# Patient Record
Sex: Female | Born: 1955 | Race: White | Hispanic: No | Marital: Married | Smoking: Former smoker
Health system: Southern US, Community
[De-identification: ages and names within clinical notes are randomized; demographics above are authoritative.]

## PROBLEM LIST (undated history)

## (undated) DIAGNOSIS — K759 Inflammatory liver disease, unspecified: Secondary | ICD-10-CM

## (undated) DIAGNOSIS — D490 Neoplasm of unspecified behavior of digestive system: Secondary | ICD-10-CM

## (undated) DIAGNOSIS — J45909 Unspecified asthma, uncomplicated: Secondary | ICD-10-CM

## (undated) DIAGNOSIS — K649 Unspecified hemorrhoids: Secondary | ICD-10-CM

## (undated) DIAGNOSIS — F419 Anxiety disorder, unspecified: Secondary | ICD-10-CM

## (undated) DIAGNOSIS — A78 Q fever: Secondary | ICD-10-CM

## (undated) DIAGNOSIS — I10 Essential (primary) hypertension: Secondary | ICD-10-CM

## (undated) DIAGNOSIS — K219 Gastro-esophageal reflux disease without esophagitis: Secondary | ICD-10-CM

## (undated) DIAGNOSIS — N183 Chronic kidney disease, stage 3 unspecified: Secondary | ICD-10-CM

## (undated) DIAGNOSIS — I493 Ventricular premature depolarization: Secondary | ICD-10-CM

## (undated) DIAGNOSIS — I2510 Atherosclerotic heart disease of native coronary artery without angina pectoris: Secondary | ICD-10-CM

## (undated) DIAGNOSIS — C801 Malignant (primary) neoplasm, unspecified: Secondary | ICD-10-CM

## (undated) DIAGNOSIS — Z9884 Bariatric surgery status: Secondary | ICD-10-CM

## (undated) DIAGNOSIS — D649 Anemia, unspecified: Secondary | ICD-10-CM

## (undated) DIAGNOSIS — E785 Hyperlipidemia, unspecified: Secondary | ICD-10-CM

## (undated) DIAGNOSIS — E039 Hypothyroidism, unspecified: Secondary | ICD-10-CM

## (undated) HISTORY — DX: Neoplasm of unspecified behavior of digestive system: D49.0

## (undated) HISTORY — DX: Unspecified asthma, uncomplicated: J45.909

## (undated) HISTORY — DX: Hyperlipidemia, unspecified: E78.5

## (undated) HISTORY — DX: Unspecified hemorrhoids: K64.9

## (undated) HISTORY — PX: BREAST SURGERY: SHX581

## (undated) HISTORY — PX: TUBAL LIGATION: SHX77

## (undated) HISTORY — PX: APPENDECTOMY: SHX54

## (undated) HISTORY — DX: Hypothyroidism, unspecified: E03.9

## (undated) HISTORY — DX: Essential (primary) hypertension: I10

## (undated) HISTORY — DX: Gastro-esophageal reflux disease without esophagitis: K21.9

## (undated) HISTORY — PX: TONSILLECTOMY: SUR1361

## (undated) HISTORY — DX: Inflammatory liver disease, unspecified: K75.9

## (undated) HISTORY — DX: Q fever: A78

---

## 1988-11-18 HISTORY — PX: ABDOMINAL HYSTERECTOMY: SHX81

## 1990-11-18 HISTORY — PX: CHOLECYSTECTOMY: SHX55

## 1998-09-05 ENCOUNTER — Other Ambulatory Visit: Admission: RE | Admit: 1998-09-05 | Discharge: 1998-09-05 | Payer: Self-pay | Admitting: *Deleted

## 1999-09-12 ENCOUNTER — Other Ambulatory Visit: Admission: RE | Admit: 1999-09-12 | Discharge: 1999-09-12 | Payer: Self-pay | Admitting: *Deleted

## 1999-11-13 ENCOUNTER — Encounter: Payer: Self-pay | Admitting: *Deleted

## 1999-11-13 ENCOUNTER — Encounter: Admission: RE | Admit: 1999-11-13 | Discharge: 1999-11-13 | Payer: Self-pay | Admitting: *Deleted

## 2000-05-13 ENCOUNTER — Encounter: Payer: Self-pay | Admitting: Gastroenterology

## 2000-05-14 ENCOUNTER — Encounter: Payer: Self-pay | Admitting: Gastroenterology

## 2000-05-14 ENCOUNTER — Encounter (INDEPENDENT_AMBULATORY_CARE_PROVIDER_SITE_OTHER): Payer: Self-pay | Admitting: Specialist

## 2000-05-14 ENCOUNTER — Other Ambulatory Visit: Admission: RE | Admit: 2000-05-14 | Discharge: 2000-05-14 | Payer: Self-pay | Admitting: Gastroenterology

## 2000-11-13 ENCOUNTER — Encounter: Admission: RE | Admit: 2000-11-13 | Discharge: 2000-11-13 | Payer: Self-pay | Admitting: *Deleted

## 2000-11-13 ENCOUNTER — Encounter: Payer: Self-pay | Admitting: *Deleted

## 2000-11-24 ENCOUNTER — Encounter: Payer: Self-pay | Admitting: *Deleted

## 2000-11-24 ENCOUNTER — Encounter: Admission: RE | Admit: 2000-11-24 | Discharge: 2000-11-24 | Payer: Self-pay | Admitting: *Deleted

## 2002-06-28 ENCOUNTER — Encounter: Payer: Self-pay | Admitting: Internal Medicine

## 2002-06-28 ENCOUNTER — Encounter: Admission: RE | Admit: 2002-06-28 | Discharge: 2002-06-28 | Payer: Self-pay | Admitting: Internal Medicine

## 2002-09-09 ENCOUNTER — Encounter: Payer: Self-pay | Admitting: Internal Medicine

## 2002-09-09 ENCOUNTER — Encounter: Admission: RE | Admit: 2002-09-09 | Discharge: 2002-09-09 | Payer: Self-pay | Admitting: Internal Medicine

## 2002-11-18 HISTORY — PX: GASTRIC BYPASS: SHX52

## 2003-07-06 ENCOUNTER — Encounter: Payer: Self-pay | Admitting: Gastroenterology

## 2003-07-19 ENCOUNTER — Encounter: Payer: Self-pay | Admitting: Internal Medicine

## 2003-07-19 ENCOUNTER — Encounter: Admission: RE | Admit: 2003-07-19 | Discharge: 2003-07-19 | Payer: Self-pay | Admitting: Internal Medicine

## 2003-11-19 HISTORY — PX: BUNIONECTOMY: SHX129

## 2004-05-24 ENCOUNTER — Encounter: Admission: RE | Admit: 2004-05-24 | Discharge: 2004-05-24 | Payer: Self-pay | Admitting: Internal Medicine

## 2005-07-12 ENCOUNTER — Encounter: Admission: RE | Admit: 2005-07-12 | Discharge: 2005-07-12 | Payer: Self-pay | Admitting: Internal Medicine

## 2006-07-14 ENCOUNTER — Encounter: Admission: RE | Admit: 2006-07-14 | Discharge: 2006-07-14 | Payer: Self-pay | Admitting: Internal Medicine

## 2007-07-21 ENCOUNTER — Encounter: Admission: RE | Admit: 2007-07-21 | Discharge: 2007-07-21 | Payer: Self-pay | Admitting: Internal Medicine

## 2007-11-19 HISTORY — PX: OTHER SURGICAL HISTORY: SHX169

## 2007-12-18 ENCOUNTER — Encounter: Admission: RE | Admit: 2007-12-18 | Discharge: 2007-12-18 | Payer: Self-pay | Admitting: Internal Medicine

## 2008-01-20 ENCOUNTER — Ambulatory Visit: Payer: Self-pay | Admitting: Gastroenterology

## 2008-01-22 ENCOUNTER — Ambulatory Visit (HOSPITAL_COMMUNITY): Admission: RE | Admit: 2008-01-22 | Discharge: 2008-01-22 | Payer: Self-pay | Admitting: Gastroenterology

## 2008-02-02 ENCOUNTER — Inpatient Hospital Stay (HOSPITAL_COMMUNITY): Admission: AD | Admit: 2008-02-02 | Discharge: 2008-02-05 | Payer: Self-pay | Admitting: Gastroenterology

## 2008-02-03 ENCOUNTER — Encounter: Payer: Self-pay | Admitting: Gastroenterology

## 2008-02-04 ENCOUNTER — Ambulatory Visit: Payer: Self-pay | Admitting: Gastroenterology

## 2008-02-22 ENCOUNTER — Encounter: Payer: Self-pay | Admitting: Gastroenterology

## 2008-02-29 ENCOUNTER — Encounter: Payer: Self-pay | Admitting: Gastroenterology

## 2008-03-16 ENCOUNTER — Ambulatory Visit: Payer: Self-pay | Admitting: Hematology and Oncology

## 2008-03-17 ENCOUNTER — Encounter: Payer: Self-pay | Admitting: Gastroenterology

## 2008-03-17 LAB — CBC WITH DIFFERENTIAL/PLATELET
BASO%: 0.5 % (ref 0.0–2.0)
EOS%: 2.8 % (ref 0.0–7.0)
MCH: 30.8 pg (ref 26.0–34.0)
MCHC: 34.2 g/dL (ref 32.0–36.0)
MONO#: 0.6 10*3/uL (ref 0.1–0.9)
RBC: 3.25 10*6/uL — ABNORMAL LOW (ref 3.70–5.32)
RDW: 15.6 % — ABNORMAL HIGH (ref 11.3–14.5)
WBC: 8.2 10*3/uL (ref 3.9–10.0)
lymph#: 1.6 10*3/uL (ref 0.9–3.3)

## 2008-03-17 LAB — COMPREHENSIVE METABOLIC PANEL
ALT: 9 U/L (ref 0–35)
AST: 12 U/L (ref 0–37)
Calcium: 8.5 mg/dL (ref 8.4–10.5)
Chloride: 105 mEq/L (ref 96–112)
Creatinine, Ser: 0.68 mg/dL (ref 0.40–1.20)
Potassium: 3.6 mEq/L (ref 3.5–5.3)
Sodium: 141 mEq/L (ref 135–145)
Total Protein: 6.2 g/dL (ref 6.0–8.3)

## 2008-03-17 LAB — CANCER ANTIGEN 19-9: CA 19-9: 20.1 U/mL (ref <35.0)

## 2008-03-24 ENCOUNTER — Encounter: Payer: Self-pay | Admitting: Gastroenterology

## 2008-03-30 ENCOUNTER — Ambulatory Visit: Admission: RE | Admit: 2008-03-30 | Discharge: 2008-05-29 | Payer: Self-pay | Admitting: Radiation Oncology

## 2008-03-31 ENCOUNTER — Encounter: Payer: Self-pay | Admitting: Gastroenterology

## 2008-04-07 ENCOUNTER — Ambulatory Visit (HOSPITAL_COMMUNITY): Admission: RE | Admit: 2008-04-07 | Discharge: 2008-04-07 | Payer: Self-pay | Admitting: Hematology and Oncology

## 2008-04-13 ENCOUNTER — Encounter: Payer: Self-pay | Admitting: Gastroenterology

## 2008-04-19 ENCOUNTER — Encounter: Payer: Self-pay | Admitting: Gastroenterology

## 2008-04-26 ENCOUNTER — Encounter: Payer: Self-pay | Admitting: Gastroenterology

## 2008-04-26 LAB — CBC WITH DIFFERENTIAL/PLATELET
BASO%: 0.9 % (ref 0.0–2.0)
LYMPH%: 11.7 % — ABNORMAL LOW (ref 14.0–48.0)
MCHC: 35 g/dL (ref 32.0–36.0)
MONO#: 0.6 10*3/uL (ref 0.1–0.9)
RBC: 4.08 10*6/uL (ref 3.70–5.32)
RDW: 12.3 % (ref 11.3–14.5)
WBC: 6.1 10*3/uL (ref 3.9–10.0)
lymph#: 0.7 10*3/uL — ABNORMAL LOW (ref 0.9–3.3)

## 2008-04-26 LAB — BASIC METABOLIC PANEL
CO2: 25 mEq/L (ref 19–32)
Calcium: 7.2 mg/dL — ABNORMAL LOW (ref 8.4–10.5)
Chloride: 108 mEq/L (ref 96–112)
Potassium: 3.1 mEq/L — ABNORMAL LOW (ref 3.5–5.3)
Sodium: 150 mEq/L — ABNORMAL HIGH (ref 135–145)

## 2008-05-03 ENCOUNTER — Ambulatory Visit: Payer: Self-pay | Admitting: Hematology and Oncology

## 2008-05-03 LAB — CBC WITH DIFFERENTIAL/PLATELET
Basophils Absolute: 0 10*3/uL (ref 0.0–0.1)
Eosinophils Absolute: 0.5 10*3/uL (ref 0.0–0.5)
HCT: 34.2 % — ABNORMAL LOW (ref 34.8–46.6)
HGB: 12.1 g/dL (ref 11.6–15.9)
LYMPH%: 10.1 % — ABNORMAL LOW (ref 14.0–48.0)
MCV: 87.1 fL (ref 81.0–101.0)
MONO#: 0.6 10*3/uL (ref 0.1–0.9)
MONO%: 12.8 % (ref 0.0–13.0)
NEUT#: 3 10*3/uL (ref 1.5–6.5)
Platelets: 192 10*3/uL (ref 145–400)
RBC: 3.92 10*6/uL (ref 3.70–5.32)
WBC: 4.5 10*3/uL (ref 3.9–10.0)

## 2008-05-10 ENCOUNTER — Encounter: Payer: Self-pay | Admitting: Gastroenterology

## 2008-05-10 LAB — CBC WITH DIFFERENTIAL/PLATELET
BASO%: 0.6 % (ref 0.0–2.0)
Eosinophils Absolute: 0.5 10*3/uL (ref 0.0–0.5)
HCT: 38.8 % (ref 34.8–46.6)
LYMPH%: 7.6 % — ABNORMAL LOW (ref 14.0–48.0)
MCHC: 34.9 g/dL (ref 32.0–36.0)
MCV: 87.2 fL (ref 81.0–101.0)
MONO#: 0.9 10*3/uL (ref 0.1–0.9)
MONO%: 18.1 % — ABNORMAL HIGH (ref 0.0–13.0)
NEUT%: 63.1 % (ref 39.6–76.8)
Platelets: 145 10*3/uL (ref 145–400)
RBC: 4.45 10*6/uL (ref 3.70–5.32)
WBC: 4.9 10*3/uL (ref 3.9–10.0)

## 2008-05-17 LAB — CBC WITH DIFFERENTIAL/PLATELET
BASO%: 0.4 % (ref 0.0–2.0)
EOS%: 8.9 % — ABNORMAL HIGH (ref 0.0–7.0)
HCT: 33 % — ABNORMAL LOW (ref 34.8–46.6)
LYMPH%: 6.4 % — ABNORMAL LOW (ref 14.0–48.0)
MCH: 30.8 pg (ref 26.0–34.0)
MCHC: 35.8 g/dL (ref 32.0–36.0)
NEUT%: 66.3 % (ref 39.6–76.8)
Platelets: 169 10*3/uL (ref 145–400)
RBC: 3.84 10*6/uL (ref 3.70–5.32)

## 2008-06-06 ENCOUNTER — Ambulatory Visit: Payer: Self-pay | Admitting: Hematology and Oncology

## 2008-06-08 ENCOUNTER — Ambulatory Visit (HOSPITAL_COMMUNITY): Admission: RE | Admit: 2008-06-08 | Discharge: 2008-06-08 | Payer: Self-pay | Admitting: Hematology and Oncology

## 2008-06-09 ENCOUNTER — Encounter: Payer: Self-pay | Admitting: Gastroenterology

## 2008-06-09 LAB — CBC WITH DIFFERENTIAL/PLATELET
Basophils Absolute: 0 10*3/uL (ref 0.0–0.1)
Eosinophils Absolute: 0.2 10*3/uL (ref 0.0–0.5)
HGB: 12.8 g/dL (ref 11.6–15.9)
LYMPH%: 16 % (ref 14.0–48.0)
MONO#: 0.5 10*3/uL (ref 0.1–0.9)
NEUT#: 2.5 10*3/uL (ref 1.5–6.5)
Platelets: 211 10*3/uL (ref 145–400)
RBC: 4.21 10*6/uL (ref 3.70–5.32)
RDW: 14.6 % — ABNORMAL HIGH (ref 11.3–14.5)
WBC: 3.8 10*3/uL — ABNORMAL LOW (ref 3.9–10.0)

## 2008-06-09 LAB — COMPREHENSIVE METABOLIC PANEL
Albumin: 4 g/dL (ref 3.5–5.2)
BUN: 9 mg/dL (ref 6–23)
CO2: 26 mEq/L (ref 19–32)
Calcium: 9 mg/dL (ref 8.4–10.5)
Glucose, Bld: 157 mg/dL — ABNORMAL HIGH (ref 70–99)
Potassium: 4 mEq/L (ref 3.5–5.3)
Sodium: 143 mEq/L (ref 135–145)
Total Protein: 6.1 g/dL (ref 6.0–8.3)

## 2008-06-09 LAB — CANCER ANTIGEN 19-9: CA 19-9: 16.7 U/mL (ref <35.0)

## 2008-06-21 ENCOUNTER — Encounter: Payer: Self-pay | Admitting: Gastroenterology

## 2008-06-22 LAB — CBC WITH DIFFERENTIAL/PLATELET
BASO%: 0.5 % (ref 0.0–2.0)
EOS%: 0.9 % (ref 0.0–7.0)
MCH: 31.5 pg (ref 26.0–34.0)
MCHC: 35.7 g/dL (ref 32.0–36.0)
MONO#: 0.2 10*3/uL (ref 0.1–0.9)
NEUT%: 69.2 % (ref 39.6–76.8)
RBC: 3.18 10*6/uL — ABNORMAL LOW (ref 3.70–5.32)
RDW: 14.8 % — ABNORMAL HIGH (ref 11.3–14.5)
WBC: 2.4 10*3/uL — ABNORMAL LOW (ref 3.9–10.0)
lymph#: 0.5 10*3/uL — ABNORMAL LOW (ref 0.9–3.3)

## 2008-07-06 LAB — CBC WITH DIFFERENTIAL/PLATELET
BASO%: 1.5 % (ref 0.0–2.0)
Basophils Absolute: 0.1 10e3/uL (ref 0.0–0.1)
EOS%: 2.3 % (ref 0.0–7.0)
Eosinophils Absolute: 0.1 10e3/uL (ref 0.0–0.5)
HCT: 31.2 % — ABNORMAL LOW (ref 34.8–46.6)
HGB: 11 g/dL — ABNORMAL LOW (ref 11.6–15.9)
LYMPH%: 15.6 % (ref 14.0–48.0)
MCH: 31.4 pg (ref 26.0–34.0)
MCHC: 35.2 g/dL (ref 32.0–36.0)
MCV: 89 fL (ref 81.0–101.0)
MONO#: 0.7 10e3/uL (ref 0.1–0.9)
MONO%: 18.1 % — ABNORMAL HIGH (ref 0.0–13.0)
NEUT#: 2.3 10e3/uL (ref 1.5–6.5)
NEUT%: 62.4 % (ref 39.6–76.8)
Platelets: 389 10e3/uL (ref 145–400)
RBC: 3.5 10e6/uL — ABNORMAL LOW (ref 3.70–5.32)
RDW: 15.4 % — ABNORMAL HIGH (ref 11.3–14.5)
WBC: 3.7 10e3/uL — ABNORMAL LOW (ref 3.9–10.0)
lymph#: 0.6 10e3/uL — ABNORMAL LOW (ref 0.9–3.3)

## 2008-07-13 ENCOUNTER — Encounter: Payer: Self-pay | Admitting: Gastroenterology

## 2008-07-13 LAB — CBC WITH DIFFERENTIAL/PLATELET
Basophils Absolute: 0 10*3/uL (ref 0.0–0.1)
Eosinophils Absolute: 0 10*3/uL (ref 0.0–0.5)
HCT: 29.1 % — ABNORMAL LOW (ref 34.8–46.6)
HGB: 10.3 g/dL — ABNORMAL LOW (ref 11.6–15.9)
MCV: 92.2 fL (ref 81.0–101.0)
MONO%: 19.1 % — ABNORMAL HIGH (ref 0.0–13.0)
NEUT#: 0.6 10*3/uL — ABNORMAL LOW (ref 1.5–6.5)
NEUT%: 41.4 % (ref 39.6–76.8)
RDW: 15.5 % — ABNORMAL HIGH (ref 11.3–14.5)
lymph#: 0.5 10*3/uL — ABNORMAL LOW (ref 0.9–3.3)

## 2008-07-13 LAB — COMPREHENSIVE METABOLIC PANEL WITH GFR
ALT: 30 U/L (ref 0–35)
AST: 36 U/L (ref 0–37)
Albumin: 3.6 g/dL (ref 3.5–5.2)
Alkaline Phosphatase: 126 U/L — ABNORMAL HIGH (ref 39–117)
BUN: 8 mg/dL (ref 6–23)
CO2: 24 meq/L (ref 19–32)
Calcium: 9 mg/dL (ref 8.4–10.5)
Chloride: 106 meq/L (ref 96–112)
Creatinine, Ser: 0.73 mg/dL (ref 0.40–1.20)
Glucose, Bld: 169 mg/dL — ABNORMAL HIGH (ref 70–99)
Potassium: 3.3 meq/L — ABNORMAL LOW (ref 3.5–5.3)
Sodium: 140 meq/L (ref 135–145)
Total Bilirubin: 0.3 mg/dL (ref 0.3–1.2)
Total Protein: 6.1 g/dL (ref 6.0–8.3)

## 2008-07-20 LAB — CBC WITH DIFFERENTIAL/PLATELET
Basophils Absolute: 0.1 10*3/uL (ref 0.0–0.1)
EOS%: 1.3 % (ref 0.0–7.0)
Eosinophils Absolute: 0.1 10*3/uL (ref 0.0–0.5)
HCT: 32.4 % — ABNORMAL LOW (ref 34.8–46.6)
HGB: 11.7 g/dL (ref 11.6–15.9)
MCH: 32.6 pg (ref 26.0–34.0)
MCV: 90.2 fL (ref 81.0–101.0)
MONO%: 10.7 % (ref 0.0–13.0)
NEUT%: 74 % (ref 39.6–76.8)
Platelets: 209 10*3/uL (ref 145–400)

## 2008-07-21 ENCOUNTER — Ambulatory Visit: Payer: Self-pay | Admitting: Hematology and Oncology

## 2008-07-27 LAB — CBC WITH DIFFERENTIAL/PLATELET
Basophils Absolute: 0 10*3/uL (ref 0.0–0.1)
EOS%: 1.4 % (ref 0.0–7.0)
LYMPH%: 24.4 % (ref 14.0–48.0)
MCH: 32 pg (ref 26.0–34.0)
MCV: 89.5 fL (ref 81.0–101.0)
MONO%: 10.1 % (ref 0.0–13.0)
Platelets: 189 10*3/uL (ref 145–400)
RBC: 3.44 10*6/uL — ABNORMAL LOW (ref 3.70–5.32)
RDW: 13.3 % (ref 11.3–14.5)

## 2008-08-03 LAB — CBC WITH DIFFERENTIAL/PLATELET
BASO%: 2.3 % — ABNORMAL HIGH (ref 0.0–2.0)
Eosinophils Absolute: 0 10*3/uL (ref 0.0–0.5)
LYMPH%: 10.1 % — ABNORMAL LOW (ref 14.0–48.0)
MCHC: 35.7 g/dL (ref 32.0–36.0)
MCV: 90.8 fL (ref 81.0–101.0)
MONO%: 10.2 % (ref 0.0–13.0)
NEUT#: 4.9 10*3/uL (ref 1.5–6.5)
RBC: 3.02 10*6/uL — ABNORMAL LOW (ref 3.70–5.32)
RDW: 14.1 % (ref 11.3–14.5)
WBC: 6.4 10*3/uL (ref 3.9–10.0)

## 2008-08-03 LAB — CANCER ANTIGEN 19-9: CA 19-9: 18.2 U/mL (ref <35.0)

## 2008-08-10 LAB — BASIC METABOLIC PANEL
CO2: 25 mEq/L (ref 19–32)
Chloride: 105 mEq/L (ref 96–112)
Creatinine, Ser: 0.85 mg/dL (ref 0.40–1.20)
Potassium: 3.5 mEq/L (ref 3.5–5.3)
Sodium: 138 mEq/L (ref 135–145)

## 2008-08-10 LAB — CBC WITH DIFFERENTIAL/PLATELET
BASO%: 0 % (ref 0.0–2.0)
EOS%: 0 % (ref 0.0–7.0)
HCT: 25.9 % — ABNORMAL LOW (ref 34.8–46.6)
MCH: 33.2 pg (ref 26.0–34.0)
MCHC: 35 g/dL (ref 32.0–36.0)
MONO#: 0.4 10*3/uL (ref 0.1–0.9)
RBC: 2.74 10*6/uL — ABNORMAL LOW (ref 3.70–5.32)
RDW: 15.5 % — ABNORMAL HIGH (ref 11.3–14.5)
WBC: 9.6 10*3/uL (ref 3.9–10.0)
lymph#: 0.1 10*3/uL — ABNORMAL LOW (ref 0.9–3.3)

## 2008-08-11 ENCOUNTER — Encounter: Admission: RE | Admit: 2008-08-11 | Discharge: 2008-08-11 | Payer: Self-pay | Admitting: Internal Medicine

## 2008-08-17 ENCOUNTER — Encounter: Payer: Self-pay | Admitting: Gastroenterology

## 2008-08-17 LAB — CBC WITH DIFFERENTIAL/PLATELET
BASO%: 2.1 % — ABNORMAL HIGH (ref 0.0–2.0)
Basophils Absolute: 0.2 10*3/uL — ABNORMAL HIGH (ref 0.0–0.1)
EOS%: 1.4 % (ref 0.0–7.0)
HCT: 27.8 % — ABNORMAL LOW (ref 34.8–46.6)
HGB: 10 g/dL — ABNORMAL LOW (ref 11.6–15.9)
MCH: 32.9 pg (ref 26.0–34.0)
MCHC: 36.1 g/dL — ABNORMAL HIGH (ref 32.0–36.0)
MONO#: 0.9 10*3/uL (ref 0.1–0.9)
NEUT%: 76.4 % (ref 39.6–76.8)
RDW: 16 % — ABNORMAL HIGH (ref 11.3–14.5)
WBC: 7.3 10*3/uL (ref 3.9–10.0)
lymph#: 0.6 10*3/uL — ABNORMAL LOW (ref 0.9–3.3)

## 2008-08-17 LAB — COMPREHENSIVE METABOLIC PANEL
ALT: 17 U/L (ref 0–35)
AST: 27 U/L (ref 0–37)
Albumin: 3.4 g/dL — ABNORMAL LOW (ref 3.5–5.2)
CO2: 23 mEq/L (ref 19–32)
Calcium: 8.4 mg/dL (ref 8.4–10.5)
Chloride: 108 mEq/L (ref 96–112)
Creatinine, Ser: 0.67 mg/dL (ref 0.40–1.20)
Potassium: 3.8 mEq/L (ref 3.5–5.3)
Total Protein: 5.5 g/dL — ABNORMAL LOW (ref 6.0–8.3)

## 2008-08-24 LAB — CBC WITH DIFFERENTIAL/PLATELET
Basophils Absolute: 0.1 10*3/uL (ref 0.0–0.1)
EOS%: 0.3 % (ref 0.0–7.0)
Eosinophils Absolute: 0 10*3/uL (ref 0.0–0.5)
HCT: 29.7 % — ABNORMAL LOW (ref 34.8–46.6)
HGB: 10.6 g/dL — ABNORMAL LOW (ref 11.6–15.9)
MCH: 32.9 pg (ref 26.0–34.0)
MCV: 92.5 fL (ref 81.0–101.0)
MONO%: 14 % — ABNORMAL HIGH (ref 0.0–13.0)
NEUT#: 2.2 10*3/uL (ref 1.5–6.5)
NEUT%: 70.6 % (ref 39.6–76.8)
Platelets: 401 10*3/uL — ABNORMAL HIGH (ref 145–400)
RDW: 14.7 % — ABNORMAL HIGH (ref 11.3–14.5)

## 2008-08-30 ENCOUNTER — Encounter: Payer: Self-pay | Admitting: Gastroenterology

## 2008-08-30 LAB — CBC WITH DIFFERENTIAL/PLATELET
Basophils Absolute: 0 10*3/uL (ref 0.0–0.1)
Eosinophils Absolute: 0.1 10*3/uL (ref 0.0–0.5)
HCT: 31.5 % — ABNORMAL LOW (ref 34.8–46.6)
LYMPH%: 2.2 % — ABNORMAL LOW (ref 14.0–48.0)
MONO#: 1.4 10*3/uL — ABNORMAL HIGH (ref 0.1–0.9)
NEUT#: 24 10*3/uL — ABNORMAL HIGH (ref 1.5–6.5)
NEUT%: 92 % — ABNORMAL HIGH (ref 39.6–76.8)
Platelets: 216 10*3/uL (ref 145–400)
WBC: 26.1 10*3/uL — ABNORMAL HIGH (ref 3.9–10.0)

## 2008-08-30 LAB — COMPREHENSIVE METABOLIC PANEL
BUN: 8 mg/dL (ref 6–23)
CO2: 26 mEq/L (ref 19–32)
Creatinine, Ser: 0.92 mg/dL (ref 0.40–1.20)
Glucose, Bld: 154 mg/dL — ABNORMAL HIGH (ref 70–99)
Sodium: 140 mEq/L (ref 135–145)
Total Bilirubin: 0.4 mg/dL (ref 0.3–1.2)
Total Protein: 5.8 g/dL — ABNORMAL LOW (ref 6.0–8.3)

## 2008-09-08 ENCOUNTER — Ambulatory Visit: Payer: Self-pay | Admitting: Hematology and Oncology

## 2008-09-12 ENCOUNTER — Ambulatory Visit (HOSPITAL_COMMUNITY): Admission: RE | Admit: 2008-09-12 | Discharge: 2008-09-12 | Payer: Self-pay | Admitting: Hematology and Oncology

## 2008-09-12 LAB — COMPREHENSIVE METABOLIC PANEL
Albumin: 3.4 g/dL — ABNORMAL LOW (ref 3.5–5.2)
CO2: 28 mEq/L (ref 19–32)
Calcium: 8.6 mg/dL (ref 8.4–10.5)
Glucose, Bld: 143 mg/dL — ABNORMAL HIGH (ref 70–99)
Potassium: 3.6 mEq/L (ref 3.5–5.3)
Sodium: 137 mEq/L (ref 135–145)
Total Bilirubin: 0.7 mg/dL (ref 0.3–1.2)
Total Protein: 6.4 g/dL (ref 6.0–8.3)

## 2008-09-12 LAB — CBC WITH DIFFERENTIAL/PLATELET
Eosinophils Absolute: 0.2 10*3/uL (ref 0.0–0.5)
LYMPH%: 5.5 % — ABNORMAL LOW (ref 14.0–48.0)
MONO#: 1.1 10*3/uL — ABNORMAL HIGH (ref 0.1–0.9)
NEUT#: 4.1 10*3/uL (ref 1.5–6.5)
Platelets: 382 10*3/uL (ref 145–400)
RBC: 3.08 10*6/uL — ABNORMAL LOW (ref 3.70–5.32)
WBC: 5.8 10*3/uL (ref 3.9–10.0)

## 2008-09-12 LAB — CANCER ANTIGEN 19-9: CA 19-9: 15.6 U/mL (ref <35.0)

## 2008-09-15 ENCOUNTER — Encounter: Payer: Self-pay | Admitting: Gastroenterology

## 2008-11-07 ENCOUNTER — Ambulatory Visit: Payer: Self-pay | Admitting: Hematology and Oncology

## 2008-11-09 ENCOUNTER — Ambulatory Visit (HOSPITAL_COMMUNITY): Admission: RE | Admit: 2008-11-09 | Discharge: 2008-11-09 | Payer: Self-pay | Admitting: Hematology and Oncology

## 2008-11-09 LAB — COMPREHENSIVE METABOLIC PANEL
ALT: 54 U/L — ABNORMAL HIGH (ref 0–35)
AST: 136 U/L — ABNORMAL HIGH (ref 0–37)
Alkaline Phosphatase: 266 U/L — ABNORMAL HIGH (ref 39–117)
BUN: 5 mg/dL — ABNORMAL LOW (ref 6–23)
Calcium: 8.7 mg/dL (ref 8.4–10.5)
Chloride: 103 mEq/L (ref 96–112)
Creatinine, Ser: 0.68 mg/dL (ref 0.40–1.20)
Total Bilirubin: 0.6 mg/dL (ref 0.3–1.2)

## 2008-11-09 LAB — CBC WITH DIFFERENTIAL/PLATELET
BASO%: 0 % (ref 0.0–2.0)
Basophils Absolute: 0 10*3/uL (ref 0.0–0.1)
EOS%: 0.2 % (ref 0.0–7.0)
HCT: 39.7 % (ref 34.8–46.6)
HGB: 13.5 g/dL (ref 11.6–15.9)
LYMPH%: 3.4 % — ABNORMAL LOW (ref 14.0–48.0)
MCH: 31 pg (ref 26.0–34.0)
MCHC: 34.1 g/dL (ref 32.0–36.0)
MCV: 90.8 fL (ref 81.0–101.0)
MONO%: 1.5 % (ref 0.0–13.0)
NEUT%: 94.9 % — ABNORMAL HIGH (ref 39.6–76.8)
lymph#: 0.3 10*3/uL — ABNORMAL LOW (ref 0.9–3.3)

## 2009-01-04 ENCOUNTER — Ambulatory Visit: Payer: Self-pay | Admitting: Hematology and Oncology

## 2009-01-18 ENCOUNTER — Encounter: Admission: RE | Admit: 2009-01-18 | Discharge: 2009-01-18 | Payer: Self-pay | Admitting: Hematology and Oncology

## 2009-01-18 LAB — COMPREHENSIVE METABOLIC PANEL
AST: 38 U/L — ABNORMAL HIGH (ref 0–37)
BUN: 4 mg/dL — ABNORMAL LOW (ref 6–23)
Calcium: 8.2 mg/dL — ABNORMAL LOW (ref 8.4–10.5)
Chloride: 100 mEq/L (ref 96–112)
Creatinine, Ser: 0.89 mg/dL (ref 0.40–1.20)

## 2009-01-18 LAB — CBC WITH DIFFERENTIAL/PLATELET
BASO%: 0.1 % (ref 0.0–2.0)
EOS%: 2.7 % (ref 0.0–7.0)
HCT: 34 % — ABNORMAL LOW (ref 34.8–46.6)
MCH: 30.4 pg (ref 25.1–34.0)
MCHC: 34.2 g/dL (ref 31.5–36.0)
NEUT%: 78.3 % — ABNORMAL HIGH (ref 38.4–76.8)
lymph#: 0.5 10*3/uL — ABNORMAL LOW (ref 0.9–3.3)

## 2009-01-21 LAB — CANCER ANTIGEN 19-9: CA 19-9: 7441.2 U/mL — ABNORMAL HIGH (ref <35.0)

## 2009-01-25 LAB — CANCER ANTIGEN 19-9: CA 19-9: 1608.3 U/mL — ABNORMAL HIGH (ref <35.0)

## 2009-01-30 LAB — CANCER ANTIGEN 19-9: CA 19-9: 364.6 U/mL — ABNORMAL HIGH (ref <35.0)

## 2009-02-15 LAB — CANCER ANTIGEN 19-9: CA 19-9: 216.2 U/mL — ABNORMAL HIGH (ref <35.0)

## 2009-02-17 ENCOUNTER — Ambulatory Visit: Payer: Self-pay | Admitting: Hematology and Oncology

## 2009-02-17 LAB — CBC WITH DIFFERENTIAL/PLATELET
Basophils Absolute: 0 10*3/uL (ref 0.0–0.1)
Eosinophils Absolute: 0.1 10*3/uL (ref 0.0–0.5)
HCT: 34 % — ABNORMAL LOW (ref 34.8–46.6)
HGB: 11.6 g/dL (ref 11.6–15.9)
LYMPH%: 8.1 % — ABNORMAL LOW (ref 14.0–49.7)
MCV: 88.6 fL (ref 79.5–101.0)
MONO#: 0.7 10*3/uL (ref 0.1–0.9)
MONO%: 9.6 % (ref 0.0–14.0)
NEUT#: 5.9 10*3/uL (ref 1.5–6.5)
NEUT%: 81.5 % — ABNORMAL HIGH (ref 38.4–76.8)
Platelets: 231 10*3/uL (ref 145–400)
RBC: 3.83 10*6/uL (ref 3.70–5.45)
WBC: 7.2 10*3/uL (ref 3.9–10.3)

## 2009-02-17 LAB — BASIC METABOLIC PANEL
BUN: 13 mg/dL (ref 6–23)
CO2: 29 mEq/L (ref 19–32)
Calcium: 8.6 mg/dL (ref 8.4–10.5)
Glucose, Bld: 282 mg/dL — ABNORMAL HIGH (ref 70–99)
Sodium: 137 mEq/L (ref 135–145)

## 2009-02-23 LAB — CBC WITH DIFFERENTIAL/PLATELET
BASO%: 0.2 % (ref 0.0–2.0)
EOS%: 0.2 % (ref 0.0–7.0)
HCT: 32.3 % — ABNORMAL LOW (ref 34.8–46.6)
LYMPH%: 12.1 % — ABNORMAL LOW (ref 14.0–49.7)
MCH: 29.2 pg (ref 25.1–34.0)
MCHC: 33.7 g/dL (ref 31.5–36.0)
NEUT%: 77.6 % — ABNORMAL HIGH (ref 38.4–76.8)
Platelets: 198 10*3/uL (ref 145–400)
RBC: 3.73 10*6/uL (ref 3.70–5.45)
WBC: 5 10*3/uL (ref 3.9–10.3)
lymph#: 0.6 10*3/uL — ABNORMAL LOW (ref 0.9–3.3)

## 2009-03-02 LAB — CBC WITH DIFFERENTIAL/PLATELET
BASO%: 0.4 % (ref 0.0–2.0)
Basophils Absolute: 0 10*3/uL (ref 0.0–0.1)
HCT: 30.1 % — ABNORMAL LOW (ref 34.8–46.6)
HGB: 9.9 g/dL — ABNORMAL LOW (ref 11.6–15.9)
MCHC: 32.9 g/dL (ref 31.5–36.0)
MONO#: 0.4 10*3/uL (ref 0.1–0.9)
NEUT%: 58 % (ref 38.4–76.8)
RDW: 13.3 % (ref 11.2–14.5)
WBC: 2.6 10*3/uL — ABNORMAL LOW (ref 3.9–10.3)
lymph#: 0.7 10*3/uL — ABNORMAL LOW (ref 0.9–3.3)

## 2009-03-08 LAB — COMPREHENSIVE METABOLIC PANEL
ALT: 52 U/L — ABNORMAL HIGH (ref 0–35)
Alkaline Phosphatase: 361 U/L — ABNORMAL HIGH (ref 39–117)
Sodium: 133 mEq/L — ABNORMAL LOW (ref 135–145)
Total Bilirubin: 0.4 mg/dL (ref 0.3–1.2)
Total Protein: 6.1 g/dL (ref 6.0–8.3)

## 2009-03-08 LAB — CBC WITH DIFFERENTIAL/PLATELET
BASO%: 0.6 % (ref 0.0–2.0)
HCT: 27.5 % — ABNORMAL LOW (ref 34.8–46.6)
HGB: 9.5 g/dL — ABNORMAL LOW (ref 11.6–15.9)
LYMPH%: 34.6 % (ref 14.0–49.7)
MCH: 30.7 pg (ref 25.1–34.0)
MCHC: 34.5 g/dL (ref 31.5–36.0)
MONO#: 0.1 10*3/uL (ref 0.1–0.9)
MONO%: 13.6 % (ref 0.0–14.0)
NEUT#: 0.6 10*3/uL — ABNORMAL LOW (ref 1.5–6.5)
RBC: 3.09 10*6/uL — ABNORMAL LOW (ref 3.70–5.45)

## 2009-03-16 LAB — CBC WITH DIFFERENTIAL/PLATELET
BASO%: 0.6 % (ref 0.0–2.0)
EOS%: 0.8 % (ref 0.0–7.0)
HCT: 36.4 % (ref 34.8–46.6)
LYMPH%: 19.2 % (ref 14.0–49.7)
MCH: 30.2 pg (ref 25.1–34.0)
MCHC: 32.1 g/dL (ref 31.5–36.0)
MCV: 93.8 fL (ref 79.5–101.0)
MONO#: 1.1 10*3/uL — ABNORMAL HIGH (ref 0.1–0.9)
NEUT%: 57.4 % (ref 38.4–76.8)
Platelets: 289 10*3/uL (ref 145–400)

## 2009-03-23 LAB — CBC WITH DIFFERENTIAL/PLATELET
BASO%: 2 % (ref 0.0–2.0)
EOS%: 0 % (ref 0.0–7.0)
HCT: 30.8 % — ABNORMAL LOW (ref 34.8–46.6)
LYMPH%: 33.5 % (ref 14.0–49.7)
MCH: 30.1 pg (ref 25.1–34.0)
MCHC: 32.5 g/dL (ref 31.5–36.0)
MCV: 92.8 fL (ref 79.5–101.0)
MONO#: 0.5 10*3/uL (ref 0.1–0.9)
NEUT%: 40.6 % (ref 38.4–76.8)
Platelets: 264 10*3/uL (ref 145–400)

## 2009-03-30 LAB — CBC WITH DIFFERENTIAL/PLATELET
Basophils Absolute: 0 10*3/uL (ref 0.0–0.1)
EOS%: 3 % (ref 0.0–7.0)
HGB: 11 g/dL — ABNORMAL LOW (ref 11.6–15.9)
MCH: 30.1 pg (ref 25.1–34.0)
MCV: 93.7 fL (ref 79.5–101.0)
MONO%: 15.3 % — ABNORMAL HIGH (ref 0.0–14.0)
RDW: 17.2 % — ABNORMAL HIGH (ref 11.2–14.5)

## 2009-04-10 ENCOUNTER — Ambulatory Visit: Payer: Self-pay | Admitting: Hematology and Oncology

## 2009-04-12 LAB — COMPREHENSIVE METABOLIC PANEL
AST: 41 U/L — ABNORMAL HIGH (ref 0–37)
Albumin: 3.3 g/dL — ABNORMAL LOW (ref 3.5–5.2)
Alkaline Phosphatase: 303 U/L — ABNORMAL HIGH (ref 39–117)
BUN: 7 mg/dL (ref 6–23)
Creatinine, Ser: 0.85 mg/dL (ref 0.40–1.20)
Glucose, Bld: 303 mg/dL — ABNORMAL HIGH (ref 70–99)
Potassium: 4.7 mEq/L (ref 3.5–5.3)
Total Bilirubin: 0.3 mg/dL (ref 0.3–1.2)

## 2009-04-12 LAB — CBC WITH DIFFERENTIAL/PLATELET
BASO%: 0 % (ref 0.0–2.0)
EOS%: 0.2 % (ref 0.0–7.0)
MCH: 31.8 pg (ref 25.1–34.0)
MCHC: 34 g/dL (ref 31.5–36.0)
MCV: 93.5 fL (ref 79.5–101.0)
MONO%: 6.6 % (ref 0.0–14.0)
NEUT#: 8.2 10*3/uL — ABNORMAL HIGH (ref 1.5–6.5)
RBC: 3.73 10*6/uL (ref 3.70–5.45)
RDW: 19.6 % — ABNORMAL HIGH (ref 11.2–14.5)

## 2009-04-20 LAB — CBC WITH DIFFERENTIAL/PLATELET
BASO%: 0.5 % (ref 0.0–2.0)
EOS%: 0 % (ref 0.0–7.0)
HCT: 36 % (ref 34.8–46.6)
LYMPH%: 10.6 % — ABNORMAL LOW (ref 14.0–49.7)
MCH: 30.7 pg (ref 25.1–34.0)
MCHC: 32.5 g/dL (ref 31.5–36.0)
MONO#: 0.2 10*3/uL (ref 0.1–0.9)
NEUT%: 80.7 % — ABNORMAL HIGH (ref 38.4–76.8)
Platelets: 187 10*3/uL (ref 145–400)

## 2009-04-27 LAB — CBC WITH DIFFERENTIAL/PLATELET
BASO%: 0.6 % (ref 0.0–2.0)
EOS%: 0.2 % (ref 0.0–7.0)
HCT: 37.4 % (ref 34.8–46.6)
LYMPH%: 10.5 % — ABNORMAL LOW (ref 14.0–49.7)
MCH: 31.1 pg (ref 25.1–34.0)
MCHC: 32.9 g/dL (ref 31.5–36.0)
MCV: 94.7 fL (ref 79.5–101.0)
MONO%: 9.9 % (ref 0.0–14.0)
NEUT%: 78.8 % — ABNORMAL HIGH (ref 38.4–76.8)
lymph#: 0.5 10*3/uL — ABNORMAL LOW (ref 0.9–3.3)

## 2009-05-10 LAB — COMPREHENSIVE METABOLIC PANEL
ALT: 30 U/L (ref 0–35)
AST: 20 U/L (ref 0–37)
Alkaline Phosphatase: 297 U/L — ABNORMAL HIGH (ref 39–117)
Creatinine, Ser: 0.86 mg/dL (ref 0.40–1.20)
Total Bilirubin: 0.3 mg/dL (ref 0.3–1.2)

## 2009-05-10 LAB — CBC WITH DIFFERENTIAL/PLATELET
BASO%: 0.3 % (ref 0.0–2.0)
LYMPH%: 17.2 % (ref 14.0–49.7)
MCHC: 33 g/dL (ref 31.5–36.0)
MCV: 95.9 fL (ref 79.5–101.0)
MONO%: 24.3 % — ABNORMAL HIGH (ref 0.0–14.0)
Platelets: 257 10*3/uL (ref 145–400)
RBC: 3.41 10*6/uL — ABNORMAL LOW (ref 3.70–5.45)
RDW: 16.5 % — ABNORMAL HIGH (ref 11.2–14.5)
WBC: 3.7 10*3/uL — ABNORMAL LOW (ref 3.9–10.3)

## 2009-05-10 LAB — CANCER ANTIGEN 19-9: CA 19-9: 59.9 U/mL — ABNORMAL HIGH (ref <35.0)

## 2009-05-16 ENCOUNTER — Encounter (INDEPENDENT_AMBULATORY_CARE_PROVIDER_SITE_OTHER): Payer: Self-pay | Admitting: Internal Medicine

## 2009-05-16 ENCOUNTER — Ambulatory Visit: Payer: Self-pay | Admitting: Vascular Surgery

## 2009-05-16 ENCOUNTER — Ambulatory Visit (HOSPITAL_COMMUNITY): Admission: RE | Admit: 2009-05-16 | Discharge: 2009-05-16 | Payer: Self-pay | Admitting: Internal Medicine

## 2009-05-16 ENCOUNTER — Ambulatory Visit: Payer: Self-pay | Admitting: Hematology and Oncology

## 2009-05-18 LAB — CBC WITH DIFFERENTIAL/PLATELET
BASO%: 0.5 % (ref 0.0–2.0)
EOS%: 0.5 % (ref 0.0–7.0)
LYMPH%: 32.3 % (ref 14.0–49.7)
MCH: 31.9 pg (ref 25.1–34.0)
MCHC: 34.1 g/dL (ref 31.5–36.0)
MCV: 93.5 fL (ref 79.5–101.0)
MONO#: 0.5 10*3/uL (ref 0.1–0.9)
MONO%: 21.4 % — ABNORMAL HIGH (ref 0.0–14.0)
Platelets: 296 10*3/uL (ref 145–400)
RBC: 3.54 10*6/uL — ABNORMAL LOW (ref 3.70–5.45)
WBC: 2.2 10*3/uL — ABNORMAL LOW (ref 3.9–10.3)

## 2009-05-25 LAB — CBC WITH DIFFERENTIAL/PLATELET
BASO%: 1.1 % (ref 0.0–2.0)
EOS%: 0 % (ref 0.0–7.0)
MCHC: 33.9 g/dL (ref 31.5–36.0)
MONO#: 1 10*3/uL — ABNORMAL HIGH (ref 0.1–0.9)
RBC: 3.43 10*6/uL — ABNORMAL LOW (ref 3.70–5.45)
RDW: 15.7 % — ABNORMAL HIGH (ref 11.2–14.5)
WBC: 3.7 10*3/uL — ABNORMAL LOW (ref 3.9–10.3)
lymph#: 0.7 10*3/uL — ABNORMAL LOW (ref 0.9–3.3)
nRBC: 1 % — ABNORMAL HIGH (ref 0–0)

## 2009-06-01 ENCOUNTER — Ambulatory Visit (HOSPITAL_COMMUNITY): Admission: RE | Admit: 2009-06-01 | Discharge: 2009-06-01 | Payer: Self-pay | Admitting: Hematology and Oncology

## 2009-06-07 LAB — CBC WITH DIFFERENTIAL/PLATELET
BASO%: 0.2 % (ref 0.0–2.0)
Eosinophils Absolute: 0.1 10*3/uL (ref 0.0–0.5)
MCHC: 33 g/dL (ref 31.5–36.0)
MONO#: 1.7 10*3/uL — ABNORMAL HIGH (ref 0.1–0.9)
MONO%: 27.1 % — ABNORMAL HIGH (ref 0.0–14.0)
NEUT#: 4 10*3/uL (ref 1.5–6.5)
RBC: 3.21 10*6/uL — ABNORMAL LOW (ref 3.70–5.45)
RDW: 17.2 % — ABNORMAL HIGH (ref 11.2–14.5)
WBC: 6.2 10*3/uL (ref 3.9–10.3)
nRBC: 0 % (ref 0–0)

## 2009-06-07 LAB — COMPREHENSIVE METABOLIC PANEL
ALT: 17 U/L (ref 0–35)
AST: 23 U/L (ref 0–37)
CO2: 24 mEq/L (ref 19–32)
Chloride: 107 mEq/L (ref 96–112)
Creatinine, Ser: 1 mg/dL (ref 0.40–1.20)
Sodium: 139 mEq/L (ref 135–145)
Total Bilirubin: 0.4 mg/dL (ref 0.3–1.2)
Total Protein: 5 g/dL — ABNORMAL LOW (ref 6.0–8.3)

## 2009-06-07 LAB — CANCER ANTIGEN 19-9: CA 19-9: 141.9 U/mL — ABNORMAL HIGH (ref <35.0)

## 2009-06-09 ENCOUNTER — Ambulatory Visit: Payer: Self-pay | Admitting: Hematology and Oncology

## 2009-06-12 ENCOUNTER — Ambulatory Visit (HOSPITAL_COMMUNITY): Admission: RE | Admit: 2009-06-12 | Discharge: 2009-06-12 | Payer: Self-pay | Admitting: Hematology and Oncology

## 2009-06-14 LAB — COMPREHENSIVE METABOLIC PANEL
ALT: 20 U/L (ref 0–35)
Alkaline Phosphatase: 314 U/L — ABNORMAL HIGH (ref 39–117)
Sodium: 138 mEq/L (ref 135–145)
Total Bilirubin: 0.3 mg/dL (ref 0.3–1.2)
Total Protein: 5.7 g/dL — ABNORMAL LOW (ref 6.0–8.3)

## 2009-06-14 LAB — CBC WITH DIFFERENTIAL/PLATELET
BASO%: 0 % (ref 0.0–2.0)
LYMPH%: 8.4 % — ABNORMAL LOW (ref 14.0–49.7)
MCHC: 33.9 g/dL (ref 31.5–36.0)
MCV: 96.7 fL (ref 79.5–101.0)
MONO%: 13.6 % (ref 0.0–14.0)
Platelets: 516 10*3/uL — ABNORMAL HIGH (ref 145–400)
RBC: 3.74 10*6/uL (ref 3.70–5.45)
WBC: 5.7 10*3/uL (ref 3.9–10.3)

## 2009-06-20 LAB — BASIC METABOLIC PANEL
BUN: 5 mg/dL — ABNORMAL LOW (ref 6–23)
Calcium: 8.5 mg/dL (ref 8.4–10.5)
Creatinine, Ser: 0.94 mg/dL (ref 0.40–1.20)
Potassium: 4.5 mEq/L (ref 3.5–5.3)

## 2009-06-20 LAB — CBC WITH DIFFERENTIAL/PLATELET
BASO%: 4.9 % — ABNORMAL HIGH (ref 0.0–2.0)
EOS%: 0 % (ref 0.0–7.0)
HCT: 32.4 % — ABNORMAL LOW (ref 34.8–46.6)
MCH: 31.5 pg (ref 25.1–34.0)
MCHC: 33.3 g/dL (ref 31.5–36.0)
MONO%: 6.1 % (ref 0.0–14.0)
NEUT%: 79.9 % — ABNORMAL HIGH (ref 38.4–76.8)
lymph#: 0.4 10*3/uL — ABNORMAL LOW (ref 0.9–3.3)

## 2009-06-21 LAB — CLOSTRIDIUM DIFFICILE EIA

## 2009-06-22 LAB — CBC WITH DIFFERENTIAL/PLATELET
BASO%: 0.2 % (ref 0.0–2.0)
Eosinophils Absolute: 0 10*3/uL (ref 0.0–0.5)
MONO#: 0.7 10*3/uL (ref 0.1–0.9)
NEUT#: 12.1 10*3/uL — ABNORMAL HIGH (ref 1.5–6.5)
Platelets: 114 10*3/uL — ABNORMAL LOW (ref 145–400)
RBC: 3.39 10*6/uL — ABNORMAL LOW (ref 3.70–5.45)
RDW: 16.4 % — ABNORMAL HIGH (ref 11.2–14.5)
WBC: 13.7 10*3/uL — ABNORMAL HIGH (ref 3.9–10.3)
lymph#: 0.9 10*3/uL (ref 0.9–3.3)

## 2009-06-22 LAB — BASIC METABOLIC PANEL
BUN: 5 mg/dL — ABNORMAL LOW (ref 6–23)
CO2: 27 mEq/L (ref 19–32)
Calcium: 8.1 mg/dL — ABNORMAL LOW (ref 8.4–10.5)
Creatinine, Ser: 0.91 mg/dL (ref 0.40–1.20)
Glucose, Bld: 215 mg/dL — ABNORMAL HIGH (ref 70–99)
Sodium: 131 mEq/L — ABNORMAL LOW (ref 135–145)

## 2009-07-03 LAB — CBC WITH DIFFERENTIAL/PLATELET
BASO%: 0.6 % (ref 0.0–2.0)
EOS%: 0.1 % (ref 0.0–7.0)
HCT: 31.2 % — ABNORMAL LOW (ref 34.8–46.6)
LYMPH%: 7.2 % — ABNORMAL LOW (ref 14.0–49.7)
MCH: 32.6 pg (ref 25.1–34.0)
MCHC: 33.9 g/dL (ref 31.5–36.0)
NEUT%: 79 % — ABNORMAL HIGH (ref 38.4–76.8)
RBC: 3.25 10*6/uL — ABNORMAL LOW (ref 3.70–5.45)
lymph#: 0.7 10*3/uL — ABNORMAL LOW (ref 0.9–3.3)

## 2009-07-03 LAB — COMPREHENSIVE METABOLIC PANEL
ALT: 25 U/L (ref 0–35)
AST: 38 U/L — ABNORMAL HIGH (ref 0–37)
Chloride: 102 mEq/L (ref 96–112)
Creatinine, Ser: 0.91 mg/dL (ref 0.40–1.20)
Total Bilirubin: 0.4 mg/dL (ref 0.3–1.2)

## 2009-07-06 ENCOUNTER — Ambulatory Visit: Payer: Self-pay | Admitting: Hematology and Oncology

## 2009-07-21 LAB — CBC WITH DIFFERENTIAL/PLATELET
Basophils Absolute: 0 10*3/uL (ref 0.0–0.1)
HCT: 30.7 % — ABNORMAL LOW (ref 34.8–46.6)
HGB: 10.4 g/dL — ABNORMAL LOW (ref 11.6–15.9)
MCH: 32.8 pg (ref 25.1–34.0)
MONO#: 0.5 10*3/uL (ref 0.1–0.9)
NEUT%: 86.2 % — ABNORMAL HIGH (ref 38.4–76.8)
WBC: 10.7 10*3/uL — ABNORMAL HIGH (ref 3.9–10.3)
lymph#: 1 10*3/uL (ref 0.9–3.3)

## 2009-07-21 LAB — COMPREHENSIVE METABOLIC PANEL
BUN: 5 mg/dL — ABNORMAL LOW (ref 6–23)
CO2: 25 mEq/L (ref 19–32)
Calcium: 7.2 mg/dL — ABNORMAL LOW (ref 8.4–10.5)
Chloride: 105 mEq/L (ref 96–112)
Creatinine, Ser: 1.07 mg/dL (ref 0.40–1.20)
Glucose, Bld: 317 mg/dL — ABNORMAL HIGH (ref 70–99)

## 2009-07-27 LAB — CBC WITH DIFFERENTIAL/PLATELET
BASO%: 0 % (ref 0.0–2.0)
EOS%: 0 % (ref 0.0–7.0)
MCH: 31.9 pg (ref 25.1–34.0)
MCHC: 33.1 g/dL (ref 31.5–36.0)
MCV: 96.4 fL (ref 79.5–101.0)
MONO%: 1.6 % (ref 0.0–14.0)
RBC: 3.35 10*6/uL — ABNORMAL LOW (ref 3.70–5.45)
RDW: 15.9 % — ABNORMAL HIGH (ref 11.2–14.5)
nRBC: 0 % (ref 0–0)

## 2009-08-09 ENCOUNTER — Ambulatory Visit: Payer: Self-pay | Admitting: Oncology

## 2009-08-10 ENCOUNTER — Ambulatory Visit (HOSPITAL_COMMUNITY): Admission: RE | Admit: 2009-08-10 | Discharge: 2009-08-10 | Payer: Self-pay | Admitting: Internal Medicine

## 2009-08-11 LAB — CBC WITH DIFFERENTIAL/PLATELET
BASO%: 0.2 % (ref 0.0–2.0)
LYMPH%: 13.8 % — ABNORMAL LOW (ref 14.0–49.7)
MCHC: 34.5 g/dL (ref 31.5–36.0)
MONO#: 0.6 10*3/uL (ref 0.1–0.9)
Platelets: 392 10*3/uL (ref 145–400)
RBC: 2.95 10*6/uL — ABNORMAL LOW (ref 3.70–5.45)
WBC: 6.1 10*3/uL (ref 3.9–10.3)
lymph#: 0.8 10*3/uL — ABNORMAL LOW (ref 0.9–3.3)

## 2009-08-11 LAB — COMPREHENSIVE METABOLIC PANEL
ALT: 21 U/L (ref 0–35)
CO2: 31 mEq/L (ref 19–32)
Sodium: 138 mEq/L (ref 135–145)
Total Bilirubin: 0.4 mg/dL (ref 0.3–1.2)
Total Protein: 4.4 g/dL — ABNORMAL LOW (ref 6.0–8.3)

## 2009-08-11 LAB — CANCER ANTIGEN 19-9: CA 19-9: 196.9 U/mL — ABNORMAL HIGH (ref <35.0)

## 2009-08-14 LAB — BASIC METABOLIC PANEL
BUN: 5 mg/dL — ABNORMAL LOW (ref 6–23)
Creatinine, Ser: 0.97 mg/dL (ref 0.40–1.20)

## 2009-08-15 LAB — URINALYSIS, MICROSCOPIC - CHCC
Bilirubin (Urine): NEGATIVE
Glucose: NEGATIVE g/dL
Leukocyte Esterase: NEGATIVE
RBC count: NEGATIVE (ref 0–2)
pH: 6 (ref 4.6–8.0)

## 2009-09-06 LAB — COMPREHENSIVE METABOLIC PANEL
ALT: 21 U/L (ref 0–35)
Alkaline Phosphatase: 210 U/L — ABNORMAL HIGH (ref 39–117)
CO2: 33 mEq/L — ABNORMAL HIGH (ref 19–32)
Creatinine, Ser: 1.16 mg/dL (ref 0.40–1.20)
Total Bilirubin: 0.4 mg/dL (ref 0.3–1.2)

## 2009-09-06 LAB — CANCER ANTIGEN 19-9: CA 19-9: 37.7 U/mL — ABNORMAL HIGH (ref <35.0)

## 2009-09-20 ENCOUNTER — Encounter: Admission: RE | Admit: 2009-09-20 | Discharge: 2009-09-20 | Payer: Self-pay | Admitting: Internal Medicine

## 2009-09-28 ENCOUNTER — Encounter: Admission: RE | Admit: 2009-09-28 | Discharge: 2009-09-28 | Payer: Self-pay | Admitting: Internal Medicine

## 2009-10-05 ENCOUNTER — Encounter: Admission: RE | Admit: 2009-10-05 | Discharge: 2009-10-05 | Payer: Self-pay | Admitting: Internal Medicine

## 2009-10-06 ENCOUNTER — Encounter: Admission: RE | Admit: 2009-10-06 | Discharge: 2009-10-06 | Payer: Self-pay | Admitting: Internal Medicine

## 2009-10-09 ENCOUNTER — Ambulatory Visit (HOSPITAL_COMMUNITY): Admission: RE | Admit: 2009-10-09 | Discharge: 2009-10-09 | Payer: Self-pay | Admitting: Internal Medicine

## 2009-10-13 ENCOUNTER — Ambulatory Visit: Payer: Self-pay | Admitting: Oncology

## 2009-10-17 LAB — CBC WITH DIFFERENTIAL/PLATELET
BASO%: 0.7 % (ref 0.0–2.0)
HCT: 32.6 % — ABNORMAL LOW (ref 34.8–46.6)
LYMPH%: 12.2 % — ABNORMAL LOW (ref 14.0–49.7)
MCH: 32.8 pg (ref 25.1–34.0)
MCHC: 33.5 g/dL (ref 31.5–36.0)
MCV: 97.9 fL (ref 79.5–101.0)
MONO#: 0.6 10*3/uL (ref 0.1–0.9)
MONO%: 8.6 % (ref 0.0–14.0)
NEUT%: 78 % — ABNORMAL HIGH (ref 38.4–76.8)
Platelets: 376 10*3/uL (ref 145–400)
WBC: 7.5 10*3/uL (ref 3.9–10.3)

## 2009-10-17 LAB — COMPREHENSIVE METABOLIC PANEL
ALT: 25 U/L (ref 0–35)
AST: 36 U/L (ref 0–37)
Alkaline Phosphatase: 194 U/L — ABNORMAL HIGH (ref 39–117)
CO2: 34 mEq/L — ABNORMAL HIGH (ref 19–32)
Sodium: 137 mEq/L (ref 135–145)
Total Bilirubin: 0.3 mg/dL (ref 0.3–1.2)
Total Protein: 5.2 g/dL — ABNORMAL LOW (ref 6.0–8.3)

## 2009-11-29 ENCOUNTER — Ambulatory Visit: Payer: Self-pay | Admitting: Hematology and Oncology

## 2009-11-29 LAB — COMPREHENSIVE METABOLIC PANEL
ALT: 44 U/L — ABNORMAL HIGH (ref 0–35)
CO2: 33 mEq/L — ABNORMAL HIGH (ref 19–32)
Chloride: 94 mEq/L — ABNORMAL LOW (ref 96–112)
Sodium: 137 mEq/L (ref 135–145)
Total Bilirubin: 0.3 mg/dL (ref 0.3–1.2)
Total Protein: 4.9 g/dL — ABNORMAL LOW (ref 6.0–8.3)

## 2009-11-29 LAB — CBC WITH DIFFERENTIAL/PLATELET
BASO%: 0.2 % (ref 0.0–2.0)
LYMPH%: 7.2 % — ABNORMAL LOW (ref 14.0–49.7)
MCHC: 34.7 g/dL (ref 31.5–36.0)
MONO#: 0.4 10*3/uL (ref 0.1–0.9)
RBC: 3.58 10*6/uL — ABNORMAL LOW (ref 3.70–5.45)
WBC: 14.1 10*3/uL — ABNORMAL HIGH (ref 3.9–10.3)
lymph#: 1 10*3/uL (ref 0.9–3.3)

## 2009-11-29 LAB — CANCER ANTIGEN 19-9: CA 19-9: 86 U/mL — ABNORMAL HIGH (ref <35.0)

## 2009-12-04 ENCOUNTER — Ambulatory Visit (HOSPITAL_COMMUNITY): Admission: RE | Admit: 2009-12-04 | Discharge: 2009-12-04 | Payer: Self-pay | Admitting: Hematology and Oncology

## 2010-01-19 ENCOUNTER — Ambulatory Visit: Payer: Self-pay | Admitting: Hematology and Oncology

## 2010-01-29 ENCOUNTER — Inpatient Hospital Stay (HOSPITAL_COMMUNITY): Admission: EM | Admit: 2010-01-29 | Discharge: 2010-02-02 | Payer: Self-pay | Admitting: Emergency Medicine

## 2010-01-30 ENCOUNTER — Ambulatory Visit: Payer: Self-pay | Admitting: Surgery

## 2010-01-30 ENCOUNTER — Encounter (INDEPENDENT_AMBULATORY_CARE_PROVIDER_SITE_OTHER): Payer: Self-pay | Admitting: Internal Medicine

## 2010-02-22 ENCOUNTER — Ambulatory Visit: Payer: Self-pay | Admitting: Hematology and Oncology

## 2010-02-26 ENCOUNTER — Ambulatory Visit (HOSPITAL_COMMUNITY): Admission: RE | Admit: 2010-02-26 | Discharge: 2010-02-26 | Payer: Self-pay | Admitting: Hematology and Oncology

## 2010-02-26 LAB — CBC WITH DIFFERENTIAL/PLATELET
Eosinophils Absolute: 0 10*3/uL (ref 0.0–0.5)
MONO#: 0.3 10*3/uL (ref 0.1–0.9)
NEUT#: 9.2 10*3/uL — ABNORMAL HIGH (ref 1.5–6.5)
RBC: 3.39 10*6/uL — ABNORMAL LOW (ref 3.70–5.45)
RDW: 16.6 % — ABNORMAL HIGH (ref 11.2–14.5)
WBC: 10.1 10*3/uL (ref 3.9–10.3)
lymph#: 0.5 10*3/uL — ABNORMAL LOW (ref 0.9–3.3)

## 2010-02-26 LAB — COMPREHENSIVE METABOLIC PANEL
Albumin: 2.1 g/dL — ABNORMAL LOW (ref 3.5–5.2)
CO2: 39 mEq/L — ABNORMAL HIGH (ref 19–32)
Glucose, Bld: 136 mg/dL — ABNORMAL HIGH (ref 70–99)
Potassium: 2.5 mEq/L — CL (ref 3.5–5.3)
Sodium: 133 mEq/L — ABNORMAL LOW (ref 135–145)
Total Protein: 4.9 g/dL — ABNORMAL LOW (ref 6.0–8.3)

## 2010-02-26 LAB — CANCER ANTIGEN 19-9: CA 19-9: 109 U/mL — ABNORMAL HIGH (ref <35.0)

## 2010-02-26 LAB — LACTATE DEHYDROGENASE: LDH: 242 U/L (ref 94–250)

## 2010-03-02 ENCOUNTER — Inpatient Hospital Stay (HOSPITAL_COMMUNITY): Admission: EM | Admit: 2010-03-02 | Discharge: 2010-03-09 | Payer: Self-pay | Admitting: Emergency Medicine

## 2010-03-06 ENCOUNTER — Ambulatory Visit: Payer: Self-pay | Admitting: Hematology and Oncology

## 2010-03-07 ENCOUNTER — Ambulatory Visit: Payer: Self-pay | Admitting: Gastroenterology

## 2010-03-08 ENCOUNTER — Encounter (INDEPENDENT_AMBULATORY_CARE_PROVIDER_SITE_OTHER): Payer: Self-pay | Admitting: Internal Medicine

## 2010-03-08 ENCOUNTER — Encounter: Payer: Self-pay | Admitting: Internal Medicine

## 2010-03-08 ENCOUNTER — Encounter (INDEPENDENT_AMBULATORY_CARE_PROVIDER_SITE_OTHER): Payer: Self-pay | Admitting: *Deleted

## 2010-03-12 ENCOUNTER — Encounter (INDEPENDENT_AMBULATORY_CARE_PROVIDER_SITE_OTHER): Payer: Self-pay | Admitting: *Deleted

## 2010-03-14 ENCOUNTER — Telehealth: Payer: Self-pay | Admitting: Gastroenterology

## 2010-03-30 ENCOUNTER — Ambulatory Visit: Payer: Self-pay | Admitting: Hematology and Oncology

## 2010-04-02 LAB — COMPREHENSIVE METABOLIC PANEL
ALT: 24 U/L (ref 0–35)
Albumin: 2.6 g/dL — ABNORMAL LOW (ref 3.5–5.2)
Alkaline Phosphatase: 169 U/L — ABNORMAL HIGH (ref 39–117)
Glucose, Bld: 126 mg/dL — ABNORMAL HIGH (ref 70–99)
Potassium: 3.2 mEq/L — ABNORMAL LOW (ref 3.5–5.3)
Sodium: 140 mEq/L (ref 135–145)
Total Bilirubin: 0.6 mg/dL (ref 0.3–1.2)
Total Protein: 5.5 g/dL — ABNORMAL LOW (ref 6.0–8.3)

## 2010-04-02 LAB — CBC WITH DIFFERENTIAL/PLATELET
BASO%: 0 % (ref 0.0–2.0)
Eosinophils Absolute: 0 10*3/uL (ref 0.0–0.5)
LYMPH%: 10.7 % — ABNORMAL LOW (ref 14.0–49.7)
MCHC: 34.2 g/dL (ref 31.5–36.0)
MCV: 98.7 fL (ref 79.5–101.0)
MONO#: 0.7 10*3/uL (ref 0.1–0.9)
MONO%: 7.6 % (ref 0.0–14.0)
NEUT#: 7.6 10*3/uL — ABNORMAL HIGH (ref 1.5–6.5)
RBC: 3.4 10*6/uL — ABNORMAL LOW (ref 3.70–5.45)
RDW: 15.6 % — ABNORMAL HIGH (ref 11.2–14.5)
WBC: 9.3 10*3/uL (ref 3.9–10.3)

## 2010-04-03 ENCOUNTER — Ambulatory Visit: Payer: Self-pay | Admitting: Gastroenterology

## 2010-04-03 DIAGNOSIS — E1129 Type 2 diabetes mellitus with other diabetic kidney complication: Secondary | ICD-10-CM

## 2010-04-03 DIAGNOSIS — K8689 Other specified diseases of pancreas: Secondary | ICD-10-CM | POA: Insufficient documentation

## 2010-04-03 DIAGNOSIS — C240 Malignant neoplasm of extrahepatic bile duct: Secondary | ICD-10-CM

## 2010-04-13 ENCOUNTER — Ambulatory Visit: Payer: Self-pay | Admitting: Gastroenterology

## 2010-04-18 ENCOUNTER — Encounter: Admission: RE | Admit: 2010-04-18 | Discharge: 2010-05-25 | Payer: Self-pay | Admitting: Internal Medicine

## 2010-04-20 ENCOUNTER — Encounter: Payer: Self-pay | Admitting: Gastroenterology

## 2010-05-29 ENCOUNTER — Ambulatory Visit: Payer: Self-pay | Admitting: Hematology and Oncology

## 2010-05-31 ENCOUNTER — Ambulatory Visit (HOSPITAL_COMMUNITY): Admission: RE | Admit: 2010-05-31 | Discharge: 2010-05-31 | Payer: Self-pay | Admitting: Hematology and Oncology

## 2010-05-31 LAB — CBC WITH DIFFERENTIAL/PLATELET
Basophils Absolute: 0 10*3/uL (ref 0.0–0.1)
Eosinophils Absolute: 0 10*3/uL (ref 0.0–0.5)
HCT: 32.7 % — ABNORMAL LOW (ref 34.8–46.6)
HGB: 11.2 g/dL — ABNORMAL LOW (ref 11.6–15.9)
LYMPH%: 11.2 % — ABNORMAL LOW (ref 14.0–49.7)
MCHC: 34.3 g/dL (ref 31.5–36.0)
MONO#: 0.7 10*3/uL (ref 0.1–0.9)
NEUT#: 8.4 10*3/uL — ABNORMAL HIGH (ref 1.5–6.5)
NEUT%: 81.7 % — ABNORMAL HIGH (ref 38.4–76.8)
Platelets: 339 10*3/uL (ref 145–400)
WBC: 10.3 10*3/uL (ref 3.9–10.3)

## 2010-05-31 LAB — COMPREHENSIVE METABOLIC PANEL
BUN: 26 mg/dL — ABNORMAL HIGH (ref 6–23)
CO2: 35 mEq/L — ABNORMAL HIGH (ref 19–32)
Calcium: 9 mg/dL (ref 8.4–10.5)
Chloride: 92 mEq/L — ABNORMAL LOW (ref 96–112)
Creatinine, Ser: 0.98 mg/dL (ref 0.40–1.20)
Glucose, Bld: 126 mg/dL — ABNORMAL HIGH (ref 70–99)
Total Bilirubin: 0.4 mg/dL (ref 0.3–1.2)

## 2010-05-31 LAB — CANCER ANTIGEN 19-9: CA 19-9: 36.1 U/mL — ABNORMAL HIGH (ref <35.0)

## 2010-05-31 LAB — CEA: CEA: 25.3 ng/mL — ABNORMAL HIGH (ref 0.0–5.0)

## 2010-06-06 ENCOUNTER — Encounter: Payer: Self-pay | Admitting: Gastroenterology

## 2010-07-16 ENCOUNTER — Ambulatory Visit: Payer: Self-pay | Admitting: Hematology and Oncology

## 2010-09-03 ENCOUNTER — Ambulatory Visit: Payer: Self-pay | Admitting: Hematology and Oncology

## 2010-09-26 ENCOUNTER — Encounter: Payer: Self-pay | Admitting: Gastroenterology

## 2010-09-26 LAB — BASIC METABOLIC PANEL
BUN: 26 mg/dL — ABNORMAL HIGH (ref 6–23)
CO2: 35 mEq/L — ABNORMAL HIGH (ref 19–32)
Sodium: 135 mEq/L (ref 135–145)

## 2010-10-15 ENCOUNTER — Ambulatory Visit: Payer: Self-pay | Admitting: Hematology and Oncology

## 2010-11-07 ENCOUNTER — Encounter
Admission: RE | Admit: 2010-11-07 | Discharge: 2010-11-07 | Payer: Self-pay | Source: Home / Self Care | Attending: Internal Medicine | Admitting: Internal Medicine

## 2010-11-26 ENCOUNTER — Ambulatory Visit: Payer: Self-pay | Admitting: Hematology and Oncology

## 2010-11-28 LAB — CBC WITH DIFFERENTIAL/PLATELET
BASO%: 0.2 % (ref 0.0–2.0)
Basophils Absolute: 0 10*3/uL (ref 0.0–0.1)
EOS%: 0.5 % (ref 0.0–7.0)
Eosinophils Absolute: 0 10*3/uL (ref 0.0–0.5)
HCT: 36.3 % (ref 34.8–46.6)
HGB: 12.1 g/dL (ref 11.6–15.9)
LYMPH%: 16.9 % (ref 14.0–49.7)
MCH: 29.6 pg (ref 25.1–34.0)
MCHC: 33.3 g/dL (ref 31.5–36.0)
MCV: 88.8 fL (ref 79.5–101.0)
MONO#: 0.5 10*3/uL (ref 0.1–0.9)
MONO%: 7 % (ref 0.0–14.0)
NEUT#: 5.7 10*3/uL (ref 1.5–6.5)
NEUT%: 75.4 % (ref 38.4–76.8)
Platelets: 321 10*3/uL (ref 145–400)
RBC: 4.09 10*6/uL (ref 3.70–5.45)
RDW: 16.4 % — ABNORMAL HIGH (ref 11.2–14.5)
WBC: 7.5 10*3/uL (ref 3.9–10.3)
lymph#: 1.3 10*3/uL (ref 0.9–3.3)

## 2010-11-28 LAB — COMPREHENSIVE METABOLIC PANEL
ALT: 18 U/L (ref 0–35)
AST: 21 U/L (ref 0–37)
Albumin: 4.4 g/dL (ref 3.5–5.2)
Alkaline Phosphatase: 118 U/L — ABNORMAL HIGH (ref 39–117)
BUN: 28 mg/dL — ABNORMAL HIGH (ref 6–23)
CO2: 32 mEq/L (ref 19–32)
Calcium: 10.1 mg/dL (ref 8.4–10.5)
Chloride: 90 mEq/L — ABNORMAL LOW (ref 96–112)
Creatinine, Ser: 1.06 mg/dL (ref 0.40–1.20)
Glucose, Bld: 151 mg/dL — ABNORMAL HIGH (ref 70–99)
Potassium: 2.9 mEq/L — ABNORMAL LOW (ref 3.5–5.3)
Sodium: 138 mEq/L (ref 135–145)
Total Bilirubin: 0.4 mg/dL (ref 0.3–1.2)
Total Protein: 7.2 g/dL (ref 6.0–8.3)

## 2010-11-28 LAB — CANCER ANTIGEN 19-9: CA 19-9: 68.6 U/mL — ABNORMAL HIGH (ref <35.0)

## 2010-11-28 LAB — LACTATE DEHYDROGENASE: LDH: 140 U/L (ref 94–250)

## 2010-11-28 LAB — CEA: CEA: 19.7 ng/mL — ABNORMAL HIGH (ref 0.0–5.0)

## 2010-12-04 ENCOUNTER — Encounter: Payer: Self-pay | Admitting: Gastroenterology

## 2010-12-07 ENCOUNTER — Other Ambulatory Visit: Payer: Self-pay | Admitting: Hematology and Oncology

## 2010-12-07 DIAGNOSIS — C240 Malignant neoplasm of extrahepatic bile duct: Secondary | ICD-10-CM

## 2010-12-09 ENCOUNTER — Encounter: Payer: Self-pay | Admitting: Internal Medicine

## 2010-12-09 ENCOUNTER — Encounter: Payer: Self-pay | Admitting: Hematology and Oncology

## 2010-12-09 ENCOUNTER — Encounter: Payer: Self-pay | Admitting: Gastroenterology

## 2010-12-18 NOTE — Procedures (Signed)
Summary: Upper Endoscopy  Patient: [name] Note: All result statuses are Final unless otherwise noted.  Tests: (1) Upper Endoscopy (EGD)   EGD Upper Endoscopy       DONE (C)     [facility]     [address]     [place], [place]  [address]           ENDOSCOPY PROCEDURE REPORT           PATIENT:  Courtney Stevens, Courtney Stevens  MR#:  [id number]     BIRTHDATE:  1956/08/04, 54 yrs. old  GENDER:  female           ENDOSCOPIST:  Barbette Hair. Arlyce Dice, MD     Referred by:           PROCEDURE DATE:  04/13/2010     PROCEDURE:  EGD with biopsy     ASA CLASS:  Class II     INDICATIONS:  s/p Whipple procedure for Klatskin tumor.  Recent     rise in tumor markers           MEDICATIONS:   There was residual sedation effect present from     prior procedure.     TOPICAL ANESTHETIC:  Exactacain Spray           DESCRIPTION OF PROCEDURE:   After the risks benefits and     alternatives of the procedure were thoroughly explained, informed     consent was obtained.  The LB [id number]-H180 [id number] endoscope was     introduced through the mouth and advanced to the proximal jejunum,     without limitations.  The instrument was slowly withdrawn as the     mucosa was fully examined.  Anatomy was altered due to previous     surgery.  A short gastric sump was anastamosed to a loop of     jejunum.  This led into the stomach where a gastrojejunostomy was     noted.     <<PROCEDUREIMAGES>>           Inflammation was found. Diffuse mucosal edema. Biopsies were     taken. There were several 2mm gastric polyps. Bxs taken (see     image2, image3, and image4).  A nodule was found at the     gastroesophageal junction. Single 5mm nodule at GE junction. Bxs     taken (see image7).  normal otherwise in the proximal jejunum (see     image1).    Retroflexion was not performed.  The scope was then     withdrawn from the patient and the procedure completed.           COMPLICATIONS:  None           ENDOSCOPIC IMPRESSION:     1)  Gastritis     2) Nodule at the gastroesophageal junction     3) Altered UGI anatomy from prior Whipple procedure (probable     pyloric sparing)           No findings to explain rise in tumor markers           RECOMMENDATIONS:     1) Await biopsy results           REPEAT EXAM:  No           ______________________________     Barbette Hair. Arlyce Dice, MD           CC:  Lucky Cowboy, MD, Thalia Party, MD  n.     REVISED:  04/13/2010 04:02 PM     eSIGNED:   Barbette Hair. Genecis Veley at 04/13/2010 04:02 PM           Varney Biles, [id number]  Note: An exclamation mark (!) indicates a result that was not dispersed into the flowsheet. Document Creation Date: 04/13/2010 4:03 PM _______________________________________________________________________  (1) Order result status: Final Collection or observation date-time: 04/13/2010 15:36 Requested date-time:  Receipt date-time:  Reported date-time:  Referring Physician:   Ordering Physician: Melvia Heaps [phone]) Specimen Source:  Source: Launa Grill Order Number: [id number] Lab site:

## 2010-12-18 NOTE — Procedures (Signed)
Summary: colonoscopy   Colonoscopy  Procedure date:  07/06/2003  Findings:      Results: Diverticulosis.       Location:  [facility].    Comments:      Repeat colonoscopy in 5 years.   Colonoscopy  Procedure date:  07/06/2003  Findings:      Results: Diverticulosis.       Location:   [facility].    Comments:      Repeat colonoscopy in 5 years.  Patient Name: Courtney Stevens, Courtney Stevens MRN:  Procedure Procedures: Colonoscopy CPT: [phone].  Personnel: Endoscopist: Barbette Hair. Arlyce Dice, MD.  Indications  Surveillance of: Adenomatous Polyp(s). Last exam: Apr, 2001.  History  Pre-Exam Physical: Performed Jul 06, 2003. Entire physical exam was normal.  Exam Exam: Extent of exam reached: Cecum, extent intended: Cecum.  The cecum was identified by IC valve. Colon retroflexion performed. ASA Classification: I. Tolerance: good.  Monitoring: Pulse and BP monitoring, Oximetry used. Supplemental O2 given. at 2 Liters.  Colon Prep Used Golytely for colon prep. Prep results: good.  Sedation Meds: Fentanyl 100 mcg. given IV. Versed 10 mg. given IV.  Findings - DIVERTICULOSIS: Descending Colon to Sigmoid Colon. ICD9: Diverticulosis: 562.10. Comments: Scattered tics.  NORMAL EXAM: Cecum.  NORMAL EXAM: Rectum.   Assessment Abnormal examination, see findings above.  Diagnoses: 562.10: Diverticulosis.   Events  Unplanned Interventions: No intervention was required.  Unplanned Events: There were no complications. Plans Patient Education: Patient given standard instructions for: Diverticulosis.  Scheduling/Referral: Colonoscopy, to Barbette Hair. Arlyce Dice, MD, around Jul 05, 2008.    This report was created from the original endoscopy report, which was reviewed and signed by the above listed endoscopist.    cc. Jeannene Patella

## 2010-12-18 NOTE — Letter (Signed)
Summary: Diabetic Instructions  [facility]  [address] [place], [place] [address]   Phone: [phone]  Fax: [phone]    Courtney Stevens [birth year] MRN: [id number]   X    ORAL DIABETIC MEDICATION INSTRUCTIONS  The day before your procedure:   Take your diabetic pill as you do normally  The day of your procedure:   Do not take your diabetic pill    We will check your blood sugar levels during the admission process and again in Recovery before discharging you home  ________________________________________________________________________  _  _   INSULIN (LONG ACTING) MEDICATION INSTRUCTIONS (Lantus, NPH, 70/30, Humulin, Novolin-N)   The day before your procedure:   Take  your regular evening dose    The day of your procedure:   Do not take your morning dose    _  _   INSULIN (SHORT ACTING) MEDICATION INSTRUCTIONS (Regular, Humulog, Novolog)   The day before your procedure:   Do not take your evening dose   The day of your procedure:   Do not take your morning dose   _  _   INSULIN PUMP MEDICATION INSTRUCTIONS  We will contact the physician managing your diabetic care for written dosage instructions for the day before your procedure and the day of your procedure.  Once we have received the instructions, we will contact you.

## 2010-12-18 NOTE — Procedures (Signed)
Summary: EGD/[facility]  EGD/[facility]   Imported By: Sherian Rein 04/04/2010 07:51:12  _____________________________________________________________________  External Attachment:    Type:   Image     Comment:   External Document

## 2010-12-18 NOTE — Letter (Signed)
Summary: [facility] Instructions  [facility]  [address] [place], [place] [address]   Phone: [phone]  Fax: [phone]       Courtney Stevens    1956/06/10    MRN: [id number]        Procedure Day /Date:FRIDAY 04/13/2010     Arrival Time:1:30PM     Procedure Time:2:30PM     Location of Procedure:                    X   [facility] (4th Floor)  PREPARATION FOR COLONOSCOPY WITH MOVIPREP/EGD   Starting 5 days prior to your procedure 04/08/2010 do not eat nuts, seeds, popcorn, corn, beans, peas,  salads, or any raw vegetables.  Do not take any fiber supplements (e.g. Metamucil, Citrucel, and Benefiber).  THE DAY BEFORE YOUR PROCEDURE         DATE: 04/12/2010  DAY: THURSDAY  1.  Drink clear liquids the entire day-NO SOLID FOOD  2.  Do not drink anything colored red or purple.  Avoid juices with pulp.  No orange juice.  3.  Drink at least 64 oz. (8 glasses) of fluid/clear liquids during the day to prevent dehydration and help the prep work efficiently.  CLEAR LIQUIDS INCLUDE: Water Jello Ice Popsicles Tea (sugar ok, no milk/cream) Powdered fruit flavored drinks Coffee (sugar ok, no milk/cream) Gatorade Juice: apple, white grape, white cranberry  Lemonade Clear bullion, consomm, broth Carbonated beverages (any kind) Strained chicken noodle soup Hard Candy                             4.  In the morning, mix first dose of MoviPrep solution:    Empty 1 Pouch A and 1 Pouch B into the disposable container    Add lukewarm drinking water to the top line of the container. Mix to dissolve    Refrigerate (mixed solution should be used within 24 hrs)  5.  Begin drinking the prep at 5:00 p.m. The MoviPrep container is divided by 4 marks.   Every 15 minutes drink the solution down to the next mark (approximately 8 oz) until the full liter is complete.   6.  Follow completed prep with 16 oz of clear liquid of your choice (Nothing red or purple).   Continue to drink clear liquids until bedtime.  7.  Before going to bed, mix second dose of MoviPrep solution:    Empty 1 Pouch A and 1 Pouch B into the disposable container    Add lukewarm drinking water to the top line of the container. Mix to dissolve    Refrigerate  THE DAY OF YOUR PROCEDURE      DATE: 04/13/2010 DAY: FRIDAY  Beginning at 8:30a.m. (5 hours before procedure):         1. Every 15 minutes, drink the solution down to the next mark (approx 8 oz) until the full liter is complete.  2. Follow completed prep with 16 oz. of clear liquid of your choice.    3. You may drink clear liquids until 12:30PM (2 HOURS BEFORE PROCEDURE).   MEDICATION INSTRUCTIONS  Unless otherwise instructed, you should take regular prescription medications with a small sip of water   as early as possible the morning of your procedure.          OTHER INSTRUCTIONS  You will need a responsible adult at least 56 years of age to accompany you  and drive you home.   This person must remain in the waiting room during your procedure.  Wear loose fitting clothing that is easily removed.  Leave jewelry and other valuables at home.  However, you may wish to bring a book to read or  an iPod/MP3 player to listen to music as you wait for your procedure to start.  Remove all body piercing jewelry and leave at home.  Total time from sign-in until discharge is approximately 2-3 hours.  You should go home directly after your procedure and rest.  You can resume normal activities the  day after your procedure.  The day of your procedure you should not:   Drive   Make legal decisions   Operate machinery   Drink alcohol   Return to work  You will receive specific instructions about eating, activities and medications before you leave.    The above instructions have been reviewed and explained to me by   _______________________    I fully understand and can verbalize these instructions  _____________________________ Date _________

## 2010-12-18 NOTE — Assessment & Plan Note (Signed)
Summary: POST HOSPITAL F/U--CHRONIC [facility] INSUFF.       ...    History of Present Illness Visit Type: Follow-up Visit Primary GI MD: Melvia Heaps MD [facility] Primary Provider: Lucky Cowboy, MD Chief Complaint: Patient here for f/u after hospitalization for diarrhea and pancreatic insufficiency. Patient states that her diarrhea has much improved. She is still fatigued and weak. Does have SOB. History of Present Illness:   Courtney Stevens is a 55 year old white female with a history of a Klatskin's tumor, status post Whipple procedure with postoperative radiation therapy and chemotherapy including 5-FU.  She was hospitalized in April, 2011 for anasarca thought to be due to pancreatic insufficiency.  On Creon her diarrhea has significantly improved.  Although CT was negative for recurrent disease tumor markers were elevated.  CA 19-9 was 65, CEA 32 MCV 125 368.9. A cytology on her ascitic fluid demonstrated few atypical cells. Her main complaint is weakness and fatigue.  Appetite is excellent.   GI Review of Systems      Denies abdominal pain, acid reflux, belching, bloating, chest pain, dysphagia with liquids, dysphagia with solids, heartburn, loss of appetite, nausea, vomiting, vomiting blood, weight loss, and  weight gain.        Denies anal fissure, black tarry stools, change in bowel habit, constipation, diarrhea, diverticulosis, fecal incontinence, heme positive stool, hemorrhoids, irritable bowel syndrome, jaundice, light color stool, liver problems, rectal bleeding, and  rectal pain. Preventive Screening-Counseling & Management  Alcohol-Tobacco     Smoking Status: quit  Caffeine-Diet-Exercise     Does Patient Exercise: no      Drug Use:  no.      Current Medications (verified): 1)  Creon 12000 Unit Cpep (Pancrelipase (Lip-Prot-Amyl)) .Marland Kitchen.. 1 Capsule By Mouth Three Times A Day With Meals 2)  Potassium Chloride Cr 10 Meq Cr-Tabs (Potassium Chloride) .... Takes 40mg  By Mouth  Two Times A Day 3)  Furosemide 40 Mg Tabs (Furosemide) .Marland Kitchen.. 1 By Mouth Two Times A Day 4)  Levothyroxine Sodium 200 Mcg Solr (Levothyroxine Sodium) .Marland Kitchen.. 1 By Mouth Once Daily 5)  Alprazolam 1 Mg Tabs (Alprazolam) .Marland Kitchen.. 1 By Mouth At Bedtime 6)  Calcium-Magnesium-Zinc 333-133-8.3 Mg Tabs (Calcium-Magnesium-Zinc) .Marland Kitchen.. 1 By Mouth Once Daily 7)  Calcium-Vitamin D 250-125 Mg-Unit Tabs (Calcium Carbonate-Vitamin D) .Marland Kitchen.. 1 By Mouth Once Daily 8)  Cetirizine Hcl 10 Mg Tabs (Cetirizine Hcl) .Marland Kitchen.. 1 By Mouth Once Daily 9)  Ferrous Sulfate 325 (65 Fe) Mg Tabs (Ferrous Sulfate) .Marland Kitchen.. 1 By Mouth Once Daily 10)  Glyburide 5 Mg Tabs (Glyburide) .Marland Kitchen.. 1 By Mouth Once Daily 11)  Magnesium Citrate 1.745 Gm/55ml Soln (Magnesium Citrate) .Marland Kitchen.. 1 By Mouth Two Times A Day 12)  Metformin Hcl 500 Mg Tabs (Metformin Hcl) .Marland Kitchen.. 1 By Mouth Two Times A Day 13)  Multivitamins  Tabs (Multiple Vitamin) .Marland Kitchen.. 1 By Mouth Two Times A Day 14)  Omeprazole 40 Mg Cpdr (Omeprazole) .Marland Kitchen.. 1 By Mouth Two Times A Day 15)  Vitamin B-12 250 Mcg Tabs (Cyanocobalamin) .Marland Kitchen.. 1 By Mouth Once Daily 16)  Vitamin D (Ergocalciferol) 50000 Unit Caps (Ergocalciferol) .Marland Kitchen.. 1 Every Evening 17)  Metolazone 5 Mg Tabs (Metolazone) .... Take 1 Tablet Daily Alternating With 2 Tablets Daily 18)  Metoclopramide Hcl 10 Mg Tabs (Metoclopramide Hcl) .... Take 1 Tablet By Mouth Three Times A Day  Allergies (verified): 1)  ! Betadine  Past History:  Past Medical History: Reviewed history from 03/29/2010 and no changes required. Current Problems:  NONSPECIFIC ABNORM FIND RAD&OTH EXAM BILI  TRACT (ICD-793.3) Asthma Hypertension Arthritis Diverticulosis  2004 Hx of colon polyps 2001 Pancreatic insufficiency Macrocytic Anemia Hypothyroidism E-Coli UTI Stage 2 Chronic Kidney Disease Type 2 diabetes  Past Surgical History: Gastric Bypass Surgery Appendectomy Cholecystectomy Total Abdominal Hysterectomy Bile duct removed 1/2 pancreas removed  Family  History: Family History of Colon Cancer: Mother Family History of Colon Polyps: Father Family History of Diabetes: Father Family History of Heart Disease: Father  Social History: Patient is a former smoker. -stopped 30 years ago Alcohol Use - yes-very rare Illicit Drug Use - no Patient does not get regular exercise.  Smoking Status:  quit Drug Use:  no Does Patient Exercise:  no  Review of Systems       The patient complains of blood in urine, fatigue, muscle pains/cramps, and swelling of feet/legs.  The patient denies allergy/sinus, anemia, anxiety-new, arthritis/joint pain, back pain, breast changes/lumps, change in vision, confusion, cough, coughing up blood, depression-new, fainting, fever, headaches-new, hearing problems, heart murmur, heart rhythm changes, itching, menstrual pain, night sweats, nosebleeds, pregnancy symptoms, shortness of breath, skin rash, sleeping problems, sore throat, swollen lymph glands, thirst - excessive , urination - excessive , urination changes/pain, urine leakage, vision changes, and voice change.         All other systems were reviewed and were negative   Vital Signs:  Patient profile:   55 year old female Height:      67 inches Weight:      151.38 pounds BMI:     23.80 BSA:     1.80 Pulse rate:   76 / minute Pulse rhythm:   regular BP sitting:   114 / 80  (left arm)  Vitals Entered By: Lamona Curl CMA Duncan Dull) (Apr 03, 2010 10:35 AM)  Physical Exam  Additional Exam:  N. physical exam she is a thin female  skin: anicteric HEENT: normocephalic; PEERLA; no nasal or pharyngeal abnormalities neck: supple nodes: no cervical lymphadenopathy chest: clear to ausculatation and percussion heart: no gallops, or rubs; there is a 1-2/6 early systolic murmur abd: soft, nontender; BS normoactive; no abdominal masses, tenderness, organomegaly; there is no frank ascites rectal: deferred ext: no cynanosis, clubbing; there is 1-2+ ankle  edema skeletal: no deformities neuro: oriented x 3; no focal abnormalities    Impression & Recommendations:  Problem # 1:  PANCREATIC INSUFFICIENCY (ICD-577.8) Assessment Improved Plan to continue Creon  Problem # 2:  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  Ascitic fluid cytology showed a few atypical cells.  Recommendations #1 endoscopy and colonoscopy-to be done at the same time #2 if the above studies are negative I will obtain followup tumor markers  Problem # 3:  DM (ICD-250.00) Assessment: Comment Only  Patient Instructions: 1)  Copy sent to : Courtney Stevens, Thalia Party, MD 2)  Your colonoscopy/Endoscopy is scheduled for 04/13/2010 at 2:30pm 3)  You can pick up your MoviPrep from your pharmacy today 4)  The medication list was reviewed and reconciled.  All changed / newly prescribed medications were explained.  A complete medication list was provided to the patient / caregiver.  Appended Document: Orders Update    Clinical Lists Changes  Orders: Added new Test order of Colon/Endo (Colon/Endo) - Signed      Appended Document: POST HOSPITAL F/U--CHRONIC [facility] INSUFF.       ...    Clinical Lists Changes  Medications: Added new medication of MOVIPREP 100 GM  SOLR (  PEG-KCL-NACL-NASULF-NA ASC-C) As per prep instructions. - Signed Rx of MOVIPREP 100 GM  SOLR (PEG-KCL-NACL-NASULF-NA ASC-C) As per prep instructions.;  #1 x 0;  Signed;  Entered by: Merri Ray CMA (AAMA);  Authorized by: Louis Meckel MD;  Method used: [facility] to [facility] [address] [phone]*, [address], [place], [place], [place]  [address], Ph: [phone] or [phone], Fax: [phone]    Prescriptions: MOVIPREP 100 GM  SOLR (PEG-KCL-NACL-NASULF-NA ASC-C) As per prep instructions.  #1 x 0   Entered by:   Merri Ray CMA (AAMA)   Authorized by:    Louis Meckel MD   Signed by:   Merri Ray CMA (AAMA) on 04/03/2010   Method used:   [facility] to        [facility] [address] [phone]* (retail)       [address]       [place], [place]  [address]       Ph: [phone] or [phone]       Fax: [phone]   RxID:   [id number]

## 2010-12-18 NOTE — Letter (Signed)
Summary: Appt Reminder 2  [facility]  [address] [place], [place] [address]   Phone: [phone]  Fax: [phone]        March 12, 2010 MRN: [id number]    [facility] [address] [place], [place]  [address]    Dear Ms. [facility],   You have a return appointment with Dr.Robert Arlyce Dice on 04-03-10 at 10:30am. Please remember to bring a complete list of the medicines you are taking, your insurance card and your co-pay.  If you have to cancel or reschedule this appointment, please call before 5:00 pm the evening before to avoid a cancellation fee.  If you have any questions or concerns, please call [phone].    Sincerely,    Laureen Ochs LPN  Appended Document: Appt Reminder 2 Appt. reviewed w/pt. by phone and mailed to her.

## 2010-12-18 NOTE — Letter (Signed)
Summary: [facility]  [facility]   Imported By: Sherian Rein 10/08/2010 14:25:15  _____________________________________________________________________  External Attachment:    Type:   Image     Comment:   External Document

## 2010-12-18 NOTE — Letter (Signed)
Summary: Patient Notice- Polyp Results  [facility]  [address] [place], [place] [address]   Phone: [phone]  Fax: [phone]        April 20, 2010 MRN: [id number]    [facility] [address] [place], [place]  [address]    Dear Ms. [facility],  I am pleased to inform you that the colon polyp(s) removed during your recent colonoscopy was (were) found to be benign (no cancer detected) upon pathologic examination.  I recommend you have a repeat colonoscopy examination in 5_ years to look for recurrent polyps, as having colon polyps increases your risk for having recurrent polyps or even colon cancer in the future.  Should you develop new or worsening symptoms of abdominal pain, bowel habit changes or bleeding from the rectum or bowels, please schedule an evaluation with either your primary care physician or with me.  Additional information/recommendations:  __ No further action with gastroenterology is needed at this time. Please      follow-up with your primary care physician for your other healthcare      needs.  __ Please call [phone] to schedule a return visit to review your      situation.  __ Please keep your follow-up visit as already scheduled.  __x Continue treatment plan as outlined the day of your exam.  Please call us if you are having persistent problems or have questions about your condition that have not been fully answered at this time.  Sincerely,  Louis Meckel MD  This letter has been electronically signed by your physician.  Appended Document: Patient Notice- Polyp Results letter mailed.

## 2010-12-18 NOTE — Progress Notes (Signed)
Summary: Hospital Results Request   Phone Note Call from Patient Call back at Home Phone [phone]   Caller: Patient Call For: Dr. Arlyce Dice Reason for Call: Talk to Nurse Summary of Call: would like labwork and biopsy results Initial call taken by: Vallarie Mare,  March 14, 2010 2:42 PM  Follow-up for Phone Call        Pt. wants the test and lab results done by Dr.Jacobs in the hospital.  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  the paracentesis cytology showed some "atypical cells" but not enough to definitively state there is cancer still present in abd.  she should have f/u with Dr. Arlyce Dice in next few weeks to see if her anasarca, loose stools have responded to pancreatic enzymes. Additional Follow-up by: Rachael Fee MD,  March 14, 2010 3:20 PM    Additional Follow-up for Phone Call Additional follow up Details #2::    Above MD orders reviewed with patient. Pt. to keep scheduled office visit. Pt. instructed to call back as needed.  Follow-up by: Laureen Ochs LPN,  March 14, 2010 3:48 PM

## 2010-12-18 NOTE — Procedures (Signed)
Summary: Colonoscopy  Patient: Courtney Stevens Note: All result statuses are Final unless otherwise noted.  Tests: (1) Colonoscopy (COL)   COL Colonoscopy           DONE (C)     [facility]     [address]     [place], [place]  [address]           COLONOSCOPY PROCEDURE REPORT           PATIENT:  Courtney Stevens, Courtney Stevens  MR#:  [id number]     BIRTHDATE:  1956-05-25, 54 yrs. old  GENDER:  female           ENDOSCOPIST:  Barbette Hair. Arlyce Dice, MD     Referred by:           PROCEDURE DATE:  04/13/2010     PROCEDURE:  Colonoscopy with snare polypectomy     ASA CLASS:  Class II     INDICATIONS:  s/p Whipple procedure for Klatskin tumor.  Recent     rise in tumor markers           MEDICATIONS:   Fentanyl 125 mcg IV, Versed 12 mg IV, Benadryl 50     mg IV; glucagon 0.5mg  IV           DESCRIPTION OF PROCEDURE:   After the risks benefits and     alternatives of the procedure were thoroughly explained, informed     consent was obtained.  Digital rectal exam was performed and     revealed no abnormalities.   The LB CF-[id number] endoscope     was introduced through the anus and advanced to the cecum, which     was identified by the ileocecal valve, without limitations.  The     quality of the prep was excellent, using MoviPrep.  The instrument     was then slowly withdrawn as the colon was fully examined.     <<PROCEDUREIMAGES>>           FINDINGS:  A sessile polyp was found in the sigmoid colon. It was     3 mm in size. It was found 20 cm from the point of entry. Polyp     was snared without cautery. Retrieval was successful (see image1).     snare polyp  Mild diverticulosis was found in the sigmoid colon     (see image5).  This was otherwise a normal examination of the     colon (see image2, image3, image4, and image6).   Retroflexed     views in the rectum revealed no abnormalities.    The time to     cecum =  14.75  minutes. The scope was then withdrawn (time =     6.25  min) from the  patient and the procedure completed.           COMPLICATIONS:  None           ENDOSCOPIC IMPRESSION:     1) 3 mm sessile polyp in the sigmoid colon     2) Mild diverticulosis in the sigmoid colon     3) Otherwise normal examination     RECOMMENDATIONS:     1) If the polyp(s) removed today are proven to be adenomatous     (pre-cancerous) polyps, you will need a repeat colonoscopy in 5     years. Otherwise you should continue to follow colorectal cancer     screening guidelines for "routine risk" patients with colonoscopy  in 10 years.           REPEAT EXAM:   You will receive a letter from Dr. Arlyce Dice in 1-2     weeks, after reviewing the final pathology, with followup     recommendations.           ______________________________     Barbette Hair Arlyce Dice, MD           CC: Lucky Cowboy, MD, Thalia Party, MD           n.     REVISED:  04/13/2010 04:01 PM     eSIGNED:   Barbette Hair. Raelin Pixler at 04/13/2010 04:01 PM           Varney Biles, [id number]  Note: An exclamation mark (!) indicates a result that was not dispersed into the flowsheet. Document Creation Date: 04/13/2010 4:02 PM _______________________________________________________________________  (1) Order result status: Final Collection or observation date-time: 04/13/2010 15:22 Requested date-time:  Receipt date-time:  Reported date-time:  Referring Physician:   Ordering Physician: Melvia Heaps [phone]) Specimen Source:  Source: Launa Grill Order Number: [id number] Lab site:   Appended Document: Colonoscopy     Procedures Next Due Date:    Colonoscopy: 03/2015

## 2010-12-18 NOTE — Letter (Signed)
[facility]   Imported By: Sherian Rein 05/01/2010 15:18:41  _____________________________________________________________________  External Attachment:    Type:   Image     Comment:   External Document

## 2010-12-18 NOTE — Procedures (Signed)
Summary: [facility]   Imported By: Sherian Rein 04/04/2010 07:52:39  _____________________________________________________________________  External Attachment:    Type:   Image     Comment:   External Document

## 2010-12-18 NOTE — Letter (Signed)
Summary: Results Letter  [facility]  [address] [place], [place] [address]   Phone: [phone]  Fax: [phone]        April 20, 2010 MRN: [id number]    [facility] [address] [place], [place]  [address]    Dear Ms. [facility],   Your stomach biopsies demonstrated inflammatory changes only.    Please follow the recommendations previously discussed.  Should you have any immediate concerns or questions, feel free to contact me at the office.    Sincerely,  Barbette Hair. Arlyce Dice, M.D., [facility]          Sincerely,  Louis Meckel MD  This letter has been electronically signed by your physician.  Appended Document: Results Letter letter mailed.

## 2010-12-18 NOTE — Letter (Signed)
[facility]   Imported By: Sherian Rein 06/21/2010 13:25:59  _____________________________________________________________________  External Attachment:    Type:   Image     Comment:   External Document

## 2010-12-31 ENCOUNTER — Other Ambulatory Visit: Payer: Self-pay | Admitting: Hematology and Oncology

## 2010-12-31 ENCOUNTER — Encounter (HOSPITAL_BASED_OUTPATIENT_CLINIC_OR_DEPARTMENT_OTHER): Payer: BC Managed Care – PPO | Admitting: Hematology and Oncology

## 2010-12-31 DIAGNOSIS — C240 Malignant neoplasm of extrahepatic bile duct: Secondary | ICD-10-CM

## 2010-12-31 LAB — COMPREHENSIVE METABOLIC PANEL
ALT: 16 U/L (ref 0–35)
BUN: 16 mg/dL (ref 6–23)
CO2: 34 mEq/L — ABNORMAL HIGH (ref 19–32)
Creatinine, Ser: 1.11 mg/dL (ref 0.40–1.20)
Total Bilirubin: 0.3 mg/dL (ref 0.3–1.2)

## 2011-01-01 ENCOUNTER — Other Ambulatory Visit: Payer: Self-pay | Admitting: Hematology and Oncology

## 2011-01-01 ENCOUNTER — Ambulatory Visit (HOSPITAL_COMMUNITY)
Admission: RE | Admit: 2011-01-01 | Discharge: 2011-01-01 | Disposition: A | Discharge status: Home / Self Care | Payer: BC Managed Care – PPO | Source: Ambulatory Visit | Attending: Hematology and Oncology | Admitting: Hematology and Oncology

## 2011-01-01 DIAGNOSIS — N269 Renal sclerosis, unspecified: Secondary | ICD-10-CM | POA: Insufficient documentation

## 2011-01-01 DIAGNOSIS — C240 Malignant neoplasm of extrahepatic bile duct: Secondary | ICD-10-CM

## 2011-01-01 DIAGNOSIS — Z923 Personal history of irradiation: Secondary | ICD-10-CM | POA: Insufficient documentation

## 2011-01-01 MED ORDER — IOHEXOL 300 MG/ML  SOLN
125.0000 mL | Freq: Once | INTRAMUSCULAR | Status: AC | PRN
  Administered 2011-01-01: 125 mL via INTRAVENOUS

## 2011-01-09 NOTE — Letter (Signed)
Summary: [facility]  [facility]   Imported By: Sherian Rein 01/03/2011 14:41:43  _____________________________________________________________________  External Attachment:    Type:   Image     Comment:   External Document

## 2011-02-03 LAB — POCT I-STAT, CHEM 8
BUN: 25 mg/dL — ABNORMAL HIGH (ref 6–23)
Creatinine, Ser: 0.9 mg/dL (ref 0.4–1.2)
Glucose, Bld: 104 mg/dL — ABNORMAL HIGH (ref 70–99)
Potassium: 2.9 mEq/L — ABNORMAL LOW (ref 3.5–5.1)
Sodium: 134 mEq/L — ABNORMAL LOW (ref 135–145)

## 2011-02-04 LAB — GLUCOSE, CAPILLARY: Glucose-Capillary: 125 mg/dL — ABNORMAL HIGH (ref 70–99)

## 2011-02-05 LAB — CBC
HCT: 23.3 % — ABNORMAL LOW (ref 36.0–46.0)
HCT: 26 % — ABNORMAL LOW (ref 36.0–46.0)
HCT: 28.1 % — ABNORMAL LOW (ref 36.0–46.0)
HCT: 29 % — ABNORMAL LOW (ref 36.0–46.0)
HCT: 31.1 % — ABNORMAL LOW (ref 36.0–46.0)
HCT: 32.5 % — ABNORMAL LOW (ref 36.0–46.0)
HCT: 33.1 % — ABNORMAL LOW (ref 36.0–46.0)
Hemoglobin: 10.6 g/dL — ABNORMAL LOW (ref 12.0–15.0)
Hemoglobin: 11.1 g/dL — ABNORMAL LOW (ref 12.0–15.0)
Hemoglobin: 11.3 g/dL — ABNORMAL LOW (ref 12.0–15.0)
Hemoglobin: 7.9 g/dL — ABNORMAL LOW (ref 12.0–15.0)
Hemoglobin: 9.9 g/dL — ABNORMAL LOW (ref 12.0–15.0)
MCHC: 33.3 g/dL (ref 30.0–36.0)
MCHC: 34 g/dL (ref 30.0–36.0)
MCHC: 34 g/dL (ref 30.0–36.0)
MCHC: 34.1 g/dL (ref 30.0–36.0)
MCHC: 34.4 g/dL (ref 30.0–36.0)
MCV: 101.7 fL — ABNORMAL HIGH (ref 78.0–100.0)
MCV: 104.7 fL — ABNORMAL HIGH (ref 78.0–100.0)
MCV: 105.2 fL — ABNORMAL HIGH (ref 78.0–100.0)
MCV: 105.8 fL — ABNORMAL HIGH (ref 78.0–100.0)
Platelets: 104 10*3/uL — ABNORMAL LOW (ref 150–400)
Platelets: 118 10*3/uL — ABNORMAL LOW (ref 150–400)
Platelets: 142 10*3/uL — ABNORMAL LOW (ref 150–400)
Platelets: 172 10*3/uL (ref 150–400)
Platelets: 191 10*3/uL (ref 150–400)
RBC: 2.49 MIL/uL — ABNORMAL LOW (ref 3.87–5.11)
RBC: 2.76 MIL/uL — ABNORMAL LOW (ref 3.87–5.11)
RBC: 3.06 MIL/uL — ABNORMAL LOW (ref 3.87–5.11)
RBC: 3.26 MIL/uL — ABNORMAL LOW (ref 3.87–5.11)
RBC: 3.29 MIL/uL — ABNORMAL LOW (ref 3.87–5.11)
RDW: 15.8 % — ABNORMAL HIGH (ref 11.5–15.5)
RDW: 15.9 % — ABNORMAL HIGH (ref 11.5–15.5)
RDW: 16.3 % — ABNORMAL HIGH (ref 11.5–15.5)
RDW: 16.3 % — ABNORMAL HIGH (ref 11.5–15.5)
RDW: 17 % — ABNORMAL HIGH (ref 11.5–15.5)
RDW: 18.4 % — ABNORMAL HIGH (ref 11.5–15.5)
WBC: 3.2 10*3/uL — ABNORMAL LOW (ref 4.0–10.5)
WBC: 3.5 10*3/uL — ABNORMAL LOW (ref 4.0–10.5)
WBC: 3.5 10*3/uL — ABNORMAL LOW (ref 4.0–10.5)
WBC: 9.7 10*3/uL (ref 4.0–10.5)

## 2011-02-05 LAB — BASIC METABOLIC PANEL
CO2: 34 mEq/L — ABNORMAL HIGH (ref 19–32)
Calcium: 8.1 mg/dL — ABNORMAL LOW (ref 8.4–10.5)
Chloride: 101 mEq/L (ref 96–112)
GFR calc Af Amer: >60 mL/min (ref >60)
GFR calc Af Amer: >60 mL/min (ref >60)
GFR calc non Af Amer: 57 mL/min — ABNORMAL LOW (ref >60)
GFR calc non Af Amer: 60 mL/min — ABNORMAL LOW (ref >60)
Glucose, Bld: 102 mg/dL — ABNORMAL HIGH (ref 70–99)
Potassium: 3.1 mEq/L — ABNORMAL LOW (ref 3.5–5.1)
Potassium: 3.1 mEq/L — ABNORMAL LOW (ref 3.5–5.1)
Potassium: 3.6 mEq/L (ref 3.5–5.1)
Sodium: 141 mEq/L (ref 135–145)
Sodium: 142 mEq/L (ref 135–145)
Sodium: 144 mEq/L (ref 135–145)

## 2011-02-05 LAB — URINALYSIS, ROUTINE W REFLEX MICROSCOPIC
Bilirubin Urine: NEGATIVE
Glucose, UA: NEGATIVE mg/dL
Hgb urine dipstick: NEGATIVE
pH: 5.5 (ref 5.0–8.0)

## 2011-02-05 LAB — GLUCOSE, CAPILLARY
Glucose-Capillary: 100 mg/dL — ABNORMAL HIGH (ref 70–99)
Glucose-Capillary: 103 mg/dL — ABNORMAL HIGH (ref 70–99)
Glucose-Capillary: 110 mg/dL — ABNORMAL HIGH (ref 70–99)
Glucose-Capillary: 140 mg/dL — ABNORMAL HIGH (ref 70–99)
Glucose-Capillary: 146 mg/dL — ABNORMAL HIGH (ref 70–99)
Glucose-Capillary: 159 mg/dL — ABNORMAL HIGH (ref 70–99)
Glucose-Capillary: 161 mg/dL — ABNORMAL HIGH (ref 70–99)
Glucose-Capillary: 164 mg/dL — ABNORMAL HIGH (ref 70–99)
Glucose-Capillary: 190 mg/dL — ABNORMAL HIGH (ref 70–99)
Glucose-Capillary: 206 mg/dL — ABNORMAL HIGH (ref 70–99)
Glucose-Capillary: 231 mg/dL — ABNORMAL HIGH (ref 70–99)
Glucose-Capillary: 294 mg/dL — ABNORMAL HIGH (ref 70–99)
Glucose-Capillary: 299 mg/dL — ABNORMAL HIGH (ref 70–99)
Glucose-Capillary: 88 mg/dL (ref 70–99)
Glucose-Capillary: 96 mg/dL (ref 70–99)
Glucose-Capillary: 97 mg/dL (ref 70–99)
Glucose-Capillary: 97 mg/dL (ref 70–99)

## 2011-02-05 LAB — CROSSMATCH: ABO/RH(D): B POS

## 2011-02-05 LAB — COMPREHENSIVE METABOLIC PANEL
ALT: 35 U/L (ref 0–35)
ALT: 40 U/L — ABNORMAL HIGH (ref 0–35)
ALT: 44 U/L — ABNORMAL HIGH (ref 0–35)
ALT: 55 U/L — ABNORMAL HIGH (ref 0–35)
AST: 29 U/L (ref 0–37)
AST: 49 U/L — ABNORMAL HIGH (ref 0–37)
Albumin: 1.8 g/dL — ABNORMAL LOW (ref 3.5–5.2)
Albumin: 2.7 g/dL — ABNORMAL LOW (ref 3.5–5.2)
BUN: 15 mg/dL (ref 6–23)
BUN: 9 mg/dL (ref 6–23)
CO2: 27 mEq/L (ref 19–32)
CO2: 34 mEq/L — ABNORMAL HIGH (ref 19–32)
Calcium: 7.2 mg/dL — ABNORMAL LOW (ref 8.4–10.5)
Calcium: 7.3 mg/dL — ABNORMAL LOW (ref 8.4–10.5)
Calcium: 7.5 mg/dL — ABNORMAL LOW (ref 8.4–10.5)
Calcium: 7.6 mg/dL — ABNORMAL LOW (ref 8.4–10.5)
Calcium: 8.4 mg/dL (ref 8.4–10.5)
Chloride: 97 mEq/L (ref 96–112)
Creatinine, Ser: 0.98 mg/dL (ref 0.4–1.2)
Creatinine, Ser: 1.1 mg/dL (ref 0.4–1.2)
Creatinine, Ser: 1.11 mg/dL (ref 0.4–1.2)
Creatinine, Ser: 1.12 mg/dL (ref 0.4–1.2)
GFR calc Af Amer: >60 mL/min (ref >60)
GFR calc Af Amer: >60 mL/min (ref >60)
GFR calc non Af Amer: 51 mL/min — ABNORMAL LOW (ref >60)
GFR calc non Af Amer: 58 mL/min — ABNORMAL LOW (ref >60)
GFR calc non Af Amer: 59 mL/min — ABNORMAL LOW (ref >60)
Glucose, Bld: 100 mg/dL — ABNORMAL HIGH (ref 70–99)
Glucose, Bld: 116 mg/dL — ABNORMAL HIGH (ref 70–99)
Glucose, Bld: 126 mg/dL — ABNORMAL HIGH (ref 70–99)
Sodium: 133 mEq/L — ABNORMAL LOW (ref 135–145)
Sodium: 137 mEq/L (ref 135–145)
Sodium: 137 mEq/L (ref 135–145)
Sodium: 139 mEq/L (ref 135–145)
Total Bilirubin: 0.9 mg/dL (ref 0.3–1.2)
Total Protein: 3.5 g/dL — ABNORMAL LOW (ref 6.0–8.3)
Total Protein: 3.7 g/dL — ABNORMAL LOW (ref 6.0–8.3)
Total Protein: 4.1 g/dL — ABNORMAL LOW (ref 6.0–8.3)
Total Protein: 4.2 g/dL — ABNORMAL LOW (ref 6.0–8.3)

## 2011-02-05 LAB — LIPID PANEL
HDL: 13 mg/dL — ABNORMAL LOW (ref >39)
Total CHOL/HDL Ratio: 4.1 RATIO
Triglycerides: 58 mg/dL (ref <150)
VLDL: 12 mg/dL (ref 0–40)

## 2011-02-05 LAB — CULTURE, BLOOD (ROUTINE X 2): Culture: NO GROWTH

## 2011-02-05 LAB — PROTIME-INR: Prothrombin Time: 15.5 seconds — ABNORMAL HIGH (ref 11.6–15.2)

## 2011-02-05 LAB — MAGNESIUM
Magnesium: 1.9 mg/dL (ref 1.5–2.5)
Magnesium: 2 mg/dL (ref 1.5–2.5)
Magnesium: 2 mg/dL (ref 1.5–2.5)
Magnesium: 2.4 mg/dL (ref 1.5–2.5)

## 2011-02-05 LAB — DIFFERENTIAL
Eosinophils Absolute: 0 10*3/uL (ref 0.0–0.7)
Eosinophils Relative: 0 % (ref 0–5)
Lymphocytes Relative: 7 % — ABNORMAL LOW (ref 12–46)
Lymphs Abs: 0.7 10*3/uL (ref 0.7–4.0)
Monocytes Absolute: 0.5 10*3/uL (ref 0.1–1.0)

## 2011-02-05 LAB — URIC ACID: Uric Acid, Serum: 6.4 mg/dL (ref 2.4–7.0)

## 2011-02-05 LAB — URINE CULTURE
Colony Count: >=100000
Special Requests: NEGATIVE

## 2011-02-05 LAB — OSMOLALITY, STOOL: Osmolality,Stl: 564 mOsm

## 2011-02-05 LAB — T3, FREE: T3, Free: 1.8 pg/mL — ABNORMAL LOW (ref 2.3–4.2)

## 2011-02-05 LAB — TSH: TSH: 14.035 u[IU]/mL — ABNORMAL HIGH (ref 0.350–4.500)

## 2011-02-05 LAB — T4, FREE: Free T4: 1.04 ng/dL (ref 0.80–1.80)

## 2011-02-05 LAB — LACTATE DEHYDROGENASE: LDH: 140 U/L (ref 94–250)

## 2011-02-05 LAB — URINE MICROSCOPIC-ADD ON

## 2011-02-05 LAB — SODIUM, STOOL: Sodium, Stl: 27.2 mEq/L

## 2011-02-10 LAB — C-REACTIVE PROTEIN: CRP: 0.3 mg/dL — ABNORMAL LOW (ref <0.6)

## 2011-02-10 LAB — COMPREHENSIVE METABOLIC PANEL
ALT: 35 U/L (ref 0–35)
ALT: 46 U/L — ABNORMAL HIGH (ref 0–35)
AST: 51 U/L — ABNORMAL HIGH (ref 0–37)
Alkaline Phosphatase: 100 U/L (ref 39–117)
BUN: 12 mg/dL (ref 6–23)
BUN: 8 mg/dL (ref 6–23)
CO2: 31 mEq/L (ref 19–32)
CO2: 32 mEq/L (ref 19–32)
Calcium: 7.1 mg/dL — ABNORMAL LOW (ref 8.4–10.5)
Calcium: 7.6 mg/dL — ABNORMAL LOW (ref 8.4–10.5)
Chloride: 88 mEq/L — ABNORMAL LOW (ref 96–112)
Creatinine, Ser: 1.12 mg/dL (ref 0.4–1.2)
Creatinine, Ser: 1.43 mg/dL — ABNORMAL HIGH (ref 0.4–1.2)
GFR calc Af Amer: 28 mL/min — ABNORMAL LOW (ref >60)
GFR calc non Af Amer: 23 mL/min — ABNORMAL LOW (ref >60)
GFR calc non Af Amer: 38 mL/min — ABNORMAL LOW (ref >60)
Glucose, Bld: 105 mg/dL — ABNORMAL HIGH (ref 70–99)
Glucose, Bld: 54 mg/dL — ABNORMAL LOW (ref 70–99)
Potassium: 3.4 mEq/L — ABNORMAL LOW (ref 3.5–5.1)
Sodium: 130 mEq/L — ABNORMAL LOW (ref 135–145)
Sodium: 134 mEq/L — ABNORMAL LOW (ref 135–145)
Total Protein: 3.6 g/dL — ABNORMAL LOW (ref 6.0–8.3)
Total Protein: 3.7 g/dL — ABNORMAL LOW (ref 6.0–8.3)

## 2011-02-10 LAB — CARDIAC PANEL(CRET KIN+CKTOT+MB+TROPI)
CK, MB: 1 ng/mL (ref 0.3–4.0)
CK, MB: 1.1 ng/mL (ref 0.3–4.0)
Relative Index: INVALID (ref 0.0–2.5)
Total CK: 41 U/L (ref 7–177)
Troponin I: <0.01 ng/mL (ref 0.00–0.06)

## 2011-02-10 LAB — CBC
HCT: 25.7 % — ABNORMAL LOW (ref 36.0–46.0)
HCT: 26.4 % — ABNORMAL LOW (ref 36.0–46.0)
Hemoglobin: 8.8 g/dL — ABNORMAL LOW (ref 12.0–15.0)
Hemoglobin: 9.1 g/dL — ABNORMAL LOW (ref 12.0–15.0)
MCHC: 34 g/dL (ref 30.0–36.0)
MCV: 98.9 fL (ref 78.0–100.0)
MCV: 99.3 fL (ref 78.0–100.0)
MCV: 99.6 fL (ref 78.0–100.0)
Platelets: 159 10*3/uL (ref 150–400)
Platelets: 234 10*3/uL (ref 150–400)
Platelets: 367 10*3/uL (ref 150–400)
RBC: 2.76 MIL/uL — ABNORMAL LOW (ref 3.87–5.11)
RDW: 14.5 % (ref 11.5–15.5)
RDW: 15.1 % (ref 11.5–15.5)
RDW: 15.1 % (ref 11.5–15.5)
WBC: 2.4 10*3/uL — ABNORMAL LOW (ref 4.0–10.5)
WBC: 3.1 10*3/uL — ABNORMAL LOW (ref 4.0–10.5)
WBC: 6.7 10*3/uL (ref 4.0–10.5)

## 2011-02-10 LAB — URINALYSIS, ROUTINE W REFLEX MICROSCOPIC
Bilirubin Urine: NEGATIVE
Glucose, UA: NEGATIVE mg/dL
Hgb urine dipstick: NEGATIVE
Specific Gravity, Urine: 1.009 (ref 1.005–1.030)
pH: 7 (ref 5.0–8.0)

## 2011-02-10 LAB — GLUCOSE, CAPILLARY
Glucose-Capillary: 102 mg/dL — ABNORMAL HIGH (ref 70–99)
Glucose-Capillary: 116 mg/dL — ABNORMAL HIGH (ref 70–99)
Glucose-Capillary: 127 mg/dL — ABNORMAL HIGH (ref 70–99)
Glucose-Capillary: 128 mg/dL — ABNORMAL HIGH (ref 70–99)
Glucose-Capillary: 133 mg/dL — ABNORMAL HIGH (ref 70–99)
Glucose-Capillary: 180 mg/dL — ABNORMAL HIGH (ref 70–99)
Glucose-Capillary: 186 mg/dL — ABNORMAL HIGH (ref 70–99)
Glucose-Capillary: 40 mg/dL — CL (ref 70–99)
Glucose-Capillary: 52 mg/dL — ABNORMAL LOW (ref 70–99)
Glucose-Capillary: 77 mg/dL (ref 70–99)

## 2011-02-10 LAB — BASIC METABOLIC PANEL
BUN: 8 mg/dL (ref 6–23)
BUN: 8 mg/dL (ref 6–23)
CO2: 32 mEq/L (ref 19–32)
Calcium: 6.9 mg/dL — ABNORMAL LOW (ref 8.4–10.5)
Chloride: 103 mEq/L (ref 96–112)
Chloride: 105 mEq/L (ref 96–112)
Creatinine, Ser: 1.56 mg/dL — ABNORMAL HIGH (ref 0.4–1.2)
GFR calc Af Amer: 42 mL/min — ABNORMAL LOW (ref >60)
GFR calc Af Amer: >60 mL/min (ref >60)
GFR calc non Af Amer: 34 mL/min — ABNORMAL LOW (ref >60)
Glucose, Bld: 75 mg/dL (ref 70–99)
Glucose, Bld: 95 mg/dL (ref 70–99)
Potassium: 3.5 mEq/L (ref 3.5–5.1)
Potassium: 4 mEq/L (ref 3.5–5.1)
Sodium: 131 mEq/L — ABNORMAL LOW (ref 135–145)

## 2011-02-10 LAB — CULTURE, BLOOD (ROUTINE X 2)
Culture: NO GROWTH
Culture: NO GROWTH

## 2011-02-10 LAB — LIPID PANEL
Cholesterol: 46 mg/dL (ref 0–200)
HDL: 11 mg/dL — ABNORMAL LOW (ref >39)
LDL Cholesterol: 20 mg/dL (ref 0–99)
Total CHOL/HDL Ratio: 4.2 RATIO
Triglycerides: 76 mg/dL (ref <150)

## 2011-02-10 LAB — FOLATE RBC: RBC Folate: 771 ng/mL — ABNORMAL HIGH (ref 180–600)

## 2011-02-10 LAB — MAGNESIUM
Magnesium: 2 mg/dL (ref 1.5–2.5)
Magnesium: 2.1 mg/dL (ref 1.5–2.5)
Magnesium: 2.3 mg/dL (ref 1.5–2.5)

## 2011-02-10 LAB — PHOSPHORUS: Phosphorus: 2.4 mg/dL (ref 2.3–4.6)

## 2011-02-10 LAB — DIFFERENTIAL
Basophils Absolute: 0 10*3/uL (ref 0.0–0.1)
Eosinophils Absolute: 0 10*3/uL (ref 0.0–0.7)
Eosinophils Absolute: 0 10*3/uL (ref 0.0–0.7)
Eosinophils Relative: 0 % (ref 0–5)
Eosinophils Relative: 1 % (ref 0–5)
Lymphs Abs: 0.5 10*3/uL — ABNORMAL LOW (ref 0.7–4.0)
Lymphs Abs: 0.9 10*3/uL (ref 0.7–4.0)
Lymphs Abs: 0.9 10*3/uL (ref 0.7–4.0)
Monocytes Absolute: 0.3 10*3/uL (ref 0.1–1.0)
Monocytes Relative: 13 % — ABNORMAL HIGH (ref 3–12)
Neutro Abs: 1.5 10*3/uL — ABNORMAL LOW (ref 1.7–7.7)
Neutrophils Relative %: 56 % (ref 43–77)
Neutrophils Relative %: 64 % (ref 43–77)

## 2011-02-10 LAB — TSH: TSH: 6.276 u[IU]/mL — ABNORMAL HIGH (ref 0.350–4.500)

## 2011-02-10 LAB — CREATININE, URINE, RANDOM: Creatinine, Urine: 22.8 mg/dL

## 2011-02-10 LAB — URINE CULTURE: Colony Count: >=100000

## 2011-02-10 LAB — URINE MICROSCOPIC-ADD ON

## 2011-02-10 LAB — LIPASE, BLOOD: Lipase: 12 U/L (ref 11–59)

## 2011-02-10 LAB — SODIUM, URINE, RANDOM: Sodium, Ur: 23 mEq/L

## 2011-02-10 LAB — TROPONIN I: Troponin I: 0.02 ng/mL (ref 0.00–0.06)

## 2011-02-10 LAB — SEDIMENTATION RATE: Sed Rate: 0 mm/hr (ref 0–22)

## 2011-02-10 LAB — HEMOGLOBIN A1C: Mean Plasma Glucose: 126 mg/dL

## 2011-02-10 LAB — BRAIN NATRIURETIC PEPTIDE: Pro B Natriuretic peptide (BNP): <30 pg/mL (ref 0.0–100.0)

## 2011-02-10 LAB — FERRITIN: Ferritin: 186 ng/mL (ref 10–291)

## 2011-02-22 ENCOUNTER — Encounter (HOSPITAL_BASED_OUTPATIENT_CLINIC_OR_DEPARTMENT_OTHER): Payer: BC Managed Care – PPO | Admitting: Hematology and Oncology

## 2011-02-22 ENCOUNTER — Other Ambulatory Visit (HOSPITAL_COMMUNITY): Payer: Self-pay

## 2011-02-22 ENCOUNTER — Other Ambulatory Visit: Payer: Self-pay | Admitting: Hematology and Oncology

## 2011-02-22 DIAGNOSIS — Z452 Encounter for adjustment and management of vascular access device: Secondary | ICD-10-CM

## 2011-02-22 DIAGNOSIS — C240 Malignant neoplasm of extrahepatic bile duct: Secondary | ICD-10-CM

## 2011-02-22 LAB — COMPREHENSIVE METABOLIC PANEL
AST: 25 U/L (ref 0–37)
Alkaline Phosphatase: 88 U/L (ref 39–117)
BUN: 16 mg/dL (ref 6–23)
Calcium: 9.2 mg/dL (ref 8.4–10.5)
Chloride: 104 mEq/L (ref 96–112)
Creatinine, Ser: 0.82 mg/dL (ref 0.40–1.20)

## 2011-02-22 LAB — CBC WITH DIFFERENTIAL/PLATELET
Basophils Absolute: 0 10*3/uL (ref 0.0–0.1)
EOS%: 0.5 % (ref 0.0–7.0)
HCT: 32.3 % — ABNORMAL LOW (ref 34.8–46.6)
HGB: 10.6 g/dL — ABNORMAL LOW (ref 11.6–15.9)
MCH: 30.1 pg (ref 25.1–34.0)
MCV: 91.9 fL (ref 79.5–101.0)
MONO%: 8 % (ref 0.0–14.0)
NEUT%: 79.8 % — ABNORMAL HIGH (ref 38.4–76.8)

## 2011-02-22 LAB — CANCER ANTIGEN 19-9: CA 19-9: 46.4 U/mL — ABNORMAL HIGH (ref <35.0)

## 2011-02-24 LAB — GLUCOSE, CAPILLARY: Glucose-Capillary: 91 mg/dL (ref 70–99)

## 2011-02-27 ENCOUNTER — Encounter (HOSPITAL_BASED_OUTPATIENT_CLINIC_OR_DEPARTMENT_OTHER): Payer: BC Managed Care – PPO | Admitting: Hematology and Oncology

## 2011-02-27 DIAGNOSIS — C240 Malignant neoplasm of extrahepatic bile duct: Secondary | ICD-10-CM

## 2011-02-28 ENCOUNTER — Other Ambulatory Visit (HOSPITAL_COMMUNITY): Payer: Self-pay

## 2011-04-02 NOTE — Assessment & Plan Note (Signed)
[facility]                         GASTROENTEROLOGY OFFICE NOTE   NAME:Courtney Stevens, ANGELAMARIE AVAKIAN                        MRN:          [id number]  DATE:01/20/2008                            DOB:          12/09/1955    PROBLEM:  Abnormal liver tests and CT.   Mrs. Gammage is a 55 year old white female, referred through the courtesy  of Dr. Oneta Rack for evaluation.  For several months, she has been  complaining of pyrosis and postprandial moderately severe right upper  quadrant and lower chest pain, lasting minutes.  This prompted blood  work, which showed abnormal liver tests.  CT demonstrated dilatation of  her intrahepatic bile ducts.  She denies fever or jaundice.  She is  status post cholecystectomy 20 years ago for gallbladder stones.  She  has also had gastric bypass surgery.   On December 16, 2007, alkaline phosphatase was 765, AST 219 and ALT 156.   Her weight has been stable.  Aside from her pain, she has been feeling  well.   PAST MEDICAL HISTORY:  Pertinent for hypertension and asthma.  She has  had thyroid disease and arthritis.  She is status post hysterectomy,  tubal ligation, appendectomy and cholecystectomy.   FAMILY HISTORY:  Pertinent for father with a bleeding disorder.   MEDICATIONS:  Include Accolate, Zyrtec, omeprazole, Maxzide,  levothyroxine and several vitamins.   She has no allergies.   She does not smoke.  She drinks rarely.  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   REVIEW OF SYSTEMS:  Otherwise negative, except for muscle cramps.   PHYSICAL EXAMINATION:  Pulse 70, blood pressure 134/88, weight 177.  HEENT: EOMI.  PERRLA.  Sclerae are anicteric.  Conjunctivae are pink.  NECK:  Supple without thyromegaly, adenopathy or carotid bruits.  CHEST:  Clear to auscultation and percussion without adventitious  sounds.  CARDIAC:  Regular rhythm; normal S1 S2.  There are no murmurs, gallops  or rubs.  ABDOMEN:  Bowel sounds are  normoactive.  Abdomen is soft, nontender and  nondistended.  There are no abdominal masses, tenderness, splenic  enlargement or hepatomegaly.  EXTREMITIES:  Full range of motion.  No cyanosis, clubbing or edema.  RECTAL:  Deferred.   IMPRESSION:  1. Abnormal liver tests with CT evidence for intrahepatic duct      obstruction.  Signs and symptoms are suggestive of a retained bile      duct stone.  Neoplasm is another consideration, though less likely.  2. Status post gastric bypass surgery.  3. GERD.   RECOMMENDATION:  1. MRCP.  2. Review previous x-rays, previous surgical note to help clarify her      anatomy.  3. Continue omeprazole.     Barbette Hair. Arlyce Dice, MD,FACG  Electronically Signed    RDK/MedQ  DD: 01/20/2008  DT: 01/20/2008  Job #: [id number]   cc:   Lucky Cowboy, M.D.

## 2011-04-02 NOTE — H&P (Signed)
Courtney Stevens, Courtney Stevens                 ACCOUNT NO.:  [id number]   MEDICAL RECORD NO.:  [id number]          PATIENT TYPE:  INP   LOCATION:                               FACILITY:  [facility]   PHYSICIAN:  Courtney Hair. Arlyce Dice, MD,FACGDATE OF BIRTH:  10-Sep-1956   DATE OF ADMISSION:  02/02/2008  DATE OF DISCHARGE:                              HISTORY & PHYSICAL   REASON FOR ADMISSION:  Obstructive jaundice.   Courtney Stevens is a 54 year old white female with several months of  intermittent post prandial right upper quadrant pain.  Abnormal liver  tests prompted a CT that demonstrated intrahepatic ductal dilatation.  She is status post cholecystectomy 20 years ago and gastric bypass  surgery for obesity approximately five years ago.  On December 16, 2007,  alkaline phosphatase was 785, AST 219, and ALT 156.  MRCP confirmed the  presence of intrahepatic biliary dilatation down to the area of the  porta hepatis.  There is also mild portal and celiac access enlarged  lymph nodes and an ill-defined soft tissue density in the region of the  porta.  No pancreatic abnormalities were seen.  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   PAST MEDICAL HISTORY:  Pertinent for hypertension and asthma.  She has  thyroid disease and arthritis.  She is status post hysterectomy, tubal  ligation, appendectomy, and cholecystectomy.   FAMILY HISTORY:  Pertinent for a father with a bleeding disorder.   MEDICATIONS:  Accolate, Zyrtec, omeprazole, Maxzide, levothyroxine, and  several vitamins.   She has no allergies.   She does not smoke.  She drinks rarely.  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   REVIEW OF SYSTEMS:  Positive for muscle cramps.   PHYSICAL EXAMINATION:  She is a healthy-appearing female.  Pulse 70, blood pressure 134/88, weight 177.  She is anicteric.  HEENT exam is within normal limits.  NECK:  Supple without thyromegaly, adenopathy, or  bruits.  CHEST:  Clear.  There are no heart murmurs, rubs or gallops.  ABDOMEN:  Abdomen is soft, nontender and nondistended.  There are no  abdominal masses, tenderness, or organomegaly.  EXTREMITIES:  Do not demonstrate clubbing, cyanosis or edema.   IMPRESSION:  1. Bile duct obstruction at the level of the porta hepatis.      Radiographic findings are worrisome for a Klatskin tumor.  A      retained bile duct stone is another consideration.  2. Status post gastric bypass surgery.  3. Gastroesophageal reflux disease.  4. Hypertension.  5. Asthma.   RECOMMENDATIONS:  1. MRI for further clarification of her radiographic abnormalities.  2. Percutaneous transhepatic cholangiogram.  I anticipate she will      require an external drainage until it is determined what is causing      her obstruction.  3. Possible EUS, pending results of above.   Patient is going to be admitted on March 17th.      Courtney Hair. Arlyce Dice, MD,FACG  Electronically Signed  RDK/MEDQ  D:  01/28/2008  T:  01/28/2008  Job:  [id number]   cc:   Courtney Stevens, M.D.  Fax: [phone]

## 2011-04-02 NOTE — Letter (Signed)
January 20, 2008    Lucky Cowboy, M.D.  [address]  [place], [place] [address]   RE:  HOA, BRIGGS  MRN:  [id number]  /  DOB:  July 21, 1956   Dear Dr. Oneta Rack:   Upon your kind referral, I had the pleasure of evaluating your patient  and I am pleased to offer my findings.  I saw Courtney Stevens in the office  today.  Enclosed is a copy of my progress note that details my findings  and recommendations.   Thank you for the opportunity to participate in your patient's care.    Sincerely,      Barbette Hair. Arlyce Dice, MD,FACG  Electronically Signed    RDK/MedQ  DD: 01/20/2008  DT: 01/20/2008  Job #: [id number]

## 2011-04-05 NOTE — Discharge Summary (Signed)
NAMEWARRENE, KAPFER                 ACCOUNT NO.:  [id number]   MEDICAL RECORD NO.:  [id number]          PATIENT TYPE:  INP   LOCATION:  1617                         FACILITY:  [facility]   PHYSICIAN:  Barbette Hair. Arlyce Dice, MD,FACGDATE OF BIRTH:  January 09, 1956   DATE OF ADMISSION:  02/02/2008  DATE OF DISCHARGE:  02/05/2008                               DISCHARGE SUMMARY   ADMITTING DIAGNOSES:  82. A 55 year old white female with new-onset obstructive jaundice with      CT and MRCP evidence of bile duct obstruction at the level of the      porta hepatis; rule out Klatskin tumor, rule out possible retained      common bile duct stone.  2. Status post gastric bypass.  3. Gastroesophageal reflux disease.  4. Hypertension.  5. Asthma.   DISCHARGE DIAGNOSES:  71. A 55 year old white female stable status post percutaneous      transhepatic cholangiogram and external drainage, bile duct      brushings and capping of biliary drain.  Biopsies positive for      atypical cells, not definitely diagnostic of a carcinoma.  2. Percutaneous transhepatic cholangiogram positive for narrowing of      the extrahepatic bile duct, worrisome for malignancy.  3. Gastroesophageal reflux disease.  4. Hypertension.  5. Asthma.   CONSULTATIONS:  Arn Medal, M.D., Interventional Radiology.   PROCEDURES PERFORMED:  MRI of the abdomen and percutaneous transhepatic  cholangiogram, bile duct brushings and placement of internal/external  biliary drain February 03, 2008.   BRIEF HISTORY:  Promise is a 55 year old white female with a several month  history of intermittent of postprandial right upper quadrant abdominal  pain.  She was found to have abnormal liver function studies which  prompted a CT scan that shows dilated intrahepatic ducts.  She had a  remote cholecystectomy about 20 years previous and had gastric bypass  surgery with a Roux-en-Y approximately 5 years ago.  She was seen and  evaluated by Dr. Arlyce Dice in the  office and underwent MRCP which shows  intrahepatic biliary ductal dilation down to the area of the porta  hepatis; also noted mild portal and celiac lymphadenopathy and an ill-  defined soft tissue density in the region of the porta.  At this time,  she is admitted to the hospital with plans for MRI and then percutaneous  transhepatic cholangiogram as the ERCP is not possible given her prior  gastric bypass surgery.   LABORATORY STUDIES:  On admission, WBC of 5.1, hemoglobin 14, hematocrit  of 39, MCV of 88, platelets 239.  Followup on March 20, WBC of 12.3,  hemoglobin 15, hematocrit of 42.9.  Protime 12.7, INR of 0.9.  Potassium  was 3.1 on admission, this was corrected to 3.9.  Liver function studies  on admission:  Total bilirubin 1.1, alk phos 546, ALT 96, AST of 113.  Followup on the 20th showed total bilirubin of 1.4, alk phos 401, ALT of  50, AST of 46.  CA19-9 was 33.7 normal.  CEA was mildly elevated at 6.8.   X-RAY STUDIES:  MRI  of the abdomen on March 18 showed moderate  intrahepatic biliary ductal dilation persisting just inferior to the  confluence of the left and right hepatic ducts.  There is an abrupt  transition identified and infiltrative tissue centered at the superior  most aspect of the common duct and pancreatic duodenal groove.  Prominent porta hepatis lymph nodes.  Portal vein is adjacent, but  separate from the presumed infiltrative tumor.  Branch portal veins are  patent.  Percutaneous transhepatic cholangiogram and placement of  internal/external biliary drain as described above.   HOSPITAL COURSE:  The patient was admitted to the service of Dr. Melvia Heaps.  Interventional Radiology was consulted.  She underwent MRI  initially with findings as outlined above and was then scheduled for  PTC, bile duct brushings and placement of internal/external drain which  was accomplished on February 03, 2008, per Dr. Lowella Dandy.  She tolerated the  procedure without  difficulty.  The findings at [facility] were also worrisome  for a Klatskin tumor.  By February 04, 2008, she was sore, but stable and  otherwise doing well.  Preliminary on the bile duct brushings showed  atypical cells, but no confirmation of carcinoma.  Her external drain  was capped off on the evening of the 19th.  Her bilirubin and white  count rose post capping and she was put hooked back up to external  drainage.  Plans were for daily flushes with recording of output.  Arrangements were made for the patient to see Dr. Marilynn Rail at [facility] for consideration of surgical resection.  This appointment was  made for the following week and she was discharged to home on March 20.   [facility] was going to follow her for her biliary catheter  flushes, etcetera.   DISCHARGE MEDICATIONS:  1. Accolate.  2. Zyrtec.  3. Omeprazole.  4. Maxzide.  5. Levothyroxine.  6. Vitamins as previous.      Amy Esterwood, PA-C      Robert D. Arlyce Dice, MD,FACG  Electronically Signed    AE/MEDQ  D:  02/15/2008  T:  02/15/2008  Job:  [id number]   cc:   Barbette Hair. Arlyce Dice, MD,FACG  [address]  [place]  [place] [address]   Dr. Marilynn Rail  [facility]

## 2011-04-05 NOTE — Assessment & Plan Note (Signed)
[facility]                                 ON-CALL NOTE   NAME:Courtney Stevens, Courtney Stevens                        MRN:          [id number]  DATE:02/07/2008                            DOB:          [birth year]    Courtney Stevens called. She has leakage around her external draining PTC  catheter. She has an internal/external catheter with drainage to the  outside. The catheter apparently became detached during the night, and  she has had some leakage around the tube. She has no pain or fever.   I explained this to her, and I told her that there should be no further  problems if she snugly reattaches the external draining catheter.     Barbette Hair. Arlyce Dice, MD,FACG  Electronically Signed    RDK/MedQ  DD: 02/07/2008  DT: 02/07/2008  Job #: [id number]

## 2011-05-17 ENCOUNTER — Encounter (HOSPITAL_BASED_OUTPATIENT_CLINIC_OR_DEPARTMENT_OTHER): Payer: Managed Care, Other (non HMO) | Admitting: Hematology and Oncology

## 2011-05-17 DIAGNOSIS — Z452 Encounter for adjustment and management of vascular access device: Secondary | ICD-10-CM

## 2011-05-17 DIAGNOSIS — C240 Malignant neoplasm of extrahepatic bile duct: Secondary | ICD-10-CM

## 2011-07-12 ENCOUNTER — Encounter (HOSPITAL_BASED_OUTPATIENT_CLINIC_OR_DEPARTMENT_OTHER): Payer: Managed Care, Other (non HMO) | Admitting: Hematology and Oncology

## 2011-07-12 DIAGNOSIS — Z452 Encounter for adjustment and management of vascular access device: Secondary | ICD-10-CM

## 2011-07-12 DIAGNOSIS — C240 Malignant neoplasm of extrahepatic bile duct: Secondary | ICD-10-CM

## 2011-08-12 LAB — CBC
HCT: 40.5
HCT: 42.9
Hemoglobin: 15.1 — ABNORMAL HIGH
MCV: 89
Platelets: 230
Platelets: 239
RBC: 4.51
RBC: 4.84
RDW: 13
RDW: 13
WBC: 10.7 — ABNORMAL HIGH
WBC: 12.3 — ABNORMAL HIGH
WBC: 5.1

## 2011-08-12 LAB — COMPREHENSIVE METABOLIC PANEL
ALT: 50 — ABNORMAL HIGH
AST: 113 — ABNORMAL HIGH
Albumin: 3.6
Alkaline Phosphatase: 401 — ABNORMAL HIGH
Alkaline Phosphatase: 546 — ABNORMAL HIGH
BUN: 3 — ABNORMAL LOW
CO2: 28
Chloride: 101
Chloride: 103
Creatinine, Ser: 0.78
GFR calc Af Amer: >60
GFR calc non Af Amer: >60
Glucose, Bld: 155 — ABNORMAL HIGH
Potassium: 3.1 — ABNORMAL LOW
Potassium: 3.8
Sodium: 138
Total Bilirubin: 1.1
Total Bilirubin: 1.4 — ABNORMAL HIGH
Total Protein: 6.4
Total Protein: 6.5

## 2011-08-12 LAB — DIFFERENTIAL
Basophils Absolute: 0
Eosinophils Relative: 4
Lymphocytes Relative: 30
Monocytes Absolute: 0.5
Monocytes Relative: 10

## 2011-08-12 LAB — HEPATIC FUNCTION PANEL
ALT: 63 — ABNORMAL HIGH
Bilirubin, Direct: 0.4 — ABNORMAL HIGH
Indirect Bilirubin: 0.6
Total Protein: 6.1

## 2011-08-12 LAB — PROTIME-INR
INR: 0.9
Prothrombin Time: 12.7

## 2011-08-12 LAB — CEA: CEA: 6.8 — ABNORMAL HIGH

## 2011-08-14 LAB — PROTIME-INR
INR: 1
Prothrombin Time: 13.7

## 2011-08-14 LAB — BASIC METABOLIC PANEL
CO2: 29
Calcium: 9
Creatinine, Ser: 0.69
GFR calc Af Amer: >60
GFR calc non Af Amer: >60
Sodium: 142

## 2011-08-14 LAB — CBC
Hemoglobin: 11.3 — ABNORMAL LOW
MCHC: 33.8
RBC: 3.66 — ABNORMAL LOW

## 2011-09-03 ENCOUNTER — Encounter (HOSPITAL_BASED_OUTPATIENT_CLINIC_OR_DEPARTMENT_OTHER): Payer: Managed Care, Other (non HMO) | Admitting: Hematology and Oncology

## 2011-09-03 ENCOUNTER — Other Ambulatory Visit: Payer: Self-pay | Admitting: Hematology and Oncology

## 2011-09-03 DIAGNOSIS — C240 Malignant neoplasm of extrahepatic bile duct: Secondary | ICD-10-CM

## 2011-09-03 DIAGNOSIS — Z452 Encounter for adjustment and management of vascular access device: Secondary | ICD-10-CM

## 2011-09-03 LAB — CBC WITH DIFFERENTIAL/PLATELET
BASO%: 0.2 % (ref 0.0–2.0)
EOS%: 1.8 % (ref 0.0–7.0)
HGB: 12.2 g/dL (ref 11.6–15.9)
MCH: 28.4 pg (ref 25.1–34.0)
MCHC: 32.8 g/dL (ref 31.5–36.0)
MONO#: 0.6 10*3/uL (ref 0.1–0.9)
RDW: 14.8 % — ABNORMAL HIGH (ref 11.2–14.5)
WBC: 5.1 10*3/uL (ref 3.9–10.3)
lymph#: 1.1 10*3/uL (ref 0.9–3.3)

## 2011-09-03 LAB — COMPREHENSIVE METABOLIC PANEL
ALT: 17 U/L (ref 0–35)
AST: 23 U/L (ref 0–37)
Albumin: 3.9 g/dL (ref 3.5–5.2)
CO2: 27 mEq/L (ref 19–32)
Calcium: 9.8 mg/dL (ref 8.4–10.5)
Chloride: 95 mEq/L — ABNORMAL LOW (ref 96–112)
Potassium: 3.7 mEq/L (ref 3.5–5.3)
Total Protein: 6.4 g/dL (ref 6.0–8.3)

## 2011-09-06 ENCOUNTER — Encounter (HOSPITAL_BASED_OUTPATIENT_CLINIC_OR_DEPARTMENT_OTHER): Payer: BC Managed Care – PPO | Admitting: Hematology and Oncology

## 2011-09-06 ENCOUNTER — Other Ambulatory Visit: Payer: Self-pay | Admitting: Hematology and Oncology

## 2011-09-06 DIAGNOSIS — Z452 Encounter for adjustment and management of vascular access device: Secondary | ICD-10-CM

## 2011-09-06 DIAGNOSIS — C240 Malignant neoplasm of extrahepatic bile duct: Secondary | ICD-10-CM

## 2011-09-06 DIAGNOSIS — C259 Malignant neoplasm of pancreas, unspecified: Secondary | ICD-10-CM

## 2011-09-11 ENCOUNTER — Ambulatory Visit (HOSPITAL_COMMUNITY)
Admission: RE | Admit: 2011-09-11 | Discharge: 2011-09-11 | Disposition: A | Discharge status: Home / Self Care | Payer: BC Managed Care – PPO | Source: Ambulatory Visit | Attending: Hematology and Oncology | Admitting: Hematology and Oncology

## 2011-09-11 DIAGNOSIS — C259 Malignant neoplasm of pancreas, unspecified: Secondary | ICD-10-CM

## 2011-09-11 DIAGNOSIS — Z9221 Personal history of antineoplastic chemotherapy: Secondary | ICD-10-CM | POA: Insufficient documentation

## 2011-09-11 DIAGNOSIS — C249 Malignant neoplasm of biliary tract, unspecified: Secondary | ICD-10-CM | POA: Insufficient documentation

## 2011-09-11 MED ORDER — IOHEXOL 300 MG/ML  SOLN
100.0000 mL | Freq: Once | INTRAMUSCULAR | Status: AC | PRN
  Administered 2011-09-11: 100 mL via INTRAVENOUS

## 2011-09-23 ENCOUNTER — Other Ambulatory Visit: Payer: Self-pay | Admitting: Hematology and Oncology

## 2011-09-23 DIAGNOSIS — C259 Malignant neoplasm of pancreas, unspecified: Secondary | ICD-10-CM

## 2011-09-24 ENCOUNTER — Other Ambulatory Visit: Payer: Self-pay | Admitting: Hematology and Oncology

## 2011-09-24 DIAGNOSIS — C259 Malignant neoplasm of pancreas, unspecified: Secondary | ICD-10-CM

## 2011-10-01 ENCOUNTER — Encounter (HOSPITAL_COMMUNITY): Payer: Self-pay

## 2011-10-02 ENCOUNTER — Encounter: Payer: Self-pay | Admitting: *Deleted

## 2011-10-02 ENCOUNTER — Other Ambulatory Visit: Payer: Self-pay | Admitting: Physician Assistant

## 2011-10-02 MED ORDER — DEXTROSE 5 % IV SOLN: 1.0000 g | Freq: Once | INTRAVENOUS | Status: DC

## 2011-10-02 MED ORDER — SODIUM CHLORIDE 0.9 % IV SOLN: INTRAVENOUS | Status: DC

## 2011-10-02 NOTE — Progress Notes (Signed)
Refill request for generic Vicodin 5-500 from [facility] at [address]  This Vicodin order from Dr. Dalene Carrow was prescribed and last filled 06-20-09.  MD asked why does pt. Require pain meds as no cancer noted in CT scans. Called patient's mobile and she says she is for surgery tomorrow.  Instructed her that this office will not refill and her pain medications.  Anu pain medications she needs will be prescribed by surgeon and/or her primary care provider.

## 2011-10-03 ENCOUNTER — Other Ambulatory Visit (HOSPITAL_COMMUNITY): Payer: BC Managed Care – PPO

## 2011-10-03 ENCOUNTER — Ambulatory Visit (HOSPITAL_COMMUNITY)
Admission: RE | Admit: 2011-10-03 | Discharge: 2011-10-03 | Disposition: A | Discharge status: Home / Self Care | Payer: BC Managed Care – PPO | Source: Ambulatory Visit | Attending: Hematology and Oncology | Admitting: Hematology and Oncology

## 2011-10-03 DIAGNOSIS — Z452 Encounter for adjustment and management of vascular access device: Secondary | ICD-10-CM | POA: Insufficient documentation

## 2011-10-03 DIAGNOSIS — Z9079 Acquired absence of other genital organ(s): Secondary | ICD-10-CM | POA: Insufficient documentation

## 2011-10-03 DIAGNOSIS — Z79899 Other long term (current) drug therapy: Secondary | ICD-10-CM | POA: Insufficient documentation

## 2011-10-03 DIAGNOSIS — Z9884 Bariatric surgery status: Secondary | ICD-10-CM | POA: Insufficient documentation

## 2011-10-03 DIAGNOSIS — C259 Malignant neoplasm of pancreas, unspecified: Secondary | ICD-10-CM | POA: Insufficient documentation

## 2011-10-03 DIAGNOSIS — K219 Gastro-esophageal reflux disease without esophagitis: Secondary | ICD-10-CM | POA: Insufficient documentation

## 2011-10-03 DIAGNOSIS — Z9089 Acquired absence of other organs: Secondary | ICD-10-CM | POA: Insufficient documentation

## 2011-10-03 DIAGNOSIS — E039 Hypothyroidism, unspecified: Secondary | ICD-10-CM | POA: Insufficient documentation

## 2011-10-03 DIAGNOSIS — Z90710 Acquired absence of both cervix and uterus: Secondary | ICD-10-CM | POA: Insufficient documentation

## 2011-10-03 LAB — CBC
HCT: 37 % (ref 36.0–46.0)
Hemoglobin: 12.2 g/dL (ref 12.0–15.0)
MCH: 29.1 pg (ref 26.0–34.0)
MCHC: 33 g/dL (ref 30.0–36.0)
MCV: 88.3 fL (ref 78.0–100.0)
RDW: 14.6 % (ref 11.5–15.5)

## 2011-10-03 MED ORDER — FENTANYL CITRATE 0.05 MG/ML IJ SOLN
INTRAMUSCULAR | Status: AC | PRN
  Administered 2011-10-03 (×2): 50 ug via INTRAVENOUS

## 2011-10-03 MED ORDER — MIDAZOLAM HCL 5 MG/5ML IJ SOLN
INTRAMUSCULAR | Status: AC | PRN
  Administered 2011-10-03 (×2): 2 mg via INTRAVENOUS

## 2011-10-03 MED ORDER — CEFAZOLIN SODIUM 1-5 GM-% IV SOLN
1.0000 g | INTRAVENOUS | Status: DC
  Filled 2011-10-03: qty 50

## 2011-10-03 NOTE — H&P (Signed)
Courtney Stevens is an 55 y.o. female.   Chief Complaint: " I'm here for port removal" HPI: Patient with history of bile duct adenocarcinoma (therapy completed) and prior port a cath placement presents today for port removal.    PMH: gerd,htn,dm,hypothyroid, PSH: Whipple,gastric bypass,hysterectomy,/BSO,cholecystectomy Social History:  does not have a smoking history on file. She does not have any smokeless tobacco history on file. Her alcohol and drug histories not on file.  Allergies:  Allergies  Allergen Reactions  . Povidone-Iodine     Medications Prior to Admission  Medication Sig Dispense Refill  . ALPRAZolam (XANAX) 1 MG tablet Take 1 mg by mouth at bedtime as needed. anxiety       . cetirizine (ZYRTEC) 10 MG tablet Take 10 mg by mouth every morning.        . Cinnamon 500 MG capsule Take 1,000 mg by mouth daily.        . Cyanocobalamin (VITAMIN B 12 PO) Take 1 tablet by mouth every Monday, Wednesday, and Friday.       . glyBURIDE (DIABETA) 5 MG tablet Take 5 mg by mouth daily with breakfast.        . Iron Combinations (IRON COMPLEX PO) Take 1 tablet by mouth daily. Pt takes 65mg  iron       . levothyroxine (SYNTHROID, LEVOTHROID) 200 MCG tablet Take 100-200 mcg by mouth daily. Pt alternates between 1/2 to 1 tablet daily       . lipase/protease/amylase (CREON-10/PANCREASE) 12000 UNITS CPEP Take 1 capsule by mouth 3 (three) times daily before meals.        . Magnesium 250 MG TABS Take 1 tablet by mouth 4 (four) times daily.        . metFORMIN (GLUCOPHAGE) 500 MG tablet Take 500 mg by mouth 3 (three) times daily.        . metolazone (ZAROXOLYN) 5 MG tablet Take 5 mg by mouth every other day.        . Multiple Vitamins-Minerals (MULTIVITAMIN WITH MINERALS) tablet Take 1 tablet by mouth daily.        Marland Kitchen omeprazole (PRILOSEC) 40 MG capsule Take 40 mg by mouth 2 (two) times daily.        Marland Kitchen OVER THE COUNTER MEDICATION Take 1 tablet by mouth 4 (four) times daily. Calcium,magnesium,zinc  supplement       . potassium chloride SA (K-DUR,KLOR-CON) 20 MEQ tablet Take 60 mEq by mouth 4 (four) times daily. Pt takes 3 tabs       . Vitamin D, Ergocalciferol, (DRISDOL) 50000 UNITS CAPS Take 50,000 Units by mouth daily.         Medications Prior to Admission  Medication Dose Route Frequency Provider Last Rate Last Dose  . ceFAZolin (ANCEF) IVPB 1 g/50 mL premix  1 g Intravenous to XRAY Simonne Come      . DISCONTD: 0.9 %  sodium chloride infusion   Intravenous Continuous Anselm Pancoast, PA      . DISCONTD: ceFAZolin (ANCEF) 1 g in dextrose 5 % 50 mL IVPB  1 g Intravenous Once Anselm Pancoast, PA        Results for orders placed during the hospital encounter of 10/03/11 (from the past 48 hour(s))  CBC     Status: Normal   Collection Time   10/03/11  2:00 PM      Component Value Range Comment   WBC 6.8  4.0 - 10.5 (K/uL) WHITE COUNT CONFIRMED ON SMEAR   RBC  4.19  3.87 - 5.11 (MIL/uL)    Hemoglobin 12.2  12.0 - 15.0 (g/dL)    HCT 16.1  09.6 - 04.5 (%)    MCV 88.3  78.0 - 100.0 (fL)    MCH 29.1  26.0 - 34.0 (pg)    MCHC 33.0  30.0 - 36.0 (g/dL)    RDW 40.9  81.1 - 91.4 (%)    Platelets 235  150 - 400 (K/uL)   PROTIME-INR     Status: Normal   Collection Time   10/03/11  2:00 PM      Component Value Range Comment   Prothrombin Time 12.6  11.6 - 15.2 (seconds)    INR 0.92  0.00 - 1.49    GLUCOSE, CAPILLARY     Status: Abnormal   Collection Time   10/03/11  2:43 PM      Component Value Range Comment   Glucose-Capillary 161 (*) 70 - 99 (mg/dL)    No results found.  Review of Systems  Constitutional: Negative for fever and chills.  Respiratory: Negative for cough and shortness of breath.   Cardiovascular: Negative for chest pain.  Gastrointestinal: Negative for nausea, vomiting and abdominal pain.  Neurological: Negative for headaches.  Endo/Heme/Allergies: Does not bruise/bleed easily.    Blood pressure 115/68, pulse 60, temperature 98.3 F (36.8 C), height 5\' 7"   (1.702 m), weight 152 lb (68.947 kg), SpO2 97.00%. Physical Exam  Constitutional: She is oriented to person, place, and time. She appears well-developed and well-nourished.  Cardiovascular: Normal rate and regular rhythm.   Respiratory: Effort normal and breath sounds normal.  GI: Soft. Bowel sounds are normal. There is no tenderness.  Musculoskeletal: Normal range of motion.  Neurological: She is alert and oriented to person, place, and time.  Psychiatric: She has a normal mood and affect.     Assessment/Plan Patient with history of bile duct cancer,port a cath; s/p completion of therapy. Plan is for port a cath removal under conscious sedation.  Nesiah Jump,D KEVIN 10/03/2011, 3:35 PM

## 2011-10-03 NOTE — ED Notes (Signed)
Ancef one gram IV

## 2011-10-03 NOTE — Procedures (Signed)
Successful fluoroscopic guided removal of port-a-cath.  No immediate complications.

## 2011-10-29 ENCOUNTER — Telehealth: Payer: Self-pay | Admitting: *Deleted

## 2011-10-29 NOTE — Telephone Encounter (Signed)
patient called in and asked that all her appointments be cancelled

## 2011-10-30 ENCOUNTER — Other Ambulatory Visit: Payer: Self-pay | Admitting: Internal Medicine

## 2011-10-30 DIAGNOSIS — Z1231 Encounter for screening mammogram for malignant neoplasm of breast: Secondary | ICD-10-CM

## 2011-11-21 ENCOUNTER — Ambulatory Visit: Payer: BC Managed Care – PPO

## 2011-12-03 ENCOUNTER — Ambulatory Visit
Admission: RE | Admit: 2011-12-03 | Discharge: 2011-12-03 | Disposition: A | Discharge status: Home / Self Care | Payer: BC Managed Care – PPO | Source: Ambulatory Visit | Attending: Internal Medicine | Admitting: Internal Medicine

## 2011-12-03 DIAGNOSIS — Z1231 Encounter for screening mammogram for malignant neoplasm of breast: Secondary | ICD-10-CM

## 2011-12-09 ENCOUNTER — Other Ambulatory Visit: Payer: Self-pay | Admitting: Internal Medicine

## 2011-12-09 DIAGNOSIS — R928 Other abnormal and inconclusive findings on diagnostic imaging of breast: Secondary | ICD-10-CM

## 2011-12-16 ENCOUNTER — Ambulatory Visit
Admission: RE | Admit: 2011-12-16 | Discharge: 2011-12-16 | Disposition: A | Discharge status: Home / Self Care | Payer: BC Managed Care – PPO | Source: Ambulatory Visit | Attending: Internal Medicine | Admitting: Internal Medicine

## 2011-12-16 DIAGNOSIS — R928 Other abnormal and inconclusive findings on diagnostic imaging of breast: Secondary | ICD-10-CM

## 2011-12-16 LAB — HM MAMMOGRAPHY: HM Mammogram: NEGATIVE

## 2012-02-04 IMAGING — CR DG CHEST 2V
2 series · 2 of 2 positions shown · non-contrast
Comparison: chest radiograph 10/09/2009

CLINICAL DATA: Week in legs

CHEST - 2 VIEW

[w chest lat]
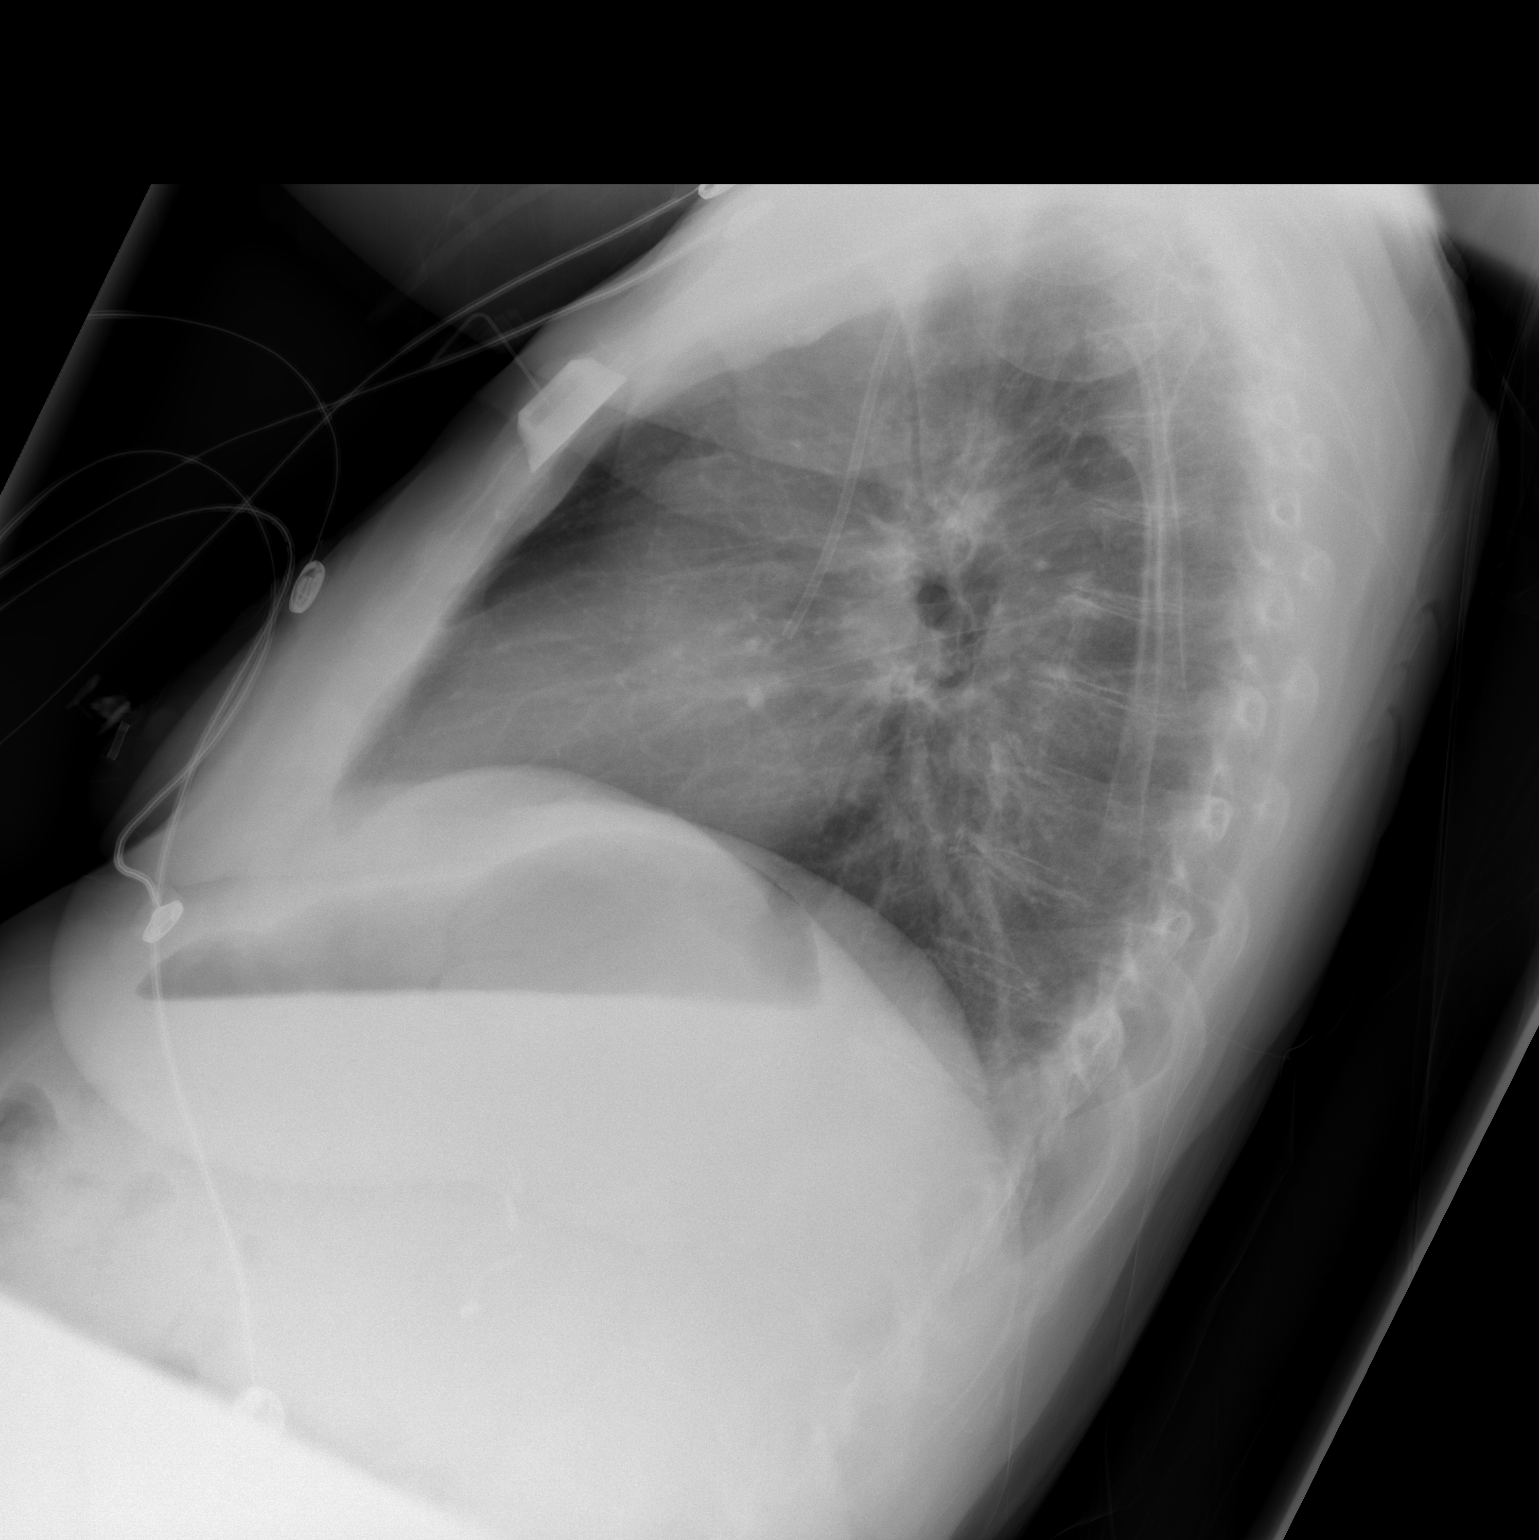

[view not recorded]
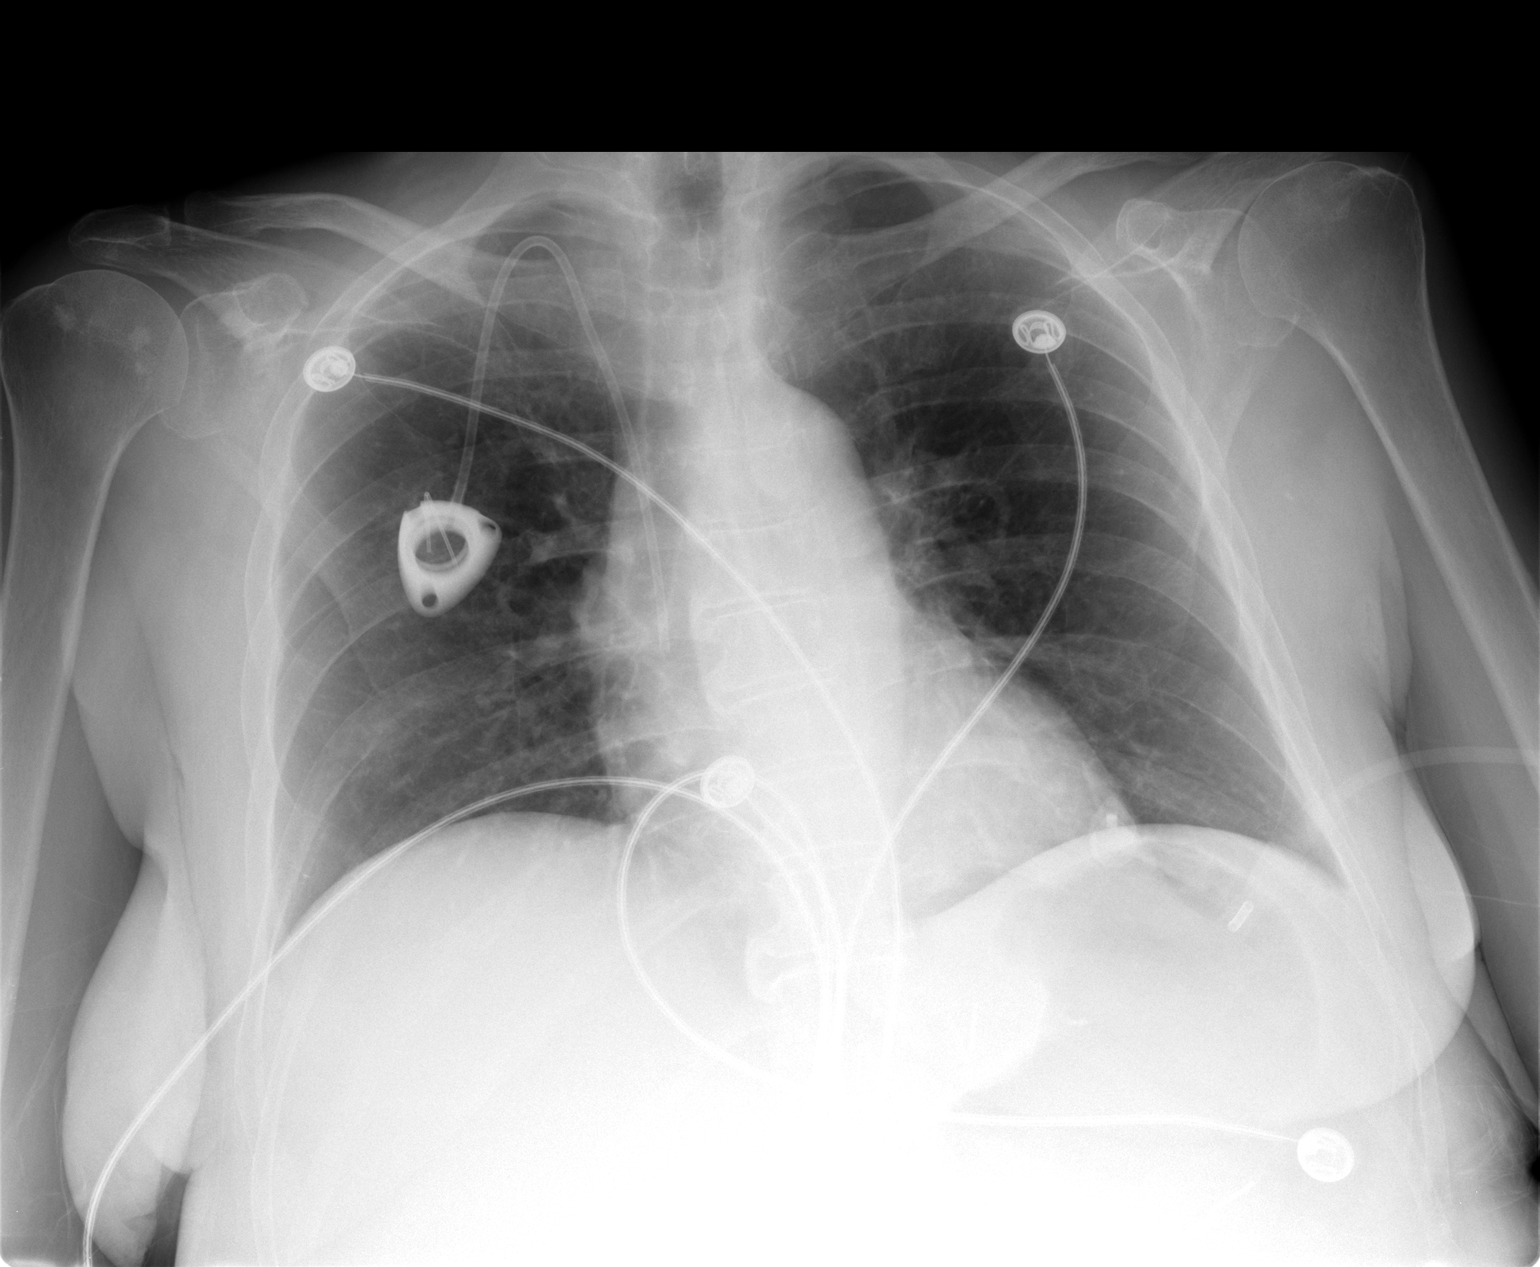

[2 of 2 positions shown; findings below may reference images not displayed]

FINDINGS: Port in right anterior chest wall is unchanged in
position.  No evidence effusion, infiltrate, or pneumothorax.
IMPRESSION: No acute cardiopulmonary process.

## 2012-02-17 ENCOUNTER — Other Ambulatory Visit: Payer: Self-pay | Admitting: Hematology and Oncology

## 2012-02-17 ENCOUNTER — Telehealth: Payer: Self-pay | Admitting: Hematology and Oncology

## 2012-02-17 DIAGNOSIS — C240 Malignant neoplasm of extrahepatic bile duct: Secondary | ICD-10-CM

## 2012-02-17 NOTE — Telephone Encounter (Signed)
lmonvm for pt re appts for 4/23 lb/ct and 4/26 f/u. Schedule mailed.

## 2012-02-21 ENCOUNTER — Telehealth: Payer: Self-pay | Admitting: Hematology and Oncology

## 2012-02-21 NOTE — Telephone Encounter (Signed)
Pt called this morning to r/s ct/fu visit. Per pt lab can stay on 4/23 but she needs to r/s ct and f/u. Pt given new f/u appt for 5/3 @ 11:30 am. Pt will call central scheduling to r/s ct and is aware ct needs to be after lb but before f/u.

## 2012-03-03 ENCOUNTER — Other Ambulatory Visit (HOSPITAL_COMMUNITY): Payer: Self-pay | Admitting: Internal Medicine

## 2012-03-03 DIAGNOSIS — Z780 Asymptomatic menopausal state: Secondary | ICD-10-CM

## 2012-03-05 ENCOUNTER — Ambulatory Visit (HOSPITAL_COMMUNITY)
Admission: RE | Admit: 2012-03-05 | Discharge: 2012-03-05 | Disposition: A | Discharge status: Home / Self Care | Payer: BC Managed Care – PPO | Source: Ambulatory Visit | Attending: Internal Medicine | Admitting: Internal Medicine

## 2012-03-05 DIAGNOSIS — Z780 Asymptomatic menopausal state: Secondary | ICD-10-CM

## 2012-03-05 DIAGNOSIS — Z13820 Encounter for screening for osteoporosis: Secondary | ICD-10-CM | POA: Insufficient documentation

## 2012-03-10 ENCOUNTER — Other Ambulatory Visit (HOSPITAL_COMMUNITY): Payer: BC Managed Care – PPO

## 2012-03-10 ENCOUNTER — Other Ambulatory Visit (HOSPITAL_BASED_OUTPATIENT_CLINIC_OR_DEPARTMENT_OTHER): Payer: BC Managed Care – PPO | Admitting: Lab

## 2012-03-10 DIAGNOSIS — C240 Malignant neoplasm of extrahepatic bile duct: Secondary | ICD-10-CM

## 2012-03-10 LAB — COMPREHENSIVE METABOLIC PANEL
Albumin: 4.5 g/dL (ref 3.5–5.2)
Alkaline Phosphatase: 92 U/L (ref 39–117)
BUN: 23 mg/dL (ref 6–23)
CO2: 32 mEq/L (ref 19–32)
Glucose, Bld: 331 mg/dL — ABNORMAL HIGH (ref 70–99)
Potassium: 2.9 mEq/L — ABNORMAL LOW (ref 3.5–5.3)
Sodium: 137 mEq/L (ref 135–145)
Total Protein: 7.3 g/dL (ref 6.0–8.3)

## 2012-03-10 LAB — CBC WITH DIFFERENTIAL/PLATELET
Basophils Absolute: 0 10*3/uL (ref 0.0–0.1)
EOS%: 1.1 % (ref 0.0–7.0)
Eosinophils Absolute: 0.1 10*3/uL (ref 0.0–0.5)
HCT: 41.5 % (ref 34.8–46.6)
HGB: 13.5 g/dL (ref 11.6–15.9)
MCH: 29.3 pg (ref 25.1–34.0)
MCV: 90.3 fL (ref 79.5–101.0)
MONO%: 9.4 % (ref 0.0–14.0)
NEUT#: 7.3 10*3/uL — ABNORMAL HIGH (ref 1.5–6.5)
NEUT%: 73.7 % (ref 38.4–76.8)

## 2012-03-10 LAB — CANCER ANTIGEN 19-9: CA 19-9: 41 U/mL — ABNORMAL HIGH (ref <35.0)

## 2012-03-10 LAB — CEA: CEA: 19.6 ng/mL — ABNORMAL HIGH (ref 0.0–5.0)

## 2012-03-11 ENCOUNTER — Encounter: Payer: Self-pay | Admitting: Medical Oncology

## 2012-03-11 NOTE — Progress Notes (Signed)
I called pt and left her a voice message regarding her low potassium. Labs were faxed to her primary Dr. Astrid Divine.

## 2012-03-13 ENCOUNTER — Ambulatory Visit: Payer: BC Managed Care – PPO | Admitting: Hematology and Oncology

## 2012-03-17 ENCOUNTER — Ambulatory Visit (HOSPITAL_COMMUNITY)
Admission: RE | Admit: 2012-03-17 | Discharge: 2012-03-17 | Disposition: A | Discharge status: Home / Self Care | Payer: BC Managed Care – PPO | Source: Ambulatory Visit | Attending: Hematology and Oncology | Admitting: Hematology and Oncology

## 2012-03-17 ENCOUNTER — Encounter (HOSPITAL_COMMUNITY): Payer: Self-pay

## 2012-03-17 DIAGNOSIS — N269 Renal sclerosis, unspecified: Secondary | ICD-10-CM | POA: Insufficient documentation

## 2012-03-17 DIAGNOSIS — J984 Other disorders of lung: Secondary | ICD-10-CM | POA: Insufficient documentation

## 2012-03-17 DIAGNOSIS — C240 Malignant neoplasm of extrahepatic bile duct: Secondary | ICD-10-CM | POA: Insufficient documentation

## 2012-03-17 HISTORY — DX: Malignant (primary) neoplasm, unspecified: C80.1

## 2012-03-17 MED ORDER — IOHEXOL 300 MG/ML  SOLN
100.0000 mL | Freq: Once | INTRAMUSCULAR | Status: AC | PRN
  Administered 2012-03-17: 100 mL via INTRAVENOUS

## 2012-03-20 ENCOUNTER — Encounter: Payer: Self-pay | Admitting: Hematology and Oncology

## 2012-03-20 ENCOUNTER — Telehealth: Payer: Self-pay | Admitting: Hematology and Oncology

## 2012-03-20 ENCOUNTER — Ambulatory Visit (HOSPITAL_BASED_OUTPATIENT_CLINIC_OR_DEPARTMENT_OTHER): Payer: BC Managed Care – PPO | Admitting: Hematology and Oncology

## 2012-03-20 VITALS — BP 141/86 | HR 55 | Temp 97.3°F | Ht 67.0 in | Wt 164.7 lb

## 2012-03-20 DIAGNOSIS — C259 Malignant neoplasm of pancreas, unspecified: Secondary | ICD-10-CM

## 2012-03-20 NOTE — Progress Notes (Signed)
This office note has been dictated.

## 2012-03-20 NOTE — Patient Instructions (Signed)
Courtney Stevens  [id number]  [facility] Discharge Instructions  RECOMMENDATIONS MADE BY THE CONSULTANT AND ANY TEST RESULTS WILL BE SENT TO YOUR REFERRING DOCTOR.   EXAM FINDINGS BY MD TODAY AND SIGNS AND SYMPTOMS TO REPORT TO CLINIC OR PRIMARY MD:   Your current list of medications are: Current Outpatient Prescriptions  Medication Sig Dispense Refill  . ALPRAZolam (XANAX) 1 MG tablet Take 1 mg by mouth at bedtime as needed. anxiety       . cetirizine (ZYRTEC) 10 MG tablet Take 10 mg by mouth every morning.        . glyBURIDE (DIABETA) 5 MG tablet Take 5 mg by mouth daily with breakfast.        . Iron Combinations (IRON COMPLEX PO) Take 1 tablet by mouth daily. Pt takes 65mg  iron       . levothyroxine (SYNTHROID, LEVOTHROID) 200 MCG tablet Take 100-200 mcg by mouth daily. Pt alternates between 1/2 to 1 tablet daily       . lipase/protease/amylase (CREON-10/PANCREASE) 12000 UNITS CPEP Take 1 capsule by mouth 3 (three) times daily before meals.        . Magnesium 250 MG TABS Take 1 tablet by mouth 4 (four) times daily.        . metFORMIN (GLUCOPHAGE) 500 MG tablet Take 500 mg by mouth 3 (three) times daily.        . Multiple Vitamins-Minerals (MULTIVITAMIN WITH MINERALS) tablet Take 1 tablet by mouth daily.        Marland Kitchen omeprazole (PRILOSEC) 40 MG capsule Take 40 mg by mouth 2 (two) times daily.        Marland Kitchen OVER THE COUNTER MEDICATION Take 1 tablet by mouth 4 (four) times daily. Calcium,magnesium,zinc supplement       . potassium chloride SA (K-DUR,KLOR-CON) 20 MEQ tablet Take 80 mEq by mouth 4 (four) times daily. Pt takes 3 tabs      . Vitamin D, Ergocalciferol, (DRISDOL) 50000 UNITS CAPS Take 50,000 Units by mouth daily.           INSTRUCTIONS GIVEN AND DISCUSSED:   SPECIAL INSTRUCTIONS/FOLLOW-UP:  See above.  I acknowledge that I have been informed and understand all the instructions given to me and received a copy. I do not have any more questions at this time, but understand  that I may call the [facility] at [phone] during business hours should I have any further questions or need assistance in obtaining follow-up care.

## 2012-03-20 NOTE — Progress Notes (Signed)
CC:   Lucky Cowboy, M.D. Barbette Hair. 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  INTERVAL HISTORY:  The patient continues to have no current concerns. Feels great.  Is working full time.  Has not lost any weight.  Has excellent energy levels.  Reviewed results of a recent CT scan of the chest, abdomen, and pelvis.  The chest showed multiple patchy nodular densities and metastatic disease was considered unlikely.  The patient reports to me that she has recently recovered from an upper respiratory tract infection.  The abdomen and pelvis showed postop changes compatible with a Whipple's.  Pneumobilia was shown.  There was less edema within the right upper quadrant.  There was no abdominal or pelvic adenopathy.  No evidence of recurrent disease. She received a colonoscopy  in 2012.  MEDICATIONS:  Reviewed and updated.  ALLERGIES:  Nedocromil and povidone.  Past medical history, family history and social history unchanged.  REVIEW OF SYSTEMS:  10-point review of systems was negative.  PHYSICAL EXAM:  Patient is alert and oriented x3.  Vitals:  Pulse 55, blood pressure 141/86, temperature 97.3, respirations 20, weight 164 pounds.  HEENT:  Head is atraumatic, normocephalic.  Sclerae anicteric. Mouth moist.  Neck:  Supple.  Chest:  Clear.  CVS:  Unremarkable. Abdomen:  Soft, nontender.  Bowel sounds present.  Extremities:  No edema.  Pulses present and symmetrical.  Lymph nodes:  No adenopathy. CNS:  Nonfocal.  LAB DATA:  On 03/07/2012, white cell count 9.9, hemoglobin 13.5, hematocrit 41.5, platelets 270.  Sodium 137, potassium 2.9, it has been corrected, chloride 87, CO2 32, BUN 23, creatinine 1.1, total  bilirubin 0.4, alkaline phosphatase 92, AST 17, ALT 15.  CEA 19.6 (17.1), CA 19-9 41 (24).  IMPRESSION AND PLAN:  Mrs. 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  She is status post radiation with continuous infusion 5-FU from 04/01/2008 through 05/24/2008.  She then received 3 cycles of gemcitabine from 05/19/2008 to 12/01/2008.  She was re-treated with gemcitabine from 02/17/2009 through 05/25/2009 due to increasing tumor markers.  She was switched to single-agent Taxotere and received 3 cycles from 06/15/2009 through 07/27/2009.  Mrs. Costley scans indicate no evidence of recurrence.  Her tumor markers remain slightly elevated, but stable.  She continues with surveillance every 6 months.  She follows up  in 6 months' time with labs and scans.   ______________________________ Laurice Record, M.D. LIO/MEDQ  D:  03/20/2012  T:  03/20/2012  Job:  [id number]

## 2012-03-20 NOTE — Telephone Encounter (Signed)
appts made and printed for pt aom °

## 2012-08-14 ENCOUNTER — Ambulatory Visit (HOSPITAL_COMMUNITY)
Admission: RE | Admit: 2012-08-14 | Discharge: 2012-08-14 | Disposition: A | Discharge status: Home / Self Care | Payer: BC Managed Care – PPO | Source: Ambulatory Visit | Attending: Hematology and Oncology | Admitting: Hematology and Oncology

## 2012-08-14 ENCOUNTER — Other Ambulatory Visit (HOSPITAL_BASED_OUTPATIENT_CLINIC_OR_DEPARTMENT_OTHER): Payer: BC Managed Care – PPO | Admitting: Lab

## 2012-08-14 DIAGNOSIS — C259 Malignant neoplasm of pancreas, unspecified: Secondary | ICD-10-CM | POA: Insufficient documentation

## 2012-08-14 DIAGNOSIS — K7689 Other specified diseases of liver: Secondary | ICD-10-CM | POA: Insufficient documentation

## 2012-08-14 LAB — COMPREHENSIVE METABOLIC PANEL (CC13)
ALT: 21 U/L (ref 0–55)
AST: 23 U/L (ref 5–34)
Creatinine: 1 mg/dL (ref 0.6–1.1)
Total Bilirubin: 0.4 mg/dL (ref 0.20–1.20)

## 2012-08-14 LAB — CEA: CEA: 12.8 ng/mL — ABNORMAL HIGH (ref 0.0–5.0)

## 2012-08-14 LAB — CBC WITH DIFFERENTIAL/PLATELET
BASO%: 0.2 % (ref 0.0–2.0)
EOS%: 2.7 % (ref 0.0–7.0)
HCT: 39.5 % (ref 34.8–46.6)
MCH: 29.4 pg (ref 25.1–34.0)
MCHC: 33.2 g/dL (ref 31.5–36.0)
MONO#: 0.6 10*3/uL (ref 0.1–0.9)
RBC: 4.46 10*6/uL (ref 3.70–5.45)
RDW: 14 % (ref 11.2–14.5)
WBC: 5.9 10*3/uL (ref 3.9–10.3)
lymph#: 1 10*3/uL (ref 0.9–3.3)

## 2012-08-14 LAB — CANCER ANTIGEN 19-9: CA 19-9: 30.4 U/mL (ref <35.0)

## 2012-08-14 MED ORDER — IOHEXOL 300 MG/ML  SOLN
100.0000 mL | Freq: Once | INTRAMUSCULAR | Status: AC | PRN
  Administered 2012-08-14: 100 mL via INTRAVENOUS

## 2012-08-19 ENCOUNTER — Telehealth: Payer: Self-pay | Admitting: Hematology and Oncology

## 2012-08-19 ENCOUNTER — Encounter: Payer: Self-pay | Admitting: Family

## 2012-08-19 ENCOUNTER — Ambulatory Visit (HOSPITAL_BASED_OUTPATIENT_CLINIC_OR_DEPARTMENT_OTHER): Payer: BC Managed Care – PPO | Admitting: Family

## 2012-08-19 VITALS — BP 126/80 | HR 60 | Temp 97.8°F | Resp 20 | Ht 67.0 in | Wt 163.1 lb

## 2012-08-19 DIAGNOSIS — C240 Malignant neoplasm of extrahepatic bile duct: Secondary | ICD-10-CM

## 2012-08-19 NOTE — Progress Notes (Signed)
Stevens ID: Courtney Stevens, female   DOB: 1956/03/28, 56 y.o.   MRN: [id number] CSN: [id number]  CC: Lucky Cowboy, M.D.  Barbette Hair. Arlyce Dice, MD,FACG  Identifying Statement: Courtney Stevens is a 56 y.o. Caucasian female with a history of adenocarcinoma of Courtney bile duct who presents for follow-up.   Interval History: Courtney Stevens is a 56 year-old female with a history of adenocarcinoma of Courtney bile duct s/p radical resection.  She was last seen in our office on 03/20/2012 by Dr. Dalene Carrow.  Since that time, Courtney Stevens states that she is doing very well and she continues to have no current concerns. Courtney Stevens denies any symptomatolgy including no fever, chills, N/V/D or constipation, night sweats, unusual bleeding or pain.  Courtney Stevens continues to be active and works full-time as a Chiropodist at [employer].  Courtney Stevens had CT scans of Courtney abdomen/pelvis and chest with contrast on 08/14/2012 both of which did not show any evidence for metastatic disease, residual or recurrence of tumor.   Medications: Current Outpatient Prescriptions  Medication Sig Dispense Refill  . ALPRAZolam (XANAX) 1 MG tablet Take 1 mg by mouth at bedtime as needed. anxiety       . calcium citrate-vitamin D (CITRACAL+D) 315-200 MG-UNIT per tablet Take 1 tablet by mouth 3 (three) times daily.      . cetirizine (ZYRTEC) 10 MG tablet Take 10 mg by mouth every morning.        . glyBURIDE (DIABETA) 5 MG tablet Take 5 mg by mouth daily with breakfast.        . Iron Combinations (IRON COMPLEX PO) Take 1 tablet by mouth daily. Pt takes 65mg  iron       . levothyroxine (SYNTHROID, LEVOTHROID) 200 MCG tablet Take 200 mcg by mouth daily. Pt alternates between 1/2 to 1 tablet daily      . lipase/protease/amylase (CREON-10/PANCREASE) 12000 UNITS CPEP Take 1 capsule by mouth 3 (three) times daily before meals.        . Magnesium 250 MG TABS Take 1 tablet by mouth 4 (four) times daily.        . metFORMIN (GLUCOPHAGE) 500 MG tablet Take 500  mg by mouth 3 (three) times daily with meals. One tab at breakfast, one tab at lunch and two tabs for dinner      . Multiple Vitamins-Minerals (MULTIVITAMIN WITH MINERALS) tablet Take 1 tablet by mouth daily.        Marland Kitchen omeprazole (PRILOSEC) 40 MG capsule Take 40 mg by mouth 2 (two) times daily.        Marland Kitchen OVER Courtney COUNTER MEDICATION Take 1 tablet by mouth 4 (four) times daily. Calcium,magnesium,zinc supplement       . potassium chloride SA (K-DUR,KLOR-CON) 20 MEQ tablet Take 20 mEq by mouth 2 (two) times daily.       . Vitamin D, Ergocalciferol, (DRISDOL) 50000 UNITS CAPS Take 50,000 Units by mouth daily.          Allergies: Allergies  Allergen Reactions  . Tilade (Nedocromil) Anaphylaxis  . Betadine (Povidone Iodine)     Past Medical History: Past Medical History  Diagnosis Date  . Cancer     bile duct ca  . GERD (gastroesophageal reflux disease)   . Diabetes mellitus   . Hypertension   . Hypothyroid      Family History: Family History  Problem Relation Age of Onset  . Cancer Mother   . Hypertension Mother   . Glaucoma Mother   .  Diabetes Father   . Alzheimer's disease Father   . Heart attack Father   . Nephrolithiasis Father   . Nephrolithiasis Sister   . Heart attack Brother   . Nephrolithiasis Brother     Social History: History  Substance Use Topics  . Smoking status: Former Smoker -- 2.0 packs/day for 5 years    Types: Cigarettes  . Smokeless tobacco: Never Used  . Alcohol Use: Yes     rarely    Review of Systems: 10 point review of systems was completed and is negative except as noted above.   Physical Exam: Blood pressure 126/80, pulse 60, temperature 97.8 F (36.6 C), temperature source Oral, resp. rate 20, height 5\' 7"  (1.702 m), weight 163 lb 1.6 oz (73.982 kg).  General appearance: Alert, cooperative, well nourished, no apparent distress Head: Normocephalic, without obvious abnormality, atraumatic Eyes: Conjunctivae/corneas clear, PERRLA,  EOMI Nose: Nares, septum and mucosa are normal, no drainage or sinus tenderness Neck: No adenopathy, supple, symmetrical, trachea midline, thyroid not enlarged, no tenderness Resp: Clear to auscultation bilaterally Cardio: Regular rate and rhythm, S1, S2 normal, no murmur, click, rub or gallop GI: Soft, non-tender, distended, hypoactive bowel sounds, no organomegaly Extremities: Extremities normal, atraumatic, no cyanosis or edema Lymph nodes: Cervical, supraclavicular, and axillary nodes normal Neurologic: Grossly normal   Laboratory Data: Sodium 138, potassium 3.5, chloride 98, CO2 28, BUN 10.0, creatinine 1.0, calcium 9.8, glucose 167, alkaline phosphatase 108, albumin 3.3, AST 23, ALT 21, total protein 6.7, total bilirubin 0.40, RBCs 5.9, RBCs 4.46, hemoglobin 13.1, hematocrit 39.9, MCV 88.6, platelets 216, ANC 4.2, CA 19-9  30.4, CEA 12.8.   Imaging: Ct Chest W Contrast 08/14/2012  *RADIOLOGY REPORT*  Clinical Data:  History of pancreas cancer  CT CHEST   Technique:  Multidetector CT imaging of Courtney chest, abdomen and pelvis was performed following Courtney standard protocol during bolus administration of intravenous contrast.  Contrast: OMNIPAQUE IOHEXOL 300 MG/ML  SOLN  Comparison:  03/17/2012  CT CHEST  Findings:  No axillary or supraclavicular adenopathy.  There is no mediastinal or hilar adenopathy.  No pericardial or pleural effusion identified.  Resolution of patchy ground-glass densities within Courtney right lung. No suspicious pulmonary nodule or mass identified.  Review of Courtney visualized bony structures is negative for aggressive lytic or sclerotic bone lesion.  IMPRESSION: No evidence for metastatic disease.  No acute findings.    CT ABDOMEN AND PELVIS  Findings:  There is diffuse fatty infiltration of Courtney liver.  No focal liver abnormalities.  Prior cholecystectomy.  Postoperative change compatible with Whipple procedure.  No evidence for residual or recurrent local tumor.  There is  pneumobilia compatible with biliary patency.    Pancreatic tail appears normal.    Courtney spleen appears normal.  Both adrenal glands are unremarkable.  Right renal cortical scarring is noted.  Left kidney is normal.  Urinary bladder is normal.  Previous hysterectomy.  Index gastrohepatic ligament lymph node is stable from previous exam measuring 6.1 mm.  Lymph node at Courtney aortic bifurcation measures 5 mm, image 86.  Stable from previous exam.  No new or enlarging abdominal or pelvic lymph nodes identified.  Previously noted nodule adjacent to Courtney inferior right hepatic lobe is stable measuring 7.8 mm, image 64.  Courtney stomach appears normal.  Again noted is edema and soft tissue haziness surrounding Courtney proximal duodenum.  This is stable from previous exam.  Normal appearance of Courtney colon.  Review of Courtney visualized osseous structures is unremarkable.  IMPRESSION:  1.  Stable CT of Courtney upper abdomen status post Whipple procedure. 2.  No specific features identified to suggest residual or recurrence of tumor.   Original Report Authenticated By: Rosealee Albee, M.D.     Impression/Plan: Courtney Stevens is a 56 year old woman who is status post radical resection for node-positive adenocarcinoma of Courtney bile duct with positive lymph node involvement (2 out of 17 lymph nodes) with positive surgical margins. She is status post radiation with continuous infusion 5-FU from 04/01/2008 through 05/24/2008. She then received 3 cycles of gemcitabine from 05/19/2008 to 12/01/2008. She was re-treated with gemcitabine from 02/17/2009 through 05/25/2009 due to increasing tumor markers. She was switched to single-agent Taxotere and received 3 cycles from 06/15/2009 through 07/27/2009.   Courtney Stevens scans do not indicate evidence of recurrence. Her tumor markers in general remain stable.  We will see Courtney Stevens again in 6 months time for an office visit with Dr. Dalene Carrow.  She will complete a CT scan of Courtney abdomen/pelvis and chest with  contrast in addition to laboratories prior to her next office visit.  Courtney Stevens will contact us in Courtney interim if she has any questions or concerns.     Larina Bras, NP-C 08/19/2012, 12:14 PM

## 2012-08-19 NOTE — Telephone Encounter (Signed)
gv pt appt schedule for April 2014.  °

## 2012-08-19 NOTE — Patient Instructions (Addendum)
Keep up the great work.  Please contact us if you have any questions.

## 2012-11-16 ENCOUNTER — Other Ambulatory Visit: Payer: Self-pay | Admitting: Internal Medicine

## 2012-11-16 DIAGNOSIS — Z1231 Encounter for screening mammogram for malignant neoplasm of breast: Secondary | ICD-10-CM

## 2012-12-09 ENCOUNTER — Ambulatory Visit: Payer: BC Managed Care – PPO

## 2012-12-19 ENCOUNTER — Telehealth: Payer: Self-pay | Admitting: Oncology

## 2012-12-19 ENCOUNTER — Encounter: Payer: Self-pay | Admitting: Oncology

## 2012-12-27 ENCOUNTER — Encounter: Payer: Self-pay | Admitting: Oncology

## 2012-12-27 DIAGNOSIS — C240 Malignant neoplasm of extrahepatic bile duct: Secondary | ICD-10-CM

## 2012-12-27 NOTE — Progress Notes (Signed)
We received lab results from Dr. Marinus Maw dated 12/17/2012. CA 19-9 was 120.7. The patient also tested positive for hepatitis B core antibody and hepatitis B surface antibody.  The patient was last seen here on 08/19/2012. She had CT scans of chest abdomen and pelvis on 08/14/2012. These were negative.  On 08/14/2012, CEA was 12.8 and CA 19-9 was 30.4  Patient has an appointment to see me on 02/25/2013. We will need to repeat tumor markers within the next week or so.

## 2012-12-28 ENCOUNTER — Encounter: Payer: Self-pay | Admitting: Medical Oncology

## 2012-12-31 ENCOUNTER — Ambulatory Visit: Payer: BC Managed Care – PPO

## 2012-12-31 ENCOUNTER — Telehealth: Payer: Self-pay | Admitting: Oncology

## 2012-12-31 NOTE — Telephone Encounter (Signed)
S/w pt re appt for 2/24.

## 2013-01-11 ENCOUNTER — Other Ambulatory Visit: Payer: BC Managed Care – PPO

## 2013-01-11 DIAGNOSIS — C240 Malignant neoplasm of extrahepatic bile duct: Secondary | ICD-10-CM

## 2013-01-11 LAB — CANCER ANTIGEN 19-9: CA 19-9: 83.7 U/mL — ABNORMAL HIGH (ref <35.0)

## 2013-01-11 LAB — CEA: CEA: 10.7 ng/mL — ABNORMAL HIGH (ref 0.0–5.0)

## 2013-01-21 ENCOUNTER — Ambulatory Visit: Payer: BC Managed Care – PPO

## 2013-02-19 ENCOUNTER — Other Ambulatory Visit (HOSPITAL_BASED_OUTPATIENT_CLINIC_OR_DEPARTMENT_OTHER): Payer: BC Managed Care – PPO | Admitting: Lab

## 2013-02-19 ENCOUNTER — Encounter (HOSPITAL_COMMUNITY): Payer: Self-pay

## 2013-02-19 ENCOUNTER — Ambulatory Visit (HOSPITAL_COMMUNITY)
Admission: RE | Admit: 2013-02-19 | Discharge: 2013-02-19 | Disposition: A | Discharge status: Home / Self Care | Payer: BC Managed Care – PPO | Source: Ambulatory Visit | Attending: Family | Admitting: Family

## 2013-02-19 ENCOUNTER — Other Ambulatory Visit: Payer: Self-pay | Admitting: Medical Oncology

## 2013-02-19 ENCOUNTER — Encounter: Payer: Self-pay | Admitting: Oncology

## 2013-02-19 DIAGNOSIS — Z9221 Personal history of antineoplastic chemotherapy: Secondary | ICD-10-CM | POA: Insufficient documentation

## 2013-02-19 DIAGNOSIS — C240 Malignant neoplasm of extrahepatic bile duct: Secondary | ICD-10-CM

## 2013-02-19 DIAGNOSIS — Q638 Other specified congenital malformations of kidney: Secondary | ICD-10-CM | POA: Insufficient documentation

## 2013-02-19 DIAGNOSIS — Z8509 Personal history of malignant neoplasm of other digestive organs: Secondary | ICD-10-CM | POA: Insufficient documentation

## 2013-02-19 DIAGNOSIS — Z90710 Acquired absence of both cervix and uterus: Secondary | ICD-10-CM | POA: Insufficient documentation

## 2013-02-19 DIAGNOSIS — C259 Malignant neoplasm of pancreas, unspecified: Secondary | ICD-10-CM

## 2013-02-19 DIAGNOSIS — Z923 Personal history of irradiation: Secondary | ICD-10-CM | POA: Insufficient documentation

## 2013-02-19 DIAGNOSIS — R918 Other nonspecific abnormal finding of lung field: Secondary | ICD-10-CM | POA: Insufficient documentation

## 2013-02-19 DIAGNOSIS — I2510 Atherosclerotic heart disease of native coronary artery without angina pectoris: Secondary | ICD-10-CM | POA: Insufficient documentation

## 2013-02-19 DIAGNOSIS — I700 Atherosclerosis of aorta: Secondary | ICD-10-CM | POA: Insufficient documentation

## 2013-02-19 DIAGNOSIS — K5730 Diverticulosis of large intestine without perforation or abscess without bleeding: Secondary | ICD-10-CM | POA: Insufficient documentation

## 2013-02-19 LAB — COMPREHENSIVE METABOLIC PANEL (CC13)
ALT: 19 U/L (ref 0–55)
AST: 31 U/L (ref 5–34)
Chloride: 104 mEq/L (ref 98–107)
Creatinine: 0.9 mg/dL (ref 0.6–1.1)
Sodium: 141 mEq/L (ref 136–145)
Total Bilirubin: 0.28 mg/dL (ref 0.20–1.20)

## 2013-02-19 MED ORDER — IOHEXOL 300 MG/ML  SOLN
100.0000 mL | Freq: Once | INTRAMUSCULAR | Status: AC | PRN
  Administered 2013-02-19: 100 mL via INTRAVENOUS

## 2013-02-19 NOTE — Progress Notes (Signed)
This patient apparently was diagnosed with an adenocarcinoma of the bile duct in late March 2009 and underwent surgery during the month of April with 2 out of 17 positive lymph nodes and a positive surgical margin. I am unable to find a pathology report in our records regarding the surgery and therefore I believe that surgery was probably carried out at one of our tertiary centers. She underwent radiation therapy along with continuous infusion 5-fluorouracil from 04/01/2008 through 05/24/2008. She then received 3 cycles of gemcitabine from 05/19/2008 through 12/01/2008. Apparently tumor markers increased and the patient was retreated with gemcitabine from 02/17/2009 through 05/25/2009. She then received Taxotere for 3 cycles from 06/15/2009 through 07/27/2009.  This patient had CT scans of chest, abdomen and pelvis with IV contrast carried out on 02/19/2013. I have reviewed these scans with a radiologist. There are tiny 1-3 mm nodules throughout both lung fields which could be very early metastases or perhaps inflammatory. These lesions were not present on the prior CT scans from 08/14/2012.  It is recommended that we carry out a chest CT scan without IV contrast in 2-3 months.

## 2013-02-24 ENCOUNTER — Other Ambulatory Visit: Payer: Self-pay | Admitting: Oncology

## 2013-02-24 ENCOUNTER — Ambulatory Visit: Payer: BC Managed Care – PPO | Admitting: Hematology and Oncology

## 2013-02-24 DIAGNOSIS — C240 Malignant neoplasm of extrahepatic bile duct: Secondary | ICD-10-CM

## 2013-02-25 ENCOUNTER — Other Ambulatory Visit (HOSPITAL_BASED_OUTPATIENT_CLINIC_OR_DEPARTMENT_OTHER): Payer: BC Managed Care – PPO | Admitting: Lab

## 2013-02-25 ENCOUNTER — Ambulatory Visit (HOSPITAL_BASED_OUTPATIENT_CLINIC_OR_DEPARTMENT_OTHER): Payer: BC Managed Care – PPO | Admitting: Oncology

## 2013-02-25 ENCOUNTER — Encounter: Payer: Self-pay | Admitting: Oncology

## 2013-02-25 VITALS — BP 130/67 | HR 65 | Temp 98.3°F | Resp 18 | Ht 67.0 in | Wt 162.2 lb

## 2013-02-25 DIAGNOSIS — Z8509 Personal history of malignant neoplasm of other digestive organs: Secondary | ICD-10-CM

## 2013-02-25 DIAGNOSIS — C259 Malignant neoplasm of pancreas, unspecified: Secondary | ICD-10-CM

## 2013-02-25 DIAGNOSIS — C240 Malignant neoplasm of extrahepatic bile duct: Secondary | ICD-10-CM

## 2013-02-25 DIAGNOSIS — R911 Solitary pulmonary nodule: Secondary | ICD-10-CM

## 2013-02-25 LAB — CBC WITH DIFFERENTIAL/PLATELET
BASO%: 0.5 % (ref 0.0–2.0)
Basophils Absolute: 0 10*3/uL (ref 0.0–0.1)
HCT: 40.1 % (ref 34.8–46.6)
HGB: 13 g/dL (ref 11.6–15.9)
MONO#: 0.6 10*3/uL (ref 0.1–0.9)
NEUT#: 4.2 10*3/uL (ref 1.5–6.5)
NEUT%: 67.8 % (ref 38.4–76.8)
WBC: 6.2 10*3/uL (ref 3.9–10.3)
lymph#: 1.2 10*3/uL (ref 0.9–3.3)

## 2013-02-25 LAB — COMPREHENSIVE METABOLIC PANEL (CC13)
ALT: 24 U/L (ref 0–55)
AST: 39 U/L — ABNORMAL HIGH (ref 5–34)
CO2: 29 mEq/L (ref 22–29)
Chloride: 101 mEq/L (ref 98–107)
Sodium: 140 mEq/L (ref 136–145)
Total Bilirubin: 0.35 mg/dL (ref 0.20–1.20)
Total Protein: 7 g/dL (ref 6.4–8.3)

## 2013-02-25 LAB — LACTATE DEHYDROGENASE (CC13): LDH: 174 U/L (ref 125–245)

## 2013-02-25 NOTE — Progress Notes (Signed)
This office note has been dictated.  #[id number]

## 2013-02-26 ENCOUNTER — Telehealth: Payer: Self-pay

## 2013-02-26 ENCOUNTER — Telehealth: Payer: Self-pay | Admitting: *Deleted

## 2013-02-26 ENCOUNTER — Telehealth: Payer: Self-pay | Admitting: Gastroenterology

## 2013-02-26 NOTE — Progress Notes (Signed)
CC:   Lucky Cowboy, M.D. Barbette Hair. Arlyce Dice, MD,FACG  PROBLEM LIST: 1. Adenocarcinoma arising in the distal common bile duct, well     differentiated, diagnosed in late March 2009.  The patient     underwent pancreaticoduodenectomy by Dr. Rise Mu at [facility] on 02/22/2008.  There was     extensive involvement of peri-ductal soft tissue and extension to     the duodenal wall and pancreas.  Lymphovascular and perineural     invasion was present.  The posterior soft tissue margin was     involved with tumor.  Four out of 18 regional lymph nodes were     involved by tumor.  Tumor stage was pT4 pN1 pM0.  Following     surgery, the patient underwent radiation treatments in conjunction     with continuous infusion 5 fluorouracil here in [place] from     04/01/2008 through 05/19/2008.  She then received gemcitabine from     05/19/2008 through 12/02/2007.  Because of a marked increase     in CA19-9, the patient was felt to have a recurrence.  This was not     confirmed histologically and I believe imaging studies were also     non-definitive.  The patient received gemcitabine from 02/17/2009     through 05/25/2009.  She then received Taxotere for 3 cycles from     06/15/2009 through 07/27/2009.  The patient remains clinically     disease-free on no treatment. 2. Elevated tumor markers.  CEA and CA19-9 have been elevated with     fluctuating levels. 3. Bilateral pulmonary nodules seen on  CT scans from 02/19/2013. 4. History of malabsorption detected in early 2011, currently under     successful treatment. 5. 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 6. Diabetes mellitus. 7. Hypertension. 8. Hypothyroidism diagnosed April 2011. 9. History of gastric bypass for obesity, Roux-en-Y, in 2004. 10.Diverticulosis. 11.Systolic ejection murmur.  MEDICATIONS:  Reviewed and recorded. Current Outpatient Prescriptions   Medication Sig Dispense Refill  . ALPRAZolam (XANAX) 1 MG tablet Take 1 mg by mouth at bedtime as needed. anxiety       . calcium citrate-vitamin D (CITRACAL+D) 315-200 MG-UNIT per tablet Take 1 tablet by mouth 3 (three) times daily.      . cetirizine (ZYRTEC) 10 MG tablet Take 10 mg by mouth every morning.        . furosemide (LASIX) 80 MG tablet       . glyBURIDE (DIABETA) 5 MG tablet Take 5 mg by mouth daily with breakfast.        . Iron Combinations (IRON COMPLEX PO) Take 1 tablet by mouth daily. Pt takes 65mg  iron       . levothyroxine (SYNTHROID, LEVOTHROID) 200 MCG tablet Take 200 mcg by mouth daily. Pt alternates between 1/2 to 1 tablet daily      . lipase/protease/amylase (CREON-10/PANCREASE) 12000 UNITS CPEP Take 1 capsule by mouth 3 (three) times daily before meals.        . Magnesium 250 MG TABS Take 1 tablet by mouth 4 (four) times daily.        . metFORMIN (GLUCOPHAGE) 500 MG tablet Take 500 mg by mouth 3 (three) times daily with meals. One tab at breakfast, one tab at lunch and two tabs for dinner      . Multiple Vitamins-Minerals (MULTIVITAMIN WITH MINERALS) tablet Take 1 tablet by  mouth daily.        Marland Kitchen omeprazole (PRILOSEC) 40 MG capsule Take 40 mg by mouth 2 (two) times daily.        Marland Kitchen OVER THE COUNTER MEDICATION Take 1 tablet by mouth 4 (four) times daily. Calcium,magnesium,zinc supplement       . potassium chloride SA (K-DUR,KLOR-CON) 20 MEQ tablet Take 20 mEq by mouth 2 (two) times daily.       . Vitamin D, Ergocalciferol, (DRISDOL) 50000 UNITS CAPS Take 50,000 Units by mouth daily.         No current facility-administered medications for this visit.   SMOKING HISTORY:  Patient smoked 2 to 2-1/2 packs of cigarettes a day for 4 years.  She stopped smoking in 1979.   HISTORY:  I am seeing Courtney Stevens, a 57 year old white married female, for the first time.  This patient's history was reviewed in detail.  As noted above, her history goes back to 2009.  I was able to find  the pathology report.  The patient has had a rather extraordinary course and at this point, appears to be disease-free.  She is without any major complaints today.  She occasionally has some brief fleeting epigastric discomfort lasting usually less than a minute.  If she is still and quiet, the pain will abate.  The patient also notes some discomfort if she eats too fast or too much.  Occasionally she will have some vomiting.  She denies any major changes in her weight, although there have been some fluctuations.  She denies any evidence of blood in her stool or melena.  In general, she feels well.  She lives with her husband here in [place].  She is a Geophysical data processor for [employer].  In general, the patient is feeling quite well.  PHYSICAL EXAM:  General:  She certainly looks well.  Weight is 162 pounds 3.2 ounces, height 5 feet 7 inches, body surface area. 1.86 sq m. Vital Signs:  Blood pressure 130/67.  Other vital signs are normal. HEENT:  There is no scleral icterus.  Mouth and pharynx are benign.  No peripheral adenopathy palpable.  Lungs:  Clear to percussion and auscultation.  Cardiac:  Regular rhythm with systolic ejection murmur. Lymph nodes:  No axillary or inguinal adenopathy.  Back:  No skeletal tenderness or deformity.  Abdomen:  Post-surgical with well-healed incisions.  Abdomen:  Soft, nontender, with no organomegaly or masses palpable.  Extremities:  Puffy ankles, left greater than right. Neurologic:  Exam is normal.  LABORATORY DATA:  Today, white count 6.2, ANC 4.2, hemoglobin 13.0, hematocrit 40.1, platelets 259,000.  Chemistries from 02/19/2013 were notable for an albumin of 3.0 and a glucose of 106, otherwise normal. CA19-9 today was 28.3 as compared with 83.7 on 01/11/2013.  CEA was 14.3 as compared with CEA of 10.7 on 01/11/2013.  The patient, for the most part, has had elevated tumor markers that have fluctuated rather widely for no  apparent reason.  IMAGING STUDIES:   1. CT scan of chest, abdomen, and pelvis with IV contrast on 08/14/2012 showed no evidence for metastatic disease.  There was evidence of a Whipple procedure, again without evidence for recurrence of tumor. 2. CT scan of chest, abdomen, and pelvis with IV contrast on 02/19/2013 showed interval development of innumerable randomly distributed pulmonary nodules throughout the lungs bilaterally.  Findings were nonspecific; however, metastatic disease to the lungs could not be excluded.  It was suggested the patient have a repeat study  in the next 2-3 months.  In addition, she was found to have atherosclerosis including 3-vessel coronary artery disease.  There was no evidence for local recurrence of disease in the abdomen or pelvis.  Postoperative changes were noted.  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  There was colonic diverticulosis.   IMPRESSION AND PLAN:  Clinically, this patient is doing well.  She remains without evidence for recurrent disease now 5 years from the time of diagnosis.  As noted above, she does have some pulmonary nodules that were new.  We are planning to repeat the CT scan of the chest without IV contrast in late May.  It would seem to me unlikely that these nodules are indicative of recurrent cancer from her common bile duct. Hopefully, these are simply inflammatory and will have disappeared.  I called the patient's attention to the calcification of her coronary arteries, also the positive hepatitis B serology.  I think these may require some additional followup.  The patient will discuss these findings with her primary physician, Dr. Oneta Rack.  As stated, we will go ahead with CT scan of the chest without IV contrast to be carried out in late May at the patient's suggestion.  We will plan to see the patient again in  6 months, around October 10th, at which time we will check CBC, chemistries, including CEA and CA19-9.  At this point, I hope we can try to cut down on additional scanning.  Extensive records were reviewed in preparation for this appointment.    ______________________________ Samul Dada, M.D. DSM/MEDQ  D:  02/25/2013  T:  02/26/2013  Job:  [id number]

## 2013-02-26 NOTE — Telephone Encounter (Signed)
Pt states she is having blood in her stool, requesting to be seen by Dr. Arlyce Dice. Pt scheduled to see Dr. Arlyce Dice 03/03/13@3 :45pm. Pt aware of appt date and time.

## 2013-02-26 NOTE — Telephone Encounter (Signed)
lvm that pt needs to call to verify if she is taking her K-dur 20 meq BID. Her potassium is low at 3.2 and we will need to adjust dose.

## 2013-02-26 NOTE — Telephone Encounter (Signed)
sw pt gv appt for 08/31/13 10:30 lab and 11am ov. Pt is aware...td

## 2013-02-26 NOTE — Telephone Encounter (Signed)
lvm that I am faxing labs to Dr Oneta Rack per Dr Arline Asp and it is important that pt does not miss doses. Labs faxed.

## 2013-02-26 NOTE — Telephone Encounter (Signed)
Patient is having blood in her stools.  Offered her appt with Dr. Arlyce Dice but his first one was not until May.

## 2013-03-03 ENCOUNTER — Encounter: Payer: Self-pay | Admitting: Internal Medicine

## 2013-03-03 ENCOUNTER — Encounter: Payer: Self-pay | Admitting: Gastroenterology

## 2013-03-03 ENCOUNTER — Ambulatory Visit (INDEPENDENT_AMBULATORY_CARE_PROVIDER_SITE_OTHER): Payer: BC Managed Care – PPO | Admitting: Internal Medicine

## 2013-03-03 ENCOUNTER — Ambulatory Visit (INDEPENDENT_AMBULATORY_CARE_PROVIDER_SITE_OTHER): Payer: BC Managed Care – PPO | Admitting: Gastroenterology

## 2013-03-03 VITALS — BP 120/60 | HR 66 | Ht 67.0 in | Wt 161.0 lb

## 2013-03-03 VITALS — BP 124/68 | HR 70 | Temp 98.1°F | Ht 67.0 in | Wt 161.4 lb

## 2013-03-03 DIAGNOSIS — R059 Cough, unspecified: Secondary | ICD-10-CM

## 2013-03-03 DIAGNOSIS — Z800 Family history of malignant neoplasm of digestive organs: Secondary | ICD-10-CM

## 2013-03-03 DIAGNOSIS — K625 Hemorrhage of anus and rectum: Secondary | ICD-10-CM

## 2013-03-03 DIAGNOSIS — C240 Malignant neoplasm of extrahepatic bile duct: Secondary | ICD-10-CM

## 2013-03-03 DIAGNOSIS — Z860100 Personal history of colon polyps, unspecified: Secondary | ICD-10-CM

## 2013-03-03 DIAGNOSIS — R05 Cough: Secondary | ICD-10-CM

## 2013-03-03 DIAGNOSIS — K921 Melena: Secondary | ICD-10-CM

## 2013-03-03 DIAGNOSIS — R918 Other nonspecific abnormal finding of lung field: Secondary | ICD-10-CM

## 2013-03-03 DIAGNOSIS — K8689 Other specified diseases of pancreas: Secondary | ICD-10-CM

## 2013-03-03 MED ORDER — AMOXICILLIN-POT CLAVULANATE 875-125 MG PO TABS: 1.0000 | ORAL_TABLET | Freq: Two times a day (BID) | ORAL | Status: DC

## 2013-03-03 MED ORDER — PREDNISONE (PAK) 10 MG PO TABS: ORAL_TABLET | ORAL | Status: DC

## 2013-03-03 NOTE — Assessment & Plan Note (Signed)
Patient has had limited rectal bleeding that may be hemorrhoidal. A more proximal colonic bleeding source should be ruled out, especially in view of her family history and history of colon polyps.  Recommendations #1 colonoscopy

## 2013-03-03 NOTE — Assessment & Plan Note (Signed)
Patient has pancreatic insufficiency following surgery and is doing well on enzyme replacement

## 2013-03-03 NOTE — Progress Notes (Signed)
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 For oncologic details see Dr. Mamie Levers  note from April 10. The patient has had intermittent minimal bleeding consisting of  blood coating the stools and on the tissue. She denies change of bowel habits, abdominal or rectal pain.  She has a history of colon polyps. Last examined 2011 demonstrated a 3 mm adenomas polyp in the sigmoid which was removed. Diverticulosis in the sigmoid was also seen. Family history is pertinent for mother with colon cancer   Past Medical History  Diagnosis Date  . Cancer     bile duct ca  . GERD (gastroesophageal reflux disease)   . Diabetes mellitus   . Hypertension   . Hypothyroid    Past Surgical History  Procedure Laterality Date  . Gastric bypass  2004  . Cholecystectomy  1992  . Abdominal hysterectomy  1990  . Bile duct resection  2009   family history includes Alzheimer's disease in her father; Colon cancer in her mother; Diabetes in her father; Emphysema in her father; Glaucoma in her mother; Heart attack in her brother and father; Heart disease in her father; Hypertension in her mother; and Nephrolithiasis in her brother, father, and sister. Current Outpatient Prescriptions  Medication Sig Dispense Refill  . ALPRAZolam (XANAX) 1 MG tablet Take 1 mg by mouth at bedtime as needed. anxiety       . amoxicillin-clavulanate (AUGMENTIN) 875-125 MG per tablet Take 1 tablet by mouth 2 (two) times daily.  20 tablet  0  . calcium citrate-vitamin D (CITRACAL+D) 315-200 MG-UNIT per tablet Take 1 tablet by mouth 3 (three) times daily.      . cetirizine (ZYRTEC) 10 MG tablet Take 10 mg by mouth every morning.        . furosemide (LASIX) 40 MG tablet Take 40 mg by mouth 2 (two) times daily.      Marland Kitchen glyBURIDE (DIABETA) 5 MG tablet Take 5 mg by mouth  daily with breakfast.        . Iron Combinations (IRON COMPLEX PO) Take 1 tablet by mouth daily. Pt takes 65mg  iron       . levothyroxine (SYNTHROID, LEVOTHROID) 200 MCG tablet Take 200 mcg by mouth daily. Pt alternates between 1/2 to 1 tablet daily      . lipase/protease/amylase (CREON-10/PANCREASE) 12000 UNITS CPEP Take 1 capsule by mouth 3 (three) times daily before meals.        . Magnesium 250 MG TABS Take 1 tablet by mouth 4 (four) times daily.        . metFORMIN (GLUCOPHAGE) 500 MG tablet Take 500 mg by mouth 3 (three) times daily with meals. One tab at breakfast, one tab at lunch and two tabs for dinner      . Multiple Vitamins-Minerals (MULTIVITAMIN WITH MINERALS) tablet Take 1 tablet by mouth as needed.       Marland Kitchen omeprazole (PRILOSEC) 40 MG capsule Take 40 mg by mouth 2 (two) times daily.        Marland Kitchen OVER THE COUNTER MEDICATION Take 1 tablet by mouth 4 (four) times daily. Calcium,magnesium,zinc supplement       . potassium chloride SA (K-DUR,KLOR-CON) 20 MEQ tablet Take 20 mEq by mouth 2 (two) times daily.       . predniSONE (STERAPRED UNI-PAK) 10 MG tablet Prednisone 10 mg take  4 each am x 2 days,   2 each am x 2 days,  1 each am x2days and stop  14 tablet  0  . Vitamin D, Ergocalciferol, (DRISDOL) 50000 UNITS CAPS Take 50,000 Units by mouth daily.         No current facility-administered medications for this visit.   Allergies as of 03/03/2013 - Review Complete 03/03/2013  Allergen Reaction Noted  . Tilade (nedocromil) Anaphylaxis 03/20/2012  . Ace inhibitors  02/19/2013  . Betadine (povidone iodine)  08/19/2012    reports that she quit smoking about 35 years ago. Her smoking use included Cigarettes. She has a 10 pack-year smoking history. She has never used smokeless tobacco. She reports that  drinks alcohol. She reports that she does not use illicit drugs.     Review of Systems: Pertinent positive and negative review of systems were noted in the above HPI section. All other review  of systems were otherwise negative.  Vital signs were reviewed in today's medical record Physical Exam: General: Well developed , well nourished, no acute distress Skin: anicteric Head: Normocephalic and atraumatic Eyes:  sclerae anicteric, EOMI Ears: Normal auditory acuity Mouth: No deformity or lesions Neck: Supple, no masses or thyromegaly Lungs: Clear throughout to auscultation Heart: Regular rate and rhythm; no murmurs, rubs or bruits Abdomen: Soft, non tender and non distended. No masses, hepatosplenomegaly or hernias noted. Normal Bowel sounds Rectal:deferred Musculoskeletal: Symmetrical with no gross deformities  Skin: No lesions on visible extremities Pulses:  Normal pulses noted Extremities: No clubbing, cyanosis, edema or deformities noted Neurological: Alert oriented x 4, grossly nonfocal Cervical Nodes:  No significant cervical adenopathy Inguinal Nodes: No significant inguinal adenopathy Psychological:  Alert and cooperative. Normal mood and affect

## 2013-03-03 NOTE — Assessment & Plan Note (Signed)
Plan colonoscopy every 5 years 

## 2013-03-03 NOTE — Assessment & Plan Note (Signed)
In view of recent rectal bleeding we'll proceed with colonoscopy at this time

## 2013-03-03 NOTE — Patient Instructions (Addendum)
You have been scheduled for a colonoscopy with propofol. Please follow written instructions given to you at your visit today.  Please pick up your prep kit at the pharmacy within the next 1-3 days. If you use inhalers (even only as needed), please bring them with you on the day of your procedure.  

## 2013-03-03 NOTE — Patient Instructions (Addendum)
Augmentin 875 mg twice daily x 10 days Prednisone 10 mg take  4 each am x 2 days,   2 each am x 2 days,  1 each am x2days and stop  Omeprazole Take 30- 60 min before your first and last meals of the day  For cough mucinex dm max dose is 1200 mg every 12 hours as needed  GERD (REFLUX)  is an extremely common cause of respiratory symptoms, many times with no significant heartburn at all.    It can be treated with medication, but also with lifestyle changes including avoidance of late meals, excessive alcohol, smoking cessation, and avoid fatty foods, chocolate, peppermint, colas, red wine, and acidic juices such as orange juice.  NO MINT OR MENTHOL PRODUCTS SO NO COUGH DROPS  USE SUGARLESS CANDY INSTEAD (jolley ranchers or Stover's)  NO OIL BASED VITAMINS - use powdered substitutes.   I will refer you refer you back to Dr Arline Asp for continued surveillance and refer to Thoracic surgery if the benefit of more aggressive early diagnosis is worth the risk of surgery to find any recurrence of your disease.  I will be happy to see you back for the cough if needed with a CT sinus the next step if not better - call Libby at [phone] to set up.

## 2013-03-03 NOTE — Progress Notes (Signed)
  Subjective:    Patient ID: Courtney Stevens, female    DOB: 18-Apr-1956  MRN: [id number]  HPI  1 yowf quit smoking 1979 with am cough that completely resolved then dx'ed with bile duct sugery at [facility] and treatement with chemo and RT completed Oct 2010 with CT scans q 6 month referred by [facility] with pulmonary nodules 1-3 mm.  03/03/2013 1st pulmonary cc new onset moderate severity persistent daily cough 3 months indolent onset worse in ams prod of yellow mucus but no assoc sinus complaints, did have "ear infection" rx with doxy, erythromycina dn zpak.    No obvious daytime variabilty or assoc sob or cp or chest tightness, subjective wheeze overt sinus or hb symptoms. No unusual exp hx or h/o childhood pna/ asthma or premature birth to her knowledge.   Sleeping ok without nocturnal  or early am exacerbation  of respiratory  c/o's or need for noct saba. Also denies any obvious fluctuation of symptoms with weather or environmental changes or other aggravating or alleviating factors except as outlined above       Review of Systems  Constitutional: Negative for fever, chills and unexpected weight change.  HENT: Negative for ear pain, nosebleeds, congestion, sore throat, rhinorrhea, sneezing, trouble swallowing, dental problem, voice change, postnasal drip and sinus pressure.   Eyes: Negative for visual disturbance.  Respiratory: Positive for cough. Negative for choking and shortness of breath.   Cardiovascular: Negative for chest pain and leg swelling.  Gastrointestinal: Negative for vomiting, abdominal pain and diarrhea.  Genitourinary: Negative for difficulty urinating.  Musculoskeletal: Negative for arthralgias.  Skin: Negative for rash.  Neurological: Negative for tremors, syncope and headaches.  Hematological: Does not bruise/bleed easily.       Objective:   Physical Exam  slt hoarse amb wf nad Wt Readings from Last 3 Encounters:  03/03/13 161 lb 6.4 oz (73.211 kg)  02/25/13  162 lb 3.2 oz (73.573 kg)  08/19/12 163 lb 1.6 oz (73.982 kg)    HEENT: nl dentition, turbinates, and orophanx. Nl external ear canals without cough reflex   NECK :  without JVD/Nodes/TM/ nl carotid upstrokes bilaterally   LUNGS: no acc muscle use, clear to A and P bilaterally without cough on insp or exp maneuvers   CV:  RRR  no s3 or murmur or increase in P2, no edema   ABD:  soft and nontender with nl excursion in the supine position. No bruits or organomegaly, bowel sounds nl  MS:  warm without deformities, calf tenderness, cyanosis or clubbing  SKIN: warm and dry without lesions    NEURO:  alert, approp, no deficits    CT chest 02/19/13 Compared to the prior examination there are now  innumerable tiny pulmonary nodules scattered throughout the lungs  bilaterally. These appear to be randomly distributed within the  lungs, with no definite craniocaudal gradient. These nodules range  in size from 1-3 mm. No acute consolidative airspace disease. No  pleural effusions.     Assessment & Plan:

## 2013-03-03 NOTE — Assessment & Plan Note (Addendum)
Despite elevated tumor markers she's had no evidence for recurrent disease by CT scan. Pulmonary nodules seen at her last CT scan are nonspecific. Plan to followup CT scan of the chest, per Dr. Arline Asp, in May 2014.

## 2013-03-04 ENCOUNTER — Encounter: Payer: Self-pay | Admitting: Gastroenterology

## 2013-03-06 ENCOUNTER — Encounter: Payer: Self-pay | Admitting: Internal Medicine

## 2013-03-06 DIAGNOSIS — R918 Other nonspecific abnormal finding of lung field: Secondary | ICD-10-CM | POA: Insufficient documentation

## 2013-03-06 NOTE — Assessment & Plan Note (Signed)
Although there are clearly abnormalities on CT scan, they should probably be considered "microscopic" since not obvious on plain cxr .     In the setting of obvious "macroscopic" health issues,  I am very reluctatnt to embark on an invasive w/u at this point but will arrange consevative  follow up and in the meantime see what we can do to address the patient's subjective concerns.    These lesions are very unlikely to be sampled on a blind bronchoscopy and not large enough for any other imaging modality to help, including PET.  The ddx includes granumatous dz esp since the pattern is not typical of mets, but a tissue dx would be needed either way to further clarify the source of the CT abn or repeat the study in 3-6 months to see if any lesions are amenable to bx then  Discussed in detail all the  indications, usual  risks and alternatives  relative to the benefits with patient who agrees to review limited options with dr Arline Asp and consider  refer to Thoracic surgery if the benefit of more aggressive early diagnosis is worth the risk of surgery to find any recurrence of your disease.

## 2013-03-06 NOTE — Assessment & Plan Note (Signed)
The most common causes of chronic cough in immunocompetent adults include the following: upper airway cough syndrome (UACS), previously referred to as postnasal drip syndrome (PNDS), which is caused by variety of rhinosinus conditions; (2) asthma; (3) GERD; (4) chronic bronchitis from cigarette smoking or other inhaled environmental irritants; (5) nonasthmatic eosinophilic bronchitis; and (6) bronchiectasis.   These conditions, singly or in combination, have accounted for up to 94% of the causes of chronic cough in prospective studies.   Other conditions have constituted no >6% of the causes in prospective studies These have included bronchogenic carcinoma, chronic interstitial pneumonia, sarcoidosis, left ventricular failure, ACEI-induced cough, and aspiration from a condition associated with pharyngeal dysfunction.    Chronic cough is often simultaneously caused by more than one condition. A single cause has been found from 38 to 82% of the time, multiple causes from 18 to 62%. Multiply caused cough has been the result of three diseases up to 42% of the time.      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   These are the same pts (now being labeled as having "irritable larynx syndrome" by some cough centers) who not infrequently have a history of having failed to tolerate ace inhibitors,  dry powder inhalers or biphosphonates or report having atypical reflux symptoms that don't respond to standard doses of PPI , and are easily confused as having aecopd or  asthma flares by even experienced allergists/ pulmonologists.  Will start with rx for sinusits/gerd then consider CT sinus next step if not resolved

## 2013-03-16 ENCOUNTER — Other Ambulatory Visit: Payer: Self-pay | Admitting: Gastroenterology

## 2013-03-16 ENCOUNTER — Ambulatory Visit (AMBULATORY_SURGERY_CENTER): Payer: BC Managed Care – PPO | Admitting: Gastroenterology

## 2013-03-16 ENCOUNTER — Encounter: Payer: Self-pay | Admitting: Gastroenterology

## 2013-03-16 VITALS — BP 122/68 | HR 50 | Temp 98.3°F | Resp 18 | Ht 67.0 in | Wt 161.0 lb

## 2013-03-16 DIAGNOSIS — K5730 Diverticulosis of large intestine without perforation or abscess without bleeding: Secondary | ICD-10-CM

## 2013-03-16 DIAGNOSIS — Z860100 Personal history of colon polyps, unspecified: Secondary | ICD-10-CM

## 2013-03-16 DIAGNOSIS — C240 Malignant neoplasm of extrahepatic bile duct: Secondary | ICD-10-CM

## 2013-03-16 DIAGNOSIS — D126 Benign neoplasm of colon, unspecified: Secondary | ICD-10-CM

## 2013-03-16 DIAGNOSIS — Z800 Family history of malignant neoplasm of digestive organs: Secondary | ICD-10-CM

## 2013-03-16 DIAGNOSIS — K921 Melena: Secondary | ICD-10-CM

## 2013-03-16 DIAGNOSIS — K8689 Other specified diseases of pancreas: Secondary | ICD-10-CM

## 2013-03-16 LAB — GLUCOSE, CAPILLARY: Glucose-Capillary: 86 mg/dL (ref 70–99)

## 2013-03-16 MED ORDER — SODIUM CHLORIDE 0.9 % IV SOLN: 500.0000 mL | INTRAVENOUS | Status: DC

## 2013-03-16 NOTE — Patient Instructions (Addendum)
YOU HAD AN ENDOSCOPIC PROCEDURE TODAY AT THE [facility]: Refer to the procedure report that was given to you for any specific questions about what was found during the examination.  If the procedure report does not answer your questions, please call your gastroenterologist to clarify.  If you requested that your care partner not be given the details of your procedure findings, then the procedure report has been included in a sealed envelope for you to review at your convenience later.  YOU SHOULD EXPECT: Some feelings of bloating in the abdomen. Passage of more gas than usual.  Walking can help get rid of the air that was put into your GI tract during the procedure and reduce the bloating. If you had a lower endoscopy (such as a colonoscopy or flexible sigmoidoscopy) you may notice spotting of blood in your stool or on the toilet paper. If you underwent a bowel prep for your procedure, then you may not have a normal bowel movement for a few days.  DIET: Your first meal following the procedure should be a light meal and then it is ok to progress to your normal diet.  A half-sandwich or bowl of soup is an example of a good first meal.  Heavy or fried foods are harder to digest and may make you feel nauseous or bloated.  Likewise meals heavy in dairy and vegetables can cause extra gas to form and this can also increase the bloating.  Drink plenty of fluids but you should avoid alcoholic beverages for 24 hours.  ACTIVITY: Your care partner should take you home directly after the procedure.  You should plan to take it easy, moving slowly for the rest of the day.  You can resume normal activity the day after the procedure however you should NOT DRIVE or use heavy machinery for 24 hours (because of the sedation medicines used during the test).    SYMPTOMS TO REPORT IMMEDIATELY: A gastroenterologist can be reached at any hour.  During normal business hours, 8:30 AM to 5:00 PM Monday through Friday,  call [phone].  After hours and on weekends, please call the GI answering service at [phone] who will take a message and have the physician on call contact you.   Following lower endoscopy (colonoscopy or flexible sigmoidoscopy):  Excessive amounts of blood in the stool  Significant tenderness or worsening of abdominal pains  Swelling of the abdomen that is new, acute  Fever of 100F or higher  Following upper endoscopy (EGD)  Vomiting of blood or coffee ground material  New chest pain or pain under the shoulder blades  Painful or persistently difficult swallowing  New shortness of breath  Fever of 100F or higher  Black, tarry-looking stools  FOLLOW UP: If any biopsies were taken you will be contacted by phone or by letter within the next 1-3 weeks.  Call your gastroenterologist if you have not heard about the biopsies in 3 weeks.  Our staff will call the home number listed on your records the next business day following your procedure to check on you and address any questions or concerns that you may have at that time regarding the information given to you following your procedure. This is a courtesy call and so if there is no answer at the home number and we have not heard from you through the emergency physician on call, we will assume that you have returned to your regular daily activities without incident.  SIGNATURES/CONFIDENTIALITY: You and/or your care   partner have signed paperwork which will be entered into your electronic medical record.  These signatures attest to the fact that that the information above on your After Visit Summary has been reviewed and is understood.  Full responsibility of the confidentiality of this discharge information lies with you and/or your care-partner.  

## 2013-03-16 NOTE — Progress Notes (Signed)
Called to room to assist during endoscopic procedure.  Patient ID and intended procedure confirmed with present staff. Received instructions for my participation in the procedure from the performing physician.  

## 2013-03-16 NOTE — Progress Notes (Signed)
Lidocaine-40mg IV prior to Propofol InductionPropofol given over incremental dosages 

## 2013-03-16 NOTE — Progress Notes (Signed)
Patient did not experience any of the following events: a burn prior to discharge; a fall within the facility; wrong site/side/patient/procedure/implant event; or a hospital transfer or hospital admission upon discharge from the facility. (G8907) Patient did not have preoperative order for IV antibiotic SSI prophylaxis. (G8918)  

## 2013-03-16 NOTE — Op Note (Signed)
[facility] [address] [place], [address]   COLONOSCOPY PROCEDURE REPORT  PATIENT: Courtney Stevens, Courtney Stevens  MR#: [id number] BIRTHDATE: 05-06-1956 , 57  yrs. old GENDER: Female ENDOSCOPIST: Louis Meckel, MD REFERRED [id number] Oneta Rack, M.D.  Kimberlee Nearing, M.D. PROCEDURE DATE:  03/16/2013 PROCEDURE:   Colonoscopy with snare polypectomy ASA CLASS:   Class II INDICATIONS:Rectal Bleeding and Patient's immediate family history of colon cancer. MEDICATIONS: MAC sedation, administered by CRNA and propofol (Diprivan) 250mg  IV  DESCRIPTION OF PROCEDURE:   After the risks benefits and alternatives of the procedure were thoroughly explained, informed consent was obtained.  A digital rectal exam revealed no abnormalities of the rectum.   The LB [id number]  endoscope was introduced through the anus and advanced to the cecum, which was identified by both the appendix and ileocecal valve. No adverse events experienced.   The quality of the prep was Suprep good  The instrument was then slowly withdrawn as the colon was fully examined.      COLON FINDINGS: A pedunculated polyp measuring 15 mm in size was found in the sigmoid colon.  A polypectomy was performed using snare cautery.  The resection was complete and the polyp tissue was completely retrieved.   Mild diverticulosis was noted in the sigmoid colon.   The colon mucosa was otherwise normal. Retroflexed views revealed no abnormalities. The time to cecum=7 minutes 42 seconds.  Withdrawal time=8 minutes 05 seconds.  The scope was withdrawn and the procedure completed. COMPLICATIONS: There were no complications.  ENDOSCOPIC IMPRESSION: 1.   Pedunculated polyp measuring 15 mm in size was found in the sigmoid colon; polypectomy was performed using snare cautery 2.   Mild diverticulosis was noted in the sigmoid colon 3.   The colon mucosa was otherwise normal  RECOMMENDATIONS: Colonoscopy in 3 years in view of  the size of the polyp   eSigned:  Louis Meckel, MD 03/16/2013 10:37 AM   cc:

## 2013-03-17 ENCOUNTER — Telehealth: Payer: Self-pay | Admitting: *Deleted

## 2013-03-17 NOTE — Telephone Encounter (Signed)
  Follow up Call-  Call back number 03/16/2013 10/03/2011  Post procedure Call Back phone  # [phone]  Husband, Kathlene November [phone]  Permission to leave phone message Yes -     Patient questions:  Do you have a fever, pain , or abdominal swelling? no Pain Score  0 *  Have you tolerated food without any problems? yes  Have you been able to return to your normal activities? yes  Do you have any questions about your discharge instructions: Diet   no Medications  no Follow up visit  no  Do you have questions or concerns about your Care? no  Actions: * If pain score is 4 or above: No action needed, pain <4.

## 2013-03-26 ENCOUNTER — Encounter: Payer: Self-pay | Admitting: Gastroenterology

## 2013-03-29 ENCOUNTER — Encounter: Payer: Self-pay | Admitting: Oncology

## 2013-03-29 NOTE — Progress Notes (Signed)
This patient underwent a colonoscopy with snare polypectomy Dr. Melvia Heaps on 03/16/2013.  There was a pedunculated polyp measuring 15 mm in size which was found in the sigmoid colon. Polypectomy was performed using snare cautery. There was also mild diverticulosis involving the sigmoid colon.  A repeat colonoscopy in 3 years was recommended.

## 2013-04-01 ENCOUNTER — Ambulatory Visit
Admission: RE | Admit: 2013-04-01 | Discharge: 2013-04-01 | Disposition: A | Discharge status: Home / Self Care | Payer: BC Managed Care – PPO | Source: Ambulatory Visit | Attending: Internal Medicine | Admitting: Internal Medicine

## 2013-04-01 DIAGNOSIS — Z1231 Encounter for screening mammogram for malignant neoplasm of breast: Secondary | ICD-10-CM

## 2013-04-14 ENCOUNTER — Telehealth: Payer: Self-pay | Admitting: Medical Oncology

## 2013-04-14 ENCOUNTER — Ambulatory Visit (HOSPITAL_COMMUNITY)
Admission: RE | Admit: 2013-04-14 | Discharge: 2013-04-14 | Disposition: A | Discharge status: Home / Self Care | Payer: BC Managed Care – PPO | Source: Ambulatory Visit | Attending: Oncology | Admitting: Oncology

## 2013-04-14 DIAGNOSIS — Z8509 Personal history of malignant neoplasm of other digestive organs: Secondary | ICD-10-CM | POA: Insufficient documentation

## 2013-04-14 DIAGNOSIS — M899 Disorder of bone, unspecified: Secondary | ICD-10-CM | POA: Insufficient documentation

## 2013-04-14 DIAGNOSIS — C240 Malignant neoplasm of extrahepatic bile duct: Secondary | ICD-10-CM

## 2013-04-14 DIAGNOSIS — I700 Atherosclerosis of aorta: Secondary | ICD-10-CM | POA: Insufficient documentation

## 2013-04-14 DIAGNOSIS — Z923 Personal history of irradiation: Secondary | ICD-10-CM | POA: Insufficient documentation

## 2013-04-14 DIAGNOSIS — I2510 Atherosclerotic heart disease of native coronary artery without angina pectoris: Secondary | ICD-10-CM | POA: Insufficient documentation

## 2013-04-14 DIAGNOSIS — R918 Other nonspecific abnormal finding of lung field: Secondary | ICD-10-CM | POA: Insufficient documentation

## 2013-04-14 DIAGNOSIS — Z9221 Personal history of antineoplastic chemotherapy: Secondary | ICD-10-CM | POA: Insufficient documentation

## 2013-04-14 NOTE — Progress Notes (Signed)
Quick Note:  Please notify patient and call/fax these results to patient's doctors. ______ 

## 2013-04-14 NOTE — Telephone Encounter (Signed)
I called pt and left a message per Dr.Murinson that her CT was negative. If any questions or concerns to call our office.

## 2013-08-30 ENCOUNTER — Ambulatory Visit: Payer: BC Managed Care – PPO | Admitting: Oncology

## 2013-08-30 ENCOUNTER — Other Ambulatory Visit: Payer: BC Managed Care – PPO | Admitting: Lab

## 2013-08-31 ENCOUNTER — Ambulatory Visit (HOSPITAL_BASED_OUTPATIENT_CLINIC_OR_DEPARTMENT_OTHER): Payer: BC Managed Care – PPO | Admitting: Internal Medicine

## 2013-08-31 ENCOUNTER — Other Ambulatory Visit (HOSPITAL_BASED_OUTPATIENT_CLINIC_OR_DEPARTMENT_OTHER): Payer: BC Managed Care – PPO | Admitting: Lab

## 2013-08-31 ENCOUNTER — Other Ambulatory Visit: Payer: Self-pay | Admitting: Internal Medicine

## 2013-08-31 ENCOUNTER — Encounter: Payer: Self-pay | Admitting: Internal Medicine

## 2013-08-31 VITALS — BP 128/63 | HR 70 | Temp 98.8°F | Resp 18 | Ht 67.0 in | Wt 162.0 lb

## 2013-08-31 DIAGNOSIS — R918 Other nonspecific abnormal finding of lung field: Secondary | ICD-10-CM

## 2013-08-31 DIAGNOSIS — C240 Malignant neoplasm of extrahepatic bile duct: Secondary | ICD-10-CM

## 2013-08-31 DIAGNOSIS — Z800 Family history of malignant neoplasm of digestive organs: Secondary | ICD-10-CM

## 2013-08-31 DIAGNOSIS — C259 Malignant neoplasm of pancreas, unspecified: Secondary | ICD-10-CM

## 2013-08-31 LAB — CBC WITH DIFFERENTIAL/PLATELET
Basophils Absolute: 0 10*3/uL (ref 0.0–0.1)
EOS%: 0.6 % (ref 0.0–7.0)
Eosinophils Absolute: 0 10*3/uL (ref 0.0–0.5)
HCT: 41 % (ref 34.8–46.6)
HGB: 13.7 g/dL (ref 11.6–15.9)
MCH: 29.1 pg (ref 25.1–34.0)
MCV: 87.3 fL (ref 79.5–101.0)
NEUT#: 5.5 10*3/uL (ref 1.5–6.5)
NEUT%: 81.4 % — ABNORMAL HIGH (ref 38.4–76.8)
RDW: 15.2 % — ABNORMAL HIGH (ref 11.2–14.5)
lymph#: 0.8 10*3/uL — ABNORMAL LOW (ref 0.9–3.3)

## 2013-08-31 LAB — COMPREHENSIVE METABOLIC PANEL (CC13)
Albumin: 3.1 g/dL — ABNORMAL LOW (ref 3.5–5.0)
Anion Gap: 10 mEq/L (ref 3–11)
BUN: 9.4 mg/dL (ref 7.0–26.0)
CO2: 25 mEq/L (ref 22–29)
Calcium: 9.2 mg/dL (ref 8.4–10.4)
Chloride: 100 mEq/L (ref 98–109)
Creatinine: 1 mg/dL (ref 0.6–1.1)
Glucose: 119 mg/dl (ref 70–140)
Potassium: 3.7 mEq/L (ref 3.5–5.1)

## 2013-08-31 NOTE — Progress Notes (Signed)
[facility] OFFICE PROGRESS NOTE  MCKEOWN,WILLIAM Medard Decuir, MD [address] [place] [address]  DIAGNOSIS: Malignant neoplasm of extrahepatic bile ducts - Plan: CBC with Differential, Comprehensive metabolic panel, Cancer antigen 19-9, CEA  Multiple pulmonary nodules - Plan: CBC with Differential, Comprehensive metabolic panel, Cancer antigen 19-9, CEA  Family history of malignant neoplasm of gastrointestinal tract - Plan: CBC with Differential, Comprehensive metabolic panel, Cancer antigen 19-9, CEA  Chief Complaint  Patient presents with  . Malignant neoplasm of extrahepatic bile ducts    CURRENT THERAPY:  Surveillance  INTERVAL HISTORY: Courtney Stevens 57 y.o. 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  She underwent a pancreaticoduodenectomy by Dr. Rise Mu at [facility] center on 02/22/2008.  She was last seen by Dr. Arline Asp on 02/25/2013.  She denies any recent emergency room visits or hospitalizations.  She reports good energy and appetite.  She saw her PCP Dr. Lucky Cowboy this past May.  She continues creon 3 tablets with meals.  Her weight has been stable.  She reports her last colonoscopy was less than one year ago with one tubulus villous polyp removed and she has to follow up for repeat colonoscopy in 3 years.   MEDICAL HISTORY: Past Medical History  Diagnosis Date  . Cancer     bile duct ca  . GERD (gastroesophageal reflux disease)   . Diabetes mellitus   . Hypertension   . Hypothyroid     INTERIM HISTORY: has MALIGNANT NEOPLASM OF EXTRAHEPATIC BILE DUCTS; DM; PANCREATIC INSUFFICIENCY; NONSPECIFIC ABNORM FIND RAD&OTH EXAM BILI TRACT; Pancreatic cancer; Hemorrhage of rectum and anus; Personal history of colonic polyps; Family history of malignant neoplasm of gastrointestinal tract; Multiple  pulmonary nodules; and Cough on her problem list.    ALLERGIES:  is allergic to tilade; ace inhibitors; and betadine.  MEDICATIONS: has a current medication list which includes the following prescription(s): alprazolam, atenolol, calcium citrate-vitamin d, cetirizine, furosemide, glyburide, iron combinations, levothyroxine, lipase/protease/amylase, magnesium, metformin, multivitamin with minerals, omeprazole, OVER THE COUNTER MEDICATION, potassium chloride sa, and vitamin d (ergocalciferol).  SURGICAL HISTORY:  Past Surgical History  Procedure Laterality Date  . Gastric bypass  2004  . Cholecystectomy  1992  . Abdominal hysterectomy  1990  . Bile duct resection  2009   PROBLEM LIST:  1. 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 The patient  underwent pancreaticoduodenectomy by Dr. Rise Mu at [facility] on 02/22/2008. There was extensive involvement of peri-ductal soft tissue and extension to the duodenal wall and pancreas. Lymphovascular and perineural invasion was present. The posterior soft tissue margin was involved with tumor. Four out of 18 regional lymph nodes were  involved by tumor. Tumor stage was pT4 pN1 pM0. Following  surgery, the patient underwent radiation treatments in conjunction  with continuous infusion 5 fluorouracil here in [place] from  04/01/2008 through 05/19/2008. She then received gemcitabine from 05/19/2008 through 12/02/2007. Because of a marked increase in CA19-9, the patient was felt to have a recurrence. This was not confirmed histologically and I believe imaging studies were also non-definitive. The patient received gemcitabine from 02/17/2009 through 05/25/2009. She then received Taxotere for 3 cycles from 06/15/2009 through 07/27/2009. The patient remains clinically disease-free on no treatment.  2. Elevated tumor markers. CEA and CA19-9 have been  elevated with fluctuating levels.  3. Bilateral pulmonary nodules seen on CT scans from 02/19/2013.  4. History of malabsorption detected in early 2011, currently under  successful treatment.  5. Positive hepatitis B surface antibody and hepatitis B core  antibody.  6. Diabetes mellitus.  7. Hypertension.  8. Hypothyroidism diagnosed April 2011.  9. History of gastric bypass for obesity, Roux-en-Y, in 2004.  10.Diverticulosis.  11.Systolic ejection murmur.  REVIEW OF SYSTEMS:   Constitutional: Denies fevers, chills or abnormal weight loss Eyes: Denies blurriness of vision Ears, nose, mouth, throat, and face: Denies mucositis or sore throat Respiratory: Denies cough, dyspnea or wheezes Cardiovascular: Denies palpitation, chest discomfort or lower extremity swelling Gastrointestinal:  Denies nausea, heartburn or change in bowel habits Skin: Denies abnormal skin rashes Lymphatics: Denies new lymphadenopathy or easy bruising Neurological:Denies numbness, tingling or new weaknesses Behavioral/Psych: Mood is stable, no new changes  All other systems were reviewed with the patient and are negative.  PHYSICAL EXAMINATION: ECOG PERFORMANCE STATUS: 0 - Asymptomatic  Blood pressure 128/63, pulse 70, temperature 98.8 F (37.1 C), temperature source Oral, resp. rate 18, height 5\' 7"  (1.702 m), weight 162 lb (73.483 kg).  GENERAL:alert, no distress and comfortable SKIN: skin color, texture, turgor are normal, no rashes or significant lesions EYES: normal, Conjunctiva are pink and non-injected, sclera clear OROPHARYNX:no exudate, no erythema and lips, buccal mucosa, and tongue normal  NECK: supple, thyroid normal size, non-tender, without nodularity LYMPH:  no palpable lymphadenopathy in the cervical, axillary or supraclavicular LUNGS: clear to auscultation and percussion with normal breathing effort HEART: regular rate & rhythm and SEM II/VI and no lower extremity  ABDOMEN:abdomen soft,  non-tender and normal bowel sounds, post surgical scars well healed. No organomegaly appreciated.  Musculoskeletal:no cyanosis of digits and no clubbing  NEURO: alert & oriented x 3 with fluent speech, no focal motor/sensory deficits   LABORATORY DATA: Results for orders placed in visit on 08/31/13 (from the past 48 hour(s))  CBC WITH DIFFERENTIAL     Status: Abnormal   Collection Time    08/31/13 10:32 AM      Result Value Range   WBC 6.8  3.9 - 10.3 10e3/uL   NEUT# 5.5  1.5 - 6.5 10e3/uL   HGB 13.7  11.6 - 15.9 g/dL   HCT 45.4  09.8 - 11.9 %   Platelets 214  145 - 400 10e3/uL   MCV 87.3  79.5 - 101.0 fL   MCH 29.1  25.1 - 34.0 pg   MCHC 33.3  31.5 - 36.0 g/dL   RBC 1.47  8.29 - 5.62 10e6/uL   RDW 15.2 (*) 11.2 - 14.5 %   lymph# 0.8 (*) 0.9 - 3.3 10e3/uL   MONO# 0.4  0.1 - 0.9 10e3/uL   Eosinophils Absolute 0.0  0.0 - 0.5 10e3/uL   Basophils Absolute 0.0  0.0 - 0.1 10e3/uL   NEUT% 81.4 (*) 38.4 - 76.8 %   LYMPH% 11.5 (*) 14.0 - 49.7 %   MONO% 6.0  0.0 - 14.0 %   EOS% 0.6  0.0 - 7.0 %   BASO% 0.5  0.0 - 2.0 %  COMPREHENSIVE METABOLIC PANEL (CC13)     Status: Abnormal   Collection Time    08/31/13 10:32 AM      Result Value Range   Sodium 135 (*) 136 - 145 mEq/L   Potassium 3.7  3.5 - 5.1 mEq/L   Chloride 100  98 - 109 mEq/L   CO2 25  22 - 29 mEq/L   Glucose  119  70 - 140 mg/dl   BUN 9.4  7.0 - 16.1 mg/dL   Creatinine 1.0  0.6 - 1.1 mg/dL   Total Bilirubin 0.96  0.20 - 1.20 mg/dL   Alkaline Phosphatase 138  40 - 150 U/L   AST 35 (*) 5 - 34 U/L   ALT 26  0 - 55 U/L   Total Protein 7.0  6.4 - 8.3 g/dL   Albumin 3.1 (*) 3.5 - 5.0 g/dL   Calcium 9.2  8.4 - 04.5 mg/dL   Anion Gap 10  3 - 11 mEq/L    Labs:  Lab Results  Component Value Date   WBC 6.8 08/31/2013   HGB 13.7 08/31/2013   HCT 41.0 08/31/2013   MCV 87.3 08/31/2013   PLT 214 08/31/2013   NEUTROABS 5.5 08/31/2013      Chemistry      Component Value Date/Time   NA 135* 08/31/2013 1032   NA 137  03/10/2012 1346   K 3.7 08/31/2013 1032   K 2.9* 03/10/2012 1346   CL 101 02/25/2013 0938   CL 87* 03/10/2012 1346   CO2 25 08/31/2013 1032   CO2 32 03/10/2012 1346   BUN 9.4 08/31/2013 1032   BUN 23 03/10/2012 1346   CREATININE 1.0 08/31/2013 1032   CREATININE 1.10 03/10/2012 1346      Component Value Date/Time   CALCIUM 9.2 08/31/2013 1032   CALCIUM 10.0 03/10/2012 1346   ALKPHOS 138 08/31/2013 1032   ALKPHOS 92 03/10/2012 1346   AST 35* 08/31/2013 1032   AST 17 03/10/2012 1346   ALT 26 08/31/2013 1032   ALT 15 03/10/2012 1346   BILITOT 0.37 08/31/2013 1032   BILITOT 0.4 03/10/2012 1346      RADIOGRAPHIC STUDIES:  Personally reviewed by me.  IMPRESSION: (Apr 14, 2013) 1. The previously noted 1-3 mm pulmonary nodules have either  resolved, decreased in size or are stable compared to the prior  examination. Given the lack of a clearly definable craniocaudal  gradient, and the behavior these nodules, these favored to reflect  benign disease (particularly if there has been no interval  chemotherapy or other intervention), but are nonspecific.  Continued attention on future follow up studies is suggested.  2. Atherosclerosis, including three-vessel coronary artery  disease. Please note that although the presence of coronary artery  calcium documents the presence of coronary artery disease, the  severity of this disease and any potential stenosis cannot be  assessed on this non-gated CT examination. Assessment for  potential risk factor modification, dietary therapy or  pharmacologic therapy may be warranted, if clinically indicated.  3. Additional incidental findings, as above.   ASSESSMENT: Courtney Stevens 57 y.o. female with a history of Malignant neoplasm of extrahepatic bile ducts - Plan: CBC with Differential, Comprehensive metabolic panel, Cancer antigen 19-9, CEA  Multiple pulmonary nodules - Plan: CBC with Differential, Comprehensive metabolic panel, Cancer antigen 19-9,  CEA  Family history of malignant neoplasm of gastrointestinal tract - Plan: CBC with Differential, Comprehensive metabolic panel, Cancer antigen 19-9, CEA   PLAN:  1. Malignant neoplasm of extrahepatic bile ducts --Clinically, she continues to do very well.  She is without evidence for recurrent disease for over 5 years since the time of her diagnosis.  She had repeat imaging for the pulmonary nodules that showed resolution favoring a benign diseasedoing her last visit.  We will plan to see Mrs. Langner again in 6 months, at which time we will check CBC, chemistries,  including CEA and CA 19-9.    All questions were answered. The patient knows to call the clinic with any problems, questions or concerns. We can certainly see the patient much sooner if necessary.  I spent 10 minutes counseling the patient face to face. The total time spent in the appointment was 15 minutes.    Jasreet Dickie, MD 08/31/2013 10:19 PM

## 2013-09-01 LAB — CANCER ANTIGEN 19-9: CA 19-9: 28.7 U/mL (ref <35.0)

## 2013-09-01 LAB — CEA: CEA: 12 ng/mL — ABNORMAL HIGH (ref 0.0–5.0)

## 2013-09-02 ENCOUNTER — Telehealth: Payer: Self-pay | Admitting: Internal Medicine

## 2013-09-02 NOTE — Telephone Encounter (Signed)
s.w. pt and advised on April 2015 appt....pt ok and awre °

## 2013-09-05 ENCOUNTER — Encounter: Payer: Self-pay | Admitting: Internal Medicine

## 2013-09-23 ENCOUNTER — Ambulatory Visit: Payer: Self-pay | Admitting: Emergency Medicine

## 2013-10-05 ENCOUNTER — Ambulatory Visit (INDEPENDENT_AMBULATORY_CARE_PROVIDER_SITE_OTHER): Payer: BC Managed Care – PPO

## 2013-10-05 ENCOUNTER — Encounter: Payer: Self-pay | Admitting: Podiatry

## 2013-10-05 ENCOUNTER — Ambulatory Visit (INDEPENDENT_AMBULATORY_CARE_PROVIDER_SITE_OTHER): Payer: BC Managed Care – PPO | Admitting: Podiatry

## 2013-10-05 VITALS — BP 113/64 | HR 50 | Resp 16 | Ht 67.0 in | Wt 160.0 lb

## 2013-10-05 DIAGNOSIS — M216X9 Other acquired deformities of unspecified foot: Secondary | ICD-10-CM

## 2013-10-05 DIAGNOSIS — L84 Corns and callosities: Secondary | ICD-10-CM

## 2013-10-05 DIAGNOSIS — M898X9 Other specified disorders of bone, unspecified site: Secondary | ICD-10-CM

## 2013-10-05 NOTE — Progress Notes (Signed)
Courtney Stevens presents today as a 57 year old white female whom we have seen in the past 3 years. She's complaining of painful reactive hyperkeratosis the plantar aspect of the bilateral foot and the medial aspect of each great toe. She is now wearing her orthotics at all. She states that the majority of the shoes that she wears are narrowed flats. Her feet are painful due to the thick calluses bilateral.  Objective: I have reviewed her past medical history medications and allergies. Her vital signs are stable she is alert and oriented x3. Pulses are strongly palpable bilateral neurologic sensorium is intact bilateral. Deep tendon reflexes are intact bilateral and brisk. Muscle strength was 5 over 5 dorsiflexors plantar flexors inverters and evertors. Intrinsic musculature is intact. Orthopedic evaluation demonstrates mild pes planus bilateral. Mild gastroc equinus. Take reactive hyperkeratosis to the plantar aspect of the first metatarsal and fifth metatarsals bilaterally as well as reactive hyperkeratosis to the plantar medial aspect of the IP joint bilaterally.  Assessment: Mild gastroc equinus. Resulted in reactive hyperkeratosis bilateral forefoot.  Plan: Debridement of reactive hyperkeratosis bilateral. Discussed the possibility of surgery.

## 2013-10-05 NOTE — Patient Instructions (Signed)
Corns and Calluses A thickening of the skin layer (usually over bony areas, such as toe joints) is known as a corn. Two types of corns exist: hard corns and soft corns. Calluses are painless areas of skin thickening that are caused by repeated pressure or irritation. Corns tend to affect toe joints and the skin between the toes; whereas, a callus can appear on any part of the body (especially the hands, feet, or knees).  SYMPTOMS   Corn:  Presence of a small (1/8 to 3/8 inch [3 to 10 mm in diameter]), painful bump on the side or over the joint of a toe.  Hard corns are more common on the outer portion of the little (fifth) toe at the joint.  Soft corns are more common between bony bumps (prominences), usually between the fourth and fifth toes or between the second and third toes.  Callus:  A rough, thickened area of skin that appears after repeated pressure or irritation. CAUSES  The purpose of corns and calluses is to protect an area of skin from injury caused by repeated irritation (rubbing or squeezing). The presence of pressure causes the skin cells to grow at a faster rate than the cells of unaffected areas. This leads to an overgrowth (corn or callus). As apposed to hard corns, soft corns tend to develop between toes, because there is more moisture. Soft corns are often the result of prolonged shoe wear, which leads to increased perspiration and moisture.  RISK INCREASES WITH:  Shoes that are too tight.  Occupations or sports that involve repetitive pressure on the hands (racquetball and baseball) or sudden stops on hard surfaces (track and tennis).  Sports that require the athlete to wear shoes, perspire, or wear clothing or protective gear that causes the production of heat and friction. PREVENTION  Properly fitted shoes and equipment.  Modify activities to prevent constant pressure on specific areas of skin.  If possible, wear padding over areas of skin that are exposed to  repeated pressure or irritation.  Keep the area between the toes dry (with powder or by removing shoes often).  Relieve shoe pressure by stretching the areas of the shoe that cause the pressure and or use ointments to soften leather shoes. PROGNOSIS  Corns and calluses typically subside if the activity that causes them is eliminated. Recovery may take up to 3 weeks. Recurrence is likely even with treatment if the cause is not removed.  RELATED COMPLICATIONS  If one overcompensates in an attempt to avoid pain, he or she may experience pain in other areas due to the changes in body movements (mechanics). TREATMENT  The best way to treat corns and calluses is to remove the source of pressure. Corn and callus pads may be helpful in reducing pressure on the affected skin. For soft corns, try to keep the affected area dry. If you cannot find shoes that fit properly, a shoe repair shop may be able to alter your shoes to reduce pressure. Occasionally a cushion for the bottom of the foot (metatarsal bar) worn within the shoe may relieve pressure on corns or calluses of the foot. For calluses, you may be able to peel or rub the thickened area with a pumice stone, sandstone, callus file, or with sandpaper to remove the callus; wetting the affected area may make this process more effective. Do not cut the corn or callus with a razor or knife. If the corn or callus must be removed, then a medically trained person should perform   the procedure. After peeling away the upper layers of a corn once or twice a day, it may be recommended to apply a non-prescription 5% to 10% salicylic ointment and cover the area with a bandage. It very uncommon to have the bony bumps (at toe joints) surgically removed. MEDICATION   If pain medication is necessary, nonsteroidal anti-inflammatory medications, such as aspirin and ibuprofen, or other minor pain relievers, such as acetaminophen, are often recommended. Contact your caregiver  immediately if any bleeding, stomach upset, or signs of an allergic reaction occur.  Topical salicylic ointments (5% to 10%) may be of benefit.  Prescription pain medications may be given by a caregiver. Use only as directed and only as much as you need.  Soak the foot for 20 minutes, twice a day, in a gallon of warm water. This may help to soften corns and calluses. Care should be taken to thoroughly dry the foot, especially between the toes, after soaking. SEEK MEDICAL CARE IF:   Symptoms get worse or do not improve in 2 weeks despite treatment.  Any signs of infection develop, including redness, swelling, increased pain or tenderness, or increased warmth around the corn or callus.  New, unexplained symptoms develop (drugs used in treatment may produce side effects). Document Released: 11/04/2005 Document Revised: 01/27/2012 Document Reviewed: 02/16/2009 [employer] Patient Information 2014 [employer].  

## 2013-10-06 ENCOUNTER — Encounter: Payer: Self-pay | Admitting: Emergency Medicine

## 2013-10-06 ENCOUNTER — Ambulatory Visit: Payer: BC Managed Care – PPO | Admitting: Emergency Medicine

## 2013-10-06 VITALS — BP 104/70 | HR 60 | Temp 97.8°F | Resp 16 | Ht 67.0 in | Wt 160.0 lb

## 2013-10-06 DIAGNOSIS — D649 Anemia, unspecified: Secondary | ICD-10-CM

## 2013-10-06 DIAGNOSIS — R252 Cramp and spasm: Secondary | ICD-10-CM

## 2013-10-06 DIAGNOSIS — E559 Vitamin D deficiency, unspecified: Secondary | ICD-10-CM

## 2013-10-06 DIAGNOSIS — E119 Type 2 diabetes mellitus without complications: Secondary | ICD-10-CM

## 2013-10-06 DIAGNOSIS — I10 Essential (primary) hypertension: Secondary | ICD-10-CM

## 2013-10-06 DIAGNOSIS — E039 Hypothyroidism, unspecified: Secondary | ICD-10-CM

## 2013-10-06 DIAGNOSIS — R350 Frequency of micturition: Secondary | ICD-10-CM

## 2013-10-06 LAB — CBC WITH DIFFERENTIAL/PLATELET
Basophils Absolute: 0 10*3/uL (ref 0.0–0.1)
Eosinophils Absolute: 0.2 10*3/uL (ref 0.0–0.7)
Eosinophils Relative: 2 % (ref 0–5)
HCT: 43.1 % (ref 36.0–46.0)
Lymphocytes Relative: 21 % (ref 12–46)
MCH: 29.1 pg (ref 26.0–34.0)
MCHC: 33.6 g/dL (ref 30.0–36.0)
MCV: 86.4 fL (ref 78.0–100.0)
Monocytes Absolute: 1 10*3/uL (ref 0.1–1.0)
Platelets: 392 10*3/uL (ref 150–400)
RDW: 14.8 % (ref 11.5–15.5)
WBC: 11.3 10*3/uL — ABNORMAL HIGH (ref 4.0–10.5)

## 2013-10-06 MED ORDER — DIAZEPAM 2 MG PO TABS: 2.0000 mg | ORAL_TABLET | Freq: Three times a day (TID) | ORAL | Status: DC | PRN

## 2013-10-06 NOTE — Patient Instructions (Addendum)
Diet for Gastroesophageal Reflux Disease, Adult Reflux (acid reflux) is when acid from your stomach flows up into the esophagus. When acid comes in contact with the esophagus, the acid causes irritation and soreness (inflammation) in the esophagus. When reflux happens often or so severely that it causes damage to the esophagus, it is called gastroesophageal reflux disease (GERD). Nutrition therapy can help ease the discomfort of GERD. FOODS OR DRINKS TO AVOID OR LIMIT  Smoking or chewing tobacco. Nicotine is one of the most potent stimulants to acid production in the gastrointestinal tract.  Caffeinated and decaffeinated coffee and black tea.  Regular or low-calorie carbonated beverages or energy drinks (caffeine-free carbonated beverages are allowed).   Strong spices, such as black pepper, white pepper, red pepper, cayenne, curry powder, and chili powder.  Peppermint or spearmint.  Chocolate.  High-fat foods, including meats and fried foods. Extra added fats including oils, butter, salad dressings, and nuts. Limit these to less than 8 tsp per day.  Fruits and vegetables if they are not tolerated, such as citrus fruits or tomatoes.  Alcohol.  Any food that seems to aggravate your condition. If you have questions regarding your diet, call your caregiver or a registered dietitian. OTHER THINGS THAT MAY HELP GERD INCLUDE:   Eating your meals slowly, in a relaxed setting.  Eating 5 to 6 small meals per day instead of 3 large meals.  Eliminating food for a period of time if it causes distress.  Not lying down until 3 hours after eating a meal.  Keeping the head of your bed raised 6 to 9 inches (15 to 23 cm) by using a foam wedge or blocks under the legs of the bed. Lying flat may make symptoms worse.  Being physically active. Weight loss may be helpful in reducing reflux in overweight or obese adults.  Wear loose fitting clothing EXAMPLE MEAL PLAN This meal plan is approximately  2,000 calories based on https://www.[facility].org/ meal planning guidelines. Breakfast   cup cooked oatmeal.  1 cup strawberries.  1 cup low-fat milk.  1 oz almonds. Snack  1 cup cucumber slices.  6 oz yogurt (made from low-fat or fat-free milk). Lunch  2 slice whole-wheat bread.  2 oz sliced [place].  2 tsp mayonnaise.  1 cup blueberries.  1 cup snap peas. Snack  6 whole-wheat crackers.  1 oz string cheese. Dinner   cup brown rice.  1 cup mixed veggies.  1 tsp olive oil.  3 oz grilled fish. Document Released: 11/04/2005 Document Revised: 01/27/2012 Document Reviewed: 09/20/2011 [facility] Patient Information 2014 [place], [place]. Restless Legs Syndrome Restless legs syndrome is a movement disorder. It may also be called a sensori-motor disorder.  CAUSES  No one knows what specifically causes restless legs syndrome, but it tends to run in families. It is also more common in people with low iron, in pregnancy, in people who need dialysis, and those with nerve damage (neuropathy).Some medications may make restless legs syndrome worse.Those medications include drugs to treat high blood pressure, some heart conditions, nausea, colds, allergies, and depression. SYMPTOMS Symptoms include uncomfortable sensations in the legs. These leg sensations are worse during periods of inactivity or rest. They are also worse while sitting or lying down. Individuals that have the disorder describe sensations in the legs that feel like: Pulling. Drawing. Crawling. Worming. Boring. Tingling. Pins and needles. Prickling. Pain. The sensations are usually accompanied by an overwhelming urge to move the legs. Sudden muscle jerks may also occur. Movement provides temporary relief from  the discomfort. In rare cases, the arms may also be affected. Symptoms may interfere with going to sleep (sleep onset insomnia). Restless legs syndrome may also be related to periodic limb movement disorder  (PLMD). PLMD is another more common motor disorder. It also causes interrupted sleep. The symptoms from PLMD usually occur most often when you are awake. TREATMENT  Treatment for restless legs syndrome is symptomatic. This means that the symptoms are treated.  Massage and cold compresses may provide temporary relief. Walk, stretch, or take a cold or hot bath. Get regular exercise and a good night's sleep. Avoid caffeine, alcohol, nicotine, and medications that can make it worse. Do activities that provide mental stimulation like discussions, needlework, and video games. These may be helpful if you are not able to walk or stretch. Some medications are effective in relieving the symptoms. However, many of these medications have side effects. Ask your caregiver about medications that may help your symptoms. Correcting iron deficiency may improve symptoms for some patients. Document Released: 10/25/2002 Document Revised: 01/27/2012 Document Reviewed: 01/31/2011 [facility] Patient Information 2014 [place], [place].

## 2013-10-07 LAB — IRON AND TIBC
TIBC: 331 ug/dL (ref 250–470)
UIBC: 292 ug/dL (ref 125–400)

## 2013-10-07 LAB — BASIC METABOLIC PANEL WITH GFR
BUN: 15 mg/dL (ref 6–23)
Calcium: 9.5 mg/dL (ref 8.4–10.5)
Chloride: 98 mEq/L (ref 96–112)
Creat: 1.23 mg/dL — ABNORMAL HIGH (ref 0.50–1.10)
GFR, Est Non African American: 48 mL/min — ABNORMAL LOW

## 2013-10-07 LAB — URINALYSIS, ROUTINE W REFLEX MICROSCOPIC
Bilirubin Urine: NEGATIVE
Ketones, ur: NEGATIVE mg/dL
Protein, ur: NEGATIVE mg/dL
Urobilinogen, UA: 0.2 mg/dL (ref 0.0–1.0)

## 2013-10-07 LAB — URINALYSIS, MICROSCOPIC ONLY: Bacteria, UA: NONE SEEN

## 2013-10-07 LAB — HEPATIC FUNCTION PANEL
AST: 27 U/L (ref 0–37)
Albumin: 3.6 g/dL (ref 3.5–5.2)
Alkaline Phosphatase: 136 U/L — ABNORMAL HIGH (ref 39–117)
Total Protein: 7.1 g/dL (ref 6.0–8.3)

## 2013-10-07 LAB — TSH: TSH: 2.102 u[IU]/mL (ref 0.350–4.500)

## 2013-10-07 LAB — VITAMIN B12: Vitamin B-12: 625 pg/mL (ref 211–911)

## 2013-10-07 NOTE — Progress Notes (Signed)
Subjective:    Patient ID: Courtney Stevens, female    DOB: November 23, 1955, 57 y.o.   MRN: [id number]  HPI Comments: 57 yo female presents for 3 month F/U for HTN, Cholesterol, DM, D. Deficient. She has not been exercising regularly or eating healthy with increased stress with family. BS has been ok. BP has not been checking regularly. She has noticed increased reflux with increased stress level. She also has been having more leg cramps with right greater than the left. She has not been drinking as much H2O and has also noticed increased urine frequency over last couple of days. She is also concerned about increased sinus pressure and congestion. She denies fever or production. She has not tried any OTC remedies for any of her complaints.   Hypertension  Hyperlipidemia Associated symptoms include myalgias.  Diabetes Associated symptoms include fatigue.  Gastrophageal Reflux Associated symptoms include fatigue.    Current Outpatient Prescriptions on File Prior to Visit  Medication Sig Dispense Refill  . ALPRAZolam (XANAX) 1 MG tablet Take 1 mg by mouth at bedtime as needed. anxiety       . atenolol (TENORMIN) 25 MG tablet Take 25 mg by mouth daily.      . calcium citrate-vitamin D (CITRACAL+D) 315-200 MG-UNIT per tablet Take 1 tablet by mouth 3 (three) times daily.      . cetirizine (ZYRTEC) 10 MG tablet Take 10 mg by mouth every morning.        . furosemide (LASIX) 40 MG tablet Take 40 mg by mouth 2 (two) times daily.      Marland Kitchen glyBURIDE (DIABETA) 5 MG tablet Take 5 mg by mouth daily.       . Iron Combinations (IRON COMPLEX PO) Take 1 tablet by mouth daily. Pt takes 65mg  iron       . levothyroxine (SYNTHROID, LEVOTHROID) 200 MCG tablet Take 200 mcg by mouth daily. Pt alternates between 1/2 to 1 tablet daily      . lipase/protease/amylase (CREON-10/PANCREASE) 12000 UNITS CPEP Take 1 capsule by mouth 3 (three) times daily before meals.        . Magnesium 250 MG TABS Take 1 tablet by mouth 4 (four)  times daily.        . metFORMIN (GLUCOPHAGE) 500 MG tablet Take 500 mg by mouth 3 (three) times daily with meals. One tab at breakfast, one tab at lunch and two tabs for dinner      . Multiple Vitamins-Minerals (MULTIVITAMIN WITH MINERALS) tablet Take 1 tablet by mouth as needed.       Marland Kitchen omeprazole (PRILOSEC) 40 MG capsule Take 40 mg by mouth 2 (two) times daily.        Marland Kitchen OVER THE COUNTER MEDICATION Take 1 tablet by mouth 4 (four) times daily. Calcium,magnesium,zinc supplement       . potassium chloride SA (K-DUR,KLOR-CON) 20 MEQ tablet Take 20 mEq by mouth 2 (two) times daily.       . Vitamin D, Ergocalciferol, (DRISDOL) 50000 UNITS CAPS Take 50,000 Units by mouth daily.         No current facility-administered medications on file prior to visit.   ALLERGIES Tilade; Ace inhibitors; Betadine; and Reglan  Past Medical History  Diagnosis Date  . Cancer     bile duct ca  . GERD (gastroesophageal reflux disease)   . Diabetes mellitus   . Hypertension   . Hypothyroid   . Hyperlipidemia   . Asthma       Review of Systems  Constitutional: Positive for fatigue. Negative for fever.  HENT: Positive for congestion and sinus pressure.   Gastrointestinal:       Reflux increase  Genitourinary: Positive for frequency.  Musculoskeletal: Positive for myalgias.  All other systems reviewed and are negative.       Objective:   Physical Exam  Nursing note and vitals reviewed. Constitutional: She is oriented to person, place, and time. She appears well-developed and well-nourished.  HENT:  Head: Normocephalic and atraumatic.  Right Ear: External ear normal.  Left Ear: External ear normal.  Nose: Nose normal.  Mouth/Throat: Oropharynx is clear and moist.  Cloudy TMs  Eyes: Pupils are equal, round, and reactive to light.  Neck: Normal range of motion. Neck supple. No thyromegaly present.  Cardiovascular: Normal rate, regular rhythm, normal heart sounds and intact distal pulses.    Pulmonary/Chest: Effort normal and breath sounds normal.  Abdominal: Soft. Bowel sounds are normal. She exhibits no distension and no mass. There is no tenderness.  Musculoskeletal: Normal range of motion.  Lymphadenopathy:    She has no cervical adenopathy.  Neurological: She is alert and oriented to person, place, and time. She has normal reflexes. No cranial nerve deficit.  Skin: Skin is warm and dry.  Psychiatric: She has a normal mood and affect. Judgment normal.          Assessment & Plan:  1. 3 month F/U for HTN, Cholesterol, DM, D Deficient  check labs 2. Leg cramping check labs, stretching, increase H2o, potassium. Try Valium 2 mg AD to see if any relief, D/C Xanax. 3. Urine frequency check labs 4. Increased GERD- Hygiene explained, try to decrease stress if no change w/c for Rx 5. Allergic Rhinitis vs early sinus infection- Switch to Allegra, check labs >30 min visit due to multiple concerns

## 2013-10-09 LAB — URINE CULTURE: Colony Count: >=100000

## 2013-10-11 ENCOUNTER — Telehealth: Payer: Self-pay | Admitting: *Deleted

## 2013-10-11 ENCOUNTER — Other Ambulatory Visit: Payer: Self-pay | Admitting: Emergency Medicine

## 2013-10-11 DIAGNOSIS — N390 Urinary tract infection, site not specified: Secondary | ICD-10-CM

## 2013-10-11 DIAGNOSIS — R899 Unspecified abnormal finding in specimens from other organs, systems and tissues: Secondary | ICD-10-CM

## 2013-10-11 NOTE — Telephone Encounter (Signed)
Message copied by Laurence Spates on Mon Oct 11, 2013 12:24 PM ------      Message from: [place], [place] R      Created: Mon Oct 11, 2013  8:57 AM       CBC elevated with positive UTI Levaquin 500 mg #10 AD was called in for Patient. Increase H2O. Mild alk phos elevation will continue to monitor may be from infection. Iron mild low but HGb ok, increase green leafy veggies. Continue rest same. Recheck 1 month NV UA C/s. HFP ------

## 2013-10-11 NOTE — Telephone Encounter (Signed)
Spoke with patient about lab results and instructions for Levaquin abx.  Patient will take abx and f/u in one month for recheck.

## 2013-10-24 ENCOUNTER — Other Ambulatory Visit: Payer: Self-pay | Admitting: Internal Medicine

## 2013-11-03 ENCOUNTER — Encounter: Payer: Self-pay | Admitting: Internal Medicine

## 2013-11-03 ENCOUNTER — Ambulatory Visit (INDEPENDENT_AMBULATORY_CARE_PROVIDER_SITE_OTHER): Payer: BC Managed Care – PPO | Admitting: Internal Medicine

## 2013-11-03 VITALS — BP 128/86 | HR 76 | Temp 100.0°F | Resp 16 | Wt 163.8 lb

## 2013-11-03 DIAGNOSIS — Z9884 Bariatric surgery status: Secondary | ICD-10-CM | POA: Insufficient documentation

## 2013-11-03 DIAGNOSIS — I10 Essential (primary) hypertension: Secondary | ICD-10-CM

## 2013-11-03 DIAGNOSIS — J45909 Unspecified asthma, uncomplicated: Secondary | ICD-10-CM

## 2013-11-03 DIAGNOSIS — E782 Mixed hyperlipidemia: Secondary | ICD-10-CM | POA: Insufficient documentation

## 2013-11-03 DIAGNOSIS — S43499A Other sprain of unspecified shoulder joint, initial encounter: Secondary | ICD-10-CM

## 2013-11-03 DIAGNOSIS — N3000 Acute cystitis without hematuria: Secondary | ICD-10-CM

## 2013-11-03 MED ORDER — CIPROFLOXACIN HCL 500 MG PO TABS: 500.0000 mg | ORAL_TABLET | Freq: Two times a day (BID) | ORAL | Status: AC

## 2013-11-03 MED ORDER — PREDNISONE 20 MG PO TABS: 20.0000 mg | ORAL_TABLET | ORAL | Status: DC

## 2013-11-03 NOTE — Progress Notes (Signed)
   Subjective:    Patient ID: Evon Slack, female    DOB: 07-Jul-1956, 57 y.o.   MRN: [id number]  Urinary Tract Infection  This is a new problem. The current episode started yesterday. The problem occurs every urination. The problem has been gradually worsening. The quality of the pain is described as burning and stabbing. The pain is moderate. The maximum temperature recorded prior to her arrival was 100 - 100.9 F. The fever has been present for 1 - 2 days. She is sexually active. There is no history of pyelonephritis. Associated symptoms include chills, frequency, sweats and urgency. Pertinent negatives include no discharge, flank pain, hematuria, hesitancy, nausea or vomiting. She has tried nothing for the symptoms. Her past medical history is significant for recurrent UTIs.  Shoulder Pain  The pain is present in the right shoulder and neck. This is a new problem. The current episode started yesterday. There has been no history of extremity trauma. The problem occurs constantly. The problem has been rapidly worsening. The quality of the pain is described as sharp. The pain is moderate. Associated symptoms include a fever. She has tried nothing for the symptoms.      Review of Systems  Constitutional: Positive for fever, chills and diaphoresis. Negative for activity change.  HENT: Negative.  Negative for congestion.   Eyes: Negative.   Respiratory: Negative.   Cardiovascular: Negative.   Gastrointestinal: Negative.  Negative for nausea and vomiting.  Genitourinary: Positive for dysuria, urgency, frequency and difficulty urinating. Negative for hesitancy, hematuria, flank pain, vaginal bleeding, vaginal discharge, enuresis and pelvic pain.  Musculoskeletal: Positive for arthralgias.       Objective:   Physical Exam  Constitutional: She appears well-nourished.  HENT:  Head: Atraumatic.  Mouth/Throat: No oropharyngeal exudate.  Eyes: EOM are normal. Pupils are equal, round, and reactive  to light.  Neck: Normal range of motion. Neck supple. No thyromegaly present.  Cardiovascular: Normal rate, regular rhythm and normal heart sounds.   No murmur heard. Pulmonary/Chest: Effort normal.  Abdominal: Soft. She exhibits no mass. There is tenderness. There is no rebound and no guarding.  Musculoskeletal: She exhibits tenderness.  Tender Rt shoulder - anterior joint line and decreased ROM-rotation due to pain  Lymphadenopathy:    She has no cervical adenopathy.          Assessment & Plan:  1. Acute cystitis  - ciprofloxacin (CIPRO) 500 MG tablet; Take 1 tablet (500 mg total) by mouth 2 (two) times daily.  Dispense: 30 tablet; Refill: 0 - Urine Microscopic - Urine culture  2. Sprain and strain of other specified sites of shoulder and upper arm  - predniSONE (DELTASONE) 20 MG tablet; Take 1 tablet (20 mg total) by mouth See admin instructions. 1 tab 3 x day for 3 days, then 1 tab 2 x day for 3 days, then 1 tab 1 x day for 5 days  Dispense: 20 tablet; Refill: 0

## 2013-11-03 NOTE — Patient Instructions (Signed)
Urinary Tract Infection  Urinary tract infections (UTIs) can develop anywhere along your urinary tract. Your urinary tract is your body's drainage system for removing wastes and extra water. Your urinary tract includes two kidneys, two ureters, a bladder, and a urethra. Your kidneys are a pair of bean-shaped organs. Each kidney is about the size of your fist. They are located below your ribs, one on each side of your spine.  CAUSES  Infections are caused by microbes, which are microscopic organisms, including fungi, viruses, and bacteria. These organisms are so small that they can only be seen through a microscope. Bacteria are the microbes that most commonly cause UTIs.  SYMPTOMS   Symptoms of UTIs may vary by age and gender of the patient and by the location of the infection. Symptoms in young women typically include a frequent and intense urge to urinate and a painful, burning feeling in the bladder or urethra during urination. Older women and men are more likely to be tired, shaky, and weak and have muscle aches and abdominal pain. A fever may mean the infection is in your kidneys. Other symptoms of a kidney infection include pain in your back or sides below the ribs, nausea, and vomiting.  DIAGNOSIS  To diagnose a UTI, your caregiver will ask you about your symptoms. Your caregiver also will ask to provide a urine sample. The urine sample will be tested for bacteria and white blood cells. White blood cells are made by your body to help fight infection.  TREATMENT   Typically, UTIs can be treated with medication. Because most UTIs are caused by a bacterial infection, they usually can be treated with the use of antibiotics. The choice of antibiotic and length of treatment depend on your symptoms and the type of bacteria causing your infection.  HOME CARE INSTRUCTIONS   If you were prescribed antibiotics, take them exactly as your caregiver instructs you. Finish the medication even if you feel better after you  have only taken some of the medication.   Drink enough water and fluids to keep your urine clear or pale yellow.   Avoid caffeine, tea, and carbonated beverages. They tend to irritate your bladder.   Empty your bladder often. Avoid holding urine for long periods of time.   Empty your bladder before and after sexual intercourse.   After a bowel movement, women should cleanse from front to back. Use each tissue only once.  SEEK MEDICAL CARE IF:    You have back pain.   You develop a fever.   Your symptoms do not begin to resolve within 3 days.  SEEK IMMEDIATE MEDICAL CARE IF:    You have severe back pain or lower abdominal pain.   You develop chills.   You have nausea or vomiting.   You have continued burning or discomfort with urination.  MAKE SURE YOU:    Understand these instructions.   Will watch your condition.   Will get help right away if you are not doing well or get worse.  Document Released: 08/14/2005 Document Revised: 05/05/2012 Document Reviewed: 12/13/2011  [employer] Patient Information 2014 [employer].

## 2013-11-04 ENCOUNTER — Ambulatory Visit: Payer: Self-pay | Admitting: Internal Medicine

## 2013-11-04 LAB — URINALYSIS, MICROSCOPIC ONLY
Casts: NONE SEEN
Crystals: NONE SEEN
Squamous Epithelial / LPF: NONE SEEN

## 2013-11-04 LAB — URINE CULTURE: Colony Count: NO GROWTH

## 2013-11-06 ENCOUNTER — Other Ambulatory Visit: Payer: Self-pay | Admitting: Internal Medicine

## 2013-11-08 ENCOUNTER — Telehealth: Payer: Self-pay | Admitting: *Deleted

## 2013-11-08 NOTE — Telephone Encounter (Signed)
Message copied by Reggy Eye on Mon Nov 08, 2013  4:43 PM ------      Message from: Lucky Cowboy      Created: Sat Nov 06, 2013  1:43 PM       Call ok - no infection ------

## 2013-11-16 ENCOUNTER — Ambulatory Visit (INDEPENDENT_AMBULATORY_CARE_PROVIDER_SITE_OTHER): Payer: BC Managed Care – PPO | Admitting: Internal Medicine

## 2013-11-16 ENCOUNTER — Encounter: Payer: Self-pay | Admitting: Internal Medicine

## 2013-11-16 VITALS — BP 122/74 | HR 100 | Temp 99.0°F | Resp 16 | Wt 168.0 lb

## 2013-11-16 DIAGNOSIS — R7309 Other abnormal glucose: Secondary | ICD-10-CM

## 2013-11-16 DIAGNOSIS — R509 Fever, unspecified: Secondary | ICD-10-CM

## 2013-11-16 DIAGNOSIS — F05 Delirium due to known physiological condition: Secondary | ICD-10-CM

## 2013-11-16 DIAGNOSIS — R531 Weakness: Secondary | ICD-10-CM

## 2013-11-16 DIAGNOSIS — Z79899 Other long term (current) drug therapy: Secondary | ICD-10-CM

## 2013-11-16 LAB — CBC WITH DIFFERENTIAL/PLATELET
Eosinophils Absolute: 0 10*3/uL (ref 0.0–0.7)
Eosinophils Relative: 0 % (ref 0–5)
Lymphs Abs: 1.2 10*3/uL (ref 0.7–4.0)
MCH: 28.8 pg (ref 26.0–34.0)
MCV: 89.2 fL (ref 78.0–100.0)
Platelets: 302 10*3/uL (ref 150–400)
RBC: 3.99 MIL/uL (ref 3.87–5.11)

## 2013-11-16 LAB — HEPATIC FUNCTION PANEL
ALT: 32 U/L (ref 0–35)
AST: 61 U/L — ABNORMAL HIGH (ref 0–37)
Albumin: 2.8 g/dL — ABNORMAL LOW (ref 3.5–5.2)
Total Bilirubin: 0.4 mg/dL (ref 0.3–1.2)
Total Protein: 5.6 g/dL — ABNORMAL LOW (ref 6.0–8.3)

## 2013-11-16 LAB — HEMOGLOBIN A1C: Hgb A1c MFr Bld: 7.1 % — ABNORMAL HIGH (ref <5.7)

## 2013-11-16 LAB — MAGNESIUM: Magnesium: 1.7 mg/dL (ref 1.5–2.5)

## 2013-11-16 LAB — BASIC METABOLIC PANEL WITH GFR
CO2: 22 mEq/L (ref 19–32)
Calcium: 8.3 mg/dL — ABNORMAL LOW (ref 8.4–10.5)
Creat: 0.94 mg/dL (ref 0.50–1.10)
Glucose, Bld: 343 mg/dL — ABNORMAL HIGH (ref 70–99)

## 2013-11-16 MED ORDER — LEVOFLOXACIN 500 MG PO TABS: 500.0000 mg | ORAL_TABLET | Freq: Every day | ORAL | Status: DC

## 2013-11-16 MED ORDER — PREDNISONE 20 MG PO TABS: ORAL_TABLET | ORAL | Status: DC

## 2013-11-16 NOTE — Patient Instructions (Signed)
Fever, Adult A fever is a higher than normal body temperature. In an adult, an oral temperature around 98.6 F (37 C) is considered normal. A temperature of 100.4 F (38 C) or higher is generally considered a fever. Mild or moderate fevers generally have no long-term effects and often do not require treatment. Extreme fever (greater than or equal to 106 F or 41.1 C) can cause seizures. The sweating that may occur with repeated or prolonged fever may cause dehydration. Elderly people can develop confusion during a fever. A measured temperature can vary with:  Age.  Time of day.  Method of measurement (mouth, underarm, rectal, or ear). The fever is confirmed by taking a temperature with a thermometer. Temperatures can be taken different ways. Some methods are accurate and some are not.  An oral temperature is used most commonly. Electronic thermometers are fast and accurate.  An ear temperature will only be accurate if the thermometer is positioned as recommended by the manufacturer.  A rectal temperature is accurate and done for those adults who have a condition where an oral temperature cannot be taken.  An underarm (axillary) temperature is not accurate and not recommended. Fever is a symptom, not a disease.  CAUSES   Infections commonly cause fever.  Some noninfectious causes for fever include:  Some arthritis conditions.  Some thyroid or adrenal gland conditions.  Some immune system conditions.  Some types of cancer.  A medicine reaction.  High doses of certain street drugs such as methamphetamine.  Dehydration.  Exposure to high outside or room temperatures.  Occasionally, the source of a fever cannot be determined. This is sometimes called a "fever of unknown origin" (FUO).  Some situations may lead to a temporary rise in body temperature that may go away on its own. Examples are:  Childbirth.  Surgery.  Intense exercise. HOME CARE INSTRUCTIONS   Take  appropriate medicines for fever. Follow dosing instructions carefully. If you use acetaminophen to reduce the fever, be careful to avoid taking other medicines that also contain acetaminophen. Do not take aspirin for a fever if you are younger than age 19. There is an association with Reye's syndrome. Reye's syndrome is a rare but potentially deadly disease.  If an infection is present and antibiotics have been prescribed, take them as directed. Finish them even if you start to feel better.  Rest as needed.  Maintain an adequate fluid intake. To prevent dehydration during an illness with prolonged or recurrent fever, you may need to drink extra fluid.Drink enough fluids to keep your urine clear or pale yellow.  Sponging or bathing with room temperature water may help reduce body temperature. Do not use ice water or alcohol sponge baths.  Dress comfortably, but do not over-bundle. SEEK MEDICAL CARE IF:   You are unable to keep fluids down.  You develop vomiting or diarrhea.  You are not feeling at least partly better after 3 days.  You develop new symptoms or problems. SEEK IMMEDIATE MEDICAL CARE IF:   You have shortness of breath or trouble breathing.  You develop excessive weakness.  You are dizzy or you faint.  You are extremely thirsty or you are making little or no urine.  You develop new pain that was not there before (such as in the head, neck, chest, back, or abdomen).  You have persistant vomiting and diarrhea for more than 1 to 2 days.  You develop a stiff neck or your eyes become sensitive to light.  You develop a   skin rash.  You have a fever or persistent symptoms for more than 2 to 3 days.  You have a fever and your symptoms suddenly get worse. MAKE SURE YOU:   Understand these instructions.  Will watch your condition.  Will get help right away if you are not doing well or get worse. Document Released: 04/30/2001 Document Revised: 01/27/2012 Document  Reviewed: 09/05/2011 [employer] Patient Information 2014 [employer].  

## 2013-11-16 NOTE — Progress Notes (Signed)
Patient ID: Courtney Stevens, female   DOB: 10/19/1956, 57 y.o.   MRN: [id number]   This very nice 57 y.o.MWF  with Hypertension, Hyperlipidemia, Pre-Diabetes, Hx/o Bilary tract cancer and Vitamin D Deficiency presents with c/o 3 week history of intermittant fever , chill, sweats and today became more lethargic - ? disoriented and c/o rt low back and side pains. She has had some sinus pressurre and congestion and yellow sputum and nasal secretions.   BP has been controlled at home. Today's BP: 122/74 mmHg . Patient denies any cardiac type chest pain, palpitations, dyspnea/orthopnea/PND, dizziness, claudication, or dependent edema.    Also, the patient has history of PreDiabetes with FBG's reported ranging betw 70's - 90's. Patient denies any symptoms of reactive hypoglycemia, diabetic polys, paresthesias or visual blurring.     Medication Sig Dispense Refill  . ALPRAZolam (XANAX) 1 MG tablet Take 1 mg by mouth at bedtime as needed. anxiety       . atenolol (TENORMIN) 25 MG tablet Take 25 mg by mouth daily.      . calcium citrate-vitamin D (CITRACAL+D) 315-200 MG-UNIT per tablet Take 1 tablet by mouth 3 (three) times daily.      . cetirizine (ZYRTEC) 10 MG tablet Take 10 mg by mouth every morning.        . furosemide (LASIX) 40 MG tablet Take 40 mg by mouth 2 (two) times daily.      Marland Kitchen glyBURIDE (DIABETA) 5 MG tablet Take 5 mg by mouth daily.       . Iron Combinations (IRON COMPLEX PO) Take 1 tablet by mouth daily. Pt takes 65mg  iron       . levothyroxine (SYNTHROID, LEVOTHROID) 200 MCG tablet Take 200 mcg by mouth daily. Pt alternates between 1/2 to 1 tablet daily      . lipase/protease/amylase (CREON-10/PANCREASE) 12000 UNITS CPEP Take 1 capsule by mouth 3 (three) times daily before meals.        . Magnesium 250 MG TABS Take 1 tablet by mouth 4 (four) times daily.        . metFORMIN (GLUCOPHAGE) 500 MG tablet Take 500 mg by mouth 3 (three) times daily with meals. One tab at breakfast, one tab at  lunch and two tabs for dinner      . Multiple Vitamins-Minerals (MULTIVITAMIN WITH MINERALS) tablet Take 1 tablet by mouth as needed.       Marland Kitchen omeprazole (PRILOSEC) 40 MG capsule TAKE 1 CAPSULE BY MOUTH TWICE DAILY FOR ACID REFLUX  62 capsule  6  . OVER THE COUNTER MEDICATION Take 1 tablet by mouth 4 (four) times daily. Calcium,magnesium,zinc supplement       . potassium chloride SA (K-DUR,KLOR-CON) 20 MEQ tablet Take 20 mEq by mouth 2 (two) times daily.       . Vitamin D, Ergocalciferol, (DRISDOL) 50000 UNITS CAPS Take 50,000 Units by mouth daily.           Allergies  Allergen Reactions  . Tilade [Nedocromil] Anaphylaxis  . Ace Inhibitors   . Betadine [Povidone Iodine]   . Reglan [Metoclopramide]     PMHx:   Past Medical History  Diagnosis Date  . Cancer     bile duct ca  . GERD (gastroesophageal reflux disease)   . Diabetes mellitus   . Hypertension   . Hypothyroid   . Hyperlipidemia   . Asthma     FHx:    Reviewed / unchanged  SHx:    Reviewed / unchanged  Systems  Review: Constitutional: Denies wt changes, headaches, insomnia, fatigue, change in appetite. Has had fever, chills & sweats as above. Eyes: Denies redness, blurred vision, diplopia, discharge, itchy, watery eyes.  ENT: Denies discharge, epistaxis, earache, hearing loss, dental pain, tinnitus, or vertigo Has had  sinus pain & sore throat. CV: Denies chest pain, palpitations, irregular heartbeat, syncope, dyspnea, diaphoresis, orthopnea, PND, claudication, edema. Respiratory: denies, dyspnea, DOE, pleurisy, hoarseness, laryngitis, wheezing. Has has some cough and sputum as above. Gastrointestinal: Denies dysphagia, odynophagia, heartburn, reflux, water brash, abdominal pain or cramps, nausea, vomiting, bloating, diarrhea, constipation, hematemesis, melena, hematochezia,  or hemorrhoids. Genitourinary: Has had recent UTI,  but denies dysuria, frequency, urgency, nocturia, hesitancy, discharge, hematuria, flank  pain. Musculoskeletal: Denies arthralgias, myalgias, stiffness, jt. swelling, pain, limp, strain/sprain.  Skin: Denies pruritus, rash, hives, warts, acne, eczema, change in skin lesion(s). Neuro: No weakness, tremor, incoordination, spasms, paresthesia, or pain. Psychiatric: Denies memory loss, or sensory loss. Endo: Denies change in weight, skin, hair change.  Heme/Lymph: No excessive bleeding, bruising, orenlarged lymph nodes.  BP: 122/74  Pulse: 100  Temp: 99 F (37.2 C)  Resp: 16    Estimated body mass index is 26.31 kg/(m^2) as calculated from the following:   Height as of 10/06/13: 5\' 7"  (1.702 m).   Weight as of this encounter: 168 lb (76.204 kg).  On Exam: Appears well nourished - in no distress. Eyes: PERRLA, EOMs, conjunctiva no swelling or erythema. Sinuses: Slight  frontal/maxillary tenderness ENT/Mouth: EAC's clear, TM's sl retracted w/o erythema, bulging. Nares clear w/o erythema, swelling, exudates. Oropharynx clear without erythema or exudates. Oral hygiene is good. Tongue normal, non obstructing. Hearing intact.  Neck: Supple. Thyroid nl. Car 2+/2+ without bruits, nodes or JVD. Chest: Respirations nl with BS clear & equal w/o rales, rhonchi, wheezing or stridor.  Cor: Heart sounds normal w/ regular rate and rhythm without sig. murmurs, gallops, clicks, or rubs. Peripheral pulses normal and equal  without edema.  Abdomen: Soft & bowel sounds normal. Non-tender w/o guarding, rebound, hernias, masses, or organomegaly.  Lymphatics: Unremarkable.  Musculoskeletal: Full ROM all peripheral extremities, joint stability, 5/5 strength, and normal gait.  Skin: Warm, dry without exposed rashes, lesions, ecchymosis apparent.  Neuro: Cranial nerves intact, reflexes equal bilaterally. Sensory-motor testing grossly intact. Tendon reflexes grossly intact.  Pysch: Alert & oriented x 3. Insight and judgement nl & appropriate. No ideations.  Assessment and Plan:  1. Fever - weakness  - obscure etiology - check labs and U/C - start Levaquin empirically pending labs and clinical course- to call if Sx's change or go to Er if condition worsens  2. Pre-diabetes- Continue diet, exercise, lifestyle modifications. Monitor appropriate labs.   Recommended regular exercise, BP monitoring, weight control, and discussed med and SE's. Recommended labs to assess and monitor clinical status. Further disposition pending results of labs.

## 2013-11-17 LAB — URINALYSIS, MICROSCOPIC ONLY
Bacteria, UA: NONE SEEN
Casts: NONE SEEN
Crystals: NONE SEEN
Squamous Epithelial / HPF: NONE SEEN

## 2013-11-18 DIAGNOSIS — A78 Q fever: Secondary | ICD-10-CM

## 2013-11-18 HISTORY — DX: Q fever: A78

## 2013-11-18 LAB — URINE CULTURE
Colony Count: NO GROWTH
Organism ID, Bacteria: NO GROWTH

## 2013-11-19 ENCOUNTER — Encounter (HOSPITAL_COMMUNITY): Payer: Self-pay | Admitting: Emergency Medicine

## 2013-11-19 ENCOUNTER — Emergency Department (HOSPITAL_COMMUNITY)
Admission: EM | Admit: 2013-11-19 | Discharge: 2013-11-19 | Disposition: A | Discharge status: Home / Self Care | Payer: BC Managed Care – PPO | Attending: Emergency Medicine | Admitting: Emergency Medicine

## 2013-11-19 ENCOUNTER — Emergency Department (HOSPITAL_COMMUNITY): Payer: BC Managed Care – PPO

## 2013-11-19 DIAGNOSIS — R51 Headache: Secondary | ICD-10-CM | POA: Insufficient documentation

## 2013-11-19 DIAGNOSIS — Z87891 Personal history of nicotine dependence: Secondary | ICD-10-CM | POA: Insufficient documentation

## 2013-11-19 DIAGNOSIS — E039 Hypothyroidism, unspecified: Secondary | ICD-10-CM | POA: Insufficient documentation

## 2013-11-19 DIAGNOSIS — E119 Type 2 diabetes mellitus without complications: Secondary | ICD-10-CM | POA: Insufficient documentation

## 2013-11-19 DIAGNOSIS — Z8509 Personal history of malignant neoplasm of other digestive organs: Secondary | ICD-10-CM | POA: Insufficient documentation

## 2013-11-19 DIAGNOSIS — Z79899 Other long term (current) drug therapy: Secondary | ICD-10-CM | POA: Insufficient documentation

## 2013-11-19 DIAGNOSIS — K219 Gastro-esophageal reflux disease without esophagitis: Secondary | ICD-10-CM | POA: Insufficient documentation

## 2013-11-19 DIAGNOSIS — R519 Headache, unspecified: Secondary | ICD-10-CM

## 2013-11-19 DIAGNOSIS — I10 Essential (primary) hypertension: Secondary | ICD-10-CM | POA: Insufficient documentation

## 2013-11-19 DIAGNOSIS — J45901 Unspecified asthma with (acute) exacerbation: Secondary | ICD-10-CM | POA: Insufficient documentation

## 2013-11-19 DIAGNOSIS — J069 Acute upper respiratory infection, unspecified: Secondary | ICD-10-CM | POA: Insufficient documentation

## 2013-11-19 LAB — CBC WITH DIFFERENTIAL/PLATELET
BASOS PCT: 0 % (ref 0–1)
Basophils Absolute: 0 10*3/uL (ref 0.0–0.1)
Eosinophils Absolute: 0 10*3/uL (ref 0.0–0.7)
Eosinophils Relative: 0 % (ref 0–5)
HCT: 34 % — ABNORMAL LOW (ref 36.0–46.0)
Hemoglobin: 11.3 g/dL — ABNORMAL LOW (ref 12.0–15.0)
Lymphocytes Relative: 3 % — ABNORMAL LOW (ref 12–46)
Lymphs Abs: 0.7 10*3/uL (ref 0.7–4.0)
MCH: 29.7 pg (ref 26.0–34.0)
MCHC: 33.2 g/dL (ref 30.0–36.0)
MCV: 89.2 fL (ref 78.0–100.0)
MONOS PCT: 5 % (ref 3–12)
Monocytes Absolute: 1.1 10*3/uL — ABNORMAL HIGH (ref 0.1–1.0)
NEUTROS ABS: 19.4 10*3/uL — AB (ref 1.7–7.7)
Neutrophils Relative %: 92 % — ABNORMAL HIGH (ref 43–77)
Platelets: 299 10*3/uL (ref 150–400)
RBC: 3.81 MIL/uL — AB (ref 3.87–5.11)
RDW: 14.1 % (ref 11.5–15.5)
WBC: 21.1 10*3/uL — ABNORMAL HIGH (ref 4.0–10.5)

## 2013-11-19 LAB — D-DIMER, QUANTITATIVE: D-Dimer, Quant: 2.13 ug/mL-FEU — ABNORMAL HIGH (ref 0.00–0.48)

## 2013-11-19 LAB — URINALYSIS, ROUTINE W REFLEX MICROSCOPIC
Bilirubin Urine: NEGATIVE
Glucose, UA: 250 mg/dL — AB
Hgb urine dipstick: NEGATIVE
KETONES UR: NEGATIVE mg/dL
LEUKOCYTES UA: NEGATIVE
Nitrite: NEGATIVE
PROTEIN: NEGATIVE mg/dL
Specific Gravity, Urine: 1.019 (ref 1.005–1.030)
UROBILINOGEN UA: 0.2 mg/dL (ref 0.0–1.0)
pH: 6.5 (ref 5.0–8.0)

## 2013-11-19 LAB — POCT I-STAT, CHEM 8
BUN: 18 mg/dL (ref 6–23)
CREATININE: 1.1 mg/dL (ref 0.50–1.10)
Calcium, Ion: 1.16 mmol/L (ref 1.12–1.23)
Chloride: 98 mEq/L (ref 96–112)
Glucose, Bld: 215 mg/dL — ABNORMAL HIGH (ref 70–99)
HCT: 36 % (ref 36.0–46.0)
HEMOGLOBIN: 12.2 g/dL (ref 12.0–15.0)
Potassium: 4.4 mEq/L (ref 3.7–5.3)
SODIUM: 136 meq/L — AB (ref 137–147)
TCO2: 30 mmol/L (ref 0–100)

## 2013-11-19 MED ORDER — IOHEXOL 350 MG/ML SOLN
100.0000 mL | Freq: Once | INTRAVENOUS | Status: AC | PRN
  Administered 2013-11-19: 100 mL via INTRAVENOUS

## 2013-11-19 MED ORDER — HYDROCODONE-ACETAMINOPHEN 5-325 MG PO TABS: 1.0000 | ORAL_TABLET | Freq: Four times a day (QID) | ORAL | Status: DC | PRN

## 2013-11-19 MED ORDER — IOHEXOL 300 MG/ML  SOLN: 100.0000 mL | Freq: Once | INTRAMUSCULAR | Status: DC | PRN

## 2013-11-19 NOTE — ED Notes (Addendum)
Patient from home states that she was at work and became disoriented. Since then patient has not felt well. Complaining of SOB, HA with pains in chest and back. States that she has had a fever of 100. Blood sugars have been running high-343. Patient currently taking levaquin and prednisone for upper respiratory infect.

## 2013-11-19 NOTE — ED Provider Notes (Signed)
CSN: [id number]     Arrival date & time 11/19/13  1148 History   First MD Initiated Contact with Patient 11/19/13 1202     Chief Complaint  Patient presents with  . Shortness of Breath  . Headache   (Consider location/radiation/quality/duration/timing/severity/associated sxs/prior Treatment) Patient is a 58 y.o. female presenting with shortness of breath and headaches. The history is provided by the patient (the pt complains of sob.  cough and headache.  her md started her on antibiotics and prednisone).  Shortness of Breath Severity:  Moderate Onset quality:  Gradual Timing:  Constant Progression:  Waxing and waning Chronicity:  New Context: activity   Relieved by:  Nothing Worsened by:  Nothing tried Associated symptoms: headaches   Associated symptoms: no abdominal pain, no chest pain, no cough and no rash   Headache Associated symptoms: no abdominal pain, no back pain, no congestion, no cough, no diarrhea, no fatigue, no seizures and no sinus pressure     Past Medical History  Diagnosis Date  . Cancer     bile duct ca  . GERD (gastroesophageal reflux disease)   . Diabetes mellitus   . Hypertension   . Hypothyroid   . Hyperlipidemia   . Asthma    Past Surgical History  Procedure Laterality Date  . Gastric bypass  2004  . Cholecystectomy  1992  . Bile duct resection  2009  . Appendectomy    . Tonsillectomy    . Abdominal hysterectomy  1990    BIL OOPHORECTOMY  . Tubal ligation    . Breast surgery      BIOPSY NEG   Family History  Problem Relation Age of Onset  . Colon cancer Mother   . Hypertension Mother   . Glaucoma Mother   . Diabetes Father   . Alzheimer's disease Father   . Heart attack Father   . Nephrolithiasis Father   . Emphysema Father     was a smoker  . Heart disease Father   . Nephrolithiasis Sister   . Heart attack Brother   . Nephrolithiasis Brother    History  Substance Use Topics  . Smoking status: Former Smoker -- 2.00 packs/day  for 5 years    Types: Cigarettes    Quit date: 11/18/1977  . Smokeless tobacco: Never Used  . Alcohol Use: Yes     Comment: 2-3 glasses of wine once per month    OB History   Grav Para Term Preterm Abortions TAB SAB Ect Mult Living                 Review of Systems  Constitutional: Negative for appetite change and fatigue.  HENT: Negative for congestion, ear discharge and sinus pressure.   Eyes: Negative for discharge.  Respiratory: Positive for shortness of breath. Negative for cough.   Cardiovascular: Negative for chest pain.  Gastrointestinal: Negative for abdominal pain and diarrhea.  Genitourinary: Negative for frequency and hematuria.  Musculoskeletal: Negative for back pain.  Skin: Negative for rash.  Neurological: Positive for headaches. Negative for seizures.  Psychiatric/Behavioral: Negative for hallucinations.    Allergies  Ace inhibitors; Tilade; Reglan; and Betadine  Home Medications   Current Outpatient Rx  Name  Route  Sig  Dispense  Refill  . ALPRAZolam (XANAX) 1 MG tablet   Oral   Take 1 mg by mouth at bedtime as needed. anxiety          . atenolol (TENORMIN) 25 MG tablet   Oral   Take  25 mg by mouth every evening.          . calcium citrate-vitamin D (CITRACAL+D) 315-200 MG-UNIT per tablet   Oral   Take 1 tablet by mouth 3 (three) times daily.         . cetirizine (ZYRTEC) 10 MG tablet   Oral   Take 10 mg by mouth every morning.           . furosemide (LASIX) 40 MG tablet   Oral   Take 40 mg by mouth 2 (two) times daily.         Marland Kitchen glyBURIDE (DIABETA) 5 MG tablet   Oral   Take 5 mg by mouth daily.          . Iron Combinations (IRON COMPLEX PO)   Oral   Take 1 tablet by mouth daily. Pt takes 65mg  iron          . levofloxacin (LEVAQUIN) 500 MG tablet   Oral   Take 1 tablet (500 mg total) by mouth daily.   10 tablet   0   . levothyroxine (SYNTHROID, LEVOTHROID) 200 MCG tablet   Oral   Take 100-200 mcg by mouth daily. Pt  alternates between 1/2 to 1 tablet daily         . lipase/protease/amylase (CREON-10/PANCREASE) 12000 UNITS CPEP   Oral   Take 1 capsule by mouth 3 (three) times daily before meals.           . Magnesium 250 MG TABS   Oral   Take 1 tablet by mouth 4 (four) times daily.           . metFORMIN (GLUCOPHAGE) 500 MG tablet   Oral   Take 500 mg by mouth 3 (three) times daily with meals. One tab at breakfast, one tab at lunch and two tabs for dinner         . Multiple Vitamins-Minerals (MULTIVITAMIN WITH MINERALS) tablet   Oral   Take 1 tablet by mouth as needed.          Marland Kitchen omeprazole (PRILOSEC) 40 MG capsule      TAKE 1 CAPSULE BY MOUTH TWICE DAILY FOR ACID REFLUX   62 capsule   6   . OVER THE COUNTER MEDICATION   Oral   Take 1 tablet by mouth 4 (four) times daily. Calcium,magnesium,zinc supplement          . potassium chloride SA (K-DUR,KLOR-CON) 20 MEQ tablet   Oral   Take 20 mEq by mouth 2 (two) times daily.          . predniSONE (DELTASONE) 20 MG tablet      1 tab 3 x day for 2 days, then 1 tab 2 x day for 2 days, then 1 tab 1 x day for 3 days   13 tablet   0   . Vitamin D, Ergocalciferol, (DRISDOL) 50000 UNITS CAPS   Oral   Take 50,000 Units by mouth every evening.          Marland Kitchen HYDROcodone-acetaminophen (NORCO/VICODIN) 5-325 MG per tablet   Oral   Take 1 tablet by mouth every 6 (six) hours as needed for moderate pain.   20 tablet   0    BP 116/60  Pulse 62  Temp(Src) 98.9 F (37.2 C) (Oral)  Resp 21  SpO2 97% Physical Exam  Constitutional: She is oriented to person, place, and time. She appears well-developed.  HENT:  Head: Normocephalic.  Supple neck  Eyes: Conjunctivae  and EOM are normal. No scleral icterus.  Neck: Neck supple. No thyromegaly present.  Cardiovascular: Normal rate and regular rhythm.  Exam reveals no gallop and no friction rub.   No murmur heard. Pulmonary/Chest: No stridor. She has no wheezes. She has no rales. She  exhibits no tenderness.  Abdominal: She exhibits no distension. There is no tenderness. There is no rebound.  Musculoskeletal: Normal range of motion. She exhibits no edema.  Lymphadenopathy:    She has no cervical adenopathy.  Neurological: She is oriented to person, place, and time. She exhibits normal muscle tone. Coordination normal.  Skin: No rash noted. No erythema.  Psychiatric: She has a normal mood and affect. Her behavior is normal.    ED Course  Procedures (including critical care time) Labs Review Labs Reviewed  CBC WITH DIFFERENTIAL - Abnormal; Notable for the following:    WBC 21.1 (*)    RBC 3.81 (*)    Hemoglobin 11.3 (*)    HCT 34.0 (*)    Neutrophils Relative % 92 (*)    Neutro Abs 19.4 (*)    Lymphocytes Relative 3 (*)    Monocytes Absolute 1.1 (*)    All other components within normal limits  D-DIMER, QUANTITATIVE - Abnormal; Notable for the following:    D-Dimer, Quant 2.13 (*)    All other components within normal limits  URINALYSIS, ROUTINE W REFLEX MICROSCOPIC - Abnormal; Notable for the following:    Glucose, UA 250 (*)    All other components within normal limits  POCT I-STAT, CHEM 8 - Abnormal; Notable for the following:    Sodium 136 (*)    Glucose, Bld 215 (*)    All other components within normal limits   Imaging Review Dg Chest 2 View  11/19/2013   CLINICAL DATA:  Shortness of breath and headache.  EXAM: CHEST  2 VIEW  COMPARISON:  03/02/2010  FINDINGS: Lungs are adequately inflated without consolidation or effusion. Cardiomediastinal silhouette is within normal. There is mild spondylosis of the spine. Multiple surgical clips over the abdomen.  IMPRESSION: No active cardiopulmonary disease.   Electronically Signed   By: Marin Olp M.D.   On: 11/19/2013 13:25   Ct Head Wo Contrast  11/19/2013   CLINICAL DATA:  Headache, no known injury; history of adenocarcinoma of the bile duct  EXAM: CT HEAD WITHOUT CONTRAST  TECHNIQUE: Contiguous axial images  were obtained from the base of the skull through the vertex without intravenous contrast.  COMPARISON:  PET-CT -06/12/2009  FINDINGS: Gray-white differentiation is maintained. Incidental note is made of prominent VR spaces within the caudal medial aspects of the bilateral temporal lobes. No CT evidence for acute large territory infarct. No intraparenchymal or extra-axial mass or hemorrhage. Normal size and configuration of the ventricles and basilar cisterns. No midline. 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 The remaining paranasal sinuses mastoid air cells appear normally aerated. Regional soft tissues appear normal. No displaced calvarial fracture.  IMPRESSION: Negative noncontrast head CT.   Electronically Signed   By: [name] M.D.   On: 11/19/2013 12:58   Ct Angio Chest Pe W/cm &/or Wo Cm  11/19/2013   CLINICAL DATA:  Shortness of breath, chest pain.  EXAM: CT ANGIOGRAPHY CHEST WITH CONTRAST  TECHNIQUE: Multidetector CT imaging of the chest was performed using the standard protocol during bolus administration of intravenous contrast. Multiplanar CT image reconstructions including MIPs were  obtained to evaluate the vascular anatomy.  CONTRAST:  175mL OMNIPAQUE IOHEXOL 350 MG/ML SOLN  COMPARISON:  CT scan of Apr 14, 2013.  FINDINGS: No pleural effusion or pneumothorax is noted. No significant pulmonary parenchymal abnormality is noted. The thoracic aorta appears normal without evidence of dissection or aneurysm. Pulmonary arteries appear normal. There is no evidence of pulmonary embolus no significant mediastinal adenopathy is noted. Ill-defined low-density measuring 4 cm is seen in the posterior segment of the right hepatic lobe ; further evaluation with MRI is recommended. No osseous abnormality is noted in the chest  Review of the MIP images confirms the above findings.  IMPRESSION: No evidence of pulmonary  embolus. No acute pulmonary disease is noted. 4 cm ill-defined low density is noted in the posterior segment of the right hepatic lobe concerning for possible neoplasm ; further evaluation with MRI is recommended.   Electronically Signed   By: [name] M.D.   On: 11/19/2013 14:58    EKG Interpretation   None       MDM   1. URI (upper respiratory infection)   2. Headache    The pt will continue the antibiotic and prednisone and will follow up next week with her md    Maudry Diego, MD 11/19/13 1520

## 2013-11-19 NOTE — Discharge Instructions (Signed)
Follow up with your md next week for recheck.  Tell you family md about the finding in your liver and your md may order an mri

## 2013-11-24 ENCOUNTER — Other Ambulatory Visit: Payer: Self-pay

## 2013-11-25 ENCOUNTER — Encounter: Payer: Self-pay | Admitting: Emergency Medicine

## 2013-11-25 ENCOUNTER — Ambulatory Visit (INDEPENDENT_AMBULATORY_CARE_PROVIDER_SITE_OTHER): Payer: BC Managed Care – PPO | Admitting: Emergency Medicine

## 2013-11-25 ENCOUNTER — Inpatient Hospital Stay (HOSPITAL_COMMUNITY)
Admission: EM | Admit: 2013-11-25 | Discharge: 2013-11-30 | DRG: 872 | Disposition: A | Discharge status: Home / Self Care | Payer: BC Managed Care – PPO | Attending: Internal Medicine | Admitting: Internal Medicine

## 2013-11-25 ENCOUNTER — Emergency Department (HOSPITAL_COMMUNITY): Payer: BC Managed Care – PPO

## 2013-11-25 ENCOUNTER — Encounter (HOSPITAL_COMMUNITY): Payer: Self-pay | Admitting: Emergency Medicine

## 2013-11-25 VITALS — BP 98/62 | HR 118 | Temp 101.6°F | Resp 16 | Ht 67.0 in | Wt 164.0 lb

## 2013-11-25 DIAGNOSIS — E876 Hypokalemia: Secondary | ICD-10-CM | POA: Diagnosis present

## 2013-11-25 DIAGNOSIS — Z923 Personal history of irradiation: Secondary | ICD-10-CM

## 2013-11-25 DIAGNOSIS — K219 Gastro-esophageal reflux disease without esophagitis: Secondary | ICD-10-CM | POA: Diagnosis present

## 2013-11-25 DIAGNOSIS — K8689 Other specified diseases of pancreas: Secondary | ICD-10-CM | POA: Diagnosis present

## 2013-11-25 DIAGNOSIS — E872 Acidosis, unspecified: Secondary | ICD-10-CM

## 2013-11-25 DIAGNOSIS — A419 Sepsis, unspecified organism: Principal | ICD-10-CM | POA: Diagnosis present

## 2013-11-25 DIAGNOSIS — D638 Anemia in other chronic diseases classified elsewhere: Secondary | ICD-10-CM | POA: Diagnosis present

## 2013-11-25 DIAGNOSIS — Z79899 Other long term (current) drug therapy: Secondary | ICD-10-CM

## 2013-11-25 DIAGNOSIS — E039 Hypothyroidism, unspecified: Secondary | ICD-10-CM | POA: Diagnosis present

## 2013-11-25 DIAGNOSIS — J45909 Unspecified asthma, uncomplicated: Secondary | ICD-10-CM | POA: Diagnosis present

## 2013-11-25 DIAGNOSIS — Z87891 Personal history of nicotine dependence: Secondary | ICD-10-CM

## 2013-11-25 DIAGNOSIS — C240 Malignant neoplasm of extrahepatic bile duct: Secondary | ICD-10-CM | POA: Diagnosis present

## 2013-11-25 DIAGNOSIS — I10 Essential (primary) hypertension: Secondary | ICD-10-CM | POA: Diagnosis present

## 2013-11-25 DIAGNOSIS — Z8509 Personal history of malignant neoplasm of other digestive organs: Secondary | ICD-10-CM

## 2013-11-25 DIAGNOSIS — R509 Fever, unspecified: Secondary | ICD-10-CM

## 2013-11-25 DIAGNOSIS — E1165 Type 2 diabetes mellitus with hyperglycemia: Secondary | ICD-10-CM

## 2013-11-25 DIAGNOSIS — E119 Type 2 diabetes mellitus without complications: Secondary | ICD-10-CM | POA: Diagnosis present

## 2013-11-25 DIAGNOSIS — Z86010 Personal history of colonic polyps: Secondary | ICD-10-CM

## 2013-11-25 DIAGNOSIS — K769 Liver disease, unspecified: Secondary | ICD-10-CM | POA: Diagnosis present

## 2013-11-25 DIAGNOSIS — R918 Other nonspecific abnormal finding of lung field: Secondary | ICD-10-CM

## 2013-11-25 DIAGNOSIS — Z9884 Bariatric surgery status: Secondary | ICD-10-CM

## 2013-11-25 DIAGNOSIS — R5383 Other fatigue: Secondary | ICD-10-CM

## 2013-11-25 DIAGNOSIS — R6510 Systemic inflammatory response syndrome (SIRS) of non-infectious origin without acute organ dysfunction: Secondary | ICD-10-CM

## 2013-11-25 DIAGNOSIS — N179 Acute kidney failure, unspecified: Secondary | ICD-10-CM | POA: Diagnosis present

## 2013-11-25 DIAGNOSIS — R739 Hyperglycemia, unspecified: Secondary | ICD-10-CM

## 2013-11-25 DIAGNOSIS — Z820 Family history of epilepsy and other diseases of the nervous system: Secondary | ICD-10-CM

## 2013-11-25 DIAGNOSIS — E871 Hypo-osmolality and hyponatremia: Secondary | ICD-10-CM

## 2013-11-25 DIAGNOSIS — Z800 Family history of malignant neoplasm of digestive organs: Secondary | ICD-10-CM

## 2013-11-25 DIAGNOSIS — E782 Mixed hyperlipidemia: Secondary | ICD-10-CM | POA: Diagnosis present

## 2013-11-25 DIAGNOSIS — Z9221 Personal history of antineoplastic chemotherapy: Secondary | ICD-10-CM

## 2013-11-25 DIAGNOSIS — R5381 Other malaise: Secondary | ICD-10-CM

## 2013-11-25 DIAGNOSIS — E860 Dehydration: Secondary | ICD-10-CM | POA: Diagnosis present

## 2013-11-25 DIAGNOSIS — Z833 Family history of diabetes mellitus: Secondary | ICD-10-CM

## 2013-11-25 DIAGNOSIS — D72829 Elevated white blood cell count, unspecified: Secondary | ICD-10-CM | POA: Diagnosis present

## 2013-11-25 DIAGNOSIS — Z8249 Family history of ischemic heart disease and other diseases of the circulatory system: Secondary | ICD-10-CM

## 2013-11-25 DIAGNOSIS — C801 Malignant (primary) neoplasm, unspecified: Secondary | ICD-10-CM | POA: Diagnosis present

## 2013-11-25 LAB — CBC WITH DIFFERENTIAL/PLATELET
BASOS ABS: 0 10*3/uL (ref 0.0–0.1)
Basophils Relative: 0 % (ref 0–1)
EOS ABS: 0 10*3/uL (ref 0.0–0.7)
Eosinophils Relative: 0 % (ref 0–5)
HCT: 33.2 % — ABNORMAL LOW (ref 36.0–46.0)
HEMOGLOBIN: 11.1 g/dL — AB (ref 12.0–15.0)
Lymphocytes Relative: 1 % — ABNORMAL LOW (ref 12–46)
Lymphs Abs: 0.4 10*3/uL — ABNORMAL LOW (ref 0.7–4.0)
MCH: 29.3 pg (ref 26.0–34.0)
MCHC: 33.4 g/dL (ref 30.0–36.0)
MCV: 87.6 fL (ref 78.0–100.0)
MONO ABS: 2.6 10*3/uL — AB (ref 0.1–1.0)
Monocytes Relative: 6 % (ref 3–12)
NEUTROS PCT: 93 % — AB (ref 43–77)
Neutro Abs: 40 10*3/uL — ABNORMAL HIGH (ref 1.7–7.7)
PLATELETS: 257 10*3/uL (ref 150–400)
RBC: 3.79 MIL/uL — ABNORMAL LOW (ref 3.87–5.11)
RDW: 14.5 % (ref 11.5–15.5)
WBC MORPHOLOGY: INCREASED
WBC: 43 10*3/uL — ABNORMAL HIGH (ref 4.0–10.5)

## 2013-11-25 LAB — CBC
HCT: 33.5 % — ABNORMAL LOW (ref 36.0–46.0)
Hemoglobin: 11.2 g/dL — ABNORMAL LOW (ref 12.0–15.0)
MCH: 29.3 pg (ref 26.0–34.0)
MCHC: 33.4 g/dL (ref 30.0–36.0)
MCV: 87.7 fL (ref 78.0–100.0)
PLATELETS: 263 10*3/uL (ref 150–400)
RBC: 3.82 MIL/uL — AB (ref 3.87–5.11)
RDW: 14.5 % (ref 11.5–15.5)
WBC: 40.1 10*3/uL — AB (ref 4.0–10.5)

## 2013-11-25 LAB — COMPREHENSIVE METABOLIC PANEL
ALBUMIN: 2.8 g/dL — AB (ref 3.5–5.2)
ALK PHOS: 116 U/L (ref 39–117)
ALT: 42 U/L — AB (ref 0–35)
AST: 24 U/L (ref 0–37)
BUN: 13 mg/dL (ref 6–23)
CALCIUM: 8.7 mg/dL (ref 8.4–10.5)
CO2: 22 mEq/L (ref 19–32)
Chloride: 87 mEq/L — ABNORMAL LOW (ref 96–112)
Creatinine, Ser: 1.34 mg/dL — ABNORMAL HIGH (ref 0.50–1.10)
GFR calc Af Amer: 50 mL/min — ABNORMAL LOW (ref >90)
GFR calc non Af Amer: 43 mL/min — ABNORMAL LOW (ref >90)
Glucose, Bld: 498 mg/dL — ABNORMAL HIGH (ref 70–99)
POTASSIUM: 3.9 meq/L (ref 3.7–5.3)
SODIUM: 127 meq/L — AB (ref 137–147)
TOTAL PROTEIN: 6 g/dL (ref 6.0–8.3)
Total Bilirubin: 0.4 mg/dL (ref 0.3–1.2)

## 2013-11-25 LAB — URINALYSIS, ROUTINE W REFLEX MICROSCOPIC
BILIRUBIN URINE: NEGATIVE
KETONES UR: NEGATIVE mg/dL
Leukocytes, UA: NEGATIVE
Nitrite: NEGATIVE
PH: 5.5 (ref 5.0–8.0)
Protein, ur: NEGATIVE mg/dL
SPECIFIC GRAVITY, URINE: 1.01 (ref 1.005–1.030)
Urobilinogen, UA: 0.2 mg/dL (ref 0.0–1.0)

## 2013-11-25 LAB — GLUCOSE, CAPILLARY
Glucose-Capillary: 383 mg/dL — ABNORMAL HIGH (ref 70–99)
Glucose-Capillary: 300 mg/dL — ABNORMAL HIGH (ref 70–99)
Glucose-Capillary: 192 mg/dL — ABNORMAL HIGH (ref 70–99)

## 2013-11-25 LAB — URINE MICROSCOPIC-ADD ON

## 2013-11-25 LAB — BASIC METABOLIC PANEL
BUN: 12 mg/dL (ref 6–23)
CO2: 26 meq/L (ref 19–32)
CREATININE: 1.29 mg/dL — AB (ref 0.50–1.10)
Calcium: 8.4 mg/dL (ref 8.4–10.5)
Chloride: 96 mEq/L (ref 96–112)
GFR calc Af Amer: 52 mL/min — ABNORMAL LOW (ref >90)
GFR calc non Af Amer: 45 mL/min — ABNORMAL LOW (ref >90)
Glucose, Bld: 418 mg/dL — ABNORMAL HIGH (ref 70–99)
Potassium: 4 mEq/L (ref 3.7–5.3)
SODIUM: 134 meq/L — AB (ref 137–147)

## 2013-11-25 LAB — CG4 I-STAT (LACTIC ACID): Lactic Acid, Venous: 6.11 mmol/L — ABNORMAL HIGH (ref 0.5–2.2)

## 2013-11-25 MED ORDER — VANCOMYCIN HCL 10 G IV SOLR
1500.0000 mg | INTRAVENOUS | Status: AC
  Administered 2013-11-25: 1500 mg via INTRAVENOUS
  Filled 2013-11-25: qty 1500

## 2013-11-25 MED ORDER — SODIUM CHLORIDE 0.9 % IV SOLN
1000.0000 mL | INTRAVENOUS | Status: DC
  Administered 2013-11-25: 1000 mL via INTRAVENOUS

## 2013-11-25 MED ORDER — LEVOTHYROXINE SODIUM 200 MCG PO TABS
200.0000 ug | ORAL_TABLET | ORAL | Status: DC
  Administered 2013-11-26 – 2013-11-30 (×3): 200 ug via ORAL
  Filled 2013-11-25 (×4): qty 1

## 2013-11-25 MED ORDER — ACETAMINOPHEN 325 MG PO TABS
650.0000 mg | ORAL_TABLET | Freq: Once | ORAL | Status: AC
  Administered 2013-11-25: 650 mg via ORAL
  Filled 2013-11-25: qty 2

## 2013-11-25 MED ORDER — SODIUM CHLORIDE 0.9 % IV SOLN
1000.0000 mL | Freq: Once | INTRAVENOUS | Status: AC
  Administered 2013-11-25: 1000 mL via INTRAVENOUS

## 2013-11-25 MED ORDER — VANCOMYCIN HCL IN DEXTROSE 1-5 GM/200ML-% IV SOLN
1000.0000 mg | Freq: Once | INTRAVENOUS | Status: DC
  Filled 2013-11-25: qty 200

## 2013-11-25 MED ORDER — PIPERACILLIN-TAZOBACTAM 3.375 G IVPB 30 MIN
3.3750 g | Freq: Once | INTRAVENOUS | Status: AC
  Administered 2013-11-25: 3.375 g via INTRAVENOUS
  Filled 2013-11-25: qty 50

## 2013-11-25 MED ORDER — ALPRAZOLAM 1 MG PO TABS
1.0000 mg | ORAL_TABLET | Freq: Every evening | ORAL | Status: DC | PRN
  Administered 2013-11-26 – 2013-11-29 (×4): 1 mg via ORAL
  Filled 2013-11-25 (×4): qty 1
  Filled 2013-11-25: qty 2

## 2013-11-25 MED ORDER — VANCOMYCIN HCL IN DEXTROSE 750-5 MG/150ML-% IV SOLN
750.0000 mg | Freq: Two times a day (BID) | INTRAVENOUS | Status: DC
  Administered 2013-11-26: 750 mg via INTRAVENOUS
  Filled 2013-11-25 (×2): qty 150

## 2013-11-25 MED ORDER — LEVOTHYROXINE SODIUM 100 MCG PO TABS
100.0000 ug | ORAL_TABLET | ORAL | Status: DC
  Administered 2013-11-27 – 2013-11-29 (×2): 100 ug via ORAL
  Filled 2013-11-25 (×4): qty 1

## 2013-11-25 MED ORDER — LEVOTHYROXINE SODIUM 100 MCG PO TABS
100.0000 ug | ORAL_TABLET | Freq: Every day | ORAL | Status: DC
  Filled 2013-11-25: qty 2

## 2013-11-25 MED ORDER — PANTOPRAZOLE SODIUM 40 MG PO TBEC
80.0000 mg | DELAYED_RELEASE_TABLET | Freq: Two times a day (BID) | ORAL | Status: DC
  Administered 2013-11-25 – 2013-11-30 (×10): 80 mg via ORAL
  Filled 2013-11-25 (×11): qty 2

## 2013-11-25 MED ORDER — GADOBENATE DIMEGLUMINE 529 MG/ML IV SOLN
15.0000 mL | Freq: Once | INTRAVENOUS | Status: AC | PRN
Start: 2013-11-25 — End: 2013-11-25
  Administered 2013-11-25: 15 mL via INTRAVENOUS

## 2013-11-25 MED ORDER — SODIUM CHLORIDE 0.9 % IV SOLN
INTRAVENOUS | Status: DC
  Administered 2013-11-25: 3.2 [IU]/h via INTRAVENOUS
  Filled 2013-11-25: qty 1

## 2013-11-25 MED ORDER — PIPERACILLIN-TAZOBACTAM 3.375 G IVPB
3.3750 g | Freq: Three times a day (TID) | INTRAVENOUS | Status: DC
  Administered 2013-11-26 – 2013-11-30 (×14): 3.375 g via INTRAVENOUS
  Filled 2013-11-25 (×19): qty 50

## 2013-11-25 MED ORDER — PANCRELIPASE (LIP-PROT-AMYL) 12000-38000 UNITS PO CPEP
1.0000 | ORAL_CAPSULE | Freq: Three times a day (TID) | ORAL | Status: DC
  Administered 2013-11-26 – 2013-11-30 (×14): 1 via ORAL
  Filled 2013-11-25 (×21): qty 1

## 2013-11-25 NOTE — ED Notes (Signed)
Pt currently in radiology will complete orders when pt returns.

## 2013-11-25 NOTE — ED Notes (Signed)
Pt attempted to collect urine sample on arrival to room, however was no able to provide enough. Will try again later.

## 2013-11-25 NOTE — ED Notes (Signed)
Patient reports that she ws seen 10 days ago for disorientation and states she had a spot on her liver. Patient states she has had bile duct cancer. Patient states she went to her PCP for a follow up. Patient's PCP instructed her to come to the ED for further evaluation.

## 2013-11-25 NOTE — ED Notes (Signed)
Patient transported to MRI 

## 2013-11-25 NOTE — Progress Notes (Signed)
   Subjective:    Patient ID: Courtney Stevens, female    DOB: [birth year], 58 y.o.   MRN: [id number]  HPI Comments: 58 yo female with fever, weakness, chills, disorientation worsening over last couple of days. She notes recently finishing Levaquin and Prednisone with out relief. She was in ER 11/19/13 with Neg workup except questionable new liver mass.  Gastrophageal Reflux Associated symptoms include fatigue.  Diabetes Hypoglycemia symptoms include confusion. Associated symptoms include fatigue.  Fever       Review of Systems  Constitutional: Positive for fever, chills and fatigue.  Psychiatric/Behavioral: Positive for confusion.       Objective:   Physical Exam  Nursing note and vitals reviewed. Constitutional:  Appears weak  Cardiovascular: Regular rhythm and normal heart sounds.   Rapid rate  Pulmonary/Chest: Effort normal and breath sounds normal.  Neurological: She is alert.  Skin: Skin is warm. She is diaphoretic.  Psychiatric: Judgment normal.          Assessment & Plan:  Patient will be sent to ER for further evaluation of sepsis vs dehydration. Dr Boyce Medici office has been notified and should be consulted once patient arrives.

## 2013-11-25 NOTE — ED Provider Notes (Signed)
CSN: [id number]     Arrival date & time 11/25/13  1523 History  First MD Initiated Contact with Patient 11/25/13 1536     Chief Complaint  Patient presents with  . lethargic   . Fever   HPI Pt was following up with her doctor today for a prior er visit for fever.  Pt started having symptoms of fever, weakness and shaking again today.  She went to her doctors office as scheduled and they sent her to the ED.  She has not had any trouble with cough or diarrhea but she did have an episode of vomiting yesterday but they may have been related to eating quckly (h/o dumping syndrome).  She has history of bile duct cancer but in remission.  However, when she was seen in the ED last week she had a ct scan that showed potential mass on the liver.  Pt states she feels disoriented but is aware of her present location the time and the place.  No hallucinations.  Past Medical History  Diagnosis Date  . Cancer     bile duct ca  . GERD (gastroesophageal reflux disease)   . Diabetes mellitus   . Hypertension   . Hypothyroid   . Hyperlipidemia   . Asthma    Past Surgical History  Procedure Laterality Date  . Gastric bypass  2004  . Cholecystectomy  1992  . Bile duct resection  2009  . Appendectomy    . Tonsillectomy    . Abdominal hysterectomy  1990    BIL OOPHORECTOMY  . Tubal ligation    . Breast surgery      BIOPSY NEG   Family History  Problem Relation Age of Onset  . Colon cancer Mother   . Hypertension Mother   . Glaucoma Mother   . Diabetes Father   . Alzheimer's disease Father   . Heart attack Father   . Nephrolithiasis Father   . Emphysema Father     was a smoker  . Heart disease Father   . Nephrolithiasis Sister   . Heart attack Brother   . Nephrolithiasis Brother    History  Substance Use Topics  . Smoking status: Former Smoker -- 2.00 packs/day for 5 years    Types: Cigarettes    Quit date: 11/18/1977  . Smokeless tobacco: Never Used  . Alcohol Use: Yes     Comment:  2-3 glasses of wine once per month    OB History   Grav Para Term Preterm Abortions TAB SAB Ect Mult Living                 Review of Systems  Constitutional: Negative for fever and chills.  Neurological: Negative for weakness.  All other systems reviewed and are negative.    Allergies  Ace inhibitors; Tilade; Reglan; and Betadine  Home Medications   Current Outpatient Rx  Name  Route  Sig  Dispense  Refill  . ALPRAZolam (XANAX) 1 MG tablet   Oral   Take 1 mg by mouth at bedtime as needed. anxiety          . atenolol (TENORMIN) 25 MG tablet   Oral   Take 25 mg by mouth every evening.          . calcium citrate-vitamin D (CITRACAL+D) 315-200 MG-UNIT per tablet   Oral   Take 1 tablet by mouth 3 (three) times daily.         . cetirizine (ZYRTEC) 10 MG tablet  Oral   Take 10 mg by mouth every morning.           . furosemide (LASIX) 40 MG tablet   Oral   Take 40 mg by mouth 2 (two) times daily.         Marland Kitchen glyBURIDE (DIABETA) 5 MG tablet   Oral   Take 5 mg by mouth daily.          Marland Kitchen HYDROcodone-acetaminophen (NORCO/VICODIN) 5-325 MG per tablet   Oral   Take 1 tablet by mouth every 6 (six) hours as needed for moderate pain.   20 tablet   0   . Iron Combinations (IRON COMPLEX PO)   Oral   Take 1 tablet by mouth daily. Pt takes 65mg  iron          . levofloxacin (LEVAQUIN) 500 MG tablet   Oral   Take 1 tablet (500 mg total) by mouth daily.   10 tablet   0   . levothyroxine (SYNTHROID, LEVOTHROID) 200 MCG tablet   Oral   Take 100-200 mcg by mouth daily. Pt alternates between 1/2 to 1 tablet daily         . lipase/protease/amylase (CREON-10/PANCREASE) 12000 UNITS CPEP   Oral   Take 1 capsule by mouth 3 (three) times daily before meals.           . Magnesium 250 MG TABS   Oral   Take 1 tablet by mouth 4 (four) times daily.           . metFORMIN (GLUCOPHAGE) 500 MG tablet   Oral   Take 500 mg by mouth 3 (three) times daily with meals.  One tab at breakfast, one tab at lunch and two tabs for dinner         . Multiple Vitamins-Minerals (MULTIVITAMIN WITH MINERALS) tablet   Oral   Take 1 tablet by mouth as needed.          Marland Kitchen omeprazole (PRILOSEC) 40 MG capsule      TAKE 1 CAPSULE BY MOUTH TWICE DAILY FOR ACID REFLUX   62 capsule   6   . OVER THE COUNTER MEDICATION   Oral   Take 1 tablet by mouth 4 (four) times daily. Calcium,magnesium,zinc supplement          . potassium chloride SA (K-DUR,KLOR-CON) 20 MEQ tablet   Oral   Take 20 mEq by mouth 2 (two) times daily.          . predniSONE (DELTASONE) 20 MG tablet      1 tab 3 x day for 2 days, then 1 tab 2 x day for 2 days, then 1 tab 1 x day for 3 days   13 tablet   0   . Vitamin D, Ergocalciferol, (DRISDOL) 50000 UNITS CAPS   Oral   Take 50,000 Units by mouth every evening.           BP 110/69  Pulse 125  Temp(Src) 99.6 F (37.6 C) (Oral)  Resp 18  SpO2 98% Physical Exam  Nursing note and vitals reviewed. Constitutional: She is oriented to person, place, and time. She appears well-developed and well-nourished. No distress.  HENT:  Head: Normocephalic and atraumatic.  Right Ear: External ear normal.  Left Ear: External ear normal.  Eyes: Conjunctivae are normal. Right eye exhibits no discharge. Left eye exhibits no discharge. No scleral icterus.  Neck: Neck supple. No tracheal deviation present.  Cardiovascular: Regular rhythm and intact distal pulses.  Tachycardia present.  Pulmonary/Chest: Effort normal and breath sounds normal. No stridor. No respiratory distress. She has no wheezes. She has no rales.  Abdominal: Soft. Bowel sounds are normal. She exhibits no distension. There is tenderness (mild ruq). There is no rebound and no guarding.  Musculoskeletal: She exhibits no edema and no tenderness.  Neurological: She is alert and oriented to person, place, and time. She has normal strength. No sensory deficit. Cranial nerve deficit: no facial  droop, no slurred speech, EOMI. She exhibits normal muscle tone. She displays no seizure activity. Coordination normal.  Skin: Skin is warm and dry. No rash noted.  Psychiatric: She has a normal mood and affect.    ED Course  Procedures (including critical care time) Labs Review Labs Reviewed  CBC WITH DIFFERENTIAL - Abnormal; Notable for the following:    WBC 43.0 (*)    RBC 3.79 (*)    Hemoglobin 11.1 (*)    HCT 33.2 (*)    Neutrophils Relative % 93 (*)    Lymphocytes Relative 1 (*)    Neutro Abs 40.0 (*)    Lymphs Abs 0.4 (*)    Monocytes Absolute 2.6 (*)    All other components within normal limits  COMPREHENSIVE METABOLIC PANEL - Abnormal; Notable for the following:    Sodium 127 (*)    Chloride 87 (*)    Glucose, Bld 498 (*)    Creatinine, Ser 1.34 (*)    Albumin 2.8 (*)    ALT 42 (*)    GFR calc non Af Amer 43 (*)    GFR calc Af Amer 50 (*)    All other components within normal limits  URINALYSIS, ROUTINE W REFLEX MICROSCOPIC - Abnormal; Notable for the following:    Glucose, UA >1000 (*)    Hgb urine dipstick TRACE (*)    All other components within normal limits  CEA - Abnormal; Notable for the following:    CEA 14.4 (*)    All other components within normal limits  BASIC METABOLIC PANEL - Abnormal; Notable for the following:    Sodium 134 (*)    Glucose, Bld 418 (*)    Creatinine, Ser 1.29 (*)    GFR calc non Af Amer 45 (*)    GFR calc Af Amer 52 (*)    All other components within normal limits  CBC - Abnormal; Notable for the following:    WBC 40.1 (*)    RBC 3.82 (*)    Hemoglobin 11.2 (*)    HCT 33.5 (*)    All other components within normal limits  GLUCOSE, CAPILLARY - Abnormal; Notable for the following:    Glucose-Capillary 383 (*)    All other components within normal limits  GLUCOSE, CAPILLARY - Abnormal; Notable for the following:    Glucose-Capillary 300 (*)    All other components within normal limits  GLUCOSE, CAPILLARY - Abnormal; Notable  for the following:    Glucose-Capillary 192 (*)    All other components within normal limits  COMPREHENSIVE METABOLIC PANEL - Abnormal; Notable for the following:    Potassium 3.4 (*)    Glucose, Bld 212 (*)    Calcium 7.7 (*)    Total Protein 5.0 (*)    Albumin 2.1 (*)    GFR calc non Af Amer 59 (*)    GFR calc Af Amer 69 (*)    All other components within normal limits  CBC - Abnormal; Notable for the following:    WBC 26.8 (*)  RBC 3.24 (*)    Hemoglobin 9.5 (*)    HCT 28.4 (*)    All other components within normal limits  CANCER ANTIGEN 19-9 - Abnormal; Notable for the following:    CA 19-9 30.0 (*)    All other components within normal limits  GLUCOSE, CAPILLARY - Abnormal; Notable for the following:    Glucose-Capillary 166 (*)    All other components within normal limits  GLUCOSE, CAPILLARY - Abnormal; Notable for the following:    Glucose-Capillary 162 (*)    All other components within normal limits  GLUCOSE, CAPILLARY - Abnormal; Notable for the following:    Glucose-Capillary 147 (*)    All other components within normal limits  CBC - Abnormal; Notable for the following:    RBC 3.73 (*)    Hemoglobin 10.8 (*)    HCT 33.4 (*)    All other components within normal limits  BASIC METABOLIC PANEL - Abnormal; Notable for the following:    Sodium 136 (*)    Glucose, Bld 198 (*)    Calcium 8.2 (*)    GFR calc non Af Amer 55 (*)    GFR calc Af Amer 64 (*)    All other components within normal limits  GLUCOSE, CAPILLARY - Abnormal; Notable for the following:    Glucose-Capillary 181 (*)    All other components within normal limits  GLUCOSE, CAPILLARY - Abnormal; Notable for the following:    Glucose-Capillary 166 (*)    All other components within normal limits  GLUCOSE, CAPILLARY - Abnormal; Notable for the following:    Glucose-Capillary 132 (*)    All other components within normal limits  GLUCOSE, CAPILLARY - Abnormal; Notable for the following:     Glucose-Capillary 227 (*)    All other components within normal limits  GLUCOSE, CAPILLARY - Abnormal; Notable for the following:    Glucose-Capillary 102 (*)    All other components within normal limits  CBC - Abnormal; Notable for the following:    Hemoglobin 11.3 (*)    HCT 34.6 (*)    All other components within normal limits  BASIC METABOLIC PANEL - Abnormal; Notable for the following:    Potassium 3.6 (*)    Glucose, Bld 134 (*)    Calcium 8.2 (*)    GFR calc non Af Amer 62 (*)    GFR calc Af Amer 72 (*)    All other components within normal limits  GLUCOSE, CAPILLARY - Abnormal; Notable for the following:    Glucose-Capillary 307 (*)    All other components within normal limits  VANCOMYCIN, TROUGH - Abnormal; Notable for the following:    Vancomycin Tr 25.1 (*)    All other components within normal limits  GLUCOSE, CAPILLARY - Abnormal; Notable for the following:    Glucose-Capillary 206 (*)    All other components within normal limits  GLUCOSE, CAPILLARY - Abnormal; Notable for the following:    Glucose-Capillary 151 (*)    All other components within normal limits  CBC - Abnormal; Notable for the following:    RBC 3.79 (*)    Hemoglobin 10.7 (*)    HCT 33.5 (*)    All other components within normal limits  BASIC METABOLIC PANEL - Abnormal; Notable for the following:    Glucose, Bld 161 (*)    Calcium 8.3 (*)    GFR calc non Af Amer 66 (*)    GFR calc Af Amer 76 (*)    All other  components within normal limits  GLUCOSE, CAPILLARY - Abnormal; Notable for the following:    Glucose-Capillary 125 (*)    All other components within normal limits  GLUCOSE, CAPILLARY - Abnormal; Notable for the following:    Glucose-Capillary 168 (*)    All other components within normal limits  GLUCOSE, CAPILLARY - Abnormal; Notable for the following:    Glucose-Capillary 121 (*)    All other components within normal limits  GLUCOSE, CAPILLARY - Abnormal; Notable for the following:     Glucose-Capillary 177 (*)    All other components within normal limits  GLUCOSE, CAPILLARY - Abnormal; Notable for the following:    Glucose-Capillary 209 (*)    All other components within normal limits  GLUCOSE, CAPILLARY - Abnormal; Notable for the following:    Glucose-Capillary 209 (*)    All other components within normal limits  GLUCOSE, CAPILLARY - Abnormal; Notable for the following:    Glucose-Capillary 149 (*)    All other components within normal limits  CBC - Abnormal; Notable for the following:    RBC 3.73 (*)    Hemoglobin 10.7 (*)    HCT 32.7 (*)    All other components within normal limits  BASIC METABOLIC PANEL - Abnormal; Notable for the following:    Glucose, Bld 181 (*)    GFR calc non Af Amer 57 (*)    GFR calc Af Amer 66 (*)    All other components within normal limits  GLUCOSE, CAPILLARY - Abnormal; Notable for the following:    Glucose-Capillary 154 (*)    All other components within normal limits  CG4 I-STAT (LACTIC ACID) - Abnormal; Notable for the following:    Lactic Acid, Venous 6.11 (*)    All other components within normal limits  CULTURE, BLOOD (ROUTINE X 2)  CULTURE, BLOOD (ROUTINE X 2)  URINE CULTURE  MRSA PCR SCREENING  URINE MICROSCOPIC-ADD ON  INFLUENZA PANEL BY PCR (TYPE A & B, H1N1)  TSH  GLUCOSE, CAPILLARY   Imaging Review No results found.  EKG Interpretation    Date/Time:  Thursday November 25 2013 16:31:58 EST Ventricular Rate:  88 PR Interval:  128 QRS Duration: 92 QT Interval:  382 QTC Calculation: 462 R Axis:   55 Text Interpretation:  Sinus rhythm No significant change since last tracing Confirmed by Clydie Dillen  MD-J, Marcelle Hepner ([id number]) on 11/25/2013 4:42:12 PM           1607 Discussed case with Dr Lorelle Formosa.  Requests a CEA antigen and an MRI of the abdomen.  Those tests have been ordered. 1740 Elevated WBC.  Empiric abx ordered.  Fluid resuscitation.  Continue to monitor closely MDM   Pt presented to the ED with  recurrent fevers and potential recurrence of hepatobiliary CA. Sent from office for admission.  EMpiric abx started.  SIRs physiology with her tachycardia.  MRI revealed recurrent mass concerning for malignancy.  Less likely abscess.  Pt admitted to medical service for further evaluation.     Kathalene Frames, MD 11/30/13 [phone]

## 2013-11-25 NOTE — ED Notes (Signed)
6.11 CG4 reported to DR Tomi Bamberger

## 2013-11-25 NOTE — Progress Notes (Signed)
ANTIBIOTIC CONSULT NOTE - Initial Consult  Pharmacy Consult for Vancomycin, Zosyn Indication: Sepsis  Allergies  Allergen Reactions  . Ace Inhibitors Anaphylaxis  . Tilade [Nedocromil] Anaphylaxis  . Reglan [Metoclopramide]   . Betadine [Povidone Iodine] Rash    iching     Patient Measurements:     Vital Signs: Temp: 99.6 F (37.6 C) (01/08 1535) Temp src: Oral (01/08 1535) BP: 110/69 mmHg (01/08 1535) Pulse Rate: 125 (01/08 1535)  Labs:  Recent Labs  11/25/13 1625  HGB 11.1*  HCT 33.2*  PLT 257  CREATININE 1.34*    The CrCl is unknown because both a height and weight (above a minimum accepted value) are required for this calculation.   Medical History: Past Medical History  Diagnosis Date  . Cancer     bile duct ca  . GERD (gastroesophageal reflux disease)   . Diabetes mellitus   . Hypertension   . Hypothyroid   . Hyperlipidemia   . Asthma     Assessment: 76 yoF sent to [facility] from PCP for fever, weakness, chills, disorientation.  PMHx: Bile duct cancer in remission, recent CT scan with potential liver mass, GERD, DM, HTN, HLD, asthma, and hypothyroidism.  Recent course levaquin 12/30-1/8 for fever.  Pharmacy consulted to dose vancomycin and zosyn for sepsis.  PTA levaquin D1 IV antibiotics 1/8 >> Vancomycin >> 1/8 >> Zosyn >>  Tm24h: 101.6 WBC: Elevated at 43 Renal: SCr 1.34, CrCl 48 (N51)  Microbiology: 1/8: Urine cx: collected 1/8: Blood cx: collected  Goal of Therapy:  Vancomycin trough 15 - 20 mcg/ml Eradication of infection   Plan:  1. Vancomycin 1500mg  IV x 1, then 750mg  IV q 12 hours 2.  Zosyn extended infusion 3.375g IV q 4 hours 3.  F/u Vanc trough at Css, renal function, microbiology, clinical course  Ralene Bathe, PharmD, BCPS 11/25/2013, 6:03 PM  Pager: [phone]

## 2013-11-25 NOTE — H&P (Addendum)
[facility] History and Physical  Courtney Stevens:[id number] DOB: 1956-10-18 DOA: 11/25/2013  Referring physician: Dorie Rank, ER Physician PCP: [name], MD   Chief Complaint: Fever and weakness  HPI: Courtney Stevens is a 58 y.o. 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 At that time, patient was noted to have an elevated blood sugar of 343, baseline well controlled, mild AST of 2.8 and a white blood cell count of 26.2. At that time, she was also complaining of some upper respiratory symptoms including sinus drainage. Patient's symptoms persisted, specifically fever and mild shortness of breath, and when these labs came back several days later, she was advised to go to the emergency room on 1/2.   In the emergency room on 1/2, patient's white blood cell count had decreased down to 21 and her blood sugar was down to 215.  Workup including urinalysis, chest x-ray, chest CT-done for elevated d-dimer and head CT were all unremarkable. Patient was sent home and advised to continue Levaquin and prednisone taper. It was noted on her CT angiogram of her chest that she had an ill-defined low density measuring 4 cm lesion was noted in the posterior segment of the right hepatic lobe concerning for possible neoplasm.  Patient's symptoms of fever and lethargy persisted. She also felt slightly disoriented and weak she came back to the emergency room today, 1/8. Lab work today and noted a blood sugar of 498, an increase in her creatinine of 1.34, elevated anion gap of 18 and lactic acid level is 6.11. Also concerning with a white  blood cell count of 43 with 93% neutrophils and increased bandemia.  Because again no clear-cut source of infection was noted, she underwent MR of the abdomen as recommended by radiology for further evaluation of her liver. As compared to a CT done in April 2014, she was noted to have a new peripheral enhancing mass in the right hepatic lobe worrisome for local recurrence or metastatic disease from patient's bile duct adenocarcinoma of the liver abscess could have the same appearance. She is also noted to have some associated necrotic upper, adenopathy. Patient was started on IV fluids, IV insulin and after blood cultures done, broad-spectrum IV antibiotics because of concerns for early sepsis. Hospitalists were called for further evaluation and admission.   Review of Systems:  Patient seen in emergency room. She is feeling okay, fatigue. She denies any headaches, vision changes, dysphasia. There is no change in her appetite and if anything, she feels that she's been eating a lot because of the prednisone. She denies any chest pain or palpitations. She does feel some shortness of breath which had started approximately a month ago, especially with exertion. She denies any cough, wheeze, abdominal pain, hematuria, dysuria, constipation, diarrhea, weight gain or weight loss recently, focal extremity numbness or weakness or pain. Review systems otherwise negative.  Past Medical History  Diagnosis Date  . Cancer     bile duct ca  . GERD (gastroesophageal reflux disease)   . Diabetes mellitus   . Hypertension   . Hypothyroid   . Hyperlipidemia   . Asthma    Past Surgical History  Procedure Laterality  Date  . Gastric bypass  2004  . Cholecystectomy  1992  . Bile duct resection  2009  . Appendectomy    . Tonsillectomy    . Abdominal hysterectomy  1990    BIL OOPHORECTOMY  . Tubal ligation    . Breast surgery      BIOPSY NEG   Social History:  reports that she quit smoking about 36 years ago.  Her smoking use included Cigarettes. She has a 10 pack-year smoking history. She has never used smokeless tobacco. She reports that she drinks alcohol. She reports that she does not use illicit drugs. patient was at home with her husband. Prior to this, she was able to ambulate without any assistance  Allergies  Allergen Reactions  . Ace Inhibitors Anaphylaxis  . Tilade [Nedocromil] Anaphylaxis  . Reglan [Metoclopramide]   . Betadine [Povidone Iodine] Rash    iching     Family History  Problem Relation Age of Onset  . Colon cancer Mother   . Hypertension Mother   . Glaucoma Mother   . Diabetes Father   . Alzheimer's disease Father   . Heart attack Father   . Nephrolithiasis Father   . Emphysema Father     was a smoker  . Heart disease Father   . Nephrolithiasis Sister   . Heart attack Brother   . Nephrolithiasis Brother      Prior to Admission medications   Medication Sig Start Date End Date Taking? Authorizing Provider  ALPRAZolam Duanne Moron) 1 MG tablet Take 1 mg by mouth at bedtime as needed. anxiety   Yes Historical Provider, MD  calcium citrate-vitamin D (CITRACAL+D) 315-200 MG-UNIT per tablet Take 1 tablet by mouth 3 (three) times daily.   Yes Historical Provider, MD  cetirizine (ZYRTEC) 10 MG tablet Take 10 mg by mouth every morning.     Yes Historical Provider, MD  furosemide (LASIX) 40 MG tablet Take 40 mg by mouth 2 (two) times daily.   Yes Historical Provider, MD  glyBURIDE (DIABETA) 5 MG tablet Take 5 mg by mouth daily.    Yes Historical Provider, MD  HYDROcodone-acetaminophen (NORCO/VICODIN) 5-325 MG per tablet Take 1 tablet by mouth every 6 (six) hours as needed for moderate pain. 11/19/13  Yes Maudry Diego, MD  Iron Combinations (IRON COMPLEX PO) Take 1 tablet by mouth daily. Pt takes 65mg  iron    Yes Historical Provider, MD  levothyroxine (SYNTHROID, LEVOTHROID) 200 MCG tablet Take 100-200 mcg by mouth daily. Pt alternates between 1/2 to 1 tablet daily   Yes  Historical Provider, MD  lipase/protease/amylase (CREON-10/PANCREASE) 12000 UNITS CPEP Take 1 capsule by mouth 3 (three) times daily before meals.     Yes Historical Provider, MD  Magnesium 250 MG TABS Take 1 tablet by mouth 4 (four) times daily.     Yes Historical Provider, MD  metFORMIN (GLUCOPHAGE) 500 MG tablet Take 500 mg by mouth 3 (three) times daily with meals. One tab at breakfast, one tab at lunch and two tabs for dinner   Yes Historical Provider, MD  Multiple Vitamins-Minerals (MULTIVITAMIN WITH MINERALS) tablet Take 1 tablet by mouth as needed.    Yes Historical Provider, MD  omeprazole (PRILOSEC) 40 MG capsule Take 40 mg by mouth 2 (two) times daily.   Yes Historical Provider, MD  OVER THE COUNTER MEDICATION Take 1 tablet by mouth 4 (four) times daily. Calcium,magnesium,zinc supplement    Yes Historical Provider, MD  potassium chloride SA (K-DUR,KLOR-CON) 20 MEQ tablet Take 20 mEq  by mouth 2 (two) times daily.    Yes Historical Provider, MD  Vitamin D, Ergocalciferol, (DRISDOL) 50000 UNITS CAPS Take 50,000 Units by mouth every evening.    Yes Historical Provider, MD   Physical Exam: Filed Vitals:   11/25/13 1535  BP: 110/69  Pulse: 125  Temp: 99.6 F (37.6 C)  Resp: 18    BP 110/69  Pulse 125  Temp(Src) 99.6 F (37.6 C) (Oral)  Resp 18  Ht 5\' 7"  (1.702 m)  Wt 74.39 kg (164 lb)  BMI 25.68 kg/m2  SpO2 98%  General:  Appears calm and comfortable, mild distress secondary from fatigue Eyes: Sclera nonicteric, extraocular movements are intact HEENT: Normocephalic, atraumatic, mucous membranes are slightly dry Cardiovascular: Mild sinus tachycardia, no murmurs appreciated Lungs: Clear to auscultation bilaterally Abdomen: Soft, nontender, nondistended, positive bowel sounds Extremities Tremors, clubbing or cyanosis, trace edema-nonpitting Neurologic: No focal deficits, no loss of sensation Skin: No rashes, skin breaks, tears or lesions Psych: Patient is appropriate, no  evidence of psychoses           Labs on Admission:  Basic Metabolic Panel:  Recent Labs Lab 11/19/13 1236 11/25/13 1625  NA 136* 127*  K 4.4 3.9  CL 98 87*  CO2  --  22  GLUCOSE 215* 498*  BUN 18 13  CREATININE 1.10 1.34*  CALCIUM  --  8.7   Liver Function Tests:  Recent Labs Lab 11/25/13 1625  AST 24  ALT 42*  ALKPHOS 116  BILITOT 0.4  PROT 6.0  ALBUMIN 2.8*   No results found for this basename: LIPASE, AMYLASE,  in the last 168 hours No results found for this basename: AMMONIA,  in the last 168 hours CBC:  Recent Labs Lab 11/19/13 1220 11/19/13 1236 11/25/13 1625  WBC 21.1*  --  43.0*  NEUTROABS 19.4*  --  40.0*  HGB 11.3* 12.2 11.1*  HCT 34.0* 36.0 33.2*  MCV 89.2  --  87.6  PLT 299  --  257   Cardiac Enzymes: No results found for this basename: CKTOTAL, CKMB, CKMBINDEX, TROPONINI,  in the last 168 hours  BNP (last 3 results) No results found for this basename: PROBNP,  in the last 8760 hours CBG: No results found for this basename: GLUCAP,  in the last 168 hours  Radiological Exams on Admission: Dg Chest 2 View  11/25/2013   CLINICAL DATA:  Mental status change, fever and chills, history of biliary tract cancer  EXAM: CHEST  2 VIEW  COMPARISON:  CT scan of this thorax dated November 19, 2013 and chest x-ray of the same day.  FINDINGS: The lungs are adequately inflated. There is no focal infiltrate. There is mild symmetric prominence of the pulmonary interstitial markings bilaterally. There is no pleural effusion. The cardiopericardial silhouette is normal in size. The pulmonary vascularity is not engorged. The mediastinum is normal in width very there is no pleural effusion. The observed portions of the bony thorax exhibit no acute abnormalities. There are surgical clips in the left upper quadrant of the abdomen.  IMPRESSION: There is no evidence of pneumonia nor CHF nor other acute cardiopulmonary abnormality. Mild prominence of the pulmonary interstitial  markings bilaterally may be related to the patient's previous smoking history.   Electronically Signed   By: David  Martinique   On: 11/25/2013 16:18   Mr Abdomen W Wo Contrast  11/25/2013   CLINICAL DATA:  Abdominal pain with nausea, vomiting and fever. 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 Liver lesion on CT.  EXAM: MRI ABDOMEN WITHOUT AND WITH CONTRAST  TECHNIQUE: Multiplanar multisequence MR imaging of the abdomen was performed both before and after the administration of intravenous contrast.  CONTRAST:  64mL MULTIHANCE GADOBENATE DIMEGLUMINE 529 MG/ML IV SOLN  COMPARISON:  CT ABD/PELVIS W CM dated 02/19/2013; CT ABD/PELVIS W CM dated 08/14/2012; MR ABDOMEN WO/W CM dated 03/06/2010; CT ANGIO CHEST W/CM &/OR WO/CM dated 11/19/2013  FINDINGS: The previously demonstrated diffuse severe hepatic steatosis has improved. Medially within the right hepatic lobe at the site of the previous geographic sparing of steatosis is a new heterogeneous mass. This demonstrates peripheral heterogeneous enhancement following contrast, measuring approximately 5.6 x 3.7 cm transverse on image 23 of series 1201. There is inferior extension of tumor along the posteromedial hepatic capsule. There is extrinsic mass effect on the IVC which is narrowed, but patent. The hepatic and portal veins are patent. The dominant liver lesion demonstrates central cystic or necrotic components which do not enhance.  No other focal liver lesions are identified. There is stable mild intrahepatic biliary dilatation status post choledochojejunostomy. There is stable atrophy of the pancreatic body and tail. Soft tissue stranding and enhancement surrounding the duodenum and IVC are similar to the prior examination, likely postsurgical in etiology.  The spleen, adrenal glands and kidneys demonstrate no significant findings. There is no hydronephrosis. There is upper abdominal adenopathy within the portacaval space and retroperitoneum. There  is a 1.4 cm aortic caval node which demonstrates central necrosis. There is a new enhancing 11 mm nodule inferior to the right hepatic lobe which may reflect a small peritoneal implant. No significant ascites is seen.  IMPRESSION: 1. As demonstrated on recent Chest CT, there is a new peripherally enhancing mass posteromedially within the right hepatic lobe, highly worrisome for local recurrence or metastatic disease from the patient's bile duct adenocarcinoma. A liver abscess could have this appearance, although that possibility is considered much less likely. 2. Associated necrotic upper abdominal adenopathy, also worrisome for metastatic disease. Possible small peritoneal implant adjacent to the right hepatic lobe. 3. No significant biliary dilatation. 4. Extrinsic mass effect on the IVC without evidence of occlusion.   Electronically Signed   By: Camie Patience M.D.   On: 11/25/2013 18:37    EKG: Independently reviewed. Sinus tachycardia  Assessment/Plan Principal Problem:   SIRS (systemic inflammatory response syndrome): Unclear source. Started on broad-spectrum antibiotics because of worsening labs and elevated lactic acid level. Does not look to be respiratory or urine related. No signs of colitis. CDiff culture done last week negative. Awaiting flu PCR. Doubt this is liver abscess, however will check with infectious disease.  History of gallbladder cancer with new liver mass: Oncology notified and will followup with patient in morning. Have ordered see CEA & CA 19-9 levels, last one was checked in September  Active Problems:   PANCREATIC INSUFFICIENCY: Continue Pancrease enzymes. By mouth once sugars are down    Unspecified essential hypertension: Holding on Lasix for now, given suspected volume depletion from hyperglycemia    Mixed hyperlipidemia   Intrinsic asthma: Could be underlying cause of shortness of breath. Pulmonary embolus ruled out last week.   History of Roux-en-Y gastric bypass    Fever: Unclear etiology. We'll call infectious disease for assistance. Checking a flu PCR.    Metabolic acidosis: Actually do not think that this is DKA. Suspect this to be more lactic acidosis from early sirs. See above    Leukocytosis: Suspect some of white blood cell  count may be from worsening steroids. See above, treating as early sepsis.    Hypothyroidism: Continue Synthroid, checking TSH    Type 2 diabetes mellitus with hyperglycemia: Suspect sugars are worse with possible DKA? from steroids and also underlying infection. N.p.o. plus insulin drip. Continue to check basic metabolic panels.  Patient should be treated as a diabetic type I because of gastric bypass treated her diabetes type 2. Patient tells me that her diabetes return 5 years later went ahead to partially resect her pancreas from her gallbladder cancer. This is more of an insulin deficiency problem, although she is managed on oral medications with an excellent A1c. Addendum: Repeat basic metabolic panel notes resolution of acidosis, despite elevated blood sugar in the 400s. Patient's acidosis is likely more from lactic acidosis brought on by possible infection. In the meantime, continue insulin drip until sugars stabilized  Code Status: Full code  Family Communication: Husband at the bedside.  Disposition Plan: Place in step down unit given concerns for systemic infection and worsening labs, for close monitoring. Patient will likely be her for several days  Time spent: 45 minutes  [facility] Pager [phone]

## 2013-11-26 DIAGNOSIS — E860 Dehydration: Secondary | ICD-10-CM

## 2013-11-26 DIAGNOSIS — R509 Fever, unspecified: Secondary | ICD-10-CM

## 2013-11-26 DIAGNOSIS — Z800 Family history of malignant neoplasm of digestive organs: Secondary | ICD-10-CM

## 2013-11-26 DIAGNOSIS — C240 Malignant neoplasm of extrahepatic bile duct: Secondary | ICD-10-CM

## 2013-11-26 DIAGNOSIS — E119 Type 2 diabetes mellitus without complications: Secondary | ICD-10-CM

## 2013-11-26 DIAGNOSIS — D638 Anemia in other chronic diseases classified elsewhere: Secondary | ICD-10-CM

## 2013-11-26 LAB — COMPREHENSIVE METABOLIC PANEL
ALBUMIN: 2.1 g/dL — AB (ref 3.5–5.2)
ALT: 31 U/L (ref 0–35)
AST: 12 U/L (ref 0–37)
Alkaline Phosphatase: 86 U/L (ref 39–117)
BUN: 11 mg/dL (ref 6–23)
CALCIUM: 7.7 mg/dL — AB (ref 8.4–10.5)
CO2: 23 mEq/L (ref 19–32)
CREATININE: 1.02 mg/dL (ref 0.50–1.10)
Chloride: 105 mEq/L (ref 96–112)
GFR calc Af Amer: 69 mL/min — ABNORMAL LOW (ref >90)
GFR calc non Af Amer: 59 mL/min — ABNORMAL LOW (ref >90)
Glucose, Bld: 212 mg/dL — ABNORMAL HIGH (ref 70–99)
Potassium: 3.4 mEq/L — ABNORMAL LOW (ref 3.7–5.3)
Sodium: 139 mEq/L (ref 137–147)
TOTAL PROTEIN: 5 g/dL — AB (ref 6.0–8.3)
Total Bilirubin: 0.3 mg/dL (ref 0.3–1.2)

## 2013-11-26 LAB — INFLUENZA PANEL BY PCR (TYPE A & B)
H1N1 flu by pcr: NOT DETECTED
INFLAPCR: NEGATIVE
INFLBPCR: NEGATIVE

## 2013-11-26 LAB — GLUCOSE, CAPILLARY
GLUCOSE-CAPILLARY: 86 mg/dL (ref 70–99)
GLUCOSE-CAPILLARY: 162 mg/dL — AB (ref 70–99)
GLUCOSE-CAPILLARY: 147 mg/dL — AB (ref 70–99)
GLUCOSE-CAPILLARY: 181 mg/dL — AB (ref 70–99)
Glucose-Capillary: 166 mg/dL — ABNORMAL HIGH (ref 70–99)
Glucose-Capillary: 166 mg/dL — ABNORMAL HIGH (ref 70–99)

## 2013-11-26 LAB — CBC
HCT: 28.4 % — ABNORMAL LOW (ref 36.0–46.0)
HEMOGLOBIN: 9.5 g/dL — AB (ref 12.0–15.0)
MCH: 29.3 pg (ref 26.0–34.0)
MCHC: 33.5 g/dL (ref 30.0–36.0)
MCV: 87.7 fL (ref 78.0–100.0)
PLATELETS: 221 10*3/uL (ref 150–400)
RBC: 3.24 MIL/uL — AB (ref 3.87–5.11)
RDW: 14.7 % (ref 11.5–15.5)
WBC: 26.8 10*3/uL — AB (ref 4.0–10.5)

## 2013-11-26 LAB — CEA: CEA: 14.4 ng/mL — AB (ref 0.0–5.0)

## 2013-11-26 LAB — TSH: TSH: 1.503 u[IU]/mL (ref 0.350–4.500)

## 2013-11-26 LAB — CANCER ANTIGEN 19-9: CA 19-9: 30 U/mL — ABNORMAL LOW (ref <35.0)

## 2013-11-26 LAB — MRSA PCR SCREENING: MRSA by PCR: NEGATIVE

## 2013-11-26 MED ORDER — POTASSIUM CHLORIDE CRYS ER 20 MEQ PO TBCR
40.0000 meq | EXTENDED_RELEASE_TABLET | Freq: Once | ORAL | Status: AC
Start: 2013-11-26 — End: 2013-11-26
  Administered 2013-11-26: 40 meq via ORAL
  Filled 2013-11-26: qty 2

## 2013-11-26 MED ORDER — ENOXAPARIN SODIUM 40 MG/0.4ML ~~LOC~~ SOLN
40.0000 mg | SUBCUTANEOUS | Status: DC
  Administered 2013-11-26 – 2013-11-30 (×5): 40 mg via SUBCUTANEOUS
  Filled 2013-11-26 (×5): qty 0.4

## 2013-11-26 MED ORDER — ONDANSETRON HCL 4 MG PO TABS: 4.0000 mg | ORAL_TABLET | Freq: Four times a day (QID) | ORAL | Status: DC | PRN

## 2013-11-26 MED ORDER — INSULIN ASPART 100 UNIT/ML ~~LOC~~ SOLN
0.0000 [IU] | Freq: Every day | SUBCUTANEOUS | Status: DC
  Administered 2013-11-27: 4 [IU] via SUBCUTANEOUS

## 2013-11-26 MED ORDER — ONDANSETRON HCL 4 MG/2ML IJ SOLN: 4.0000 mg | Freq: Four times a day (QID) | INTRAMUSCULAR | Status: DC | PRN

## 2013-11-26 MED ORDER — SODIUM CHLORIDE 0.9 % IV SOLN: INTRAVENOUS | Status: DC

## 2013-11-26 MED ORDER — MORPHINE SULFATE 2 MG/ML IJ SOLN
1.0000 mg | INTRAMUSCULAR | Status: DC | PRN
  Administered 2013-11-27 – 2013-11-30 (×2): 1 mg via INTRAVENOUS
  Filled 2013-11-26 (×2): qty 1

## 2013-11-26 MED ORDER — INSULIN ASPART 100 UNIT/ML ~~LOC~~ SOLN
0.0000 [IU] | Freq: Three times a day (TID) | SUBCUTANEOUS | Status: DC
  Administered 2013-11-26 (×2): 3 [IU] via SUBCUTANEOUS
  Administered 2013-11-26: 2 [IU] via SUBCUTANEOUS
  Administered 2013-11-27: 5 [IU] via SUBCUTANEOUS
  Administered 2013-11-27: 2 [IU] via SUBCUTANEOUS
  Administered 2013-11-28: 3 [IU] via SUBCUTANEOUS
  Administered 2013-11-28: 2 [IU] via SUBCUTANEOUS
  Administered 2013-11-28 – 2013-11-29 (×2): 5 [IU] via SUBCUTANEOUS
  Administered 2013-11-29: 3 [IU] via SUBCUTANEOUS
  Administered 2013-11-29: 2 [IU] via SUBCUTANEOUS
  Administered 2013-11-30: 3 [IU] via SUBCUTANEOUS
  Administered 2013-11-30: 2 [IU] via SUBCUTANEOUS
  Filled 2013-11-26 (×2): qty 0.15

## 2013-11-26 MED ORDER — VANCOMYCIN HCL IN DEXTROSE 1-5 GM/200ML-% IV SOLN
1000.0000 mg | Freq: Two times a day (BID) | INTRAVENOUS | Status: DC
  Administered 2013-11-26 – 2013-11-28 (×4): 1000 mg via INTRAVENOUS
  Filled 2013-11-26 (×6): qty 200

## 2013-11-26 MED ORDER — SODIUM CHLORIDE 0.9 % IV SOLN
INTRAVENOUS | Status: DC
  Administered 2013-11-26: 03:00:00 via INTRAVENOUS
  Administered 2013-11-27: 1000 mL via INTRAVENOUS
  Administered 2013-11-28: 04:00:00 via INTRAVENOUS

## 2013-11-26 MED ORDER — SODIUM CHLORIDE 0.9 % IJ SOLN
3.0000 mL | Freq: Two times a day (BID) | INTRAMUSCULAR | Status: DC
  Administered 2013-11-26: 3 mL via INTRAVENOUS

## 2013-11-26 NOTE — Progress Notes (Signed)
Rechecked CBG 2 hrs s/p D/C Insulin gtt. CBG at 0400 = 166. Paged Hospitalist and discussed.  Will change diet from NPO to Carb Mod Med.  Discussed SB heart rate 46-54.  Per MD, cont to monitor and page with any changes.  Will cont to monitor pt status.  Petra Kuba RN

## 2013-11-26 NOTE — Progress Notes (Signed)
Patient ID: Courtney Stevens, female   DOB: March 07, 1956, 58 y.o.   MRN: [id number]  [facility] PROGRESS NOTE  Courtney Stevens FUX:[id number] DOB: 1956-10-23 DOA: 11/25/2013 PCP: [name], MD  Brief narrative: Pt is 58 y.o. female diabetes mellitus, hypertension and hypothyroidism, s/p gastric bypass surgery with Roux-in-Y procedure in 2004 and bile duct cancer s/p bile duct resection in 2009 with chemotherapy/radiation, who presented to [facility] ED with main concern of progressively worsening fatigue that initially started October 2014 and has been associated with progressive lethargy, poor oral intake, intermittent episodes of shortness of breath. In December 2014 (11/16/2013), she was treated with Levaquin and Prednisone for URI (by her PCP). She explains, she initially felt better but suddenly started to get worse with more lethargy, confusion, malaise. Her PCP referred her to ER for further evaluation.   In ED, lab work significant for elevated CR 1.34, Na 127, AG of 18, and WBC 43 K (last WBC 11/19/2013 = 21K), with no clear source of infection, MR of the abdomen ordered for further evaluation and results worrisome for local recurrence of the metastatic disease in liver. Patient was started on IV fluids, IV insulin and after blood cultures done, broad-spectrum IV antibiotics were started due to concerns for early sepsis. Hospitalists were called for further evaluation and admission.  On note, 11/20/2103, pt was in ED with similar symptoms and at that time and work up including urinalysis, chest x-ray, chest CT, and head CT were all unremarkable. Patient was sent home and advised to continue Levaquin and prednisone taper. It was noted on her CT angiogram of her chest that she had an ill-defined low density measuring 4 cm lesion in the posterior segment of the right hepatic lobe concerning for possible neoplasm.   Principal Problem:   SIRS (systemic inflammatory response syndrome) - unclear  etiology, work up in progress  - continue broad spectrum ABX Vancomycin and Zosyn day #2 - WBC trending down  - CXR and UA unremarkable - urine and blood culture pending  Active Problems:   Leukocytosis with fever  - management as noted above, WBC trending down   - pt afebrile over 24 hours - CBC in AM   Acute renal failure - secondary to pre renal etiology - IVF started and pt has responded well - Cr is WNL this AM - advance diet as pt able to tolerate    Hypokalemia - mild, will supplement and repeat BMP in AM   Elevated AG without metabolic acidoses  - secondary to pre renal etiology - pt started on IVF and has responded well - AG closed this AM   Hypothyroidism - continue synthroid    Type 2 diabetes mellitus with hyperglycemia - off insulin drip and clinically stable, feels better this AM  - continue SSI for now iuntil PO intake improves     HTN  - reasonable inpatient control    Pancreatic insufficieny - continue pancrease and PPI    Anemia of chronic disease - clinically stable, no signs of active bleed - CBC in AM, no indication for transfusion    Adenocarcinoma of distal CBD, 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 Mild prominence of the pulmonary interstitial markings bilaterally may be related to the patient's previous smoking history.   Mr Abdomen W Wo Contrast   11/25/2013   As demonstrated on recent Chest CT, there is a new peripherally enhancing mass posteromedially within the right hepatic lobe, highly worrisome for local recurrence or metastatic disease from the patient's bile duct adenocarcinoma. A liver abscess could have  this appearance, although that possibility is considered much less likely. Associated necrotic upper abdominal adenopathy, also worrisome for metastatic disease. Possible small peritoneal implant adjacent to the right hepatic lobe. No significant biliary dilatation. Extrinsic mass effect on the IVC without evidence of occlusion.    Antibiotics:  Vancomycin 01/08 -->  Zosyn 01/09 --.>   Code Status: Full Family Communication: Pt at bedside Disposition Plan: Home when medically stable  HPI/Subjective: No events overnight.   Objective: Filed Vitals:   11/26/13 0400 11/26/13 0500 11/26/13 0606 11/26/13 0800  BP:   128/54 139/74  Pulse: 59 52 57 63  Temp: 98.2 F (36.8 C)   98 F (36.7 C)  TempSrc:    Oral  Resp: 14 13 14 21   Height:      Weight:      SpO2: 99% 98% 98% 100%    Intake/Output Summary (Last 24 hours) at 11/26/13 0913 Last data filed at 11/26/13 0800  Gross per 24 hour  Intake 7366.67 ml  Output      0 ml  Net 7366.67 ml    Exam:   General:  Pt is alert, follows commands appropriately, not in acute distress  Cardiovascular: Regular rate and rhythm, S1/S2, no murmurs, no rubs, no gallops  Respiratory: Clear to auscultation bilaterally, no wheezing, no crackles, no rhonchi  Abdomen: Soft, non tender, non distended, bowel sounds present, no guarding  Extremities: No edema, pulses DP and PT palpable bilaterally  Neuro: Grossly nonfocal  Data Reviewed: Basic Metabolic Panel:  Recent Labs Lab 11/19/13 1236 11/25/13 1625 11/25/13 1945 11/26/13 0454  NA 136* 127* 134* 139  K 4.4 3.9 4.0 3.4*  CL 98 87* 96 105  CO2  --  22 26 23   GLUCOSE 215* 498* 418* 212*  BUN 18 13 12 11   CREATININE 1.10 1.34* 1.29* 1.02  CALCIUM  --  8.7 8.4 7.7*   Liver Function Tests:  Recent Labs Lab 11/25/13 1625 11/26/13 0454  AST 24 12  ALT 42* 31  ALKPHOS 116 86  BILITOT 0.4 0.3  PROT 6.0 5.0*  ALBUMIN 2.8* 2.1*   CBC:  Recent Labs Lab 11/19/13 1220  11/19/13 1236 11/25/13 1625 11/25/13 1945 11/26/13 0454  WBC 21.1*  --  43.0* 40.1* 26.8*  NEUTROABS 19.4*  --  40.0*  --   --   HGB 11.3* 12.2 11.1* 11.2* 9.5*  HCT 34.0* 36.0 33.2* 33.5* 28.4*  MCV 89.2  --  87.6 87.7 87.7  PLT 299  --  257 263 221   CBG:  Recent Labs Lab 11/25/13 2125 11/25/13 2233 11/26/13 0140 11/26/13 0402 11/26/13 0756  GLUCAP 300* 192* 86 166* 162*    Recent Results (from the past 240 hour(s))  URINE CULTURE     Status: None   Collection Time    11/16/13  3:11 PM      Result Value Range Status   Colony Count NO GROWTH   Final   Organism ID, Bacteria NO GROWTH   Final  CULTURE, BLOOD (ROUTINE X 2)     Status: None  Collection Time    11/25/13  4:25 PM      Result Value Range Status   Specimen Description BLOOD RIGHT ARM   Final   Special Requests BOTTLES DRAWN AEROBIC AND ANAEROBIC 5CC   Final   Culture  Setup Time     Final   Value: 11/25/2013 21:04     Performed at [facility]   Culture     Final   Value:        BLOOD CULTURE RECEIVED NO GROWTH TO DATE CULTURE WILL BE HELD FOR 5 DAYS BEFORE ISSUING A FINAL NEGATIVE REPORT     Performed at [facility]   Report Status PENDING   Incomplete  CULTURE, BLOOD (ROUTINE X 2)     Status: None   Collection Time    11/25/13  4:30 PM      Result Value Range Status   Specimen Description BLOOD LEFT FOREARM   Final   Special Requests BOTTLES DRAWN AEROBIC AND ANAEROBIC 5CC   Final   Culture  Setup Time     Final   Value: 11/25/2013 21:02     Performed at [facility]   Culture     Final   Value:        BLOOD CULTURE RECEIVED NO GROWTH TO DATE CULTURE WILL BE HELD FOR 5 DAYS BEFORE ISSUING A FINAL NEGATIVE REPORT     Performed at [facility]   Report Status PENDING   Incomplete  MRSA PCR SCREENING     Status: None   Collection Time    11/26/13  3:17 AM      Result Value Range Status   MRSA by PCR NEGATIVE  NEGATIVE Final   Comment:            The GeneXpert  MRSA Assay (FDA     approved for NASAL specimens     only), is one component of a     comprehensive MRSA colonization     surveillance program. It is not     intended to diagnose MRSA     infection nor to guide or     monitor treatment for     MRSA infections.     Scheduled Meds: . enoxaparin injection  40 mg Subcutaneous Q24H  . insulin aspart  0-15 Units Subcutaneous TID WC  . insulin aspart  0-5 Units Subcutaneous QHS  . levothyroxine  200 mcg Oral Custom  . lipase/protease/amylase  1 capsule Oral TID AC  . pantoprazole  80 mg Oral BID  . ZOSYN  IV  3.375 g Intravenous Q8H  . vancomycin  750 mg Intravenous Q12H   Continuous Infusions: . sodium chloride 125 mL/hr at 11/26/13 0324     Faye Ramsay, MD  [facility] Pager [phone]  If 7PM-7AM, please contact night-coverage [email or web address] Password [email or web address] 11/26/2013, 9:13 AM   LOS: 1 day

## 2013-11-26 NOTE — ED Notes (Signed)
Spoke with [facility] at this time for pt's CG of 86, stated to stop insulin drip and recheck CBG in 2 hours and will put in a diet for pt

## 2013-11-26 NOTE — Progress Notes (Signed)
Courtney Stevens   DOB:16-Dec-1955   [id number]   [id number]  Subjective: In droplets isolation in the LW-ICU Stepdown unit.  She is off her insulin gtt.  She tolerated breakfast without difficulty.  She denies pain.  Her husband is at her bedside.   Objective:  Filed Vitals:   11/26/13 1000  BP: 129/68  Pulse: 62  Temp:   Resp: 20    Body mass index is 25.84 kg/(m^2).  Intake/Output Summary (Last 24 hours) at 11/26/13 1201 Last data filed at 11/26/13 1100  Gross per 24 hour  Intake 8241.67 ml  Output      0 ml  Net 8241.67 ml    Laying in bed; chronically ill appearing. Slightly overweight  Sclerae unicteric  Oropharynx clear  No peripheral adenopathy  Lungs clear -- no rales or rhonchi  Heart regular rate and rhythm  Abdomen benign; + BS no organomegaly appreciated  MSK no peripheral edema  Neuro nonfocal   CBG (last 3)   Recent Labs  11/26/13 0140 11/26/13 0402 11/26/13 0756  GLUCAP 86 166* 162*     Labs:  Lab Results  Component Value Date   WBC 26.8* 11/26/2013   HGB 9.5* 11/26/2013   HCT 28.4* 11/26/2013   MCV 87.7 11/26/2013   PLT 221 11/26/2013   NEUTROABS 40.0* [id number]   Basic Metabolic Panel:  Recent Labs Lab 11/19/13 1236 11/25/13 1625 11/25/13 1945 11/26/13 0454  NA 136* 127* 134* 139  K 4.4 3.9 4.0 3.4*  CL 98 87* 96 105  CO2  --  22 26 23   GLUCOSE 215* 498* 418* 212*  BUN 18 13 12 11   CREATININE 1.10 1.34* 1.29* 1.02  CALCIUM  --  8.7 8.4 7.7*   GFR Estimated Creatinine Clearance: 63.5 ml/min (by C-G formula based on Cr of 1.02). Liver Function Tests:  Recent Labs Lab 11/25/13 1625 11/26/13 0454  AST 24 12  ALT 42* 31  ALKPHOS 116 86  BILITOT 0.4 0.3  PROT 6.0 5.0*  ALBUMIN 2.8* 2.1*   CBC:  Recent Labs Lab 11/19/13 1220 11/19/13 1236 11/25/13 1625 11/25/13 1945 11/26/13 0454  WBC 21.1*  --  43.0* 40.1* 26.8*  NEUTROABS 19.4*  --  40.0*  --   --   HGB 11.3* 12.2 11.1* 11.2* 9.5*  HCT 34.0* 36.0 33.2* 33.5* 28.4*   MCV 89.2  --  87.6 87.7 87.7  PLT 299  --  257 263 221   CBG:  Recent Labs Lab 11/25/13 2125 11/25/13 2233 11/26/13 0140 11/26/13 0402 11/26/13 0756  GLUCAP 300* 192* 86 166* 162*   Thyroid function studies  Recent Labs  11/25/13 1945  TSH 1.503   Microbiology Recent Results (from the past 240 hour(s))  URINE CULTURE     Status: None   Collection Time    11/16/13  3:11 PM      Result Value Range Status   Colony Count NO GROWTH   Final   Organism ID, Bacteria NO GROWTH   Final  CULTURE, BLOOD (ROUTINE X 2)     Status: None   Collection Time    11/25/13  4:25 PM      Result Value Range Status   Specimen Description BLOOD RIGHT ARM   Final   Special Requests BOTTLES DRAWN AEROBIC AND ANAEROBIC 5CC   Final   Culture  Setup Time     Final   Value: 11/25/2013 21:04     Performed at [facility]  Final   Value:        BLOOD CULTURE RECEIVED NO GROWTH TO DATE CULTURE WILL BE HELD FOR 5 DAYS BEFORE ISSUING A FINAL NEGATIVE REPORT     Performed at [facility]   Report Status PENDING   Incomplete  CULTURE, BLOOD (ROUTINE X 2)     Status: None   Collection Time    11/25/13  4:30 PM      Result Value Range Status   Specimen Description BLOOD LEFT FOREARM   Final   Special Requests BOTTLES DRAWN AEROBIC AND ANAEROBIC 5CC   Final   Culture  Setup Time     Final   Value: 11/25/2013 21:02     Performed at [facility]   Culture     Final   Value:        BLOOD CULTURE RECEIVED NO GROWTH TO DATE CULTURE WILL BE HELD FOR 5 DAYS BEFORE ISSUING A FINAL NEGATIVE REPORT     Performed at [facility]   Report Status PENDING   Incomplete  MRSA PCR SCREENING     Status: None   Collection Time    11/26/13  3:17 AM      Result Value Range Status   MRSA by PCR NEGATIVE  NEGATIVE Final   Comment:            The GeneXpert MRSA Assay (FDA     approved for NASAL specimens     only), is one component of a     comprehensive MRSA colonization      surveillance program. It is not     intended to diagnose MRSA     infection nor to guide or     monitor treatment for     MRSA infections.    Results for HANNI, ALOIA (MRN [id number]) as of 11/26/2013 11:58  Ref. Range 03/04/2010 05:00  CA 125 Latest Range: 0.0-30.2 U/mL 368.9 (H)   Results for OTELIA, CHLEBOWSKI (MRN [id number]) as of 11/26/2013 11:58  Ref. Range 08/14/2012 07:56 01/11/2013 10:23 02/25/2013 09:38 08/31/2013 10:32 11/25/2013 16:25  CA 19-9 Latest Range: <35.0 U/mL 30.4 83.7 (H) 28.3 28.7   CEA Latest Range: 0.0-5.0 ng/mL 12.8 (H) 10.7 (H) 14.3 (H) 12.0 (H) 14.4 (H)    Studies:   RADIOGRAPHIC STUDIES: Personally reviewed by me.  IMPRESSION:  CT CHEST (Apr 14, 2013)  1. The previously noted 1-3 mm pulmonary nodules have either resolved, decreased in size or are stable compared to the prior examination. Given the lack of a clearly definable craniocaudal gradient, and the behavior these nodules, these favored to reflect benign disease (particularly if there has been no interval chemotherapy or other intervention), but are nonspecific. Continued attention on future follow up studies is suggested.  2. Atherosclerosis, including three-vessel coronary artery disease. Please note that although the presence of coronary artery calcium documents the presence of coronary artery disease, the severity of this disease and any potential stenosis cannot be assessed on this non-gated CT examination. Assessment for  potential risk factor modification, dietary therapy or pharmacologic therapy may be warranted, if clinically indicated.  3. Additional incidental findings, as above.  Dg Chest 2 View  11/25/2013   CLINICAL DATA:  Mental status change, fever and chills, history of biliary tract cancer  EXAM: CHEST  2 VIEW  COMPARISON:  CT scan of this thorax dated November 19, 2013 and chest x-ray of the same day.  FINDINGS: The lungs are adequately inflated. There is no  focal infiltrate. There is mild  symmetric prominence of the pulmonary interstitial markings bilaterally. There is no pleural effusion. The cardiopericardial silhouette is normal in size. The pulmonary vascularity is not engorged. The mediastinum is normal in width very there is no pleural effusion. The observed portions of the bony thorax exhibit no acute abnormalities. There are surgical clips in the left upper quadrant of the abdomen.  IMPRESSION: There is no evidence of pneumonia nor CHF nor other acute cardiopulmonary abnormality. Mild prominence of the pulmonary interstitial markings bilaterally may be related to the patient's previous smoking history.   Electronically Signed   By: Gerado Nabers  [place]   On: 11/25/2013 16:18   Mr Abdomen W Wo Contrast  11/25/2013   CLINICAL DATA:  Abdominal pain with nausea, vomiting and fever. 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 Liver lesion on CT.  EXAM: MRI ABDOMEN WITHOUT AND WITH CONTRAST  TECHNIQUE: Multiplanar multisequence MR imaging of the abdomen was performed both before and after the administration of intravenous contrast.  CONTRAST:  5mL MULTIHANCE GADOBENATE DIMEGLUMINE 529 MG/ML IV SOLN  COMPARISON:  CT ABD/PELVIS W CM dated 02/19/2013; CT ABD/PELVIS W CM dated 08/14/2012; MR ABDOMEN WO/W CM dated 03/06/2010; CT ANGIO CHEST W/CM &/OR WO/CM dated 11/19/2013  FINDINGS: The previously demonstrated diffuse severe hepatic steatosis has improved. Medially within the right hepatic lobe at the site of the previous geographic sparing of steatosis is a new heterogeneous mass. This demonstrates peripheral heterogeneous enhancement following contrast, measuring approximately 5.6 x 3.7 cm transverse on image 23 of series 1201. There is inferior extension of tumor along the posteromedial hepatic capsule. There is extrinsic mass effect on the IVC which is narrowed, but patent. The hepatic and portal veins are patent. The dominant liver lesion demonstrates central cystic or necrotic  components which do not enhance.  No other focal liver lesions are identified. There is stable mild intrahepatic biliary dilatation status post choledochojejunostomy. There is stable atrophy of the pancreatic body and tail. Soft tissue stranding and enhancement surrounding the duodenum and IVC are similar to the prior examination, likely postsurgical in etiology.  The spleen, adrenal glands and kidneys demonstrate no significant findings. There is no hydronephrosis. There is upper abdominal adenopathy within the portacaval space and retroperitoneum. There is a 1.4 cm aortic caval node which demonstrates central necrosis. There is a new enhancing 11 mm nodule inferior to the right hepatic lobe which may reflect a small peritoneal implant. No significant ascites is seen.  IMPRESSION: 1. As demonstrated on recent Chest CT, there is a new peripherally enhancing mass posteromedially within the right hepatic lobe, highly worrisome for local recurrence or metastatic disease from the patient's bile duct adenocarcinoma. A liver abscess could have this appearance, although that possibility is considered much less likely. 2. Associated necrotic upper abdominal adenopathy, also worrisome for metastatic disease. Possible small peritoneal implant adjacent to the right hepatic lobe. 3. No significant biliary dilatation. 4. Extrinsic mass effect on the IVC without evidence of occlusion.   Electronically Signed   By: Camie Patience M.D.   On: 11/25/2013 18:37    Assessment: 58 y.o.  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 We are consulted for recommendations on further oncology workup.   1. SIRS/Hyperglycemia/Dehydration.  --Management per primary team.  --Hydration, antibiotics (vancomycin/zosyn) and insulin.  Blood cultures are pending.  UA negative.  CXR negative.   --  Leukocytosis can  in part due to steroid's effect as she was on prednisone which may also in part contribute to her hyperglycemia.   2. 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 Birdie Sons at [facility] center on 02/22/2008.  She underwent a  radical resection for node-positive adenocarcinoma of the bile duct with positive lymph node involvement (2 out of 17 lymph nodes) with positive surgical margins. She is status post radiation with continuous infusion 5-FU from 04/01/2008 through 05/24/2008. She then received 3 cycles of gemcitabine from 05/19/2008 to 12/01/2008. She was re-treated with gemcitabine from 02/17/2009 through 05/25/2009 due to increasing tumor markers. She was switched to single-agent Taxotere and received 3 cycles from 06/15/2009 through 07/27/2009. She reports her last colonoscopy was less than one year ago with one tubulus villous polyp removed and she has to follow up for repeat colonoscopy in 3 years.   --We discussed the results of her imaging including her MRI of abdomen indicating a new peripherally enhancing mass posteromedially within the right hepatic lobe, highly worrisome for local recurrence or metastatic disease from the patient's bile duct adenocarcinoma.    Liver abscess is felt to be less likely.  Of note, there also was associated necrotic upper abdominal adenopathy worrisome for metastatic disease.    -She should recover from #1.   No role for chemotherapy doing her acute illness.  We will schedule close follow up.   Tumor markers are as noted above.  Chemotherapy options include gemcitabine/xisplatin combination therapy or clinical trial or best supportive care.  Given above MRI of abdomen, may require a liver biopsy and a surgical consultation.   3. DM2.  --As noted above.   4. Normocytic anemia likely Anemia of chronic disease. --She is  asymptomatic for anemia presently.  Transfuse prn symptoms.  5. Disposition.  --Full Code.   We will follow this patient.    Hodges Treiber, MD 11/26/2013  12:01 PM

## 2013-11-26 NOTE — ED Notes (Signed)
Lab called for flu PCR

## 2013-11-26 NOTE — Progress Notes (Signed)
ANTIBIOTIC CONSULT NOTE - Follow Up Consult  Pharmacy Consult for Vancomycin, Zosyn Indication: Sepsis  Allergies  Allergen Reactions  . Ace Inhibitors Anaphylaxis  . Tilade [Nedocromil] Anaphylaxis  . Reglan [Metoclopramide]   . Betadine [Povidone Iodine] Rash    iching     Patient Measurements: Height: 5\' 7"  (170.2 cm) Weight: 165 lb (74.844 kg) IBW/kg (Calculated) : 61.6   Vital Signs: Temp: 98.1 F (36.7 C) (01/09 1200) Temp src: Oral (01/09 1200) BP: 116/61 mmHg (01/09 1400) Pulse Rate: 64 (01/09 1400)  Labs:  Recent Labs  11/25/13 1625 11/25/13 1945 11/26/13 0454  HGB 11.1* 11.2* 9.5*  HCT 33.2* 33.5* 28.4*  PLT 257 263 221  CREATININE 1.34* 1.29* 1.02    Estimated Creatinine Clearance: 63.5 ml/min (by C-G formula based on Cr of 1.02).   Medical History: Past Medical History  Diagnosis Date  . Cancer     bile duct ca  . GERD (gastroesophageal reflux disease)   . Diabetes mellitus   . Hypertension   . Hypothyroid   . Hyperlipidemia   . Asthma     Assessment: 36 yoF sent to [facility] from PCP for fever, weakness, chills, disorientation.  PMHx: Bile duct cancer in remission, recent CT scan with potential liver mass, GERD, DM, HTN, HLD, asthma, and hypothyroidism.  Recent course levaquin 12/30-1/8 for fever.  Pharmacy consulted to dose vancomycin and zosyn for sepsis.  PTA levaquin D1 IV antibiotics 1/8 >> Vancomycin >> 1/8 >> Zosyn >>  Tm24h: 101.6 yesterday, currently afebrile WBC: 43 > 26.8 (improving) Renal: SCr 1.34 > 1.02, CrCl 68 (L79)  Microbiology: 1/8: Urine cx: collected 1/8: Blood cx: no growth to date  Goal of Therapy:  Vancomycin trough 15 - 20 mcg/ml Eradication of infection   Plan:  1.  Adjust Vancomycin to 1g IV q 12 hours in response to improved SCr 2.  Continue Zosyn extended infusion 3.375g IV q 4 hours 3.  F/u Vanc trough at [facility], renal function, microbiology, clinical course  Hershal Coria, PharmD, BCPS Pager:  [phone] 11/26/2013 2:16 PM

## 2013-11-26 NOTE — Progress Notes (Signed)
CARE MANAGEMENT NOTE 11/26/2013  Patient:  Courtney Stevens, Courtney Stevens   Account Number:  [id number]  Date Initiated:  11/26/2013  Documentation initiated by:  DAVIS,RHONDA  Subjective/Objective Assessment:   pt with hyperglycemia and sepsis short term iv insulin drip dcd due to hypoglycemia, elevated wbc and temp.  history of recent sinsus infection and poss flu     Action/Plan:   home when stable   Anticipated DC Date:  11/29/2013   Anticipated DC Plan:  HOME/SELF CARE  In-house referral  NA      DC Planning Services  NA      [facility] Choice  NA   Choice offered to / List presented to:  NA   DME arranged  NA      DME agency  NA     [place] arranged  NA      [place] agency  NA   Status of service:  In process, will continue to follow Medicare Important Message given?  NA - LOS <3 / Initial given by admissions (If response is "NO", the following Medicare IM given date fields will be blank) Date Medicare IM given:   Date Additional Medicare IM given:    Discharge Disposition:    Per UR Regulation:  Reviewed for med. necessity/level of care/duration of stay  If discussed at [place] of Stay Meetings, dates discussed:    Comments:  [id number]/Rhonda Eldridge Dace, BSN, [place] [phone] Chart Reviewed for discharge and hospital needs. Discharge needs at time of review:  None present will follow for needs. Review of patient progress due on [id number].

## 2013-11-26 NOTE — Progress Notes (Signed)
  Echocardiogram 2D Echocardiogram has been performed.  Courtney Stevens 11/26/2013, 1:19 PM

## 2013-11-27 LAB — GLUCOSE, CAPILLARY
Glucose-Capillary: 132 mg/dL — ABNORMAL HIGH (ref 70–99)
Glucose-Capillary: 227 mg/dL — ABNORMAL HIGH (ref 70–99)
Glucose-Capillary: 102 mg/dL — ABNORMAL HIGH (ref 70–99)
Glucose-Capillary: 307 mg/dL — ABNORMAL HIGH (ref 70–99)

## 2013-11-27 LAB — BASIC METABOLIC PANEL
BUN: 9 mg/dL (ref 6–23)
CHLORIDE: 103 meq/L (ref 96–112)
CO2: 22 mEq/L (ref 19–32)
CREATININE: 1.08 mg/dL (ref 0.50–1.10)
Calcium: 8.2 mg/dL — ABNORMAL LOW (ref 8.4–10.5)
GFR, EST AFRICAN AMERICAN: 64 mL/min — AB (ref >90)
GFR, EST NON AFRICAN AMERICAN: 55 mL/min — AB (ref >90)
Glucose, Bld: 198 mg/dL — ABNORMAL HIGH (ref 70–99)
POTASSIUM: 4 meq/L (ref 3.7–5.3)
Sodium: 136 mEq/L — ABNORMAL LOW (ref 137–147)

## 2013-11-27 LAB — CBC
HEMATOCRIT: 33.4 % — AB (ref 36.0–46.0)
Hemoglobin: 10.8 g/dL — ABNORMAL LOW (ref 12.0–15.0)
MCH: 29 pg (ref 26.0–34.0)
MCHC: 32.3 g/dL (ref 30.0–36.0)
MCV: 89.5 fL (ref 78.0–100.0)
Platelets: 216 10*3/uL (ref 150–400)
RBC: 3.73 MIL/uL — ABNORMAL LOW (ref 3.87–5.11)
RDW: 14.9 % (ref 11.5–15.5)
WBC: 10 10*3/uL (ref 4.0–10.5)

## 2013-11-27 NOTE — Progress Notes (Signed)
Patient ID: Courtney Stevens, female   DOB: [birth year], 58 y.o.   MRN: [id number]  [facility] PROGRESS NOTE  Courtney Stevens IDP:[id number] DOB: 1956-08-19 DOA: 11/25/2013 PCP: Nadean Corwin, MD  Brief narrative:  Pt is 58 y.o. 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 In December 2014 (11/16/2013), she was treated with Levaquin and Prednisone for URI (by her PCP). She explains, she initially felt better but suddenly started to get worse with more lethargy, confusion, malaise. Her PCP referred her to ER for further evaluation.   In ED, lab work significant for elevated CR 1.34, Na 127, AG of 18, and WBC 43 K (last WBC 11/19/2013 = 21K), with no clear source of infection, MR of the abdomen ordered for further evaluation and results worrisome for local recurrence of the metastatic disease in liver. Patient was started on IV fluids, IV insulin and after blood cultures done, broad-spectrum IV antibiotics were started due to concerns for early sepsis. Hospitalists were called for further evaluation and admission.   On note, 11/20/2103, pt was in ED with similar symptoms and at that time and work up including urinalysis, chest x-ray, chest CT, and head CT were all unremarkable. Patient was sent home and advised to continue Levaquin and prednisone taper. It was noted on her CT angiogram of her chest that she had an ill-defined low density measuring 4 cm lesion in the posterior segment of the right hepatic lobe concerning for possible neoplasm.   Principal Problem:  SIRS (systemic inflammatory response syndrome)  - unclear  etiology, work up in progress but with no clear cut source  - continue broad spectrum ABX Vancomycin and Zosyn day #3 - WBC trending down and miraculously WNL this AM - CXR and UA unremarkable  - urine and blood culture pending but negative to date  Active Problems:  Leukocytosis with fever  - management as noted above, WBC WNL this AM - pt afebrile over 48 hours  - CBC in AM  Acute renal failure  - secondary to pre renal etiology  - IVF started and pt has responded well  - Cr is WNL this AM  - advance diet as pt able to tolerate  Hypokalemia  - supplemented and WNL this AM Elevated AG without metabolic acidoses  - secondary to pre renal etiology  - pt started on IVF and has responded well  - AG closed this AM  Hypothyroidism  - continue synthroid  Type 2 diabetes mellitus with hyperglycemia  - off insulin drip and clinically stable, feels better this AM  - continue SSI for now iuntil PO intake improves  HTN  - reasonable inpatient control  Pancreatic insufficieny  - continue pancrease and PPI  Anemia of chronic disease  - clinically stable, no signs of active bleed  - CBC in AM, no indication for transfusion  Adenocarcinoma of distal CBD, well differentiated (March 2009)  - appears to be metastatic at this point based on MRI abdomen  - appreciate oncology input  - no role for chemotherapy durine an acute illness  History of Roux-en-Y gastric bypass   Consultants:  Oncology  Procedures/Studies:  Dg Chest 2 View 11/25/2013 There is no evidence  of pneumonia nor CHF nor other acute cardiopulmonary abnormality. Mild prominence of the pulmonary interstitial markings bilaterally may be related to the patient's previous smoking history.  Mr Abdomen W Wo Contrast 11/25/2013 As demonstrated on recent Chest CT, there is a new peripherally enhancing mass posteromedially within the right hepatic lobe, highly worrisome for local recurrence or metastatic disease from the patient's bile duct  adenocarcinoma. A liver abscess could have this appearance, although that possibility is considered much less likely. Associated necrotic upper abdominal adenopathy, also worrisome for metastatic disease. Possible small peritoneal implant adjacent to the right hepatic lobe. No significant biliary dilatation. Extrinsic mass effect on the IVC without evidence of occlusion.  Antibiotics:  Vancomycin 01/08 -->  Zosyn 01/09 --.>   Code Status: Full  Family Communication: Pt at bedside  Disposition Plan: Home when medically stable   HPI/Subjective: No events overnight.   Objective: Filed Vitals:   11/27/13 0000 11/27/13 0200 11/27/13 0400 11/27/13 0600  BP: 168/68 135/61 124/56 110/52  Pulse: 59 66 65 62  Temp: 98.3 F (36.8 C)  98.5 F (36.9 C)   TempSrc: Oral  Oral   Resp: 17 19 15 16   Height:      Weight:   75.7 kg (166 lb 14.2 oz)   SpO2: 99% 98% 96% 97%    Intake/Output Summary (Last 24 hours) at 11/27/13 0658 Last data filed at 11/27/13 0600  Gross per 24 hour  Intake   2790 ml  Output      0 ml  Net   2790 ml    Exam:   General:  Pt is alert, follows commands appropriately, not in acute distress  Cardiovascular: Regular rate and rhythm, S1/S2, no murmurs, no rubs, no gallops  Respiratory: Clear to auscultation bilaterally, no wheezing, no crackles, no rhonchi  Abdomen: Soft, non tender, non distended, bowel sounds present, no guarding  Extremities: No edema, pulses DP and PT palpable bilaterally  Neuro: Grossly nonfocal  Data Reviewed: Basic Metabolic Panel:  Recent Labs Lab 11/25/13 1625 11/25/13 1945 11/26/13 0454 11/27/13 0340  NA 127* 134* 139 136*  K 3.9 4.0 3.4* 4.0  CL 87* 96 105 103  CO2 22 26 23 22   GLUCOSE 498* 418* 212* 198*  BUN 13 12 11 9   CREATININE 1.34* 1.29* 1.02 1.08  CALCIUM 8.7 8.4 7.7* 8.2*   Liver Function Tests:  Recent Labs Lab 11/25/13 1625 11/26/13 0454  AST 24 12  ALT 42* 31  ALKPHOS 116 86  BILITOT 0.4 0.3   PROT 6.0 5.0*  ALBUMIN 2.8* 2.1*    CBC:  Recent Labs Lab 11/25/13 1625 11/25/13 1945 11/26/13 0454 11/27/13 0340  WBC 43.0* 40.1* 26.8* 10.0  NEUTROABS 40.0*  --   --   --   HGB 11.1* 11.2* 9.5* 10.8*  HCT 33.2* 33.5* 28.4* 33.4*  MCV 87.6 87.7 87.7 89.5  PLT 257 263 221 216   CBG:  Recent Labs Lab 11/26/13 0402 11/26/13 0756 11/26/13 1215 11/26/13 1720 11/26/13 2138  GLUCAP 166* 162* 147* 181* 166*    Recent Results (from the past 240 hour(s))  CULTURE, BLOOD (ROUTINE X 2)     Status: None   Collection Time    11/25/13  4:25 PM      Result Value Range Status   Specimen Description BLOOD RIGHT ARM   Final   Special Requests BOTTLES DRAWN AEROBIC AND ANAEROBIC 5CC   Final   Culture  Setup Time     Final  Value: 11/25/2013 21:04     Performed at [facility]   Culture     Final   Value:        BLOOD CULTURE RECEIVED NO GROWTH TO DATE CULTURE WILL BE HELD FOR 5 DAYS BEFORE ISSUING A FINAL NEGATIVE REPORT     Performed at [facility]   Report Status PENDING   Incomplete  CULTURE, BLOOD (ROUTINE X 2)     Status: None   Collection Time    11/25/13  4:30 PM      Result Value Range Status   Specimen Description BLOOD LEFT FOREARM   Final   Special Requests BOTTLES DRAWN AEROBIC AND ANAEROBIC 5CC   Final   Culture  Setup Time     Final   Value: 11/25/2013 21:02     Performed at [facility]   Culture     Final   Value:        BLOOD CULTURE RECEIVED NO GROWTH TO DATE CULTURE WILL BE HELD FOR 5 DAYS BEFORE ISSUING A FINAL NEGATIVE REPORT     Performed at [facility]   Report Status PENDING   Incomplete  MRSA PCR SCREENING     Status: None   Collection Time    11/26/13  3:17 AM      Result Value Range Status   MRSA by PCR NEGATIVE  NEGATIVE Final   Comment:            The GeneXpert MRSA Assay (FDA     approved for NASAL specimens     only), is one component of a     comprehensive MRSA colonization     surveillance program.  It is not     intended to diagnose MRSA     infection nor to guide or     monitor treatment for     MRSA infections.     Scheduled Meds: . enoxaparin (LOVENOX) injection  40 mg Subcutaneous Q24H  . insulin aspart  0-15 Units Subcutaneous TID WC  . insulin aspart  0-5 Units Subcutaneous QHS  . levothyroxine  100 mcg Oral Custom  . levothyroxine  200 mcg Oral Custom  . lipase/protease/amylase  1 capsule Oral TID AC  . pantoprazole  80 mg Oral BID  . piperacillin-tazobactam (ZOSYN)  IV  3.375 g Intravenous Q8H  . sodium chloride  3 mL Intravenous Q12H  . vancomycin  1,000 mg Intravenous Q12H   Continuous Infusions: . sodium chloride 50 mL/hr at 11/26/13 1722     Faye Ramsay, MD  [facility] Pager [phone]  If 7PM-7AM, please contact night-coverage [email or web address] Password [email or web address] 11/27/2013, 6:58 AM   LOS: 2 days

## 2013-11-28 LAB — BASIC METABOLIC PANEL
BUN: 7 mg/dL (ref 6–23)
CHLORIDE: 102 meq/L (ref 96–112)
CO2: 25 meq/L (ref 19–32)
Calcium: 8.2 mg/dL — ABNORMAL LOW (ref 8.4–10.5)
Creatinine, Ser: 0.98 mg/dL (ref 0.50–1.10)
GFR calc Af Amer: 72 mL/min — ABNORMAL LOW (ref >90)
GFR calc non Af Amer: 62 mL/min — ABNORMAL LOW (ref >90)
GLUCOSE: 134 mg/dL — AB (ref 70–99)
Potassium: 3.6 mEq/L — ABNORMAL LOW (ref 3.7–5.3)
SODIUM: 138 meq/L (ref 137–147)

## 2013-11-28 LAB — URINE CULTURE

## 2013-11-28 LAB — GLUCOSE, CAPILLARY
GLUCOSE-CAPILLARY: 151 mg/dL — AB (ref 70–99)
Glucose-Capillary: 125 mg/dL — ABNORMAL HIGH (ref 70–99)
Glucose-Capillary: 206 mg/dL — ABNORMAL HIGH (ref 70–99)
Glucose-Capillary: 168 mg/dL — ABNORMAL HIGH (ref 70–99)

## 2013-11-28 LAB — CBC
HCT: 34.6 % — ABNORMAL LOW (ref 36.0–46.0)
HEMOGLOBIN: 11.3 g/dL — AB (ref 12.0–15.0)
MCH: 28.5 pg (ref 26.0–34.0)
MCHC: 32.7 g/dL (ref 30.0–36.0)
MCV: 87.4 fL (ref 78.0–100.0)
Platelets: 219 10*3/uL (ref 150–400)
RBC: 3.96 MIL/uL (ref 3.87–5.11)
RDW: 14.8 % (ref 11.5–15.5)
WBC: 6.7 10*3/uL (ref 4.0–10.5)

## 2013-11-28 LAB — VANCOMYCIN, TROUGH: VANCOMYCIN TR: 25.1 ug/mL — AB (ref 10.0–20.0)

## 2013-11-28 MED ORDER — POTASSIUM CHLORIDE CRYS ER 20 MEQ PO TBCR
40.0000 meq | EXTENDED_RELEASE_TABLET | Freq: Once | ORAL | Status: AC
  Administered 2013-11-28: 40 meq via ORAL
  Filled 2013-11-28: qty 2

## 2013-11-28 MED ORDER — SALINE SPRAY 0.65 % NA SOLN
1.0000 | NASAL | Status: DC | PRN
  Administered 2013-11-28 – 2013-11-29 (×2): 1 via NASAL
  Filled 2013-11-28: qty 44

## 2013-11-28 MED ORDER — VANCOMYCIN HCL IN DEXTROSE 750-5 MG/150ML-% IV SOLN
750.0000 mg | Freq: Two times a day (BID) | INTRAVENOUS | Status: DC
  Administered 2013-11-28 – 2013-11-30 (×3): 750 mg via INTRAVENOUS
  Filled 2013-11-28 (×5): qty 150

## 2013-11-28 NOTE — Progress Notes (Signed)
ANTIBIOTIC CONSULT NOTE - Follow Up Consult  Pharmacy Consult for Vancomycin, Zosyn Indication: Sepsis  Allergies  Allergen Reactions  . Ace Inhibitors Anaphylaxis  . Tilade [Nedocromil] Anaphylaxis  . Reglan [Metoclopramide]   . Betadine [Povidone Iodine] Rash    iching     Patient Measurements: Height: 5\' 7"  (170.2 cm) Weight: 158 lb 15.2 oz (72.1 kg) IBW/kg (Calculated) : 61.6   Vital Signs: Temp: 97.9 F (36.6 C) (01/11 1349) Temp src: Oral (01/11 1349) BP: 125/87 mmHg (01/11 1349) Pulse Rate: 81 (01/11 1349)  Labs:  Recent Labs  11/26/13 0454 11/27/13 0340 11/28/13 0350  HGB 9.5* 10.8* 11.3*  HCT 28.4* 33.4* 34.6*  PLT 221 216 219  CREATININE 1.02 1.08 0.98    Estimated Creatinine Clearance: 60.8 ml/min (by C-G formula based on Cr of 0.98).   Medical History: Past Medical History  Diagnosis Date  . Cancer     bile duct ca  . GERD (gastroesophageal reflux disease)   . Diabetes mellitus   . Hypertension   . Hypothyroid   . Hyperlipidemia   . Asthma     Assessment: 44 yoF sent to [facility] from PCP for fever, weakness, chills, disorientation.  PMHx: Bile duct cancer in remission, recent CT scan with potential liver mass, GERD, DM, HTN, HLD, asthma, and hypothyroidism.  Recent course levaquin 12/30-1/8 for fever.  Pharmacy consulted to dose vancomycin and zosyn for sepsis.  PTA levaquin 1/8 >> Vancomycin >> 1/8 >> Zosyn >>  Tm24h: AF now WBC: 43 > 26.8 > 10 > 6.7 (on prednisone PTA) Renal: SCr 0.98 (improved), CrCl 70 ml/min (CG and normalized)  Microbiology: 1/8: Urine cx: sent 1/8: Blood cx: no growth to date  Day #4 Vancomycin 1g IV q 12 hours, Zosyn 3.375g IV q8h (extended infusion) for sepsis, no clear source.  Goal of Therapy:  Vancomycin trough 15 - 20 mcg/ml Eradication of infection   Plan:  1.  Check vancomycin trough tonight at 1900. 2.  Continue Zosyn extended infusion 3.375g IV q 4 hours  Hershal Coria, PharmD, BCPS Pager:  [phone] 11/28/2013 3:50 PM

## 2013-11-28 NOTE — Progress Notes (Signed)
CRITICAL VALUE ALERT  Critical value received:  Vancomycin trough- 25.1  Date of notification:  11/28/13  Time of notification:  2000  Critical value read back: yes Nurse who received alert:  Laurell Roof, RN  MD notified (1st page):  Baltazar Najjar  Time of first page:  2005  MD notified (2nd page):  Time of second page:  Responding MD:  Baltazar Najjar  Time MD responded:  2030

## 2013-11-28 NOTE — Progress Notes (Signed)
ANTIBIOTIC CONSULT NOTE - FOLLOW UP  Pharmacy Consult for Vancomycin Indication: sepsis  Labs: Estimated Creatinine Clearance: 60.8 ml/min (by C-G formula based on Cr of 0.98).  Recent Labs  11/28/13 1855  VANCOTROUGH 25.1*    Assessment: 68 yoF sent to [facility] from PCP for fever, weakness, chills, disorientation.  Pharmacy consulted to dose vancomycin and zosyn for sepsis, no clear source.  Today is Day #4 Vancomycin and Zosyn.  Cultures: NGTD  Renal: SCr initially elevated, but is now improved.  SCr 0.98 and CrCl 70 ml/min  Vancomycin trough level (25.1) is elevated above goal range.  Goal of Therapy:  Vancomycin trough level 15-20 mcg/ml  Plan:   Decrease to Vancomycin 750mg  IV q12h. (wait additional 4 hours to allow time to clear elevated level)  Recheck Vanc trough at steady state as needed  Follow up renal fxn and culture results.  Courtney Stevens PharmD, BCPS Pager [phone] 11/28/2013 8:29 PM

## 2013-11-28 NOTE — Progress Notes (Signed)
Patient ID: Courtney Stevens, female   DOB: 02-02-1956, 58 y.o.   MRN: [id number]  [facility] PROGRESS NOTE  Courtney Stevens [id number] DOB: [birth year] DOA: 11/25/2013 PCP: [name], MD  Brief narrative:  Pt is 58 y.o. 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 In December 2014 (11/16/2013), she was treated with Levaquin and Prednisone for URI (by her PCP). She explains, she initially felt better but suddenly started to get worse with more lethargy, confusion, malaise. Her PCP referred her to ER for further evaluation.   In ED, lab work significant for elevated CR 1.34, Na 127, AG of 18, and WBC 43 K (last WBC 11/19/2013 = 21K), with no clear source of infection, MR of the abdomen ordered for further evaluation and results worrisome for local recurrence of the metastatic disease in liver. Patient was started on IV fluids, IV insulin and after blood cultures done, broad-spectrum IV antibiotics were started due to concerns for early sepsis. Hospitalists were called for further evaluation and admission.   On note, 11/20/2103, pt was in ED with similar symptoms and at that time and work up including urinalysis, chest x-ray, chest CT, and head CT were all unremarkable. Patient was sent home and advised to continue Levaquin and prednisone taper. It was noted on her CT angiogram of her chest that she had an ill-defined low density measuring 4 cm lesion in the posterior segment of the right hepatic lobe concerning for possible neoplasm.   Principal Problem:  SIRS (systemic inflammatory response syndrome)  - unclear  etiology, work up in progress but with no clear cut source  - continue broad spectrum ABX Vancomycin and Zosyn day #4 adn plan on transitioning to PO in AM - WBC trending down and miraculously WNL again this AM  - CXR and UA unremarkable, 2 D ECHO unremarkable as well   - urine and blood culture pending but negative to date  Active Problems:  Leukocytosis with fever  - management as noted above, WBC WNL this AM  - pt afebrile over 48 hours  - CBC in AM  Acute renal failure  - secondary to pre renal etiology  - IVF started and pt has responded well  - Cr is WNL this AM and will therefore stop IVF  - advance diet as pt able to tolerate  Hypokalemia  - mild, supplement this AM and repeat BMP in AM Elevated AG without metabolic acidoses  - secondary to pre renal etiology  - pt started on IVF and has responded well  - AG closed this AM  Hypothyroidism  - continue synthroid  Type 2 diabetes mellitus with hyperglycemia  - off insulin drip and clinically stable, feels better this AM  - continue SSI for now iuntil PO intake improves  HTN  - reasonable inpatient control  Pancreatic insufficieny  - continue pancrease and PPI  Anemia of chronic disease  - clinically stable, no signs of active bleed  - CBC in AM, no indication for transfusion  Adenocarcinoma of distal CBD, 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 Mild prominence of the pulmonary interstitial markings bilaterally may be related to the patient's previous smoking history.  Mr Abdomen W Wo Contrast 11/25/2013 As demonstrated on recent Chest CT, there is a new peripherally enhancing mass  posteromedially within the right hepatic lobe, highly worrisome for local recurrence or metastatic disease from the patient's bile duct adenocarcinoma. A liver abscess could have this appearance, although that possibility is considered much less likely. Associated necrotic upper abdominal adenopathy, also worrisome for metastatic disease. Possible small peritoneal implant adjacent to the right hepatic lobe. No significant biliary dilatation. Extrinsic mass effect on the IVC without evidence of occlusion.  Antibiotics:  Vancomycin 01/08 -->  Zosyn 01/09 --.>   Code Status: Full  Family Communication: Pt at bedside  Disposition Plan: Home when medically stable  HPI/Subjective: No events overnight.   Objective: Filed Vitals:   11/28/13 0000 11/28/13 0352 11/28/13 0400 11/28/13 0800  BP:  155/87    Pulse:  59    Temp: 98.6 F (37 C)  97.9 F (36.6 C) 97.9 F (36.6 C)  TempSrc: Oral  Oral Oral  Resp:  18    Height:      Weight:   72.1 kg (158 lb 15.2 oz)   SpO2:  98%      Intake/Output Summary (Last 24 hours) at 11/28/13 1017 Last data filed at 11/28/13 0300  Gross per 24 hour  Intake   1210 ml  Output      0 ml  Net   1210 ml    Exam:   General:  Pt is alert, follows commands appropriately, not in acute distress  Cardiovascular: Regular rate and rhythm, S1/S2, no murmurs, no rubs, no gallops  Respiratory: Clear to auscultation bilaterally, no wheezing, no crackles, no rhonchi  Abdomen: Soft, non tender, non distended, bowel sounds present, no guarding  Extremities: No edema, pulses DP and PT palpable bilaterally  Neuro: Grossly nonfocal  Data Reviewed: Basic Metabolic Panel:  Recent Labs Lab 11/25/13 1625 11/25/13 1945 11/26/13 0454 11/27/13 0340 11/28/13 0350  NA 127* 134* 139 136* 138  K 3.9 4.0 3.4* 4.0 3.6*  CL 87* 96 105 103 102  CO2 22 26 23 22 25   GLUCOSE 498* 418* 212* 198* 134*  BUN 13 12 11 9 7   CREATININE 1.34* 1.29* 1.02 1.08 0.98  CALCIUM  8.7 8.4 7.7* 8.2* 8.2*   Liver Function Tests:  Recent Labs Lab 11/25/13 1625 11/26/13 0454  AST 24 12  ALT 42* 31  ALKPHOS 116 86  BILITOT 0.4 0.3  PROT 6.0 5.0*  ALBUMIN 2.8* 2.1*   CBC:  Recent Labs Lab 11/25/13 1625 11/25/13 1945 11/26/13 0454 11/27/13 0340 11/28/13 0350  WBC 43.0* 40.1* 26.8* 10.0 6.7  NEUTROABS 40.0*  --   --   --   --   HGB 11.1* 11.2* 9.5* 10.8* 11.3*  HCT 33.2* 33.5* 28.4* 33.4* 34.6*  MCV 87.6 87.7 87.7 89.5 87.4  PLT 257 263 221 216 219   CBG:  Recent Labs Lab 11/26/13 2138 11/27/13 0754 11/27/13 1216 11/27/13 1643 11/27/13 2204  GLUCAP 166* 132* 227* 102* 307*    Recent Results (from the past 240 hour(s))  CULTURE, BLOOD (ROUTINE X 2)     Status: None   Collection Time    11/25/13  4:25 PM      Result Value Range Status   Specimen Description BLOOD RIGHT ARM   Final   Special Requests BOTTLES DRAWN AEROBIC AND ANAEROBIC 5CC   Final   Culture  Setup Time     Final   Value: 11/25/2013 21:04     Performed at [facility]   Culture     Final   Value:        BLOOD CULTURE RECEIVED NO GROWTH TO DATE CULTURE WILL BE HELD FOR 5 DAYS BEFORE ISSUING A FINAL NEGATIVE REPORT     Performed at [facility]   Report Status PENDING   Incomplete  CULTURE, BLOOD (ROUTINE X 2)     Status: None   Collection Time    11/25/13  4:30 PM      Result Value Range Status   Specimen Description BLOOD LEFT FOREARM   Final   Special Requests BOTTLES DRAWN AEROBIC AND ANAEROBIC 5CC   Final   Culture  Setup Time     Final   Value: 11/25/2013 21:02     Performed at [facility]   Culture     Final   Value:        BLOOD CULTURE RECEIVED NO GROWTH TO DATE CULTURE WILL BE HELD FOR 5 DAYS BEFORE ISSUING A FINAL NEGATIVE REPORT     Performed at [facility]   Report Status PENDING   Incomplete  MRSA PCR SCREENING     Status: None   Collection Time    11/26/13  3:17 AM      Result Value Range Status   MRSA by PCR NEGATIVE   NEGATIVE Final   Comment:            The GeneXpert MRSA Assay (FDA     approved for NASAL specimens     only), is one component of a     comprehensive MRSA colonization     surveillance program. It is not     intended to diagnose MRSA     infection nor to guide or     monitor treatment for     MRSA infections.     Scheduled Meds: . enoxaparin (LOVENOX) injection  40 mg Subcutaneous Q24H  . insulin aspart  0-15 Units Subcutaneous TID WC  . insulin aspart  0-5 Units Subcutaneous QHS  . levothyroxine  100 mcg Oral Custom  . levothyroxine  200 mcg Oral Custom  . lipase/protease/amylase  1 capsule Oral TID AC  . pantoprazole  80 mg Oral BID  . piperacillin-tazobactam (ZOSYN)  IV  3.375 g Intravenous Q8H  . sodium chloride  3 mL Intravenous Q12H  . vancomycin  1,000 mg Intravenous Q12H   Continuous Infusions: . sodium chloride 50 mL/hr at 11/28/13 0354     Faye Ramsay, MD  [facility] Pager [phone]  If 7PM-7AM, please contact night-coverage [email or web address] Password [email or web address] 11/28/2013, 10:17 AM   LOS: 3 days

## 2013-11-29 LAB — GLUCOSE, CAPILLARY
Glucose-Capillary: 121 mg/dL — ABNORMAL HIGH (ref 70–99)
Glucose-Capillary: 177 mg/dL — ABNORMAL HIGH (ref 70–99)
Glucose-Capillary: 209 mg/dL — ABNORMAL HIGH (ref 70–99)
Glucose-Capillary: 209 mg/dL — ABNORMAL HIGH (ref 70–99)
Glucose-Capillary: 149 mg/dL — ABNORMAL HIGH (ref 70–99)

## 2013-11-29 LAB — CBC
HCT: 33.5 % — ABNORMAL LOW (ref 36.0–46.0)
Hemoglobin: 10.7 g/dL — ABNORMAL LOW (ref 12.0–15.0)
MCH: 28.2 pg (ref 26.0–34.0)
MCHC: 31.9 g/dL (ref 30.0–36.0)
MCV: 88.4 fL (ref 78.0–100.0)
PLATELETS: 216 10*3/uL (ref 150–400)
RBC: 3.79 MIL/uL — ABNORMAL LOW (ref 3.87–5.11)
RDW: 14.9 % (ref 11.5–15.5)
WBC: 6.5 10*3/uL (ref 4.0–10.5)

## 2013-11-29 LAB — BASIC METABOLIC PANEL
BUN: 6 mg/dL (ref 6–23)
CO2: 23 mEq/L (ref 19–32)
Calcium: 8.3 mg/dL — ABNORMAL LOW (ref 8.4–10.5)
Chloride: 103 mEq/L (ref 96–112)
Creatinine, Ser: 0.94 mg/dL (ref 0.50–1.10)
GFR, EST AFRICAN AMERICAN: 76 mL/min — AB (ref >90)
GFR, EST NON AFRICAN AMERICAN: 66 mL/min — AB (ref >90)
Glucose, Bld: 161 mg/dL — ABNORMAL HIGH (ref 70–99)
POTASSIUM: 4 meq/L (ref 3.7–5.3)
SODIUM: 137 meq/L (ref 137–147)

## 2013-11-29 NOTE — Progress Notes (Signed)
Inpatient Diabetes Program Recommendations  AACE/ADA: New Consensus Statement on Inpatient Glycemic Control (2013)  Target Ranges:  Prepandial:   less than 140 mg/dL      Peak postprandial:   less than 180 mg/dL (1-2 hours)      Critically ill patients:  140 - 180 mg/dL   Reason for Visit: Hyperglycemia  CBGs 168, 121, 177  Inpatient Diabetes Program Recommendations Insulin - Basal: Consider addition of Lantus 8 units QHS HgbA1C: 7.1% Diet: CHO mod med  Note: Will continue to follow. Thank you. Lorenda Peck, RD, LDN, CDE Inpatient Diabetes Coordinator [phone]

## 2013-11-29 NOTE — Progress Notes (Signed)
Patient ID: Courtney Stevens, female   DOB: January 26, 1956, 58 y.o.   MRN: [id number]  [facility] PROGRESS NOTE  FAUNA NEUNER [id number] DOB: [birth year] DOA: 11/25/2013 PCP: [name], MD  Brief narrative:  Pt is 58 y.o. 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 In December 2014 (11/16/2013), she was treated with Levaquin and Prednisone for URI (by her PCP). She explains, she initially felt better but suddenly started to get worse with more lethargy, confusion, malaise. Her PCP referred her to ER for further evaluation.  In ED, lab work significant for elevated CR 1.34, Na 127, AG of 18, and WBC 43 K (last WBC 11/19/2013 = 21K), with no clear source of infection, MR of the abdomen ordered for further evaluation and results worrisome for local recurrence of the metastatic disease in liver. Patient was started on IV fluids, IV insulin and after blood cultures done, broad-spectrum IV antibiotics were started due to concerns for early sepsis. Hospitalists were called for further evaluation and admission.   On note, 11/20/2103, pt was in ED with similar symptoms and at that time and work up including urinalysis, chest x-ray, chest CT, and head CT were all unremarkable. Patient was sent home and advised to continue Levaquin and prednisone taper. It was noted on her CT angiogram of her chest that she had an ill-defined low density measuring 4 cm lesion in the posterior segment of the right hepatic lobe concerning for possible neoplasm.   Principal Problem:  SIRS (systemic inflammatory response syndrome)  - unclear  etiology, work up in progress but with no clear cut source  - continue broad spectrum ABX Vancomycin and Zosyn day #5 and plan on transitioning to PO in AM  - WBC trending down and miraculously WNL again this AM  - CXR and UA unremarkable, 2 D ECHO unremarkable as well  - urine and blood culture pending but negative to date  Active Problems:  Leukocytosis with fever  - management as noted above, WBC WNL this AM  - pt afebrile over 72 hours  - CBC in AM  Acute renal failure  - secondary to pre renal etiology  - IVF started and pt has responded well  - Cr is WNL this AM - advanced diet and pt tolerating well  Hypokalemia  - mild, supplemented and WNL this AM Elevated AG without metabolic acidoses  - secondary to pre renal etiology  - pt started on IVF and has responded well  - AG closed this AM  Hypothyroidism  - continue synthroid  Type 2 diabetes mellitus with hyperglycemia  - off insulin drip and clinically stable, feels better this AM  - continue SSI for now iuntil PO intake improves  HTN  - reasonable inpatient control  Pancreatic insufficieny  - continue pancrease and PPI  Anemia of chronic disease  - clinically stable, no signs of active bleed  - CBC in AM, no indication for transfusion  Adenocarcinoma of distal CBD, 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 Mild prominence of the pulmonary interstitial markings bilaterally may be related to the patient's previous smoking history.  Mr Abdomen W Wo Contrast 11/25/2013 As demonstrated on recent Chest CT, there is a new peripherally enhancing mass posteromedially within the right hepatic lobe,  highly worrisome for local recurrence or metastatic disease from the patient's bile duct adenocarcinoma. A liver abscess could have this appearance, although that possibility is considered much less likely. Associated necrotic upper abdominal adenopathy, also worrisome for metastatic disease. Possible small peritoneal implant adjacent to the right hepatic lobe. No significant biliary dilatation. Extrinsic mass effect on the IVC without evidence of occlusion.  Antibiotics:  Vancomycin 01/08 -->  Zosyn 01/09 --.>   Code Status: Full  Family Communication: Pt at bedside  Disposition Plan: Home when medically stable  HPI/Subjective: No events overnight.   Objective: Filed Vitals:   11/29/13 0520 11/29/13 1344 11/29/13 1401 11/29/13 2200  BP: 141/68  147/77 136/84  Pulse: 50  92 71  Temp: 97.8 F (36.6 C) 98.5 F (36.9 C) 98.8 F (37.1 C) 98.9 F (37.2 C)  TempSrc: Oral  Oral Oral  Resp: 18  18 18   Height:      Weight:      SpO2: 100%  100% 100%    Intake/Output Summary (Last 24 hours) at 11/29/13 2302 Last data filed at 11/29/13 2200  Gross per 24 hour  Intake    800 ml  Output   3475 ml  Net  -2675 ml    Exam:   General:  Pt is alert, follows commands appropriately, not in acute distress  Cardiovascular: Regular rate and rhythm, S1/S2, no murmurs, no rubs, no gallops  Respiratory: Clear to auscultation bilaterally, no wheezing, no crackles, no rhonchi  Abdomen: Soft, non tender, non distended, bowel sounds present, no guarding  Extremities: No edema, pulses DP and PT palpable bilaterally  Neuro: Grossly nonfocal  Data Reviewed: Basic Metabolic Panel:  Recent Labs Lab 11/25/13 1945 11/26/13 0454 11/27/13 0340 11/28/13 0350 11/29/13 0428  NA 134* 139 136* 138 137  K 4.0 3.4* 4.0 3.6* 4.0  CL 96 105 103 102 103  CO2 26 23 22 25 23   GLUCOSE 418* 212* 198* 134* 161*  BUN 12 11 9 7 6   CREATININE 1.29* 1.02 1.08 0.98 0.94  CALCIUM 8.4 7.7* 8.2* 8.2* 8.3*    Liver Function Tests:  Recent Labs Lab 11/25/13 1625 11/26/13 0454  AST 24 12  ALT 42* 31  ALKPHOS 116 86  BILITOT 0.4 0.3  PROT 6.0 5.0*  ALBUMIN 2.8* 2.1*   No results found for this basename: LIPASE, AMYLASE,  in the last 168 hours No results found for this basename: AMMONIA,  in the last 168 hours CBC:  Recent Labs Lab 11/25/13 1625 11/25/13 1945 11/26/13 0454 11/27/13 0340 11/28/13 0350 11/29/13 0428  WBC 43.0* 40.1* 26.8* 10.0 6.7 6.5  NEUTROABS 40.0*  --   --   --   --   --   HGB 11.1* 11.2* 9.5* 10.8* 11.3* 10.7*  HCT 33.2* 33.5* 28.4* 33.4* 34.6* 33.5*  MCV 87.6 87.7 87.7 89.5 87.4 88.4  PLT 257 263 221 216 219 216   CBG:  Recent Labs Lab 11/29/13 0735 11/29/13 1146 11/29/13 1744 11/29/13 2006 11/29/13 2136  GLUCAP 121* 177* 209* 209* 149*    Recent Results (from the past 240 hour(s))  CULTURE, BLOOD (ROUTINE X 2)     Status: None   Collection Time    11/25/13  4:25 PM      Result Value Range Status   Specimen Description BLOOD RIGHT ARM   Final   Special Requests BOTTLES DRAWN AEROBIC AND ANAEROBIC 5CC   Final   Culture  Setup Time     Final   Value: 11/25/2013 21:04     Performed at [facility]   Culture     Final   Value:        BLOOD CULTURE RECEIVED NO GROWTH TO DATE CULTURE WILL BE HELD FOR 5 DAYS BEFORE ISSUING A FINAL NEGATIVE REPORT     Performed at [facility]   Report Status PENDING   Incomplete  CULTURE, BLOOD (ROUTINE X 2)     Status: None   Collection Time    11/25/13  4:30 PM      Result Value Range Status   Specimen Description BLOOD LEFT FOREARM   Final   Special Requests BOTTLES DRAWN AEROBIC AND ANAEROBIC 5CC   Final   Culture  Setup Time     Final   Value: 11/25/2013 21:02     Performed at [facility]   Culture     Final   Value:        BLOOD CULTURE RECEIVED NO GROWTH TO DATE CULTURE WILL BE HELD FOR 5 DAYS BEFORE ISSUING A FINAL NEGATIVE REPORT     Performed at [facility]    Report Status PENDING   Incomplete  URINE CULTURE     Status: None   Collection Time    11/25/13  5:38 PM      Result Value Range Status   Specimen Description URINE, CLEAN CATCH   Final   Special Requests NONE   Final   Culture  Setup Time     Final   Value: 11/26/2013 00:22     Performed at [facility]     Final   Value: 20,OOO COLONIES/ML     Performed at [facility]   Culture     Final   Value: Multiple bacterial morphotypes present, none predominant. Suggest appropriate recollection if clinically indicated.     Performed at [facility]   Report Status 11/28/2013 FINAL   Final  MRSA PCR SCREENING     Status: None   Collection Time    11/26/13  3:17 AM      Result Value Range Status   MRSA by PCR NEGATIVE  NEGATIVE Final   Comment:            The GeneXpert MRSA Assay (FDA     approved for NASAL specimens     only), is one component of a     comprehensive MRSA colonization     surveillance program. It is not     intended to diagnose MRSA     infection nor to guide or     monitor treatment for     MRSA infections.     Scheduled Meds: . enoxaparin (LOVENOX) injection  40 mg Subcutaneous Q24H  . insulin aspart  0-15 Units Subcutaneous TID WC  . insulin aspart  0-5 Units Subcutaneous QHS  . levothyroxine  100 mcg Oral Custom  . levothyroxine  200 mcg Oral Custom  . lipase/protease/amylase  1 capsule Oral TID AC  . pantoprazole  80 mg Oral BID  . piperacillin-tazobactam (ZOSYN)  IV  3.375 g Intravenous Q8H  . sodium chloride  3 mL Intravenous Q12H  . vancomycin  750 mg Intravenous Q12H   Continuous Infusions:    Faye Ramsay, MD  [facility] Pager [phone]  If 7PM-7AM, please contact night-coverage [email or web address] Password [email or web address] 11/29/2013, 11:02 PM   LOS: 4 days

## 2013-11-29 NOTE — Plan of Care (Signed)
Problem: Phase II Progression Outcomes Goal: Vital signs remain stable Outcome: Progressing Afebrile x 72 hours

## 2013-11-29 NOTE — Progress Notes (Signed)
Courtney Stevens   DOB:12-Dec-1955   NW#:[id number]   MVH#:[id number]  Subjective:  Sitting up in chair at bedside.  Echo negative.  Awaits remaining cultures. She denies pain.  She tolerates PO without difficulty.   Objective:  Filed Vitals:   11/29/13 1401  BP: 147/77  Pulse: 92  Temp: 98.8 F (37.1 C)  Resp: 18    Body mass index is 24.89 kg/(m^2).  Intake/Output Summary (Last 24 hours) at 11/29/13 1815 Last data filed at 11/29/13 1610  Gross per 24 hour  Intake    680 ml  Output   3050 ml  Net  -2370 ml    Laying in bed; chronically ill appearing. Slightly overweight  Sclerae unicteric  Oropharynx clear  No peripheral adenopathy  Lungs clear -- no rales or rhonchi  Heart regular rate and rhythm  Abdomen benign; + BS no organomegaly appreciated  MSK no peripheral edema  Neuro nonfocal   CBG (last 3)   Recent Labs  11/29/13 0735 11/29/13 1146 11/29/13 1744  GLUCAP 121* 177* 209*     Labs:  Lab Results  Component Value Date   WBC 6.5 11/29/2013   HGB 10.7* 11/29/2013   HCT 33.5* 11/29/2013   MCV 88.4 11/29/2013   PLT 216 11/29/2013   NEUTROABS 40.0* 06/22/1323   Basic Metabolic Panel:  Recent Labs Lab 11/25/13 1945 11/26/13 0454 11/27/13 0340 11/28/13 0350 11/29/13 0428  NA 134* 139 136* 138 137  K 4.0 3.4* 4.0 3.6* 4.0  CL 96 105 103 102 103  CO2 26 23 22 25 23   GLUCOSE 418* 212* 198* 134* 161*  BUN 12 11 9 7 6   CREATININE 1.29* 1.02 1.08 0.98 0.94  CALCIUM 8.4 7.7* 8.2* 8.2* 8.3*   GFR Estimated Creatinine Clearance: 63.4 ml/min (by C-G formula based on Cr of 0.94). Liver Function Tests:  Recent Labs Lab 11/25/13 1625 11/26/13 0454  AST 24 12  ALT 42* 31  ALKPHOS 116 86  BILITOT 0.4 0.3  PROT 6.0 5.0*  ALBUMIN 2.8* 2.1*   CBC:  Recent Labs Lab 11/25/13 1625 11/25/13 1945 11/26/13 0454 11/27/13 0340 11/28/13 0350 11/29/13 0428  WBC 43.0* 40.1* 26.8* 10.0 6.7 6.5  NEUTROABS 40.0*  --   --   --   --   --   HGB 11.1* 11.2* 9.5* 10.8*  11.3* 10.7*  HCT 33.2* 33.5* 28.4* 33.4* 34.6* 33.5*  MCV 87.6 87.7 87.7 89.5 87.4 88.4  PLT 257 263 221 216 219 216   CBG:  Recent Labs Lab 11/28/13 1644 11/28/13 2217 11/29/13 0735 11/29/13 1146 11/29/13 1744  GLUCAP 151* 168* 121* 177* 209*   Microbiology Recent Results (from the past 240 hour(s))  CULTURE, BLOOD (ROUTINE X 2)     Status: None   Collection Time    11/25/13  4:25 PM      Result Value Range Status   Specimen Description BLOOD RIGHT ARM   Final   Special Requests BOTTLES DRAWN AEROBIC AND ANAEROBIC 5CC   Final   Culture  Setup Time     Final   Value: 11/25/2013 21:04     Performed at [facility]   Culture     Final   Value:        BLOOD CULTURE RECEIVED NO GROWTH TO DATE CULTURE WILL BE HELD FOR 5 DAYS BEFORE ISSUING A FINAL NEGATIVE REPORT     Performed at [facility]   Report Status PENDING   Incomplete  CULTURE, BLOOD (ROUTINE  X 2)     Status: None   Collection Time    11/25/13  4:30 PM      Result Value Range Status   Specimen Description BLOOD LEFT FOREARM   Final   Special Requests BOTTLES DRAWN AEROBIC AND ANAEROBIC 5CC   Final   Culture  Setup Time     Final   Value: 11/25/2013 21:02     Performed at [facility]   Culture     Final   Value:        BLOOD CULTURE RECEIVED NO GROWTH TO DATE CULTURE WILL BE HELD FOR 5 DAYS BEFORE ISSUING A FINAL NEGATIVE REPORT     Performed at [facility]   Report Status PENDING   Incomplete  URINE CULTURE     Status: None   Collection Time    11/25/13  5:38 PM      Result Value Range Status   Specimen Description URINE, CLEAN CATCH   Final   Special Requests NONE   Final   Culture  Setup Time     Final   Value: 11/26/2013 00:22     Performed at [place]     Final   Value: 20,OOO COLONIES/ML     Performed at [facility]   Culture     Final   Value: Multiple bacterial morphotypes present, none predominant. Suggest appropriate  recollection if clinically indicated.     Performed at [facility]   Report Status 11/28/2013 FINAL   Final  MRSA PCR SCREENING     Status: None   Collection Time    11/26/13  3:17 AM      Result Value Range Status   MRSA by PCR NEGATIVE  NEGATIVE Final   Comment:            The GeneXpert MRSA Assay (FDA     approved for NASAL specimens     only), is one component of a     comprehensive MRSA colonization     surveillance program. It is not     intended to diagnose MRSA     infection nor to guide or     monitor treatment for     MRSA infections.    Results for ANGELLICA, SUGIURA (MRN [id number]) as of 11/26/2013 11:58  Ref. Range 03/04/2010 05:00  CA 125 Latest Range: 0.0-30.2 U/mL 368.9 (H)   Results for PEYSON, COPADO (MRN [id number]) as of 11/26/2013 11:58  Ref. Range 08/14/2012 07:56 01/11/2013 10:23 02/25/2013 09:38 08/31/2013 10:32 11/25/2013 16:25  CA 19-9 Latest Range: <35.0 U/mL 30.4 83.7 (H) 28.3 28.7   CEA Latest Range: 0.0-5.0 ng/mL 12.8 (H) 10.7 (H) 14.3 (H) 12.0 (H) 14.4 (H)    Studies:   RADIOGRAPHIC STUDIES: Personally reviewed by me.  IMPRESSION:  CT CHEST (Apr 14, 2013)  1. The previously noted 1-3 mm pulmonary nodules have either resolved, decreased in size or are stable compared to the prior examination. Given the lack of a clearly definable craniocaudal gradient, and the behavior these nodules, these favored to reflect benign disease (particularly if there has been no interval chemotherapy or other intervention), but are nonspecific. Continued attention on future follow up studies is suggested.  2. Atherosclerosis, including three-vessel coronary artery disease. Please note that although the presence of coronary artery calcium documents the presence of coronary artery disease, the severity of this disease and any potential stenosis cannot be assessed on this non-gated CT  examination. Assessment for  potential risk factor modification, dietary therapy or pharmacologic  therapy may be warranted, if clinically indicated.  3. Additional incidental findings, as above.  No results found.  Assessment: 58 y.o.  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 We are consulted for recommendations on further oncology workup.   1. SIRS/Hyperglycemia/Dehydration.  --Management per primary team.  --Hydration, antibiotics (vancomycin/zosyn) and insulin.  Blood cultures NGTD. Echo negative. Urine culture negative.   CXR negative.     2. 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 Birdie Sons at [facility] center on 02/22/2008.  She underwent a  radical resection for node-positive adenocarcinoma of the bile duct with positive lymph node involvement (2 out of 17 lymph nodes) with positive surgical margins. She is status post radiation with continuous infusion 5-FU from 04/01/2008 through 05/24/2008. She then received 3 cycles of gemcitabine from 05/19/2008 to 12/01/2008. She was re-treated with gemcitabine from 02/17/2009 through 05/25/2009 due to increasing tumor markers. She was switched to single-agent Taxotere and received 3 cycles from 06/15/2009 through 07/27/2009. She reports her last colonoscopy was less than one year ago with one tubulus villous polyp removed and she has to follow up for repeat colonoscopy in 3 years.   --We will establish short follow-up with Dr. Gabriel Rainwater office and my office.    --She is aware of the results of her imaging including her MRI of abdomen indicating a new peripherally enhancing mass posteromedially within the right hepatic lobe, highly worrisome for local recurrence or metastatic disease from the patient's bile duct adenocarcinoma.    Liver abscess is felt to be less  likely.  Of note, there also was associated necrotic upper abdominal adenopathy worrisome for metastatic disease.    -She should recover from #1.   No role for chemotherapy doing her acute illness.   Tumor markers are as noted above.  If determined to not be a surgical candidate, chemotherapy options include gemcitabine/cisplatin combination therapy or clinical trial or best supportive care.    3. DM2.  --As noted above.   4. Normocytic anemia likely Anemia of chronic disease. --She is asymptomatic for anemia presently.  Transfuse prn symptoms.  5. Disposition.  --Full Code.   We will follow this patient.    Mirza Fessel, MD 11/29/2013  6:15 PM

## 2013-11-30 ENCOUNTER — Telehealth: Payer: Self-pay | Admitting: Internal Medicine

## 2013-11-30 ENCOUNTER — Other Ambulatory Visit: Payer: Self-pay | Admitting: Internal Medicine

## 2013-11-30 DIAGNOSIS — C240 Malignant neoplasm of extrahepatic bile duct: Secondary | ICD-10-CM

## 2013-11-30 LAB — CBC
HCT: 32.7 % — ABNORMAL LOW (ref 36.0–46.0)
HEMOGLOBIN: 10.7 g/dL — AB (ref 12.0–15.0)
MCH: 28.7 pg (ref 26.0–34.0)
MCHC: 32.7 g/dL (ref 30.0–36.0)
MCV: 87.7 fL (ref 78.0–100.0)
Platelets: 226 10*3/uL (ref 150–400)
RBC: 3.73 MIL/uL — ABNORMAL LOW (ref 3.87–5.11)
RDW: 14.8 % (ref 11.5–15.5)
WBC: 8.6 10*3/uL (ref 4.0–10.5)

## 2013-11-30 LAB — BASIC METABOLIC PANEL
BUN: 7 mg/dL (ref 6–23)
CO2: 25 mEq/L (ref 19–32)
Calcium: 8.4 mg/dL (ref 8.4–10.5)
Chloride: 101 mEq/L (ref 96–112)
Creatinine, Ser: 1.06 mg/dL (ref 0.50–1.10)
GFR, EST AFRICAN AMERICAN: 66 mL/min — AB (ref >90)
GFR, EST NON AFRICAN AMERICAN: 57 mL/min — AB (ref >90)
Glucose, Bld: 181 mg/dL — ABNORMAL HIGH (ref 70–99)
POTASSIUM: 3.8 meq/L (ref 3.7–5.3)
SODIUM: 137 meq/L (ref 137–147)

## 2013-11-30 LAB — GLUCOSE, CAPILLARY
GLUCOSE-CAPILLARY: 154 mg/dL — AB (ref 70–99)
Glucose-Capillary: 137 mg/dL — ABNORMAL HIGH (ref 70–99)

## 2013-11-30 MED ORDER — HYDROCODONE-ACETAMINOPHEN 5-325 MG PO TABS: 1.0000 | ORAL_TABLET | Freq: Four times a day (QID) | ORAL | Status: DC | PRN

## 2013-11-30 MED ORDER — LEVOFLOXACIN 500 MG PO TABS: 500.0000 mg | ORAL_TABLET | Freq: Every day | ORAL | Status: DC

## 2013-11-30 MED ORDER — ALPRAZOLAM 1 MG PO TABS: 1.0000 mg | ORAL_TABLET | Freq: Every evening | ORAL | Status: DC | PRN

## 2013-11-30 NOTE — Discharge Instructions (Signed)
Urinary Tract Infection  Urinary tract infections (UTIs) can develop anywhere along your urinary tract. Your urinary tract is your body's drainage system for removing wastes and extra water. Your urinary tract includes two kidneys, two ureters, a bladder, and a urethra. Your kidneys are a pair of bean-shaped organs. Each kidney is about the size of your fist. They are located below your ribs, one on each side of your spine.  CAUSES  Infections are caused by microbes, which are microscopic organisms, including fungi, viruses, and bacteria. These organisms are so small that they can only be seen through a microscope. Bacteria are the microbes that most commonly cause UTIs.  SYMPTOMS   Symptoms of UTIs may vary by age and gender of the patient and by the location of the infection. Symptoms in young women typically include a frequent and intense urge to urinate and a painful, burning feeling in the bladder or urethra during urination. Older women and men are more likely to be tired, shaky, and weak and have muscle aches and abdominal pain. A fever may mean the infection is in your kidneys. Other symptoms of a kidney infection include pain in your back or sides below the ribs, nausea, and vomiting.  DIAGNOSIS  To diagnose a UTI, your caregiver will ask you about your symptoms. Your caregiver also will ask to provide a urine sample. The urine sample will be tested for bacteria and white blood cells. White blood cells are made by your body to help fight infection.  TREATMENT   Typically, UTIs can be treated with medication. Because most UTIs are caused by a bacterial infection, they usually can be treated with the use of antibiotics. The choice of antibiotic and length of treatment depend on your symptoms and the type of bacteria causing your infection.  HOME CARE INSTRUCTIONS   If you were prescribed antibiotics, take them exactly as your caregiver instructs you. Finish the medication even if you feel better after you  have only taken some of the medication.   Drink enough water and fluids to keep your urine clear or pale yellow.   Avoid caffeine, tea, and carbonated beverages. They tend to irritate your bladder.   Empty your bladder often. Avoid holding urine for long periods of time.   Empty your bladder before and after sexual intercourse.   After a bowel movement, women should cleanse from front to back. Use each tissue only once.  SEEK MEDICAL CARE IF:    You have back pain.   You develop a fever.   Your symptoms do not begin to resolve within 3 days.  SEEK IMMEDIATE MEDICAL CARE IF:    You have severe back pain or lower abdominal pain.   You develop chills.   You have nausea or vomiting.   You have continued burning or discomfort with urination.  MAKE SURE YOU:    Understand these instructions.   Will watch your condition.   Will get help right away if you are not doing well or get worse.  Document Released: 08/14/2005 Document Revised: 05/05/2012 Document Reviewed: 12/13/2011  [employer] Patient Information 2014 [employer]

## 2013-11-30 NOTE — Discharge Summary (Signed)
Physician Discharge Summary  Courtney Stevens [id number] DOB: 09/10/1956 DOA: 11/25/2013  PCP: [name], MD  Admit date: 11/25/2013 Discharge date: 11/30/2013  Recommendations for Outpatient Follow-up:  1. Pt will need to follow up with PCP in 2-3 weeks post discharge 2. Please obtain BMP to evaluate electrolytes and kidney function 3. Please also check CBC to evaluate Hg and Hct levels 4. Pt discharged on Levaquin to complete therapy for 3 more days post discharge, discussed with Dr. Juliann Mule   Discharge Diagnoses:  Principal Problem:   SIRS (systemic inflammatory response syndrome) Active Problems:   PANCREATIC INSUFFICIENCY   Unspecified essential hypertension   Mixed hyperlipidemia   Intrinsic asthma   History of Roux-en-Y gastric bypass   Fever   Metabolic acidosis   Leukocytosis   Hypothyroidism   Type 2 diabetes mellitus with hyperglycemia  Discharge Condition: Stable  Diet recommendation: Heart healthy diet discussed in details   Brief narrative:  Pt is 58 y.o. female diabetes mellitus, hypertension and hypothyroidism, s/p gastric bypass surgery with Roux-in-Y procedure in 2004 and bile duct cancer s/p bile duct resection in 2009 with chemotherapy/radiation, who presented to [facility] ED with main concern of progressively worsening fatigue that initially started October 2014 and has been associated with progressive lethargy, poor oral intake, intermittent episodes of shortness of breath. In December 2014 (11/16/2013), she was treated with Levaquin and Prednisone for URI (by her PCP). She explains, she initially felt better but suddenly started to get worse with more lethargy, confusion, malaise. Her PCP referred her to ER for further evaluation.  In ED, lab work significant for elevated CR 1.34, Na 127, AG of 18, and WBC 43 K (last WBC 11/19/2013 = 21K), with no clear source of infection, MR of the abdomen ordered for further evaluation and results worrisome for local  recurrence of the metastatic disease in liver. Patient was started on IV fluids, IV insulin and after blood cultures done, broad-spectrum IV antibiotics were started due to concerns for early sepsis. Hospitalists were called for further evaluation and admission.   On note, 11/20/2103, pt was in ED with similar symptoms and at that time and work up including urinalysis, chest x-ray, chest CT, and head CT were all unremarkable. Patient was sent home and advised to continue Levaquin and prednisone taper. It was noted on her CT angiogram of her chest that she had an ill-defined low density measuring 4 cm lesion in the posterior segment of the right hepatic lobe concerning for possible neoplasm.   Principal Problem:  SIRS (systemic inflammatory response syndrome)  - unclear etiology, work up in progress but with no clear cut source  - continue broad spectrum ABX Vancomycin and Zosyn day #5 and plan on transitioning to PO Levaquin to complete 3 days post discharge  - WBC trending down and miraculously WNL again this AM  - CXR and UA unremarkable, 2 D ECHO unremarkable as well  - urine and blood culture negative to date  Active Problems:  Leukocytosis with fever  - management as noted above, WBC WNL this AM  - pt afebrile over 72 hours  Acute renal failure  - secondary to pre renal etiology  - IVF started and pt has responded well  - Cr is WNL this AM  - advanced diet and pt tolerating well  Hypokalemia  - mild, supplemented and WNL this AM  Elevated AG without metabolic acidoses  - secondary to pre renal etiology  - pt started on IVF and has responded well  -  AG closed this AM  Hypothyroidism  - continue synthroid  Type 2 diabetes mellitus with hyperglycemia  - off insulin drip and clinically stable, feels better this AM  HTN  - reasonable inpatient control  Pancreatic insufficieny  - continue pancrease and PPI  Anemia of chronic disease  - clinically stable, no signs of active bleed   Adenocarcinoma of distal CBD, well differentiated (March 2009)  - appears to be metastatic at this point based on MRI abdomen  - appreciate oncology input  - no role for chemotherapy durine an acute illness  History of Roux-en-Y gastric bypass   Consultants:  Oncology  Procedures/Studies:  Dg Chest 2 View 11/25/2013 There is no evidence of pneumonia nor CHF nor other acute cardiopulmonary abnormality. Mild prominence of the pulmonary interstitial markings bilaterally may be related to the patient's previous smoking history.  Mr Abdomen W Wo Contrast 11/25/2013 As demonstrated on recent Chest CT, there is a new peripherally enhancing mass posteromedially within the right hepatic lobe, highly worrisome for local recurrence or metastatic disease from the patient's bile duct adenocarcinoma. A liver abscess could have this appearance, although that possibility is considered much less likely. Associated necrotic upper abdominal adenopathy, also worrisome for metastatic disease. Possible small peritoneal implant adjacent to the right hepatic lobe. No significant biliary dilatation. Extrinsic mass effect on the IVC without evidence of occlusion.  Antibiotics:  Vancomycin 01/08 --> 1/13 Zosyn 01/09 --.> 1/13 Levaquin 1/13 --> 3 more days post discharge   Code Status: Full  Family Communication: Pt at bedside  Disposition Plan: Home today   Discharge Exam: Filed Vitals:   11/30/13 0555  BP: 123/78  Pulse: 76  Temp: 98.8 F (37.1 C)  Resp: 18   Filed Vitals:   11/29/13 1401 11/29/13 2030 11/29/13 2200 11/30/13 0555  BP: 147/77  136/84 123/78  Pulse: 92 80 71 76  Temp: 98.8 F (37.1 C)  98.9 F (37.2 C) 98.8 F (37.1 C)  TempSrc: Oral  Oral Oral  Resp: 18  18 18   Height:      Weight:      SpO2: 100%  100% 98%    General: Pt is alert, follows commands appropriately, not in acute distress Cardiovascular: Regular rate and rhythm, S1/S2 +, no murmurs, no rubs, no gallops Respiratory:  Clear to auscultation bilaterally, no wheezing, no crackles, no rhonchi Abdominal: Soft, non tender, non distended, bowel sounds +, no guarding Extremities: no edema, no cyanosis, pulses palpable bilaterally DP and PT Neuro: Grossly nonfocal  Discharge Instructions  Discharge Orders   Future Appointments Provider Department Dept Phone   12/24/2013 9:00 AM Unk Pinto, MD [facility] [phone]   02/28/2014 9:30 AM [facility] [address] [place] [phone]   02/28/2014 10:00 AM [facility] Covering Provider [place] [phone]   Future Orders Complete By Expires   Diet - low sodium heart healthy  As directed    Increase activity slowly  As directed        Medication List    STOP taking these medications       predniSONE 20 MG tablet  Commonly known as:  DELTASONE      TAKE these medications       ALPRAZolam 1 MG tablet  Commonly known as:  XANAX  Take 1 tablet (1 mg total) by mouth at bedtime as needed. anxiety     calcium citrate-vitamin D 315-200 MG-UNIT per tablet  Commonly known  as:  CITRACAL+D  Take 1 tablet by mouth 3 (three) times daily.     cetirizine 10 MG tablet  Commonly known as:  ZYRTEC  Take 10 mg by mouth every morning.     furosemide 40 MG tablet  Commonly known as:  LASIX  Take 40 mg by mouth 2 (two) times daily.     glyBURIDE 5 MG tablet  Commonly known as:  DIABETA  Take 5 mg by mouth daily.     HYDROcodone-acetaminophen 5-325 MG per tablet  Commonly known as:  NORCO/VICODIN  Take 1 tablet by mouth every 6 (six) hours as needed for moderate pain.     IRON COMPLEX PO  Take 1 tablet by mouth daily. Pt takes 65mg  iron     levofloxacin 500 MG tablet  Commonly known as:  LEVAQUIN  Take 1 tablet (500 mg total) by mouth daily.     levothyroxine 200 MCG tablet  Commonly known as:  SYNTHROID, LEVOTHROID  Take 100-200 mcg by mouth daily. Pt  alternates between 1/2 to 1 tablet daily     lipase/protease/amylase 12000 UNITS Cpep capsule  Commonly known as:  CREON-12/PANCREASE  Take 1 capsule by mouth 3 (three) times daily before meals.     Magnesium 250 MG Tabs  Take 1 tablet by mouth 4 (four) times daily.     metFORMIN 500 MG tablet  Commonly known as:  GLUCOPHAGE  Take 500 mg by mouth 3 (three) times daily with meals. One tab at breakfast, one tab at lunch and two tabs for dinner     multivitamin with minerals tablet  Take 1 tablet by mouth as needed.     omeprazole 40 MG capsule  Commonly known as:  PRILOSEC  Take 40 mg by mouth 2 (two) times daily.     OVER THE COUNTER MEDICATION  Take 1 tablet by mouth 4 (four) times daily. Calcium,magnesium,zinc supplement     potassium chloride SA 20 MEQ tablet  Commonly known as:  K-DUR,KLOR-CON  Take 20 mEq by mouth 2 (two) times daily.     Vitamin D (Ergocalciferol) 50000 UNITS Caps capsule  Commonly known as:  DRISDOL  Take 50,000 Units by mouth every evening.           Follow-up Information   Follow up with MCKEOWN,WILLIAM DAVID, MD In 2 weeks.   Specialty:  Internal Medicine   Contact information:   [address] [place] [address] [phone]       Schedule an appointment as soon as possible for a visit with [facility], DAVID, MD.   Specialty:  Internal Medicine   Contact information:   [place] [address] [phone]       Follow up with Faye Ramsay, MD. (call my cell phone [phone])    Specialty:  Internal Medicine   Contact information:   [address] [place] [address] [phone]        The results of significant diagnostics from this hospitalization (including imaging, microbiology, ancillary and laboratory) are listed below for reference.     Microbiology: Recent Results (from the past 240 hour(s))  CULTURE, BLOOD (ROUTINE X 2)     Status: None   Collection Time    11/25/13  4:25  PM      Result Value Range Status   Specimen Description BLOOD RIGHT ARM   Final   Special Requests BOTTLES DRAWN AEROBIC AND ANAEROBIC 5CC   Final   Culture  Setup Time  Final   Value: 11/25/2013 21:04     Performed at [facility]   Culture     Final   Value:        BLOOD CULTURE RECEIVED NO GROWTH TO DATE CULTURE WILL BE HELD FOR 5 DAYS BEFORE ISSUING A FINAL NEGATIVE REPORT     Performed at [facility]   Report Status PENDING   Incomplete  CULTURE, BLOOD (ROUTINE X 2)     Status: None   Collection Time    11/25/13  4:30 PM      Result Value Range Status   Specimen Description BLOOD LEFT FOREARM   Final   Special Requests BOTTLES DRAWN AEROBIC AND ANAEROBIC 5CC   Final   Culture  Setup Time     Final   Value: 11/25/2013 21:02     Performed at [facility]   Culture     Final   Value:        BLOOD CULTURE RECEIVED NO GROWTH TO DATE CULTURE WILL BE HELD FOR 5 DAYS BEFORE ISSUING A FINAL NEGATIVE REPORT     Performed at [facility]   Report Status PENDING   Incomplete  URINE CULTURE     Status: None   Collection Time    11/25/13  5:38 PM      Result Value Range Status   Specimen Description URINE, CLEAN CATCH   Final   Special Requests NONE   Final   Culture  Setup Time     Final   Value: 11/26/2013 00:22     Performed at [place]     Final   Value: 20,OOO COLONIES/ML     Performed at [facility]   Culture     Final   Value: Multiple bacterial morphotypes present, none predominant. Suggest appropriate recollection if clinically indicated.     Performed at [facility]   Report Status 11/28/2013 FINAL   Final  MRSA PCR SCREENING     Status: None   Collection Time    11/26/13  3:17 AM      Result Value Range Status   MRSA by PCR NEGATIVE  NEGATIVE Final   Comment:            The GeneXpert MRSA Assay (FDA     approved for NASAL specimens     only), is one component of a     comprehensive  MRSA colonization     surveillance program. It is not     intended to diagnose MRSA     infection nor to guide or     monitor treatment for     MRSA infections.     Labs: Basic Metabolic Panel:  Recent Labs Lab 11/26/13 0454 11/27/13 0340 11/28/13 0350 11/29/13 0428 11/30/13 0545  NA 139 136* 138 137 137  K 3.4* 4.0 3.6* 4.0 3.8  CL 105 103 102 103 101  CO2 23 22 25 23 25   GLUCOSE 212* 198* 134* 161* 181*  BUN 11 9 7 6 7   CREATININE 1.02 1.08 0.98 0.94 1.06  CALCIUM 7.7* 8.2* 8.2* 8.3* 8.4   Liver Function Tests:  Recent Labs Lab 11/25/13 1625 11/26/13 0454  AST 24 12  ALT 42* 31  ALKPHOS 116 86  BILITOT 0.4 0.3  PROT 6.0 5.0*  ALBUMIN 2.8* 2.1*   No results found for this basename: LIPASE, AMYLASE,  in the last 168 hours No results found for this  basename: AMMONIA,  in the last 168 hours CBC:  Recent Labs Lab 11/25/13 1625  11/26/13 0454 11/27/13 0340 11/28/13 0350 11/29/13 0428 11/30/13 0545  WBC 43.0*  < > 26.8* 10.0 6.7 6.5 8.6  NEUTROABS 40.0*  --   --   --   --   --   --   HGB 11.1*  < > 9.5* 10.8* 11.3* 10.7* 10.7*  HCT 33.2*  < > 28.4* 33.4* 34.6* 33.5* 32.7*  MCV 87.6  < > 87.7 89.5 87.4 88.4 87.7  PLT 257  < > 221 216 219 216 226  < > = values in this interval not displayed. CBG:  Recent Labs Lab 11/29/13 1146 11/29/13 1744 11/29/13 2006 11/29/13 2136 11/30/13 0732  GLUCAP 177* 209* 209* 149* 154*     SIGNED: Time coordinating discharge: Over 30 minutes  MAGICK-Jame Morrell, MD  [facility] 11/30/2013, 11:14 AM Pager [phone]  If 7PM-7AM, please contact night-coverage [email or web address] Password [email or web address]

## 2013-11-30 NOTE — Progress Notes (Signed)
Discharged instructions reviewed with the patient.  Patient verbalized understanding of discharge instructions and new medications. Work note was provided to patient by MD. No concerns at time of discharge.

## 2013-11-30 NOTE — Telephone Encounter (Signed)
C/D 11/30/13 for appt. 12/10/13 °

## 2013-11-30 NOTE — Telephone Encounter (Signed)
Pt sheduled to follow up with Dr. Juliann Mule 01/23 @ 11. Calendar mailed.

## 2013-12-01 LAB — CULTURE, BLOOD (ROUTINE X 2)
Culture: NO GROWTH
Culture: NO GROWTH

## 2013-12-02 ENCOUNTER — Telehealth: Payer: Self-pay | Admitting: Internal Medicine

## 2013-12-02 NOTE — Telephone Encounter (Signed)
returned pt call and sw and advised on 1.23.15 appt....pt ok and aware

## 2013-12-03 ENCOUNTER — Inpatient Hospital Stay (HOSPITAL_COMMUNITY)
Admission: EM | Admit: 2013-12-03 | Discharge: 2013-12-14 | DRG: 872 | Disposition: A | Discharge status: Home Health | Payer: BC Managed Care – PPO | Attending: Internal Medicine | Admitting: Internal Medicine

## 2013-12-03 ENCOUNTER — Inpatient Hospital Stay (HOSPITAL_COMMUNITY): Payer: BC Managed Care – PPO

## 2013-12-03 ENCOUNTER — Encounter (HOSPITAL_COMMUNITY): Payer: Self-pay | Admitting: Emergency Medicine

## 2013-12-03 ENCOUNTER — Emergency Department (HOSPITAL_COMMUNITY): Payer: BC Managed Care – PPO

## 2013-12-03 DIAGNOSIS — Z79899 Other long term (current) drug therapy: Secondary | ICD-10-CM

## 2013-12-03 DIAGNOSIS — E872 Acidosis, unspecified: Secondary | ICD-10-CM | POA: Diagnosis present

## 2013-12-03 DIAGNOSIS — Z923 Personal history of irradiation: Secondary | ICD-10-CM

## 2013-12-03 DIAGNOSIS — Z87891 Personal history of nicotine dependence: Secondary | ICD-10-CM

## 2013-12-03 DIAGNOSIS — R918 Other nonspecific abnormal finding of lung field: Secondary | ICD-10-CM

## 2013-12-03 DIAGNOSIS — I10 Essential (primary) hypertension: Secondary | ICD-10-CM | POA: Diagnosis present

## 2013-12-03 DIAGNOSIS — E1165 Type 2 diabetes mellitus with hyperglycemia: Secondary | ICD-10-CM

## 2013-12-03 DIAGNOSIS — R509 Fever, unspecified: Secondary | ICD-10-CM

## 2013-12-03 DIAGNOSIS — E782 Mixed hyperlipidemia: Secondary | ICD-10-CM

## 2013-12-03 DIAGNOSIS — D72829 Elevated white blood cell count, unspecified: Secondary | ICD-10-CM | POA: Diagnosis present

## 2013-12-03 DIAGNOSIS — K7110 Toxic liver disease with hepatic necrosis, without coma: Secondary | ICD-10-CM

## 2013-12-03 DIAGNOSIS — E039 Hypothyroidism, unspecified: Secondary | ICD-10-CM | POA: Diagnosis present

## 2013-12-03 DIAGNOSIS — K753 Granulomatous hepatitis, not elsewhere classified: Secondary | ICD-10-CM

## 2013-12-03 DIAGNOSIS — C240 Malignant neoplasm of extrahepatic bile duct: Secondary | ICD-10-CM | POA: Diagnosis present

## 2013-12-03 DIAGNOSIS — K219 Gastro-esophageal reflux disease without esophagitis: Secondary | ICD-10-CM | POA: Diagnosis present

## 2013-12-03 DIAGNOSIS — E119 Type 2 diabetes mellitus without complications: Secondary | ICD-10-CM

## 2013-12-03 DIAGNOSIS — Z9884 Bariatric surgery status: Secondary | ICD-10-CM

## 2013-12-03 DIAGNOSIS — Z9221 Personal history of antineoplastic chemotherapy: Secondary | ICD-10-CM

## 2013-12-03 DIAGNOSIS — Z113 Encounter for screening for infections with a predominantly sexual mode of transmission: Secondary | ICD-10-CM

## 2013-12-03 DIAGNOSIS — D638 Anemia in other chronic diseases classified elsewhere: Secondary | ICD-10-CM | POA: Diagnosis present

## 2013-12-03 DIAGNOSIS — Z86010 Personal history of colonic polyps: Secondary | ICD-10-CM

## 2013-12-03 DIAGNOSIS — Z833 Family history of diabetes mellitus: Secondary | ICD-10-CM

## 2013-12-03 DIAGNOSIS — Z8249 Family history of ischemic heart disease and other diseases of the circulatory system: Secondary | ICD-10-CM

## 2013-12-03 DIAGNOSIS — K8689 Other specified diseases of pancreas: Secondary | ICD-10-CM | POA: Diagnosis present

## 2013-12-03 DIAGNOSIS — R6510 Systemic inflammatory response syndrome (SIRS) of non-infectious origin without acute organ dysfunction: Secondary | ICD-10-CM

## 2013-12-03 DIAGNOSIS — E785 Hyperlipidemia, unspecified: Secondary | ICD-10-CM | POA: Diagnosis present

## 2013-12-03 DIAGNOSIS — Z800 Family history of malignant neoplasm of digestive organs: Secondary | ICD-10-CM

## 2013-12-03 DIAGNOSIS — E1129 Type 2 diabetes mellitus with other diabetic kidney complication: Secondary | ICD-10-CM | POA: Diagnosis present

## 2013-12-03 DIAGNOSIS — A419 Sepsis, unspecified organism: Principal | ICD-10-CM | POA: Diagnosis present

## 2013-12-03 DIAGNOSIS — R160 Hepatomegaly, not elsewhere classified: Secondary | ICD-10-CM

## 2013-12-03 DIAGNOSIS — C787 Secondary malignant neoplasm of liver and intrahepatic bile duct: Secondary | ICD-10-CM | POA: Diagnosis present

## 2013-12-03 DIAGNOSIS — K750 Abscess of liver: Secondary | ICD-10-CM

## 2013-12-03 DIAGNOSIS — J45909 Unspecified asthma, uncomplicated: Secondary | ICD-10-CM | POA: Diagnosis present

## 2013-12-03 DIAGNOSIS — Z820 Family history of epilepsy and other diseases of the nervous system: Secondary | ICD-10-CM

## 2013-12-03 LAB — URINALYSIS, ROUTINE W REFLEX MICROSCOPIC
Bilirubin Urine: NEGATIVE
Glucose, UA: NEGATIVE mg/dL
Hgb urine dipstick: NEGATIVE
Ketones, ur: NEGATIVE mg/dL
LEUKOCYTES UA: NEGATIVE
NITRITE: NEGATIVE
PH: 5.5 (ref 5.0–8.0)
PROTEIN: NEGATIVE mg/dL
Specific Gravity, Urine: 1.009 (ref 1.005–1.030)
Urobilinogen, UA: 0.2 mg/dL (ref 0.0–1.0)

## 2013-12-03 LAB — CBC WITH DIFFERENTIAL/PLATELET
BASOS ABS: 0 10*3/uL (ref 0.0–0.1)
Basophils Relative: 0 % (ref 0–1)
EOS ABS: 0 10*3/uL (ref 0.0–0.7)
EOS PCT: 0 % (ref 0–5)
HCT: 31.5 % — ABNORMAL LOW (ref 36.0–46.0)
Hemoglobin: 10.2 g/dL — ABNORMAL LOW (ref 12.0–15.0)
LYMPHS PCT: 3 % — AB (ref 12–46)
Lymphs Abs: 0.4 10*3/uL — ABNORMAL LOW (ref 0.7–4.0)
MCH: 28.8 pg (ref 26.0–34.0)
MCHC: 32.4 g/dL (ref 30.0–36.0)
MCV: 89 fL (ref 78.0–100.0)
Monocytes Absolute: 0.4 10*3/uL (ref 0.1–1.0)
Monocytes Relative: 2 % — ABNORMAL LOW (ref 3–12)
NEUTROS PCT: 95 % — AB (ref 43–77)
Neutro Abs: 15.7 10*3/uL — ABNORMAL HIGH (ref 1.7–7.7)
PLATELETS: 261 10*3/uL (ref 150–400)
RBC: 3.54 MIL/uL — ABNORMAL LOW (ref 3.87–5.11)
RDW: 14.8 % (ref 11.5–15.5)
WBC: 16.5 10*3/uL — ABNORMAL HIGH (ref 4.0–10.5)

## 2013-12-03 LAB — BASIC METABOLIC PANEL
BUN: 12 mg/dL (ref 6–23)
CALCIUM: 8.8 mg/dL (ref 8.4–10.5)
CO2: 25 meq/L (ref 19–32)
CREATININE: 0.91 mg/dL (ref 0.50–1.10)
Chloride: 96 mEq/L (ref 96–112)
GFR calc Af Amer: 79 mL/min — ABNORMAL LOW (ref >90)
GFR calc non Af Amer: 68 mL/min — ABNORMAL LOW (ref >90)
Glucose, Bld: 176 mg/dL — ABNORMAL HIGH (ref 70–99)
Potassium: 3.5 mEq/L — ABNORMAL LOW (ref 3.7–5.3)
Sodium: 137 mEq/L (ref 137–147)

## 2013-12-03 LAB — INFLUENZA PANEL BY PCR (TYPE A & B)
H1N1 flu by pcr: NOT DETECTED
Influenza A By PCR: NEGATIVE
Influenza B By PCR: NEGATIVE

## 2013-12-03 LAB — GLUCOSE, CAPILLARY
Glucose-Capillary: 135 mg/dL — ABNORMAL HIGH (ref 70–99)
Glucose-Capillary: 213 mg/dL — ABNORMAL HIGH (ref 70–99)

## 2013-12-03 LAB — CG4 I-STAT (LACTIC ACID): Lactic Acid, Venous: 4.28 mmol/L — ABNORMAL HIGH (ref 0.5–2.2)

## 2013-12-03 MED ORDER — MAGNESIUM OXIDE 400 (241.3 MG) MG PO TABS
200.0000 mg | ORAL_TABLET | Freq: Three times a day (TID) | ORAL | Status: DC
  Administered 2013-12-03 – 2013-12-10 (×29): 200 mg via ORAL
  Administered 2013-12-11: 22:00:00 via ORAL
  Administered 2013-12-11 – 2013-12-14 (×13): 200 mg via ORAL
  Filled 2013-12-03 (×47): qty 0.5

## 2013-12-03 MED ORDER — ENOXAPARIN SODIUM 40 MG/0.4ML ~~LOC~~ SOLN
40.0000 mg | SUBCUTANEOUS | Status: DC
  Administered 2013-12-03: 21:00:00 40 mg via SUBCUTANEOUS
  Filled 2013-12-03 (×2): qty 0.4

## 2013-12-03 MED ORDER — LEVOTHYROXINE SODIUM 100 MCG PO TABS
100.0000 ug | ORAL_TABLET | ORAL | Status: DC
  Administered 2013-12-04 – 2013-12-14 (×6): 100 ug via ORAL
  Filled 2013-12-03 (×7): qty 1

## 2013-12-03 MED ORDER — POTASSIUM CHLORIDE CRYS ER 20 MEQ PO TBCR
20.0000 meq | EXTENDED_RELEASE_TABLET | Freq: Two times a day (BID) | ORAL | Status: DC
  Administered 2013-12-03 – 2013-12-14 (×22): 20 meq via ORAL
  Filled 2013-12-03 (×23): qty 1

## 2013-12-03 MED ORDER — MAGNESIUM 250 MG PO TABS: 1.0000 | ORAL_TABLET | Freq: Four times a day (QID) | ORAL | Status: DC

## 2013-12-03 MED ORDER — VITAMIN D (ERGOCALCIFEROL) 1.25 MG (50000 UNIT) PO CAPS
50000.0000 [IU] | ORAL_CAPSULE | Freq: Every evening | ORAL | Status: DC
  Administered 2013-12-04 – 2013-12-13 (×10): 50000 [IU] via ORAL
  Filled 2013-12-03 (×12): qty 1

## 2013-12-03 MED ORDER — DEXTROSE 5 % IV SOLN
2.0000 g | INTRAVENOUS | Status: AC
  Administered 2013-12-03: 2 g via INTRAVENOUS
  Filled 2013-12-03: qty 2

## 2013-12-03 MED ORDER — VANCOMYCIN HCL IN DEXTROSE 750-5 MG/150ML-% IV SOLN
750.0000 mg | Freq: Two times a day (BID) | INTRAVENOUS | Status: DC
  Administered 2013-12-04 – 2013-12-05 (×4): 750 mg via INTRAVENOUS
  Filled 2013-12-03 (×5): qty 150

## 2013-12-03 MED ORDER — MULTI-VITAMIN/MINERALS PO TABS: 1.0000 | ORAL_TABLET | Freq: Every day | ORAL | Status: DC

## 2013-12-03 MED ORDER — IOHEXOL 300 MG/ML  SOLN
25.0000 mL | Freq: Once | INTRAMUSCULAR | Status: AC | PRN
  Administered 2013-12-03: 25 mL via ORAL

## 2013-12-03 MED ORDER — DIPHENHYDRAMINE HCL 25 MG PO CAPS
25.0000 mg | ORAL_CAPSULE | Freq: Four times a day (QID) | ORAL | Status: DC | PRN
  Administered 2013-12-11 – 2013-12-14 (×4): 25 mg via ORAL
  Filled 2013-12-03 (×6): qty 1

## 2013-12-03 MED ORDER — SODIUM CHLORIDE 0.9 % IV BOLUS (SEPSIS)
1000.0000 mL | Freq: Once | INTRAVENOUS | Status: AC
  Administered 2013-12-03: 1000 mL via INTRAVENOUS

## 2013-12-03 MED ORDER — IRON COMPLEX PO CAPS: 1.0000 | ORAL_CAPSULE | Freq: Every day | ORAL | Status: DC

## 2013-12-03 MED ORDER — IOHEXOL 300 MG/ML  SOLN
100.0000 mL | Freq: Once | INTRAMUSCULAR | Status: AC | PRN
  Administered 2013-12-03: 100 mL via INTRAVENOUS

## 2013-12-03 MED ORDER — SODIUM CHLORIDE 0.9 % IV SOLN: INTRAVENOUS | Status: AC

## 2013-12-03 MED ORDER — DEXTROSE 5 % IV SOLN
2.0000 g | Freq: Two times a day (BID) | INTRAVENOUS | Status: DC
  Administered 2013-12-04 – 2013-12-09 (×11): 2 g via INTRAVENOUS
  Filled 2013-12-03 (×14): qty 2

## 2013-12-03 MED ORDER — ACETAMINOPHEN 325 MG PO TABS
650.0000 mg | ORAL_TABLET | Freq: Four times a day (QID) | ORAL | Status: DC | PRN
  Administered 2013-12-08 – 2013-12-10 (×3): 650 mg via ORAL
  Filled 2013-12-03: qty 2

## 2013-12-03 MED ORDER — ONDANSETRON HCL 4 MG/2ML IJ SOLN: 4.0000 mg | Freq: Four times a day (QID) | INTRAMUSCULAR | Status: DC | PRN

## 2013-12-03 MED ORDER — CALCIUM CITRATE-VITAMIN D 315-200 MG-UNIT PO TABS: 1.0000 | ORAL_TABLET | Freq: Three times a day (TID) | ORAL | Status: DC

## 2013-12-03 MED ORDER — PANTOPRAZOLE SODIUM 40 MG PO TBEC
40.0000 mg | DELAYED_RELEASE_TABLET | Freq: Every day | ORAL | Status: DC
  Administered 2013-12-04 – 2013-12-14 (×11): 40 mg via ORAL
  Filled 2013-12-03 (×11): qty 1

## 2013-12-03 MED ORDER — HYDROCODONE-ACETAMINOPHEN 5-325 MG PO TABS
1.0000 | ORAL_TABLET | Freq: Four times a day (QID) | ORAL | Status: DC | PRN
  Administered 2013-12-11 – 2013-12-14 (×6): 1 via ORAL
  Filled 2013-12-03 (×6): qty 1

## 2013-12-03 MED ORDER — LEVOTHYROXINE SODIUM 100 MCG PO TABS
200.0000 ug | ORAL_TABLET | ORAL | Status: DC
  Administered 2013-12-05 – 2013-12-13 (×5): 200 ug via ORAL
  Filled 2013-12-03 (×6): qty 2

## 2013-12-03 MED ORDER — LORATADINE 10 MG PO TABS
10.0000 mg | ORAL_TABLET | Freq: Every day | ORAL | Status: DC
  Administered 2013-12-04 – 2013-12-14 (×11): 10 mg via ORAL
  Filled 2013-12-03 (×11): qty 1

## 2013-12-03 MED ORDER — ACETAMINOPHEN 325 MG PO TABS
650.0000 mg | ORAL_TABLET | Freq: Four times a day (QID) | ORAL | Status: DC | PRN
  Administered 2013-12-03 – 2013-12-12 (×2): 650 mg via ORAL
  Filled 2013-12-03 (×5): qty 2

## 2013-12-03 MED ORDER — CALCIUM CARBONATE-VITAMIN D 500-200 MG-UNIT PO TABS
1.0000 | ORAL_TABLET | Freq: Three times a day (TID) | ORAL | Status: DC
  Administered 2013-12-04 – 2013-12-14 (×32): 1 via ORAL
  Filled 2013-12-03 (×35): qty 1

## 2013-12-03 MED ORDER — PANCRELIPASE (LIP-PROT-AMYL) 12000-38000 UNITS PO CPEP
1.0000 | ORAL_CAPSULE | Freq: Three times a day (TID) | ORAL | Status: DC
  Administered 2013-12-03 – 2013-12-14 (×33): 1 via ORAL
  Filled 2013-12-03 (×36): qty 1

## 2013-12-03 MED ORDER — ALPRAZOLAM 1 MG PO TABS
1.0000 mg | ORAL_TABLET | Freq: Every evening | ORAL | Status: DC | PRN
  Administered 2013-12-03 – 2013-12-13 (×11): 1 mg via ORAL
  Filled 2013-12-03 (×11): qty 1

## 2013-12-03 MED ORDER — VANCOMYCIN HCL IN DEXTROSE 1-5 GM/200ML-% IV SOLN
1000.0000 mg | Freq: Once | INTRAVENOUS | Status: AC
  Administered 2013-12-03: 1000 mg via INTRAVENOUS
  Filled 2013-12-03: qty 200

## 2013-12-03 MED ORDER — POLYSACCHARIDE IRON COMPLEX 150 MG PO CAPS
150.0000 mg | ORAL_CAPSULE | Freq: Every day | ORAL | Status: DC
  Administered 2013-12-04 – 2013-12-14 (×11): 150 mg via ORAL
  Filled 2013-12-03 (×12): qty 1

## 2013-12-03 MED ORDER — ONDANSETRON HCL 4 MG PO TABS: 4.0000 mg | ORAL_TABLET | Freq: Four times a day (QID) | ORAL | Status: DC | PRN

## 2013-12-03 MED ORDER — SODIUM CHLORIDE 0.9 % IV SOLN
1000.0000 mL | Freq: Once | INTRAVENOUS | Status: AC
Start: 2013-12-03 — End: 2013-12-03
  Administered 2013-12-03: 1000 mL via INTRAVENOUS

## 2013-12-03 MED ORDER — SODIUM CHLORIDE 0.9 % IV SOLN
INTRAVENOUS | Status: DC
  Administered 2013-12-03 – 2013-12-14 (×16): via INTRAVENOUS

## 2013-12-03 MED ORDER — ACETAMINOPHEN 650 MG RE SUPP: 650.0000 mg | Freq: Four times a day (QID) | RECTAL | Status: DC | PRN

## 2013-12-03 MED ORDER — SODIUM CHLORIDE 0.9 % IJ SOLN
3.0000 mL | Freq: Two times a day (BID) | INTRAMUSCULAR | Status: DC
  Administered 2013-12-03 – 2013-12-13 (×6): 3 mL via INTRAVENOUS

## 2013-12-03 MED ORDER — FUROSEMIDE 40 MG PO TABS
40.0000 mg | ORAL_TABLET | Freq: Two times a day (BID) | ORAL | Status: DC
  Administered 2013-12-04 – 2013-12-14 (×17): 40 mg via ORAL
  Filled 2013-12-03 (×24): qty 1

## 2013-12-03 MED ORDER — INSULIN ASPART 100 UNIT/ML ~~LOC~~ SOLN
0.0000 [IU] | Freq: Three times a day (TID) | SUBCUTANEOUS | Status: DC
  Administered 2013-12-03: 2 [IU] via SUBCUTANEOUS
  Administered 2013-12-04: 3 [IU] via SUBCUTANEOUS
  Administered 2013-12-04: 2 [IU] via SUBCUTANEOUS
  Administered 2013-12-05: 8 [IU] via SUBCUTANEOUS
  Administered 2013-12-05: 5 [IU] via SUBCUTANEOUS
  Administered 2013-12-05: 3 [IU] via SUBCUTANEOUS
  Administered 2013-12-06: 8 [IU] via SUBCUTANEOUS
  Administered 2013-12-06: 3 [IU] via SUBCUTANEOUS
  Administered 2013-12-06 – 2013-12-07 (×2): 8 [IU] via SUBCUTANEOUS
  Administered 2013-12-07: 3 [IU] via SUBCUTANEOUS
  Administered 2013-12-07: 2 [IU] via SUBCUTANEOUS
  Administered 2013-12-08: 3 [IU] via SUBCUTANEOUS
  Administered 2013-12-08: 15 [IU] via SUBCUTANEOUS
  Administered 2013-12-09: 5 [IU] via SUBCUTANEOUS
  Administered 2013-12-09: 2 [IU] via SUBCUTANEOUS
  Administered 2013-12-09: 4 [IU] via SUBCUTANEOUS
  Administered 2013-12-10: 3 [IU] via SUBCUTANEOUS
  Administered 2013-12-10: 8 [IU] via SUBCUTANEOUS
  Administered 2013-12-10: 3 [IU] via SUBCUTANEOUS
  Administered 2013-12-11: 2 [IU] via SUBCUTANEOUS
  Administered 2013-12-11: 5 [IU] via SUBCUTANEOUS
  Administered 2013-12-11 – 2013-12-12 (×2): 2 [IU] via SUBCUTANEOUS
  Administered 2013-12-12 (×2): 3 [IU] via SUBCUTANEOUS
  Administered 2013-12-13 (×2): 5 [IU] via SUBCUTANEOUS
  Administered 2013-12-13: 2 [IU] via SUBCUTANEOUS
  Administered 2013-12-14: 8 [IU] via SUBCUTANEOUS
  Administered 2013-12-14: 2 [IU] via SUBCUTANEOUS

## 2013-12-03 MED ORDER — ADULT MULTIVITAMIN W/MINERALS CH
1.0000 | ORAL_TABLET | Freq: Every day | ORAL | Status: DC
  Administered 2013-12-04 – 2013-12-14 (×11): 1 via ORAL
  Filled 2013-12-03 (×12): qty 1

## 2013-12-03 NOTE — ED Notes (Signed)
Dr Alvino Chapel aware of elevated I stat Lactic

## 2013-12-03 NOTE — H&P (Signed)
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 She was treated with empiric antibiotic and UA was suspicious for UTI and thus was discharged on oral Levaquin and oral prednisone. Her last abx dose was 2 days back .  In December 2014 (11/16/2013), she was treated with Levaquin and Prednisone for URI (by her PCP). -Her symptoms of weakness poor appetite and lethargy  has actually been doing one since October last year.  MR of the abdomen on recent hospitalization worrisome for local recurrence of the metastatic disease in liver. Patient was started on IV fluids, IV insulin and after blood cultures done, broad-spectrum IV antibiotics were started due to concerns for early sepsis. Hospitalists were called for further evaluation and admission.  She was discharged on 1/13 and reports that she felt fine until the next day when she again felt quite weak and had cough with whitish phlegm. She felt better yesterday but this morning she had subjective chills and  increased weakness with nausea and generalized body aches. She also reports increased frequency of urination for possible dose. She denies any headache, blurred vision, dizziness, chest pain, palpitations, shortness of breath, abdominal pain, dysuria, hematuria, diarrhea, joint pains.   In the ED patient was for about 202.55F, tachycardic to 123, blood pressure of 160 SSA 96, respiratory rate of 24 and O2 sat of 99% on room air. Blood work done for WBC of  16.5k, hemoglobin of 10.2,  hematocrit of 31.5, normal chemistry except for potassium 3.5, blood glucose of 176. Lactic acid was elevated at 4.28. Chest x-ray was unremarkable. Blood culture and flu PCR was sent from the ED and try hospice consulted for admission to telemetry for SIRS.  Review of Systems:  Constitutional: Denies fever, diaphoresis, positive for chills,  appetite change and fatigue.  HEENT: Cough with whitish phlegm, Denies photophobia, eye pain, redness, hearing loss, ear pain, congestion, sore throat, rhinorrhea, sneezing, mouth sores, trouble swallowing, neck pain, neck stiffness and tinnitus.   Respiratory: Cough, Denies SOB, DOE,  chest tightness,  and wheezing.   Cardiovascular: Denies chest pain, palpitations and leg swelling.  Gastrointestinal: Nausea, Denies vomiting, abdominal pain, diarrhea, constipation, blood in stool and abdominal distention.  Genitourinary: Increased daily frequency, Denies dysuria, urgency, hematuria, flank pain and difficulty urinating.  Endocrine: Denies polyuria, polydipsia. Musculoskeletal:  myalgias, back pain, denies joint swelling, arthralgias and gait problem.  Skin: Denies pallor, rash and wound.  Neurological: Generalized weakness, Denies dizziness, seizures, syncope,  light-headedness, numbness and headaches.  Psychiatric/Behavioral: Denies  confusion, nervousness, sleep disturbance and agitation   Past Medical History  Diagnosis Date  . Cancer     bile duct ca  . GERD (gastroesophageal reflux disease)   . Diabetes mellitus   . Hypertension   . Hypothyroid   . Hyperlipidemia   . Asthma    Past Surgical History  Procedure Laterality Date  . Gastric bypass  2004  . Cholecystectomy  1992  . Bile duct resection  2009  . Appendectomy    . Tonsillectomy    . Abdominal hysterectomy  1990  BIL OOPHORECTOMY  . Tubal ligation    . Breast surgery      BIOPSY NEG   Social History:  reports that she quit smoking about 36 years  ago. Her smoking use included Cigarettes. She has a 10 pack-year smoking history. She has never used smokeless tobacco. She reports that she drinks alcohol. She reports that she does not use illicit drugs.  Allergies  Allergen Reactions  . Ace Inhibitors Anaphylaxis  . Tilade [Nedocromil] Anaphylaxis  . Reglan [Metoclopramide] Nausea And Vomiting  . Betadine [Povidone Iodine] Rash    iching     Family History  Problem Relation Age of Onset  . Colon cancer Mother   . Hypertension Mother   . Glaucoma Mother   . Diabetes Father   . Alzheimer's disease Father   . Heart attack Father   . Nephrolithiasis Father   . Emphysema Father     was a smoker  . Heart disease Father   . Nephrolithiasis Sister   . Heart attack Brother   . Nephrolithiasis Brother     Prior to Admission medications   Medication Sig Start Date End Date Taking? Authorizing Provider  ALPRAZolam Duanne Moron) 1 MG tablet Take 1 tablet (1 mg total) by mouth at bedtime as needed. anxiety 11/30/13  Yes Theodis Blaze, MD  calcium citrate-vitamin D (CITRACAL+D) 315-200 MG-UNIT per tablet Take 1 tablet by mouth 3 (three) times daily.   Yes Historical Provider, MD  cetirizine (ZYRTEC) 10 MG tablet Take 10 mg by mouth every morning.     Yes Historical Provider, MD  diphenhydrAMINE (BENADRYL) 25 MG tablet Take 25 mg by mouth every 6 (six) hours as needed.   Yes Historical Provider, MD  furosemide (LASIX) 40 MG tablet Take 40 mg by mouth 2 (two) times daily.   Yes Historical Provider, MD  glyBURIDE (DIABETA) 5 MG tablet Take 5 mg by mouth daily.    Yes Historical Provider, MD  HYDROcodone-acetaminophen (NORCO/VICODIN) 5-325 MG per tablet Take 1 tablet by mouth every 6 (six) hours as needed for moderate pain. 11/30/13  Yes Theodis Blaze, MD  Iron Combinations (IRON COMPLEX PO) Take 1 tablet by mouth daily. Pt takes 65mg  iron    Yes Historical Provider, MD  levofloxacin (LEVAQUIN) 500 MG tablet Take 1 tablet (500 mg total) by mouth daily.  11/30/13  Yes Theodis Blaze, MD  levofloxacin (LEVAQUIN) 500 MG tablet Take 500 mg by mouth daily.   Yes Historical Provider, MD  levothyroxine (SYNTHROID, LEVOTHROID) 200 MCG tablet Take 100-200 mcg by mouth daily. Pt alternates between 1/2 to 1 tablet daily   Yes Historical Provider, MD  lipase/protease/amylase (CREON-10/PANCREASE) 12000 UNITS CPEP Take 1 capsule by mouth 3 (three) times daily before meals.     Yes Historical Provider, MD  Magnesium 250 MG TABS Take 1 tablet by mouth 4 (four) times daily.     Yes Historical Provider, MD  metFORMIN (GLUCOPHAGE) 500 MG tablet Take 500 mg by mouth 3 (three) times daily with meals. One tab at breakfast, one tab at lunch and two tabs for dinner   Yes Historical Provider, MD  Multiple Vitamins-Minerals (MULTIVITAMIN WITH MINERALS) tablet Take 1 tablet by mouth as needed.    Yes Historical Provider, MD  omeprazole (PRILOSEC) 40 MG capsule Take 40 mg by mouth 2 (two) times daily.   Yes Historical Provider, MD  OVER THE COUNTER MEDICATION Take 1 tablet by mouth 4 (four) times daily. Calcium,magnesium,zinc supplement    Yes Historical Provider,  MD  potassium chloride SA (K-DUR,KLOR-CON) 20 MEQ tablet Take 20 mEq by mouth 2 (two) times daily.    Yes Historical Provider, MD  Vitamin D, Ergocalciferol, (DRISDOL) 50000 UNITS CAPS Take 50,000 Units by mouth every evening.    Yes Historical Provider, MD    Physical Exam:  Filed Vitals:   12/03/13 1235 12/03/13 1319 12/03/13 1400 12/03/13 1531  BP: 168/76 141/68 127/67 115/57  Pulse: 111 108 104 102  Temp:  102.9 F (39.4 C)  98.8 F (37.1 C)  TempSrc:  Oral  Oral  Resp: 24 18 17 18   Height:    5\' 7"  (1.702 m)  Weight:    70.761 kg (156 lb)  SpO2: 96% 96% 98% 98%    Constitutional: Vital signs reviewed.  Middle aged female lying in bed in no acute distress HEENT: No pallor, no icterus, dry oral mucosa Cardiovascular: RRR, S1 normal, S2 normal, no MRG,  Pulmonary/Chest: CTAB, no wheezes, rales, or  rhonchi Abdominal: Soft.  non-distended, bowel sounds are normal, tender to palpation over right upper quadrant and left lower quadrant no masses, organomegaly, or guarding present.  No CVA tenderness Extremities: Warm, no edema CNS: AAO x3  Labs on Admission:  Basic Metabolic Panel:  Recent Labs Lab 11/27/13 0340 11/28/13 0350 11/29/13 0428 11/30/13 0545 12/03/13 1315  NA 136* 138 137 137 137  K 4.0 3.6* 4.0 3.8 3.5*  CL 103 102 103 101 96  CO2 22 25 23 25 25   GLUCOSE 198* 134* 161* 181* 176*  BUN 9 7 6 7 12   CREATININE 1.08 0.98 0.94 1.06 0.91  CALCIUM 8.2* 8.2* 8.3* 8.4 8.8   Liver Function Tests: No results found for this basename: AST, ALT, ALKPHOS, BILITOT, PROT, ALBUMIN,  in the last 168 hours No results found for this basename: LIPASE, AMYLASE,  in the last 168 hours No results found for this basename: AMMONIA,  in the last 168 hours CBC:  Recent Labs Lab 11/27/13 0340 11/28/13 0350 11/29/13 0428 11/30/13 0545 12/03/13 1315  WBC 10.0 6.7 6.5 8.6 16.5*  NEUTROABS  --   --   --   --  15.7*  HGB 10.8* 11.3* 10.7* 10.7* 10.2*  HCT 33.4* 34.6* 33.5* 32.7* 31.5*  MCV 89.5 87.4 88.4 87.7 89.0  PLT 216 219 216 226 261   Cardiac Enzymes: No results found for this basename: CKTOTAL, CKMB, CKMBINDEX, TROPONINI,  in the last 168 hours BNP: No components found with this basename: POCBNP,  CBG:  Recent Labs Lab 11/29/13 1744 11/29/13 2006 11/29/13 2136 11/30/13 0732 11/30/13 1207  GLUCAP 209* 209* 149* 154* 137*    Radiological Exams on Admission: Dg Chest 2 View  12/03/2013   CLINICAL DATA:  Fever, cough.  EXAM: CHEST  2 VIEW  COMPARISON:  November 25, 2013.  FINDINGS: The heart size and mediastinal contours are within normal limits. Both lungs are clear. No pleural effusion or pneumothorax is noted. The visualized skeletal structures are unremarkable.  IMPRESSION: No active cardiopulmonary disease.   Electronically Signed   By: [name] M.D.   On:  12/03/2013 14:14      Assessment/Plan  Principal Problem: sepsis Patient meets criteria for sepsis given leukocytosis, fever, RR>20, , tachycardia and lactic acidosis Admit to telemetry. Follow blood culture, PCR. Given this we'll quadrant pain and elevated lactate I will obtain a CT scan of the abdomen and pelvis with contrast to rule out any intra-abdominal inflammation or abscess. -unclear if pt has sepsis from underlying malignancy --  Place on empiric vancomycin and cefepime to cover for sepsis. If flu PCR and CTG abd negative then we should call  ID consult.  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 There is a new right hepatic lobe mass worrisome for local recurrence versus metastases. Patient seen by oncology Dr. Charlton Amor during recent hospitalization with plan for outpatient followup once acute issue  has resolved. will consult as needed    DM Hold home  medications. We'll place on sliding scale insulin  Pancreatic insufficiency Continue pancreatic enzymes    Unspecified essential hypertension Hold home blood pressure medications.    Hypothyroidism Continue Synthroid  Diet: diabetic  DVT prophylaxis: sq lovenox  Code Status: Full code Family Communication: None at bedside Disposition Plan: Home once improved  Louellen Molder [facility] Pager [phone]  If 7PM-7AM, please contact night-coverage [email or web address] Password [email or web address] 12/03/2013, 4:12 PM   Total time spent: 70 minutes

## 2013-12-03 NOTE — Progress Notes (Signed)
ANTIBIOTIC CONSULT NOTE - INITIAL  Pharmacy Consult for Vancomycin, Cefepime Indication: sepsis  Allergies  Allergen Reactions  . Ace Inhibitors Anaphylaxis  . Tilade [Nedocromil] Anaphylaxis  . Reglan [Metoclopramide] Nausea And Vomiting  . Betadine [Povidone Iodine] Rash    iching     Patient Measurements: Height: 5\' 7"  (170.2 cm) Weight: 156 lb (70.761 kg) IBW/kg (Calculated) : 61.6  Vital Signs: Temp: 98.8 F (37.1 C) (01/16 1531) Temp src: Oral (01/16 1531) BP: 115/57 mmHg (01/16 1531) Pulse Rate: 102 (01/16 1531) Intake/Output from previous day:   Intake/Output from this shift:    Labs:  Recent Labs  12/03/13 1315  WBC 16.5*  HGB 10.2*  PLT 261  CREATININE 0.91   Estimated Creatinine Clearance: 65.5 ml/min (by C-G formula based on Cr of 0.91). No results found for this basename: VANCOTROUGH, VANCOPEAK, VANCORANDOM, [place], GENTPEAK, GENTRANDOM, [place], TOBRAPEAK, TOBRARND, AMIKACINPEAK, AMIKACINTROU, AMIKACIN,  in the last 72 hours   Microbiology: Recent Results (from the past 720 hour(s))  URINE CULTURE     Status: None   Collection Time    11/03/13  4:48 PM      Result Value Range Status   Colony Count NO GROWTH   Final   Organism ID, Bacteria NO GROWTH   Final  URINE CULTURE     Status: None   Collection Time    11/16/13  3:11 PM      Result Value Range Status   Colony Count NO GROWTH   Final   Organism ID, Bacteria NO GROWTH   Final  CULTURE, BLOOD (ROUTINE X 2)     Status: None   Collection Time    11/25/13  4:25 PM      Result Value Range Status   Specimen Description BLOOD RIGHT ARM   Final   Special Requests BOTTLES DRAWN AEROBIC AND ANAEROBIC 5CC   Final   Culture  Setup Time     Final   Value: 11/25/2013 21:04     Performed at [facility]   Culture     Final   Value: NO GROWTH 5 DAYS     Performed at [facility]   Report Status 12/01/2013 FINAL   Final  CULTURE, BLOOD (ROUTINE X 2)     Status: None   Collection Time    11/25/13  4:30 PM      Result Value Range Status   Specimen Description BLOOD LEFT FOREARM   Final   Special Requests BOTTLES DRAWN AEROBIC AND ANAEROBIC 5CC   Final   Culture  Setup Time     Final   Value: 11/25/2013 21:02     Performed at [facility]   Culture     Final   Value: NO GROWTH 5 DAYS     Performed at [facility]   Report Status 12/01/2013 FINAL   Final  URINE CULTURE     Status: None   Collection Time    11/25/13  5:38 PM      Result Value Range Status   Specimen Description URINE, CLEAN CATCH   Final   Special Requests NONE   Final   Culture  Setup Time     Final   Value: 11/26/2013 00:22     Performed at [place]     Final   Value: 20,OOO COLONIES/ML     Performed at [facility]   Culture     Final   Value: Multiple  bacterial morphotypes present, none predominant. Suggest appropriate recollection if clinically indicated.     Performed at [facility]   Report Status 11/28/2013 FINAL   Final  MRSA PCR SCREENING     Status: None   Collection Time    11/26/13  3:17 AM      Result Value Range Status   MRSA by PCR NEGATIVE  NEGATIVE Final   Comment:            The GeneXpert MRSA Assay (FDA     approved for NASAL specimens     only), is one component of a     comprehensive MRSA colonization     surveillance program. It is not     intended to diagnose MRSA     infection nor to guide or     monitor treatment for     MRSA infections.    Medical History: Past Medical History  Diagnosis Date  . Cancer     bile duct ca  . GERD (gastroesophageal reflux disease)   . Diabetes mellitus   . Hypertension   . Hypothyroid   . Hyperlipidemia   . Asthma      Assessment: 18 yoF presents with s/sx's of sepsis.  Patient was recently admitted to [facility] for sepsis of unclear etiology (cultures and influenza screen negative) but UA was suspicious for UTI and patient was started on Vancomycin  and Zosyn and was discharged with oral Levaquin and oral prednisone.  Pt has history of DM, HTN, hypothyroidism, gastric bypass 2004, bile duct cancer s/p chemo/XRT and bile duct resection in 2009.  MR of abdomen on recent hospitalization worrisome for local recurrence of metastatic disease in liver.  Lactic acid 4.28  WBC 16.5  SCr 0.91, CrCl ~73 ml/min (CG)  Current temp 98.8  Blood cultures ordered.  UA appears negative.  CXR in ED was clear.  Goal of Therapy:  Vancomycin trough level 15-20 mcg/ml  Plan:  1.  Vancomycin 1g IV x 1 then 750mg  IV q12h. 2.  Cefepime 2g IV q12h. 3.  F/u SCr, Vancomycin trough, cultures, clinical course.  Hershal Coria 12/03/2013,4:22 PM

## 2013-12-03 NOTE — ED Notes (Signed)
Per patient has been treated for UTI/fever on and off since the beginning of Jan-has been on antibiotics-having chills, body aches, fever

## 2013-12-03 NOTE — ED Provider Notes (Signed)
CSN: [id number]     Arrival date & time 12/03/13  1142 History   First MD Initiated Contact with Patient 12/03/13 1241     Chief Complaint  Patient presents with  . Dysuria   (Consider location/radiation/quality/duration/timing/severity/associated sxs/prior Treatment) Patient is a 58 y.o. female presenting with dysuria. The history is provided by the patient.  Dysuria Associated symptoms: nausea   Associated symptoms: no abdominal pain, no flank pain and no vomiting    patient presents with fever generalized weakness. She has recently been treated for sepsis from a urinary tract infection in the hospital. She states she's been on the hospital for a few days. She states she is began having fevers again and feeling bad. She states she's also developed a cough. She states she's had some sputum production also. She states she was on Levaquin for urinary tract infection. She states she feels fatigued.  Past Medical History  Diagnosis Date  . Cancer     bile duct ca  . GERD (gastroesophageal reflux disease)   . Diabetes mellitus   . Hypertension   . Hypothyroid   . Hyperlipidemia   . Asthma    Past Surgical History  Procedure Laterality Date  . Gastric bypass  2004  . Cholecystectomy  1992  . Bile duct resection  2009  . Appendectomy    . Tonsillectomy    . Abdominal hysterectomy  1990    BIL OOPHORECTOMY  . Tubal ligation    . Breast surgery      BIOPSY NEG   Family History  Problem Relation Age of Onset  . Colon cancer Mother   . Hypertension Mother   . Glaucoma Mother   . Diabetes Father   . Alzheimer's disease Father   . Heart attack Father   . Nephrolithiasis Father   . Emphysema Father     was a smoker  . Heart disease Father   . Nephrolithiasis Sister   . Heart attack Brother   . Nephrolithiasis Brother    History  Substance Use Topics  . Smoking status: Former Smoker -- 2.00 packs/day for 5 years    Types: Cigarettes    Quit date: 11/18/1977  . Smokeless  tobacco: Never Used  . Alcohol Use: Yes     Comment: 2-3 glasses of wine once per month    OB History   Grav Para Term Preterm Abortions TAB SAB Ect Mult Living                 Review of Systems  Constitutional: Positive for appetite change and fatigue. Negative for activity change.  Eyes: Negative for pain.  Respiratory: Positive for cough. Negative for chest tightness and shortness of breath.   Cardiovascular: Negative for chest pain and leg swelling.  Gastrointestinal: Positive for nausea. Negative for vomiting, abdominal pain and diarrhea.  Genitourinary: Positive for dysuria. Negative for flank pain.  Musculoskeletal: Negative for back pain and neck stiffness.  Skin: Negative for rash.  Neurological: Negative for weakness, numbness and headaches.  Psychiatric/Behavioral: Negative for behavioral problems.    Allergies  Ace inhibitors; Tilade; Reglan; and Betadine  Home Medications   No current outpatient prescriptions on file. BP 141/85  Pulse 95  Temp(Src) 97.8 F (36.6 C) (Oral)  Resp 20  Ht 5\' 7"  (1.702 m)  Wt 156 lb (70.761 kg)  BMI 24.43 kg/m2  SpO2 97% Physical Exam  Nursing note and vitals reviewed. Constitutional: She is oriented to person, place, and time. She appears well-developed and  well-nourished.  HENT:  Head: Normocephalic and atraumatic.  Eyes: EOM are normal. Pupils are equal, round, and reactive to light.  Neck: Normal range of motion. Neck supple.  Cardiovascular: Regular rhythm and normal heart sounds.   No murmur heard. Tachycardia  Pulmonary/Chest: Effort normal and breath sounds normal. No respiratory distress. She has no wheezes. She has no rales.  Abdominal: Soft. Bowel sounds are normal. She exhibits no distension. There is no tenderness. There is no rebound and no guarding.  Genitourinary:  No CVA tenderness  Musculoskeletal: Normal range of motion.  Neurological: She is alert and oriented to person, place, and time. No cranial nerve  deficit.  Skin: Skin is warm and dry.  Psychiatric: She has a normal mood and affect. Her speech is normal.    ED Course  Procedures (including critical care time) Labs Review Labs Reviewed  BASIC METABOLIC PANEL - Abnormal; Notable for the following:    Potassium 3.5 (*)    Glucose, Bld 176 (*)    GFR calc non Af Amer 68 (*)    GFR calc Af Amer 79 (*)    All other components within normal limits  CBC WITH DIFFERENTIAL - Abnormal; Notable for the following:    WBC 16.5 (*)    RBC 3.54 (*)    Hemoglobin 10.2 (*)    HCT 31.5 (*)    Neutrophils Relative % 95 (*)    Neutro Abs 15.7 (*)    Lymphocytes Relative 3 (*)    Lymphs Abs 0.4 (*)    Monocytes Relative 2 (*)    All other components within normal limits  BASIC METABOLIC PANEL - Abnormal; Notable for the following:    Glucose, Bld 169 (*)    Calcium 8.3 (*)    GFR calc non Af Amer 59 (*)    GFR calc Af Amer 69 (*)    All other components within normal limits  CBC - Abnormal; Notable for the following:    WBC 23.2 (*)    RBC 3.47 (*)    Hemoglobin 9.8 (*)    HCT 31.1 (*)    All other components within normal limits  HEPATIC FUNCTION PANEL - Abnormal; Notable for the following:    Total Protein 5.7 (*)    Albumin 2.2 (*)    Alkaline Phosphatase 131 (*)    Total Bilirubin 0.2 (*)    All other components within normal limits  GLUCOSE, CAPILLARY - Abnormal; Notable for the following:    Glucose-Capillary 135 (*)    All other components within normal limits  GLUCOSE, CAPILLARY - Abnormal; Notable for the following:    Glucose-Capillary 213 (*)    All other components within normal limits  GLUCOSE, CAPILLARY - Abnormal; Notable for the following:    Glucose-Capillary 145 (*)    All other components within normal limits  APTT - Abnormal; Notable for the following:    aPTT 41 (*)    All other components within normal limits  GLUCOSE, CAPILLARY - Abnormal; Notable for the following:    Glucose-Capillary 172 (*)    All  other components within normal limits  GLUCOSE, CAPILLARY - Abnormal; Notable for the following:    Glucose-Capillary 109 (*)    All other components within normal limits  COMPREHENSIVE METABOLIC PANEL - Abnormal; Notable for the following:    Sodium 136 (*)    Glucose, Bld 155 (*)    Albumin 2.4 (*)    Alkaline Phosphatase 123 (*)    GFR  calc non Af Amer 56 (*)    GFR calc Af Amer 65 (*)    All other components within normal limits  CBC WITH DIFFERENTIAL - Abnormal; Notable for the following:    WBC 13.7 (*)    RBC 3.79 (*)    Hemoglobin 10.8 (*)    HCT 33.7 (*)    Neutrophils Relative % 88 (*)    Neutro Abs 12.0 (*)    Lymphocytes Relative 6 (*)    All other components within normal limits  GLUCOSE, CAPILLARY - Abnormal; Notable for the following:    Glucose-Capillary 241 (*)    All other components within normal limits  GLUCOSE, CAPILLARY - Abnormal; Notable for the following:    Glucose-Capillary 167 (*)    All other components within normal limits  GLUCOSE, CAPILLARY - Abnormal; Notable for the following:    Glucose-Capillary 212 (*)    All other components within normal limits  BASIC METABOLIC PANEL - Abnormal; Notable for the following:    Potassium 3.6 (*)    Glucose, Bld 183 (*)    GFR calc non Af Amer 69 (*)    GFR calc Af Amer 80 (*)    All other components within normal limits  CBC - Abnormal; Notable for the following:    RBC 3.67 (*)    Hemoglobin 10.4 (*)    HCT 32.6 (*)    All other components within normal limits  GLUCOSE, CAPILLARY - Abnormal; Notable for the following:    Glucose-Capillary 254 (*)    All other components within normal limits  GLUCOSE, CAPILLARY - Abnormal; Notable for the following:    Glucose-Capillary 206 (*)    All other components within normal limits  GLUCOSE, CAPILLARY - Abnormal; Notable for the following:    Glucose-Capillary 155 (*)    All other components within normal limits  GLUCOSE, CAPILLARY - Abnormal; Notable for  the following:    Glucose-Capillary 275 (*)    All other components within normal limits  CG4 I-STAT (LACTIC ACID) - Abnormal; Notable for the following:    Lactic Acid, Venous 4.28 (*)    All other components within normal limits  CULTURE, BLOOD (ROUTINE X 2)  CULTURE, BLOOD (ROUTINE X 2)  CULTURE, ROUTINE-ABSCESS  CULTURE, EXPECTORATED SPUTUM-ASSESSMENT  URINALYSIS, ROUTINE W REFLEX MICROSCOPIC  INFLUENZA PANEL BY PCR (TYPE A & B, H1N1)  PROTIME-INR  VANCOMYCIN, TROUGH  HEPATITIS PANEL, ACUTE  HIV 1 RNA QUANT-NO REFLEX-BLD  SURGICAL PATHOLOGY   Imaging Review US Biopsy  12/04/2013   CLINICAL DATA:  58 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 Patient has an elevated white count and chills. Evaluate for recurrent cancer versus abscess.  EXAM: ULTRASOUND GUIDED BIOPSY OF THE LIVER LESION  MEDICATIONS: 2.0 mg IV Versed; 100 mcg IV Fentanyl. A radiology nurse monitored the patient throughout the procedure.  Total Moderate Sedation Time: 25 min  PROCEDURE: The procedure, risks, benefits, and alternatives were explained to the patient. Questions regarding the procedure were encouraged and answered. The patient understands and consents to the procedure.  The right upper abdomen was prepped with chlorhexidine. A sterile drape was placed. The skin and liver capsule were anesthetized with 1% lidocaine. A 17 gauge needle was directed into the posterior right hepatic lesion and tried to aspirate fluid from the 17 gauge needle. No purulent fluid was obtained. A small amount of bloody fluid was collected. Three core biopsies were obtained with an 18 gauge device. Specimens were placed in  formalin. Needle removed without complication.  COMPLICATIONS: None.  FINDINGS: Heterogeneous hypoechoic lesion in the posterior right hepatic lobe. Needle placement was confirmed within the lesion. Following the first core biopsy, there was development of multiple echogenic foci throughout the lesion.  Echogenic foci probably represent gas from the biliary system. No significant bleeding.  IMPRESSION: Ultrasound-guided core biopsies of the liver lesion. Solid core material was obtained from the biopsies and no purulent fluid could be obtained. Findings are suspicious for recurrent cancer rather than abscess. A sample of the aspirated blood was sent for Gram stain and culture.   Electronically Signed   By: Markus Daft M.D.   On: 12/04/2013 16:36    EKG Interpretation    Date/Time:    Ventricular Rate:    PR Interval:    QRS Duration:   QT Interval:    QTC Calculation:   R Axis:     Text Interpretation:              MDM   1. SIRS (systemic inflammatory response syndrome)   2. Fever   3. Lactic acidosis   4. Malignant neoplasm of extrahepatic bile ducts   5. Sepsis   6. Family history of malignant neoplasm of gastrointestinal tract   7. History of Roux-en-Y gastric bypass   8. FUO (fever of unknown origin)   9. Bile duct cancer   10. Liver mass   11. Liver abscess   12. Screening for STD (sexually transmitted disease)     Patient with fever. Lactic acidosis. Patient with cough, however no pneumonia on x-ray.  urinalysis no UTI. patient is overall well-appearing. Will admit to internal medicine. Has had recent admission for fever also with negative urine cultures. Has a liver mass, the patient states they don't know if it is a recurrence of the cancer but states she's been told it is not an abscess.   Jasper Riling. Alvino Chapel, MD 12/06/13 [phone]

## 2013-12-04 ENCOUNTER — Inpatient Hospital Stay (HOSPITAL_COMMUNITY): Payer: BC Managed Care – PPO

## 2013-12-04 DIAGNOSIS — R6510 Systemic inflammatory response syndrome (SIRS) of non-infectious origin without acute organ dysfunction: Secondary | ICD-10-CM

## 2013-12-04 LAB — GLUCOSE, CAPILLARY
GLUCOSE-CAPILLARY: 109 mg/dL — AB (ref 70–99)
Glucose-Capillary: 145 mg/dL — ABNORMAL HIGH (ref 70–99)
Glucose-Capillary: 172 mg/dL — ABNORMAL HIGH (ref 70–99)

## 2013-12-04 LAB — CBC
HCT: 31.1 % — ABNORMAL LOW (ref 36.0–46.0)
HEMOGLOBIN: 9.8 g/dL — AB (ref 12.0–15.0)
MCH: 28.2 pg (ref 26.0–34.0)
MCHC: 31.5 g/dL (ref 30.0–36.0)
MCV: 89.6 fL (ref 78.0–100.0)
Platelets: 249 10*3/uL (ref 150–400)
RBC: 3.47 MIL/uL — ABNORMAL LOW (ref 3.87–5.11)
RDW: 15.3 % (ref 11.5–15.5)
WBC: 23.2 10*3/uL — AB (ref 4.0–10.5)

## 2013-12-04 LAB — HEPATIC FUNCTION PANEL
ALBUMIN: 2.2 g/dL — AB (ref 3.5–5.2)
ALK PHOS: 131 U/L — AB (ref 39–117)
ALT: 13 U/L (ref 0–35)
AST: 14 U/L (ref 0–37)
Bilirubin, Direct: <0.2 mg/dL (ref 0.0–0.3)
Total Bilirubin: 0.2 mg/dL — ABNORMAL LOW (ref 0.3–1.2)
Total Protein: 5.7 g/dL — ABNORMAL LOW (ref 6.0–8.3)

## 2013-12-04 LAB — BASIC METABOLIC PANEL
BUN: 11 mg/dL (ref 6–23)
CHLORIDE: 105 meq/L (ref 96–112)
CO2: 23 mEq/L (ref 19–32)
Calcium: 8.3 mg/dL — ABNORMAL LOW (ref 8.4–10.5)
Creatinine, Ser: 1.02 mg/dL (ref 0.50–1.10)
GFR calc non Af Amer: 59 mL/min — ABNORMAL LOW (ref >90)
GFR, EST AFRICAN AMERICAN: 69 mL/min — AB (ref >90)
Glucose, Bld: 169 mg/dL — ABNORMAL HIGH (ref 70–99)
POTASSIUM: 4.1 meq/L (ref 3.7–5.3)
SODIUM: 141 meq/L (ref 137–147)

## 2013-12-04 LAB — PROTIME-INR
INR: 1.02 (ref 0.00–1.49)
PROTHROMBIN TIME: 13.2 s (ref 11.6–15.2)

## 2013-12-04 LAB — APTT: APTT: 41 s — AB (ref 24–37)

## 2013-12-04 MED ORDER — GUAIFENESIN-DM 100-10 MG/5ML PO SYRP
5.0000 mL | ORAL_SOLUTION | ORAL | Status: DC | PRN
  Administered 2013-12-04 – 2013-12-06 (×6): 5 mL via ORAL
  Filled 2013-12-04 (×6): qty 10

## 2013-12-04 MED ORDER — MIDAZOLAM HCL 2 MG/2ML IJ SOLN
INTRAMUSCULAR | Status: AC | PRN
  Administered 2013-12-04: 2 mg via INTRAVENOUS

## 2013-12-04 MED ORDER — MIDAZOLAM HCL 2 MG/2ML IJ SOLN
INTRAMUSCULAR | Status: AC
  Filled 2013-12-04: qty 4

## 2013-12-04 MED ORDER — FENTANYL CITRATE 0.05 MG/ML IJ SOLN
INTRAMUSCULAR | Status: AC
  Filled 2013-12-04: qty 4

## 2013-12-04 MED ORDER — FLUMAZENIL 0.5 MG/5ML IV SOLN
INTRAVENOUS | Status: AC
  Filled 2013-12-04: qty 5

## 2013-12-04 MED ORDER — NALOXONE HCL 0.4 MG/ML IJ SOLN
INTRAMUSCULAR | Status: AC
  Filled 2013-12-04: qty 1

## 2013-12-04 MED ORDER — ENOXAPARIN SODIUM 40 MG/0.4ML ~~LOC~~ SOLN
40.0000 mg | Freq: Every day | SUBCUTANEOUS | Status: DC
Start: 2013-12-04 — End: 2013-12-14
  Administered 2013-12-04 – 2013-12-13 (×8): 40 mg via SUBCUTANEOUS
  Filled 2013-12-04 (×11): qty 0.4

## 2013-12-04 MED ORDER — FENTANYL CITRATE 0.05 MG/ML IJ SOLN
INTRAMUSCULAR | Status: AC | PRN
  Administered 2013-12-04: 100 ug via INTRAVENOUS

## 2013-12-04 NOTE — Progress Notes (Signed)
PROGRESS NOTE  Courtney Stevens GGY:[id number] DOB: August 17, 1956 DOA: 12/03/2013 PCP: [name], MD  Assessment/Plan: Sepsis - Patient met criteria for sepsis on admission given leukocytosis, fever, RR>20, , tachycardia and lactic acidosis; sepsis physiology improved this morning. Blood cultures pending, NGTD. - continue Vancomycin and Cefepime for now.  - IR consulted for sampling liver mass/abscess. - ID consulted, appreciate input.  Malignant neoplasm of extrahepatic bile ducts  - follows with Dr. Charlton Amor. Will add in Epic.  DM  Hold home medications. We'll place on sliding scale insulin  Pancreatic insufficiency  Continue pancreatic enzymes  Unspecified essential hypertension  Hold home blood pressure medications.  Hypothyroidism  Continue Synthroid  Diet: diabetic Fluids: NS DVT Prophylaxis: Lovenox  Code Status: Full Family Communication: none  Disposition Plan: inpatient  Consultants:  ID  IR  Procedures:  none   Antibiotics Vancomycin 1/16 >> Cefepime 1/16 >>  HPI/Subjective: - no complaints this morning, feeling much better.   Objective: Filed Vitals:   12/03/13 1400 12/03/13 1531 12/03/13 2115 12/04/13 0423  BP: 127/67 115/57 110/54 100/60  Pulse: 104 102 87 63  Temp:  98.8 F (37.1 C) 98.4 F (36.9 C) 97.8 F (36.6 C)  TempSrc:  Oral Oral Oral  Resp: _0 Height:  _1  (1.702 m)    Weight:  70.761 kg (156 lb)    SpO2: 98% 98% 99% 99%    Intake/Output Summary (Last 24 hours) at 12/04/13 0917 Last data filed at 12/04/13 0700  Gross per 24 hour  Intake   1730 ml  Output   1600 ml  Net    130 ml   Filed Weights   12/03/13 1531  Weight: 70.761 kg (156 lb)    Exam:  General:  NAD  Cardiovascular: regular rate and rhythm, soft 2/6 SEM  Respiratory: good air movement, clear to auscultation throughout, no wheezing, ronchi or rales  Abdomen: soft, RUQ and epigatric tenderness to palpation, positive bowel  sounds  MSK: no peripheral edema  Neuro: non focal  Data Reviewed: Basic Metabolic Panel:  Recent Labs Lab 11/28/13 0350 11/29/13 0428 11/30/13 0545 12/03/13 1315 12/04/13 0510  NA 138 137 137 137 141  K 3.6* 4.0 3.8 3.5* 4.1  CL 102 103 101 96 105  CO2 _2 GLUCOSE 134* 161* 181* 176* 169*  BUN _3 CREATININE 0.98 0.94 1.06 0.91 1.02  CALCIUM 8.2* 8.3* 8.4 8.8 8.3*   Liver Function Tests:  Recent Labs Lab 12/04/13 0510  AST 14  ALT 13  ALKPHOS 131*  BILITOT 0.2*  PROT 5.7*  ALBUMIN 2.2*   CBC:  Recent Labs Lab 11/28/13 0350 11/29/13 0428 11/30/13 0545 12/03/13 1315 12/04/13 0510  WBC 6.7 6.5 8.6 16.5* 23.2*  NEUTROABS  --   --   --  15.7*  --   HGB 11.3* 10.7* 10.7* 10.2* 9.8*  HCT 34.6* 33.5* 32.7* 31.5* 31.1*  MCV 87.4 88.4 87.7 89.0 89.6  PLT 219 216 226 261 249   CBG:  Recent Labs Lab 11/30/13 0732 11/30/13 1207 12/03/13 1710 12/03/13 2117 12/04/13 0733  GLUCAP 154* 137* 135* 213* 145*    Recent Results (from the past 240 hour(s))  CULTURE, BLOOD (ROUTINE X 2)     Status: None   Collection Time    11/25/13  4:25 PM      Result Value Range Status   Specimen Description BLOOD RIGHT ARM   Final  Special Requests BOTTLES DRAWN AEROBIC AND ANAEROBIC 5CC   Final   Culture  Setup Time     Final   Value: 11/25/2013 21:04     Performed at [facility]   Culture     Final   Value: NO GROWTH 5 DAYS     Performed at [facility]   Report Status 12/01/2013 FINAL   Final  CULTURE, BLOOD (ROUTINE X 2)     Status: None   Collection Time    11/25/13  4:30 PM      Result Value Range Status   Specimen Description BLOOD LEFT FOREARM   Final   Special Requests BOTTLES DRAWN AEROBIC AND ANAEROBIC 5CC   Final   Culture  Setup Time     Final   Value: 11/25/2013 21:02     Performed at [facility]   Culture     Final   Value: NO GROWTH 5 DAYS     Performed at [facility]   Report Status  12/01/2013 FINAL   Final  URINE CULTURE     Status: None   Collection Time    11/25/13  5:38 PM      Result Value Range Status   Specimen Description URINE, CLEAN CATCH   Final   Special Requests NONE   Final   Culture  Setup Time     Final   Value: 11/26/2013 00:22     Performed at [place]     Final   Value: 20,OOO COLONIES/ML     Performed at [facility]   Culture     Final   Value: Multiple bacterial morphotypes present, none predominant. Suggest appropriate recollection if clinically indicated.     Performed at [facility]   Report Status 11/28/2013 FINAL   Final  MRSA PCR SCREENING     Status: None   Collection Time    11/26/13  3:17 AM      Result Value Range Status   MRSA by PCR NEGATIVE  NEGATIVE Final   Comment:            The GeneXpert MRSA Assay (FDA     approved for NASAL specimens     only), is one component of a     comprehensive MRSA colonization     surveillance program. It is not     intended to diagnose MRSA     infection nor to guide or     monitor treatment for     MRSA infections.     Studies: Dg Chest 2 View  12/03/2013   CLINICAL DATA:  Fever, cough.  EXAM: CHEST  2 VIEW  COMPARISON:  November 25, 2013.  FINDINGS: The heart size and mediastinal contours are within normal limits. Both lungs are clear. No pleural effusion or pneumothorax is noted. The visualized skeletal structures are unremarkable.  IMPRESSION: No active cardiopulmonary disease.   Electronically Signed   By: [name] M.D.   On: 12/03/2013 14:14   Ct Abdomen Pelvis W Contrast  12/03/2013   CLINICAL DATA:  History of Whipple procedure for bile duct carcinoma. New sepsis. New liver lesion  EXAM: CT ABDOMEN AND PELVIS WITH CONTRAST  TECHNIQUE: Multidetector CT imaging of the abdomen and pelvis was performed using the standard protocol following bolus administration of intravenous contrast.  CONTRAST:  144m OMNIPAQUE IOHEXOL 300 MG/ML  SOLN   COMPARISON:  MR ABDOMEN WO/W CM dated 11/25/2013  FINDINGS: Lung bases are clear.  No pericardial fluid.  Again demonstrated a large subcapsular lesion within the posterior right hepatic lobe measuring 4.5 x 4.5 cm similar to 4.8 x 4.3 cm on comparison MRI. This lesion continues demonstrate mild peripheral enhancement and central heterogeneity. No additional hepatic lesions. There is pneumobilia in the left hepatic lobe consistent with choledochojejunostomy. There is some edema involving the bowel along the porta hepatis and small amount of fluid small of fluid along Morrison's pouch is similar prior.  Small nodule inferior right hepatic lobe measuring 8 mm unchanged (image 31, series 2 ). The body and tail of the pancreas normal. The spleen, adrenal glands, kidneys are normal. There is some cortical thinning on the right.  Small bowel and colon are unremarkable. There is no evidence of abscess in the pelvis.  Post hysterectomy anatomy. The bladder is normal. No pelvic lymphadenopathy. No aggressive osseous lesion.  IMPRESSION: 1. No change in large enhancing lesion the posterior aspect of the right hepatic lobe. In patient with sepsis of unknown origin would consider hepatic abscess as a distinct possibility although adenocarcinoma recurrence cannot be excluded. Recommend ultrasound guided percutaneous biopsy of this lesion to differentiate between abscess and malignancy. Favor abscess. 2. Edema along the proximal bowel and Morrison's pouch likely related to the hepatic process. 3. Patient status post a Whipple procedure without complicating features. This was made a call report.   Electronically Signed   By: Suzy Bouchard M.D.   On: 12/03/2013 20:18    Scheduled Meds: . sodium chloride   Intravenous STAT  . calcium-vitamin D  1 tablet Oral TID WC  . ceFEPime (MAXIPIME) IV  2 g Intravenous Q12H  . enoxaparin (LOVENOX) injection  40 mg Subcutaneous Q24H  . furosemide  40 mg Oral BID  . insulin aspart  0-15  Units Subcutaneous TID WC  . iron polysaccharides  150 mg Oral Daily  . levothyroxine  100 mcg Oral Q48H  . [START ON 12/05/2013] levothyroxine  200 mcg Oral Q48H  . lipase/protease/amylase  1 capsule Oral TID AC  . loratadine  10 mg Oral Daily  . magnesium oxide  200 mg Oral TID PC & HS  . multivitamin with minerals  1 tablet Oral Daily  . pantoprazole  40 mg Oral Daily  . potassium chloride SA  20 mEq Oral BID  . sodium chloride  3 mL Intravenous Q12H  . vancomycin  750 mg Intravenous Q12H  . Vitamin D (Ergocalciferol)  50,000 Units Oral QPM   Continuous Infusions: . sodium chloride 100 mL/hr at 12/04/13 0400    Principal Problem:   SIRS (systemic inflammatory response syndrome) Active Problems:   Malignant neoplasm of extrahepatic bile ducts   DM   PANCREATIC INSUFFICIENCY   Unspecified essential hypertension   Intrinsic asthma   History of Roux-en-Y gastric bypass   Fever   Hypothyroidism   Lactic acidosis   Sepsis   Time spent: [place], MD [facility] Pager [phone]. If 7 PM - 7 AM, please contact night-coverage at [email or web address], password [email or web address] 12/04/2013, 9:17 AM  LOS: 1 day

## 2013-12-04 NOTE — Progress Notes (Signed)
Patient ID: Courtney Stevens, female   DOB: Sep 22, 1956, 58 y.o.   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 Imaging studies were reviewed by Dr. Anselm Pancoast. Additional PMH as below. Exam: pt awake/alert; chest- CTA bilat; heart- RRR; abd- soft,+BS, tender RUQ/RLQ/lower pelvic regions; ext- FROM, no sig edema.    Filed Vitals:   12/03/13 1400 12/03/13 1531 12/03/13 2115 12/04/13 0423  BP: 127/67 115/57 110/54 100/60  Pulse: 104 102 87 63  Temp:  98.8 F (37.1 C) 98.4 F (36.9 C) 97.8 F (36.6 C)  TempSrc:  Oral Oral Oral  Resp: 17 18 18 17   Height:  5\' 7"  (1.702 m)    Weight:  156 lb (70.761 kg)    SpO2: 98% 98% 99% 99%   Past Medical History  Diagnosis Date  . Cancer     bile duct ca  . GERD (gastroesophageal reflux disease)   . Diabetes mellitus   . Hypertension   . Hypothyroid   . Hyperlipidemia   . Asthma    Past Surgical History  Procedure Laterality Date  . Gastric bypass  2004  . Cholecystectomy  1992  . Bile duct resection  2009  . Appendectomy    . Tonsillectomy    . Abdominal hysterectomy  1990    BIL OOPHORECTOMY  . Tubal ligation    . Breast surgery      BIOPSY NEG  Dg Chest 2 View  12/03/2013   CLINICAL DATA:  Fever, cough.  EXAM: CHEST  2 VIEW  COMPARISON:  November 25, 2013.  FINDINGS: The heart size and mediastinal contours are within normal limits. Both lungs are clear. No pleural effusion or pneumothorax is noted. The visualized skeletal structures are unremarkable.  IMPRESSION: No active cardiopulmonary disease.   Electronically Signed   By: [name] M.D.   On: 12/03/2013 14:14   Dg Chest 2 View  11/25/2013   CLINICAL DATA:  Mental status change, fever and chills, history of biliary tract cancer  EXAM: CHEST  2 VIEW  COMPARISON:  CT scan of this thorax dated November 19, 2013 and chest x-ray of the same day.  FINDINGS: The lungs are adequately inflated. There is no focal infiltrate. There is mild symmetric prominence of the pulmonary interstitial markings bilaterally. There is no pleural effusion. The cardiopericardial silhouette is normal in size. The pulmonary vascularity is not engorged. The mediastinum is normal in width very there is no pleural effusion. The observed portions of the bony thorax exhibit no acute abnormalities. There are surgical clips in the left upper quadrant of the abdomen.  IMPRESSION: There is no evidence of pneumonia nor CHF nor other acute cardiopulmonary abnormality. Mild prominence of the pulmonary interstitial markings bilaterally may be related to the patient's previous smoking history.   Electronically Signed   By: David  Martinique   On: 11/25/2013 16:18   Dg Chest 2 View  11/19/2013   CLINICAL DATA:  Shortness of breath and headache.  EXAM: CHEST  2 VIEW  COMPARISON:  03/02/2010  FINDINGS: Lungs are adequately inflated without consolidation or effusion. Cardiomediastinal silhouette is within normal. There is mild spondylosis of the spine. Multiple surgical clips over the abdomen.  IMPRESSION: No active cardiopulmonary disease.   Electronically Signed   By: Marin Olp M.D.   On: 11/19/2013 13:25  Ct Head Wo Contrast  11/19/2013   CLINICAL DATA:  Headache, no known injury; history of adenocarcinoma of the bile duct  EXAM: CT HEAD WITHOUT CONTRAST  TECHNIQUE: Contiguous axial images were obtained from the base of the skull through the vertex without intravenous contrast.  COMPARISON:  PET-CT -06/12/2009  FINDINGS: Gray-white differentiation is maintained. Incidental note is made of prominent VR spaces within the caudal medial aspects of the bilateral temporal lobes. No CT evidence for acute large territory infarct. No intraparenchymal or extra-axial mass or hemorrhage. Normal size and configuration of the ventricles and basilar cisterns. No  midline. 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 The remaining paranasal sinuses mastoid air cells appear normally aerated. Regional soft tissues appear normal. No displaced calvarial fracture.  IMPRESSION: Negative noncontrast head CT.   Electronically Signed   By: [name] M.D.   On: 11/19/2013 12:58   Ct Angio Chest Pe W/cm &/or Wo Cm  11/19/2013   CLINICAL DATA:  Shortness of breath, chest pain.  EXAM: CT ANGIOGRAPHY CHEST WITH CONTRAST  TECHNIQUE: Multidetector CT imaging of the chest was performed using the standard protocol during bolus administration of intravenous contrast. Multiplanar CT image reconstructions including MIPs were obtained to evaluate the vascular anatomy.  CONTRAST:  142mL OMNIPAQUE IOHEXOL 350 MG/ML SOLN  COMPARISON:  CT scan of Apr 14, 2013.  FINDINGS: No pleural effusion or pneumothorax is noted. No significant pulmonary parenchymal abnormality is noted. The thoracic aorta appears normal without evidence of dissection or aneurysm. Pulmonary arteries appear normal. There is no evidence of pulmonary embolus no significant mediastinal adenopathy is noted. Ill-defined low-density measuring 4 cm is seen in the posterior segment of the right hepatic lobe ; further evaluation with MRI is recommended. No osseous abnormality is noted in the chest  Review of the MIP images confirms the above findings.  IMPRESSION: No evidence of pulmonary embolus. No acute pulmonary disease is noted. 4 cm ill-defined low density is noted in the posterior segment of the right hepatic lobe concerning for possible neoplasm ; further evaluation with MRI is recommended.   Electronically Signed   By: [name] M.D.   On: 11/19/2013 14:58   Mr Abdomen W Wo Contrast  11/25/2013   CLINICAL DATA:  Abdominal pain with nausea, vomiting and fever. 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 Liver lesion  on CT.  EXAM: MRI ABDOMEN WITHOUT AND WITH CONTRAST  TECHNIQUE: Multiplanar multisequence MR imaging of the abdomen was performed both before and after the administration of intravenous contrast.  CONTRAST:  97mL MULTIHANCE GADOBENATE DIMEGLUMINE 529 MG/ML IV SOLN  COMPARISON:  CT ABD/PELVIS W CM dated 02/19/2013; CT ABD/PELVIS W CM dated 08/14/2012; MR ABDOMEN WO/W CM dated 03/06/2010; CT ANGIO CHEST W/CM &/OR WO/CM dated 11/19/2013  FINDINGS: The previously demonstrated diffuse severe hepatic steatosis has improved. Medially within the right hepatic lobe at the site of the previous geographic sparing of steatosis is a new heterogeneous mass. This demonstrates peripheral heterogeneous enhancement following contrast, measuring approximately 5.6 x 3.7 cm transverse on image 23 of series 1201. There is inferior extension of tumor along the posteromedial hepatic capsule. There is extrinsic mass effect on the IVC which is narrowed, but patent. The hepatic and portal veins are patent. The dominant liver lesion demonstrates central cystic or necrotic components which do not enhance.  No other focal liver lesions are identified. There is stable mild  intrahepatic biliary dilatation status post choledochojejunostomy. There is stable atrophy of the pancreatic body and tail. Soft tissue stranding and enhancement surrounding the duodenum and IVC are similar to the prior examination, likely postsurgical in etiology.  The spleen, adrenal glands and kidneys demonstrate no significant findings. There is no hydronephrosis. There is upper abdominal adenopathy within the portacaval space and retroperitoneum. There is a 1.4 cm aortic caval node which demonstrates central necrosis. There is a new enhancing 11 mm nodule inferior to the right hepatic lobe which may reflect a small peritoneal implant. No significant ascites is seen.  IMPRESSION: 1. As demonstrated on recent Chest CT, there is a new peripherally enhancing mass posteromedially  within the right hepatic lobe, highly worrisome for local recurrence or metastatic disease from the patient's bile duct adenocarcinoma. A liver abscess could have this appearance, although that possibility is considered much less likely. 2. Associated necrotic upper abdominal adenopathy, also worrisome for metastatic disease. Possible small peritoneal implant adjacent to the right hepatic lobe. 3. No significant biliary dilatation. 4. Extrinsic mass effect on the IVC without evidence of occlusion.   Electronically Signed   By: Camie Patience M.D.   On: 11/25/2013 18:37   Ct Abdomen Pelvis W Contrast  12/03/2013   CLINICAL DATA:  History of Whipple procedure for bile duct carcinoma. New sepsis. New liver lesion  EXAM: CT ABDOMEN AND PELVIS WITH CONTRAST  TECHNIQUE: Multidetector CT imaging of the abdomen and pelvis was performed using the standard protocol following bolus administration of intravenous contrast.  CONTRAST:  138mL OMNIPAQUE IOHEXOL 300 MG/ML  SOLN  COMPARISON:  MR ABDOMEN WO/W CM dated 11/25/2013  FINDINGS: Lung bases are clear.  No pericardial fluid.  Again demonstrated a large subcapsular lesion within the posterior right hepatic lobe measuring 4.5 x 4.5 cm similar to 4.8 x 4.3 cm on comparison MRI. This lesion continues demonstrate mild peripheral enhancement and central heterogeneity. No additional hepatic lesions. There is pneumobilia in the left hepatic lobe consistent with choledochojejunostomy. There is some edema involving the bowel along the porta hepatis and small amount of fluid small of fluid along Morrison's pouch is similar prior.  Small nodule inferior right hepatic lobe measuring 8 mm unchanged (image 31, series 2 ). The body and tail of the pancreas normal. The spleen, adrenal glands, kidneys are normal. There is some cortical thinning on the right.  Small bowel and colon are unremarkable. There is no evidence of abscess in the pelvis.  Post hysterectomy anatomy. The bladder is normal.  No pelvic lymphadenopathy. No aggressive osseous lesion.  IMPRESSION: 1. No change in large enhancing lesion the posterior aspect of the right hepatic lobe. In patient with sepsis of unknown origin would consider hepatic abscess as a distinct possibility although adenocarcinoma recurrence cannot be excluded. Recommend ultrasound guided percutaneous biopsy of this lesion to differentiate between abscess and malignancy. Favor abscess. 2. Edema along the proximal bowel and Morrison's pouch likely related to the hepatic process. 3. Patient status post a Whipple procedure without complicating features. This was made a call report.   Electronically Signed   By: Suzy Bouchard M.D.   On: 12/03/2013 20:18  Results for orders placed during the hospital encounter of 02/54/27  BASIC METABOLIC PANEL      Result Value Range   Sodium 137  137 - 147 mEq/L   Potassium 3.5 (*) 3.7 - 5.3 mEq/L   Chloride 96  96 - 112 mEq/L   CO2 25  19 - 32 mEq/L  Glucose, Bld 176 (*) 70 - 99 mg/dL   BUN 12  6 - 23 mg/dL   Creatinine, Ser 0.91  0.50 - 1.10 mg/dL   Calcium 8.8  8.4 - 10.5 mg/dL   GFR calc non Af Amer 68 (*) >90 mL/min   GFR calc Af Amer 79 (*) >90 mL/min  CBC WITH DIFFERENTIAL      Result Value Range   WBC 16.5 (*) 4.0 - 10.5 K/uL   RBC 3.54 (*) 3.87 - 5.11 MIL/uL   Hemoglobin 10.2 (*) 12.0 - 15.0 g/dL   HCT 31.5 (*) 36.0 - 46.0 %   MCV 89.0  78.0 - 100.0 fL   MCH 28.8  26.0 - 34.0 pg   MCHC 32.4  30.0 - 36.0 g/dL   RDW 14.8  11.5 - 15.5 %   Platelets 261  150 - 400 K/uL   Neutrophils Relative % 95 (*) 43 - 77 %   Neutro Abs 15.7 (*) 1.7 - 7.7 K/uL   Lymphocytes Relative 3 (*) 12 - 46 %   Lymphs Abs 0.4 (*) 0.7 - 4.0 K/uL   Monocytes Relative 2 (*) 3 - 12 %   Monocytes Absolute 0.4  0.1 - 1.0 K/uL   Eosinophils Relative 0  0 - 5 %   Eosinophils Absolute 0.0  0.0 - 0.7 K/uL   Basophils Relative 0  0 - 1 %   Basophils Absolute 0.0  0.0 - 0.1 K/uL  URINALYSIS, ROUTINE W REFLEX MICROSCOPIC       Result Value Range   Color, Urine YELLOW  YELLOW   APPearance CLEAR  CLEAR   Specific Gravity, Urine 1.009  1.005 - 1.030   pH 5.5  5.0 - 8.0   Glucose, UA NEGATIVE  NEGATIVE mg/dL   Hgb urine dipstick NEGATIVE  NEGATIVE   Bilirubin Urine NEGATIVE  NEGATIVE   Ketones, ur NEGATIVE  NEGATIVE mg/dL   Protein, ur NEGATIVE  NEGATIVE mg/dL   Urobilinogen, UA 0.2  0.0 - 1.0 mg/dL   Nitrite NEGATIVE  NEGATIVE   Leukocytes, UA NEGATIVE  NEGATIVE  INFLUENZA PANEL BY PCR (TYPE A & B, H1N1)      Result Value Range   Influenza A By PCR NEGATIVE  NEGATIVE   Influenza B By PCR NEGATIVE  NEGATIVE   H1N1 flu by pcr NOT DETECTED  NOT DETECTED  BASIC METABOLIC PANEL      Result Value Range   Sodium 141  137 - 147 mEq/L   Potassium 4.1  3.7 - 5.3 mEq/L   Chloride 105  96 - 112 mEq/L   CO2 23  19 - 32 mEq/L   Glucose, Bld 169 (*) 70 - 99 mg/dL   BUN 11  6 - 23 mg/dL   Creatinine, Ser 1.02  0.50 - 1.10 mg/dL   Calcium 8.3 (*) 8.4 - 10.5 mg/dL   GFR calc non Af Amer 59 (*) >90 mL/min   GFR calc Af Amer 69 (*) >90 mL/min  CBC      Result Value Range   WBC 23.2 (*) 4.0 - 10.5 K/uL   RBC 3.47 (*) 3.87 - 5.11 MIL/uL   Hemoglobin 9.8 (*) 12.0 - 15.0 g/dL   HCT 31.1 (*) 36.0 - 46.0 %   MCV 89.6  78.0 - 100.0 fL   MCH 28.2  26.0 - 34.0 pg   MCHC 31.5  30.0 - 36.0 g/dL   RDW 15.3  11.5 - 15.5 %   Platelets 249  150 - 400 K/uL  HEPATIC FUNCTION PANEL      Result Value Range   Total Protein 5.7 (*) 6.0 - 8.3 g/dL   Albumin 2.2 (*) 3.5 - 5.2 g/dL   AST 14  0 - 37 U/L   ALT 13  0 - 35 U/L   Alkaline Phosphatase 131 (*) 39 - 117 U/L   Total Bilirubin 0.2 (*) 0.3 - 1.2 mg/dL   Bilirubin, Direct <0.2  0.0 - 0.3 mg/dL   Indirect Bilirubin NOT CALCULATED  0.3 - 0.9 mg/dL  GLUCOSE, CAPILLARY      Result Value Range   Glucose-Capillary 135 (*) 70 - 99 mg/dL  GLUCOSE, CAPILLARY      Result Value Range   Glucose-Capillary 213 (*) 70 - 99 mg/dL  GLUCOSE, CAPILLARY      Result Value Range    Glucose-Capillary 145 (*) 70 - 99 mg/dL  CG4 I-STAT (LACTIC ACID)      Result Value Range   Lactic Acid, Venous 4.28 (*) 0.5 - 2.2 mmol/L   A/P:Pt with sig past hx of bile duct cancer and recent fevers, chills, cough, UTI, abdominal pain , rising WBC count and imaging studies revealing large enhancing lesion in post aspect of right hepatic lobe suspicious for metastatic disease vs abscess. Plan is for US/CT guided aspiration/possible drainage of hepatic lesion/? abscess today. Details/risks of procedure d/w pt with her understanding and consent.

## 2013-12-04 NOTE — Procedures (Signed)
US guided biopsy of the liver lesion.  Only a small amount of blood could be aspirated, no purulent fluid.  3 cores obtained with 18 gauge needle.  No immediate complication.  Sedation: Versed 2 mg, Fentanyl 100 mcg, sedation time = 25 minutes.

## 2013-12-05 DIAGNOSIS — R509 Fever, unspecified: Secondary | ICD-10-CM

## 2013-12-05 DIAGNOSIS — Z8509 Personal history of malignant neoplasm of other digestive organs: Secondary | ICD-10-CM

## 2013-12-05 DIAGNOSIS — K7689 Other specified diseases of liver: Secondary | ICD-10-CM

## 2013-12-05 LAB — CBC WITH DIFFERENTIAL/PLATELET
Basophils Absolute: 0 10*3/uL (ref 0.0–0.1)
Basophils Relative: 0 % (ref 0–1)
EOS ABS: 0.1 10*3/uL (ref 0.0–0.7)
Eosinophils Relative: 1 % (ref 0–5)
HEMATOCRIT: 33.7 % — AB (ref 36.0–46.0)
HEMOGLOBIN: 10.8 g/dL — AB (ref 12.0–15.0)
Lymphocytes Relative: 6 % — ABNORMAL LOW (ref 12–46)
Lymphs Abs: 0.9 10*3/uL (ref 0.7–4.0)
MCH: 28.5 pg (ref 26.0–34.0)
MCHC: 32 g/dL (ref 30.0–36.0)
MCV: 88.9 fL (ref 78.0–100.0)
MONO ABS: 0.7 10*3/uL (ref 0.1–1.0)
MONOS PCT: 5 % (ref 3–12)
Neutro Abs: 12 10*3/uL — ABNORMAL HIGH (ref 1.7–7.7)
Neutrophils Relative %: 88 % — ABNORMAL HIGH (ref 43–77)
Platelets: 275 10*3/uL (ref 150–400)
RBC: 3.79 MIL/uL — ABNORMAL LOW (ref 3.87–5.11)
RDW: 14.9 % (ref 11.5–15.5)
WBC: 13.7 10*3/uL — ABNORMAL HIGH (ref 4.0–10.5)

## 2013-12-05 LAB — GLUCOSE, CAPILLARY
GLUCOSE-CAPILLARY: 254 mg/dL — AB (ref 70–99)
Glucose-Capillary: 241 mg/dL — ABNORMAL HIGH (ref 70–99)
Glucose-Capillary: 167 mg/dL — ABNORMAL HIGH (ref 70–99)
Glucose-Capillary: 212 mg/dL — ABNORMAL HIGH (ref 70–99)
Glucose-Capillary: 206 mg/dL — ABNORMAL HIGH (ref 70–99)

## 2013-12-05 LAB — COMPREHENSIVE METABOLIC PANEL
ALT: 15 U/L (ref 0–35)
AST: 19 U/L (ref 0–37)
Albumin: 2.4 g/dL — ABNORMAL LOW (ref 3.5–5.2)
Alkaline Phosphatase: 123 U/L — ABNORMAL HIGH (ref 39–117)
BUN: 10 mg/dL (ref 6–23)
CO2: 25 meq/L (ref 19–32)
Calcium: 8.6 mg/dL (ref 8.4–10.5)
Chloride: 97 mEq/L (ref 96–112)
Creatinine, Ser: 1.07 mg/dL (ref 0.50–1.10)
GFR calc Af Amer: 65 mL/min — ABNORMAL LOW (ref >90)
GFR, EST NON AFRICAN AMERICAN: 56 mL/min — AB (ref >90)
Glucose, Bld: 155 mg/dL — ABNORMAL HIGH (ref 70–99)
Potassium: 3.8 mEq/L (ref 3.7–5.3)
SODIUM: 136 meq/L — AB (ref 137–147)
TOTAL PROTEIN: 6.2 g/dL (ref 6.0–8.3)
Total Bilirubin: 0.3 mg/dL (ref 0.3–1.2)

## 2013-12-05 LAB — VANCOMYCIN, TROUGH: VANCOMYCIN TR: 12.5 ug/mL (ref 10.0–20.0)

## 2013-12-05 MED ORDER — VANCOMYCIN HCL IN DEXTROSE 1-5 GM/200ML-% IV SOLN
1000.0000 mg | Freq: Two times a day (BID) | INTRAVENOUS | Status: DC
  Administered 2013-12-06 – 2013-12-09 (×7): 1000 mg via INTRAVENOUS
  Filled 2013-12-05 (×8): qty 200

## 2013-12-05 MED ORDER — METRONIDAZOLE 500 MG PO TABS
500.0000 mg | ORAL_TABLET | Freq: Three times a day (TID) | ORAL | Status: DC
Start: 2013-12-05 — End: 2013-12-09
  Administered 2013-12-05 – 2013-12-09 (×13): 500 mg via ORAL
  Filled 2013-12-05 (×15): qty 1

## 2013-12-05 NOTE — Progress Notes (Signed)
PROGRESS NOTE  Courtney Stevens GXE:[id number] DOB: 11/03/1956 DOA: 12/03/2013 PCP: [name], MD  Assessment/Plan: Sepsis - Patient met criteria for sepsis on admission given leukocytosis, fever, RR>20, tachycardia and lactic acidosis; sepsis physiology improved this morning. Blood cultures pending, NGTD. Continue Vancomycin and Cefepime for now.  - IR consulted for sampling liver mass/abscess s/p biopsy on 1/17, without frank puss and based on appearance likely cancer. ID consulted, appreciate input since there is no clear source. Mild cough, will send sputum.  Malignant neoplasm of extrahepatic bile ducts - follows with Dr. Charlton Amor. Will add in Epic. New mass biopsied, awaiting pathology.  DM Hold home medications. We'll place on sliding scale insulin  Pancreatic insufficiency Continue pancreatic enzymes  Unspecified essential hypertension Hold home blood pressure medications.  Hypothyroidism Continue Synthroid  Diet: diabetic Fluids: NS DVT Prophylaxis: Lovenox  Code Status: Full Family Communication: none  Disposition Plan: inpatient  Consultants:  ID  IR  Procedures:  none   Antibiotics Vancomycin 1/16 >> Cefepime 1/16 >>  HPI/Subjective: - mild pain at the biopsy site.  Objective: Filed Vitals:   12/04/13 1730 12/04/13 1900 12/04/13 2128 12/05/13 0459  BP: 123/63  140/69 140/60  Pulse: 70  66 77  Temp:  98 F (36.7 C) 98.1 F (36.7 C) 99 F (37.2 C)  TempSrc:  Oral Oral Oral  Resp:   18 17  Height:      Weight:      SpO2:   100% 97%    Intake/Output Summary (Last 24 hours) at 12/05/13 0908 Last data filed at 12/05/13 0871  Gross per 24 hour  Intake 3478.33 ml  Output   3850 ml  Net -371.67 ml   Filed Weights   12/03/13 1531  Weight: 70.761 kg (156 lb)    Exam:  General:  NAD  Cardiovascular: regular rate and rhythm, soft 2/6 SEM  Respiratory: good air movement, clear to auscultation throughout, no wheezing, ronchi or  rales  Abdomen: soft, RUQ and epigatric tenderness to palpation, positive bowel sounds  MSK: no peripheral edema  Neuro: non focal  Data Reviewed: Basic Metabolic Panel:  Recent Labs Lab 11/29/13 0428 11/30/13 0545 12/03/13 1315 12/04/13 0510 12/05/13 0540  NA 137 137 137 141 136*  K 4.0 3.8 3.5* 4.1 3.8  CL 103 101 96 105 97  CO2 _0 GLUCOSE 161* 181* 176* 169* 155*  BUN _1 CREATININE 0.94 1.06 0.91 1.02 1.07  CALCIUM 8.3* 8.4 8.8 8.3* 8.6   Liver Function Tests:  Recent Labs Lab 12/04/13 0510 12/05/13 0540  AST 14 19  ALT 13 15  ALKPHOS 131* 123*  BILITOT 0.2* 0.3  PROT 5.7* 6.2  ALBUMIN 2.2* 2.4*   CBC:  Recent Labs Lab 11/29/13 0428 11/30/13 0545 12/03/13 1315 12/04/13 0510 12/05/13 0540  WBC 6.5 8.6 16.5* 23.2* 13.7*  NEUTROABS  --   --  15.7*  --  12.0*  HGB 10.7* 10.7* 10.2* 9.8* 10.8*  HCT 33.5* 32.7* 31.5* 31.1* 33.7*  MCV 88.4 87.7 89.0 89.6 88.9  PLT 216 226 261 249 275   CBG:  Recent Labs Lab 12/04/13 0733 12/04/13 1142 12/04/13 1644 12/04/13 2133 12/05/13 0751  GLUCAP 145* 172* 109* 241* 167*    Recent Results (from the past 240 hour(s))  CULTURE, BLOOD (ROUTINE X 2)     Status: None   Collection Time    11/25/13  4:25 PM      Result Value Range  Status   Specimen Description BLOOD RIGHT ARM   Final   Special Requests BOTTLES DRAWN AEROBIC AND ANAEROBIC 5CC   Final   Culture  Setup Time     Final   Value: 11/25/2013 21:04     Performed at [facility]   Culture     Final   Value: NO GROWTH 5 DAYS     Performed at [facility]   Report Status 12/01/2013 FINAL   Final  CULTURE, BLOOD (ROUTINE X 2)     Status: None   Collection Time    11/25/13  4:30 PM      Result Value Range Status   Specimen Description BLOOD LEFT FOREARM   Final   Special Requests BOTTLES DRAWN AEROBIC AND ANAEROBIC 5CC   Final   Culture  Setup Time     Final   Value: 11/25/2013 21:02     Performed at [facility]   Culture     Final   Value: NO GROWTH 5 DAYS     Performed at [facility]   Report Status 12/01/2013 FINAL   Final  URINE CULTURE     Status: None   Collection Time    11/25/13  5:38 PM      Result Value Range Status   Specimen Description URINE, CLEAN CATCH   Final   Special Requests NONE   Final   Culture  Setup Time     Final   Value: 11/26/2013 00:22     Performed at [place]     Final   Value: 20,OOO COLONIES/ML     Performed at [facility]   Culture     Final   Value: Multiple bacterial morphotypes present, none predominant. Suggest appropriate recollection if clinically indicated.     Performed at [facility]   Report Status 11/28/2013 FINAL   Final  MRSA PCR SCREENING     Status: None   Collection Time    11/26/13  3:17 AM      Result Value Range Status   MRSA by PCR NEGATIVE  NEGATIVE Final   Comment:            The GeneXpert MRSA Assay (FDA     approved for NASAL specimens     only), is one component of a     comprehensive MRSA colonization     surveillance program. It is not     intended to diagnose MRSA     infection nor to guide or     monitor treatment for     MRSA infections.  CULTURE, BLOOD (ROUTINE X 2)     Status: None   Collection Time    12/03/13  1:15 PM      Result Value Range Status   Specimen Description BLOOD RIGHT FOREARM  5 ML IN [facility] BOTTLE   Final   Special Requests NONE   Final   Culture  Setup Time     Final   Value: 12/03/2013 19:43     Performed at [facility]   Culture     Final   Value:        BLOOD CULTURE RECEIVED NO GROWTH TO DATE CULTURE WILL BE HELD FOR 5 DAYS BEFORE ISSUING A FINAL NEGATIVE REPORT     Performed at [facility]   Report Status PENDING   Incomplete  CULTURE, BLOOD (ROUTINE X 2)  Status: None   Collection Time    12/03/13  2:09 PM      Result Value Range Status   Specimen Description BLOOD RIGHT FOREARM  5 ML IN [facility] BOTTLE    Final   Special Requests NONE   Final   Culture  Setup Time     Final   Value: 12/03/2013 19:43     Performed at [facility]   Culture     Final   Value:        BLOOD CULTURE RECEIVED NO GROWTH TO DATE CULTURE WILL BE HELD FOR 5 DAYS BEFORE ISSUING A FINAL NEGATIVE REPORT     Performed at [facility]   Report Status PENDING   Incomplete     Studies: Dg Chest 2 View  12/03/2013   CLINICAL DATA:  Fever, cough.  EXAM: CHEST  2 VIEW  COMPARISON:  November 25, 2013.  FINDINGS: The heart size and mediastinal contours are within normal limits. Both lungs are clear. No pleural effusion or pneumothorax is noted. The visualized skeletal structures are unremarkable.  IMPRESSION: No active cardiopulmonary disease.   Electronically Signed   By: [name] M.D.   On: 12/03/2013 14:14   Ct Abdomen Pelvis W Contrast  12/03/2013   CLINICAL DATA:  History of Whipple procedure for bile duct carcinoma. New sepsis. New liver lesion  EXAM: CT ABDOMEN AND PELVIS WITH CONTRAST  TECHNIQUE: Multidetector CT imaging of the abdomen and pelvis was performed using the standard protocol following bolus administration of intravenous contrast.  CONTRAST:  153mL OMNIPAQUE IOHEXOL 300 MG/ML  SOLN  COMPARISON:  MR ABDOMEN WO/W CM dated 11/25/2013  FINDINGS: Lung bases are clear.  No pericardial fluid.  Again demonstrated a large subcapsular lesion within the posterior right hepatic lobe measuring 4.5 x 4.5 cm similar to 4.8 x 4.3 cm on comparison MRI. This lesion continues demonstrate mild peripheral enhancement and central heterogeneity. No additional hepatic lesions. There is pneumobilia in the left hepatic lobe consistent with choledochojejunostomy. There is some edema involving the bowel along the porta hepatis and small amount of fluid small of fluid along Morrison's pouch is similar prior.  Small nodule inferior right hepatic lobe measuring 8 mm unchanged (image 31, series 2 ). The body and tail of the pancreas  normal. The spleen, adrenal glands, kidneys are normal. There is some cortical thinning on the right.  Small bowel and colon are unremarkable. There is no evidence of abscess in the pelvis.  Post hysterectomy anatomy. The bladder is normal. No pelvic lymphadenopathy. No aggressive osseous lesion.  IMPRESSION: 1. No change in large enhancing lesion the posterior aspect of the right hepatic lobe. In patient with sepsis of unknown origin would consider hepatic abscess as a distinct possibility although adenocarcinoma recurrence cannot be excluded. Recommend ultrasound guided percutaneous biopsy of this lesion to differentiate between abscess and malignancy. Favor abscess. 2. Edema along the proximal bowel and Morrison's pouch likely related to the hepatic process. 3. Patient status post a Whipple procedure without complicating features. This was made a call report.   Electronically Signed   By: Suzy Bouchard M.D.   On: 12/03/2013 20:18   US Biopsy  12/04/2013   CLINICAL DATA:  58 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 Patient has an elevated white count and chills. Evaluate for recurrent cancer versus abscess.  EXAM: ULTRASOUND GUIDED BIOPSY OF THE LIVER LESION  MEDICATIONS: 2.0 mg IV Versed; 100 mcg IV Fentanyl. A radiology  nurse monitored the patient throughout the procedure.  Total Moderate Sedation Time: 25 min  PROCEDURE: The procedure, risks, benefits, and alternatives were explained to the patient. Questions regarding the procedure were encouraged and answered. The patient understands and consents to the procedure.  The right upper abdomen was prepped with chlorhexidine. A sterile drape was placed. The skin and liver capsule were anesthetized with 1% lidocaine. A 17 gauge needle was directed into the posterior right hepatic lesion and tried to aspirate fluid from the 17 gauge needle. No purulent fluid was obtained. A small amount of bloody fluid was collected. Three core biopsies  were obtained with an 18 gauge device. Specimens were placed in formalin. Needle removed without complication.  COMPLICATIONS: None.  FINDINGS: Heterogeneous hypoechoic lesion in the posterior right hepatic lobe. Needle placement was confirmed within the lesion. Following the first core biopsy, there was development of multiple echogenic foci throughout the lesion. Echogenic foci probably represent gas from the biliary system. No significant bleeding.  IMPRESSION: Ultrasound-guided core biopsies of the liver lesion. Solid core material was obtained from the biopsies and no purulent fluid could be obtained. Findings are suspicious for recurrent cancer rather than abscess. A sample of the aspirated blood was sent for Gram stain and culture.   Electronically Signed   By: Markus Daft M.D.   On: 12/04/2013 16:36    Scheduled Meds: . calcium-vitamin D  1 tablet Oral TID WC  . ceFEPime (MAXIPIME) IV  2 g Intravenous Q12H  . enoxaparin (LOVENOX) injection  40 mg Subcutaneous Q2200  . furosemide  40 mg Oral BID  . insulin aspart  0-15 Units Subcutaneous TID WC  . iron polysaccharides  150 mg Oral Daily  . levothyroxine  100 mcg Oral Q48H  . levothyroxine  200 mcg Oral Q48H  . lipase/protease/amylase  1 capsule Oral TID AC  . loratadine  10 mg Oral Daily  . magnesium oxide  200 mg Oral TID PC & HS  . multivitamin with minerals  1 tablet Oral Daily  . pantoprazole  40 mg Oral Daily  . potassium chloride SA  20 mEq Oral BID  . sodium chloride  3 mL Intravenous Q12H  . vancomycin  750 mg Intravenous Q12H  . Vitamin D (Ergocalciferol)  50,000 Units Oral QPM   Continuous Infusions: . sodium chloride 100 mL/hr at 12/04/13 0400    Principal Problem:   SIRS (systemic inflammatory response syndrome) Active Problems:   Malignant neoplasm of extrahepatic bile ducts   DM   PANCREATIC INSUFFICIENCY   Unspecified essential hypertension   Intrinsic asthma   History of Roux-en-Y gastric bypass   Fever    Hypothyroidism   Lactic acidosis   Sepsis  Time spent: Page, MD [facility] Pager [phone]. If 7 PM - 7 AM, please contact night-coverage at [email or web address], password [facility] 12/05/2013, 9:08 AM  LOS: 2 days

## 2013-12-05 NOTE — Consult Note (Signed)
[facility]    Date of Admission:  12/03/2013  Date of Consult:  12/05/2013  Reason for Consult:FUO, possible liver abscess vs cancer recurrence Referring Physician: Dr. Cruzita Lederer   HPI: Courtney Stevens is an 58 y.o. female.   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 During prior admissions there has been concern for UTI, though all recent urine cultures have been either negative or with very low colony counts. For example cultures on January 8 only grew 20,000 colonies of multiple morphotypes of every culture before that was negative and we go back into November when she did actually grow Klebsiella from urine. She did broadly covered while she was in the hospital with vancomycin and Zosyn was discharged to levofloxacin. After 2 days at home she again had recurrence of her fevers malaise and lower abdominal pain right upper quadrant pain and back pain. She had blood cultures drawn repeat urinalysis which was negative, chest x-ray was negative.  CT showed No change in large enhancing lesion the posterior aspect of the  right hepatic lobe, but now given her recent history start consideration was given to the idea that this might be a hepatic abscess.  She is undergone radiologic biopsy of this lesion. On biopsy was no purulent material but only bloody material and there is high suspicion for malignancy at this site.  She's been treated with vancomycin and cefepime since admission but remains febrile. We're consulted to help workup the cause of her fevers along with the lesion in her liver.    Past Medical History  Diagnosis Date  . Cancer     bile duct ca  . GERD (gastroesophageal reflux disease)   . Diabetes mellitus   . Hypertension   . Hypothyroid   .  Hyperlipidemia   . Asthma     Past Surgical History  Procedure Laterality Date  . Gastric bypass  2004  . Cholecystectomy  1992  . Bile duct resection  2009  . Appendectomy    . Tonsillectomy    . Abdominal hysterectomy  1990    BIL OOPHORECTOMY  . Tubal ligation    . Breast surgery      BIOPSY NEG  ergies:   Allergies  Allergen Reactions  . Ace Inhibitors Anaphylaxis  . Tilade [Nedocromil] Anaphylaxis  . Reglan [Metoclopramide] Nausea And Vomiting  . Betadine [Povidone Iodine] Rash    iching      Medications: I have reviewed patients current medications as documented in Epic Anti-infectives   Start     Dose/Rate Route Frequency Ordered Stop   12/05/13 1600  metroNIDAZOLE (FLAGYL) tablet 500 mg     500 mg Oral 3 times per day 12/05/13 1437     12/04/13 0800  vancomycin (VANCOCIN) IVPB 750 mg/150 ml premix     750 mg 150 mL/hr over 60 Minutes Intravenous Every 12 hours 12/03/13 1647     12/04/13 0600  ceFEPIme (MAXIPIME) 2 g in dextrose 5 % 50 mL IVPB     2 g 100 mL/hr over 30 Minutes Intravenous Every 12 hours 12/03/13 1646     12/03/13 1800  vancomycin (VANCOCIN) IVPB 1000 mg/200 mL premix     1,000 mg 200 mL/hr over 60 Minutes Intravenous  Once 12/03/13 1639 12/03/13  2005   12/03/13 1645  ceFEPIme (MAXIPIME) 2 g in dextrose 5 % 50 mL IVPB     2 g 100 mL/hr over 30 Minutes Intravenous NOW 12/03/13 1639 12/03/13 1841      Social History:  reports that she quit smoking about 36 years ago. Her smoking use included Cigarettes. She has a 10 pack-year smoking history. She has never used smokeless tobacco. She reports that she drinks alcohol. She reports that she does not use illicit drugs.  Family History  Problem Relation Age of Onset  . Colon cancer Mother   . Hypertension Mother   . Glaucoma Mother   . Diabetes Father   . Alzheimer's disease Father   . Heart attack Father   . Nephrolithiasis Father   . Emphysema Father     was a smoker  . Heart disease  Father   . Nephrolithiasis Sister   . Heart attack Brother   . Nephrolithiasis Brother     As in HPI and primary teams notes otherwise 12 point review of systems is negative  Blood pressure 100/60, pulse 78, temperature 98 F (36.7 C), temperature source Oral, resp. rate 20, height _0  (1.702 m), weight 156 lb (70.761 kg), SpO2 98.00%. General: Alert and awake, oriented x3, not in any acute distress. HEENT: anicteric sclera, pupils reactive to light and accommodation, EOMI, oropharynx clear and without exudate CVS regular rate, normal r,  no murmur rubs or gallops Chest: clear to auscultation bilaterally, no wheezing, rales or rhonchi Abdomen: soft minimal tenderness in her suprapubic area and right upper quadrant. nondistended, normal bowel sounds, Extremities: no  clubbing or edema noted bilaterally Skin: A few areas where she has bruising but otherwise no rashes Neuro: nonfocal, strength and sensation intact   Results for orders placed during the hospital encounter of 12/03/13 (from the past 48 hour(s))  GLUCOSE, CAPILLARY     Status: Abnormal   Collection Time    12/03/13  5:10 PM      Result Value Range   Glucose-Capillary 135 (*) 70 - 99 mg/dL  GLUCOSE, CAPILLARY     Status: Abnormal   Collection Time    12/03/13  9:17 PM      Result Value Range   Glucose-Capillary 213 (*) 70 - 99 mg/dL  BASIC METABOLIC PANEL     Status: Abnormal   Collection Time    12/04/13  5:10 AM      Result Value Range   Sodium 141  137 - 147 mEq/L   Potassium 4.1  3.7 - 5.3 mEq/L   Chloride 105  96 - 112 mEq/L   Comment: RESULT REPEATED AND VERIFIED     DELTA CHECK NOTED   CO2 23  19 - 32 mEq/L   Glucose, Bld 169 (*) 70 - 99 mg/dL   BUN 11  6 - 23 mg/dL   Creatinine, Ser 1.02  0.50 - 1.10 mg/dL   Calcium 8.3 (*) 8.4 - 10.5 mg/dL   GFR calc non Af Amer 59 (*) >90 mL/min   GFR calc Af Amer 69 (*) >90 mL/min   Comment: (NOTE)     The eGFR has been calculated using the CKD EPI equation.      This calculation has not been validated in all clinical situations.     eGFR's persistently <90 mL/min signify possible Chronic Kidney     Disease.  CBC     Status: Abnormal   Collection Time    12/04/13  5:10 AM  Result Value Range   WBC 23.2 (*) 4.0 - 10.5 K/uL   RBC 3.47 (*) 3.87 - 5.11 MIL/uL   Hemoglobin 9.8 (*) 12.0 - 15.0 g/dL   HCT 31.1 (*) 36.0 - 46.0 %   MCV 89.6  78.0 - 100.0 fL   MCH 28.2  26.0 - 34.0 pg   MCHC 31.5  30.0 - 36.0 g/dL   RDW 15.3  11.5 - 15.5 %   Platelets 249  150 - 400 K/uL  HEPATIC FUNCTION PANEL     Status: Abnormal   Collection Time    12/04/13  5:10 AM      Result Value Range   Total Protein 5.7 (*) 6.0 - 8.3 g/dL   Albumin 2.2 (*) 3.5 - 5.2 g/dL   AST 14  0 - 37 U/L   ALT 13  0 - 35 U/L   Alkaline Phosphatase 131 (*) 39 - 117 U/L   Total Bilirubin 0.2 (*) 0.3 - 1.2 mg/dL   Bilirubin, Direct <0.2  0.0 - 0.3 mg/dL   Indirect Bilirubin NOT CALCULATED  0.3 - 0.9 mg/dL  GLUCOSE, CAPILLARY     Status: Abnormal   Collection Time    12/04/13  7:33 AM      Result Value Range   Glucose-Capillary 145 (*) 70 - 99 mg/dL  PROTIME-INR     Status: None   Collection Time    12/04/13 11:09 AM      Result Value Range   Prothrombin Time 13.2  11.6 - 15.2 seconds   INR 1.02  0.00 - 1.49  APTT     Status: Abnormal   Collection Time    12/04/13 11:09 AM      Result Value Range   aPTT 41 (*) 24 - 37 seconds   Comment:            IF BASELINE aPTT IS ELEVATED,     SUGGEST PATIENT RISK ASSESSMENT     BE USED TO DETERMINE APPROPRIATE     ANTICOAGULANT THERAPY.  GLUCOSE, CAPILLARY     Status: Abnormal   Collection Time    12/04/13 11:42 AM      Result Value Range   Glucose-Capillary 172 (*) 70 - 99 mg/dL  CULTURE, ROUTINE-ABSCESS     Status: None   Collection Time    12/04/13  4:19 PM      Result Value Range   Specimen Description LIVER     Special Requests NONE     Gram Stain       Value: NO WBC SEEN     NO SQUAMOUS EPITHELIAL CELLS SEEN     NO  ORGANISMS SEEN     Performed at [facility]   Culture PENDING     Report Status PENDING    GLUCOSE, CAPILLARY     Status: Abnormal   Collection Time    12/04/13  4:44 PM      Result Value Range   Glucose-Capillary 109 (*) 70 - 99 mg/dL  GLUCOSE, CAPILLARY     Status: Abnormal   Collection Time    12/04/13  9:33 PM      Result Value Range   Glucose-Capillary 241 (*) 70 - 99 mg/dL  COMPREHENSIVE METABOLIC PANEL     Status: Abnormal   Collection Time    12/05/13  5:40 AM      Result Value Range   Sodium 136 (*) 137 - 147 mEq/L   Potassium 3.8  3.7 - 5.3  mEq/L   Chloride 97  96 - 112 mEq/L   Comment: DELTA CHECK NOTED   CO2 25  19 - 32 mEq/L   Glucose, Bld 155 (*) 70 - 99 mg/dL   BUN 10  6 - 23 mg/dL   Creatinine, Ser 1.07  0.50 - 1.10 mg/dL   Calcium 8.6  8.4 - 10.5 mg/dL   Total Protein 6.2  6.0 - 8.3 g/dL   Albumin 2.4 (*) 3.5 - 5.2 g/dL   AST 19  0 - 37 U/L   ALT 15  0 - 35 U/L   Alkaline Phosphatase 123 (*) 39 - 117 U/L   Total Bilirubin 0.3  0.3 - 1.2 mg/dL   GFR calc non Af Amer 56 (*) >90 mL/min   GFR calc Af Amer 65 (*) >90 mL/min   Comment: (NOTE)     The eGFR has been calculated using the CKD EPI equation.     This calculation has not been validated in all clinical situations.     eGFR's persistently <90 mL/min signify possible Chronic Kidney     Disease.  CBC WITH DIFFERENTIAL     Status: Abnormal   Collection Time    12/05/13  5:40 AM      Result Value Range   WBC 13.7 (*) 4.0 - 10.5 K/uL   RBC 3.79 (*) 3.87 - 5.11 MIL/uL   Hemoglobin 10.8 (*) 12.0 - 15.0 g/dL   HCT 33.7 (*) 36.0 - 46.0 %   MCV 88.9  78.0 - 100.0 fL   MCH 28.5  26.0 - 34.0 pg   MCHC 32.0  30.0 - 36.0 g/dL   RDW 14.9  11.5 - 15.5 %   Platelets 275  150 - 400 K/uL   Neutrophils Relative % 88 (*) 43 - 77 %   Neutro Abs 12.0 (*) 1.7 - 7.7 K/uL   Lymphocytes Relative 6 (*) 12 - 46 %   Lymphs Abs 0.9  0.7 - 4.0 K/uL   Monocytes Relative 5  3 - 12 %   Monocytes Absolute 0.7  0.1 -  1.0 K/uL   Eosinophils Relative 1  0 - 5 %   Eosinophils Absolute 0.1  0.0 - 0.7 K/uL   Basophils Relative 0  0 - 1 %   Basophils Absolute 0.0  0.0 - 0.1 K/uL  GLUCOSE, CAPILLARY     Status: Abnormal   Collection Time    12/05/13  7:51 AM      Result Value Range   Glucose-Capillary 167 (*) 70 - 99 mg/dL  GLUCOSE, CAPILLARY     Status: Abnormal   Collection Time    12/05/13 11:51 AM      Result Value Range   Glucose-Capillary 212 (*) 70 - 99 mg/dL   Comment 1 Notify RN        Component Value Date/Time   SDES LIVER 12/04/2013 1619   SPECREQUEST NONE 12/04/2013 1619   CULT PENDING 12/04/2013 1619   REPTSTATUS PENDING 12/04/2013 1619   Ct Abdomen Pelvis W Contrast  12/03/2013   CLINICAL DATA:  History of Whipple procedure for bile duct carcinoma. New sepsis. New liver lesion  EXAM: CT ABDOMEN AND PELVIS WITH CONTRAST  TECHNIQUE: Multidetector CT imaging of the abdomen and pelvis was performed using the standard protocol following bolus administration of intravenous contrast.  CONTRAST:  169m OMNIPAQUE IOHEXOL 300 MG/ML  SOLN  COMPARISON:  MR ABDOMEN WO/W CM dated 11/25/2013  FINDINGS: Lung bases are clear.  No pericardial  fluid.  Again demonstrated a large subcapsular lesion within the posterior right hepatic lobe measuring 4.5 x 4.5 cm similar to 4.8 x 4.3 cm on comparison MRI. This lesion continues demonstrate mild peripheral enhancement and central heterogeneity. No additional hepatic lesions. There is pneumobilia in the left hepatic lobe consistent with choledochojejunostomy. There is some edema involving the bowel along the porta hepatis and small amount of fluid small of fluid along Morrison's pouch is similar prior.  Small nodule inferior right hepatic lobe measuring 8 mm unchanged (image 31, series 2 ). The body and tail of the pancreas normal. The spleen, adrenal glands, kidneys are normal. There is some cortical thinning on the right.  Small bowel and colon are unremarkable. There is no  evidence of abscess in the pelvis.  Post hysterectomy anatomy. The bladder is normal. No pelvic lymphadenopathy. No aggressive osseous lesion.  IMPRESSION: 1. No change in large enhancing lesion the posterior aspect of the right hepatic lobe. In patient with sepsis of unknown origin would consider hepatic abscess as a distinct possibility although adenocarcinoma recurrence cannot be excluded. Recommend ultrasound guided percutaneous biopsy of this lesion to differentiate between abscess and malignancy. Favor abscess. 2. Edema along the proximal bowel and Morrison's pouch likely related to the hepatic process. 3. Patient status post a Whipple procedure without complicating features. This was made a call report.   Electronically Signed   By: Suzy Bouchard M.D.   On: 12/03/2013 20:18   US Biopsy  12/04/2013   CLINICAL DATA:  58 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 Patient has an elevated white count and chills. Evaluate for recurrent cancer versus abscess.  EXAM: ULTRASOUND GUIDED BIOPSY OF THE LIVER LESION  MEDICATIONS: 2.0 mg IV Versed; 100 mcg IV Fentanyl. A radiology nurse monitored the patient throughout the procedure.  Total Moderate Sedation Time: 25 min  PROCEDURE: The procedure, risks, benefits, and alternatives were explained to the patient. Questions regarding the procedure were encouraged and answered. The patient understands and consents to the procedure.  The right upper abdomen was prepped with chlorhexidine. A sterile drape was placed. The skin and liver capsule were anesthetized with 1% lidocaine. A 17 gauge needle was directed into the posterior right hepatic lesion and tried to aspirate fluid from the 17 gauge needle. No purulent fluid was obtained. A small amount of bloody fluid was collected. Three core biopsies were obtained with an 18 gauge device. Specimens were placed in formalin. Needle removed without complication.  COMPLICATIONS: None.  FINDINGS:  Heterogeneous hypoechoic lesion in the posterior right hepatic lobe. Needle placement was confirmed within the lesion. Following the first core biopsy, there was development of multiple echogenic foci throughout the lesion. Echogenic foci probably represent gas from the biliary system. No significant bleeding.  IMPRESSION: Ultrasound-guided core biopsies of the liver lesion. Solid core material was obtained from the biopsies and no purulent fluid could be obtained. Findings are suspicious for recurrent cancer rather than abscess. A sample of the aspirated blood was sent for Gram stain and culture.   Electronically Signed   By: Markus Daft M.D.   On: 12/04/2013 16:36     Recent Results (from the past 720 hour(s))  URINE CULTURE     Status: None   Collection Time    11/16/13  3:11 PM      Result Value Range Status   Colony Count NO GROWTH   Final   Organism ID, Bacteria NO GROWTH   Final  CULTURE,  BLOOD (ROUTINE X 2)     Status: None   Collection Time    11/25/13  4:25 PM      Result Value Range Status   Specimen Description BLOOD RIGHT ARM   Final   Special Requests BOTTLES DRAWN AEROBIC AND ANAEROBIC 5CC   Final   Culture  Setup Time     Final   Value: 11/25/2013 21:04     Performed at [facility]   Culture     Final   Value: NO GROWTH 5 DAYS     Performed at [facility]   Report Status 12/01/2013 FINAL   Final  CULTURE, BLOOD (ROUTINE X 2)     Status: None   Collection Time    11/25/13  4:30 PM      Result Value Range Status   Specimen Description BLOOD LEFT FOREARM   Final   Special Requests BOTTLES DRAWN AEROBIC AND ANAEROBIC 5CC   Final   Culture  Setup Time     Final   Value: 11/25/2013 21:02     Performed at [facility]   Culture     Final   Value: NO GROWTH 5 DAYS     Performed at [facility]   Report Status 12/01/2013 FINAL   Final  URINE CULTURE     Status: None   Collection Time    11/25/13  5:38 PM      Result Value Range Status    Specimen Description URINE, CLEAN CATCH   Final   Special Requests NONE   Final   Culture  Setup Time     Final   Value: 11/26/2013 00:22     Performed at [facility]     Final   Value: 20,OOO COLONIES/ML     Performed at [facility]   Culture     Final   Value: Multiple bacterial morphotypes present, none predominant. Suggest appropriate recollection if clinically indicated.     Performed at [facility]   Report Status 11/28/2013 FINAL   Final  MRSA PCR SCREENING     Status: None   Collection Time    11/26/13  3:17 AM      Result Value Range Status   MRSA by PCR NEGATIVE  NEGATIVE Final   Comment:            The GeneXpert MRSA Assay (FDA     approved for NASAL specimens     only), is one component of a     comprehensive MRSA colonization     surveillance program. It is not     intended to diagnose MRSA     infection nor to guide or     monitor treatment for     MRSA infections.  CULTURE, BLOOD (ROUTINE X 2)     Status: None   Collection Time    12/03/13  1:15 PM      Result Value Range Status   Specimen Description BLOOD RIGHT FOREARM  5 ML IN [facility] BOTTLE   Final   Special Requests NONE   Final   Culture  Setup Time     Final   Value: 12/03/2013 19:43     Performed at [facility]   Culture     Final   Value:        BLOOD CULTURE RECEIVED NO GROWTH TO DATE CULTURE WILL BE HELD FOR 5 DAYS BEFORE ISSUING A FINAL  NEGATIVE REPORT     Performed at [facility]   Report Status PENDING   Incomplete  CULTURE, BLOOD (ROUTINE X 2)     Status: None   Collection Time    12/03/13  2:09 PM      Result Value Range Status   Specimen Description BLOOD RIGHT FOREARM  5 ML IN [facility] BOTTLE   Final   Special Requests NONE   Final   Culture  Setup Time     Final   Value: 12/03/2013 19:43     Performed at [facility]   Culture     Final   Value:        BLOOD CULTURE RECEIVED NO GROWTH TO DATE CULTURE WILL BE HELD FOR 5 DAYS  BEFORE ISSUING A FINAL NEGATIVE REPORT     Performed at [facility]   Report Status PENDING   Incomplete  CULTURE, ROUTINE-ABSCESS     Status: None   Collection Time    12/04/13  4:19 PM      Result Value Range Status   Specimen Description LIVER   Final   Special Requests NONE   Final   Gram Stain     Final   Value: NO WBC SEEN     NO SQUAMOUS EPITHELIAL CELLS SEEN     NO ORGANISMS SEEN     Performed at [facility]   Culture PENDING   Incomplete   Report Status PENDING   Incomplete     Impression/Recommendation  58 year old lady with history of bile duct cancer status post resection of her bile ducts status post chemotherapy and radiation therapy finished in 2010 with multiple admissions for fevers and apparent Sirs without clear-cut cause. She has had a lesion in her liver that is just now been biopsied and there was concern raised that this might be a liver abscess. Based on the description of the biopsy procedure yesterday abscess seems less likely and unfortunately recurrence of her cancer seems to be now at the top of the list of likely causes of her liver lesion.  #1 fever of unknown origin.: Certainly she has recurrence of malignancy in the liver this itself could be causing her fevers. Certainly also if she has malignancy  There could be superinfection in the liver and /or what remains of biliary system.  I agree with covering her broadly with vancomycin and cefepime and I'm adding oral Flagyl to this regimen.  Will need followup the biopsy results closely  #2 liver lesion again as above likely recurrence of her cancer.  #3 screening will screen her for HIV and hepatitis viruses  Dr Linus Salmons is here tomorrow.   Thank you so much for this interesting consult  [place] for [place] [phone] (pager) [phone] (office) 12/05/2013, 3:39 PM  Rhina Br[place] 12/05/2013, 3:39 PM

## 2013-12-05 NOTE — Progress Notes (Signed)
Brief pharmacy update:  27 yoF with bile duct cancer with hepatic lesions suspicious for malignancy now on D3 vancomycin, cefepime for sepsis.  Patient continues to have fevers of unknown origin.  ID consulted and has added flagyl.    Vancomycin trough tonight = 12.5, slightly subtherapeuetic (goal 15-20).    Plan: Increase to vancomycin 1g IV q 12 hours.  Ralene Bathe, PharmD, BCPS 12/05/2013, 8:57 PM  Pager: [phone]

## 2013-12-06 ENCOUNTER — Inpatient Hospital Stay (HOSPITAL_COMMUNITY): Payer: BC Managed Care – PPO

## 2013-12-06 LAB — HEPATITIS PANEL, ACUTE
HCV AB: NEGATIVE
Hep A IgM: NONREACTIVE
Hep B C IgM: NONREACTIVE
Hepatitis B Surface Ag: NEGATIVE

## 2013-12-06 LAB — BASIC METABOLIC PANEL
BUN: 12 mg/dL (ref 6–23)
CALCIUM: 8.4 mg/dL (ref 8.4–10.5)
CO2: 28 meq/L (ref 19–32)
CREATININE: 0.9 mg/dL (ref 0.50–1.10)
Chloride: 99 mEq/L (ref 96–112)
GFR calc Af Amer: 80 mL/min — ABNORMAL LOW (ref >90)
GFR, EST NON AFRICAN AMERICAN: 69 mL/min — AB (ref >90)
Glucose, Bld: 183 mg/dL — ABNORMAL HIGH (ref 70–99)
Potassium: 3.6 mEq/L — ABNORMAL LOW (ref 3.7–5.3)
SODIUM: 139 meq/L (ref 137–147)

## 2013-12-06 LAB — GLUCOSE, CAPILLARY
GLUCOSE-CAPILLARY: 254 mg/dL — AB (ref 70–99)
Glucose-Capillary: 155 mg/dL — ABNORMAL HIGH (ref 70–99)
Glucose-Capillary: 275 mg/dL — ABNORMAL HIGH (ref 70–99)

## 2013-12-06 LAB — CBC
HCT: 32.6 % — ABNORMAL LOW (ref 36.0–46.0)
Hemoglobin: 10.4 g/dL — ABNORMAL LOW (ref 12.0–15.0)
MCH: 28.3 pg (ref 26.0–34.0)
MCHC: 31.9 g/dL (ref 30.0–36.0)
MCV: 88.8 fL (ref 78.0–100.0)
PLATELETS: 261 10*3/uL (ref 150–400)
RBC: 3.67 MIL/uL — AB (ref 3.87–5.11)
RDW: 14.7 % (ref 11.5–15.5)
WBC: 6.5 10*3/uL (ref 4.0–10.5)

## 2013-12-06 MED ORDER — GADOBENATE DIMEGLUMINE 529 MG/ML IV SOLN
14.0000 mL | Freq: Once | INTRAVENOUS | Status: AC | PRN
  Administered 2013-12-06: 14 mL via INTRAVENOUS

## 2013-12-06 MED ORDER — BENZONATATE 100 MG PO CAPS
100.0000 mg | ORAL_CAPSULE | Freq: Three times a day (TID) | ORAL | Status: DC | PRN
  Administered 2013-12-06 – 2013-12-13 (×14): 100 mg via ORAL
  Filled 2013-12-06 (×15): qty 1

## 2013-12-06 NOTE — Progress Notes (Signed)
Courtney Stevens   DOB:18-Apr-1956   ZO#:[id number]   JWJ#:[id number]  Subjective:  Sitting up in chair at bedside.  Echo negative.  Awaits remaining cultures. She denies pain.  She tolerates PO without difficulty. Husband at bedside.   Objective:  Filed Vitals:   12/06/13 1319  BP: 141/85  Pulse: 95  Temp: 97.8 F (36.6 C)  Resp: 20    Body mass index is 24.43 kg/(m^2).  Intake/Output Summary (Last 24 hours) at 12/06/13 1726 Last data filed at 12/06/13 1500  Gross per 24 hour  Intake 3351.67 ml  Output   3375 ml  Net -23.33 ml    Laying in bed; chronically ill appearing. Slightly overweight  Sclerae unicteric  Oropharynx clear  No peripheral adenopathy  Lungs clear -- no rales or rhonchi  Heart regular rate and rhythm  Abdomen mild TTP in lower quandrant; + BS no organomegaly appreciated  MSK no peripheral edema  Neuro nonfocal   CBG (last 3)   Recent Labs  12/06/13 0733 12/06/13 1137 12/06/13 1700  GLUCAP 155* 275* 254*     Labs:  Lab Results  Component Value Date   WBC 6.5 12/06/2013   HGB 10.4* 12/06/2013   HCT 32.6* 12/06/2013   MCV 88.8 12/06/2013   PLT 261 12/06/2013   NEUTROABS 12.0* 05/08/3085   Basic Metabolic Panel:  Recent Labs Lab 11/30/13 0545 12/03/13 1315 12/04/13 0510 12/05/13 0540 12/06/13 0518  NA 137 137 141 136* 139  K 3.8 3.5* 4.1 3.8 3.6*  CL 101 96 105 97 99  CO2 25 25 23 25 28   GLUCOSE 181* 176* 169* 155* 183*  BUN 7 12 11 10 12   CREATININE 1.06 0.91 1.02 1.07 0.90  CALCIUM 8.4 8.8 8.3* 8.6 8.4   GFR Estimated Creatinine Clearance: 66.3 ml/min (by C-G formula based on Cr of 0.9). Liver Function Tests:  Recent Labs Lab 12/04/13 0510 12/05/13 0540  AST 14 19  ALT 13 15  ALKPHOS 131* 123*  BILITOT 0.2* 0.3  PROT 5.7* 6.2  ALBUMIN 2.2* 2.4*   CBC:  Recent Labs Lab 11/30/13 0545 12/03/13 1315 12/04/13 0510 12/05/13 0540 12/06/13 0518  WBC 8.6 16.5* 23.2* 13.7* 6.5  NEUTROABS  --  15.7*  --  12.0*  --   HGB 10.7*  10.2* 9.8* 10.8* 10.4*  HCT 32.7* 31.5* 31.1* 33.7* 32.6*  MCV 87.7 89.0 89.6 88.9 88.8  PLT 226 261 249 275 261   CBG:  Recent Labs Lab 12/05/13 1717 12/05/13 2254 12/06/13 0733 12/06/13 1137 12/06/13 1700  GLUCAP 254* 206* 155* 275* 254*   Microbiology Recent Results (from the past 240 hour(s))  CULTURE, BLOOD (ROUTINE X 2)     Status: None   Collection Time    12/03/13  1:15 PM      Result Value Range Status   Specimen Description BLOOD RIGHT FOREARM  5 ML IN [facility] BOTTLE   Final   Special Requests NONE   Final   Culture  Setup Time     Final   Value: 12/03/2013 19:43     Performed at [facility]   Culture     Final   Value:        BLOOD CULTURE RECEIVED NO GROWTH TO DATE CULTURE WILL BE HELD FOR 5 DAYS BEFORE ISSUING A FINAL NEGATIVE REPORT     Performed at [facility]   Report Status PENDING   Incomplete  CULTURE, BLOOD (ROUTINE X 2)     Status: None  Collection Time    12/03/13  2:09 PM      Result Value Range Status   Specimen Description BLOOD RIGHT FOREARM  5 ML IN [facility] BOTTLE   Final   Special Requests NONE   Final   Culture  Setup Time     Final   Value: 12/03/2013 19:43     Performed at [facility]   Culture     Final   Value:        BLOOD CULTURE RECEIVED NO GROWTH TO DATE CULTURE WILL BE HELD FOR 5 DAYS BEFORE ISSUING A FINAL NEGATIVE REPORT     Performed at [facility]   Report Status PENDING   Incomplete  CULTURE, ROUTINE-ABSCESS     Status: None   Collection Time    12/04/13  4:19 PM      Result Value Range Status   Specimen Description LIVER   Final   Special Requests NONE   Final   Gram Stain     Final   Value: NO WBC SEEN     NO SQUAMOUS EPITHELIAL CELLS SEEN     NO ORGANISMS SEEN     Performed at [facility]   Culture     Final   Value: NO GROWTH 1 DAY     Performed at [facility]   Report Status PENDING   Incomplete    Results for Courtney, Stevens (MRN [id number]) as of 11/26/2013  11:58  Ref. Range 03/04/2010 05:00  CA 125 Latest Range: 0.0-30.2 U/mL 368.9 (H)   Results for Courtney, Stevens (MRN [id number]) as of 11/26/2013 11:58  Ref. Range 08/14/2012 07:56 01/11/2013 10:23 02/25/2013 09:38 08/31/2013 10:32 11/25/2013 16:25  CA 19-9 Latest Range: <35.0 U/mL 30.4 83.7 (H) 28.3 28.7   CEA Latest Range: 0.0-5.0 ng/mL 12.8 (H) 10.7 (H) 14.3 (H) 12.0 (H) 14.4 (H)    Studies:   RADIOGRAPHIC STUDIES: Personally reviewed by me.  IMPRESSION:  CT CHEST (Apr 14, 2013)  1. The previously noted 1-3 mm pulmonary nodules have either resolved, decreased in size or are stable compared to the prior examination. Given the lack of a clearly definable craniocaudal gradient, and the behavior these nodules, these favored to reflect benign disease (particularly if there has been no interval chemotherapy or other intervention), but are nonspecific. Continued attention on future follow up studies is suggested.  2. Atherosclerosis, including three-vessel coronary artery disease. Please note that although the presence of coronary artery calcium documents the presence of coronary artery disease, the severity of this disease and any potential stenosis cannot be assessed on this non-gated CT examination. Assessment for  potential risk factor modification, dietary therapy or pharmacologic therapy may be warranted, if clinically indicated.  3. Additional incidental findings, as above.  Mr Courtney Stevens CZ Contrast  12/06/2013   CLINICAL DATA:  Bile duct cancer post resection 2009. Fever. Headaches. Diabetic hypertensive hyperlipidemia patient.  EXAM: MRI HEAD WITHOUT AND WITH CONTRAST  TECHNIQUE: Multiplanar, multiecho pulse sequences of the brain and surrounding structures were obtained without and with intravenous contrast.  CONTRAST:  14 cc MultiHance.  COMPARISON:  11/19/2013 CT.  No comparison brain MR.  FINDINGS: No acute infarct.  No intracranial hemorrhage.  Minimal nonspecific white matter type changes without  MR evidence of intracranial infection. If this is of high clinical concern, correlation with cerebral spinal fluid analysis may then be considered.  Prominent peri vascular spaces versus remote basal ganglia lacune infarcts.  No intracranial mass or  abnormal enhancement.  Major intracranial vascular structures are patent.  Cervical medullary junction, pituitary region, pineal region and orbital structures unremarkable.  Minimal mucosal thickening inferior aspect right maxillary sinus.  IMPRESSION: No acute infarct.  Minimal nonspecific white matter type changes without MR evidence of intracranial infection. If this is of high clinical concern, correlation with cerebral spinal fluid analysis may then be considered.  Prominent peri vascular spaces versus remote basal ganglia lacune infarcts.  No intracranial mass or abnormal enhancement.   Electronically Signed   By: Chauncey Cruel M.D.   On: 12/06/2013 17:08   Mr Lumbar Spine W Wo Contrast  12/06/2013   CLINICAL DATA:  Bile duct cancer. Fever. Back pain. Query abscess/ infection.  EXAM: MRI LUMBAR SPINE WITHOUT AND WITH CONTRAST  TECHNIQUE: Multiplanar and multiecho pulse sequences of the lumbar spine were obtained without and with intravenous contrast.  CONTRAST:  13mL MULTIHANCE GADOBENATE DIMEGLUMINE 529 MG/ML IV SOLN  COMPARISON:  MR ABDOMEN WO/W CM dated 11/25/2013; CT ABD/PELVIS W CM dated 12/03/2013  FINDINGS: The lowest lumbar type non-rib-bearing vertebra is labeled as L5. The conus medullaris appears normal. Conus level: L1-2.  No vertebral subluxation is observed. Intervertebral disc desiccation is observed at L4-5. No significant abnormal lumbar vertebral edema or vertebral enhancement identified.  Right kidney upper pole atrophy/scarring noted. No findings of discitis -osteomyelitis. No significant abnormal epidural process. No clumping of nerve roots to suggest arachnoiditis.  Despite efforts by the technologist and patient, motion artifact is present on  today's exam and could not be eliminated. This reduces exam sensitivity and specificity. Additional findings at individual levels are as follows:  L1-2:  Unremarkable.  L2-3:  No impingement.  Left facet arthropathy.  L3-4:  Unremarkable.  L4-5: No impingement. Minimal disc bulge with right foraminal annular tear.  L5-S1:  No impingement.  Right greater than left facet arthropathy.  IMPRESSION: 1. No lumbar impingement or findings suspicious for infection or malignancy involving the lumbar spine. 2. Mild lumbar spondylosis and degenerative disc disease.   Electronically Signed   By: Sherryl Barters M.D.   On: 12/06/2013 16:55    Assessment: 58 y.o.  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 We are consulted for recommendations on further oncology workup.   1. SIRS, unclear etiology.  --Management per primary team.  --Hydration, antibiotics (vancomycin/zosyn) and insulin.  Blood cultures NGTD. Prior Echo negative. Urine culture negative.   CXR negative.   --ID on board.  F/u MRI of brain per ID.    2. 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 Birdie Sons at [facility] center on 02/22/2008.  She underwent a  radical resection for node-positive adenocarcinoma of the bile duct with positive lymph node involvement (2 out of 17 lymph nodes) with positive surgical margins. She is status post radiation with continuous infusion 5-FU from 04/01/2008 through 05/24/2008. She then received 3 cycles of gemcitabine from 05/19/2008 to 12/01/2008. She was re-treated with gemcitabine from 02/17/2009 through 05/25/2009 due to increasing tumor markers. She was switched to single-agent Taxotere and received 3 cycles  from 06/15/2009 through 07/27/2009. She reports her last colonoscopy was less than one year ago with one tubulus villous polyp removed and she has to follow up for repeat colonoscopy in 3 years.   --We  will follow up pathology from recent liver biopsy.  Based on operative note, no pus making recurrence more likely.  Her MRI of abdomen indicated a new peripherally enhancing mass posteromedially within the right hepatic lobe, highly worrisome for local recurrence or metastatic disease from the patient's bile duct adenocarcinoma.      -She should recover from #1.   No role for chemotherapy doing her acute illness.   Tumor markers are as noted above.  If determined to not be a surgical candidate, chemotherapy options include gemcitabine/cisplatin combination therapy or clinical trial or best supportive care.    3. DM2.  --As noted above.   4. Normocytic anemia likely Anemia of chronic disease. --She is asymptomatic for anemia presently.  Transfuse prn symptoms.  5. Disposition.  --Full Code.   We will follow this patient.    Farheen Pfahler, MD 12/06/2013  5:26 PM

## 2013-12-06 NOTE — Progress Notes (Signed)
Inpatient Diabetes Program Recommendations  AACE/ADA: New Consensus Statement on Inpatient Glycemic Control (2013)  Target Ranges:  Prepandial:   less than 140 mg/dL      Peak postprandial:   less than 180 mg/dL (1-2 hours)      Critically ill patients:  140 - 180 mg/dL   Reason for Assessment: Sub-optimal glycemic control  Results for Courtney Stevens, Courtney Stevens (MRN [id number]) as of 12/06/2013 14:06  Ref. Range 12/05/2013 07:51 12/05/2013 11:51 12/05/2013 17:17 12/05/2013 22:54 12/06/2013 07:33 12/06/2013 11:37  Glucose-Capillary Latest Range: 70-99 mg/dL 167 (H) 212 (H) 254 (H) 206 (H) 155 (H) 275 (H)   Note:  Glucose pattern indicates that Novolog meal coverage would be helpful in addition to correction scale.  Request MD consider adding at least 4 units Novolog tid with meals in addition to correction scale and to be held if CBG less than 70 mg/dl or patient eats < 50%.  Thank you.  Keison Glendinning S. Marcelline Mates, RN, CNS, CDE Inpatient Diabetes Program, team pager [phone]

## 2013-12-06 NOTE — Progress Notes (Signed)
PROGRESS NOTE  Courtney Stevens MCN:[id number] DOB: Mar 30, 1956 DOA: 12/03/2013 PCP: [name], MD  Assessment/Plan:  Sepsis - Patient met criteria for sepsis on admission given leukocytosis, fever, RR>20, tachycardia and lactic acidosis; sepsis physiology improved this morning. Blood cultures pending, NGTD. Continue Vancomycin and Cefepime for now.  - IR consulted for sampling liver mass/abscess s/p biopsy on 1/17, without frank puss and based on appearance likely cancer. ID consulted, appreciate input since there is no clear source. Mild cough, will send sputum.  - concern for back pain, MRI ordered per ID. Malignant neoplasm of extrahepatic bile ducts - follows with Dr. Charlton Amor. Will add in Epic. New mass biopsied, awaiting pathology.  DM Hold home medications. We'll place on sliding scale insulin  Pancreatic insufficiency Continue pancreatic enzymes  Unspecified essential hypertension Hold home blood pressure medications.  Hypothyroidism Continue Synthroid  Diet: diabetic Fluids: NS DVT Prophylaxis: Lovenox  Code Status: Full Family Communication: none  Disposition Plan: inpatient  Consultants:  ID  IR  Procedures:  none   Antibiotics Vancomycin 1/16 >> Cefepime 1/16 >>  HPI/Subjective: - mild pain at the biopsy site.  Objective: Filed Vitals:   12/05/13 2252 12/06/13 0535 12/06/13 0800 12/06/13 1319  BP: 125/75 132/72 137/72 141/85  Pulse: 62 62 68 95  Temp: 98 F (36.7 C) 97.6 F (36.4 C) 98.2 F (36.8 C) 97.8 F (36.6 C)  TempSrc: Oral Oral Oral Oral  Resp: _0 Height:      Weight:      SpO2: 98% 99% 98% 97%    Intake/Output Summary (Last 24 hours) at 12/06/13 1349 Last data filed at 12/06/13 1319  Gross per 24 hour  Intake 2451.67 ml  Output   3975 ml  Net -1523.33 ml   Filed Weights   12/03/13 1531  Weight: 70.761 kg (156 lb)    Exam:  General:  NAD  Cardiovascular: regular rate and rhythm, soft 2/6 SEM  Respiratory:  good air movement, clear to auscultation throughout, no wheezing, ronchi or rales  Abdomen: soft, RUQ and epigatric tenderness to palpation, positive bowel sounds  MSK: no peripheral edema  Neuro: non focal  Data Reviewed: Basic Metabolic Panel:  Recent Labs Lab 11/30/13 0545 12/03/13 1315 12/04/13 0510 12/05/13 0540 12/06/13 0518  NA 137 137 141 136* 139  K 3.8 3.5* 4.1 3.8 3.6*  CL 101 96 105 97 99  CO2 _1 GLUCOSE 181* 176* 169* 155* 183*  BUN _2 CREATININE 1.06 0.91 1.02 1.07 0.90  CALCIUM 8.4 8.8 8.3* 8.6 8.4   Liver Function Tests:  Recent Labs Lab 12/04/13 0510 12/05/13 0540  AST 14 19  ALT 13 15  ALKPHOS 131* 123*  BILITOT 0.2* 0.3  PROT 5.7* 6.2  ALBUMIN 2.2* 2.4*   CBC:  Recent Labs Lab 11/30/13 0545 12/03/13 1315 12/04/13 0510 12/05/13 0540 12/06/13 0518  WBC 8.6 16.5* 23.2* 13.7* 6.5  NEUTROABS  --  15.7*  --  12.0*  --   HGB 10.7* 10.2* 9.8* 10.8* 10.4*  HCT 32.7* 31.5* 31.1* 33.7* 32.6*  MCV 87.7 89.0 89.6 88.9 88.8  PLT 226 261 249 275 261   CBG:  Recent Labs Lab 12/05/13 1151 12/05/13 1717 12/05/13 2254 12/06/13 0733 12/06/13 1137  GLUCAP 212* 254* 206* 155* 275*    Recent Results (from the past 240 hour(s))  CULTURE, BLOOD (ROUTINE X 2)     Status: None   Collection Time  12/03/13  1:15 PM      Result Value Range Status   Specimen Description BLOOD RIGHT FOREARM  5 ML IN [facility] BOTTLE   Final   Special Requests NONE   Final   Culture  Setup Time     Final   Value: 12/03/2013 19:43     Performed at [facility]   Culture     Final   Value:        BLOOD CULTURE RECEIVED NO GROWTH TO DATE CULTURE WILL BE HELD FOR 5 DAYS BEFORE ISSUING A FINAL NEGATIVE REPORT     Performed at [facility]   Report Status PENDING   Incomplete  CULTURE, BLOOD (ROUTINE X 2)     Status: None   Collection Time    12/03/13  2:09 PM      Result Value Range Status   Specimen Description BLOOD RIGHT  FOREARM  5 ML IN [facility] BOTTLE   Final   Special Requests NONE   Final   Culture  Setup Time     Final   Value: 12/03/2013 19:43     Performed at [facility]   Culture     Final   Value:        BLOOD CULTURE RECEIVED NO GROWTH TO DATE CULTURE WILL BE HELD FOR 5 DAYS BEFORE ISSUING A FINAL NEGATIVE REPORT     Performed at [facility]   Report Status PENDING   Incomplete  CULTURE, ROUTINE-ABSCESS     Status: None   Collection Time    12/04/13  4:19 PM      Result Value Range Status   Specimen Description LIVER   Final   Special Requests NONE   Final   Gram Stain     Final   Value: NO WBC SEEN     NO SQUAMOUS EPITHELIAL CELLS SEEN     NO ORGANISMS SEEN     Performed at [facility]   Culture     Final   Value: NO GROWTH 1 DAY     Performed at [facility]   Report Status PENDING   Incomplete     Studies: US Biopsy  12/04/2013   CLINICAL DATA:  58 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 Patient has an elevated white count and chills. Evaluate for recurrent cancer versus abscess.  EXAM: ULTRASOUND GUIDED BIOPSY OF THE LIVER LESION  MEDICATIONS: 2.0 mg IV Versed; 100 mcg IV Fentanyl. A radiology nurse monitored the patient throughout the procedure.  Total Moderate Sedation Time: 25 min  PROCEDURE: The procedure, risks, benefits, and alternatives were explained to the patient. Questions regarding the procedure were encouraged and answered. The patient understands and consents to the procedure.  The right upper abdomen was prepped with chlorhexidine. A sterile drape was placed. The skin and liver capsule were anesthetized with 1% lidocaine. A 17 gauge needle was directed into the posterior right hepatic lesion and tried to aspirate fluid from the 17 gauge needle. No purulent fluid was obtained. A small amount of bloody fluid was collected. Three core biopsies were obtained with an 18 gauge device. Specimens were placed in formalin. Needle  removed without complication.  COMPLICATIONS: None.  FINDINGS: Heterogeneous hypoechoic lesion in the posterior right hepatic lobe. Needle placement was confirmed within the lesion. Following the first core biopsy, there was development of multiple echogenic foci throughout the lesion. Echogenic foci probably represent gas from the biliary system. No significant  bleeding.  IMPRESSION: Ultrasound-guided core biopsies of the liver lesion. Solid core material was obtained from the biopsies and no purulent fluid could be obtained. Findings are suspicious for recurrent cancer rather than abscess. A sample of the aspirated blood was sent for Gram stain and culture.   Electronically Signed   By: Markus Daft M.D.   On: 12/04/2013 16:36   Scheduled Meds: . calcium-vitamin D  1 tablet Oral TID WC  . ceFEPime (MAXIPIME) IV  2 g Intravenous Q12H  . enoxaparin (LOVENOX) injection  40 mg Subcutaneous Q2200  . furosemide  40 mg Oral BID  . insulin aspart  0-15 Units Subcutaneous TID WC  . iron polysaccharides  150 mg Oral Daily  . levothyroxine  100 mcg Oral Q48H  . levothyroxine  200 mcg Oral Q48H  . lipase/protease/amylase  1 capsule Oral TID AC  . loratadine  10 mg Oral Daily  . magnesium oxide  200 mg Oral TID PC & HS  . metroNIDAZOLE  500 mg Oral Q8H  . multivitamin with minerals  1 tablet Oral Daily  . pantoprazole  40 mg Oral Daily  . potassium chloride SA  20 mEq Oral BID  . sodium chloride  3 mL Intravenous Q12H  . vancomycin  1,000 mg Intravenous Q12H  . Vitamin D (Ergocalciferol)  50,000 Units Oral QPM   Continuous Infusions: . sodium chloride 100 mL/hr at 12/06/13 1043   Principal Problem:   SIRS (systemic inflammatory response syndrome) Active Problems:   Malignant neoplasm of extrahepatic bile ducts   DM   PANCREATIC INSUFFICIENCY   Unspecified essential hypertension   Intrinsic asthma   History of Roux-en-Y gastric bypass   Fever   Hypothyroidism   Lactic acidosis   Sepsis  Time  spent: [place], MD [facility] Pager [phone]. If 7 PM - 7 AM, please contact night-coverage at [email or web address], password [facility] 12/06/2013, 1:49 PM  LOS: 3 days

## 2013-12-06 NOTE — Progress Notes (Signed)
[place] for Infectious Disease  Date of Admission:  12/03/2013  Antibiotics: Antibiotics Given (last 72 hours)   Date/Time Action Medication Dose Rate   12/03/13 1811 Given   ceFEPIme (MAXIPIME) 2 g in dextrose 5 % 50 mL IVPB 2 g 100 mL/hr   12/03/13 1905 Given   vancomycin (VANCOCIN) IVPB 1000 mg/200 mL premix 1,000 mg 200 mL/hr   12/04/13 [id number] Given   ceFEPIme (MAXIPIME) 2 g in dextrose 5 % 50 mL IVPB 2 g 100 mL/hr   12/04/13 [id number] Given   vancomycin (VANCOCIN) IVPB 750 mg/150 ml premix 750 mg 150 mL/hr   12/04/13 1741 Given   ceFEPIme (MAXIPIME) 2 g in dextrose 5 % 50 mL IVPB 2 g 100 mL/hr   12/04/13 2154 Given   vancomycin (VANCOCIN) IVPB 750 mg/150 ml premix 750 mg 150 mL/hr   12/05/13 [id number] Given   ceFEPIme (MAXIPIME) 2 g in dextrose 5 % 50 mL IVPB 2 g 100 mL/hr   12/05/13 0824 Given   vancomycin (VANCOCIN) IVPB 750 mg/150 ml premix 750 mg 150 mL/hr   12/05/13 1606 Given   metroNIDAZOLE (FLAGYL) tablet 500 mg 500 mg    12/05/13 1829 Given   ceFEPIme (MAXIPIME) 2 g in dextrose 5 % 50 mL IVPB 2 g 100 mL/hr   12/05/13 1954 Given   vancomycin (VANCOCIN) IVPB 750 mg/150 ml premix 750 mg 150 mL/hr   12/05/13 2301 Given   metroNIDAZOLE (FLAGYL) tablet 500 mg 500 mg    12/06/13 0532 Given   ceFEPIme (MAXIPIME) 2 g in dextrose 5 % 50 mL IVPB 2 g 100 mL/hr   12/06/13 0532 Given   metroNIDAZOLE (FLAGYL) tablet 500 mg 500 mg    12/06/13 [id number] Given   vancomycin (VANCOCIN) IVPB 1000 mg/200 mL premix 1,000 mg 200 mL/hr   12/06/13 1359 Given   metroNIDAZOLE (FLAGYL) tablet 500 mg 500 mg       Subjective: Some back pain in lower region, no diarrhea, no headache, overall feels fatigued, malaise, some pelvic discomfort  Objective: Temp:  [97.6 F (36.4 C)-98.2 F (36.8 C)] 97.8 F (36.6 C) (01/19 1319) Pulse Rate:  [62-95] 95 (01/19 1319) Resp:  [12-20] 20 (01/19 1319) BP: (100-141)/(60-85) 141/85 mmHg (01/19 1319) SpO2:  [97 %-99 %] 97 % (01/19 1319)  General:  Awake, alert, nad Skin: no rashes Lungs: CTA B Cor: RRR with 2/6 SEM Abdomen: mild tenderness bilateral in pelvis but otherwise soft, normoactive bowel sounds Ext: no edema Back: no spinous tenderness, no spasm  Lab Results Lab Results  Component Value Date   WBC 6.5 12/06/2013   HGB 10.4* 12/06/2013   HCT 32.6* 12/06/2013   MCV 88.8 12/06/2013   PLT 261 12/06/2013    Lab Results  Component Value Date   CREATININE 0.90 12/06/2013   BUN 12 12/06/2013   NA 139 12/06/2013   K 3.6* 12/06/2013   CL 99 12/06/2013   CO2 28 12/06/2013    Lab Results  Component Value Date   ALT 15 12/05/2013   AST 19 12/05/2013   ALKPHOS 123* 12/05/2013   BILITOT 0.3 12/05/2013      Microbiology: Recent Results (from the past 240 hour(s))  CULTURE, BLOOD (ROUTINE X 2)     Status: None   Collection Time    12/03/13  1:15 PM      Result Value Range Status   Specimen Description BLOOD RIGHT FOREARM  5 ML IN [facility] BOTTLE   Final   Special Requests  NONE   Final   Culture  Setup Time     Final   Value: 12/03/2013 19:43     Performed at [facility]   Culture     Final   Value:        BLOOD CULTURE RECEIVED NO GROWTH TO DATE CULTURE WILL BE HELD FOR 5 DAYS BEFORE ISSUING A FINAL NEGATIVE REPORT     Performed at [facility]   Report Status PENDING   Incomplete  CULTURE, BLOOD (ROUTINE X 2)     Status: None   Collection Time    12/03/13  2:09 PM      Result Value Range Status   Specimen Description BLOOD RIGHT FOREARM  5 ML IN [facility] BOTTLE   Final   Special Requests NONE   Final   Culture  Setup Time     Final   Value: 12/03/2013 19:43     Performed at [facility]   Culture     Final   Value:        BLOOD CULTURE RECEIVED NO GROWTH TO DATE CULTURE WILL BE HELD FOR 5 DAYS BEFORE ISSUING A FINAL NEGATIVE REPORT     Performed at [facility]   Report Status PENDING   Incomplete  CULTURE, ROUTINE-ABSCESS     Status: None   Collection Time    12/04/13  4:19 PM      Result  Value Range Status   Specimen Description LIVER   Final   Special Requests NONE   Final   Gram Stain     Final   Value: NO WBC SEEN     NO SQUAMOUS EPITHELIAL CELLS SEEN     NO ORGANISMS SEEN     Performed at [facility]   Culture     Final   Value: NO GROWTH 1 DAY     Performed at [facility]   Report Status PENDING   Incomplete    Studies/Results: US Biopsy  12/04/2013   CLINICAL DATA:  58 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 Patient has an elevated white count and chills. Evaluate for recurrent cancer versus abscess.  EXAM: ULTRASOUND GUIDED BIOPSY OF THE LIVER LESION  MEDICATIONS: 2.0 mg IV Versed; 100 mcg IV Fentanyl. A radiology nurse monitored the patient throughout the procedure.  Total Moderate Sedation Time: 25 min  PROCEDURE: The procedure, risks, benefits, and alternatives were explained to the patient. Questions regarding the procedure were encouraged and answered. The patient understands and consents to the procedure.  The right upper abdomen was prepped with chlorhexidine. A sterile drape was placed. The skin and liver capsule were anesthetized with 1% lidocaine. A 17 gauge needle was directed into the posterior right hepatic lesion and tried to aspirate fluid from the 17 gauge needle. No purulent fluid was obtained. A small amount of bloody fluid was collected. Three core biopsies were obtained with an 18 gauge device. Specimens were placed in formalin. Needle removed without complication.  COMPLICATIONS: None.  FINDINGS: Heterogeneous hypoechoic lesion in the posterior right hepatic lobe. Needle placement was confirmed within the lesion. Following the first core biopsy, there was development of multiple echogenic foci throughout the lesion. Echogenic foci probably represent gas from the biliary system. No significant bleeding.  IMPRESSION: Ultrasound-guided core biopsies of the liver lesion. Solid core material was obtained from the  biopsies and no purulent fluid could be obtained. Findings are suspicious for recurrent cancer rather than abscess.  A sample of the aspirated blood was sent for Gram stain and culture.   Electronically Signed   By: Markus Daft M.D.   On: 12/04/2013 16:36    Assessment/Plan: 1) FUO - WBC and fever seem to correlate and respond to IV antibiotics, did not respond to levaquin.  Source elusive.  No obvious localizing signs.  Has had extensive work up with CT of head, C/A/P.  Liver biopsy seems more likely tumor, no WBCs.  Labs do not suggest any issue with normal biliary and liver tests including normal INR;TTE ok.  Some cough but mild and no opacity. She did have altered mental status with SIRS on presentation.    -with no good localizing signs, no signs of C diff,  Does not seem c/w endocarditis. Only sources at this point are CNS, back. -I will check an MRI of head, spine for ? Abscess, infection, encephalitis with no other good source.  Discussed with patient.    [name], [place] for Infectious Disease [place] [email or web address] [phone] pager   [phone] cell 12/06/2013, 2:56 PM

## 2013-12-07 DIAGNOSIS — E119 Type 2 diabetes mellitus without complications: Secondary | ICD-10-CM

## 2013-12-07 DIAGNOSIS — R05 Cough: Secondary | ICD-10-CM

## 2013-12-07 DIAGNOSIS — R059 Cough, unspecified: Secondary | ICD-10-CM

## 2013-12-07 DIAGNOSIS — D649 Anemia, unspecified: Secondary | ICD-10-CM

## 2013-12-07 LAB — BASIC METABOLIC PANEL
BUN: 10 mg/dL (ref 6–23)
CO2: 28 mEq/L (ref 19–32)
Calcium: 9 mg/dL (ref 8.4–10.5)
Chloride: 101 mEq/L (ref 96–112)
Creatinine, Ser: 0.9 mg/dL (ref 0.50–1.10)
GFR calc Af Amer: 80 mL/min — ABNORMAL LOW (ref >90)
GFR calc non Af Amer: 69 mL/min — ABNORMAL LOW (ref >90)
GLUCOSE: 166 mg/dL — AB (ref 70–99)
POTASSIUM: 4.4 meq/L (ref 3.7–5.3)
SODIUM: 140 meq/L (ref 137–147)

## 2013-12-07 LAB — HEMOGLOBIN A1C
Hgb A1c MFr Bld: 7.9 % — ABNORMAL HIGH (ref <5.7)
MEAN PLASMA GLUCOSE: 180 mg/dL — AB (ref <117)

## 2013-12-07 LAB — GLUCOSE, CAPILLARY
GLUCOSE-CAPILLARY: 148 mg/dL — AB (ref 70–99)
GLUCOSE-CAPILLARY: 270 mg/dL — AB (ref 70–99)
Glucose-Capillary: 244 mg/dL — ABNORMAL HIGH (ref 70–99)

## 2013-12-07 LAB — CBC
HCT: 34.7 % — ABNORMAL LOW (ref 36.0–46.0)
HEMOGLOBIN: 11.2 g/dL — AB (ref 12.0–15.0)
MCH: 28.4 pg (ref 26.0–34.0)
MCHC: 32.3 g/dL (ref 30.0–36.0)
MCV: 87.8 fL (ref 78.0–100.0)
Platelets: 301 10*3/uL (ref 150–400)
RBC: 3.95 MIL/uL (ref 3.87–5.11)
RDW: 14.8 % (ref 11.5–15.5)
WBC: 5.9 10*3/uL (ref 4.0–10.5)

## 2013-12-07 LAB — HIV-1 RNA QUANT-NO REFLEX-BLD: HIV-1 RNA Quant, Log: <1.3 {Log} (ref <1.30)

## 2013-12-07 NOTE — Progress Notes (Signed)
[facility] [facility] for Infectious Disease  Date of Admission:  12/03/2013  Antibiotics: Antibiotics Given (last 72 hours)   Date/Time Action Medication Dose Rate   12/04/13 1741 Given   ceFEPIme (MAXIPIME) 2 g in dextrose 5 % 50 mL IVPB 2 g 100 mL/hr   12/04/13 2154 Given   vancomycin (VANCOCIN) IVPB 750 mg/150 ml premix 750 mg 150 mL/hr   12/05/13 7035 Given   ceFEPIme (MAXIPIME) 2 g in dextrose 5 % 50 mL IVPB 2 g 100 mL/hr   12/05/13 0824 Given   vancomycin (VANCOCIN) IVPB 750 mg/150 ml premix 750 mg 150 mL/hr   12/05/13 1606 Given   metroNIDAZOLE (FLAGYL) tablet 500 mg 500 mg    12/05/13 1829 Given   ceFEPIme (MAXIPIME) 2 g in dextrose 5 % 50 mL IVPB 2 g 100 mL/hr   12/05/13 1954 Given   vancomycin (VANCOCIN) IVPB 750 mg/150 ml premix 750 mg 150 mL/hr   12/05/13 2301 Given   metroNIDAZOLE (FLAGYL) tablet 500 mg 500 mg    12/06/13 0532 Given   ceFEPIme (MAXIPIME) 2 g in dextrose 5 % 50 mL IVPB 2 g 100 mL/hr   12/06/13 0532 Given   metroNIDAZOLE (FLAGYL) tablet 500 mg 500 mg    12/06/13 0824 Given   vancomycin (VANCOCIN) IVPB 1000 mg/200 mL premix 1,000 mg 200 mL/hr   12/06/13 1359 Given   metroNIDAZOLE (FLAGYL) tablet 500 mg 500 mg    12/06/13 1716 Given   ceFEPIme (MAXIPIME) 2 g in dextrose 5 % 50 mL IVPB 2 g 100 mL/hr   12/06/13 2002 Given   vancomycin (VANCOCIN) IVPB 1000 mg/200 mL premix 1,000 mg 200 mL/hr   12/06/13 2153 Given   metroNIDAZOLE (FLAGYL) tablet 500 mg 500 mg    12/07/13 0093 Given   metroNIDAZOLE (FLAGYL) tablet 500 mg 500 mg    12/07/13 8182 Given   vancomycin (VANCOCIN) IVPB 1000 mg/200 mL premix 1,000 mg 200 mL/hr      Subjective: No new symptoms  Objective: Temp:  [97.8 F (36.6 C)-98.4 F (36.9 C)] 98.2 F (36.8 C) (01/20 0640) Pulse Rate:  [65-95] 65 (01/20 0640) Resp:  [18-20] 18 (01/20 0640) BP: (114-141)/(75-85) 114/75 mmHg (01/20 0640) SpO2:  [95 %-97 %] 96 % (01/20 0640)  General: Awake, alert, nad, up in chair Skin: no  rashes Lungs: CTA B Cor: RRR with 2/6 SEM Abdomen: mild tenderness bilateral in pelvis but otherwise soft, normoactive bowel sounds Ext: no edema Back: no spinous tenderness, no spasm  Lab Results Lab Results  Component Value Date   WBC 5.9 12/07/2013   HGB 11.2* 12/07/2013   HCT 34.7* 12/07/2013   MCV 87.8 12/07/2013   PLT 301 12/07/2013    Lab Results  Component Value Date   CREATININE 0.90 12/07/2013   BUN 10 12/07/2013   NA 140 12/07/2013   K 4.4 12/07/2013   CL 101 12/07/2013   CO2 28 12/07/2013    Lab Results  Component Value Date   ALT 15 12/05/2013   AST 19 12/05/2013   ALKPHOS 123* 12/05/2013   BILITOT 0.3 12/05/2013      Microbiology: Recent Results (from the past 240 hour(s))  CULTURE, BLOOD (ROUTINE X 2)     Status: None   Collection Time    12/03/13  1:15 PM      Result Value Range Status   Specimen Description BLOOD RIGHT FOREARM  5 ML IN [facility] BOTTLE   Final   Special Requests NONE  Final   Culture  Setup Time     Final   Value: 12/03/2013 19:43     Performed at [facility]   Culture     Final   Value:        BLOOD CULTURE RECEIVED NO GROWTH TO DATE CULTURE WILL BE HELD FOR 5 DAYS BEFORE ISSUING A FINAL NEGATIVE REPORT     Performed at [facility]   Report Status PENDING   Incomplete  CULTURE, BLOOD (ROUTINE X 2)     Status: None   Collection Time    12/03/13  2:09 PM      Result Value Range Status   Specimen Description BLOOD RIGHT FOREARM  5 ML IN [facility] BOTTLE   Final   Special Requests NONE   Final   Culture  Setup Time     Final   Value: 12/03/2013 19:43     Performed at [facility]   Culture     Final   Value:        BLOOD CULTURE RECEIVED NO GROWTH TO DATE CULTURE WILL BE HELD FOR 5 DAYS BEFORE ISSUING A FINAL NEGATIVE REPORT     Performed at [facility]   Report Status PENDING   Incomplete  CULTURE, ROUTINE-ABSCESS     Status: None   Collection Time    12/04/13  4:19 PM      Result Value Range Status   Specimen  Description LIVER   Final   Special Requests NONE   Final   Gram Stain     Final   Value: NO WBC SEEN     NO SQUAMOUS EPITHELIAL CELLS SEEN     NO ORGANISMS SEEN     Performed at [facility]   Culture     Final   Value: NO GROWTH 2 DAYS     Performed at [facility]   Report Status PENDING   Incomplete    Studies/Results: Mr Jeri Cos Wo Contrast  12/06/2013   CLINICAL DATA:  Bile duct cancer post resection 2009. Fever. Headaches. Diabetic hypertensive hyperlipidemia patient.  EXAM: MRI HEAD WITHOUT AND WITH CONTRAST  TECHNIQUE: Multiplanar, multiecho pulse sequences of the brain and surrounding structures were obtained without and with intravenous contrast.  CONTRAST:  14 cc MultiHance.  COMPARISON:  11/19/2013 CT.  No comparison brain MR.  FINDINGS: No acute infarct.  No intracranial hemorrhage.  Minimal nonspecific white matter type changes without MR evidence of intracranial infection. If this is of high clinical concern, correlation with cerebral spinal fluid analysis may then be considered.  Prominent peri vascular spaces versus remote basal ganglia lacune infarcts.  No intracranial mass or abnormal enhancement.  Major intracranial vascular structures are patent.  Cervical medullary junction, pituitary region, pineal region and orbital structures unremarkable.  Minimal mucosal thickening inferior aspect right maxillary sinus.  IMPRESSION: No acute infarct.  Minimal nonspecific white matter type changes without MR evidence of intracranial infection. If this is of high clinical concern, correlation with cerebral spinal fluid analysis may then be considered.  Prominent peri vascular spaces versus remote basal ganglia lacune infarcts.  No intracranial mass or abnormal enhancement.   Electronically Signed   By: Chauncey Cruel M.D.   On: 12/06/2013 17:08   Mr Lumbar Spine W Wo Contrast  12/06/2013   CLINICAL DATA:  Bile duct cancer. Fever. Back pain. Query abscess/ infection.  EXAM:  MRI LUMBAR SPINE WITHOUT AND WITH CONTRAST  TECHNIQUE: Multiplanar and multiecho pulse  sequences of the lumbar spine were obtained without and with intravenous contrast.  CONTRAST:  24mL MULTIHANCE GADOBENATE DIMEGLUMINE 529 MG/ML IV SOLN  COMPARISON:  MR ABDOMEN WO/W CM dated 11/25/2013; CT ABD/PELVIS W CM dated 12/03/2013  FINDINGS: The lowest lumbar type non-rib-bearing vertebra is labeled as L5. The conus medullaris appears normal. Conus level: L1-2.  No vertebral subluxation is observed. Intervertebral disc desiccation is observed at L4-5. No significant abnormal lumbar vertebral edema or vertebral enhancement identified.  Right kidney upper pole atrophy/scarring noted. No findings of discitis -osteomyelitis. No significant abnormal epidural process. No clumping of nerve roots to suggest arachnoiditis.  Despite efforts by the technologist and patient, motion artifact is present on today's exam and could not be eliminated. This reduces exam sensitivity and specificity. Additional findings at individual levels are as follows:  L1-2:  Unremarkable.  L2-3:  No impingement.  Left facet arthropathy.  L3-4:  Unremarkable.  L4-5: No impingement. Minimal disc bulge with right foraminal annular tear.  L5-S1:  No impingement.  Right greater than left facet arthropathy.  IMPRESSION: 1. No lumbar impingement or findings suspicious for infection or malignancy involving the lumbar spine. 2. Mild lumbar spondylosis and degenerative disc disease.   Electronically Signed   By: Sherryl Barters M.D.   On: 12/06/2013 16:55    Assessment/Plan: 1) FUO - WBC and fever seem to correlate and respond to IV antibiotics, did not respond to levaquin.  Source elusive.  No obvious localizing signs.  MRI negative for infection.  Has had extensive work up with CT of head, C/A/P.  Liver biopsy seems more likely tumor, no WBCs.  Labs do not suggest any issue with normal biliary and liver tests including normal INR;TTE ok.  Some cough but mild  and no opacity. She did have altered mental status with SIRS on presentation.   -at this point, with no localizing signs, I would recommend treatment with current antibiotics for an extended course of about 2 more weeks and then stop and observe off antibiotics.  I would anticipate after a 3-4 days if she is doing well she would be clear of infection.     [name], [place] for Infectious Disease [place] [email or web address] [phone] pager   [phone] cell 12/07/2013, 9:32 AM

## 2013-12-07 NOTE — Progress Notes (Signed)
PROGRESS NOTE  JI FAIRBURN FMM:[id number] DOB: 1956/04/13 DOA: 12/03/2013 PCP: [name], MD  Assessment/Plan: Sepsis without identifiable source- Patient met criteria for sepsis on admission given leukocytosis, fever, RR>20, tachycardia and lactic acidosis; sepsis physiology improved following IV Abx. Blood cultures pending, NGTD. Continue Vancomycin and Cefepime for now. She is post recent hospitalization requiring broad spectrum antibiotics which were narrowed to Levaquin but she became septic on that at home.  - IR consulted for sampling liver mass/abscess s/p biopsy on 1/17, without frank puss and based on appearance likely cancer. ID consulted, appreciate input since there is no clear source. Mild cough, will send sputum.  - concern for back pain, MRI ordered per ID, negative - after discussing with ID, will plan at tentatively at this point to treat with IV Abx for 2 weeks. PICC line ordered today.  Malignant neoplasm of extrahepatic bile ducts - follows with Dr. Charlton Amor. Will add in Epic. New mass biopsied, awaiting pathology.  DM Hold home medications. We'll place on sliding scale insulin  Pancreatic insufficiency Continue pancreatic enzymes  Unspecified essential hypertension Hold home blood pressure medications.  Hypothyroidism Continue Synthroid  Diet: diabetic Fluids: NS DVT Prophylaxis: Lovenox  Code Status: Full Family Communication: none  Disposition Plan: inpatient  Consultants:  ID  IR  Procedures:  none   Antibiotics Vancomycin 1/16 >> Cefepime 1/16 >>  HPI/Subjective: - feeling better today.  Objective: Filed Vitals:   12/06/13 0800 12/06/13 1319 12/06/13 2106 12/07/13 0640  BP: 137/72 141/85 132/77 114/75  Pulse: 68 95 73 65  Temp: 98.2 F (36.8 C) 97.8 F (36.6 C) 98.4 F (36.9 C) 98.2 F (36.8 C)  TempSrc: Oral Oral Oral Oral  Resp: '14 20 19 18  ' Height:      Weight:      SpO2: 98% 97% 95% 96%    Intake/Output Summary (Last  24 hours) at 12/07/13 1253 Last data filed at 12/07/13 1100  Gross per 24 hour  Intake   1520 ml  Output      0 ml  Net   1520 ml   Filed Weights   12/03/13 1531  Weight: 70.761 kg (156 lb)    Exam:  General:  NAD  Cardiovascular: regular rate and rhythm, soft 2/6 SEM  Respiratory: good air movement, clear to auscultation throughout, no wheezing, ronchi or rales  Abdomen: soft, RUQ and epigatric tenderness to palpation, positive bowel sounds  MSK: no peripheral edema  Neuro: non focal  Data Reviewed: Basic Metabolic Panel:  Recent Labs Lab 12/03/13 1315 12/04/13 0510 12/05/13 0540 12/06/13 0518 12/07/13 0440  NA 137 141 136* 139 140  K 3.5* 4.1 3.8 3.6* 4.4  CL 96 105 97 99 101  CO2 '25 23 25 28 28  ' GLUCOSE 176* 169* 155* 183* 166*  BUN '12 11 10 12 10  ' CREATININE 0.91 1.02 1.07 0.90 0.90  CALCIUM 8.8 8.3* 8.6 8.4 9.0   Liver Function Tests:  Recent Labs Lab 12/04/13 0510 12/05/13 0540  AST 14 19  ALT 13 15  ALKPHOS 131* 123*  BILITOT 0.2* 0.3  PROT 5.7* 6.2  ALBUMIN 2.2* 2.4*   CBC:  Recent Labs Lab 12/03/13 1315 12/04/13 0510 12/05/13 0540 12/06/13 0518 12/07/13 0440  WBC 16.5* 23.2* 13.7* 6.5 5.9  NEUTROABS 15.7*  --  12.0*  --   --   HGB 10.2* 9.8* 10.8* 10.4* 11.2*  HCT 31.5* 31.1* 33.7* 32.6* 34.7*  MCV 89.0 89.6 88.9 88.8 87.8  PLT 261 249 275  261 301   CBG:  Recent Labs Lab 12-10-2013 1137 10-Dec-2013 1700 2013-12-10 2118 12/07/13 0722 12/07/13 1145  GLUCAP 275* 254* 244* 148* 270*    Recent Results (from the past 240 hour(s))  CULTURE, BLOOD (ROUTINE X 2)     Status: None   Collection Time    12/03/13  1:15 PM      Result Value Range Status   Specimen Description BLOOD RIGHT FOREARM  5 ML IN [facility] BOTTLE   Final   Special Requests NONE   Final   Culture  Setup Time     Final   Value: 12/03/2013 19:43     Performed at [facility]   Culture     Final   Value:        BLOOD CULTURE RECEIVED NO GROWTH TO DATE CULTURE  WILL BE HELD FOR 5 DAYS BEFORE ISSUING A FINAL NEGATIVE REPORT     Performed at [facility]   Report Status PENDING   Incomplete  CULTURE, BLOOD (ROUTINE X 2)     Status: None   Collection Time    12/03/13  2:09 PM      Result Value Range Status   Specimen Description BLOOD RIGHT FOREARM  5 ML IN [facility] BOTTLE   Final   Special Requests NONE   Final   Culture  Setup Time     Final   Value: 12/03/2013 19:43     Performed at [facility]   Culture     Final   Value:        BLOOD CULTURE RECEIVED NO GROWTH TO DATE CULTURE WILL BE HELD FOR 5 DAYS BEFORE ISSUING A FINAL NEGATIVE REPORT     Performed at [facility]   Report Status PENDING   Incomplete  CULTURE, ROUTINE-ABSCESS     Status: None   Collection Time    12/04/13  4:19 PM      Result Value Range Status   Specimen Description LIVER   Final   Special Requests NONE   Final   Gram Stain     Final   Value: NO WBC SEEN     NO SQUAMOUS EPITHELIAL CELLS SEEN     NO ORGANISMS SEEN     Performed at [facility]   Culture     Final   Value: NO GROWTH 2 DAYS     Performed at [facility]   Report Status PENDING   Incomplete     Studies: Mr Jeri Cos Wo Contrast  2013-12-10   CLINICAL DATA:  Bile duct cancer post resection 2009. Fever. Headaches. Diabetic hypertensive hyperlipidemia patient.  EXAM: MRI HEAD WITHOUT AND WITH CONTRAST  TECHNIQUE: Multiplanar, multiecho pulse sequences of the brain and surrounding structures were obtained without and with intravenous contrast.  CONTRAST:  14 cc MultiHance.  COMPARISON:  11/19/2013 CT.  No comparison brain MR.  FINDINGS: No acute infarct.  No intracranial hemorrhage.  Minimal nonspecific white matter type changes without MR evidence of intracranial infection. If this is of high clinical concern, correlation with cerebral spinal fluid analysis may then be considered.  Prominent peri vascular spaces versus remote basal ganglia lacune infarcts.  No  intracranial mass or abnormal enhancement.  Major intracranial vascular structures are patent.  Cervical medullary junction, pituitary region, pineal region and orbital structures unremarkable.  Minimal mucosal thickening inferior aspect right maxillary sinus.  IMPRESSION: No acute infarct.  Minimal nonspecific white matter type changes without MR evidence of  intracranial infection. If this is of high clinical concern, correlation with cerebral spinal fluid analysis may then be considered.  Prominent peri vascular spaces versus remote basal ganglia lacune infarcts.  No intracranial mass or abnormal enhancement.   Electronically Signed   By: Chauncey Cruel M.D.   On: 12/06/2013 17:08   Mr Lumbar Spine W Wo Contrast  12/06/2013   CLINICAL DATA:  Bile duct cancer. Fever. Back pain. Query abscess/ infection.  EXAM: MRI LUMBAR SPINE WITHOUT AND WITH CONTRAST  TECHNIQUE: Multiplanar and multiecho pulse sequences of the lumbar spine were obtained without and with intravenous contrast.  CONTRAST:  16m MULTIHANCE GADOBENATE DIMEGLUMINE 529 MG/ML IV SOLN  COMPARISON:  MR ABDOMEN WO/W CM dated 11/25/2013; CT ABD/PELVIS W CM dated 12/03/2013  FINDINGS: The lowest lumbar type non-rib-bearing vertebra is labeled as L5. The conus medullaris appears normal. Conus level: L1-2.  No vertebral subluxation is observed. Intervertebral disc desiccation is observed at L4-5. No significant abnormal lumbar vertebral edema or vertebral enhancement identified.  Right kidney upper pole atrophy/scarring noted. No findings of discitis -osteomyelitis. No significant abnormal epidural process. No clumping of nerve roots to suggest arachnoiditis.  Despite efforts by the technologist and patient, motion artifact is present on today's exam and could not be eliminated. This reduces exam sensitivity and specificity. Additional findings at individual levels are as follows:  L1-2:  Unremarkable.  L2-3:  No impingement.  Left facet arthropathy.  L3-4:   Unremarkable.  L4-5: No impingement. Minimal disc bulge with right foraminal annular tear.  L5-S1:  No impingement.  Right greater than left facet arthropathy.  IMPRESSION: 1. No lumbar impingement or findings suspicious for infection or malignancy involving the lumbar spine. 2. Mild lumbar spondylosis and degenerative disc disease.   Electronically Signed   By: WSherryl BartersM.D.   On: 12/06/2013 16:55   Scheduled Meds: . calcium-vitamin D  1 tablet Oral TID WC  . ceFEPime (MAXIPIME) IV  2 g Intravenous Q12H  . enoxaparin (LOVENOX) injection  40 mg Subcutaneous Q2200  . furosemide  40 mg Oral BID  . insulin aspart  0-15 Units Subcutaneous TID WC  . iron polysaccharides  150 mg Oral Daily  . levothyroxine  100 mcg Oral Q48H  . levothyroxine  200 mcg Oral Q48H  . lipase/protease/amylase  1 capsule Oral TID AC  . loratadine  10 mg Oral Daily  . magnesium oxide  200 mg Oral TID PC & HS  . metroNIDAZOLE  500 mg Oral Q8H  . multivitamin with minerals  1 tablet Oral Daily  . pantoprazole  40 mg Oral Daily  . potassium chloride SA  20 mEq Oral BID  . sodium chloride  3 mL Intravenous Q12H  . vancomycin  1,000 mg Intravenous Q12H  . Vitamin D (Ergocalciferol)  50,000 Units Oral QPM   Continuous Infusions: . sodium chloride 100 mL/hr at 12/07/13 02244  Principal Problem:   SIRS (systemic inflammatory response syndrome) Active Problems:   Malignant neoplasm of extrahepatic bile ducts   DM   PANCREATIC INSUFFICIENCY   Unspecified essential hypertension   Intrinsic asthma   History of Roux-en-Y gastric bypass   Fever   Hypothyroidism   Lactic acidosis   Sepsis  Time spent: 2[place] MD [facility] Pager [phone] If 7 PM - 7 AM, please contact night-coverage at [email or web address], password [facility]1/[facility]/2015, 12:53 PM  LOS: 4 days

## 2013-12-08 ENCOUNTER — Ambulatory Visit: Payer: Self-pay | Admitting: Emergency Medicine

## 2013-12-08 DIAGNOSIS — M7989 Other specified soft tissue disorders: Secondary | ICD-10-CM

## 2013-12-08 LAB — GLUCOSE, CAPILLARY
GLUCOSE-CAPILLARY: 167 mg/dL — AB (ref 70–99)
GLUCOSE-CAPILLARY: 286 mg/dL — AB (ref 70–99)
GLUCOSE-CAPILLARY: 181 mg/dL — AB (ref 70–99)
GLUCOSE-CAPILLARY: 195 mg/dL — AB (ref 70–99)
Glucose-Capillary: 398 mg/dL — ABNORMAL HIGH (ref 70–99)
Glucose-Capillary: 96 mg/dL (ref 70–99)

## 2013-12-08 LAB — BASIC METABOLIC PANEL
BUN: 7 mg/dL (ref 6–23)
CHLORIDE: 100 meq/L (ref 96–112)
CO2: 25 mEq/L (ref 19–32)
Calcium: 8.3 mg/dL — ABNORMAL LOW (ref 8.4–10.5)
Creatinine, Ser: 0.85 mg/dL (ref 0.50–1.10)
GFR calc Af Amer: 86 mL/min — ABNORMAL LOW (ref >90)
GFR, EST NON AFRICAN AMERICAN: 74 mL/min — AB (ref >90)
Glucose, Bld: 197 mg/dL — ABNORMAL HIGH (ref 70–99)
Potassium: 4 mEq/L (ref 3.7–5.3)
SODIUM: 135 meq/L — AB (ref 137–147)

## 2013-12-08 LAB — CBC
HCT: 33 % — ABNORMAL LOW (ref 36.0–46.0)
Hemoglobin: 10.7 g/dL — ABNORMAL LOW (ref 12.0–15.0)
MCH: 28.4 pg (ref 26.0–34.0)
MCHC: 32.4 g/dL (ref 30.0–36.0)
MCV: 87.5 fL (ref 78.0–100.0)
PLATELETS: 265 10*3/uL (ref 150–400)
RBC: 3.77 MIL/uL — ABNORMAL LOW (ref 3.87–5.11)
RDW: 14.7 % (ref 11.5–15.5)
WBC: 11.4 10*3/uL — AB (ref 4.0–10.5)

## 2013-12-08 LAB — CULTURE, ROUTINE-ABSCESS
Culture: NO GROWTH
Gram Stain: NONE SEEN

## 2013-12-08 LAB — EXPECTORATED SPUTUM ASSESSMENT W REFEX TO RESP CULTURE

## 2013-12-08 LAB — EXPECTORATED SPUTUM ASSESSMENT W GRAM STAIN, RFLX TO RESP C

## 2013-12-08 LAB — VANCOMYCIN, TROUGH: VANCOMYCIN TR: 17.9 ug/mL (ref 10.0–20.0)

## 2013-12-08 MED ORDER — SODIUM CHLORIDE 0.9 % IJ SOLN
10.0000 mL | INTRAMUSCULAR | Status: DC | PRN
  Administered 2013-12-09 – 2013-12-13 (×2): 10 mL

## 2013-12-08 MED ORDER — INSULIN ASPART 100 UNIT/ML ~~LOC~~ SOLN
4.0000 [IU] | Freq: Three times a day (TID) | SUBCUTANEOUS | Status: DC
Start: 2013-12-08 — End: 2013-12-13
  Administered 2013-12-08 – 2013-12-13 (×14): 4 [IU] via SUBCUTANEOUS

## 2013-12-08 MED ORDER — IBUPROFEN 800 MG PO TABS
800.0000 mg | ORAL_TABLET | Freq: Once | ORAL | Status: AC
  Administered 2013-12-08: 800 mg via ORAL
  Filled 2013-12-08: qty 1

## 2013-12-08 NOTE — Progress Notes (Signed)
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 Birdie Sons at [facility] center on 02/22/2008. She underwent a radical resection for node-positive adenocarcinoma of the bile duct with positive lymph node involvement (2 out of 17 lymph nodes) with positive surgical margins.  -appreciated Dr. Juliann Mule followup -Await pathology from liver biopsy-12/04/13 DM2 -12/07/2013 hemoglobin A1c 7.9 -Continue Humalog sliding scale -Add NovoLog 4 units before each meal Pancreatic insufficiency -Continue Creon -Patient is eating well Hypothyroidism -Continue Synthroid Anemia of chronic disease -Hemoglobin is stable   Family Communication:   Pt at beside Disposition Plan:   Home when medically stable       Procedures/Studies: Dg Chest 2 View  12/03/2013   CLINICAL DATA:  Fever, cough.  EXAM: CHEST  2 VIEW  COMPARISON:  November 25, 2013.  FINDINGS: The heart size and mediastinal contours are within normal limits. Both lungs are clear. No pleural effusion or pneumothorax is noted. The visualized skeletal structures are unremarkable.  IMPRESSION: No active cardiopulmonary disease.   Electronically Signed   By: [name] M.D.   On: 12/03/2013 14:14   Dg Chest 2 View  11/25/2013   CLINICAL DATA:  Mental status change, fever and chills, history of biliary tract cancer  EXAM: CHEST  2 VIEW   COMPARISON:  CT scan of this thorax dated November 19, 2013 and chest x-ray of the same day.  FINDINGS: The lungs are adequately inflated. There is no focal infiltrate. There is mild symmetric prominence of the pulmonary interstitial markings bilaterally. There is no pleural effusion. The cardiopericardial silhouette is normal in size. The pulmonary vascularity is not engorged. The mediastinum is normal in width very there is no pleural effusion. The observed portions of the bony thorax exhibit no acute abnormalities. There are surgical clips in the left upper quadrant of the abdomen.  IMPRESSION: There is no evidence of pneumonia nor CHF nor other acute cardiopulmonary abnormality. Mild prominence of the pulmonary interstitial markings bilaterally may be related to the patient's previous smoking history.   Electronically Signed   By: Metzli Pollick  [place]   On: 11/25/2013 16:18   Dg Chest 2 View  11/19/2013   CLINICAL DATA:  Shortness of breath and headache.  EXAM: CHEST  2 VIEW  COMPARISON:  03/02/2010  FINDINGS: Lungs are adequately inflated without consolidation or effusion. Cardiomediastinal silhouette is within normal. There is mild spondylosis of the spine. Multiple surgical clips over the abdomen.  IMPRESSION: No active cardiopulmonary disease.   Electronically Signed   By: Marin Olp M.D.   On: 11/19/2013 13:25   Ct Head Wo Contrast  11/19/2013   CLINICAL DATA:  Headache, no known injury; history of adenocarcinoma of the bile duct  EXAM: CT HEAD WITHOUT CONTRAST  TECHNIQUE: Contiguous axial images were obtained from the base of the skull through the vertex without intravenous contrast.  COMPARISON:  PET-CT -06/12/2009  FINDINGS: Gray-white differentiation is maintained. Incidental note is made of prominent VR spaces within the caudal medial aspects of the bilateral temporal lobes. No CT evidence  for acute large territory infarct. No intraparenchymal or extra-axial mass or hemorrhage. Normal size and  configuration of the ventricles and basilar cisterns. No midline. 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 The remaining paranasal sinuses mastoid air cells appear normally aerated. Regional soft tissues appear normal. No displaced calvarial fracture.  IMPRESSION: Negative noncontrast head CT.   Electronically Signed   By: [name] M.D.   On: 11/19/2013 12:58   Ct Angio Chest Pe W/cm &/or Wo Cm  11/19/2013   CLINICAL DATA:  Shortness of breath, chest pain.  EXAM: CT ANGIOGRAPHY CHEST WITH CONTRAST  TECHNIQUE: Multidetector CT imaging of the chest was performed using the standard protocol during bolus administration of intravenous contrast. Multiplanar CT image reconstructions including MIPs were obtained to evaluate the vascular anatomy.  CONTRAST:  168mL OMNIPAQUE IOHEXOL 350 MG/ML SOLN  COMPARISON:  CT scan of Apr 14, 2013.  FINDINGS: No pleural effusion or pneumothorax is noted. No significant pulmonary parenchymal abnormality is noted. The thoracic aorta appears normal without evidence of dissection or aneurysm. Pulmonary arteries appear normal. There is no evidence of pulmonary embolus no significant mediastinal adenopathy is noted. Ill-defined low-density measuring 4 cm is seen in the posterior segment of the right hepatic lobe ; further evaluation with MRI is recommended. No osseous abnormality is noted in the chest  Review of the MIP images confirms the above findings.  IMPRESSION: No evidence of pulmonary embolus. No acute pulmonary disease is noted. 4 cm ill-defined low density is noted in the posterior segment of the right hepatic lobe concerning for possible neoplasm ; further evaluation with MRI is recommended.   Electronically Signed   By: [name] M.D.   On: 11/19/2013 14:58   Mr Jeri Cos [id number] Contrast  12/06/2013   CLINICAL DATA:  Bile duct cancer post resection 2009. Fever. Headaches. Diabetic  hypertensive hyperlipidemia patient.  EXAM: MRI HEAD WITHOUT AND WITH CONTRAST  TECHNIQUE: Multiplanar, multiecho pulse sequences of the brain and surrounding structures were obtained without and with intravenous contrast.  CONTRAST:  14 cc MultiHance.  COMPARISON:  11/19/2013 CT.  No comparison brain MR.  FINDINGS: No acute infarct.  No intracranial hemorrhage.  Minimal nonspecific white matter type changes without MR evidence of intracranial infection. If this is of high clinical concern, correlation with cerebral spinal fluid analysis may then be considered.  Prominent peri vascular spaces versus remote basal ganglia lacune infarcts.  No intracranial mass or abnormal enhancement.  Major intracranial vascular structures are patent.  Cervical medullary junction, pituitary region, pineal region and orbital structures unremarkable.  Minimal mucosal thickening inferior aspect right maxillary sinus.  IMPRESSION: No acute infarct.  Minimal nonspecific white matter type changes without MR evidence of intracranial infection. If this is of high clinical concern, correlation with cerebral spinal fluid analysis may then be considered.  Prominent peri vascular spaces versus remote basal ganglia lacune infarcts.  No intracranial mass or abnormal enhancement.   Electronically Signed   By: Chauncey Cruel M.D.   On: 12/06/2013 17:08   Mr Lumbar Spine W Wo Contrast  12/06/2013   CLINICAL DATA:  Bile duct cancer. Fever. Back pain. Query abscess/ infection.  EXAM: MRI LUMBAR SPINE WITHOUT AND WITH CONTRAST  TECHNIQUE: Multiplanar and multiecho pulse sequences of the lumbar spine were obtained without and with intravenous contrast.  CONTRAST:  28mL MULTIHANCE GADOBENATE DIMEGLUMINE 529 MG/ML IV SOLN  COMPARISON:  MR ABDOMEN  WO/W CM dated 11/25/2013; CT ABD/PELVIS W CM dated 12/03/2013  FINDINGS: The lowest lumbar type non-rib-bearing vertebra is labeled as L5. The conus medullaris appears normal. Conus level: L1-2.  No vertebral  subluxation is observed. Intervertebral disc desiccation is observed at L4-5. No significant abnormal lumbar vertebral edema or vertebral enhancement identified.  Right kidney upper pole atrophy/scarring noted. No findings of discitis -osteomyelitis. No significant abnormal epidural process. No clumping of nerve roots to suggest arachnoiditis.  Despite efforts by the technologist and patient, motion artifact is present on today's exam and could not be eliminated. This reduces exam sensitivity and specificity. Additional findings at individual levels are as follows:  L1-2:  Unremarkable.  L2-3:  No impingement.  Left facet arthropathy.  L3-4:  Unremarkable.  L4-5: No impingement. Minimal disc bulge with right foraminal annular tear.  L5-S1:  No impingement.  Right greater than left facet arthropathy.  IMPRESSION: 1. No lumbar impingement or findings suspicious for infection or malignancy involving the lumbar spine. 2. Mild lumbar spondylosis and degenerative disc disease.   Electronically Signed   By: Sherryl Barters M.D.   On: 12/06/2013 16:55   Mr Abdomen W Wo Contrast  11/25/2013   CLINICAL DATA:  Abdominal pain with nausea, vomiting and fever. 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 Liver lesion on CT.  EXAM: MRI ABDOMEN WITHOUT AND WITH CONTRAST  TECHNIQUE: Multiplanar multisequence MR imaging of the abdomen was performed both before and after the administration of intravenous contrast.  CONTRAST:  78mL MULTIHANCE GADOBENATE DIMEGLUMINE 529 MG/ML IV SOLN  COMPARISON:  CT ABD/PELVIS W CM dated 02/19/2013; CT ABD/PELVIS W CM dated 08/14/2012; MR ABDOMEN WO/W CM dated 03/06/2010; CT ANGIO CHEST W/CM &/OR WO/CM dated 11/19/2013  FINDINGS: The previously demonstrated diffuse severe hepatic steatosis has improved. Medially within the right hepatic lobe at the site of the previous geographic sparing of steatosis is a new heterogeneous mass. This demonstrates peripheral heterogeneous enhancement  following contrast, measuring approximately 5.6 x 3.7 cm transverse on image 23 of series 1201. There is inferior extension of tumor along the posteromedial hepatic capsule. There is extrinsic mass effect on the IVC which is narrowed, but patent. The hepatic and portal veins are patent. The dominant liver lesion demonstrates central cystic or necrotic components which do not enhance.  No other focal liver lesions are identified. There is stable mild intrahepatic biliary dilatation status post choledochojejunostomy. There is stable atrophy of the pancreatic body and tail. Soft tissue stranding and enhancement surrounding the duodenum and IVC are similar to the prior examination, likely postsurgical in etiology.  The spleen, adrenal glands and kidneys demonstrate no significant findings. There is no hydronephrosis. There is upper abdominal adenopathy within the portacaval space and retroperitoneum. There is a 1.4 cm aortic caval node which demonstrates central necrosis. There is a new enhancing 11 mm nodule inferior to the right hepatic lobe which may reflect a small peritoneal implant. No significant ascites is seen.  IMPRESSION: 1. As demonstrated on recent Chest CT, there is a new peripherally enhancing mass posteromedially within the right hepatic lobe, highly worrisome for local recurrence or metastatic disease from the patient's bile duct adenocarcinoma. A liver abscess could have this appearance, although that possibility is considered much less likely. 2. Associated necrotic upper abdominal adenopathy, also worrisome for metastatic disease. Possible small peritoneal implant adjacent to the right hepatic lobe. 3. No significant biliary dilatation. 4. Extrinsic mass effect on the IVC without evidence of occlusion.   Electronically Signed  By: Camie Patience M.D.   On: 11/25/2013 18:37   Ct Abdomen Pelvis W Contrast  12/03/2013   CLINICAL DATA:  History of Whipple procedure for bile duct carcinoma. New sepsis.  New liver lesion  EXAM: CT ABDOMEN AND PELVIS WITH CONTRAST  TECHNIQUE: Multidetector CT imaging of the abdomen and pelvis was performed using the standard protocol following bolus administration of intravenous contrast.  CONTRAST:  162mL OMNIPAQUE IOHEXOL 300 MG/ML  SOLN  COMPARISON:  MR ABDOMEN WO/W CM dated 11/25/2013  FINDINGS: Lung bases are clear.  No pericardial fluid.  Again demonstrated a large subcapsular lesion within the posterior right hepatic lobe measuring 4.5 x 4.5 cm similar to 4.8 x 4.3 cm on comparison MRI. This lesion continues demonstrate mild peripheral enhancement and central heterogeneity. No additional hepatic lesions. There is pneumobilia in the left hepatic lobe consistent with choledochojejunostomy. There is some edema involving the bowel along the porta hepatis and small amount of fluid small of fluid along Morrison's pouch is similar prior.  Small nodule inferior right hepatic lobe measuring 8 mm unchanged (image 31, series 2 ). The body and tail of the pancreas normal. The spleen, adrenal glands, kidneys are normal. There is some cortical thinning on the right.  Small bowel and colon are unremarkable. There is no evidence of abscess in the pelvis.  Post hysterectomy anatomy. The bladder is normal. No pelvic lymphadenopathy. No aggressive osseous lesion.  IMPRESSION: 1. No change in large enhancing lesion the posterior aspect of the right hepatic lobe. In patient with sepsis of unknown origin would consider hepatic abscess as a distinct possibility although adenocarcinoma recurrence cannot be excluded. Recommend ultrasound guided percutaneous biopsy of this lesion to differentiate between abscess and malignancy. Favor abscess. 2. Edema along the proximal bowel and Morrison's pouch likely related to the hepatic process. 3. Patient status post a Whipple procedure without complicating features. This was made a call report.   Electronically Signed   By: Suzy Bouchard M.D.   On: 12/03/2013  20:18   US Biopsy  12/04/2013   CLINICAL DATA:  58 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 Patient has an elevated white count and chills. Evaluate for recurrent cancer versus abscess.  EXAM: ULTRASOUND GUIDED BIOPSY OF THE LIVER LESION  MEDICATIONS: 2.0 mg IV Versed; 100 mcg IV Fentanyl. A radiology nurse monitored the patient throughout the procedure.  Total Moderate Sedation Time: 25 min  PROCEDURE: The procedure, risks, benefits, and alternatives were explained to the patient. Questions regarding the procedure were encouraged and answered. The patient understands and consents to the procedure.  The right upper abdomen was prepped with chlorhexidine. A sterile drape was placed. The skin and liver capsule were anesthetized with 1% lidocaine. A 17 gauge needle was directed into the posterior right hepatic lesion and tried to aspirate fluid from the 17 gauge needle. No purulent fluid was obtained. A small amount of bloody fluid was collected. Three core biopsies were obtained with an 18 gauge device. Specimens were placed in formalin. Needle removed without complication.  COMPLICATIONS: None.  FINDINGS: Heterogeneous hypoechoic lesion in the posterior right hepatic lobe. Needle placement was confirmed within the lesion. Following the first core biopsy, there was development of multiple echogenic foci throughout the lesion. Echogenic foci probably represent gas from the biliary system. No significant bleeding.  IMPRESSION: Ultrasound-guided core biopsies of the liver lesion. Solid core material was obtained from the biopsies and no purulent fluid could be obtained. Findings are suspicious for  recurrent cancer rather than abscess. A sample of the aspirated blood was sent for Gram stain and culture.   Electronically Signed   By: Markus Daft M.D.   On: 12/04/2013 16:36         Subjective: Patient spiked another fever early in the morning. She had some subjective chills. Denies any  headache, chest pain, shortness of breath, nausea, vomiting, diarrhea, abdominal pain, dysuria, hematuria. No rashes.   Objective: Filed Vitals:   12/07/13 1506 12/07/13 2144 12/08/13 0426 12/08/13 0653  BP: 126/63 142/73 153/81   Pulse: 81 65 81   Temp: 99.1 F (37.3 C) 98.1 F (36.7 C) 100.9 F (38.3 C) 101.4 F (38.6 C)  TempSrc: Oral Oral Oral   Resp: 20 18 17    Height:      Weight:      SpO2: 98% 98% 98%     Intake/Output Summary (Last 24 hours) at 12/08/13 1132 Last data filed at 12/08/13 1113  Gross per 24 hour  Intake 3308.33 ml  Output      0 ml  Net 3308.33 ml   Weight change:  Exam:   General:  Pt is alert, follows commands appropriately, not in acute distress  HEENT: No icterus, No thrush,  [place]/AT  Cardiovascular: RRR, S1/S2, no rubs, no gallops  Respiratory: CTA bilaterally, no wheezing, no crackles, no rhonchi  Abdomen: Soft/+BS, non tender, non distended, no guarding  Extremities: trace LE edema, L>R;  No lymphangitis, No petechiae, No rashes, no synovitis  Data Reviewed: Basic Metabolic Panel:  Recent Labs Lab 12/04/13 0510 12/05/13 0540 12/06/13 0518 12/07/13 0440 12/08/13 0640  NA 141 136* 139 140 135*  K 4.1 3.8 3.6* 4.4 4.0  CL 105 97 99 101 100  CO2 23 25 28 28 25   GLUCOSE 169* 155* 183* 166* 197*  BUN 11 10 12 10 7   CREATININE 1.02 1.07 0.90 0.90 0.85  CALCIUM 8.3* 8.6 8.4 9.0 8.3*   Liver Function Tests:  Recent Labs Lab 12/04/13 0510 12/05/13 0540  AST 14 19  ALT 13 15  ALKPHOS 131* 123*  BILITOT 0.2* 0.3  PROT 5.7* 6.2  ALBUMIN 2.2* 2.4*   No results found for this basename: LIPASE, AMYLASE,  in the last 168 hours No results found for this basename: AMMONIA,  in the last 168 hours CBC:  Recent Labs Lab 12/03/13 1315 12/04/13 0510 12/05/13 0540 12/06/13 0518 12/07/13 0440 12/08/13 0640  WBC 16.5* 23.2* 13.7* 6.5 5.9 11.4*  NEUTROABS 15.7*  --  12.0*  --   --   --   HGB 10.2* 9.8* 10.8* 10.4* 11.2* 10.7*   HCT 31.5* 31.1* 33.7* 32.6* 34.7* 33.0*  MCV 89.0 89.6 88.9 88.8 87.8 87.5  PLT 261 249 275 261 301 265   Cardiac Enzymes: No results found for this basename: CKTOTAL, CKMB, CKMBINDEX, TROPONINI,  in the last 168 hours BNP: No components found with this basename: POCBNP,  CBG:  Recent Labs Lab 12/07/13 0722 12/07/13 1145 12/07/13 1655 12/07/13 2150 12/08/13 0736  GLUCAP 148* 270* 167* 286* 181*    Recent Results (from the past 240 hour(s))  CULTURE, BLOOD (ROUTINE X 2)     Status: None   Collection Time    12/03/13  1:15 PM      Result Value Range Status   Specimen Description BLOOD RIGHT FOREARM  5 ML IN [facility] BOTTLE   Final   Special Requests NONE   Final   Culture  Setup Time  Final   Value: 12/03/2013 19:43     Performed at [facility]   Culture     Final   Value:        BLOOD CULTURE RECEIVED NO GROWTH TO DATE CULTURE WILL BE HELD FOR 5 DAYS BEFORE ISSUING A FINAL NEGATIVE REPORT     Performed at [facility]   Report Status PENDING   Incomplete  CULTURE, BLOOD (ROUTINE X 2)     Status: None   Collection Time    12/03/13  2:09 PM      Result Value Range Status   Specimen Description BLOOD RIGHT FOREARM  5 ML IN [facility] BOTTLE   Final   Special Requests NONE   Final   Culture  Setup Time     Final   Value: 12/03/2013 19:43     Performed at [facility]   Culture     Final   Value:        BLOOD CULTURE RECEIVED NO GROWTH TO DATE CULTURE WILL BE HELD FOR 5 DAYS BEFORE ISSUING A FINAL NEGATIVE REPORT     Performed at [facility]   Report Status PENDING   Incomplete  CULTURE, ROUTINE-ABSCESS     Status: None   Collection Time    12/04/13  4:19 PM      Result Value Range Status   Specimen Description LIVER   Final   Special Requests NONE   Final   Gram Stain     Final   Value: NO WBC SEEN     NO SQUAMOUS EPITHELIAL CELLS SEEN     NO ORGANISMS SEEN     Performed at [facility]   Culture     Final   Value: NO GROWTH 2  DAYS     Performed at [facility]   Report Status 12/08/2013 FINAL   Final     Scheduled Meds: . calcium-vitamin D  1 tablet Oral TID WC  . ceFEPime (MAXIPIME) IV  2 g Intravenous Q12H  . enoxaparin (LOVENOX) injection  40 mg Subcutaneous Q2200  . furosemide  40 mg Oral BID  . insulin aspart  0-15 Units Subcutaneous TID WC  . iron polysaccharides  150 mg Oral Daily  . levothyroxine  100 mcg Oral Q48H  . levothyroxine  200 mcg Oral Q48H  . lipase/protease/amylase  1 capsule Oral TID AC  . loratadine  10 mg Oral Daily  . magnesium oxide  200 mg Oral TID PC & HS  . metroNIDAZOLE  500 mg Oral Q8H  . multivitamin with minerals  1 tablet Oral Daily  . pantoprazole  40 mg Oral Daily  . potassium chloride SA  20 mEq Oral BID  . sodium chloride  3 mL Intravenous Q12H  . vancomycin  1,000 mg Intravenous Q12H  . Vitamin D (Ergocalciferol)  50,000 Units Oral QPM   Continuous Infusions: . sodium chloride 100 mL/hr at 12/08/13 0559     Clemons Salvucci, DO  [facility] Pager [phone]  If 7PM-7AM, please contact night-coverage [email or web address] Password [email or web address] 12/08/2013, 11:32 AM   LOS: 5 days

## 2013-12-08 NOTE — Progress Notes (Signed)
Spoke with pt concerning discharge plan and Home Health.  Pt selected [facility] for [facility]. Referral given to in house rep with [facility].

## 2013-12-08 NOTE — Progress Notes (Signed)
VASCULAR LAB PRELIMINARY  PRELIMINARY  PRELIMINARY  PRELIMINARY  Bilateral lower extremity venous duplex  completed.    Preliminary report:  Bilateral:  No evidence of DVT, superficial thrombosis, or Baker's Cyst.    Woodward Klem, RVT 12/08/2013, 3:26 PM

## 2013-12-08 NOTE — Progress Notes (Signed)
ANTIBIOTIC CONSULT NOTE - FOLLOW UP  Pharmacy Consult for Vancomycin, Cefepime Indication: Fever, Empiric therapy  Allergies  Allergen Reactions  . Ace Inhibitors Anaphylaxis  . Tilade [Nedocromil] Anaphylaxis  . Reglan [Metoclopramide] Nausea And Vomiting  . Betadine [Povidone Iodine] Rash    iching     Patient Measurements: Height: 5\' 7"  (170.2 cm) Weight: 156 lb (70.761 kg) IBW/kg (Calculated) : 61.6  Vital Signs: Temp: 101.4 F (38.6 C) (01/21 0653) Temp src: Oral (01/21 0426) BP: 153/81 mmHg (01/21 0426) Pulse Rate: 81 (01/21 0426) Intake/Output from previous day: 01/20 0701 - 01/21 0700 In: 3628.3 [P.O.:1080; I.V.:2098.3; IV Piggyback:450] Out: -   Labs:  Recent Labs  12/06/13 0518 12/07/13 0440 12/08/13 0640  WBC 6.5 5.9 11.4*  HGB 10.4* 11.2* 10.7*  PLT 261 301 265  CREATININE 0.90 0.90 0.85   Estimated Creatinine Clearance: 70.2 ml/min (by C-G formula based on Cr of 0.85).  Recent Labs  12/05/13 1921 12/08/13 0640  VANCOTROUGH 12.5 17.9     Microbiology: 1/16 Blood x 2:  NGTD 1/17: Abscess (liver lesion?) - NGTD 1/19: HIV1 RNA in process 1/19: Negative for Hep B Surface Ag, HCV Ab, Hep A IgM, Hep B C IgM   Anti-infectives: 1/16 >> Vanc >> 1/16 >> Cefepime >> 1/18 >> Metronidazole  >>   Assessment: 55 yoF with bile duct cancer with hepatic lesions suspicious for malignancy now on D6 vancomycin and cefepime (D4 Flagyl per ID consult) for fevers of unknown origin, sepsis. ID is consulted and following.  Tmax: Spiked to 101.4  WBC: WNL > 11.4  Renal: Scr 0.85, CrCl 70 ml/min (stable)  Vancomycin trough level, 17.9, is therapeutic in goal range.   Goal of Therapy:  Vancomycin trough level 15-20 mcg/ml Appropriate abx dosing, eradication of infection.   Plan:   Continue Cefepime 2g IV q12h.  Continue Vancomycin 1g IV q12h.  Recheck Vanc trough as needed.  Follow up renal fxn and culture results as available.   Gretta Arab PharmD, BCPS Pager [phone] 12/08/2013 8:04 AM

## 2013-12-08 NOTE — Progress Notes (Signed)
Peripherally Inserted Central Catheter/Midline Placement  The IV Nurse has discussed with the patient and/or persons authorized to consent for the patient, the purpose of this procedure and the potential benefits and risks involved with this procedure.  The benefits include less needle sticks, lab draws from the catheter and patient may be discharged home with the catheter.  Risks include, but not limited to, infection, bleeding, blood clot (thrombus formation), and puncture of an artery; nerve damage and irregular heat beat.  Alternatives to this procedure were also discussed.  PICC/Midline Placement Documentation        Courtney Stevens 12/08/2013, 3:07 PM

## 2013-12-09 ENCOUNTER — Telehealth: Payer: Self-pay | Admitting: Internal Medicine

## 2013-12-09 DIAGNOSIS — K769 Liver disease, unspecified: Secondary | ICD-10-CM

## 2013-12-09 LAB — CULTURE, BLOOD (ROUTINE X 2)
CULTURE: NO GROWTH
Culture: NO GROWTH

## 2013-12-09 LAB — CBC
HEMATOCRIT: 31 % — AB (ref 36.0–46.0)
HEMOGLOBIN: 9.6 g/dL — AB (ref 12.0–15.0)
MCH: 27.8 pg (ref 26.0–34.0)
MCHC: 31 g/dL (ref 30.0–36.0)
MCV: 89.9 fL (ref 78.0–100.0)
Platelets: 268 10*3/uL (ref 150–400)
RBC: 3.45 MIL/uL — ABNORMAL LOW (ref 3.87–5.11)
RDW: 15 % (ref 11.5–15.5)
WBC: 6.4 10*3/uL (ref 4.0–10.5)

## 2013-12-09 LAB — GLUCOSE, CAPILLARY
Glucose-Capillary: 147 mg/dL — ABNORMAL HIGH (ref 70–99)
Glucose-Capillary: 221 mg/dL — ABNORMAL HIGH (ref 70–99)
Glucose-Capillary: 256 mg/dL — ABNORMAL HIGH (ref 70–99)
Glucose-Capillary: 165 mg/dL — ABNORMAL HIGH (ref 70–99)

## 2013-12-09 LAB — BASIC METABOLIC PANEL
BUN: 7 mg/dL (ref 6–23)
CHLORIDE: 104 meq/L (ref 96–112)
CO2: 27 mEq/L (ref 19–32)
Calcium: 8.3 mg/dL — ABNORMAL LOW (ref 8.4–10.5)
Creatinine, Ser: 0.92 mg/dL (ref 0.50–1.10)
GFR calc Af Amer: 78 mL/min — ABNORMAL LOW (ref >90)
GFR calc non Af Amer: 67 mL/min — ABNORMAL LOW (ref >90)
Glucose, Bld: 151 mg/dL — ABNORMAL HIGH (ref 70–99)
Potassium: 3.6 mEq/L — ABNORMAL LOW (ref 3.7–5.3)
SODIUM: 140 meq/L (ref 137–147)

## 2013-12-09 LAB — EXPECTORATED SPUTUM ASSESSMENT W REFEX TO RESP CULTURE: SPECIAL REQUESTS: NORMAL

## 2013-12-09 LAB — B. BURGDORFI ANTIBODIES: B burgdorferi Ab IgG+IgM: 0.4 {ISR}

## 2013-12-09 LAB — EXPECTORATED SPUTUM ASSESSMENT W GRAM STAIN, RFLX TO RESP C

## 2013-12-09 MED ORDER — INSULIN GLARGINE 100 UNIT/ML ~~LOC~~ SOLN
5.0000 [IU] | Freq: Every day | SUBCUTANEOUS | Status: DC
  Administered 2013-12-09 – 2013-12-12 (×4): 5 [IU] via SUBCUTANEOUS
  Filled 2013-12-09 (×5): qty 0.05

## 2013-12-09 NOTE — Progress Notes (Signed)
[facility] PROGRESS NOTE  Courtney Stevens [id number] DOB: Jan 22, 1956 DOA: 12/03/2013 PCP: [name], MD  Assessment/Plan: FUO with SIRS  -no definitive source identified  -appreciate ID recommendations  -venous duplex legs--r/o DVT  -Continue vancomycin, cefepime, metronidazole  -Imaging of the brain, chest, abdomen negative for infectious etiology  -PICC line 12/08/13 -All cultures negative to date  -pathology from liver biopsy= granuloma, stains negative -discussed with Dr. Willette Brace liver biopsy with fungal and AFB stains -fungal, brucella, coxiella serologies sent; QuantiFERON-TB -agree with d/c antibacterials -case discussed with IR, Dr. 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 Birdie Sons at [facility] center on 02/22/2008.  -radical resection for node-positive adenocarcinoma of the bile duct with positive lymph node involvement (2 out of 17 lymph nodes) with positive surgical margins.  -appreciated Dr. Juliann Mule followup  -Await pathology from liver biopsy-12/04/13  DM2  -12/07/2013 hemoglobin A1c 7.9  -Continue Humalog sliding scale  -Add NovoLog 4 units before each meal -start Lantus 5 units  Pancreatic insufficiency  -Continue Creon  -Patient is eating well  Hypothyroidism  -Continue Synthroid  Anemia of chronic disease  -Hemoglobin is stable  Family Communication:   Pt at beside Disposition Plan:   Home when medically stable       Procedures/Studies: Dg Chest 2 View  12/03/2013   CLINICAL DATA:  Fever, cough.  EXAM: CHEST  2 VIEW  COMPARISON:  November 25, 2013.  FINDINGS: The heart size and mediastinal contours are within normal limits. Both lungs are clear. No pleural effusion or pneumothorax is noted. The visualized skeletal structures are unremarkable.  IMPRESSION: No active  cardiopulmonary disease.   Electronically Signed   By: [name] M.D.   On: 12/03/2013 14:14   Dg Chest 2 View  11/25/2013   CLINICAL DATA:  Mental status change, fever and chills, history of biliary tract cancer  EXAM: CHEST  2 VIEW  COMPARISON:  CT scan of this thorax dated November 19, 2013 and chest x-ray of the same day.  FINDINGS: The lungs are adequately inflated. There is no focal infiltrate. There is mild symmetric prominence of the pulmonary interstitial markings bilaterally. There is no pleural effusion. The cardiopericardial silhouette is normal in size. The pulmonary vascularity is not engorged. The mediastinum is normal in width very there is no pleural effusion. The observed portions of the bony thorax exhibit no acute abnormalities. There are surgical clips in the left upper quadrant of the abdomen.  IMPRESSION: There is no evidence of pneumonia nor CHF nor other acute cardiopulmonary abnormality. Mild prominence of the pulmonary interstitial markings bilaterally may be related to the patient's previous smoking history.   Electronically Signed   By: Issam Carlyon  [place]   On: 11/25/2013 16:18   Dg Chest 2 View  11/19/2013   CLINICAL DATA:  Shortness of breath and headache.  EXAM: CHEST  2 VIEW  COMPARISON:  03/02/2010  FINDINGS: Lungs are adequately inflated without consolidation or effusion. Cardiomediastinal silhouette is within normal. There is mild spondylosis of the spine. Multiple surgical clips over the abdomen.  IMPRESSION: No active cardiopulmonary disease.   Electronically Signed   By: Marin Olp M.D.   On: 11/19/2013 13:25   Ct Head Wo Contrast  11/19/2013   CLINICAL DATA:  Headache, no known injury; history of adenocarcinoma of the bile duct  EXAM: CT HEAD WITHOUT CONTRAST  TECHNIQUE: Contiguous axial images were obtained  from the base of the skull through the vertex without intravenous contrast.  COMPARISON:  PET-CT -06/12/2009  FINDINGS: Gray-white differentiation is maintained.  Incidental note is made of prominent VR spaces within the caudal medial aspects of the bilateral temporal lobes. No CT evidence for acute large territory infarct. No intraparenchymal or extra-axial mass or hemorrhage. Normal size and configuration of the ventricles and basilar cisterns. No midline. 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 The remaining paranasal sinuses mastoid air cells appear normally aerated. Regional soft tissues appear normal. No displaced calvarial fracture.  IMPRESSION: Negative noncontrast head CT.   Electronically Signed   By: [name] M.D.   On: 11/19/2013 12:58   Ct Angio Chest Pe W/cm &/or Wo Cm  11/19/2013   CLINICAL DATA:  Shortness of breath, chest pain.  EXAM: CT ANGIOGRAPHY CHEST WITH CONTRAST  TECHNIQUE: Multidetector CT imaging of the chest was performed using the standard protocol during bolus administration of intravenous contrast. Multiplanar CT image reconstructions including MIPs were obtained to evaluate the vascular anatomy.  CONTRAST:  160mL OMNIPAQUE IOHEXOL 350 MG/ML SOLN  COMPARISON:  CT scan of Apr 14, 2013.  FINDINGS: No pleural effusion or pneumothorax is noted. No significant pulmonary parenchymal abnormality is noted. The thoracic aorta appears normal without evidence of dissection or aneurysm. Pulmonary arteries appear normal. There is no evidence of pulmonary embolus no significant mediastinal adenopathy is noted. Ill-defined low-density measuring 4 cm is seen in the posterior segment of the right hepatic lobe ; further evaluation with MRI is recommended. No osseous abnormality is noted in the chest  Review of the MIP images confirms the above findings.  IMPRESSION: No evidence of pulmonary embolus. No acute pulmonary disease is noted. 4 cm ill-defined low density is noted in the posterior segment of the right hepatic lobe concerning for possible neoplasm ; further  evaluation with MRI is recommended.   Electronically Signed   By: [name] M.D.   On: 11/19/2013 14:58   Mr Jeri Cos [id number] Contrast  12/06/2013   CLINICAL DATA:  Bile duct cancer post resection 2009. Fever. Headaches. Diabetic hypertensive hyperlipidemia patient.  EXAM: MRI HEAD WITHOUT AND WITH CONTRAST  TECHNIQUE: Multiplanar, multiecho pulse sequences of the brain and surrounding structures were obtained without and with intravenous contrast.  CONTRAST:  14 cc MultiHance.  COMPARISON:  11/19/2013 CT.  No comparison brain MR.  FINDINGS: No acute infarct.  No intracranial hemorrhage.  Minimal nonspecific white matter type changes without MR evidence of intracranial infection. If this is of high clinical concern, correlation with cerebral spinal fluid analysis may then be considered.  Prominent peri vascular spaces versus remote basal ganglia lacune infarcts.  No intracranial mass or abnormal enhancement.  Major intracranial vascular structures are patent.  Cervical medullary junction, pituitary region, pineal region and orbital structures unremarkable.  Minimal mucosal thickening inferior aspect right maxillary sinus.  IMPRESSION: No acute infarct.  Minimal nonspecific white matter type changes without MR evidence of intracranial infection. If this is of high clinical concern, correlation with cerebral spinal fluid analysis may then be considered.  Prominent peri vascular spaces versus remote basal ganglia lacune infarcts.  No intracranial mass or abnormal enhancement.   Electronically Signed   By: Chauncey Cruel M.D.   On: 12/06/2013 17:08   Mr Lumbar Spine W Wo Contrast  12/06/2013   CLINICAL DATA:  Bile duct cancer. Fever. Back pain. Query abscess/  infection.  EXAM: MRI LUMBAR SPINE WITHOUT AND WITH CONTRAST  TECHNIQUE: Multiplanar and multiecho pulse sequences of the lumbar spine were obtained without and with intravenous contrast.  CONTRAST:  24mL MULTIHANCE GADOBENATE DIMEGLUMINE 529 MG/ML IV SOLN   COMPARISON:  MR ABDOMEN WO/W CM dated 11/25/2013; CT ABD/PELVIS W CM dated 12/03/2013  FINDINGS: The lowest lumbar type non-rib-bearing vertebra is labeled as L5. The conus medullaris appears normal. Conus level: L1-2.  No vertebral subluxation is observed. Intervertebral disc desiccation is observed at L4-5. No significant abnormal lumbar vertebral edema or vertebral enhancement identified.  Right kidney upper pole atrophy/scarring noted. No findings of discitis -osteomyelitis. No significant abnormal epidural process. No clumping of nerve roots to suggest arachnoiditis.  Despite efforts by the technologist and patient, motion artifact is present on today's exam and could not be eliminated. This reduces exam sensitivity and specificity. Additional findings at individual levels are as follows:  L1-2:  Unremarkable.  L2-3:  No impingement.  Left facet arthropathy.  L3-4:  Unremarkable.  L4-5: No impingement. Minimal disc bulge with right foraminal annular tear.  L5-S1:  No impingement.  Right greater than left facet arthropathy.  IMPRESSION: 1. No lumbar impingement or findings suspicious for infection or malignancy involving the lumbar spine. 2. Mild lumbar spondylosis and degenerative disc disease.   Electronically Signed   By: Sherryl Barters M.D.   On: 12/06/2013 16:55   Mr Abdomen W Wo Contrast  11/25/2013   CLINICAL DATA:  Abdominal pain with nausea, vomiting and fever. 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 Liver lesion on CT.  EXAM: MRI ABDOMEN WITHOUT AND WITH CONTRAST  TECHNIQUE: Multiplanar multisequence MR imaging of the abdomen was performed both before and after the administration of intravenous contrast.  CONTRAST:  37mL MULTIHANCE GADOBENATE DIMEGLUMINE 529 MG/ML IV SOLN  COMPARISON:  CT ABD/PELVIS W CM dated 02/19/2013; CT ABD/PELVIS W CM dated 08/14/2012; MR ABDOMEN WO/W CM dated 03/06/2010; CT ANGIO CHEST W/CM &/OR WO/CM dated 11/19/2013  FINDINGS: The previously demonstrated  diffuse severe hepatic steatosis has improved. Medially within the right hepatic lobe at the site of the previous geographic sparing of steatosis is a new heterogeneous mass. This demonstrates peripheral heterogeneous enhancement following contrast, measuring approximately 5.6 x 3.7 cm transverse on image 23 of series 1201. There is inferior extension of tumor along the posteromedial hepatic capsule. There is extrinsic mass effect on the IVC which is narrowed, but patent. The hepatic and portal veins are patent. The dominant liver lesion demonstrates central cystic or necrotic components which do not enhance.  No other focal liver lesions are identified. There is stable mild intrahepatic biliary dilatation status post choledochojejunostomy. There is stable atrophy of the pancreatic body and tail. Soft tissue stranding and enhancement surrounding the duodenum and IVC are similar to the prior examination, likely postsurgical in etiology.  The spleen, adrenal glands and kidneys demonstrate no significant findings. There is no hydronephrosis. There is upper abdominal adenopathy within the portacaval space and retroperitoneum. There is a 1.4 cm aortic caval node which demonstrates central necrosis. There is a new enhancing 11 mm nodule inferior to the right hepatic lobe which may reflect a small peritoneal implant. No significant ascites is seen.  IMPRESSION: 1. As demonstrated on recent Chest CT, there is a new peripherally enhancing mass posteromedially within the right hepatic lobe, highly worrisome for local recurrence or metastatic disease from the patient's bile duct adenocarcinoma. A liver abscess could have this appearance, although that possibility is considered much  less likely. 2. Associated necrotic upper abdominal adenopathy, also worrisome for metastatic disease. Possible small peritoneal implant adjacent to the right hepatic lobe. 3. No significant biliary dilatation. 4. Extrinsic mass effect on the IVC  without evidence of occlusion.   Electronically Signed   By: Camie Patience M.D.   On: 11/25/2013 18:37   Ct Abdomen Pelvis W Contrast  12/03/2013   CLINICAL DATA:  History of Whipple procedure for bile duct carcinoma. New sepsis. New liver lesion  EXAM: CT ABDOMEN AND PELVIS WITH CONTRAST  TECHNIQUE: Multidetector CT imaging of the abdomen and pelvis was performed using the standard protocol following bolus administration of intravenous contrast.  CONTRAST:  171mL OMNIPAQUE IOHEXOL 300 MG/ML  SOLN  COMPARISON:  MR ABDOMEN WO/W CM dated 11/25/2013  FINDINGS: Lung bases are clear.  No pericardial fluid.  Again demonstrated a large subcapsular lesion within the posterior right hepatic lobe measuring 4.5 x 4.5 cm similar to 4.8 x 4.3 cm on comparison MRI. This lesion continues demonstrate mild peripheral enhancement and central heterogeneity. No additional hepatic lesions. There is pneumobilia in the left hepatic lobe consistent with choledochojejunostomy. There is some edema involving the bowel along the porta hepatis and small amount of fluid small of fluid along Morrison's pouch is similar prior.  Small nodule inferior right hepatic lobe measuring 8 mm unchanged (image 31, series 2 ). The body and tail of the pancreas normal. The spleen, adrenal glands, kidneys are normal. There is some cortical thinning on the right.  Small bowel and colon are unremarkable. There is no evidence of abscess in the pelvis.  Post hysterectomy anatomy. The bladder is normal. No pelvic lymphadenopathy. No aggressive osseous lesion.  IMPRESSION: 1. No change in large enhancing lesion the posterior aspect of the right hepatic lobe. In patient with sepsis of unknown origin would consider hepatic abscess as a distinct possibility although adenocarcinoma recurrence cannot be excluded. Recommend ultrasound guided percutaneous biopsy of this lesion to differentiate between abscess and malignancy. Favor abscess. 2. Edema along the proximal bowel  and Morrison's pouch likely related to the hepatic process. 3. Patient status post a Whipple procedure without complicating features. This was made a call report.   Electronically Signed   By: Suzy Bouchard M.D.   On: 12/03/2013 20:18   US Biopsy  12/04/2013   CLINICAL DATA:  58 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 Patient has an elevated white count and chills. Evaluate for recurrent cancer versus abscess.  EXAM: ULTRASOUND GUIDED BIOPSY OF THE LIVER LESION  MEDICATIONS: 2.0 mg IV Versed; 100 mcg IV Fentanyl. A radiology nurse monitored the patient throughout the procedure.  Total Moderate Sedation Time: 25 min  PROCEDURE: The procedure, risks, benefits, and alternatives were explained to the patient. Questions regarding the procedure were encouraged and answered. The patient understands and consents to the procedure.  The right upper abdomen was prepped with chlorhexidine. A sterile drape was placed. The skin and liver capsule were anesthetized with 1% lidocaine. A 17 gauge needle was directed into the posterior right hepatic lesion and tried to aspirate fluid from the 17 gauge needle. No purulent fluid was obtained. A small amount of bloody fluid was collected. Three core biopsies were obtained with an 18 gauge device. Specimens were placed in formalin. Needle removed without complication.  COMPLICATIONS: None.  FINDINGS: Heterogeneous hypoechoic lesion in the posterior right hepatic lobe. Needle placement was confirmed within the lesion. Following the first core biopsy, there was development of multiple  echogenic foci throughout the lesion. Echogenic foci probably represent gas from the biliary system. No significant bleeding.  IMPRESSION: Ultrasound-guided core biopsies of the liver lesion. Solid core material was obtained from the biopsies and no purulent fluid could be obtained. Findings are suspicious for recurrent cancer rather than abscess. A sample of the aspirated blood  was sent for Gram stain and culture.   Electronically Signed   By: Markus Daft M.D.   On: 12/04/2013 16:36         Subjective: Patient denies  chest discomfort, shortness breath, nausea,  vomiting, abdominal pain, dysuria, hematuria. She complains of fevers and chills. No headache or visual disturbance.  Objective: Filed Vitals:   12/09/13 0540 12/09/13 1040 12/09/13 1200 12/09/13 1447  BP: 131/72   104/66  Pulse: 64   101  Temp: 98.1 F (36.7 C) 99.7 F (37.6 C) 102.8 F (39.3 C) 99.3 F (37.4 C)  TempSrc: Oral Oral Oral Oral  Resp: 20   20  Height:      Weight:      SpO2: 98%   96%    Intake/Output Summary (Last 24 hours) at 12/09/13 1902 Last data filed at 12/09/13 1500  Gross per 24 hour  Intake   1900 ml  Output      0 ml  Net   1900 ml   Weight change:  Exam:   General:  Pt is alert, follows commands appropriately, not in acute distress  HEENT: No icterus, No thrush,  [place]/AT  Cardiovascular: RRR, S1/S2, no rubs, no gallops  Respiratory: CTA bilaterally, no wheezing, no crackles, no rhonchi  Abdomen: Soft/+BS, non tender, non distended, no guarding  Extremities: trace LE edema, No lymphangitis, No petechiae, No rashes, no synovitis  Data Reviewed: Basic Metabolic Panel:  Recent Labs Lab 12/05/13 0540 12/06/13 0518 12/07/13 0440 12/08/13 0640 12/09/13 0540  NA 136* 139 140 135* 140  K 3.8 3.6* 4.4 4.0 3.6*  CL 97 99 101 100 104  CO2 25 28 28 25 27   GLUCOSE 155* 183* 166* 197* 151*  BUN 10 12 10 7 7   CREATININE 1.07 0.90 0.90 0.85 0.92  CALCIUM 8.6 8.4 9.0 8.3* 8.3*   Liver Function Tests:  Recent Labs Lab 12/04/13 0510 12/05/13 0540  AST 14 19  ALT 13 15  ALKPHOS 131* 123*  BILITOT 0.2* 0.3  PROT 5.7* 6.2  ALBUMIN 2.2* 2.4*   No results found for this basename: LIPASE, AMYLASE,  in the last 168 hours No results found for this basename: AMMONIA,  in the last 168 hours CBC:  Recent Labs Lab 12/03/13 1315  12/05/13 0540  12/06/13 0518 12/07/13 0440 12/08/13 0640 12/09/13 0540  WBC 16.5*  < > 13.7* 6.5 5.9 11.4* 6.4  NEUTROABS 15.7*  --  12.0*  --   --   --   --   HGB 10.2*  < > 10.8* 10.4* 11.2* 10.7* 9.6*  HCT 31.5*  < > 33.7* 32.6* 34.7* 33.0* 31.0*  MCV 89.0  < > 88.9 88.8 87.8 87.5 89.9  PLT 261  < > 275 261 301 265 268  < > = values in this interval not displayed. Cardiac Enzymes: No results found for this basename: CKTOTAL, CKMB, CKMBINDEX, TROPONINI,  in the last 168 hours BNP: No components found with this basename: POCBNP,  CBG:  Recent Labs Lab 12/08/13 1727 12/08/13 2137 12/09/13 0748 12/09/13 1150 12/09/13 1647  GLUCAP 96 195* 147* 221* 256*    Recent Results (from the  past 240 hour(s))  CULTURE, BLOOD (ROUTINE X 2)     Status: None   Collection Time    12/03/13  1:15 PM      Result Value Range Status   Specimen Description BLOOD RIGHT FOREARM  5 ML IN [facility] BOTTLE   Final   Special Requests NONE   Final   Culture  Setup Time     Final   Value: 12/03/2013 19:43     Performed at [facility]   Culture     Final   Value: NO GROWTH 5 DAYS     Performed at [facility]   Report Status 12/09/2013 FINAL   Final  CULTURE, BLOOD (ROUTINE X 2)     Status: None   Collection Time    12/03/13  2:09 PM      Result Value Range Status   Specimen Description BLOOD RIGHT FOREARM  5 ML IN [facility] BOTTLE   Final   Special Requests NONE   Final   Culture  Setup Time     Final   Value: 12/03/2013 19:43     Performed at [facility]   Culture     Final   Value: NO GROWTH 5 DAYS     Performed at [facility]   Report Status 12/09/2013 FINAL   Final  CULTURE, ROUTINE-ABSCESS     Status: None   Collection Time    12/04/13  4:19 PM      Result Value Range Status   Specimen Description LIVER   Final   Special Requests NONE   Final   Gram Stain     Final   Value: NO WBC SEEN     NO SQUAMOUS EPITHELIAL CELLS SEEN     NO ORGANISMS SEEN     Performed at  [facility]   Culture     Final   Value: NO GROWTH 2 DAYS     Performed at [facility]   Report Status 12/08/2013 FINAL   Final  CULTURE, EXPECTORATED SPUTUM-ASSESSMENT     Status: None   Collection Time    12/08/13 12:28 PM      Result Value Range Status   Specimen Description SPUTUM   Final   Special Requests NONE   Final   Sputum evaluation     Final   Value: MICROSCOPIC FINDINGS SUGGEST THAT THIS SPECIMEN IS NOT REPRESENTATIVE OF LOWER RESPIRATORY SECRETIONS. PLEASE RECOLLECT.     INFORMED DANA RN AT [id number] ON 01.21.15 BY SHUEA   Report Status 12/08/2013 FINAL   Final  CULTURE, EXPECTORATED SPUTUM-ASSESSMENT     Status: None   Collection Time    12/09/13  7:16 AM      Result Value Range Status   Specimen Description SPUTUM   Final   Special Requests Normal   Final   Sputum evaluation     Final   Value: MICROSCOPIC FINDINGS SUGGEST THAT THIS SPECIMEN IS NOT REPRESENTATIVE OF LOWER RESPIRATORY SECRETIONS. PLEASE RECOLLECT.     INFORMED DANDA DARKE RN AT [id number] ON 01.22.15 BY SHUEA   Report Status 12/09/2013 FINAL   Final     Scheduled Meds: . calcium-vitamin D  1 tablet Oral TID WC  . enoxaparin (LOVENOX) injection  40 mg Subcutaneous Q2200  . furosemide  40 mg Oral BID  . insulin aspart  0-15 Units Subcutaneous TID WC  . insulin aspart  4 Units Subcutaneous TID WC  . iron polysaccharides  150 mg  Oral Daily  . levothyroxine  100 mcg Oral Q48H  . levothyroxine  200 mcg Oral Q48H  . lipase/protease/amylase  1 capsule Oral TID AC  . loratadine  10 mg Oral Daily  . magnesium oxide  200 mg Oral TID PC & HS  . multivitamin with minerals  1 tablet Oral Daily  . pantoprazole  40 mg Oral Daily  . potassium chloride SA  20 mEq Oral BID  . sodium chloride  3 mL Intravenous Q12H  . Vitamin D (Ergocalciferol)  50,000 Units Oral QPM   Continuous Infusions: . sodium chloride 100 mL/hr at 12/09/13 1500     Shailyn Weyandt, DO  [facility] Pager [phone]  If  7PM-7AM, please contact night-coverage [email or web address] Password [email or web address] 12/09/2013, 7:02 PM   LOS: 6 days

## 2013-12-09 NOTE — Telephone Encounter (Signed)
s.w. pt and she advised she is still in hospital and needed to cx appt and Dr. Juliann Mule advised her that he would r/s at later d.t

## 2013-12-09 NOTE — H&P (Signed)
Courtney Stevens is an 58 y.o. female.   Chief Complaint: scheduled for repeat liver bx/aspiration to send for culture Initial bx 1/17 revealed granuloma Pt still with fevers; sl abd pain Request from ID to repeat and send for Cx  HPI: Bile duct Ca; DM; HTN; HLD; asthma  Past Medical History  Diagnosis Date  . Cancer     bile duct ca  . GERD (gastroesophageal reflux disease)   . Diabetes mellitus   . Hypertension   . Hypothyroid   . Hyperlipidemia   . Asthma     Past Surgical History  Procedure Laterality Date  . Gastric bypass  2004  . Cholecystectomy  1992  . Bile duct resection  2009  . Appendectomy    . Tonsillectomy    . Abdominal hysterectomy  1990    BIL OOPHORECTOMY  . Tubal ligation    . Breast surgery      BIOPSY NEG    Family History  Problem Relation Age of Onset  . Colon cancer Mother   . Hypertension Mother   . Glaucoma Mother   . Diabetes Father   . Alzheimer's disease Father   . Heart attack Father   . Nephrolithiasis Father   . Emphysema Father     was a smoker  . Heart disease Father   . Nephrolithiasis Sister   . Heart attack Brother   . Nephrolithiasis Brother    Social History:  reports that she quit smoking about 36 years ago. Her smoking use included Cigarettes. She has a 10 pack-year smoking history. She has never used smokeless tobacco. She reports that she drinks alcohol. She reports that she does not use illicit drugs.  Allergies:  Allergies  Allergen Reactions  . Ace Inhibitors Anaphylaxis  . Tilade [Nedocromil] Anaphylaxis  . Reglan [Metoclopramide] Nausea And Vomiting  . Betadine [Povidone Iodine] Rash    iching     Medications Prior to Admission  Medication Sig Dispense Refill  . ALPRAZolam (XANAX) 1 MG tablet Take 1 tablet (1 mg total) by mouth at bedtime as needed. anxiety  30 tablet  0  . calcium citrate-vitamin D (CITRACAL+D) 315-200 MG-UNIT per tablet Take 1 tablet by mouth 3 (three) times daily.      . cetirizine  (ZYRTEC) 10 MG tablet Take 10 mg by mouth every morning.        . diphenhydrAMINE (BENADRYL) 25 MG tablet Take 25 mg by mouth every 6 (six) hours as needed.      . furosemide (LASIX) 40 MG tablet Take 40 mg by mouth 2 (two) times daily.      Marland Kitchen glyBURIDE (DIABETA) 5 MG tablet Take 5 mg by mouth daily.       Marland Kitchen HYDROcodone-acetaminophen (NORCO/VICODIN) 5-325 MG per tablet Take 1 tablet by mouth every 6 (six) hours as needed for moderate pain.  20 tablet  0  . Iron Combinations (IRON COMPLEX PO) Take 1 tablet by mouth daily. Pt takes 27m iron       . levofloxacin (LEVAQUIN) 500 MG tablet Take 1 tablet (500 mg total) by mouth daily.  3 tablet  0  . levofloxacin (LEVAQUIN) 500 MG tablet Take 500 mg by mouth daily.      .Marland Kitchenlevothyroxine (SYNTHROID, LEVOTHROID) 200 MCG tablet Take 100-200 mcg by mouth daily. Pt alternates between 1/2 to 1 tablet daily      . lipase/protease/amylase (CREON-10/PANCREASE) 12000 UNITS CPEP Take 1 capsule by mouth 3 (three) times daily before meals.        .Marland Kitchen  Magnesium 250 MG TABS Take 1 tablet by mouth 4 (four) times daily.        . metFORMIN (GLUCOPHAGE) 500 MG tablet Take 500 mg by mouth 3 (three) times daily with meals. One tab at breakfast, one tab at lunch and two tabs for dinner      . Multiple Vitamins-Minerals (MULTIVITAMIN WITH MINERALS) tablet Take 1 tablet by mouth as needed.       Marland Kitchen omeprazole (PRILOSEC) 40 MG capsule Take 40 mg by mouth 2 (two) times daily.      Marland Kitchen OVER THE COUNTER MEDICATION Take 1 tablet by mouth 4 (four) times daily. Calcium,magnesium,zinc supplement       . potassium chloride SA (K-DUR,KLOR-CON) 20 MEQ tablet Take 20 mEq by mouth 2 (two) times daily.       . Vitamin D, Ergocalciferol, (DRISDOL) 50000 UNITS CAPS Take 50,000 Units by mouth every evening.         Results for orders placed during the hospital encounter of 12/03/13 (from the past 48 hour(s))  GLUCOSE, CAPILLARY     Status: Abnormal   Collection Time    12/07/13  4:55 PM       Result Value Range   Glucose-Capillary 167 (*) 70 - 99 mg/dL  GLUCOSE, CAPILLARY     Status: Abnormal   Collection Time    12/07/13  9:50 PM      Result Value Range   Glucose-Capillary 286 (*) 70 - 99 mg/dL  CBC     Status: Abnormal   Collection Time    12/08/13  6:40 AM      Result Value Range   WBC 11.4 (*) 4.0 - 10.5 K/uL   RBC 3.77 (*) 3.87 - 5.11 MIL/uL   Hemoglobin 10.7 (*) 12.0 - 15.0 g/dL   HCT 33.0 (*) 36.0 - 46.0 %   MCV 87.5  78.0 - 100.0 fL   MCH 28.4  26.0 - 34.0 pg   MCHC 32.4  30.0 - 36.0 g/dL   RDW 14.7  11.5 - 15.5 %   Platelets 265  150 - 400 K/uL  VANCOMYCIN, TROUGH     Status: None   Collection Time    12/08/13  6:40 AM      Result Value Range   Vancomycin Tr 17.9  10.0 - 20.0 ug/mL  BASIC METABOLIC PANEL     Status: Abnormal   Collection Time    12/08/13  6:40 AM      Result Value Range   Sodium 135 (*) 137 - 147 mEq/L   Potassium 4.0  3.7 - 5.3 mEq/L   Chloride 100  96 - 112 mEq/L   CO2 25  19 - 32 mEq/L   Glucose, Bld 197 (*) 70 - 99 mg/dL   BUN 7  6 - 23 mg/dL   Creatinine, Ser 0.85  0.50 - 1.10 mg/dL   Calcium 8.3 (*) 8.4 - 10.5 mg/dL   GFR calc non Af Amer 74 (*) >90 mL/min   GFR calc Af Amer 86 (*) >90 mL/min   Comment: (NOTE)     The eGFR has been calculated using the CKD EPI equation.     This calculation has not been validated in all clinical situations.     eGFR's persistently <90 mL/min signify possible Chronic Kidney     Disease.  GLUCOSE, CAPILLARY     Status: Abnormal   Collection Time    12/08/13  7:36 AM      Result Value Range  Glucose-Capillary 181 (*) 70 - 99 mg/dL  GLUCOSE, CAPILLARY     Status: Abnormal   Collection Time    12/08/13 11:53 AM      Result Value Range   Glucose-Capillary 398 (*) 70 - 99 mg/dL  CULTURE, EXPECTORATED SPUTUM-ASSESSMENT     Status: None   Collection Time    12/08/13 12:28 PM      Result Value Range   Specimen Description SPUTUM     Special Requests NONE     Sputum evaluation       Value:  MICROSCOPIC FINDINGS SUGGEST THAT THIS SPECIMEN IS NOT REPRESENTATIVE OF LOWER RESPIRATORY SECRETIONS. PLEASE RECOLLECT.     INFORMED DANA RN AT [id number] ON 01.21.15 BY SHUEA   Report Status 12/08/2013 FINAL    GLUCOSE, CAPILLARY     Status: None   Collection Time    12/08/13  5:27 PM      Result Value Range   Glucose-Capillary 96  70 - 99 mg/dL  B. BURGDORFI ANTIBODIES     Status: None   Collection Time    12/08/13  6:35 PM      Result Value Range   B burgdorferi Ab IgG+IgM 0.40     Comment: (NOTE)     Antibody to Borrelia burgdorferi not detected.       ISR = Immune Status Ratio                 <0.90         ISR       Negative                 0.90 - 1.09   ISR       Equivocal                 >=1.10        ISR       Positive     Performed at [place], CAPILLARY     Status: Abnormal   Collection Time    12/08/13  9:37 PM      Result Value Range   Glucose-Capillary 195 (*) 70 - 99 mg/dL  BASIC METABOLIC PANEL     Status: Abnormal   Collection Time    12/09/13  5:40 AM      Result Value Range   Sodium 140  137 - 147 mEq/L   Potassium 3.6 (*) 3.7 - 5.3 mEq/L   Chloride 104  96 - 112 mEq/L   CO2 27  19 - 32 mEq/L   Glucose, Bld 151 (*) 70 - 99 mg/dL   BUN 7  6 - 23 mg/dL   Creatinine, Ser 0.92  0.50 - 1.10 mg/dL   Calcium 8.3 (*) 8.4 - 10.5 mg/dL   GFR calc non Af Amer 67 (*) >90 mL/min   GFR calc Af Amer 78 (*) >90 mL/min   Comment: (NOTE)     The eGFR has been calculated using the CKD EPI equation.     This calculation has not been validated in all clinical situations.     eGFR's persistently <90 mL/min signify possible Chronic Kidney     Disease.  CBC     Status: Abnormal   Collection Time    12/09/13  5:40 AM      Result Value Range   WBC 6.4  4.0 - 10.5 K/uL   RBC 3.45 (*) 3.87 - 5.11 MIL/uL   Hemoglobin 9.6 (*) 12.0 - 15.0 g/dL  HCT 31.0 (*) 36.0 - 46.0 %   MCV 89.9  78.0 - 100.0 fL   MCH 27.8  26.0 - 34.0 pg   MCHC 31.0  30.0 - 36.0 g/dL    RDW 15.0  11.5 - 15.5 %   Platelets 268  150 - 400 K/uL  CULTURE, EXPECTORATED SPUTUM-ASSESSMENT     Status: None   Collection Time    12/09/13  7:16 AM      Result Value Range   Specimen Description SPUTUM     Special Requests Normal     Sputum evaluation       Value: MICROSCOPIC FINDINGS SUGGEST THAT THIS SPECIMEN IS NOT REPRESENTATIVE OF LOWER RESPIRATORY SECRETIONS. PLEASE RECOLLECT.     INFORMED DANDA DARKE RN AT 1740 ON 01.22.15 BY SHUEA   Report Status 12/09/2013 FINAL    GLUCOSE, CAPILLARY     Status: Abnormal   Collection Time    12/09/13  7:48 AM      Result Value Range   Glucose-Capillary 147 (*) 70 - 99 mg/dL  GLUCOSE, CAPILLARY     Status: Abnormal   Collection Time    12/09/13 11:50 AM      Result Value Range   Glucose-Capillary 221 (*) 70 - 99 mg/dL   No results found.  Review of Systems  Constitutional: Positive for fever and weight loss.  Respiratory: Negative for shortness of breath.   Cardiovascular: Negative for chest pain.  Gastrointestinal: Positive for abdominal pain. Negative for nausea and vomiting.  Neurological: Positive for weakness.  Psychiatric/Behavioral: Negative for substance abuse.    Blood pressure 104/66, pulse 101, temperature 99.3 F (37.4 C), temperature source Oral, resp. rate 20, height 5' 7" (1.702 m), weight 156 lb (70.761 kg), SpO2 96.00%. Physical Exam  Constitutional: She is oriented to person, place, and time. She appears well-nourished.  Cardiovascular: Normal rate, regular rhythm and normal heart sounds.   No murmur heard. Respiratory: Effort normal and breath sounds normal. She has no wheezes.  GI: Soft. Bowel sounds are normal. There is tenderness.  Musculoskeletal: Normal range of motion.  Neurological: She is alert and oriented to person, place, and time.  Skin: Skin is warm and dry.  Psychiatric: She has a normal mood and affect. Her behavior is normal. Judgment and thought content normal.     Assessment/Plan Liver  lesion Bx 1/17: granuloma Pt still with fever; weakness; abd pain Scheduled now for repeat aspiration to be sent for cx Pt aware of procedure benefits and risks and agreeable to proceed Consent signed and in chart Lovenox held   [facility] A 12/09/2013, 3:45 PM

## 2013-12-09 NOTE — Progress Notes (Signed)
[facility] for Infectious Disease  Date of Admission:  12/03/2013  Antibiotics: Antibiotics Given (last 72 hours)   Date/Time Action Medication Dose Rate   12/06/13 1359 Given   metroNIDAZOLE (FLAGYL) tablet 500 mg 500 mg    12/06/13 1716 Given   ceFEPIme (MAXIPIME) 2 g in dextrose 5 % 50 mL IVPB 2 g 100 mL/hr   12/06/13 2002 Given   vancomycin (VANCOCIN) IVPB 1000 mg/200 mL premix 1,000 mg 200 mL/hr   12/06/13 2153 Given   metroNIDAZOLE (FLAGYL) tablet 500 mg 500 mg    12/07/13 0527 Given   metroNIDAZOLE (FLAGYL) tablet 500 mg 500 mg    12/07/13 0836 Given   vancomycin (VANCOCIN) IVPB 1000 mg/200 mL premix 1,000 mg 200 mL/hr   12/07/13 1007 Given   ceFEPIme (MAXIPIME) 2 g in dextrose 5 % 50 mL IVPB 2 g 100 mL/hr   12/07/13 1332 Given   metroNIDAZOLE (FLAGYL) tablet 500 mg 500 mg    12/07/13 1946 Given   vancomycin (VANCOCIN) IVPB 1000 mg/200 mL premix 1,000 mg 200 mL/hr   12/07/13 2132 Given   ceFEPIme (MAXIPIME) 2 g in dextrose 5 % 50 mL IVPB 2 g 100 mL/hr   12/07/13 2134 Given   metroNIDAZOLE (FLAGYL) tablet 500 mg 500 mg    12/08/13 0556 Given   metroNIDAZOLE (FLAGYL) tablet 500 mg 500 mg    12/08/13 0848 Given   vancomycin (VANCOCIN) IVPB 1000 mg/200 mL premix 1,000 mg 200 mL/hr   12/08/13 1113 Given   ceFEPIme (MAXIPIME) 2 g in dextrose 5 % 50 mL IVPB 2 g 100 mL/hr   12/08/13 1526 Given   metroNIDAZOLE (FLAGYL) tablet 500 mg 500 mg    12/08/13 1959 Given   vancomycin (VANCOCIN) IVPB 1000 mg/200 mL premix 1,000 mg 200 mL/hr   12/08/13 2206 Given   ceFEPIme (MAXIPIME) 2 g in dextrose 5 % 50 mL IVPB 2 g 100 mL/hr   12/08/13 2208 Given   metroNIDAZOLE (FLAGYL) tablet 500 mg 500 mg    12/09/13 2297 Given   metroNIDAZOLE (FLAGYL) tablet 500 mg 500 mg    12/09/13 9892 Given   vancomycin (VANCOCIN) IVPB 1000 mg/200 mL premix 1,000 mg 200 mL/hr   12/09/13 1040 Given   ceFEPIme (MAXIPIME) 2 g in dextrose 5 % 50 mL IVPB 2 g 100 mL/hr      Subjective: Fever  again yesterday and today, night sweats  Objective: Temp:  [98.1 F (36.7 C)-102.8 F (39.3 C)] 102.8 F (39.3 C) (01/22 1200) Pulse Rate:  [60-71] 64 (01/22 0540) Resp:  [19-20] 20 (01/22 0540) BP: (110-131)/(67-84) 131/72 mmHg (01/22 0540) SpO2:  [98 %-100 %] 98 % (01/22 0540)  General: awake, alert,  In chair Skin: no rashes Lungs: CTA B Cor: RRR Abdomen: soft, nt, nd Ext: no edema  Lab Results Lab Results  Component Value Date   WBC 6.4 12/09/2013   HGB 9.6* 12/09/2013   HCT 31.0* 12/09/2013   MCV 89.9 12/09/2013   PLT 268 12/09/2013    Lab Results  Component Value Date   CREATININE 0.92 12/09/2013   BUN 7 12/09/2013   NA 140 12/09/2013   K 3.6* 12/09/2013   CL 104 12/09/2013   CO2 27 12/09/2013    Lab Results  Component Value Date   ALT 15 12/05/2013   AST 19 12/05/2013   ALKPHOS 123* 12/05/2013   BILITOT 0.3 12/05/2013      Microbiology: Recent Results (from the past 240 hour(s))  CULTURE,  BLOOD (ROUTINE X 2)     Status: None   Collection Time    12/03/13  1:15 PM      Result Value Range Status   Specimen Description BLOOD RIGHT FOREARM  5 ML IN [facility] BOTTLE   Final   Special Requests NONE   Final   Culture  Setup Time     Final   Value: 12/03/2013 19:43     Performed at [facility]   Culture     Final   Value: NO GROWTH 5 DAYS     Performed at [facility]   Report Status 12/09/2013 FINAL   Final  CULTURE, BLOOD (ROUTINE X 2)     Status: None   Collection Time    12/03/13  2:09 PM      Result Value Range Status   Specimen Description BLOOD RIGHT FOREARM  5 ML IN [facility] BOTTLE   Final   Special Requests NONE   Final   Culture  Setup Time     Final   Value: 12/03/2013 19:43     Performed at [facility]   Culture     Final   Value: NO GROWTH 5 DAYS     Performed at [facility]   Report Status 12/09/2013 FINAL   Final  CULTURE, ROUTINE-ABSCESS     Status: None   Collection Time    12/04/13  4:19 PM      Result Value Range  Status   Specimen Description LIVER   Final   Special Requests NONE   Final   Gram Stain     Final   Value: NO WBC SEEN     NO SQUAMOUS EPITHELIAL CELLS SEEN     NO ORGANISMS SEEN     Performed at [facility]   Culture     Final   Value: NO GROWTH 2 DAYS     Performed at [facility]   Report Status 12/08/2013 FINAL   Final  CULTURE, EXPECTORATED SPUTUM-ASSESSMENT     Status: None   Collection Time    12/08/13 12:28 PM      Result Value Range Status   Specimen Description SPUTUM   Final   Special Requests NONE   Final   Sputum evaluation     Final   Value: MICROSCOPIC FINDINGS SUGGEST THAT THIS SPECIMEN IS NOT REPRESENTATIVE OF LOWER RESPIRATORY SECRETIONS. PLEASE RECOLLECT.     INFORMED DANA RN AT [id number] ON 01.21.15 BY SHUEA   Report Status 12/08/2013 FINAL   Final  CULTURE, EXPECTORATED SPUTUM-ASSESSMENT     Status: None   Collection Time    12/09/13  7:16 AM      Result Value Range Status   Specimen Description SPUTUM   Final   Special Requests Normal   Final   Sputum evaluation     Final   Value: MICROSCOPIC FINDINGS SUGGEST THAT THIS SPECIMEN IS NOT REPRESENTATIVE OF LOWER RESPIRATORY SECRETIONS. PLEASE RECOLLECT.     INFORMED DANDA [facility] RN AT [id number] ON 01.22.15 BY SHUEA   Report Status 12/09/2013 FINAL   Final    Studies/Results: No results found.  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  Stains all negative.  Appears more infectious than cancer.  Differential broad including fungal, AFB including tuberculosis, sarcoid and other autoimmune causes.  Continues to have fever and seems source related to this.   -Dr. Carles Collet to arrange IR  guided biopsy for culture -I will d/c antibiotics for now, restart vancomycin and cefepime if she becomes hemodynamically unstable  [name], [facility] for Infectious Disease [place] [email or web address] [phone] pager     [phone] cell 12/09/2013, 1:56 PM

## 2013-12-10 ENCOUNTER — Other Ambulatory Visit: Payer: BC Managed Care – PPO

## 2013-12-10 ENCOUNTER — Ambulatory Visit: Payer: BC Managed Care – PPO

## 2013-12-10 ENCOUNTER — Inpatient Hospital Stay (HOSPITAL_COMMUNITY): Payer: BC Managed Care – PPO

## 2013-12-10 ENCOUNTER — Inpatient Hospital Stay: Payer: BC Managed Care – PPO

## 2013-12-10 DIAGNOSIS — E109 Type 1 diabetes mellitus without complications: Secondary | ICD-10-CM

## 2013-12-10 LAB — URINALYSIS W MICROSCOPIC + REFLEX CULTURE
BILIRUBIN URINE: NEGATIVE
Glucose, UA: >1000 mg/dL — AB
Hgb urine dipstick: NEGATIVE
Ketones, ur: NEGATIVE mg/dL
LEUKOCYTES UA: NEGATIVE
NITRITE: NEGATIVE
Protein, ur: NEGATIVE mg/dL
SPECIFIC GRAVITY, URINE: 1.013 (ref 1.005–1.030)
UROBILINOGEN UA: 0.2 mg/dL (ref 0.0–1.0)
pH: 6.5 (ref 5.0–8.0)

## 2013-12-10 LAB — GLUCOSE, CAPILLARY
GLUCOSE-CAPILLARY: 161 mg/dL — AB (ref 70–99)
Glucose-Capillary: 163 mg/dL — ABNORMAL HIGH (ref 70–99)
Glucose-Capillary: 300 mg/dL — ABNORMAL HIGH (ref 70–99)
Glucose-Capillary: 258 mg/dL — ABNORMAL HIGH (ref 70–99)

## 2013-12-10 MED ORDER — FENTANYL CITRATE 0.05 MG/ML IJ SOLN
INTRAMUSCULAR | Status: AC | PRN
  Administered 2013-12-10: 100 ug via INTRAVENOUS

## 2013-12-10 MED ORDER — FENTANYL CITRATE 0.05 MG/ML IJ SOLN
INTRAMUSCULAR | Status: AC
  Filled 2013-12-10: qty 4

## 2013-12-10 MED ORDER — NALOXONE HCL 0.4 MG/ML IJ SOLN
INTRAMUSCULAR | Status: AC
  Filled 2013-12-10: qty 1

## 2013-12-10 MED ORDER — MIDAZOLAM HCL 2 MG/2ML IJ SOLN
INTRAMUSCULAR | Status: AC
  Filled 2013-12-10: qty 4

## 2013-12-10 MED ORDER — FLUMAZENIL 0.5 MG/5ML IV SOLN
INTRAVENOUS | Status: AC
  Filled 2013-12-10: qty 5

## 2013-12-10 MED ORDER — MIDAZOLAM HCL 2 MG/2ML IJ SOLN
INTRAMUSCULAR | Status: AC | PRN
  Administered 2013-12-10: 2 mg via INTRAVENOUS

## 2013-12-10 NOTE — Progress Notes (Signed)
Courtney Stevens   DOB:Apr 02, 1956   WU#:[id number]   GNF#:[id number]  Subjective:  Sitting up in chair at bedside.  Pathology negative for malignancy but concerning for granulomata.  She denies pain.  She tolerates PO without difficulty. Continues to spike fevers and antibiotics discontinued.   Objective:  Filed Vitals:   12/10/13 0503  BP: 121/66  Pulse: 68  Temp: 97.7 F (36.5 C)  Resp: 18    Body mass index is 24.43 kg/(m^2).  Intake/Output Summary (Last 24 hours) at 12/10/13 1011 Last data filed at 12/10/13 0535  Gross per 24 hour  Intake 2638.33 ml  Output    300 ml  Net 2338.33 ml    Sitting in chair; chronically ill appearing. Slightly overweight  Sclerae unicteric  Oropharynx clear  No peripheral adenopathy  Lungs clear -- no rales or rhonchi  Heart regular rate and rhythm  Abdomen + BS no organomegaly appreciated  MSK trace peripheral edema  Neuro nonfocal   CBG (last 3)   Recent Labs  12/09/13 1647 12/09/13 2125 12/10/13 0739  GLUCAP 256* 165* 163*     Labs:  Lab Results  Component Value Date   WBC 6.4 12/09/2013   HGB 9.6* 12/09/2013   HCT 31.0* 12/09/2013   MCV 89.9 12/09/2013   PLT 268 12/09/2013   NEUTROABS 12.0* 8/46/9629   Basic Metabolic Panel:  Recent Labs Lab 12/05/13 0540 12/06/13 0518 12/07/13 0440 12/08/13 0640 12/09/13 0540  NA 136* 139 140 135* 140  K 3.8 3.6* 4.4 4.0 3.6*  CL 97 99 101 100 104  CO2 25 28 28 25 27   GLUCOSE 155* 183* 166* 197* 151*  BUN 10 12 10 7 7   CREATININE 1.07 0.90 0.90 0.85 0.92  CALCIUM 8.6 8.4 9.0 8.3* 8.3*   GFR Estimated Creatinine Clearance: 64.8 ml/min (by C-G formula based on Cr of 0.92). Liver Function Tests:  Recent Labs Lab 12/04/13 0510 12/05/13 0540  AST 14 19  ALT 13 15  ALKPHOS 131* 123*  BILITOT 0.2* 0.3  PROT 5.7* 6.2  ALBUMIN 2.2* 2.4*   CBC:  Recent Labs Lab 12/03/13 1315  12/05/13 0540 12/06/13 0518 12/07/13 0440 12/08/13 0640 12/09/13 0540  WBC 16.5*  < > 13.7* 6.5  5.9 11.4* 6.4  NEUTROABS 15.7*  --  12.0*  --   --   --   --   HGB 10.2*  < > 10.8* 10.4* 11.2* 10.7* 9.6*  HCT 31.5*  < > 33.7* 32.6* 34.7* 33.0* 31.0*  MCV 89.0  < > 88.9 88.8 87.8 87.5 89.9  PLT 261  < > 275 261 301 265 268  < > = values in this interval not displayed. CBG:  Recent Labs Lab 12/09/13 0748 12/09/13 1150 12/09/13 1647 12/09/13 2125 12/10/13 0739  GLUCAP 147* 221* 256* 165* 163*   Microbiology Recent Results (from the past 240 hour(s))  CULTURE, BLOOD (ROUTINE X 2)     Status: None   Collection Time    12/03/13  1:15 PM      Result Value Range Status   Specimen Description BLOOD RIGHT FOREARM  5 ML IN [facility] BOTTLE   Final   Special Requests NONE   Final   Culture  Setup Time     Final   Value: 12/03/2013 19:43     Performed at [facility]   Culture     Final   Value: NO GROWTH 5 DAYS     Performed at [facility]  Status 12/09/2013 FINAL   Final  CULTURE, BLOOD (ROUTINE X 2)     Status: None   Collection Time    12/03/13  2:09 PM      Result Value Range Status   Specimen Description BLOOD RIGHT FOREARM  5 ML IN [facility] BOTTLE   Final   Special Requests NONE   Final   Culture  Setup Time     Final   Value: 12/03/2013 19:43     Performed at [facility]   Culture     Final   Value: NO GROWTH 5 DAYS     Performed at [facility]   Report Status 12/09/2013 FINAL   Final  CULTURE, ROUTINE-ABSCESS     Status: None   Collection Time    12/04/13  4:19 PM      Result Value Range Status   Specimen Description LIVER   Final   Special Requests NONE   Final   Gram Stain     Final   Value: NO WBC SEEN     NO SQUAMOUS EPITHELIAL CELLS SEEN     NO ORGANISMS SEEN     Performed at [facility]   Culture     Final   Value: NO GROWTH 2 DAYS     Performed at [facility]   Report Status 12/08/2013 FINAL   Final  CULTURE, EXPECTORATED SPUTUM-ASSESSMENT     Status: None   Collection Time    12/08/13 12:28 PM       Result Value Range Status   Specimen Description SPUTUM   Final   Special Requests NONE   Final   Sputum evaluation     Final   Value: MICROSCOPIC FINDINGS SUGGEST THAT THIS SPECIMEN IS NOT REPRESENTATIVE OF LOWER RESPIRATORY SECRETIONS. PLEASE RECOLLECT.     INFORMED DANA RN AT [id number] ON 01.21.15 BY SHUEA   Report Status 12/08/2013 FINAL   Final  CULTURE, EXPECTORATED SPUTUM-ASSESSMENT     Status: None   Collection Time    12/09/13  7:16 AM      Result Value Range Status   Specimen Description SPUTUM   Final   Special Requests Normal   Final   Sputum evaluation     Final   Value: MICROSCOPIC FINDINGS SUGGEST THAT THIS SPECIMEN IS NOT REPRESENTATIVE OF LOWER RESPIRATORY SECRETIONS. PLEASE RECOLLECT.     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 - NO EVIDENCE OF MALIGNANCY. Microscopic Comment AFB, GMS, and PAS stains are negative for acid fast bacilli and fungal organisms, respectively. All controls stained appropriately. Immunostains for Hepar-1 and CK were performed and confirmed the above interpretation. An infectious etiology is favored. Clinical correlation is highly recommended. Dr. Gari Crown has also reviewed the case and agrees. Aldona Bar MD Pathologist, Electronic Signature (Case signed 12/09/2013) Studies:   RADIOGRAPHIC STUDIES:  IMPRESSION:  CT CHEST (2013/05/03)  1. The previously noted 1-3 mm pulmonary nodules have either resolved, decreased in size or are stable compared to the prior examination. Given the lack of a clearly definable craniocaudal gradient, and the behavior these nodules, these favored to reflect benign disease (particularly if there has been no interval chemotherapy or other intervention), but are nonspecific. Continued attention on future follow up studies is  suggested.  2. Atherosclerosis, including three-vessel coronary artery  disease. Please note that although the presence of coronary artery calcium documents the presence of coronary artery disease, the severity of this disease and any potential stenosis cannot be assessed on this non-gated CT examination. Assessment for  potential risk factor modification, dietary therapy or pharmacologic therapy may be warranted, if clinically indicated.  3. Additional incidental findings, as above.  No results found.  Assessment: 58 y.o.  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 We were consulted for recommendations on further oncology workup.   1. SIRS, concerning for granulomatous disese.  --Management per primary team.  --ID on board.  Off antibiotics.  Awaiting repeat biopsy for additional test given pathology report consistent with acute and chronic granulomatous inflammation with associated graulomata .     2. 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  Tumor markers are as noted above.  (see prior notes for detailed history)  3. DM2.  --As noted above.   4. Normocytic anemia likely Anemia of chronic disease. --She is asymptomatic for anemia presently.  Transfuse prn symptoms.  5. Disposition.  --Full Code.   We will follow this patient.    LOS: 4 days  Pistol Kessenich, MD 12/10/2013  10:11 AM

## 2013-12-10 NOTE — Progress Notes (Signed)
[facility]  Date of Admission:  12/03/2013  Antibiotics: Antibiotics Given (last 72 hours)   Date/Time Action Medication Dose Rate   12/07/13 1946 Given   vancomycin (VANCOCIN) IVPB 1000 mg/200 mL premix 1,000 mg 200 mL/hr   12/07/13 2132 Given   ceFEPIme (MAXIPIME) 2 g in dextrose 5 % 50 mL IVPB 2 g 100 mL/hr   12/07/13 2134 Given   metroNIDAZOLE (FLAGYL) tablet 500 mg 500 mg    12/08/13 0556 Given   metroNIDAZOLE (FLAGYL) tablet 500 mg 500 mg    12/08/13 0848 Given   vancomycin (VANCOCIN) IVPB 1000 mg/200 mL premix 1,000 mg 200 mL/hr   12/08/13 1113 Given   ceFEPIme (MAXIPIME) 2 g in dextrose 5 % 50 mL IVPB 2 g 100 mL/hr   12/08/13 1526 Given   metroNIDAZOLE (FLAGYL) tablet 500 mg 500 mg    12/08/13 1959 Given   vancomycin (VANCOCIN) IVPB 1000 mg/200 mL premix 1,000 mg 200 mL/hr   12/08/13 2206 Given   ceFEPIme (MAXIPIME) 2 g in dextrose 5 % 50 mL IVPB 2 g 100 mL/hr   12/08/13 2208 Given   metroNIDAZOLE (FLAGYL) tablet 500 mg 500 mg    12/09/13 2595 Given   metroNIDAZOLE (FLAGYL) tablet 500 mg 500 mg    12/09/13 6387 Given   vancomycin (VANCOCIN) IVPB 1000 mg/200 mL premix 1,000 mg 200 mL/hr   12/09/13 1040 Given   ceFEPIme (MAXIPIME) 2 g in dextrose 5 % 50 mL IVPB 2 g 100 mL/hr   12/09/13 1400 Given   metroNIDAZOLE (FLAGYL) tablet 500 mg 500 mg       Subjective: Pt currently at IR for Biopsy  Objective: Temp:  [97.7 F (36.5 C)-103.1 F (39.5 C)] 99.9 F (37.7 C) (01/23 1300) Pulse Rate:  [68-121] 107 (01/23 1354) Resp:  [18-20] 18 (01/23 1354) BP: (104-132)/(66-83) 114/68 mmHg (01/23 1354) SpO2:  [92 %-100 %] 92 % (01/23 1354)  General: not available  Lab Results Lab Results  Component Value Date   WBC 6.4 12/09/2013   HGB 9.6* 12/09/2013   HCT 31.0* 12/09/2013   MCV 89.9 12/09/2013   PLT 268 12/09/2013    Lab Results  Component Value Date   CREATININE 0.92 12/09/2013   BUN 7 12/09/2013   NA 140 12/09/2013   K 3.6*  12/09/2013   CL 104 12/09/2013   CO2 27 12/09/2013    Lab Results  Component Value Date   ALT 15 12/05/2013   AST 19 12/05/2013   ALKPHOS 123* 12/05/2013   BILITOT 0.3 12/05/2013      Microbiology: Recent Results (from the past 240 hour(s))  CULTURE, BLOOD (ROUTINE X 2)     Status: None   Collection Time    12/03/13  1:15 PM      Result Value Range Status   Specimen Description BLOOD RIGHT FOREARM  5 ML IN [facility] BOTTLE   Final   Special Requests NONE   Final   Culture  Setup Time     Final   Value: 12/03/2013 19:43     Performed at [facility]   Culture     Final   Value: NO GROWTH 5 DAYS     Performed at [facility]   Report Status 12/09/2013 FINAL   Final  CULTURE, BLOOD (ROUTINE X 2)     Status: None   Collection Time    12/03/13  2:09 PM      Result Value Range Status  Specimen Description BLOOD RIGHT FOREARM  5 ML IN [facility] BOTTLE   Final   Special Requests NONE   Final   Culture  Setup Time     Final   Value: 12/03/2013 19:43     Performed at [facility]   Culture     Final   Value: NO GROWTH 5 DAYS     Performed at [facility]   Report Status 12/09/2013 FINAL   Final  CULTURE, ROUTINE-ABSCESS     Status: None   Collection Time    12/04/13  4:19 PM      Result Value Range Status   Specimen Description LIVER   Final   Special Requests NONE   Final   Gram Stain     Final   Value: NO WBC SEEN     NO SQUAMOUS EPITHELIAL CELLS SEEN     NO ORGANISMS SEEN     Performed at [facility]   Culture     Final   Value: NO GROWTH 2 DAYS     Performed at [facility]   Report Status 12/08/2013 FINAL   Final  CULTURE, EXPECTORATED SPUTUM-ASSESSMENT     Status: None   Collection Time    12/08/13 12:28 PM      Result Value Range Status   Specimen Description SPUTUM   Final   Special Requests NONE   Final   Sputum evaluation     Final   Value: MICROSCOPIC FINDINGS SUGGEST THAT THIS SPECIMEN IS NOT REPRESENTATIVE OF LOWER  RESPIRATORY SECRETIONS. PLEASE RECOLLECT.     INFORMED DANA RN AT [id number] ON 01.21.15 BY SHUEA   Report Status 12/08/2013 FINAL   Final  CULTURE, EXPECTORATED SPUTUM-ASSESSMENT     Status: None   Collection Time    12/09/13  7:16 AM      Result Value Range Status   Specimen Description SPUTUM   Final   Special Requests Normal   Final   Sputum evaluation     Final   Value: MICROSCOPIC FINDINGS SUGGEST THAT THIS SPECIMEN IS NOT REPRESENTATIVE OF LOWER RESPIRATORY SECRETIONS. PLEASE RECOLLECT.     INFORMED DANDA [facility] RN AT [id number] ON 01.22.15 BY SHUEA   Report Status 12/09/2013 FINAL   Final    Studies/Results: No results found.  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  Stains all negative.  Appears more infectious/inflammatory than cancer.  Differential broad including fungal, AFB including tuberculosis, sarcoid and other autoimmune causes.  Continues to have fever and seems source related to this.   -IR re-biopsy for culture being done now -Restart vancomycin and cefepime if she becomes hemodynamically unstable but not likely bacterial in origin since she really had no benefit after a prolonged course and no pus noted on initial biopsy.   -Interestingly, she did get worse after a steroid burst from her PCP which would make autoimmune less likely and more infectious/mycobacterial.    Dr. Megan Salon is on over the weekend and will monitor labs and cultures.  Please call if there are any concerns, otherwise I will follow up on Monday.    [name], [facility] [place] [email or web address] [phone] pager   [phone] cell 12/10/2013, 1:56 PM

## 2013-12-10 NOTE — Progress Notes (Signed)
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 Willette Brace liver biopsy with fungal and AFB stains and culture  -fungal, brucella, coxiella serologies sent; QuantiFERON-TB  -agree with d/c antibacterials  -case discussed with IR, Dr. 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 Birdie Sons at [facility] center on 02/22/2008.  -radical resection for node-positive adenocarcinoma of the bile duct with positive lymph node involvement (2 out of 17 lymph nodes) with positive surgical margins.  -appreciated Dr. Juliann Mule followup  DM2  -12/07/2013 hemoglobin A1c 7.9  -Continue Humalog sliding scale  -Add NovoLog 4 units before each meal  -start Lantus 5 units  Pancreatic insufficiency  -Continue Creon  -Patient is eating well  Hypothyroidism  -Continue Synthroid  Anemia of chronic disease  -Hemoglobin is stable  Family Communication: Pt at beside  Disposition Plan: Home when medically stable         Procedures/Studies: Dg Chest 2 View  12/03/2013   CLINICAL DATA:  Fever, cough.  EXAM: CHEST  2 VIEW  COMPARISON:  November 25, 2013.  FINDINGS: The heart size and mediastinal contours are within normal limits. Both lungs are clear. No pleural effusion or pneumothorax is noted. The visualized skeletal structures are unremarkable.  IMPRESSION: No active cardiopulmonary  disease.   Electronically Signed   By: [name] M.D.   On: 12/03/2013 14:14   Dg Chest 2 View  11/25/2013   CLINICAL DATA:  Mental status change, fever and chills, history of biliary tract cancer  EXAM: CHEST  2 VIEW  COMPARISON:  CT scan of this thorax dated November 19, 2013 and chest x-ray of the same day.  FINDINGS: The lungs are adequately inflated. There is no focal infiltrate. There is mild symmetric prominence of the pulmonary interstitial markings bilaterally. There is no pleural effusion. The cardiopericardial silhouette is normal in size. The pulmonary vascularity is not engorged. The mediastinum is normal in width very there is no pleural effusion. The observed portions of the bony thorax exhibit no acute abnormalities. There are surgical clips in the left upper quadrant of the abdomen.  IMPRESSION: There is no evidence of pneumonia nor CHF nor other acute cardiopulmonary abnormality. Mild prominence of the pulmonary interstitial markings bilaterally may be related to the patient's previous smoking history.   Electronically Signed   By: Emmie Frakes  [place]   On: 11/25/2013 16:18   Dg Chest 2 View  11/19/2013   CLINICAL DATA:  Shortness of breath and headache.  EXAM: CHEST  2 VIEW  COMPARISON:  03/02/2010  FINDINGS: Lungs are adequately inflated without consolidation or effusion. Cardiomediastinal silhouette is within normal. There is mild spondylosis of the spine. Multiple surgical clips over the abdomen.  IMPRESSION: No active cardiopulmonary disease.   Electronically Signed   By: Marin Olp M.D.   On: 11/19/2013 13:25   Ct Head Wo Contrast  11/19/2013   CLINICAL DATA:  Headache, no known injury; history of adenocarcinoma of the bile duct  EXAM: CT HEAD WITHOUT CONTRAST  TECHNIQUE: Contiguous axial images  were obtained from the base of the skull through the vertex without intravenous contrast.  COMPARISON:  PET-CT -06/12/2009  FINDINGS: Gray-white differentiation is maintained. Incidental note is  made of prominent VR spaces within the caudal medial aspects of the bilateral temporal lobes. No CT evidence for acute large territory infarct. No intraparenchymal or extra-axial mass or hemorrhage. Normal size and configuration of the ventricles and basilar cisterns. No midline. 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 The remaining paranasal sinuses mastoid air cells appear normally aerated. Regional soft tissues appear normal. No displaced calvarial fracture.  IMPRESSION: Negative noncontrast head CT.   Electronically Signed   By: Simonne Come M.D.   On: 11/19/2013 12:58   Ct Angio Chest Pe W/cm &/or Wo Cm  11/19/2013   CLINICAL DATA:  Shortness of breath, chest pain.  EXAM: CT ANGIOGRAPHY CHEST WITH CONTRAST  TECHNIQUE: Multidetector CT imaging of the chest was performed using the standard protocol during bolus administration of intravenous contrast. Multiplanar CT image reconstructions including MIPs were obtained to evaluate the vascular anatomy.  CONTRAST:  OMNIPAQUE IOHEXOL 350 MG/ML SOLN  COMPARISON:  CT scan of Apr 14, 2013.  FINDINGS: No pleural effusion or pneumothorax is noted. No significant pulmonary parenchymal abnormality is noted. The thoracic aorta appears normal without evidence of dissection or aneurysm. Pulmonary arteries appear normal. There is no evidence of pulmonary embolus no significant mediastinal adenopathy is noted. Ill-defined low-density measuring 4 cm is seen in the posterior segment of the right hepatic lobe ; further evaluation with MRI is recommended. No osseous abnormality is noted in the chest  Review of the MIP images confirms the above findings.  IMPRESSION: No evidence of pulmonary embolus. No acute pulmonary disease is noted. 4 cm ill-defined low density is noted in the posterior segment of the right hepatic lobe concerning for possible neoplasm ; further evaluation with MRI  is recommended.   Electronically Signed   By: Roque Lias M.D.   On: 11/19/2013 14:58   Mr Laqueta Jean VH Contrast  12/06/2013   CLINICAL DATA:  Bile duct cancer post resection 2009. Fever. Headaches. Diabetic hypertensive hyperlipidemia patient.  EXAM: MRI HEAD WITHOUT AND WITH CONTRAST  TECHNIQUE: Multiplanar, multiecho pulse sequences of the brain and surrounding structures were obtained without and with intravenous contrast.  CONTRAST:  14 cc MultiHance.  COMPARISON:  11/19/2013 CT.  No comparison brain MR.  FINDINGS: No acute infarct.  No intracranial hemorrhage.  Minimal nonspecific white matter type changes without MR evidence of intracranial infection. If this is of high clinical concern, correlation with cerebral spinal fluid analysis may then be considered.  Prominent peri vascular spaces versus remote basal ganglia lacune infarcts.  No intracranial mass or abnormal enhancement.  Major intracranial vascular structures are patent.  Cervical medullary junction, pituitary region, pineal region and orbital structures unremarkable.  Minimal mucosal thickening inferior aspect right maxillary sinus.  IMPRESSION: No acute infarct.  Minimal nonspecific white matter type changes without MR evidence of intracranial infection. If this is of high clinical concern, correlation with cerebral spinal fluid analysis may then be considered.  Prominent peri vascular spaces versus remote basal ganglia lacune infarcts.  No intracranial mass or abnormal enhancement.   Electronically Signed   By: Bridgett Larsson M.D.   On: 12/06/2013 17:08   Mr Lumbar Spine W Wo Contrast  12/06/2013   CLINICAL DATA:  Bile duct cancer. Fever. Back pain.  Query abscess/ infection.  EXAM: MRI LUMBAR SPINE WITHOUT AND WITH CONTRAST  TECHNIQUE: Multiplanar and multiecho pulse sequences of the lumbar spine were obtained without and with intravenous contrast.  CONTRAST:  63mL MULTIHANCE GADOBENATE DIMEGLUMINE 529 MG/ML IV SOLN  COMPARISON:  MR ABDOMEN  WO/W CM dated 11/25/2013; CT ABD/PELVIS W CM dated 12/03/2013  FINDINGS: The lowest lumbar type non-rib-bearing vertebra is labeled as L5. The conus medullaris appears normal. Conus level: L1-2.  No vertebral subluxation is observed. Intervertebral disc desiccation is observed at L4-5. No significant abnormal lumbar vertebral edema or vertebral enhancement identified.  Right kidney upper pole atrophy/scarring noted. No findings of discitis -osteomyelitis. No significant abnormal epidural process. No clumping of nerve roots to suggest arachnoiditis.  Despite efforts by the technologist and patient, motion artifact is present on today's exam and could not be eliminated. This reduces exam sensitivity and specificity. Additional findings at individual levels are as follows:  L1-2:  Unremarkable.  L2-3:  No impingement.  Left facet arthropathy.  L3-4:  Unremarkable.  L4-5: No impingement. Minimal disc bulge with right foraminal annular tear.  L5-S1:  No impingement.  Right greater than left facet arthropathy.  IMPRESSION: 1. No lumbar impingement or findings suspicious for infection or malignancy involving the lumbar spine. 2. Mild lumbar spondylosis and degenerative disc disease.   Electronically Signed   By: Sherryl Barters M.D.   On: 12/06/2013 16:55   Mr Abdomen W Wo Contrast  11/25/2013   CLINICAL DATA:  Abdominal pain with nausea, vomiting and fever. 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 Liver lesion on CT.  EXAM: MRI ABDOMEN WITHOUT AND WITH CONTRAST  TECHNIQUE: Multiplanar multisequence MR imaging of the abdomen was performed both before and after the administration of intravenous contrast.  CONTRAST:  56mL MULTIHANCE GADOBENATE DIMEGLUMINE 529 MG/ML IV SOLN  COMPARISON:  CT ABD/PELVIS W CM dated 02/19/2013; CT ABD/PELVIS W CM dated 08/14/2012; MR ABDOMEN WO/W CM dated 03/06/2010; CT ANGIO CHEST W/CM &/OR WO/CM dated 11/19/2013  FINDINGS: The previously demonstrated diffuse severe hepatic  steatosis has improved. Medially within the right hepatic lobe at the site of the previous geographic sparing of steatosis is a new heterogeneous mass. This demonstrates peripheral heterogeneous enhancement following contrast, measuring approximately 5.6 x 3.7 cm transverse on image 23 of series 1201. There is inferior extension of tumor along the posteromedial hepatic capsule. There is extrinsic mass effect on the IVC which is narrowed, but patent. The hepatic and portal veins are patent. The dominant liver lesion demonstrates central cystic or necrotic components which do not enhance.  No other focal liver lesions are identified. There is stable mild intrahepatic biliary dilatation status post choledochojejunostomy. There is stable atrophy of the pancreatic body and tail. Soft tissue stranding and enhancement surrounding the duodenum and IVC are similar to the prior examination, likely postsurgical in etiology.  The spleen, adrenal glands and kidneys demonstrate no significant findings. There is no hydronephrosis. There is upper abdominal adenopathy within the portacaval space and retroperitoneum. There is a 1.4 cm aortic caval node which demonstrates central necrosis. There is a new enhancing 11 mm nodule inferior to the right hepatic lobe which may reflect a small peritoneal implant. No significant ascites is seen.  IMPRESSION: 1. As demonstrated on recent Chest CT, there is a new peripherally enhancing mass posteromedially within the right hepatic lobe, highly worrisome for local recurrence or metastatic disease from the patient's bile duct adenocarcinoma. A liver abscess could have this appearance, although that possibility is  considered much less likely. 2. Associated necrotic upper abdominal adenopathy, also worrisome for metastatic disease. Possible small peritoneal implant adjacent to the right hepatic lobe. 3. No significant biliary dilatation. 4. Extrinsic mass effect on the IVC without evidence of  occlusion.   Electronically Signed   By: Camie Patience M.D.   On: 11/25/2013 18:37   Ct Abdomen Pelvis W Contrast  12/03/2013   CLINICAL DATA:  History of Whipple procedure for bile duct carcinoma. New sepsis. New liver lesion  EXAM: CT ABDOMEN AND PELVIS WITH CONTRAST  TECHNIQUE: Multidetector CT imaging of the abdomen and pelvis was performed using the standard protocol following bolus administration of intravenous contrast.  CONTRAST:  186mL OMNIPAQUE IOHEXOL 300 MG/ML  SOLN  COMPARISON:  MR ABDOMEN WO/W CM dated 11/25/2013  FINDINGS: Lung bases are clear.  No pericardial fluid.  Again demonstrated a large subcapsular lesion within the posterior right hepatic lobe measuring 4.5 x 4.5 cm similar to 4.8 x 4.3 cm on comparison MRI. This lesion continues demonstrate mild peripheral enhancement and central heterogeneity. No additional hepatic lesions. There is pneumobilia in the left hepatic lobe consistent with choledochojejunostomy. There is some edema involving the bowel along the porta hepatis and small amount of fluid small of fluid along Morrison's pouch is similar prior.  Small nodule inferior right hepatic lobe measuring 8 mm unchanged (image 31, series 2 ). The body and tail of the pancreas normal. The spleen, adrenal glands, kidneys are normal. There is some cortical thinning on the right.  Small bowel and colon are unremarkable. There is no evidence of abscess in the pelvis.  Post hysterectomy anatomy. The bladder is normal. No pelvic lymphadenopathy. No aggressive osseous lesion.  IMPRESSION: 1. No change in large enhancing lesion the posterior aspect of the right hepatic lobe. In patient with sepsis of unknown origin would consider hepatic abscess as a distinct possibility although adenocarcinoma recurrence cannot be excluded. Recommend ultrasound guided percutaneous biopsy of this lesion to differentiate between abscess and malignancy. Favor abscess. 2. Edema along the proximal bowel and Morrison's pouch  likely related to the hepatic process. 3. Patient status post a Whipple procedure without complicating features. This was made a call report.   Electronically Signed   By: Suzy Bouchard M.D.   On: 12/03/2013 20:18   US Biopsy  12/04/2013   CLINICAL DATA:  58 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 Patient has an elevated white count and chills. Evaluate for recurrent cancer versus abscess.  EXAM: ULTRASOUND GUIDED BIOPSY OF THE LIVER LESION  MEDICATIONS: 2.0 mg IV Versed; 100 mcg IV Fentanyl. A radiology nurse monitored the patient throughout the procedure.  Total Moderate Sedation Time: 25 min  PROCEDURE: The procedure, risks, benefits, and alternatives were explained to the patient. Questions regarding the procedure were encouraged and answered. The patient understands and consents to the procedure.  The right upper abdomen was prepped with chlorhexidine. A sterile drape was placed. The skin and liver capsule were anesthetized with 1% lidocaine. A 17 gauge needle was directed into the posterior right hepatic lesion and tried to aspirate fluid from the 17 gauge needle. No purulent fluid was obtained. A small amount of bloody fluid was collected. Three core biopsies were obtained with an 18 gauge device. Specimens were placed in formalin. Needle removed without complication.  COMPLICATIONS: None.  FINDINGS: Heterogeneous hypoechoic lesion in the posterior right hepatic lobe. Needle placement was confirmed within the lesion. Following the first core biopsy, there was development  of multiple echogenic foci throughout the lesion. Echogenic foci probably represent gas from the biliary system. No significant bleeding.  IMPRESSION: Ultrasound-guided core biopsies of the liver lesion. Solid core material was obtained from the biopsies and no purulent fluid could be obtained. Findings are suspicious for recurrent cancer rather than abscess. A sample of the aspirated blood was sent for Gram  stain and culture.   Electronically Signed   By: Markus Daft M.D.   On: 12/04/2013 16:36         Subjective: Patient continues to have fevers and chills. Denies any chest pain, shortness of breath, nausea, vomiting, diarrhea, hematochezia, melena. She continues to have right upper quadrant pain which has been chronic. It is not worse. She has a headache when she has fever. Otherwise she has no headache.  Objective: Filed Vitals:   12/10/13 0503 12/10/13 1042 12/10/13 1148 12/10/13 1221  BP: 121/66     Pulse: 68     Temp: 97.7 F (36.5 C) 99.1 F (37.3 C) 99.8 F (37.7 C) 103.1 F (39.5 C)  TempSrc: Oral Oral  Oral  Resp: 18     Height:      Weight:      SpO2: 97%       Intake/Output Summary (Last 24 hours) at 12/10/13 1253 Last data filed at 12/10/13 1030  Gross per 24 hour  Intake 2588.33 ml  Output   1300 ml  Net 1288.33 ml   Weight change:  Exam:   General:  Pt is alert, follows commands appropriately, not in acute distress  HEENT: No icterus, No thrush, No meningismus, [place]/AT  Cardiovascular: RRR, S1/S2, no rubs, no gallops  Respiratory: CTA bilaterally, no wheezing, no crackles, no rhonchi  Abdomen: Soft/+BS, RUQ tender without rebound, non distended, no guarding  Extremities: trace LE edema, No lymphangitis, No petechiae, No rashes, no synovitis  Data Reviewed: Basic Metabolic Panel:  Recent Labs Lab 12/05/13 0540 12/06/13 0518 12/07/13 0440 12/08/13 0640 12/09/13 0540  NA 136* 139 140 135* 140  K 3.8 3.6* 4.4 4.0 3.6*  CL 97 99 101 100 104  CO2 25 28 28 25 27   GLUCOSE 155* 183* 166* 197* 151*  BUN 10 12 10 7 7   CREATININE 1.07 0.90 0.90 0.85 0.92  CALCIUM 8.6 8.4 9.0 8.3* 8.3*   Liver Function Tests:  Recent Labs Lab 12/04/13 0510 12/05/13 0540  AST 14 19  ALT 13 15  ALKPHOS 131* 123*  BILITOT 0.2* 0.3  PROT 5.7* 6.2  ALBUMIN 2.2* 2.4*   No results found for this basename: LIPASE, AMYLASE,  in the last 168 hours No results found  for this basename: AMMONIA,  in the last 168 hours CBC:  Recent Labs Lab 12/03/13 1315  12/05/13 0540 12/06/13 0518 12/07/13 0440 12/08/13 0640 12/09/13 0540  WBC 16.5*  < > 13.7* 6.5 5.9 11.4* 6.4  NEUTROABS 15.7*  --  12.0*  --   --   --   --   HGB 10.2*  < > 10.8* 10.4* 11.2* 10.7* 9.6*  HCT 31.5*  < > 33.7* 32.6* 34.7* 33.0* 31.0*  MCV 89.0  < > 88.9 88.8 87.8 87.5 89.9  PLT 261  < > 275 261 301 265 268  < > = values in this interval not displayed. Cardiac Enzymes: No results found for this basename: CKTOTAL, CKMB, CKMBINDEX, TROPONINI,  in the last 168 hours BNP: No components found with this basename: POCBNP,  CBG:  Recent Labs Lab 12/09/13 1150 12/09/13  1647 12/09/13 2125 12/10/13 0739 12/10/13 1202  GLUCAP 221* 256* 165* 163* 161*    Recent Results (from the past 240 hour(s))  CULTURE, BLOOD (ROUTINE X 2)     Status: None   Collection Time    12/03/13  1:15 PM      Result Value Range Status   Specimen Description BLOOD RIGHT FOREARM  5 ML IN [facility] BOTTLE   Final   Special Requests NONE   Final   Culture  Setup Time     Final   Value: 12/03/2013 19:43     Performed at [facility]   Culture     Final   Value: NO GROWTH 5 DAYS     Performed at [facility]   Report Status 12/09/2013 FINAL   Final  CULTURE, BLOOD (ROUTINE X 2)     Status: None   Collection Time    12/03/13  2:09 PM      Result Value Range Status   Specimen Description BLOOD RIGHT FOREARM  5 ML IN [facility] BOTTLE   Final   Special Requests NONE   Final   Culture  Setup Time     Final   Value: 12/03/2013 19:43     Performed at [facility]   Culture     Final   Value: NO GROWTH 5 DAYS     Performed at [facility]   Report Status 12/09/2013 FINAL   Final  CULTURE, ROUTINE-ABSCESS     Status: None   Collection Time    12/04/13  4:19 PM      Result Value Range Status   Specimen Description LIVER   Final   Special Requests NONE   Final   Gram Stain      Final   Value: NO WBC SEEN     NO SQUAMOUS EPITHELIAL CELLS SEEN     NO ORGANISMS SEEN     Performed at [facility]   Culture     Final   Value: NO GROWTH 2 DAYS     Performed at [facility]   Report Status 12/08/2013 FINAL   Final  CULTURE, EXPECTORATED SPUTUM-ASSESSMENT     Status: None   Collection Time    12/08/13 12:28 PM      Result Value Range Status   Specimen Description SPUTUM   Final   Special Requests NONE   Final   Sputum evaluation     Final   Value: MICROSCOPIC FINDINGS SUGGEST THAT THIS SPECIMEN IS NOT REPRESENTATIVE OF LOWER RESPIRATORY SECRETIONS. PLEASE RECOLLECT.     INFORMED DANA RN AT [id number] ON 01.21.15 BY SHUEA   Report Status 12/08/2013 FINAL   Final  CULTURE, EXPECTORATED SPUTUM-ASSESSMENT     Status: None   Collection Time    12/09/13  7:16 AM      Result Value Range Status   Specimen Description SPUTUM   Final   Special Requests Normal   Final   Sputum evaluation     Final   Value: MICROSCOPIC FINDINGS SUGGEST THAT THIS SPECIMEN IS NOT REPRESENTATIVE OF LOWER RESPIRATORY SECRETIONS. PLEASE RECOLLECT.     INFORMED DANDA DARKE RN AT [id number] ON 01.22.15 BY SHUEA   Report Status 12/09/2013 FINAL   Final     Scheduled Meds: . calcium-vitamin D  1 tablet Oral TID WC  . enoxaparin (LOVENOX) injection  40 mg Subcutaneous Q2200  . furosemide  40 mg Oral BID  . insulin aspart  0-15  Units Subcutaneous TID WC  . insulin aspart  4 Units Subcutaneous TID WC  . insulin glargine  5 Units Subcutaneous QHS  . iron polysaccharides  150 mg Oral Daily  . levothyroxine  100 mcg Oral Q48H  . levothyroxine  200 mcg Oral Q48H  . lipase/protease/amylase  1 capsule Oral TID AC  . loratadine  10 mg Oral Daily  . magnesium oxide  200 mg Oral TID PC & HS  . multivitamin with minerals  1 tablet Oral Daily  . pantoprazole  40 mg Oral Daily  . potassium chloride SA  20 mEq Oral BID  . sodium chloride  3 mL Intravenous Q12H  . Vitamin D (Ergocalciferol)  50,000  Units Oral QPM   Continuous Infusions: . sodium chloride 100 mL/hr at 12/10/13 0640     Sammy Cassar, DO  [facility] Pager [phone]  If 7PM-7AM, please contact night-coverage [email or web address] Password [facility] 12/10/2013, 12:53 PM   LOS: 7 days

## 2013-12-10 NOTE — Procedures (Signed)
Procedure:  Ultrasound guided FNA and core biopsy of right lobe liver lesion Findings:  22 G FNA x 2, 20 G FNA x 1 and 20 G core biopsy x 2 of right lobe liver lesion combined with sterile saline and sent for culture.

## 2013-12-11 LAB — GLUCOSE, CAPILLARY
GLUCOSE-CAPILLARY: 142 mg/dL — AB (ref 70–99)
GLUCOSE-CAPILLARY: 220 mg/dL — AB (ref 70–99)
Glucose-Capillary: 149 mg/dL — ABNORMAL HIGH (ref 70–99)

## 2013-12-11 LAB — CBC
HCT: 30.6 % — ABNORMAL LOW (ref 36.0–46.0)
Hemoglobin: 9.7 g/dL — ABNORMAL LOW (ref 12.0–15.0)
MCH: 28.4 pg (ref 26.0–34.0)
MCHC: 31.7 g/dL (ref 30.0–36.0)
MCV: 89.7 fL (ref 78.0–100.0)
PLATELETS: 281 10*3/uL (ref 150–400)
RBC: 3.41 MIL/uL — ABNORMAL LOW (ref 3.87–5.11)
RDW: 15.3 % (ref 11.5–15.5)
WBC: 7.1 10*3/uL (ref 4.0–10.5)

## 2013-12-11 LAB — BASIC METABOLIC PANEL
BUN: 11 mg/dL (ref 6–23)
CALCIUM: 8.5 mg/dL (ref 8.4–10.5)
CHLORIDE: 102 meq/L (ref 96–112)
CO2: 29 mEq/L (ref 19–32)
CREATININE: 0.86 mg/dL (ref 0.50–1.10)
GFR, EST AFRICAN AMERICAN: 85 mL/min — AB (ref >90)
GFR, EST NON AFRICAN AMERICAN: 73 mL/min — AB (ref >90)
Glucose, Bld: 156 mg/dL — ABNORMAL HIGH (ref 70–99)
Potassium: 3.4 mEq/L — ABNORMAL LOW (ref 3.7–5.3)
Sodium: 140 mEq/L (ref 137–147)

## 2013-12-11 LAB — MAGNESIUM: MAGNESIUM: 2.2 mg/dL (ref 1.5–2.5)

## 2013-12-11 NOTE — Progress Notes (Signed)
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 Willette Brace liver biopsy with fungal and AFB stains and culture  -fungal, brucella, coxiella serologies sent; QuantiFERON-TB  -agree with d/c antibacterials  -case discussed with IR, Dr. 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 Birdie Sons at [facility] center on 02/22/2008.  -radical resection for node-positive adenocarcinoma of the bile duct with positive lymph node involvement (2 out of 17 lymph nodes) with positive surgical margins.  -appreciated Dr. Juliann Mule followup  DM2  -12/07/2013 hemoglobin A1c 7.9  -Continue Humalog sliding scale  -increase NovoLog 6 units before each meal  -start Lantus 5 units  Pancreatic insufficiency  -Continue Creon  -Patient is eating well  Hypothyroidism  -Continue Synthroid  Anemia of chronic disease  -Hemoglobin is stable  Family Communication: Pt at beside  Disposition Plan: Home when medically stable          Procedures/Studies: Dg Chest 2 View  12/03/2013   CLINICAL DATA:  Fever, cough.  EXAM: CHEST  2 VIEW  COMPARISON:  November 25, 2013.  FINDINGS: The heart size and mediastinal contours are within normal limits. Both  lungs are clear. No pleural effusion or pneumothorax is noted. The visualized skeletal structures are unremarkable.  IMPRESSION: No active cardiopulmonary disease.   Electronically Signed   By: [name] M.D.   On: 12/03/2013 14:14   Dg Chest 2 View  11/25/2013   CLINICAL DATA:  Mental status change, fever and chills, history of biliary tract cancer  EXAM: CHEST  2 VIEW  COMPARISON:  CT scan of this thorax dated November 19, 2013 and chest x-ray of the same day.  FINDINGS: The lungs are adequately inflated. There is no focal infiltrate. There is mild symmetric prominence of the pulmonary interstitial markings bilaterally. There is no pleural effusion. The cardiopericardial silhouette is normal in size. The pulmonary vascularity is not engorged. The mediastinum is normal in width very there is no pleural effusion. The observed portions of the bony thorax exhibit no acute abnormalities. There are surgical clips in the left upper quadrant of the abdomen.  IMPRESSION: There is no evidence of pneumonia nor CHF nor other acute cardiopulmonary abnormality. Mild prominence of the pulmonary interstitial markings bilaterally may be related to the patient's previous smoking history.   Electronically Signed   By: Anitha Kreiser  [place]   On: 11/25/2013 16:18   Dg Chest 2 View  11/19/2013   CLINICAL DATA:  Shortness of breath and headache.  EXAM: CHEST  2 VIEW  COMPARISON:  03/02/2010  FINDINGS: Lungs are adequately inflated without consolidation or effusion. Cardiomediastinal silhouette is within normal. There is mild spondylosis of the spine. Multiple surgical clips over the abdomen.  IMPRESSION: No active cardiopulmonary disease.   Electronically Signed   By: Marin Olp M.D.   On: 11/19/2013 13:25   Ct Head Wo Contrast  11/19/2013  CLINICAL DATA:  Headache, no known injury; history of adenocarcinoma of the bile duct  EXAM: CT HEAD WITHOUT CONTRAST  TECHNIQUE: Contiguous axial images were obtained from the base of the skull  through the vertex without intravenous contrast.  COMPARISON:  PET-CT -06/12/2009  FINDINGS: Gray-white differentiation is maintained. Incidental note is made of prominent VR spaces within the caudal medial aspects of the bilateral temporal lobes. No CT evidence for acute large territory infarct. No intraparenchymal or extra-axial mass or hemorrhage. Normal size and configuration of the ventricles and basilar cisterns. No midline. 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 The remaining paranasal sinuses mastoid air cells appear normally aerated. Regional soft tissues appear normal. No displaced calvarial fracture.  IMPRESSION: Negative noncontrast head CT.   Electronically Signed   By: [name] M.D.   On: 11/19/2013 12:58   Ct Angio Chest Pe W/cm &/or Wo Cm  11/19/2013   CLINICAL DATA:  Shortness of breath, chest pain.  EXAM: CT ANGIOGRAPHY CHEST WITH CONTRAST  TECHNIQUE: Multidetector CT imaging of the chest was performed using the standard protocol during bolus administration of intravenous contrast. Multiplanar CT image reconstructions including MIPs were obtained to evaluate the vascular anatomy.  CONTRAST:  191mL OMNIPAQUE IOHEXOL 350 MG/ML SOLN  COMPARISON:  CT scan of Apr 14, 2013.  FINDINGS: No pleural effusion or pneumothorax is noted. No significant pulmonary parenchymal abnormality is noted. The thoracic aorta appears normal without evidence of dissection or aneurysm. Pulmonary arteries appear normal. There is no evidence of pulmonary embolus no significant mediastinal adenopathy is noted. Ill-defined low-density measuring 4 cm is seen in the posterior segment of the right hepatic lobe ; further evaluation with MRI is recommended. No osseous abnormality is noted in the chest  Review of the MIP images confirms the above findings.  IMPRESSION: No evidence of pulmonary embolus. No acute pulmonary disease is noted.  4 cm ill-defined low density is noted in the posterior segment of the right hepatic lobe concerning for possible neoplasm ; further evaluation with MRI is recommended.   Electronically Signed   By: [name] M.D.   On: 11/19/2013 14:58   Mr Jeri Cos ZO Contrast  12/06/2013   CLINICAL DATA:  Bile duct cancer post resection 2009. Fever. Headaches. Diabetic hypertensive hyperlipidemia patient.  EXAM: MRI HEAD WITHOUT AND WITH CONTRAST  TECHNIQUE: Multiplanar, multiecho pulse sequences of the brain and surrounding structures were obtained without and with intravenous contrast.  CONTRAST:  14 cc MultiHance.  COMPARISON:  11/19/2013 CT.  No comparison brain MR.  FINDINGS: No acute infarct.  No intracranial hemorrhage.  Minimal nonspecific white matter type changes without MR evidence of intracranial infection. If this is of high clinical concern, correlation with cerebral spinal fluid analysis may then be considered.  Prominent peri vascular spaces versus remote basal ganglia lacune infarcts.  No intracranial mass or abnormal enhancement.  Major intracranial vascular structures are patent.  Cervical medullary junction, pituitary region, pineal region and orbital structures unremarkable.  Minimal mucosal thickening inferior aspect right maxillary sinus.  IMPRESSION: No acute infarct.  Minimal nonspecific white matter type changes without MR evidence of intracranial infection. If this is of high clinical concern, correlation with cerebral spinal fluid analysis may then be considered.  Prominent peri vascular spaces versus remote basal ganglia lacune infarcts.  No intracranial mass or abnormal enhancement.   Electronically Signed   By: Mignon Pine.D.  On: 12/06/2013 17:08   Mr Lumbar Spine W Wo Contrast  12/06/2013   CLINICAL DATA:  Bile duct cancer. Fever. Back pain. Query abscess/ infection.  EXAM: MRI LUMBAR SPINE WITHOUT AND WITH CONTRAST  TECHNIQUE: Multiplanar and multiecho pulse sequences of the lumbar  spine were obtained without and with intravenous contrast.  CONTRAST:  55mL MULTIHANCE GADOBENATE DIMEGLUMINE 529 MG/ML IV SOLN  COMPARISON:  MR ABDOMEN WO/W CM dated 11/25/2013; CT ABD/PELVIS W CM dated 12/03/2013  FINDINGS: The lowest lumbar type non-rib-bearing vertebra is labeled as L5. The conus medullaris appears normal. Conus level: L1-2.  No vertebral subluxation is observed. Intervertebral disc desiccation is observed at L4-5. No significant abnormal lumbar vertebral edema or vertebral enhancement identified.  Right kidney upper pole atrophy/scarring noted. No findings of discitis -osteomyelitis. No significant abnormal epidural process. No clumping of nerve roots to suggest arachnoiditis.  Despite efforts by the technologist and patient, motion artifact is present on today's exam and could not be eliminated. This reduces exam sensitivity and specificity. Additional findings at individual levels are as follows:  L1-2:  Unremarkable.  L2-3:  No impingement.  Left facet arthropathy.  L3-4:  Unremarkable.  L4-5: No impingement. Minimal disc bulge with right foraminal annular tear.  L5-S1:  No impingement.  Right greater than left facet arthropathy.  IMPRESSION: 1. No lumbar impingement or findings suspicious for infection or malignancy involving the lumbar spine. 2. Mild lumbar spondylosis and degenerative disc disease.   Electronically Signed   By: Sherryl Barters M.D.   On: 12/06/2013 16:55   Mr Abdomen W Wo Contrast  11/25/2013   CLINICAL DATA:  Abdominal pain with nausea, vomiting and fever. 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 Liver lesion on CT.  EXAM: MRI ABDOMEN WITHOUT AND WITH CONTRAST  TECHNIQUE: Multiplanar multisequence MR imaging of the abdomen was performed both before and after the administration of intravenous contrast.  CONTRAST:  95mL MULTIHANCE GADOBENATE DIMEGLUMINE 529 MG/ML IV SOLN  COMPARISON:  CT ABD/PELVIS W CM dated 02/19/2013; CT ABD/PELVIS W CM dated  08/14/2012; MR ABDOMEN WO/W CM dated 03/06/2010; CT ANGIO CHEST W/CM &/OR WO/CM dated 11/19/2013  FINDINGS: The previously demonstrated diffuse severe hepatic steatosis has improved. Medially within the right hepatic lobe at the site of the previous geographic sparing of steatosis is a new heterogeneous mass. This demonstrates peripheral heterogeneous enhancement following contrast, measuring approximately 5.6 x 3.7 cm transverse on image 23 of series 1201. There is inferior extension of tumor along the posteromedial hepatic capsule. There is extrinsic mass effect on the IVC which is narrowed, but patent. The hepatic and portal veins are patent. The dominant liver lesion demonstrates central cystic or necrotic components which do not enhance.  No other focal liver lesions are identified. There is stable mild intrahepatic biliary dilatation status post choledochojejunostomy. There is stable atrophy of the pancreatic body and tail. Soft tissue stranding and enhancement surrounding the duodenum and IVC are similar to the prior examination, likely postsurgical in etiology.  The spleen, adrenal glands and kidneys demonstrate no significant findings. There is no hydronephrosis. There is upper abdominal adenopathy within the portacaval space and retroperitoneum. There is a 1.4 cm aortic caval node which demonstrates central necrosis. There is a new enhancing 11 mm nodule inferior to the right hepatic lobe which may reflect a small peritoneal implant. No significant ascites is seen.  IMPRESSION: 1. As demonstrated on recent Chest CT, there is a new peripherally enhancing mass posteromedially within the right hepatic lobe, highly  worrisome for local recurrence or metastatic disease from the patient's bile duct adenocarcinoma. A liver abscess could have this appearance, although that possibility is considered much less likely. 2. Associated necrotic upper abdominal adenopathy, also worrisome for metastatic disease. Possible small  peritoneal implant adjacent to the right hepatic lobe. 3. No significant biliary dilatation. 4. Extrinsic mass effect on the IVC without evidence of occlusion.   Electronically Signed   By: Camie Patience M.D.   On: 11/25/2013 18:37   Ct Abdomen Pelvis W Contrast  12/03/2013   CLINICAL DATA:  History of Whipple procedure for bile duct carcinoma. New sepsis. New liver lesion  EXAM: CT ABDOMEN AND PELVIS WITH CONTRAST  TECHNIQUE: Multidetector CT imaging of the abdomen and pelvis was performed using the standard protocol following bolus administration of intravenous contrast.  CONTRAST:  142mL OMNIPAQUE IOHEXOL 300 MG/ML  SOLN  COMPARISON:  MR ABDOMEN WO/W CM dated 11/25/2013  FINDINGS: Lung bases are clear.  No pericardial fluid.  Again demonstrated a large subcapsular lesion within the posterior right hepatic lobe measuring 4.5 x 4.5 cm similar to 4.8 x 4.3 cm on comparison MRI. This lesion continues demonstrate mild peripheral enhancement and central heterogeneity. No additional hepatic lesions. There is pneumobilia in the left hepatic lobe consistent with choledochojejunostomy. There is some edema involving the bowel along the porta hepatis and small amount of fluid small of fluid along Morrison's pouch is similar prior.  Small nodule inferior right hepatic lobe measuring 8 mm unchanged (image 31, series 2 ). The body and tail of the pancreas normal. The spleen, adrenal glands, kidneys are normal. There is some cortical thinning on the right.  Small bowel and colon are unremarkable. There is no evidence of abscess in the pelvis.  Post hysterectomy anatomy. The bladder is normal. No pelvic lymphadenopathy. No aggressive osseous lesion.  IMPRESSION: 1. No change in large enhancing lesion the posterior aspect of the right hepatic lobe. In patient with sepsis of unknown origin would consider hepatic abscess as a distinct possibility although adenocarcinoma recurrence cannot be excluded. Recommend ultrasound guided  percutaneous biopsy of this lesion to differentiate between abscess and malignancy. Favor abscess. 2. Edema along the proximal bowel and Morrison's pouch likely related to the hepatic process. 3. Patient status post a Whipple procedure without complicating features. This was made a call report.   Electronically Signed   By: Suzy Bouchard M.D.   On: 12/03/2013 20:18   US Biopsy  12/10/2013   CLINICAL DATA:  Right lobe liver lesion. 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 Request has been made to obtain more tissue for areas culture studies.  EXAM: ULTRASOUND GUIDED ASPIRATE AND CORE BIOPSY OF LIVER  MEDICATIONS: 2.0 mg IV Versed; 100 mcg IV Fentanyl  Total Moderate Sedation Time: 25 min  PROCEDURE: The procedure, risks, benefits, and alternatives were explained to the patient. Questions regarding the procedure were encouraged and answered. The patient understands and consents to the procedure.  The right abdominal wall was prepped with chlorhexidine in a sterile fashion, and a sterile drape was applied covering the operative field. A sterile gown and sterile gloves were used for the procedure. Local anesthesia was provided with 1% Lidocaine.  Under CT guidance, a 19 gauge trocar needle was advanced to the level of the posterior right hepatic lesion previously sampled. Both 22 gauge and 20 gauge Chiba needle aspirate samples were obtained. A total of 3 FNA samples were combined with sterile saline. Two separate 20 gauge core biopsy  samples were also obtained and combined with the aspirate samples in sterile saline. The outer needle was then removed.  COMPLICATIONS: None.  FINDINGS: Multiple FNA and core biopsy samples were combined, as above for a variety of requested culture studies.  IMPRESSION: Ultrasound-guided needle aspirate and core biopsy of right hepatic lesion for culture studies.   Electronically Signed   By: Aletta Edouard M.D.   On: 12/10/2013 17:43   US  Biopsy  12/04/2013   CLINICAL DATA:  58 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 Patient has an elevated white count and chills. Evaluate for recurrent cancer versus abscess.  EXAM: ULTRASOUND GUIDED BIOPSY OF THE LIVER LESION  MEDICATIONS: 2.0 mg IV Versed; 100 mcg IV Fentanyl. A radiology nurse monitored the patient throughout the procedure.  Total Moderate Sedation Time: 25 min  PROCEDURE: The procedure, risks, benefits, and alternatives were explained to the patient. Questions regarding the procedure were encouraged and answered. The patient understands and consents to the procedure.  The right upper abdomen was prepped with chlorhexidine. A sterile drape was placed. The skin and liver capsule were anesthetized with 1% lidocaine. A 17 gauge needle was directed into the posterior right hepatic lesion and tried to aspirate fluid from the 17 gauge needle. No purulent fluid was obtained. A small amount of bloody fluid was collected. Three core biopsies were obtained with an 18 gauge device. Specimens were placed in formalin. Needle removed without complication.  COMPLICATIONS: None.  FINDINGS: Heterogeneous hypoechoic lesion in the posterior right hepatic lobe. Needle placement was confirmed within the lesion. Following the first core biopsy, there was development of multiple echogenic foci throughout the lesion. Echogenic foci probably represent gas from the biliary system. No significant bleeding.  IMPRESSION: Ultrasound-guided core biopsies of the liver lesion. Solid core material was obtained from the biopsies and no purulent fluid could be obtained. Findings are suspicious for recurrent cancer rather than abscess. A sample of the aspirated blood was sent for Gram stain and culture.   Electronically Signed   By: Markus Daft M.D.   On: 12/04/2013 16:36         Subjective: Patient denies fevers, chills, chest pain, shortness breath, nausea, vomiting, diarrhea, abdominal pain,  dysuria, hematuria. No rashes. No headache today. No visual disturbance.  Objective: Filed Vitals:   12/10/13 1610 12/10/13 2120 12/11/13 0640 12/11/13 1341  BP: 118/64 104/74 115/87 123/69  Pulse: 95 80 66 108  Temp:  98.2 F (36.8 C) 97.5 F (36.4 C) 97.8 F (36.6 C)  TempSrc:  Oral Oral Oral  Resp: 20 20 20 22   Height:      Weight:      SpO2: 95% 100% 100% 98%    Intake/Output Summary (Last 24 hours) at 12/11/13 1907 Last data filed at 12/11/13 1811  Gross per 24 hour  Intake 3568.33 ml  Output   3600 ml  Net -31.67 ml   Weight change:  Exam:   General:  Pt is alert, follows commands appropriately, not in acute distress  HEENT: No icterus, No thrush,  [place]/AT  Cardiovascular: RRR, S1/S2, no rubs, no gallops  Respiratory: CTA bilaterally, no wheezing, no crackles, no rhonchi  Abdomen: Soft/+BS, RUQ tender without rebound, non distended, no guarding  Extremities: trace LE edema, No lymphangitis, No petechiae, No rashes, no synovitis  Data Reviewed: Basic Metabolic Panel:  Recent Labs Lab 12/06/13 0518 12/07/13 0440 12/08/13 0640 12/09/13 0540 12/11/13 0449  NA 139 140 135* 140 140  K  3.6* 4.4 4.0 3.6* 3.4*  CL 99 101 100 104 102  CO2 28 28 25 27 29   GLUCOSE 183* 166* 197* 151* 156*  BUN 12 10 7 7 11   CREATININE 0.90 0.90 0.85 0.92 0.86  CALCIUM 8.4 9.0 8.3* 8.3* 8.5  MG  --   --   --   --  2.2   Liver Function Tests:  Recent Labs Lab 12/05/13 0540  AST 19  ALT 15  ALKPHOS 123*  BILITOT 0.3  PROT 6.2  ALBUMIN 2.4*   No results found for this basename: LIPASE, AMYLASE,  in the last 168 hours No results found for this basename: AMMONIA,  in the last 168 hours CBC:  Recent Labs Lab 12/05/13 0540 12/06/13 0518 12/07/13 0440 12/08/13 0640 12/09/13 0540 12/11/13 0449  WBC 13.7* 6.5 5.9 11.4* 6.4 7.1  NEUTROABS 12.0*  --   --   --   --   --   HGB 10.8* 10.4* 11.2* 10.7* 9.6* 9.7*  HCT 33.7* 32.6* 34.7* 33.0* 31.0* 30.6*  MCV 88.9 88.8  87.8 87.5 89.9 89.7  PLT 275 261 301 265 268 281   Cardiac Enzymes: No results found for this basename: CKTOTAL, CKMB, CKMBINDEX, TROPONINI,  in the last 168 hours BNP: No components found with this basename: POCBNP,  CBG:  Recent Labs Lab 12/10/13 1708 12/10/13 2112 12/11/13 0756 12/11/13 1129 12/11/13 1630  GLUCAP 300* 258* 142* 220* 149*    Recent Results (from the past 240 hour(s))  CULTURE, BLOOD (ROUTINE X 2)     Status: None   Collection Time    12/03/13  1:15 PM      Result Value Range Status   Specimen Description BLOOD RIGHT FOREARM  5 ML IN [facility] BOTTLE   Final   Special Requests NONE   Final   Culture  Setup Time     Final   Value: 12/03/2013 19:43     Performed at [facility]   Culture     Final   Value: NO GROWTH 5 DAYS     Performed at [facility]   Report Status 12/09/2013 FINAL   Final  CULTURE, BLOOD (ROUTINE X 2)     Status: None   Collection Time    12/03/13  2:09 PM      Result Value Range Status   Specimen Description BLOOD RIGHT FOREARM  5 ML IN [facility] BOTTLE   Final   Special Requests NONE   Final   Culture  Setup Time     Final   Value: 12/03/2013 19:43     Performed at [facility]   Culture     Final   Value: NO GROWTH 5 DAYS     Performed at [facility]   Report Status 12/09/2013 FINAL   Final  CULTURE, ROUTINE-ABSCESS     Status: None   Collection Time    12/04/13  4:19 PM      Result Value Range Status   Specimen Description LIVER   Final   Special Requests NONE   Final   Gram Stain     Final   Value: NO WBC SEEN     NO SQUAMOUS EPITHELIAL CELLS SEEN     NO ORGANISMS SEEN     Performed at [facility]   Culture     Final   Value: NO GROWTH 2 DAYS     Performed at [facility]   Report Status 12/08/2013 FINAL  Final  CULTURE, EXPECTORATED SPUTUM-ASSESSMENT     Status: None   Collection Time    12/08/13 12:28 PM      Result Value Range Status   Specimen Description SPUTUM    Final   Special Requests NONE   Final   Sputum evaluation     Final   Value: MICROSCOPIC FINDINGS SUGGEST THAT THIS SPECIMEN IS NOT REPRESENTATIVE OF LOWER RESPIRATORY SECRETIONS. PLEASE RECOLLECT.     INFORMED DANA RN AT 1240 ON 01.21.15 BY SHUEA   Report Status 12/08/2013 FINAL   Final  CULTURE, EXPECTORATED SPUTUM-ASSESSMENT     Status: None   Collection Time    12/09/13  7:16 AM      Result Value Range Status   Specimen Description SPUTUM   Final   Special Requests Normal   Final   Sputum evaluation     Final   Value: MICROSCOPIC FINDINGS SUGGEST THAT THIS SPECIMEN IS NOT REPRESENTATIVE OF LOWER RESPIRATORY SECRETIONS. PLEASE RECOLLECT.     INFORMED DANDA DARKE RN AT 0740 ON 01.22.15 BY SHUEA   Report Status 12/09/2013 FINAL   Final  FUNGUS CULTURE, BLOOD     Status: None   Collection Time    12/10/13  5:00 AM      Result Value Range Status   Specimen Description BLOOD PICC   Final   Special Requests 5CC   Final   Culture     Final   Value: NO FUNGUS ISOLATED;CULTURE IN PROGRESS FOR 7 DAYS     Performed at [facility]   Report Status PENDING   Incomplete  FUNGUS CULTURE W SMEAR     Status: None   Collection Time    12/10/13  2:13 PM      Result Value Range Status   Specimen Description LIVER   Final   Special Requests NONE   Final   Fungal Smear     Final   Value: NO YEAST OR FUNGAL ELEMENTS SEEN     Performed at [facility]   Culture     Final   Value: CULTURE IN PROGRESS FOR FOUR WEEKS     Performed at [facility]   Report Status PENDING   Incomplete  TISSUE CULTURE     Status: None   Collection Time    12/10/13  2:19 PM      Result Value Range Status   Specimen Description OTHER LIVER    Final   Special Requests Normal   Final   Gram Stain     Final   Value: RARE WBC PRESENT,BOTH PMN AND MONONUCLEAR     NO SQUAMOUS EPITHELIAL CELLS SEEN     NO ORGANISMS SEEN     Performed at [facility]   Culture PENDING   Incomplete    Report Status PENDING   Incomplete  AFB CULTURE WITH SMEAR     Status: None   Collection Time    12/10/13  2:19 PM      Result Value Range Status   Specimen Description ABSCESS TISSUE LIVER   Final   Special Requests Normal   Final   ACID FAST SMEAR     Final   Value: NO ACID FAST BACILLI SEEN     Performed at [facility]   Culture     Final   Value: CULTURE WILL BE EXAMINED FOR 6 WEEKS BEFORE ISSUING A FINAL REPORT     Performed at [facility]   Report  Status PENDING   Incomplete  ANAEROBIC CULTURE     Status: None   Collection Time    12/10/13  2:19 PM      Result Value Range Status   Specimen Description LIVER   Final   Special Requests Normal   Final   Gram Stain     Final   Value: RARE WBC PRESENT,BOTH PMN AND MONONUCLEAR     NO SQUAMOUS EPITHELIAL CELLS SEEN     NO ORGANISMS SEEN     Performed at [facility]   Culture     Final   Value: NO ANAEROBES ISOLATED; CULTURE IN PROGRESS FOR 5 DAYS     Performed at [facility]   Report Status PENDING   Incomplete  AFB CULTURE, BLOOD     Status: None   Collection Time    12/11/13  4:49 AM      Result Value Range Status   Specimen Description BLOOD RIGHT ARM PICC   Final   Special Requests ACID FAST BACILLI [place]   Final   Culture     Final   Value: CULTURE WILL BE EXAMINED FOR 6 WEEKS BEFORE ISSUING A FINAL REPORT     Performed at [facility]   Report Status PENDING   Incomplete     Scheduled Meds: . calcium-vitamin D  1 tablet Oral TID WC  . enoxaparin (LOVENOX) injection  40 mg Subcutaneous Q2200  . furosemide  40 mg Oral BID  . insulin aspart  0-15 Units Subcutaneous TID WC  . insulin aspart  4 Units Subcutaneous TID WC  . insulin glargine  5 Units Subcutaneous QHS  . iron polysaccharides  150 mg Oral Daily  . levothyroxine  100 mcg Oral Q48H  . levothyroxine  200 mcg Oral Q48H  . lipase/protease/amylase  1 capsule Oral TID AC  . loratadine  10 mg Oral Daily  . magnesium  oxide  200 mg Oral TID PC & HS  . multivitamin with minerals  1 tablet Oral Daily  . pantoprazole  40 mg Oral Daily  . potassium chloride SA  20 mEq Oral BID  . sodium chloride  3 mL Intravenous Q12H  . Vitamin D (Ergocalciferol)  50,000 Units Oral QPM   Continuous Infusions: . sodium chloride 100 mL/hr at 12/11/13 0109     Dreux Mcgroarty, DO  [facility] Pager [phone]  If 7PM-7AM, please contact night-coverage [email or web address] Password [email or web address] 12/11/2013, 7:07 PM   LOS: 8 days

## 2013-12-11 NOTE — Progress Notes (Signed)
Patient ID: Courtney Stevens, female   DOB: Jul 31, 1956, 58 y.o.   MRN: [id number]         [facility]    Date of Admission:  12/03/2013       Principal Problem:   SIRS (systemic inflammatory response syndrome) Active Problems:   Malignant neoplasm of extrahepatic bile ducts   DM   PANCREATIC INSUFFICIENCY   Unspecified essential hypertension   Intrinsic asthma   History of Roux-en-Y gastric bypass   Fever   Hypothyroidism   Lactic acidosis   Sepsis   Objective: Temp:  [97.5 F (36.4 C)-103.1 F (39.5 C)] 97.5 F (36.4 C) (01/24 0640) Pulse Rate:  [66-121] 66 (01/24 0640) Resp:  [14-22] 20 (01/24 0640) BP: (103-132)/(57-87) 115/87 mmHg (01/24 0640) SpO2:  [92 %-100 %] 100 % (01/24 0640)  Lab Results Lab Results  Component Value Date   WBC 7.1 12/11/2013   HGB 9.7* 12/11/2013   HCT 30.6* 12/11/2013   MCV 89.7 12/11/2013   PLT 281 12/11/2013    Lab Results  Component Value Date   CREATININE 0.86 12/11/2013   BUN 11 12/11/2013   NA 140 12/11/2013   K 3.4* 12/11/2013   CL 102 12/11/2013   CO2 29 12/11/2013    Lab Results  Component Value Date   ALT 15 12/05/2013   AST 19 12/05/2013   ALKPHOS 123* 12/05/2013   BILITOT 0.3 12/05/2013      Microbiology: Recent Results (from the past 240 hour(s))  CULTURE, BLOOD (ROUTINE X 2)     Status: None   Collection Time    12/03/13  1:15 PM      Result Value Range Status   Specimen Description BLOOD RIGHT FOREARM  5 ML IN [facility] BOTTLE   Final   Special Requests NONE   Final   Culture  Setup Time     Final   Value: 12/03/2013 19:43     Performed at [facility]   Culture     Final   Value: NO GROWTH 5 DAYS     Performed at [facility]   Report Status 12/09/2013 FINAL   Final  CULTURE, BLOOD (ROUTINE X 2)     Status: None   Collection Time    12/03/13  2:09 PM      Result Value Range Status   Specimen Description BLOOD RIGHT FOREARM  5 ML IN [facility] BOTTLE   Final   Special Requests NONE    Final   Culture  Setup Time     Final   Value: 12/03/2013 19:43     Performed at [facility]   Culture     Final   Value: NO GROWTH 5 DAYS     Performed at [facility]   Report Status 12/09/2013 FINAL   Final  CULTURE, ROUTINE-ABSCESS     Status: None   Collection Time    12/04/13  4:19 PM      Result Value Range Status   Specimen Description LIVER   Final   Special Requests NONE   Final   Gram Stain     Final   Value: NO WBC SEEN     NO SQUAMOUS EPITHELIAL CELLS SEEN     NO ORGANISMS SEEN     Performed at [facility]   Culture     Final   Value: NO GROWTH 2 DAYS     Performed at [facility]  Status 12/08/2013 FINAL   Final  CULTURE, EXPECTORATED SPUTUM-ASSESSMENT     Status: None   Collection Time    12/08/13 12:28 PM      Result Value Range Status   Specimen Description SPUTUM   Final   Special Requests NONE   Final   Sputum evaluation     Final   Value: MICROSCOPIC FINDINGS SUGGEST THAT THIS SPECIMEN IS NOT REPRESENTATIVE OF LOWER RESPIRATORY SECRETIONS. PLEASE RECOLLECT.     INFORMED DANA RN AT 0093 ON 01.21.15 BY SHUEA   Report Status 12/08/2013 FINAL   Final  CULTURE, EXPECTORATED SPUTUM-ASSESSMENT     Status: None   Collection Time    12/09/13  7:16 AM      Result Value Range Status   Specimen Description SPUTUM   Final   Special Requests Normal   Final   Sputum evaluation     Final   Value: MICROSCOPIC FINDINGS SUGGEST THAT THIS SPECIMEN IS NOT REPRESENTATIVE OF LOWER RESPIRATORY SECRETIONS. PLEASE RECOLLECT.     INFORMED DANDA DARKE RN AT [id number] ON 01.22.15 BY SHUEA   Report Status 12/09/2013 FINAL   Final  TISSUE CULTURE     Status: None   Collection Time    12/10/13  2:19 PM      Result Value Range Status   Specimen Description OTHER LIVER    Final   Special Requests Normal   Final   Gram Stain     Final   Value: RARE WBC PRESENT,BOTH PMN AND MONONUCLEAR     NO SQUAMOUS EPITHELIAL CELLS SEEN     NO ORGANISMS  SEEN     Performed at [facility]   Culture PENDING   Incomplete   Report Status PENDING   Incomplete  ANAEROBIC CULTURE     Status: None   Collection Time    12/10/13  2:19 PM      Result Value Range Status   Specimen Description LIVER   Final   Special Requests Normal   Final   Gram Stain     Final   Value: RARE WBC PRESENT,BOTH PMN AND MONONUCLEAR     NO SQUAMOUS EPITHELIAL CELLS SEEN     NO ORGANISMS SEEN     Performed at [facility]   Culture     Final   Value: NO ANAEROBES ISOLATED; CULTURE IN PROGRESS FOR 5 DAYS     Performed at [facility]   Report Status PENDING   Incomplete    Studies/Results: US Biopsy  12/10/2013   CLINICAL DATA:  Right lobe liver lesion. 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 Request has been made to obtain more tissue for areas culture studies.  EXAM: ULTRASOUND GUIDED ASPIRATE AND CORE BIOPSY OF LIVER  MEDICATIONS: 2.0 mg IV Versed; 100 mcg IV Fentanyl  Total Moderate Sedation Time: 25 min  PROCEDURE: The procedure, risks, benefits, and alternatives were explained to the patient. Questions regarding the procedure were encouraged and answered. The patient understands and consents to the procedure.  The right abdominal wall was prepped with chlorhexidine in a sterile fashion, and a sterile drape was applied covering the operative field. A sterile gown and sterile gloves were used for the procedure. Local anesthesia was provided with 1% Lidocaine.  Under CT guidance, a 19 gauge trocar needle was advanced to the level of the posterior right hepatic lesion previously sampled. Both 22 gauge and 20 gauge Chiba needle aspirate samples were obtained. A total of 3 FNA  samples were combined with sterile saline. Two separate 20 gauge core biopsy samples were also obtained and combined with the aspirate samples in sterile saline. The outer needle was then removed.  COMPLICATIONS: None.  FINDINGS: Multiple FNA  and core biopsy samples were combined, as above for a variety of requested culture studies.  IMPRESSION: Ultrasound-guided needle aspirate and core biopsy of right hepatic lesion for culture studies.   Electronically Signed   By: Aletta Edouard M.D.   On: 12/10/2013 17:43    Assessment: Gram stain of liver biopsy revealed no organisms. All other stains and cultures are pending.  Plan: 1. Continue observation off antibiotics pending final results of liver biopsy  Michel Bickers, MD [facility] [place] [phone] pager   [phone] cell 12/11/2013, 10:56 AM

## 2013-12-12 DIAGNOSIS — I10 Essential (primary) hypertension: Secondary | ICD-10-CM

## 2013-12-12 LAB — GLUCOSE, CAPILLARY
Glucose-Capillary: 129 mg/dL — ABNORMAL HIGH (ref 70–99)
Glucose-Capillary: 121 mg/dL — ABNORMAL HIGH (ref 70–99)
Glucose-Capillary: 191 mg/dL — ABNORMAL HIGH (ref 70–99)
Glucose-Capillary: 186 mg/dL — ABNORMAL HIGH (ref 70–99)

## 2013-12-12 NOTE — Progress Notes (Signed)
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 Willette Brace liver biopsy (12/10/13) with fungal and AFB stains and culture  -fungal, brucella, coxiella serologies sent; QuantiFERON-TB  -agree with d/c antibacterials  -case discussed with IR, Dr. 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 Birdie Sons at [facility] on 02/22/2008.  -radical resection for node-positive adenocarcinoma of the bile duct with positive lymph node involvement (2 out of 17 lymph nodes) with positive surgical margins.  -appreciated Dr. Juliann Mule followup  DM2  -12/07/2013 hemoglobin A1c 7.9  -Continue Humalog sliding scale  -increase NovoLog 6 units before each meal  -start Lantus 5 units  Pancreatic insufficiency  -Continue Creon  -Patient is eating well  Hypothyroidism  -Continue Synthroid  Anemia of chronic disease  -Hemoglobin is stable  Family Communication: Pt at beside  Disposition Plan: Home when medically stable         Procedures/Studies: Dg Chest 2 View  12/03/2013   CLINICAL  DATA:  Fever, cough.  EXAM: CHEST  2 VIEW  COMPARISON:  November 25, 2013.  FINDINGS: The heart size and mediastinal contours are within normal limits. Both lungs are clear. No pleural effusion or pneumothorax is noted. The visualized skeletal structures are unremarkable.  IMPRESSION: No active cardiopulmonary disease.   Electronically Signed   By: [name] M.D.   On: 12/03/2013 14:14   Dg Chest 2 View  11/25/2013   CLINICAL DATA:  Mental status change, fever and chills, history of biliary tract cancer  EXAM: CHEST  2 VIEW  COMPARISON:  CT scan of this thorax dated November 19, 2013 and chest x-ray of the same day.  FINDINGS: The lungs are adequately inflated. There is no focal infiltrate. There is mild symmetric prominence of the pulmonary interstitial markings bilaterally. There is no pleural effusion. The cardiopericardial silhouette is normal in size. The pulmonary vascularity is not engorged. The mediastinum is normal in width very there is no pleural effusion. The observed portions of the bony thorax exhibit no acute abnormalities. There are surgical clips in the left upper quadrant of the abdomen.  IMPRESSION: There is no evidence of pneumonia nor CHF nor other acute cardiopulmonary abnormality. Mild prominence of the pulmonary interstitial markings bilaterally may be related to the patient's previous smoking history.   Electronically Signed   By: [name]   On: 11/25/2013 16:18   Dg Chest 2 View  11/19/2013   CLINICAL DATA:  Shortness of breath and headache.  EXAM: CHEST  2 VIEW  COMPARISON:  03/02/2010  FINDINGS: Lungs are adequately inflated without consolidation or effusion. Cardiomediastinal silhouette is within normal. There is mild spondylosis of the spine. Multiple surgical clips over the abdomen.  IMPRESSION: No active cardiopulmonary disease.   Electronically Signed   By: [name] M.D.   On: 11/19/2013 13:25   Ct Head Wo Contrast  11/19/2013   CLINICAL DATA:  Headache, no known  injury; history of adenocarcinoma of the bile duct  EXAM: CT HEAD WITHOUT CONTRAST  TECHNIQUE: Contiguous axial images were obtained from the base of the skull through the vertex without intravenous contrast.  COMPARISON:  PET-CT -06/12/2009  FINDINGS: Gray-white differentiation is maintained. Incidental note is made of prominent VR spaces within the caudal medial aspects of the bilateral temporal lobes. No CT evidence for acute large territory infarct. No intraparenchymal or extra-axial mass or hemorrhage. Normal size and configuration of the ventricles and basilar cisterns. No midline. 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 The remaining paranasal sinuses mastoid air cells appear normally aerated. Regional soft tissues appear normal. No displaced calvarial fracture.  IMPRESSION: Negative noncontrast head CT.   Electronically Signed   By: [name] M.D.   On: 11/19/2013 12:58   Ct Angio Chest Pe W/cm &/or Wo Cm  11/19/2013   CLINICAL DATA:  Shortness of breath, chest pain.  EXAM: CT ANGIOGRAPHY CHEST WITH CONTRAST  TECHNIQUE: Multidetector CT imaging of the chest was performed using the standard protocol during bolus administration of intravenous contrast. Multiplanar CT image reconstructions including MIPs were obtained to evaluate the vascular anatomy.  CONTRAST:  184mL OMNIPAQUE IOHEXOL 350 MG/ML SOLN  COMPARISON:  CT scan of Apr 14, 2013.  FINDINGS: No pleural effusion or pneumothorax is noted. No significant pulmonary parenchymal abnormality is noted. The thoracic aorta appears normal without evidence of dissection or aneurysm. Pulmonary arteries appear normal. There is no evidence of pulmonary embolus no significant mediastinal adenopathy is noted. Ill-defined low-density measuring 4 cm is seen in the posterior segment of the right hepatic lobe ; further evaluation with MRI is recommended. No osseous abnormality  is noted in the chest  Review of the MIP images confirms the above findings.  IMPRESSION: No evidence of pulmonary embolus. No acute pulmonary disease is noted. 4 cm ill-defined low density is noted in the posterior segment of the right hepatic lobe concerning for possible neoplasm ; further evaluation with MRI is recommended.   Electronically Signed   By: [name] M.D.   On: 11/19/2013 14:58   Mr Jeri Cos [id number] Contrast  12/06/2013   CLINICAL DATA:  Bile duct cancer post resection 2009. Fever. Headaches. Diabetic hypertensive hyperlipidemia patient.  EXAM: MRI HEAD WITHOUT AND WITH CONTRAST  TECHNIQUE: Multiplanar, multiecho pulse sequences of the brain and surrounding structures were obtained without and with intravenous contrast.  CONTRAST:  14 cc MultiHance.  COMPARISON:  11/19/2013 CT.  No comparison brain MR.  FINDINGS: No acute infarct.  No intracranial hemorrhage.  Minimal nonspecific white matter type changes without MR evidence of intracranial infection. If this is of high clinical concern, correlation with cerebral spinal fluid analysis may then be considered.  Prominent peri vascular spaces versus remote basal ganglia lacune infarcts.  No intracranial mass or abnormal enhancement.  Major intracranial vascular structures are patent.  Cervical medullary junction, pituitary region, pineal region and orbital structures unremarkable.  Minimal mucosal thickening inferior aspect right maxillary sinus.  IMPRESSION: No acute infarct.  Minimal nonspecific white matter type changes without MR evidence of intracranial infection. If this is of high clinical concern, correlation with cerebral spinal fluid analysis may then be considered.  Prominent peri vascular spaces versus remote basal ganglia lacune infarcts.  No  intracranial mass or abnormal enhancement.   Electronically Signed   By: Chauncey Cruel M.D.   On: 12/06/2013 17:08   Mr Lumbar Spine W Wo Contrast  12/06/2013   CLINICAL DATA:  Bile duct cancer.  Fever. Back pain. Query abscess/ infection.  EXAM: MRI LUMBAR SPINE WITHOUT AND WITH CONTRAST  TECHNIQUE: Multiplanar and multiecho pulse sequences of the lumbar spine were obtained without and with intravenous contrast.  CONTRAST:  91mL MULTIHANCE GADOBENATE DIMEGLUMINE 529 MG/ML IV SOLN  COMPARISON:  MR ABDOMEN WO/W CM dated 11/25/2013; CT ABD/PELVIS W CM dated 12/03/2013  FINDINGS: The lowest lumbar type non-rib-bearing vertebra is labeled as L5. The conus medullaris appears normal. Conus level: L1-2.  No vertebral subluxation is observed. Intervertebral disc desiccation is observed at L4-5. No significant abnormal lumbar vertebral edema or vertebral enhancement identified.  Right kidney upper pole atrophy/scarring noted. No findings of discitis -osteomyelitis. No significant abnormal epidural process. No clumping of nerve roots to suggest arachnoiditis.  Despite efforts by the technologist and patient, motion artifact is present on today's exam and could not be eliminated. This reduces exam sensitivity and specificity. Additional findings at individual levels are as follows:  L1-2:  Unremarkable.  L2-3:  No impingement.  Left facet arthropathy.  L3-4:  Unremarkable.  L4-5: No impingement. Minimal disc bulge with right foraminal annular tear.  L5-S1:  No impingement.  Right greater than left facet arthropathy.  IMPRESSION: 1. No lumbar impingement or findings suspicious for infection or malignancy involving the lumbar spine. 2. Mild lumbar spondylosis and degenerative disc disease.   Electronically Signed   By: Sherryl Barters M.D.   On: 12/06/2013 16:55   Mr Abdomen W Wo Contrast  11/25/2013   CLINICAL DATA:  Abdominal pain with nausea, vomiting and fever. 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 Liver lesion on CT.  EXAM: MRI ABDOMEN WITHOUT AND WITH CONTRAST  TECHNIQUE: Multiplanar multisequence MR imaging of the abdomen was performed both before and after the administration of  intravenous contrast.  CONTRAST:  59mL MULTIHANCE GADOBENATE DIMEGLUMINE 529 MG/ML IV SOLN  COMPARISON:  CT ABD/PELVIS W CM dated 02/19/2013; CT ABD/PELVIS W CM dated 08/14/2012; MR ABDOMEN WO/W CM dated 03/06/2010; CT ANGIO CHEST W/CM &/OR WO/CM dated 11/19/2013  FINDINGS: The previously demonstrated diffuse severe hepatic steatosis has improved. Medially within the right hepatic lobe at the site of the previous geographic sparing of steatosis is a new heterogeneous mass. This demonstrates peripheral heterogeneous enhancement following contrast, measuring approximately 5.6 x 3.7 cm transverse on image 23 of series 1201. There is inferior extension of tumor along the posteromedial hepatic capsule. There is extrinsic mass effect on the IVC which is narrowed, but patent. The hepatic and portal veins are patent. The dominant liver lesion demonstrates central cystic or necrotic components which do not enhance.  No other focal liver lesions are identified. There is stable mild intrahepatic biliary dilatation status post choledochojejunostomy. There is stable atrophy of the pancreatic body and tail. Soft tissue stranding and enhancement surrounding the duodenum and IVC are similar to the prior examination, likely postsurgical in etiology.  The spleen, adrenal glands and kidneys demonstrate no significant findings. There is no hydronephrosis. There is upper abdominal adenopathy within the portacaval space and retroperitoneum. There is a 1.4 cm aortic caval node which demonstrates central necrosis. There is a new enhancing 11 mm nodule inferior to the right hepatic lobe which may reflect a small peritoneal implant. No significant ascites is seen.  IMPRESSION: 1. As demonstrated  on recent Chest CT, there is a new peripherally enhancing mass posteromedially within the right hepatic lobe, highly worrisome for local recurrence or metastatic disease from the patient's bile duct adenocarcinoma. A liver abscess could have this  appearance, although that possibility is considered much less likely. 2. Associated necrotic upper abdominal adenopathy, also worrisome for metastatic disease. Possible small peritoneal implant adjacent to the right hepatic lobe. 3. No significant biliary dilatation. 4. Extrinsic mass effect on the IVC without evidence of occlusion.   Electronically Signed   By: Camie Patience M.D.   On: 11/25/2013 18:37   Ct Abdomen Pelvis W Contrast  12/03/2013   CLINICAL DATA:  History of Whipple procedure for bile duct carcinoma. New sepsis. New liver lesion  EXAM: CT ABDOMEN AND PELVIS WITH CONTRAST  TECHNIQUE: Multidetector CT imaging of the abdomen and pelvis was performed using the standard protocol following bolus administration of intravenous contrast.  CONTRAST:  138mL OMNIPAQUE IOHEXOL 300 MG/ML  SOLN  COMPARISON:  MR ABDOMEN WO/W CM dated 11/25/2013  FINDINGS: Lung bases are clear.  No pericardial fluid.  Again demonstrated a large subcapsular lesion within the posterior right hepatic lobe measuring 4.5 x 4.5 cm similar to 4.8 x 4.3 cm on comparison MRI. This lesion continues demonstrate mild peripheral enhancement and central heterogeneity. No additional hepatic lesions. There is pneumobilia in the left hepatic lobe consistent with choledochojejunostomy. There is some edema involving the bowel along the porta hepatis and small amount of fluid small of fluid along Morrison's pouch is similar prior.  Small nodule inferior right hepatic lobe measuring 8 mm unchanged (image 31, series 2 ). The body and tail of the pancreas normal. The spleen, adrenal glands, kidneys are normal. There is some cortical thinning on the right.  Small bowel and colon are unremarkable. There is no evidence of abscess in the pelvis.  Post hysterectomy anatomy. The bladder is normal. No pelvic lymphadenopathy. No aggressive osseous lesion.  IMPRESSION: 1. No change in large enhancing lesion the posterior aspect of the right hepatic lobe. In patient  with sepsis of unknown origin would consider hepatic abscess as a distinct possibility although adenocarcinoma recurrence cannot be excluded. Recommend ultrasound guided percutaneous biopsy of this lesion to differentiate between abscess and malignancy. Favor abscess. 2. Edema along the proximal bowel and Morrison's pouch likely related to the hepatic process. 3. Patient status post a Whipple procedure without complicating features. This was made a call report.   Electronically Signed   By: Suzy Bouchard M.D.   On: 12/03/2013 20:18   US Biopsy  12/10/2013   CLINICAL DATA:  Right lobe liver lesion. 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 Request has been made to obtain more tissue for areas culture studies.  EXAM: ULTRASOUND GUIDED ASPIRATE AND CORE BIOPSY OF LIVER  MEDICATIONS: 2.0 mg IV Versed; 100 mcg IV Fentanyl  Total Moderate Sedation Time: 25 min  PROCEDURE: The procedure, risks, benefits, and alternatives were explained to the patient. Questions regarding the procedure were encouraged and answered. The patient understands and consents to the procedure.  The right abdominal wall was prepped with chlorhexidine in a sterile fashion, and a sterile drape was applied covering the operative field. A sterile gown and sterile gloves were used for the procedure. Local anesthesia was provided with 1% Lidocaine.  Under CT guidance, a 19 gauge trocar needle was advanced to the level of the posterior right hepatic lesion previously sampled. Both 22 gauge and 20 gauge Chiba needle aspirate samples were  obtained. A total of 3 FNA samples were combined with sterile saline. Two separate 20 gauge core biopsy samples were also obtained and combined with the aspirate samples in sterile saline. The outer needle was then removed.  COMPLICATIONS: None.  FINDINGS: Multiple FNA and core biopsy samples were combined, as above for a variety of requested culture studies.  IMPRESSION:  Ultrasound-guided needle aspirate and core biopsy of right hepatic lesion for culture studies.   Electronically Signed   By: Aletta Edouard M.D.   On: 12/10/2013 17:43   US Biopsy  12/04/2013   CLINICAL DATA:  58 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 Patient has an elevated white count and chills. Evaluate for recurrent cancer versus abscess.  EXAM: ULTRASOUND GUIDED BIOPSY OF THE LIVER LESION  MEDICATIONS: 2.0 mg IV Versed; 100 mcg IV Fentanyl. A radiology nurse monitored the patient throughout the procedure.  Total Moderate Sedation Time: 25 min  PROCEDURE: The procedure, risks, benefits, and alternatives were explained to the patient. Questions regarding the procedure were encouraged and answered. The patient understands and consents to the procedure.  The right upper abdomen was prepped with chlorhexidine. A sterile drape was placed. The skin and liver capsule were anesthetized with 1% lidocaine. A 17 gauge needle was directed into the posterior right hepatic lesion and tried to aspirate fluid from the 17 gauge needle. No purulent fluid was obtained. A small amount of bloody fluid was collected. Three core biopsies were obtained with an 18 gauge device. Specimens were placed in formalin. Needle removed without complication.  COMPLICATIONS: None.  FINDINGS: Heterogeneous hypoechoic lesion in the posterior right hepatic lobe. Needle placement was confirmed within the lesion. Following the first core biopsy, there was development of multiple echogenic foci throughout the lesion. Echogenic foci probably represent gas from the biliary system. No significant bleeding.  IMPRESSION: Ultrasound-guided core biopsies of the liver lesion. Solid core material was obtained from the biopsies and no purulent fluid could be obtained. Findings are suspicious for recurrent cancer rather than abscess. A sample of the aspirated blood was sent for Gram stain and culture.   Electronically Signed   By:  Markus Daft M.D.   On: 12/04/2013 16:36         Subjective: Patient had another fever today. She has a headache when she has fevers. Denies any visual disturbance. Denies any chest pain, shortness breath, hemoptysis, nausea, vomiting, diarrhea,  dysuria. She has her usual right upper quadrant pain which is unchanged.  Objective: Filed Vitals:   12/11/13 1341 12/11/13 2224 12/12/13 0519 12/12/13 1313  BP: 123/69 129/60 112/60 133/75  Pulse: 108 109 83 104  Temp: 97.8 F (36.6 C) 98 F (36.7 C) 99.3 F (37.4 C) 99.4 F (37.4 C)  TempSrc: Oral Oral Oral Oral  Resp: 22 20 18 20   Height:      Weight:      SpO2: 98% 100% 93% 97%    Intake/Output Summary (Last 24 hours) at 12/12/13 1621 Last data filed at 12/12/13 1610  Gross per 24 hour  Intake 3048.33 ml  Output   5901 ml  Net -2852.67 ml   Weight change:  Exam:   General:  Pt is alert, follows commands appropriately, not in acute distress  HEENT: No icterus, No thrush, shotty anterior and posterior cervical adenopathy, [place]/AT  Cardiovascular: RRR, S1/S2, no rubs, no gallops  Respiratory: CTA bilaterally, no wheezing, no crackles, no rhonchi  Abdomen: Soft/+BS, RUQ tender without rebound, non distended, no  guarding  Extremities: No edema, No lymphangitis, No petechiae, No rashes, no synovitis  Data Reviewed: Basic Metabolic Panel:  Recent Labs Lab 12/06/13 0518 12/07/13 0440 12/08/13 0640 12/09/13 0540 12/11/13 0449  NA 139 140 135* 140 140  K 3.6* 4.4 4.0 3.6* 3.4*  CL 99 101 100 104 102  CO2 28 28 25 27 29   GLUCOSE 183* 166* 197* 151* 156*  BUN 12 10 7 7 11   CREATININE 0.90 0.90 0.85 0.92 0.86  CALCIUM 8.4 9.0 8.3* 8.3* 8.5  MG  --   --   --   --  2.2   Liver Function Tests: No results found for this basename: AST, ALT, ALKPHOS, BILITOT, PROT, ALBUMIN,  in the last 168 hours No results found for this basename: LIPASE, AMYLASE,  in the last 168 hours No results found for this basename: AMMONIA,  in  the last 168 hours CBC:  Recent Labs Lab 12/06/13 0518 12/07/13 0440 12/08/13 0640 12/09/13 0540 12/11/13 0449  WBC 6.5 5.9 11.4* 6.4 7.1  HGB 10.4* 11.2* 10.7* 9.6* 9.7*  HCT 32.6* 34.7* 33.0* 31.0* 30.6*  MCV 88.8 87.8 87.5 89.9 89.7  PLT 261 301 265 268 281   Cardiac Enzymes: No results found for this basename: CKTOTAL, CKMB, CKMBINDEX, TROPONINI,  in the last 168 hours BNP: No components found with this basename: POCBNP,  CBG:  Recent Labs Lab 12/11/13 0756 12/11/13 1129 12/11/13 1630 12/11/13 2150 12/12/13 0752  GLUCAP 142* 220* 149* 129* 121*    Recent Results (from the past 240 hour(s))  CULTURE, BLOOD (ROUTINE X 2)     Status: None   Collection Time    12/03/13  1:15 PM      Result Value Range Status   Specimen Description BLOOD RIGHT FOREARM  5 ML IN [facility] BOTTLE   Final   Special Requests NONE   Final   Culture  Setup Time     Final   Value: 12/03/2013 19:43     Performed at [facility]   Culture     Final   Value: NO GROWTH 5 DAYS     Performed at [facility]   Report Status 12/09/2013 FINAL   Final  CULTURE, BLOOD (ROUTINE X 2)     Status: None   Collection Time    12/03/13  2:09 PM      Result Value Range Status   Specimen Description BLOOD RIGHT FOREARM  5 ML IN [facility] BOTTLE   Final   Special Requests NONE   Final   Culture  Setup Time     Final   Value: 12/03/2013 19:43     Performed at [facility]   Culture     Final   Value: NO GROWTH 5 DAYS     Performed at [facility]   Report Status 12/09/2013 FINAL   Final  CULTURE, ROUTINE-ABSCESS     Status: None   Collection Time    12/04/13  4:19 PM      Result Value Range Status   Specimen Description LIVER   Final   Special Requests NONE   Final   Gram Stain     Final   Value: NO WBC SEEN     NO SQUAMOUS EPITHELIAL CELLS SEEN     NO ORGANISMS SEEN     Performed at [facility]   Culture     Final   Value: NO GROWTH 2 DAYS     Performed at  [facility]   Report Status 12/08/2013 FINAL   Final  CULTURE, EXPECTORATED SPUTUM-ASSESSMENT     Status: None   Collection Time    12/08/13 12:28 PM      Result Value Range Status   Specimen Description SPUTUM   Final   Special Requests NONE   Final   Sputum evaluation     Final   Value: MICROSCOPIC FINDINGS SUGGEST THAT THIS SPECIMEN IS NOT REPRESENTATIVE OF LOWER RESPIRATORY SECRETIONS. PLEASE RECOLLECT.     INFORMED DANA RN AT [id number] ON 01.21.15 BY SHUEA   Report Status 12/08/2013 FINAL   Final  CULTURE, EXPECTORATED SPUTUM-ASSESSMENT     Status: None   Collection Time    12/09/13  7:16 AM      Result Value Range Status   Specimen Description SPUTUM   Final   Special Requests Normal   Final   Sputum evaluation     Final   Value: MICROSCOPIC FINDINGS SUGGEST THAT THIS SPECIMEN IS NOT REPRESENTATIVE OF LOWER RESPIRATORY SECRETIONS. PLEASE RECOLLECT.     INFORMED DANDA DARKE RN AT [id number] ON 01.22.15 BY SHUEA   Report Status 12/09/2013 FINAL   Final  FUNGUS CULTURE, BLOOD     Status: None   Collection Time    12/10/13  5:00 AM      Result Value Range Status   Specimen Description BLOOD PICC   Final   Special Requests 5CC   Final   Culture     Final   Value: NO FUNGUS ISOLATED;CULTURE IN PROGRESS FOR 7 DAYS     Performed at [facility]   Report Status PENDING   Incomplete  FUNGUS CULTURE W SMEAR     Status: None   Collection Time    12/10/13  2:13 PM      Result Value Range Status   Specimen Description LIVER   Final   Special Requests NONE   Final   Fungal Smear     Final   Value: NO YEAST OR FUNGAL ELEMENTS SEEN     Performed at [facility]   Culture     Final   Value: CULTURE IN PROGRESS FOR FOUR WEEKS     Performed at [facility]   Report Status PENDING   Incomplete  TISSUE CULTURE     Status: None   Collection Time    12/10/13  2:19 PM      Result Value Range Status   Specimen Description OTHER LIVER    Final   Special Requests  Normal   Final   Gram Stain     Final   Value: RARE WBC PRESENT,BOTH PMN AND MONONUCLEAR     NO SQUAMOUS EPITHELIAL CELLS SEEN     NO ORGANISMS SEEN     Performed at [facility]   Culture     Final   Value: NO GROWTH 2 DAYS     Performed at [facility]   Report Status PENDING   Incomplete  AFB CULTURE WITH SMEAR     Status: None   Collection Time    12/10/13  2:19 PM      Result Value Range Status   Specimen Description ABSCESS TISSUE LIVER   Final   Special Requests Normal   Final   ACID FAST SMEAR     Final   Value: NO ACID FAST BACILLI SEEN     Performed at [facility]   Culture     Final  Value: CULTURE WILL BE EXAMINED FOR 6 WEEKS BEFORE ISSUING A FINAL REPORT     Performed at [facility]   Report Status PENDING   Incomplete  ANAEROBIC CULTURE     Status: None   Collection Time    12/10/13  2:19 PM      Result Value Range Status   Specimen Description LIVER   Final   Special Requests Normal   Final   Gram Stain     Final   Value: RARE WBC PRESENT,BOTH PMN AND MONONUCLEAR     NO SQUAMOUS EPITHELIAL CELLS SEEN     NO ORGANISMS SEEN     Performed at [facility]   Culture     Final   Value: NO ANAEROBES ISOLATED; CULTURE IN PROGRESS FOR 5 DAYS     Performed at [facility]   Report Status PENDING   Incomplete  AFB CULTURE, BLOOD     Status: None   Collection Time    12/11/13  4:49 AM      Result Value Range Status   Specimen Description BLOOD RIGHT ARM PICC   Final   Special Requests ACID FAST BACILLI Kinney   Final   Culture     Final   Value: CULTURE WILL BE EXAMINED FOR 6 WEEKS BEFORE ISSUING A FINAL REPORT     Performed at [facility]   Report Status PENDING   Incomplete     Scheduled Meds: . calcium-vitamin D  1 tablet Oral TID WC  . enoxaparin (LOVENOX) injection  40 mg Subcutaneous Q2200  . furosemide  40 mg Oral BID  . insulin aspart  0-15 Units Subcutaneous TID WC  . insulin aspart  4 Units  Subcutaneous TID WC  . insulin glargine  5 Units Subcutaneous QHS  . iron polysaccharides  150 mg Oral Daily  . levothyroxine  100 mcg Oral Q48H  . levothyroxine  200 mcg Oral Q48H  . lipase/protease/amylase  1 capsule Oral TID AC  . loratadine  10 mg Oral Daily  . magnesium oxide  200 mg Oral TID PC & HS  . multivitamin with minerals  1 tablet Oral Daily  . pantoprazole  40 mg Oral Daily  . potassium chloride SA  20 mEq Oral BID  . sodium chloride  3 mL Intravenous Q12H  . Vitamin D (Ergocalciferol)  50,000 Units Oral QPM   Continuous Infusions: . sodium chloride 100 mL/hr at 12/11/13 0109     Cari Vandeberg, DO  [facility] Pager [phone]  If 7PM-7AM, please contact night-coverage [email or web address] Password [email or web address] 12/12/2013, 4:21 PM   LOS: 9 days

## 2013-12-13 ENCOUNTER — Other Ambulatory Visit: Payer: Self-pay | Admitting: Internal Medicine

## 2013-12-13 LAB — TISSUE CULTURE
Culture: NO GROWTH
SPECIAL REQUESTS: NORMAL

## 2013-12-13 LAB — GLUCOSE, CAPILLARY
GLUCOSE-CAPILLARY: 227 mg/dL — AB (ref 70–99)
Glucose-Capillary: 183 mg/dL — ABNORMAL HIGH (ref 70–99)
Glucose-Capillary: 140 mg/dL — ABNORMAL HIGH (ref 70–99)
Glucose-Capillary: 246 mg/dL — ABNORMAL HIGH (ref 70–99)
Glucose-Capillary: 216 mg/dL — ABNORMAL HIGH (ref 70–99)

## 2013-12-13 LAB — CBC WITH DIFFERENTIAL/PLATELET
Basophils Absolute: 0 10*3/uL (ref 0.0–0.1)
Basophils Relative: 0 % (ref 0–1)
Eosinophils Absolute: 0.2 10*3/uL (ref 0.0–0.7)
Eosinophils Relative: 2 % (ref 0–5)
HEMATOCRIT: 29.6 % — AB (ref 36.0–46.0)
HEMOGLOBIN: 9.4 g/dL — AB (ref 12.0–15.0)
LYMPHS PCT: 18 % (ref 12–46)
Lymphs Abs: 1.7 10*3/uL (ref 0.7–4.0)
MCH: 28.4 pg (ref 26.0–34.0)
MCHC: 31.8 g/dL (ref 30.0–36.0)
MCV: 89.4 fL (ref 78.0–100.0)
MONO ABS: 1 10*3/uL (ref 0.1–1.0)
MONOS PCT: 10 % (ref 3–12)
NEUTROS ABS: 6.5 10*3/uL (ref 1.7–7.7)
Neutrophils Relative %: 69 % (ref 43–77)
Platelets: 300 10*3/uL (ref 150–400)
RBC: 3.31 MIL/uL — AB (ref 3.87–5.11)
RDW: 15.3 % (ref 11.5–15.5)
WBC: 9.5 10*3/uL (ref 4.0–10.5)

## 2013-12-13 LAB — QUANTIFERON TB GOLD ASSAY (BLOOD)
Interferon Gamma Release Assay: NEGATIVE
Mitogen value: 1.77 IU/mL
Quantiferon Nil Value: 0.08 IU/mL
TB Ag value: 0.08 IU/mL
TB Antigen Minus Nil Value: 0 IU/mL

## 2013-12-13 LAB — HISTOPLASMA ANTIGEN, URINE

## 2013-12-13 MED ORDER — INSULIN ASPART 100 UNIT/ML ~~LOC~~ SOLN
5.0000 [IU] | Freq: Three times a day (TID) | SUBCUTANEOUS | Status: DC
  Administered 2013-12-14 (×2): 5 [IU] via SUBCUTANEOUS

## 2013-12-13 MED ORDER — INSULIN GLARGINE 100 UNIT/ML ~~LOC~~ SOLN
10.0000 [IU] | Freq: Every day | SUBCUTANEOUS | Status: DC
  Administered 2013-12-13: 10 [IU] via SUBCUTANEOUS
  Filled 2013-12-13 (×2): qty 0.1

## 2013-12-13 NOTE — Progress Notes (Signed)
[facility]  Date of Admission:  12/03/2013  Antibiotics: Antibiotics Given (last 72 hours)   None      Subjective: No new events, still febrile daily, night sweats; some difficulty sleeping with telemetry alarming  Objective: Temp:  [97.4 F (36.3 C)-99.4 F (37.4 C)] 97.4 F (36.3 C) (01/26 0427) Pulse Rate:  [57-104] 57 (01/26 0427) Resp:  [16-20] 16 (01/26 0427) BP: (107-133)/(61-75) 124/69 mmHg (01/26 0907) SpO2:  [97 %-98 %] 98 % (01/26 0427)  General: awake, alert, in chair, nad CV: RRR Lungs: CTA B Abd: soft, ntnd  Lab Results Lab Results  Component Value Date   WBC 9.5 12/13/2013   HGB 9.4* 12/13/2013   HCT 29.6* 12/13/2013   MCV 89.4 12/13/2013   PLT 300 12/13/2013    Lab Results  Component Value Date   CREATININE 0.86 12/11/2013   BUN 11 12/11/2013   NA 140 12/11/2013   K 3.4* 12/11/2013   CL 102 12/11/2013   CO2 29 12/11/2013    Lab Results  Component Value Date   ALT 15 12/05/2013   AST 19 12/05/2013   ALKPHOS 123* 12/05/2013   BILITOT 0.3 12/05/2013      Microbiology: Recent Results (from the past 240 hour(s))  CULTURE, BLOOD (ROUTINE X 2)     Status: None   Collection Time    12/03/13  1:15 PM      Result Value Range Status   Specimen Description BLOOD RIGHT FOREARM  5 ML IN [facility] BOTTLE   Final   Special Requests NONE   Final   Culture  Setup Time     Final   Value: 12/03/2013 19:43     Performed at [place]     Final   Value: NO GROWTH 5 DAYS     Performed at [facility]   Report Status 12/09/2013 FINAL   Final  CULTURE, BLOOD (ROUTINE X 2)     Status: None   Collection Time    12/03/13  2:09 PM      Result Value Range Status   Specimen Description BLOOD RIGHT FOREARM  5 ML IN [facility] BOTTLE   Final   Special Requests NONE   Final   Culture  Setup Time     Final   Value: 12/03/2013 19:43     Performed at [facility]   Culture     Final   Value: NO GROWTH 5 DAYS   Performed at [facility]   Report Status 12/09/2013 FINAL   Final  CULTURE, ROUTINE-ABSCESS     Status: None   Collection Time    12/04/13  4:19 PM      Result Value Range Status   Specimen Description LIVER   Final   Special Requests NONE   Final   Gram Stain     Final   Value: NO WBC SEEN     NO SQUAMOUS EPITHELIAL CELLS SEEN     NO ORGANISMS SEEN     Performed at [facility]   Culture     Final   Value: NO GROWTH 2 DAYS     Performed at [facility]   Report Status 12/08/2013 FINAL   Final  CULTURE, EXPECTORATED SPUTUM-ASSESSMENT     Status: None   Collection Time    12/08/13 12:28 PM      Result Value Range Status   Specimen Description SPUTUM   Final  Special Requests NONE   Final   Sputum evaluation     Final   Value: MICROSCOPIC FINDINGS SUGGEST THAT THIS SPECIMEN IS NOT REPRESENTATIVE OF LOWER RESPIRATORY SECRETIONS. PLEASE RECOLLECT.     INFORMED DANA RN AT [id number] ON 01.21.15 BY SHUEA   Report Status 12/08/2013 FINAL   Final  CULTURE, EXPECTORATED SPUTUM-ASSESSMENT     Status: None   Collection Time    12/09/13  7:16 AM      Result Value Range Status   Specimen Description SPUTUM   Final   Special Requests Normal   Final   Sputum evaluation     Final   Value: MICROSCOPIC FINDINGS SUGGEST THAT THIS SPECIMEN IS NOT REPRESENTATIVE OF LOWER RESPIRATORY SECRETIONS. PLEASE RECOLLECT.     INFORMED DANDA DARKE RN AT [id number] ON 01.22.15 BY SHUEA   Report Status 12/09/2013 FINAL   Final  FUNGUS CULTURE, BLOOD     Status: None   Collection Time    12/10/13  5:00 AM      Result Value Range Status   Specimen Description BLOOD PICC   Final   Special Requests 5CC   Final   Culture     Final   Value: NO FUNGUS ISOLATED;CULTURE IN PROGRESS FOR 7 DAYS     Performed at [facility]   Report Status PENDING   Incomplete  FUNGUS CULTURE W SMEAR     Status: None   Collection Time    12/10/13  2:13 PM      Result Value Range Status   Specimen  Description LIVER   Final   Special Requests NONE   Final   Fungal Smear     Final   Value: NO YEAST OR FUNGAL ELEMENTS SEEN     Performed at [facility]   Culture     Final   Value: CULTURE IN PROGRESS FOR FOUR WEEKS     Performed at [facility]   Report Status PENDING   Incomplete  TISSUE CULTURE     Status: None   Collection Time    12/10/13  2:19 PM      Result Value Range Status   Specimen Description OTHER LIVER    Final   Special Requests Normal   Final   Gram Stain     Final   Value: RARE WBC PRESENT,BOTH PMN AND MONONUCLEAR     NO SQUAMOUS EPITHELIAL CELLS SEEN     NO ORGANISMS SEEN     Performed at [facility]   Culture     Final   Value: NO GROWTH 3 DAYS     Performed at [facility]   Report Status 12/13/2013 FINAL   Final  AFB CULTURE WITH SMEAR     Status: None   Collection Time    12/10/13  2:19 PM      Result Value Range Status   Specimen Description ABSCESS TISSUE LIVER   Final   Special Requests Normal   Final   ACID FAST SMEAR     Final   Value: NO ACID FAST BACILLI SEEN     Performed at [facility]   Culture     Final   Value: CULTURE WILL BE EXAMINED FOR 6 WEEKS BEFORE ISSUING A FINAL REPORT     Performed at [facility]   Report Status PENDING   Incomplete  ANAEROBIC CULTURE     Status: None   Collection Time    12/10/13  2:19 PM      Result Value Range Status   Specimen Description LIVER   Final   Special Requests Normal   Final   Gram Stain     Final   Value: RARE WBC PRESENT,BOTH PMN AND MONONUCLEAR     NO SQUAMOUS EPITHELIAL CELLS SEEN     NO ORGANISMS SEEN     Performed at [facility]   Culture     Final   Value: NO ANAEROBES ISOLATED; CULTURE IN PROGRESS FOR 5 DAYS     Performed at [facility]   Report Status PENDING   Incomplete  AFB CULTURE, BLOOD     Status: None   Collection Time    12/11/13  4:49 AM      Result Value Range Status   Specimen Description BLOOD  RIGHT ARM PICC   Final   Special Requests ACID FAST BACILLI Mor[place]   Final   Culture     Final   Value: CULTURE WILL BE EXAMINED FOR 6 WEEKS BEFORE ISSUING A FINAL REPORT     Performed at [facility]   Report Status PENDING   Incomplete    Studies/Results: No results found.  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  Stains all negative.  Appears more infectious/inflammatory than cancer.  Differential broad including fungal, AFB including tuberculosis, sarcoid and other autoimmune causes.  Continues to have fever and seems source related to this.  Did get new biopsy and cultures ngtd, AFB smear negative.  Other labs pending.   -I further discussed prednisone tapers, it does not seem to be correlated with worsening (or improvement) but would still avoid until diagnosis established -she does still get night sweats suggesting mycobacterial disease  -It may take weeks for anything to develop, as long as she remains stable, I would not start empiric antibiotics.  I will follow culturesas an outpatient if/when she is discharged      COMER, Herbie [place], [place] [facility] [place] [email or web address] [phone] pager   [phone] cell 12/13/2013, 12:04 PM

## 2013-12-13 NOTE — Progress Notes (Signed)
Medical Oncology:  Given her recent biopsy is negative for malignancy, we will sign off.  We will arrange appropriate follow-up with the patient once she is discharged.  Concha Norway, MD.

## 2013-12-13 NOTE — Progress Notes (Addendum)
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 Willette Brace liver biopsy (12/10/13) with fungal and AFB stains and culture  -fungal, brucella, coxiella serologies sent;  -QuantiFERON-TB--negative -agree with d/c antibacterials  -case discussed with IR, Dr. 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 Birdie Sons at [facility] center on 02/22/2008.  -radical resection for node-positive adenocarcinoma of the bile duct with positive lymph node involvement (2 out of 17 lymph nodes) with positive surgical margins.  -appreciated Dr. Juliann Mule followup  DM2  -12/07/2013 hemoglobin A1c 7.9  -Continue Humalog sliding scale  -increase NovoLog 5 units before each meal  -increase to Lantus 10 units  Pancreatic insufficiency  -Continue Creon  -Patient is eating well  Hypothyroidism  -Continue Synthroid  Anemia of chronic disease  -Hemoglobin is stable  Family Communication: husband at beside  Disposition Plan: Home 12/14/13 if stable          Procedures/Studies: Dg Chest 2 View  12/03/2013    CLINICAL DATA:  Fever, cough.  EXAM: CHEST  2 VIEW  COMPARISON:  November 25, 2013.  FINDINGS: The heart size and mediastinal contours are within normal limits. Both lungs are clear. No pleural effusion or pneumothorax is noted. The visualized skeletal structures are unremarkable.  IMPRESSION: No active cardiopulmonary disease.   Electronically Signed   By: [name] M.D.   On: 12/03/2013 14:14   Dg Chest 2 View  11/25/2013   CLINICAL DATA:  Mental status change, fever and chills, history of biliary tract cancer  EXAM: CHEST  2 VIEW  COMPARISON:  CT scan of this thorax dated November 19, 2013 and chest x-ray of the same day.  FINDINGS: The lungs are adequately inflated. There is no focal infiltrate. There is mild symmetric prominence of the pulmonary interstitial markings bilaterally. There is no pleural effusion. The cardiopericardial silhouette is normal in size. The pulmonary vascularity is not engorged. The mediastinum is normal in width very there is no pleural effusion. The observed portions of the bony thorax exhibit no acute abnormalities. There are surgical clips in the left upper quadrant of the abdomen.  IMPRESSION: There is no evidence of pneumonia nor CHF nor other acute cardiopulmonary abnormality. Mild prominence of the pulmonary interstitial markings bilaterally may be related to the patient's previous smoking history.   Electronically Signed   By: Kenley Rettinger  [place]   On: 11/25/2013 16:18   Dg Chest 2 View  11/19/2013   CLINICAL DATA:  Shortness of breath and headache.  EXAM: CHEST  2 VIEW  COMPARISON:  03/02/2010  FINDINGS: Lungs are adequately inflated without consolidation or effusion. Cardiomediastinal silhouette is within normal. There is mild spondylosis of the spine. Multiple surgical clips over the abdomen.  IMPRESSION: No active cardiopulmonary disease.   Electronically Signed  By: Marin Olp M.D.   On: 11/19/2013 13:25   Ct Head Wo Contrast  11/19/2013   CLINICAL DATA:  Headache, no  known injury; history of adenocarcinoma of the bile duct  EXAM: CT HEAD WITHOUT CONTRAST  TECHNIQUE: Contiguous axial images were obtained from the base of the skull through the vertex without intravenous contrast.  COMPARISON:  PET-CT -06/12/2009  FINDINGS: Gray-white differentiation is maintained. Incidental note is made of prominent VR spaces within the caudal medial aspects of the bilateral temporal lobes. No CT evidence for acute large territory infarct. No intraparenchymal or extra-axial mass or hemorrhage. Normal size and configuration of the ventricles and basilar cisterns. No midline. 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 The remaining paranasal sinuses mastoid air cells appear normally aerated. Regional soft tissues appear normal. No displaced calvarial fracture.  IMPRESSION: Negative noncontrast head CT.   Electronically Signed   By: [name] M.D.   On: 11/19/2013 12:58   Ct Angio Chest Pe W/cm &/or Wo Cm  11/19/2013   CLINICAL DATA:  Shortness of breath, chest pain.  EXAM: CT ANGIOGRAPHY CHEST WITH CONTRAST  TECHNIQUE: Multidetector CT imaging of the chest was performed using the standard protocol during bolus administration of intravenous contrast. Multiplanar CT image reconstructions including MIPs were obtained to evaluate the vascular anatomy.  CONTRAST:  175mL OMNIPAQUE IOHEXOL 350 MG/ML SOLN  COMPARISON:  CT scan of Apr 14, 2013.  FINDINGS: No pleural effusion or pneumothorax is noted. No significant pulmonary parenchymal abnormality is noted. The thoracic aorta appears normal without evidence of dissection or aneurysm. Pulmonary arteries appear normal. There is no evidence of pulmonary embolus no significant mediastinal adenopathy is noted. Ill-defined low-density measuring 4 cm is seen in the posterior segment of the right hepatic lobe ; further evaluation with MRI is recommended. No osseous  abnormality is noted in the chest  Review of the MIP images confirms the above findings.  IMPRESSION: No evidence of pulmonary embolus. No acute pulmonary disease is noted. 4 cm ill-defined low density is noted in the posterior segment of the right hepatic lobe concerning for possible neoplasm ; further evaluation with MRI is recommended.   Electronically Signed   By: [name] M.D.   On: 11/19/2013 14:58   Mr Jeri Cos SA Contrast  12/06/2013   CLINICAL DATA:  Bile duct cancer post resection 2009. Fever. Headaches. Diabetic hypertensive hyperlipidemia patient.  EXAM: MRI HEAD WITHOUT AND WITH CONTRAST  TECHNIQUE: Multiplanar, multiecho pulse sequences of the brain and surrounding structures were obtained without and with intravenous contrast.  CONTRAST:  14 cc MultiHance.  COMPARISON:  11/19/2013 CT.  No comparison brain MR.  FINDINGS: No acute infarct.  No intracranial hemorrhage.  Minimal nonspecific white matter type changes without MR evidence of intracranial infection. If this is of high clinical concern, correlation with cerebral spinal fluid analysis may then be considered.  Prominent peri vascular spaces versus remote basal ganglia lacune infarcts.  No intracranial mass or abnormal enhancement.  Major intracranial vascular structures are patent.  Cervical medullary junction, pituitary region, pineal region and orbital structures unremarkable.  Minimal mucosal thickening inferior aspect right maxillary sinus.  IMPRESSION: No acute infarct.  Minimal nonspecific white matter type changes without MR evidence of intracranial infection. If this is of high clinical concern, correlation with cerebral spinal fluid analysis may then be considered.  Prominent peri vascular spaces versus remote basal ganglia lacune  infarcts.  No intracranial mass or abnormal enhancement.   Electronically Signed   By: Chauncey Cruel M.D.   On: 12/06/2013 17:08   Mr Lumbar Spine W Wo Contrast  12/06/2013   CLINICAL DATA:  Bile duct  cancer. Fever. Back pain. Query abscess/ infection.  EXAM: MRI LUMBAR SPINE WITHOUT AND WITH CONTRAST  TECHNIQUE: Multiplanar and multiecho pulse sequences of the lumbar spine were obtained without and with intravenous contrast.  CONTRAST:  55mL MULTIHANCE GADOBENATE DIMEGLUMINE 529 MG/ML IV SOLN  COMPARISON:  MR ABDOMEN WO/W CM dated 11/25/2013; CT ABD/PELVIS W CM dated 12/03/2013  FINDINGS: The lowest lumbar type non-rib-bearing vertebra is labeled as L5. The conus medullaris appears normal. Conus level: L1-2.  No vertebral subluxation is observed. Intervertebral disc desiccation is observed at L4-5. No significant abnormal lumbar vertebral edema or vertebral enhancement identified.  Right kidney upper pole atrophy/scarring noted. No findings of discitis -osteomyelitis. No significant abnormal epidural process. No clumping of nerve roots to suggest arachnoiditis.  Despite efforts by the technologist and patient, motion artifact is present on today's exam and could not be eliminated. This reduces exam sensitivity and specificity. Additional findings at individual levels are as follows:  L1-2:  Unremarkable.  L2-3:  No impingement.  Left facet arthropathy.  L3-4:  Unremarkable.  L4-5: No impingement. Minimal disc bulge with right foraminal annular tear.  L5-S1:  No impingement.  Right greater than left facet arthropathy.  IMPRESSION: 1. No lumbar impingement or findings suspicious for infection or malignancy involving the lumbar spine. 2. Mild lumbar spondylosis and degenerative disc disease.   Electronically Signed   By: Sherryl Barters M.D.   On: 12/06/2013 16:55   Mr Abdomen W Wo Contrast  11/25/2013   CLINICAL DATA:  Abdominal pain with nausea, vomiting and fever. 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 Liver lesion on CT.  EXAM: MRI ABDOMEN WITHOUT AND WITH CONTRAST  TECHNIQUE: Multiplanar multisequence MR imaging of the abdomen was performed both before and after the administration  of intravenous contrast.  CONTRAST:  14mL MULTIHANCE GADOBENATE DIMEGLUMINE 529 MG/ML IV SOLN  COMPARISON:  CT ABD/PELVIS W CM dated 02/19/2013; CT ABD/PELVIS W CM dated 08/14/2012; MR ABDOMEN WO/W CM dated 03/06/2010; CT ANGIO CHEST W/CM &/OR WO/CM dated 11/19/2013  FINDINGS: The previously demonstrated diffuse severe hepatic steatosis has improved. Medially within the right hepatic lobe at the site of the previous geographic sparing of steatosis is a new heterogeneous mass. This demonstrates peripheral heterogeneous enhancement following contrast, measuring approximately 5.6 x 3.7 cm transverse on image 23 of series 1201. There is inferior extension of tumor along the posteromedial hepatic capsule. There is extrinsic mass effect on the IVC which is narrowed, but patent. The hepatic and portal veins are patent. The dominant liver lesion demonstrates central cystic or necrotic components which do not enhance.  No other focal liver lesions are identified. There is stable mild intrahepatic biliary dilatation status post choledochojejunostomy. There is stable atrophy of the pancreatic body and tail. Soft tissue stranding and enhancement surrounding the duodenum and IVC are similar to the prior examination, likely postsurgical in etiology.  The spleen, adrenal glands and kidneys demonstrate no significant findings. There is no hydronephrosis. There is upper abdominal adenopathy within the portacaval space and retroperitoneum. There is a 1.4 cm aortic caval node which demonstrates central necrosis. There is a new enhancing 11 mm nodule inferior to the right hepatic lobe which may reflect a small peritoneal implant. No significant ascites is seen.  IMPRESSION:  1. As demonstrated on recent Chest CT, there is a new peripherally enhancing mass posteromedially within the right hepatic lobe, highly worrisome for local recurrence or metastatic disease from the patient's bile duct adenocarcinoma. A liver abscess could have this  appearance, although that possibility is considered much less likely. 2. Associated necrotic upper abdominal adenopathy, also worrisome for metastatic disease. Possible small peritoneal implant adjacent to the right hepatic lobe. 3. No significant biliary dilatation. 4. Extrinsic mass effect on the IVC without evidence of occlusion.   Electronically Signed   By: Camie Patience M.D.   On: 11/25/2013 18:37   Ct Abdomen Pelvis W Contrast  12/03/2013   CLINICAL DATA:  History of Whipple procedure for bile duct carcinoma. New sepsis. New liver lesion  EXAM: CT ABDOMEN AND PELVIS WITH CONTRAST  TECHNIQUE: Multidetector CT imaging of the abdomen and pelvis was performed using the standard protocol following bolus administration of intravenous contrast.  CONTRAST:  145mL OMNIPAQUE IOHEXOL 300 MG/ML  SOLN  COMPARISON:  MR ABDOMEN WO/W CM dated 11/25/2013  FINDINGS: Lung bases are clear.  No pericardial fluid.  Again demonstrated a large subcapsular lesion within the posterior right hepatic lobe measuring 4.5 x 4.5 cm similar to 4.8 x 4.3 cm on comparison MRI. This lesion continues demonstrate mild peripheral enhancement and central heterogeneity. No additional hepatic lesions. There is pneumobilia in the left hepatic lobe consistent with choledochojejunostomy. There is some edema involving the bowel along the porta hepatis and small amount of fluid small of fluid along Morrison's pouch is similar prior.  Small nodule inferior right hepatic lobe measuring 8 mm unchanged (image 31, series 2 ). The body and tail of the pancreas normal. The spleen, adrenal glands, kidneys are normal. There is some cortical thinning on the right.  Small bowel and colon are unremarkable. There is no evidence of abscess in the pelvis.  Post hysterectomy anatomy. The bladder is normal. No pelvic lymphadenopathy. No aggressive osseous lesion.  IMPRESSION: 1. No change in large enhancing lesion the posterior aspect of the right hepatic lobe. In patient  with sepsis of unknown origin would consider hepatic abscess as a distinct possibility although adenocarcinoma recurrence cannot be excluded. Recommend ultrasound guided percutaneous biopsy of this lesion to differentiate between abscess and malignancy. Favor abscess. 2. Edema along the proximal bowel and Morrison's pouch likely related to the hepatic process. 3. Patient status post a Whipple procedure without complicating features. This was made a call report.   Electronically Signed   By: Suzy Bouchard M.D.   On: 12/03/2013 20:18   US Biopsy  12/10/2013   CLINICAL DATA:  Right lobe liver lesion. 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 Request has been made to obtain more tissue for areas culture studies.  EXAM: ULTRASOUND GUIDED ASPIRATE AND CORE BIOPSY OF LIVER  MEDICATIONS: 2.0 mg IV Versed; 100 mcg IV Fentanyl  Total Moderate Sedation Time: 25 min  PROCEDURE: The procedure, risks, benefits, and alternatives were explained to the patient. Questions regarding the procedure were encouraged and answered. The patient understands and consents to the procedure.  The right abdominal wall was prepped with chlorhexidine in a sterile fashion, and a sterile drape was applied covering the operative field. A sterile gown and sterile gloves were used for the procedure. Local anesthesia was provided with 1% Lidocaine.  Under CT guidance, a 19 gauge trocar needle was advanced to the level of the posterior right hepatic lesion previously sampled. Both 22 gauge and 20 gauge Chiba needle  aspirate samples were obtained. A total of 3 FNA samples were combined with sterile saline. Two separate 20 gauge core biopsy samples were also obtained and combined with the aspirate samples in sterile saline. The outer needle was then removed.  COMPLICATIONS: None.  FINDINGS: Multiple FNA and core biopsy samples were combined, as above for a variety of requested culture studies.  IMPRESSION:  Ultrasound-guided needle aspirate and core biopsy of right hepatic lesion for culture studies.   Electronically Signed   By: Irish Lack M.D.   On: 12/10/2013 17:43   US Biopsy  12/04/2013   CLINICAL DATA:  58 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 Patient has an elevated white count and chills. Evaluate for recurrent cancer versus abscess.  EXAM: ULTRASOUND GUIDED BIOPSY OF THE LIVER LESION  MEDICATIONS: 2.0 mg IV Versed; 100 mcg IV Fentanyl. A radiology nurse monitored the patient throughout the procedure.  Total Moderate Sedation Time: 25 min  PROCEDURE: The procedure, risks, benefits, and alternatives were explained to the patient. Questions regarding the procedure were encouraged and answered. The patient understands and consents to the procedure.  The right upper abdomen was prepped with chlorhexidine. A sterile drape was placed. The skin and liver capsule were anesthetized with 1% lidocaine. A 17 gauge needle was directed into the posterior right hepatic lesion and tried to aspirate fluid from the 17 gauge needle. No purulent fluid was obtained. A small amount of bloody fluid was collected. Three core biopsies were obtained with an 18 gauge device. Specimens were placed in formalin. Needle removed without complication.  COMPLICATIONS: None.  FINDINGS: Heterogeneous hypoechoic lesion in the posterior right hepatic lobe. Needle placement was confirmed within the lesion. Following the first core biopsy, there was development of multiple echogenic foci throughout the lesion. Echogenic foci probably represent gas from the biliary system. No significant bleeding.  IMPRESSION: Ultrasound-guided core biopsies of the liver lesion. Solid core material was obtained from the biopsies and no purulent fluid could be obtained. Findings are suspicious for recurrent cancer rather than abscess. A sample of the aspirated blood was sent for Gram stain and culture.   Electronically Signed   By:  Richarda Overlie M.D.   On: 12/04/2013 16:36         Subjective: Patient denies fevers, chills, chest discomfort, shortness breath, nausea, vomiting, diarrhea, abdominal pain, hemoptysis, headache, visual disturbance.  Objective: Filed Vitals:   12/12/13 2201 12/13/13 0427 12/13/13 0907 12/13/13 1412  BP: 107/61 120/67 124/69 129/69  Pulse: 66 57  63  Temp: 97.8 F (36.6 C) 97.4 F (36.3 C)  97.9 F (36.6 C)  TempSrc: Oral Oral Oral Oral  Resp: 20 16  18   Height:      Weight:      SpO2: 97% 98%  98%    Intake/Output Summary (Last 24 hours) at 12/13/13 1758 Last data filed at 12/13/13 1500  Gross per 24 hour  Intake   4220 ml  Output   1625 ml  Net   2595 ml   Weight change:  Exam:   General:  Pt is alert, follows commands appropriately, not in acute distress  HEENT: No icterus, No thrush,[place]/AT  Cardiovascular: RRR, S1/S2, no rubs, no gallops  Respiratory: CTA bilaterally, no wheezing, no crackles, no rhonchi  Abdomen: Soft/+BS, non tender, non distended, no guarding  Extremities: trace LE edema, No lymphangitis, No petechiae, No rashes, no synovitis  Data Reviewed: Basic Metabolic Panel:  Recent Labs Lab 12/07/13 0440 12/08/13 0640  12/09/13 0540 12/11/13 0449  NA 140 135* 140 140  K 4.4 4.0 3.6* 3.4*  CL 101 100 104 102  CO2 28 25 27 29   GLUCOSE 166* 197* 151* 156*  BUN 10 7 7 11   CREATININE 0.90 0.85 0.92 0.86  CALCIUM 9.0 8.3* 8.3* 8.5  MG  --   --   --  2.2   Liver Function Tests: No results found for this basename: AST, ALT, ALKPHOS, BILITOT, PROT, ALBUMIN,  in the last 168 hours No results found for this basename: LIPASE, AMYLASE,  in the last 168 hours No results found for this basename: AMMONIA,  in the last 168 hours CBC:  Recent Labs Lab 12/07/13 0440 12/08/13 0640 12/09/13 0540 12/11/13 0449 12/13/13 0520  WBC 5.9 11.4* 6.4 7.1 9.5  NEUTROABS  --   --   --   --  6.5  HGB 11.2* 10.7* 9.6* 9.7* 9.4*  HCT 34.7* 33.0* 31.0* 30.6*  29.6*  MCV 87.8 87.5 89.9 89.7 89.4  PLT 301 265 268 281 300   Cardiac Enzymes: No results found for this basename: CKTOTAL, CKMB, CKMBINDEX, TROPONINI,  in the last 168 hours BNP: No components found with this basename: POCBNP,  CBG:  Recent Labs Lab 12/12/13 1258 12/12/13 1653 12/12/13 2204 12/13/13 0748 12/13/13 1213  GLUCAP 191* 186* 183* 140* 246*    Recent Results (from the past 240 hour(s))  CULTURE, ROUTINE-ABSCESS     Status: None   Collection Time    12/04/13  4:19 PM      Result Value Range Status   Specimen Description LIVER   Final   Special Requests NONE   Final   Gram Stain     Final   Value: NO WBC SEEN     NO SQUAMOUS EPITHELIAL CELLS SEEN     NO ORGANISMS SEEN     Performed at [facility]   Culture     Final   Value: NO GROWTH 2 DAYS     Performed at [facility]   Report Status 12/08/2013 FINAL   Final  CULTURE, EXPECTORATED SPUTUM-ASSESSMENT     Status: None   Collection Time    12/08/13 12:28 PM      Result Value Range Status   Specimen Description SPUTUM   Final   Special Requests NONE   Final   Sputum evaluation     Final   Value: MICROSCOPIC FINDINGS SUGGEST THAT THIS SPECIMEN IS NOT REPRESENTATIVE OF LOWER RESPIRATORY SECRETIONS. PLEASE RECOLLECT.     INFORMED DANA RN AT [id number] ON 01.21.15 BY SHUEA   Report Status 12/08/2013 FINAL   Final  CULTURE, EXPECTORATED SPUTUM-ASSESSMENT     Status: None   Collection Time    12/09/13  7:16 AM      Result Value Range Status   Specimen Description SPUTUM   Final   Special Requests Normal   Final   Sputum evaluation     Final   Value: MICROSCOPIC FINDINGS SUGGEST THAT THIS SPECIMEN IS NOT REPRESENTATIVE OF LOWER RESPIRATORY SECRETIONS. PLEASE RECOLLECT.     INFORMED DANDA DARKE RN AT [id number] ON 01.22.15 BY SHUEA   Report Status 12/09/2013 FINAL   Final  FUNGUS CULTURE, BLOOD     Status: None   Collection Time    12/10/13  5:00 AM      Result Value Range Status   Specimen Description  BLOOD PICC   Final   Special Requests [facility]   Final   Culture  Final   Value: NO FUNGUS ISOLATED;CULTURE IN PROGRESS FOR 7 DAYS     Performed at [facility]   Report Status PENDING   Incomplete  FUNGUS CULTURE W SMEAR     Status: None   Collection Time    12/10/13  2:13 PM      Result Value Range Status   Specimen Description LIVER   Final   Special Requests NONE   Final   Fungal Smear     Final   Value: NO YEAST OR FUNGAL ELEMENTS SEEN     Performed at [facility]   Culture     Final   Value: CULTURE IN PROGRESS FOR FOUR WEEKS     Performed at [facility]   Report Status PENDING   Incomplete  TISSUE CULTURE     Status: None   Collection Time    12/10/13  2:19 PM      Result Value Range Status   Specimen Description OTHER LIVER    Final   Special Requests Normal   Final   Gram Stain     Final   Value: RARE WBC PRESENT,BOTH PMN AND MONONUCLEAR     NO SQUAMOUS EPITHELIAL CELLS SEEN     NO ORGANISMS SEEN     Performed at [facility]   Culture     Final   Value: NO GROWTH 3 DAYS     Performed at [facility]   Report Status 12/13/2013 FINAL   Final  AFB CULTURE WITH SMEAR     Status: None   Collection Time    12/10/13  2:19 PM      Result Value Range Status   Specimen Description ABSCESS TISSUE LIVER   Final   Special Requests Normal   Final   ACID FAST SMEAR     Final   Value: NO ACID FAST BACILLI SEEN     Performed at [facility]   Culture     Final   Value: CULTURE WILL BE EXAMINED FOR 6 WEEKS BEFORE ISSUING A FINAL REPORT     Performed at [facility]   Report Status PENDING   Incomplete  ANAEROBIC CULTURE     Status: None   Collection Time    12/10/13  2:19 PM      Result Value Range Status   Specimen Description LIVER   Final   Special Requests Normal   Final   Gram Stain     Final   Value: RARE WBC PRESENT,BOTH PMN AND MONONUCLEAR     NO SQUAMOUS EPITHELIAL CELLS SEEN     NO ORGANISMS SEEN      Performed at [facility]   Culture     Final   Value: NO ANAEROBES ISOLATED; CULTURE IN PROGRESS FOR 5 DAYS     Performed at [facility]   Report Status PENDING   Incomplete  AFB CULTURE, BLOOD     Status: None   Collection Time    12/11/13  4:49 AM      Result Value Range Status   Specimen Description BLOOD RIGHT ARM PICC   Final   Special Requests ACID FAST BACILLI [place]   Final   Culture     Final   Value: CULTURE WILL BE EXAMINED FOR 6 WEEKS BEFORE ISSUING A FINAL REPORT     Performed at [facility]   Report Status PENDING   Incomplete     Scheduled Meds: .  calcium-vitamin D  1 tablet Oral TID WC  . enoxaparin (LOVENOX) injection  40 mg Subcutaneous Q2200  . furosemide  40 mg Oral BID  . insulin aspart  0-15 Units Subcutaneous TID WC  . [START ON 12/14/2013] insulin aspart  5 Units Subcutaneous TID WC  . insulin glargine  10 Units Subcutaneous QHS  . iron polysaccharides  150 mg Oral Daily  . levothyroxine  100 mcg Oral Q48H  . levothyroxine  200 mcg Oral Q48H  . lipase/protease/amylase  1 capsule Oral TID AC  . loratadine  10 mg Oral Daily  . magnesium oxide  200 mg Oral TID PC & HS  . multivitamin with minerals  1 tablet Oral Daily  . pantoprazole  40 mg Oral Daily  . potassium chloride SA  20 mEq Oral BID  . sodium chloride  3 mL Intravenous Q12H  . Vitamin D (Ergocalciferol)  50,000 Units Oral QPM   Continuous Infusions: . sodium chloride 100 mL/hr at 12/13/13 1135     Rayaan Lorah, DO  [facility] Pager [phone]  If 7PM-7AM, please contact night-coverage [email or web address] Password [email or web address] 12/13/2013, 5:58 PM   LOS: 10 days

## 2013-12-14 ENCOUNTER — Telehealth: Payer: Self-pay | Admitting: Internal Medicine

## 2013-12-14 LAB — BASIC METABOLIC PANEL
BUN: 10 mg/dL (ref 6–23)
CALCIUM: 8.5 mg/dL (ref 8.4–10.5)
CO2: 27 mEq/L (ref 19–32)
CREATININE: 0.87 mg/dL (ref 0.50–1.10)
Chloride: 103 mEq/L (ref 96–112)
GFR calc Af Amer: 83 mL/min — ABNORMAL LOW (ref >90)
GFR, EST NON AFRICAN AMERICAN: 72 mL/min — AB (ref >90)
GLUCOSE: 144 mg/dL — AB (ref 70–99)
Potassium: 3.9 mEq/L (ref 3.7–5.3)
Sodium: 140 mEq/L (ref 137–147)

## 2013-12-14 LAB — TOXOPLASMA ANTIBODIES- IGG AND  IGM: Toxoplasma IgG Ratio: <3 IU/mL (ref <7.2)

## 2013-12-14 LAB — GLUCOSE, CAPILLARY
Glucose-Capillary: 127 mg/dL — ABNORMAL HIGH (ref 70–99)
Glucose-Capillary: 256 mg/dL — ABNORMAL HIGH (ref 70–99)

## 2013-12-14 MED ORDER — UNABLE TO FIND: Status: DC

## 2013-12-14 MED ORDER — GLYBURIDE 5 MG PO TABS: 10.0000 mg | ORAL_TABLET | Freq: Every day | ORAL | Status: DC

## 2013-12-14 NOTE — Discharge Summary (Signed)
Physician Discharge Summary  Courtney Stevens YHC:[id number] DOB: 04/10/1956 DOA: 12/03/2013  PCP: Courtney Richards, MD  Admit date: 12/03/2013 Discharge date: 12/14/2013  Recommendations for Outpatient Follow-up:  1. Pt will need to follow up with PCP in 2 weeks post discharge 2. Please obtain BMP to evaluate electrolytes and kidney function 3. Please also check CBC to evaluate Hg and Hct levels   Discharge Diagnoses:  Principal Problem:   SIRS (systemic inflammatory response syndrome) Active Problems:   Malignant neoplasm of extrahepatic bile ducts   DM   PANCREATIC INSUFFICIENCY   Unspecified essential hypertension   Intrinsic asthma   History of Roux-en-Y gastric bypass   Fever   Hypothyroidism   Lactic acidosis   Sepsis FUO with SIRS  -although not documented--had fever on 102.55F on 12/12/2013  -check bartonella serology--pt raised chickens  -although variable sensitivity and specificity--check toxo serology--neg -appreciate ID recommendations  -venous duplex legs--neg  -remain off of antimicrobials  -Imaging of the brain, chest, abdomen negative for infectious etiology  -PICC line 12/08/13  -All cultures negative to date  -Patient continued to have intermittent fevers during the hospitalization, but remained hemodynamically stable. 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 Willette Stevens liver biopsy (12/10/13) with fungal and AFB stains and culture  -fungal, brucella, coxiella serologies sent;  -QuantiFERON-TB--negative  -agree with d/c antibacterials  -case discussed with IR, Dr. 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 Birdie Stevens at [facility] on 02/22/2008.  -radical resection for node-positive adenocarcinoma of the bile duct with positive lymph node involvement (2 out of 17 lymph nodes) with positive surgical margins.  -appreciated Dr. Juliann Stevens followup  DM2  -12/07/2013 hemoglobin A1c 7.9  -Continue Humalog sliding scale  -increase NovoLog 5 units before each meal  -increase to Lantus 10 units  -The patient was hesitant to go home on insulin -The patient was instructed to restart her metformin at her previous home dose -Glyburide was increased to 10 mg daily -Patient was instructed to keep her glycemic log and check her sugars at least once daily and to take her glycemic log to her physician for adjustment of her diabetes regimen. Pancreatic insufficiency  -Continue Creon  -Patient is eating well  Hypothyroidism  -Continue Synthroid  Anemia of chronic disease  -Hemoglobin is stable       Discharge Condition: stable  Disposition: home Follow-up Information   Please follow up. ([place] [facility] [place])       Follow up with Courtney Gloss, MD In 2 weeks.   Specialty:  Infectious Diseases   Contact information:   [address] [address] [phone]       Follow up with Courtney Stevens, Courtney Hoselton, MD In 2 weeks.   Specialty:  Internal Medicine   Contact information:   [place] [address] [phone]       Diet:carb modified Wt Readings from Last 3 Encounters:  12/03/13 70.761 kg (156 lb)  11/28/13 72.1 kg (158 lb 15.2 oz)  11/25/13 74.39 kg (164 lb)    History of present illness:  58 year old female with diabetes mellitus, hypertension, hypothyroidism,s/p gastric bypass surgery with Roux-in-Y procedure in 2004 and bile duct cancer s/p bile duct resection in 2009  with chemotherapy/radiation, who was recently admitted to [facility] for sepsis of unclear etiology with negative cx including flu. She was treated with empiric antibiotic and UA was suspicious for UTI and thus was discharged on oral Levaquin and oral prednisone. Her last abx dose was 2 days back .  In December 2014 (11/16/2013), she was treated with Levaquin and Prednisone for URI (by her PCP). -Her symptoms of weakness poor appetite and lethargy has actually been doing one since October last year.  MR of the abdomen on recent hospitalization worrisome for local recurrence of the metastatic disease in liver. Patient was started on IV fluids, IV insulin and after blood cultures done, broad-spectrum IV antibiotics were started due to concerns for early sepsis. Hospitalists were called for further evaluation and admission.  She was discharged on 1/13 and reports that she felt fine until the next day when she again felt quite weak and had cough with whitish phlegm. She felt better yesterday but this morning she had subjective chills and increased weakness with nausea and generalized body aches. She also reports increased frequency of urination for possible dose. She denies any headache, blurred vision, dizziness, chest pain, palpitations, shortness of breath, abdominal pain, dysuria, hematuria, diarrhea, joint pains.   Consultants: ID--Dr. Luciana Stevens MedOnc--Dr. [name]  Discharge Exam: Filed Vitals:   12/14/13 0903  BP: 144/81  Pulse: 104  Temp: 100.2 F (37.9 C)  Resp: 18   Filed Vitals:   12/13/13 1412 12/13/13 2105 12/14/13 0530 12/14/13 0903  BP: 129/69 153/87 137/80 144/81  Pulse: 63 84 88 104  Temp: 97.9 F (36.6 C) 98.8 F (37.1 C) 99.5 F (37.5 C) 100.2 F (37.9 C)  TempSrc: Oral Oral Oral Oral  Resp: 18 20 20 18   Height:      Weight:      SpO2: 98% 100% 96% 98%   General: A&O x 3, NAD, pleasant, cooperative Cardiovascular: RRR, no rub, no gallop, no S3 Respiratory: CTAB, no wheeze, no  rhonchi Abdomen:soft, nontender, nondistended, positive bowel sounds Extremities: No edema, No lymphangitis, no petechiae  Discharge Instructions      Discharge Orders   Future Appointments Provider Department Dept Phone   12/24/2013 9:00 AM Courtney Cowboy, MD [facility] [phone]   02/28/2014 9:30 AM [facility] [phone]   02/28/2014 10:00 AM [facility] Covering Provider 1 [facility] [phone]   Future Orders Complete By Expires   Diet - low sodium heart healthy  As directed    Discharge instructions  As directed    Comments:     Please check her temperature around the same time once daily and keep a log. Please take long to [facility] physician. Please check your sugars twice daily--once in morning and before you go to bed and keep a log and take log to your physician who will adjust your diabetes medications   Increase activity slowly  As directed        Medication List    STOP taking these medications       levofloxacin 500 MG tablet  Commonly known as:  LEVAQUIN      TAKE these medications       ALPRAZolam 1 MG tablet  Commonly known as:  XANAX  Take 1 tablet (1 mg total) by mouth  at bedtime as needed. anxiety     calcium citrate-vitamin D 315-200 MG-UNIT per tablet  Commonly known as:  CITRACAL+D  Take 1 tablet by mouth 3 (three) times daily.     cetirizine 10 MG tablet  Commonly known as:  ZYRTEC  Take 10 mg by mouth every morning.     diphenhydrAMINE 25 MG tablet  Commonly known as:  BENADRYL  Take 25 mg by mouth every 6 (six) hours as needed.     furosemide 40 MG tablet  Commonly known as:  LASIX  Take 40 mg by mouth 2 (two) times daily.     glyBURIDE 5 MG tablet  Commonly known as:  DIABETA  Take 2 tablets (10 mg total) by mouth daily with breakfast.     HYDROcodone-acetaminophen 5-325 MG per tablet  Commonly known as:   NORCO/VICODIN  Take 1 tablet by mouth every 6 (six) hours as needed for moderate pain.     IRON COMPLEX PO  Take 1 tablet by mouth daily. Pt takes 65mg  iron     levothyroxine 200 MCG tablet  Commonly known as:  SYNTHROID, LEVOTHROID  Take 100-200 mcg by mouth daily. Pt alternates between 1/2 to 1 tablet daily     lipase/protease/amylase 12000 UNITS Cpep capsule  Commonly known as:  CREON-12/PANCREASE  Take 1 capsule by mouth 3 (three) times daily before meals.     Magnesium 250 MG Tabs  Take 1 tablet by mouth 4 (four) times daily.     metFORMIN 500 MG tablet  Commonly known as:  GLUCOPHAGE  Take 500 mg by mouth 3 (three) times daily with meals. One tab at breakfast, one tab at lunch and two tabs for dinner     multivitamin with minerals tablet  Take 1 tablet by mouth as needed.     omeprazole 40 MG capsule  Commonly known as:  PRILOSEC  Take 40 mg by mouth 2 (two) times daily.     OVER THE COUNTER MEDICATION  Take 1 tablet by mouth 4 (four) times daily. Calcium,magnesium,zinc supplement     potassium chloride SA 20 MEQ tablet  Commonly known as:  K-DUR,KLOR-CON  Take 20 mEq by mouth 2 (two) times daily.     UNABLE TO FIND  Ms. Kinsman was admitted to the hospital from 12/03/13 through 12/14/13.  She will be medically stable to return to work on 12/20/13.     Vitamin D (Ergocalciferol) 50000 UNITS Caps capsule  Commonly known as:  DRISDOL  Take 50,000 Units by mouth every evening.         The results of significant diagnostics from this hospitalization (including imaging, microbiology, ancillary and laboratory) are listed below for reference.    Significant Diagnostic Studies: Dg Chest 2 View  12/03/2013   CLINICAL DATA:  Fever, cough.  EXAM: CHEST  2 VIEW  COMPARISON:  November 25, 2013.  FINDINGS: The heart size and mediastinal contours are within normal limits. Both lungs are clear. No pleural effusion or pneumothorax is noted. The visualized skeletal structures are  unremarkable.  IMPRESSION: No active cardiopulmonary disease.   Electronically Signed   By: [name] M.D.   On: 12/03/2013 14:14   Dg Chest 2 View  11/25/2013   CLINICAL DATA:  Mental status change, fever and chills, history of biliary tract cancer  EXAM: CHEST  2 VIEW  COMPARISON:  CT scan of this thorax dated November 19, 2013 and chest x-ray of the same day.  FINDINGS: The lungs are adequately  inflated. There is no focal infiltrate. There is mild symmetric prominence of the pulmonary interstitial markings bilaterally. There is no pleural effusion. The cardiopericardial silhouette is normal in size. The pulmonary vascularity is not engorged. The mediastinum is normal in width very there is no pleural effusion. The observed portions of the bony thorax exhibit no acute abnormalities. There are surgical clips in the left upper quadrant of the abdomen.  IMPRESSION: There is no evidence of pneumonia nor CHF nor other acute cardiopulmonary abnormality. Mild prominence of the pulmonary interstitial markings bilaterally may be related to the patient's previous smoking history.   Electronically Signed   By: Iktan Aikman  [place]   On: 11/25/2013 16:18   Dg Chest 2 View  11/19/2013   CLINICAL DATA:  Shortness of breath and headache.  EXAM: CHEST  2 VIEW  COMPARISON:  03/02/2010  FINDINGS: Lungs are adequately inflated without consolidation or effusion. Cardiomediastinal silhouette is within normal. There is mild spondylosis of the spine. Multiple surgical clips over the abdomen.  IMPRESSION: No active cardiopulmonary disease.   Electronically Signed   By: Marin Olp M.D.   On: 11/19/2013 13:25   Ct Head Wo Contrast  11/19/2013   CLINICAL DATA:  Headache, no known injury; history of adenocarcinoma of the bile duct  EXAM: CT HEAD WITHOUT CONTRAST  TECHNIQUE: Contiguous axial images were obtained from the base of the skull through the vertex without intravenous contrast.  COMPARISON:  PET-CT -06/12/2009  FINDINGS:  Gray-white differentiation is maintained. Incidental note is made of prominent VR spaces within the caudal medial aspects of the bilateral temporal lobes. No CT evidence for acute large territory infarct. No intraparenchymal or extra-axial mass or hemorrhage. Normal size and configuration of the ventricles and basilar cisterns. No midline. 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 The remaining paranasal sinuses mastoid air cells appear normally aerated. Regional soft tissues appear normal. No displaced calvarial fracture.  IMPRESSION: Negative noncontrast head CT.   Electronically Signed   By: [name] M.D.   On: 11/19/2013 12:58   Ct Angio Chest Pe W/cm &/or Wo Cm  11/19/2013   CLINICAL DATA:  Shortness of breath, chest pain.  EXAM: CT ANGIOGRAPHY CHEST WITH CONTRAST  TECHNIQUE: Multidetector CT imaging of the chest was performed using the standard protocol during bolus administration of intravenous contrast. Multiplanar CT image reconstructions including MIPs were obtained to evaluate the vascular anatomy.  CONTRAST:  143mL OMNIPAQUE IOHEXOL 350 MG/ML SOLN  COMPARISON:  CT scan of Apr 14, 2013.  FINDINGS: No pleural effusion or pneumothorax is noted. No significant pulmonary parenchymal abnormality is noted. The thoracic aorta appears normal without evidence of dissection or aneurysm. Pulmonary arteries appear normal. There is no evidence of pulmonary embolus no significant mediastinal adenopathy is noted. Ill-defined low-density measuring 4 cm is seen in the posterior segment of the right hepatic lobe ; further evaluation with MRI is recommended. No osseous abnormality is noted in the chest  Review of the MIP images confirms the above findings.  IMPRESSION: No evidence of pulmonary embolus. No acute pulmonary disease is noted. 4 cm ill-defined low density is noted in the posterior segment of the right hepatic lobe  concerning for possible neoplasm ; further evaluation with MRI is recommended.   Electronically Signed   By: [name] M.D.   On: 11/19/2013 14:58   Mr Jeri Cos BT Contrast  12/06/2013   CLINICAL DATA:  Bile duct cancer post resection 2009. Fever. Headaches. Diabetic hypertensive hyperlipidemia patient.  EXAM: MRI HEAD WITHOUT AND WITH CONTRAST  TECHNIQUE: Multiplanar, multiecho pulse sequences of the brain and surrounding structures were obtained without and with intravenous contrast.  CONTRAST:  14 cc MultiHance.  COMPARISON:  11/19/2013 CT.  No comparison brain MR.  FINDINGS: No acute infarct.  No intracranial hemorrhage.  Minimal nonspecific white matter type changes without MR evidence of intracranial infection. If this is of high clinical concern, correlation with cerebral spinal fluid analysis may then be considered.  Prominent peri vascular spaces versus remote basal ganglia lacune infarcts.  No intracranial mass or abnormal enhancement.  Major intracranial vascular structures are patent.  Cervical medullary junction, pituitary region, pineal region and orbital structures unremarkable.  Minimal mucosal thickening inferior aspect right maxillary sinus.  IMPRESSION: No acute infarct.  Minimal nonspecific white matter type changes without MR evidence of intracranial infection. If this is of high clinical concern, correlation with cerebral spinal fluid analysis may then be considered.  Prominent peri vascular spaces versus remote basal ganglia lacune infarcts.  No intracranial mass or abnormal enhancement.   Electronically Signed   By: Chauncey Cruel M.D.   On: 12/06/2013 17:08   Mr Lumbar Spine W Wo Contrast  12/06/2013   CLINICAL DATA:  Bile duct cancer. Fever. Back pain. Query abscess/ infection.  EXAM: MRI LUMBAR SPINE WITHOUT AND WITH CONTRAST  TECHNIQUE: Multiplanar and multiecho pulse sequences of the lumbar spine were obtained without and with intravenous contrast.  CONTRAST:  62mL MULTIHANCE  GADOBENATE DIMEGLUMINE 529 MG/ML IV SOLN  COMPARISON:  MR ABDOMEN WO/W CM dated 11/25/2013; CT ABD/PELVIS W CM dated 12/03/2013  FINDINGS: The lowest lumbar type non-rib-bearing vertebra is labeled as L5. The conus medullaris appears normal. Conus level: L1-2.  No vertebral subluxation is observed. Intervertebral disc desiccation is observed at L4-5. No significant abnormal lumbar vertebral edema or vertebral enhancement identified.  Right kidney upper pole atrophy/scarring noted. No findings of discitis -osteomyelitis. No significant abnormal epidural process. No clumping of nerve roots to suggest arachnoiditis.  Despite efforts by the technologist and patient, motion artifact is present on today's exam and could not be eliminated. This reduces exam sensitivity and specificity. Additional findings at individual levels are as follows:  L1-2:  Unremarkable.  L2-3:  No impingement.  Left facet arthropathy.  L3-4:  Unremarkable.  L4-5: No impingement. Minimal disc bulge with right foraminal annular tear.  L5-S1:  No impingement.  Right greater than left facet arthropathy.  IMPRESSION: 1. No lumbar impingement or findings suspicious for infection or malignancy involving the lumbar spine. 2. Mild lumbar spondylosis and degenerative disc disease.   Electronically Signed   By: Sherryl Barters M.D.   On: 12/06/2013 16:55   Mr Abdomen W Wo Contrast  11/25/2013   CLINICAL DATA:  Abdominal pain with nausea, vomiting and fever. 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 Liver lesion on CT.  EXAM: MRI ABDOMEN WITHOUT AND WITH CONTRAST  TECHNIQUE: Multiplanar multisequence MR imaging of the abdomen was performed both before and after the administration of intravenous contrast.  CONTRAST:  84mL MULTIHANCE GADOBENATE DIMEGLUMINE 529 MG/ML IV SOLN  COMPARISON:  CT ABD/PELVIS W CM dated 02/19/2013; CT ABD/PELVIS W CM dated 08/14/2012; MR ABDOMEN WO/W CM dated 03/06/2010; CT ANGIO CHEST W/CM &/OR WO/CM dated  11/19/2013  FINDINGS: The previously demonstrated diffuse severe hepatic steatosis has improved. Medially within the right hepatic lobe at the site of the previous geographic sparing of  steatosis is a new heterogeneous mass. This demonstrates peripheral heterogeneous enhancement following contrast, measuring approximately 5.6 x 3.7 cm transverse on image 23 of series 1201. There is inferior extension of tumor along the posteromedial hepatic capsule. There is extrinsic mass effect on the IVC which is narrowed, but patent. The hepatic and portal veins are patent. The dominant liver lesion demonstrates central cystic or necrotic components which do not enhance.  No other focal liver lesions are identified. There is stable mild intrahepatic biliary dilatation status post choledochojejunostomy. There is stable atrophy of the pancreatic body and tail. Soft tissue stranding and enhancement surrounding the duodenum and IVC are similar to the prior examination, likely postsurgical in etiology.  The spleen, adrenal glands and kidneys demonstrate no significant findings. There is no hydronephrosis. There is upper abdominal adenopathy within the portacaval space and retroperitoneum. There is a 1.4 cm aortic caval node which demonstrates central necrosis. There is a new enhancing 11 mm nodule inferior to the right hepatic lobe which may reflect a small peritoneal implant. No significant ascites is seen.  IMPRESSION: 1. As demonstrated on recent Chest CT, there is a new peripherally enhancing mass posteromedially within the right hepatic lobe, highly worrisome for local recurrence or metastatic disease from the patient's bile duct adenocarcinoma. A liver abscess could have this appearance, although that possibility is considered much less likely. 2. Associated necrotic upper abdominal adenopathy, also worrisome for metastatic disease. Possible small peritoneal implant adjacent to the right hepatic lobe. 3. No significant biliary  dilatation. 4. Extrinsic mass effect on the IVC without evidence of occlusion.   Electronically Signed   By: Camie Patience M.D.   On: 11/25/2013 18:37   Ct Abdomen Pelvis W Contrast  12/03/2013   CLINICAL DATA:  History of Whipple procedure for bile duct carcinoma. New sepsis. New liver lesion  EXAM: CT ABDOMEN AND PELVIS WITH CONTRAST  TECHNIQUE: Multidetector CT imaging of the abdomen and pelvis was performed using the standard protocol following bolus administration of intravenous contrast.  CONTRAST:  150mL OMNIPAQUE IOHEXOL 300 MG/ML  SOLN  COMPARISON:  MR ABDOMEN WO/W CM dated 11/25/2013  FINDINGS: Lung bases are clear.  No pericardial fluid.  Again demonstrated a large subcapsular lesion within the posterior right hepatic lobe measuring 4.5 x 4.5 cm similar to 4.8 x 4.3 cm on comparison MRI. This lesion continues demonstrate mild peripheral enhancement and central heterogeneity. No additional hepatic lesions. There is pneumobilia in the left hepatic lobe consistent with choledochojejunostomy. There is some edema involving the bowel along the porta hepatis and small amount of fluid small of fluid along Morrison's pouch is similar prior.  Small nodule inferior right hepatic lobe measuring 8 mm unchanged (image 31, series 2 ). The body and tail of the pancreas normal. The spleen, adrenal glands, kidneys are normal. There is some cortical thinning on the right.  Small bowel and colon are unremarkable. There is no evidence of abscess in the pelvis.  Post hysterectomy anatomy. The bladder is normal. No pelvic lymphadenopathy. No aggressive osseous lesion.  IMPRESSION: 1. No change in large enhancing lesion the posterior aspect of the right hepatic lobe. In patient with sepsis of unknown origin would consider hepatic abscess as a distinct possibility although adenocarcinoma recurrence cannot be excluded. Recommend ultrasound guided percutaneous biopsy of this lesion to differentiate between abscess and malignancy.  Favor abscess. 2. Edema along the proximal bowel and Morrison's pouch likely related to the hepatic process. 3. Patient status post a Whipple procedure without complicating  features. This was made a call report.   Electronically Signed   By: Suzy Bouchard M.D.   On: 12/03/2013 20:18   US Biopsy  12/10/2013   CLINICAL DATA:  Right lobe liver lesion. 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 Request has been made to obtain more tissue for areas culture studies.  EXAM: ULTRASOUND GUIDED ASPIRATE AND CORE BIOPSY OF LIVER  MEDICATIONS: 2.0 mg IV Versed; 100 mcg IV Fentanyl  Total Moderate Sedation Time: 25 min  PROCEDURE: The procedure, risks, benefits, and alternatives were explained to the patient. Questions regarding the procedure were encouraged and answered. The patient understands and consents to the procedure.  The right abdominal wall was prepped with chlorhexidine in a sterile fashion, and a sterile drape was applied covering the operative field. A sterile gown and sterile gloves were used for the procedure. Local anesthesia was provided with 1% Lidocaine.  Under CT guidance, a 19 gauge trocar needle was advanced to the level of the posterior right hepatic lesion previously sampled. Both 22 gauge and 20 gauge Chiba needle aspirate samples were obtained. A total of 3 FNA samples were combined with sterile saline. Two separate 20 gauge core biopsy samples were also obtained and combined with the aspirate samples in sterile saline. The outer needle was then removed.  COMPLICATIONS: None.  FINDINGS: Multiple FNA and core biopsy samples were combined, as above for a variety of requested culture studies.  IMPRESSION: Ultrasound-guided needle aspirate and core biopsy of right hepatic lesion for culture studies.   Electronically Signed   By: Aletta Edouard M.D.   On: 12/10/2013 17:43   US Biopsy  12/04/2013   CLINICAL DATA:  58 year old with history of bile duct cancer and new  liver lesion. Patient has an elevated white count and chills. Evaluate for recurrent cancer versus abscess.  EXAM: ULTRASOUND GUIDED BIOPSY OF THE LIVER LESION  MEDICATIONS: 2.0 mg IV Versed; 100 mcg IV Fentanyl. A radiology nurse monitored the patient throughout the procedure.  Total Moderate Sedation Time: 25 min  PROCEDURE: The procedure, risks, benefits, and alternatives were explained to the patient. Questions regarding the procedure were encouraged and answered. The patient understands and consents to the procedure.  The right upper abdomen was prepped with chlorhexidine. A sterile drape was placed. The skin and liver capsule were anesthetized with 1% lidocaine. A 17 gauge needle was directed into the posterior right hepatic lesion and tried to aspirate fluid from the 17 gauge needle. No purulent fluid was obtained. A small amount of bloody fluid was collected. Three core biopsies were obtained with an 18 gauge device. Specimens were placed in formalin. Needle removed without complication.  COMPLICATIONS: None.  FINDINGS: Heterogeneous hypoechoic lesion in the posterior right hepatic lobe. Needle placement was confirmed within the lesion. Following the first core biopsy, there was development of multiple echogenic foci throughout the lesion. Echogenic foci probably represent gas from the biliary system. No significant bleeding.  IMPRESSION: Ultrasound-guided core biopsies of the liver lesion. Solid core material was obtained from the biopsies and no purulent fluid could be obtained. Findings are suspicious for recurrent cancer rather than abscess. A sample of the aspirated blood was sent for Gram stain and culture.   Electronically Signed   By: Markus Daft M.D.   On: 12/04/2013 16:36     Microbiology: Recent Results (from the past 240 hour(s))  CULTURE, ROUTINE-ABSCESS     Status: None   Collection Time    12/04/13  4:19 PM  Result Value Range Status   Specimen Description LIVER   Final   Special  Requests NONE   Final   Gram Stain     Final   Value: NO WBC SEEN     NO SQUAMOUS EPITHELIAL CELLS SEEN     NO ORGANISMS SEEN     Performed at [facility]   Culture     Final   Value: NO GROWTH 2 DAYS     Performed at [facility]   Report Status 12/08/2013 FINAL   Final  CULTURE, EXPECTORATED SPUTUM-ASSESSMENT     Status: None   Collection Time    12/08/13 12:28 PM      Result Value Range Status   Specimen Description SPUTUM   Final   Special Requests NONE   Final   Sputum evaluation     Final   Value: MICROSCOPIC FINDINGS SUGGEST THAT THIS SPECIMEN IS NOT REPRESENTATIVE OF LOWER RESPIRATORY SECRETIONS. PLEASE RECOLLECT.     INFORMED DANA RN AT [id number] ON 01.21.15 BY SHUEA   Report Status 12/08/2013 FINAL   Final  CULTURE, EXPECTORATED SPUTUM-ASSESSMENT     Status: None   Collection Time    12/09/13  7:16 AM      Result Value Range Status   Specimen Description SPUTUM   Final   Special Requests Normal   Final   Sputum evaluation     Final   Value: MICROSCOPIC FINDINGS SUGGEST THAT THIS SPECIMEN IS NOT REPRESENTATIVE OF LOWER RESPIRATORY SECRETIONS. PLEASE RECOLLECT.     INFORMED DANDA DARKE RN AT [id number] ON 01.22.15 BY SHUEA   Report Status 12/09/2013 FINAL   Final  FUNGUS CULTURE, BLOOD     Status: None   Collection Time    12/10/13  5:00 AM      Result Value Range Status   Specimen Description BLOOD PICC   Final   Special Requests 5CC   Final   Culture     Final   Value: NO FUNGUS ISOLATED;CULTURE IN PROGRESS FOR 7 DAYS     Performed at [facility]   Report Status PENDING   Incomplete  FUNGUS CULTURE W SMEAR     Status: None   Collection Time    12/10/13  2:13 PM      Result Value Range Status   Specimen Description LIVER   Final   Special Requests NONE   Final   Fungal Smear     Final   Value: NO YEAST OR FUNGAL ELEMENTS SEEN     Performed at [facility]   Culture     Final   Value: CULTURE IN PROGRESS FOR FOUR WEEKS     Performed at  [facility]   Report Status PENDING   Incomplete  TISSUE CULTURE     Status: None   Collection Time    12/10/13  2:19 PM      Result Value Range Status   Specimen Description OTHER LIVER    Final   Special Requests Normal   Final   Gram Stain     Final   Value: RARE WBC PRESENT,BOTH PMN AND MONONUCLEAR     NO SQUAMOUS EPITHELIAL CELLS SEEN     NO ORGANISMS SEEN     Performed at [facility]   Culture     Final   Value: NO GROWTH 3 DAYS     Performed at [facility]   Report Status 12/13/2013 FINAL   Final  AFB CULTURE WITH SMEAR     Status: None   Collection Time    12/10/13  2:19 PM      Result Value Range Status   Specimen Description ABSCESS TISSUE LIVER   Final   Special Requests Normal   Final   ACID FAST SMEAR     Final   Value: NO ACID FAST BACILLI SEEN     Performed at [facility]   Culture     Final   Value: CULTURE WILL BE EXAMINED FOR 6 WEEKS BEFORE ISSUING A FINAL REPORT     Performed at [facility]   Report Status PENDING   Incomplete  ANAEROBIC CULTURE     Status: None   Collection Time    12/10/13  2:19 PM      Result Value Range Status   Specimen Description LIVER   Final   Special Requests Normal   Final   Gram Stain     Final   Value: RARE WBC PRESENT,BOTH PMN AND MONONUCLEAR     NO SQUAMOUS EPITHELIAL CELLS SEEN     NO ORGANISMS SEEN     Performed at [facility]   Culture     Final   Value: NO ANAEROBES ISOLATED; CULTURE IN PROGRESS FOR 5 DAYS     Performed at [facility]   Report Status PENDING   Incomplete  AFB CULTURE, BLOOD     Status: None   Collection Time    12/11/13  4:49 AM      Result Value Range Status   Specimen Description BLOOD RIGHT ARM PICC   Final   Special Requests ACID FAST BACILLI [place]   Final   Culture     Final   Value: CULTURE WILL BE EXAMINED FOR 6 WEEKS BEFORE ISSUING A FINAL REPORT     Performed at [facility]   Report Status PENDING   Incomplete      Labs: Basic Metabolic Panel:  Recent Labs Lab 12/08/13 0640 12/09/13 0540 12/11/13 0449 12/14/13 0500  NA 135* 140 140 140  K 4.0 3.6* 3.4* 3.9  CL 100 104 102 103  CO2 25 27 29 27   GLUCOSE 197* 151* 156* 144*  BUN 7 7 11 10   CREATININE 0.85 0.92 0.86 0.87  CALCIUM 8.3* 8.3* 8.5 8.5  MG  --   --  2.2  --    Liver Function Tests: No results found for this basename: AST, ALT, ALKPHOS, BILITOT, PROT, ALBUMIN,  in the last 168 hours No results found for this basename: LIPASE, AMYLASE,  in the last 168 hours No results found for this basename: AMMONIA,  in the last 168 hours CBC:  Recent Labs Lab 12/08/13 0640 12/09/13 0540 12/11/13 0449 12/13/13 0520  WBC 11.4* 6.4 7.1 9.5  NEUTROABS  --   --   --  6.5  HGB 10.7* 9.6* 9.7* 9.4*  HCT 33.0* 31.0* 30.6* 29.6*  MCV 87.5 89.9 89.7 89.4  PLT 265 268 281 300   Cardiac Enzymes: No results found for this basename: CKTOTAL, CKMB, CKMBINDEX, TROPONINI,  in the last 168 hours BNP: No components found with this basename: POCBNP,  CBG:  Recent Labs Lab 12/13/13 0748 12/13/13 1213 12/13/13 1756 12/13/13 2119 12/14/13 0804  GLUCAP 140* 246* 216* 227* 127*    Time coordinating discharge:  Greater than 30 minutes  Signed:  Jerriyah Louis, DO [facility] Pager: [phone] 12/14/2013, 11:28 AM

## 2013-12-14 NOTE — Progress Notes (Signed)
[facility] for Infectious Disease  Date of Admission:  12/03/2013  Antibiotics: Antibiotics Given (last 72 hours)   None      Subjective: No new event, low grade temp up to 100.2  Objective: Temp:  [98.8 F (37.1 C)-100.2 F (37.9 C)] 100.2 F (37.9 C) (01/27 0903) Pulse Rate:  [84-104] 104 (01/27 0903) Resp:  [18-20] 18 (01/27 0903) BP: (137-153)/(80-87) 144/81 mmHg (01/27 0903) SpO2:  [96 %-100 %] 98 % (01/27 0903)  General: al;ert, nad   Lab Results Lab Results  Component Value Date   WBC 9.5 12/13/2013   HGB 9.4* 12/13/2013   HCT 29.6* 12/13/2013   MCV 89.4 12/13/2013   PLT 300 12/13/2013    Lab Results  Component Value Date   CREATININE 0.87 12/14/2013   BUN 10 12/14/2013   NA 140 12/14/2013   K 3.9 12/14/2013   CL 103 12/14/2013   CO2 27 12/14/2013    Lab Results  Component Value Date   ALT 15 12/05/2013   AST 19 12/05/2013   ALKPHOS 123* 12/05/2013   BILITOT 0.3 12/05/2013      Microbiology: Recent Results (from the past 240 hour(s))  CULTURE, ROUTINE-ABSCESS     Status: None   Collection Time    12/04/13  4:19 PM      Result Value Range Status   Specimen Description LIVER   Final   Special Requests NONE   Final   Gram Stain     Final   Value: NO WBC SEEN     NO SQUAMOUS EPITHELIAL CELLS SEEN     NO ORGANISMS SEEN     Performed at [facility]   Culture     Final   Value: NO GROWTH 2 DAYS     Performed at [facility]   Report Status 12/08/2013 FINAL   Final  CULTURE, EXPECTORATED SPUTUM-ASSESSMENT     Status: None   Collection Time    12/08/13 12:28 PM      Result Value Range Status   Specimen Description SPUTUM   Final   Special Requests NONE   Final   Sputum evaluation     Final   Value: MICROSCOPIC FINDINGS SUGGEST THAT THIS SPECIMEN IS NOT REPRESENTATIVE OF LOWER RESPIRATORY SECRETIONS. PLEASE RECOLLECT.     INFORMED DANA RN AT [id number] ON 01.21.15 BY SHUEA   Report Status 12/08/2013 FINAL   Final  CULTURE, EXPECTORATED  SPUTUM-ASSESSMENT     Status: None   Collection Time    12/09/13  7:16 AM      Result Value Range Status   Specimen Description SPUTUM   Final   Special Requests Normal   Final   Sputum evaluation     Final   Value: MICROSCOPIC FINDINGS SUGGEST THAT THIS SPECIMEN IS NOT REPRESENTATIVE OF LOWER RESPIRATORY SECRETIONS. PLEASE RECOLLECT.     INFORMED DANDA DARKE RN AT [id number] ON 01.22.15 BY SHUEA   Report Status 12/09/2013 FINAL   Final  FUNGUS CULTURE, BLOOD     Status: None   Collection Time    12/10/13  5:00 AM      Result Value Range Status   Specimen Description BLOOD PICC   Final   Special Requests 5CC   Final   Culture     Final   Value: NO FUNGUS ISOLATED;CULTURE IN PROGRESS FOR 7 DAYS     Performed at [facility]   Report Status PENDING   Incomplete  FUNGUS CULTURE W SMEAR  Status: None   Collection Time    12/10/13  2:13 PM      Result Value Range Status   Specimen Description LIVER   Final   Special Requests NONE   Final   Fungal Smear     Final   Value: NO YEAST OR FUNGAL ELEMENTS SEEN     Performed at [facility]   Culture     Final   Value: CULTURE IN PROGRESS FOR FOUR WEEKS     Performed at [facility]   Report Status PENDING   Incomplete  TISSUE CULTURE     Status: None   Collection Time    12/10/13  2:19 PM      Result Value Range Status   Specimen Description OTHER LIVER    Final   Special Requests Normal   Final   Gram Stain     Final   Value: RARE WBC PRESENT,BOTH PMN AND MONONUCLEAR     NO SQUAMOUS EPITHELIAL CELLS SEEN     NO ORGANISMS SEEN     Performed at [facility]   Culture     Final   Value: NO GROWTH 3 DAYS     Performed at [facility]   Report Status 12/13/2013 FINAL   Final  AFB CULTURE WITH SMEAR     Status: None   Collection Time    12/10/13  2:19 PM      Result Value Range Status   Specimen Description ABSCESS TISSUE LIVER   Final   Special Requests Normal   Final   ACID FAST SMEAR      Final   Value: NO ACID FAST BACILLI SEEN     Performed at [facility]   Culture     Final   Value: CULTURE WILL BE EXAMINED FOR 6 WEEKS BEFORE ISSUING A FINAL REPORT     Performed at [facility]   Report Status PENDING   Incomplete  ANAEROBIC CULTURE     Status: None   Collection Time    12/10/13  2:19 PM      Result Value Range Status   Specimen Description LIVER   Final   Special Requests Normal   Final   Gram Stain     Final   Value: RARE WBC PRESENT,BOTH PMN AND MONONUCLEAR     NO SQUAMOUS EPITHELIAL CELLS SEEN     NO ORGANISMS SEEN     Performed at [facility]   Culture     Final   Value: NO ANAEROBES ISOLATED; CULTURE IN PROGRESS FOR 5 DAYS     Performed at [facility]   Report Status PENDING   Incomplete  AFB CULTURE, BLOOD     Status: None   Collection Time    12/11/13  4:49 AM      Result Value Range Status   Specimen Description BLOOD RIGHT ARM PICC   Final   Special Requests ACID FAST BACILLI [place]   Final   Culture     Final   Value: CULTURE WILL BE EXAMINED FOR 6 WEEKS BEFORE ISSUING A FINAL REPORT     Performed at [facility]   Report Status PENDING   Incomplete    Studies/Results: No results found.  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  Histo Ag negative, Quantiferon negative.  Other labs pending.  Cultures ngtd.   -we will arrange follow up with her in about 2 weeks -  for fever, treat with ibuprofen, acetaminophen  [name], [facility] [place] [email or web address] [phone] pager   [phone] cell 12/14/2013, 2:28 PM

## 2013-12-14 NOTE — Progress Notes (Signed)
Pt Temp 100.2, BP 144/81, HR 104 at 0900.  Pt in restroom HR 140.  MD notified.

## 2013-12-14 NOTE — Progress Notes (Signed)
Per MD order, PICC line removed. Cath intact at 38cm. Vaseline pressure gauze to site, pressure held x 5min. No bleeding to site. Pt instructed to keep dressing CDI x 24 hours. Avoid heavy lifting, pushing or pulling x 24 hours,  If bleeding occurs hold pressure, if bleeding does not stop contact MD or go to the ED. Pt does not have any questions. Yanuel Tagg M  

## 2013-12-15 LAB — ANAEROBIC CULTURE: Special Requests: NORMAL

## 2013-12-15 LAB — BARTONELLA ANITBODY PANEL
B HENSELAE IGG: NEGATIVE
B henselae IgM: NEGATIVE

## 2013-12-15 LAB — BARTONELLA ANTIBODY PANEL

## 2013-12-16 ENCOUNTER — Other Ambulatory Visit: Payer: Self-pay | Admitting: Internal Medicine

## 2013-12-16 LAB — Q FEVER ABS IGG, IGM W/ REFLEX TITER
Q FEVER IGG PHASE I SCREEN 1: NEGATIVE
Q FEVER IGG PHASE II SCREEN 1: NEGATIVE
Q FEVER IGM PHASE I SCREEN 1: NEGATIVE
Q fever IgM phase II screen 1: POSITIVE — AB

## 2013-12-16 LAB — Q FEVER IGM PHASE 2 TITER (REFLEX): Q fever IgM phase 2 titer: 1:16 {titer} — ABNORMAL HIGH

## 2013-12-17 LAB — FUNGUS CULTURE, BLOOD: Culture: NO GROWTH

## 2013-12-24 ENCOUNTER — Other Ambulatory Visit: Payer: Self-pay | Admitting: Internal Medicine

## 2013-12-24 ENCOUNTER — Encounter: Payer: Self-pay | Admitting: Internal Medicine

## 2013-12-24 ENCOUNTER — Telehealth: Payer: Self-pay | Admitting: Internal Medicine

## 2013-12-24 ENCOUNTER — Ambulatory Visit (INDEPENDENT_AMBULATORY_CARE_PROVIDER_SITE_OTHER): Payer: BC Managed Care – PPO | Admitting: Internal Medicine

## 2013-12-24 VITALS — BP 130/84 | HR 80 | Temp 100.4°F | Resp 18 | Ht 67.0 in | Wt 157.8 lb

## 2013-12-24 DIAGNOSIS — Z1212 Encounter for screening for malignant neoplasm of rectum: Secondary | ICD-10-CM

## 2013-12-24 DIAGNOSIS — R7401 Elevation of levels of liver transaminase levels: Secondary | ICD-10-CM

## 2013-12-24 DIAGNOSIS — Z113 Encounter for screening for infections with a predominantly sexual mode of transmission: Secondary | ICD-10-CM

## 2013-12-24 DIAGNOSIS — R7402 Elevation of levels of lactic acid dehydrogenase (LDH): Secondary | ICD-10-CM

## 2013-12-24 DIAGNOSIS — E559 Vitamin D deficiency, unspecified: Secondary | ICD-10-CM

## 2013-12-24 DIAGNOSIS — R740 Nonspecific elevation of levels of transaminase and lactic acid dehydrogenase [LDH]: Secondary | ICD-10-CM

## 2013-12-24 DIAGNOSIS — E119 Type 2 diabetes mellitus without complications: Secondary | ICD-10-CM

## 2013-12-24 DIAGNOSIS — E782 Mixed hyperlipidemia: Secondary | ICD-10-CM

## 2013-12-24 DIAGNOSIS — R509 Fever, unspecified: Secondary | ICD-10-CM

## 2013-12-24 DIAGNOSIS — Z79899 Other long term (current) drug therapy: Secondary | ICD-10-CM

## 2013-12-24 DIAGNOSIS — Z0000 Encounter for general adult medical examination without abnormal findings: Secondary | ICD-10-CM

## 2013-12-24 DIAGNOSIS — I10 Essential (primary) hypertension: Secondary | ICD-10-CM

## 2013-12-24 LAB — TSH: TSH: 1.398 u[IU]/mL (ref 0.350–4.500)

## 2013-12-24 LAB — CBC WITH DIFFERENTIAL/PLATELET
BASOS ABS: 0 10*3/uL (ref 0.0–0.1)
BASOS PCT: 0 % (ref 0–1)
Eosinophils Absolute: 0 10*3/uL (ref 0.0–0.7)
Eosinophils Relative: 0 % (ref 0–5)
HEMATOCRIT: 35 % — AB (ref 36.0–46.0)
HEMOGLOBIN: 11.7 g/dL — AB (ref 12.0–15.0)
Lymphocytes Relative: 7 % — ABNORMAL LOW (ref 12–46)
Lymphs Abs: 1.3 10*3/uL (ref 0.7–4.0)
MCH: 28.1 pg (ref 26.0–34.0)
MCHC: 33.4 g/dL (ref 30.0–36.0)
MCV: 84.1 fL (ref 78.0–100.0)
MONO ABS: 1.1 10*3/uL — AB (ref 0.1–1.0)
Monocytes Relative: 6 % (ref 3–12)
NEUTROS ABS: 15.3 10*3/uL — AB (ref 1.7–7.7)
Neutrophils Relative %: 87 % — ABNORMAL HIGH (ref 43–77)
Platelets: 501 10*3/uL — ABNORMAL HIGH (ref 150–400)
RBC: 4.16 MIL/uL (ref 3.87–5.11)
RDW: 14.6 % (ref 11.5–15.5)
WBC: 17.7 10*3/uL — ABNORMAL HIGH (ref 4.0–10.5)

## 2013-12-24 LAB — LIPID PANEL
Cholesterol: 110 mg/dL (ref 0–200)
HDL: 34 mg/dL — AB (ref >39)
LDL Cholesterol: 45 mg/dL (ref 0–99)
Total CHOL/HDL Ratio: 3.2 Ratio
Triglycerides: 154 mg/dL — ABNORMAL HIGH (ref <150)
VLDL: 31 mg/dL (ref 0–40)

## 2013-12-24 LAB — BASIC METABOLIC PANEL WITH GFR
BUN: 13 mg/dL (ref 6–23)
CHLORIDE: 98 meq/L (ref 96–112)
CO2: 29 mEq/L (ref 19–32)
Calcium: 9.4 mg/dL (ref 8.4–10.5)
Creat: 1 mg/dL (ref 0.50–1.10)
GFR, EST NON AFRICAN AMERICAN: 62 mL/min
GFR, Est African American: 72 mL/min
Glucose, Bld: 93 mg/dL (ref 70–99)
Potassium: 4.3 mEq/L (ref 3.5–5.3)
Sodium: 136 mEq/L (ref 135–145)

## 2013-12-24 LAB — PROTIME-INR
INR: 1 (ref <1.50)
PROTHROMBIN TIME: 13.1 s (ref 11.6–15.2)

## 2013-12-24 LAB — HEPATIC FUNCTION PANEL
ALT: 20 U/L (ref 0–35)
AST: 30 U/L (ref 0–37)
Albumin: 3.6 g/dL (ref 3.5–5.2)
Alkaline Phosphatase: 124 U/L — ABNORMAL HIGH (ref 39–117)
BILIRUBIN INDIRECT: 0.2 mg/dL (ref 0.2–1.2)
Bilirubin, Direct: 0.1 mg/dL (ref 0.0–0.3)
Total Bilirubin: 0.3 mg/dL (ref 0.2–1.2)
Total Protein: 6.8 g/dL (ref 6.0–8.3)

## 2013-12-24 LAB — MAGNESIUM: MAGNESIUM: 2 mg/dL (ref 1.5–2.5)

## 2013-12-24 LAB — VITAMIN B12: Vitamin B-12: 1340 pg/mL — ABNORMAL HIGH (ref 211–911)

## 2013-12-24 MED ORDER — DOXYCYCLINE HYCLATE 100 MG PO TABS: 100.0000 mg | ORAL_TABLET | Freq: Two times a day (BID) | ORAL | Status: DC

## 2013-12-24 NOTE — Progress Notes (Signed)
Patient ID: Courtney Stevens, female   DOB: 04-Mar-1956, 58 y.o.   MRN: [id number]  Annual Screening Comprehensive Examination  This very nice 58 y.o. female presents for complete physical.  Patient has been followed for HTN, Diabetes  Prediabetes, Hyperlipidemia, and Vitamin D Deficiency. 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 Patient was recently hospitalized twice and most recently 1/16-27/2015 with SIRS and was found to a large enhancing lesion of the Rt hepatic lobe and by biopsy was Dx'd as a granuloma. Initially she was treated with Vanc/Zosyn and ultimately D/C'd on Levaquin and Prednisone for 3 days and now off approx 1 week. At the recommendation of ID consultant, she has been monitoring bid temps and has been having temp spikes up to 101-103 degrees. She reports nausea, occas emesis, chills, diaphoresis Rt chest and shoulder pains, occas yellowish sputum still having night sweats and numbness of the fingertips for one hour this am.    HTN predates since 1989. Today's BP: 130/84 mmHg. Patient denies any cardiac symptoms as exertional chest pains, palpitations, shortness of breath, dizziness or ankle swelling.   Patient's hyperlipidemia is controlled with diet. Patient denies myalgias or other medication SE's. Last cholesterol last visit was 110, triglycerides 83, HDL 56 and LDL 37.     Patient has T2 NIDDM since 2000 with last A1c 6.3% in Aug 2014 . Patient denies reactive hypoglycemic symptoms, visual blurring, diabetic polys, or paresthesias.   Finally, patient has history of Vitamin D Deficiency of 28 in 2008 with last vitamin D 55 in 2013.       Medication List         ALPRAZolam 1 MG tablet  Commonly known as:  XANAX  Take 1 tablet (1 mg total) by mouth at bedtime as needed. anxiety     calcium citrate-vitamin D 315-200 MG-UNIT per tablet  Commonly known as:  CITRACAL+D  Take 1 tablet by mouth 3 (three) times daily.      cetirizine 10 MG tablet  Commonly known as:  ZYRTEC  Take 10 mg by mouth every morning.     diphenhydrAMINE 25 MG tablet  Commonly known as:  BENADRYL  Take 25 mg by mouth every 6 (six) hours as needed.     doxycycline 100 MG tablet  Commonly known as:  VIBRA-TABS  Take 1 tablet (100 mg total) by mouth 2 (two) times daily.     furosemide 40 MG tablet  Commonly known as:  LASIX  Take 40 mg by mouth 2 (two) times daily.     glyBURIDE 5 MG tablet  Commonly known as:  DIABETA  Take 2 tablets (10 mg total) by mouth daily with breakfast.     HYDROcodone-acetaminophen 5-325 MG per tablet  Commonly known as:  NORCO/VICODIN  Take 1 tablet by mouth every 6 (six) hours as needed for moderate pain.     IRON COMPLEX PO  Take 1 tablet by mouth daily. Pt takes 65mg  iron     levothyroxine 200 MCG tablet  Commonly known as:  SYNTHROID, LEVOTHROID  TAKE 1 TABLET BY MOUTH EVERY DAY     lipase/protease/amylase 12000 UNITS Cpep capsule  Commonly known as:  CREON-12/PANCREASE  Take 1 capsule by mouth 3 (three) times daily before meals.     Magnesium 250 MG Tabs  Take 1 tablet by mouth 4 (four) times daily.     metFORMIN 500 MG tablet  Commonly known as:  GLUCOPHAGE  Take 500 mg by mouth 3 (three) times daily with meals. One tab at breakfast, one tab at lunch and two tabs for dinner     multivitamin with minerals tablet  Take 1 tablet by mouth as needed.     omeprazole 40 MG capsule  Commonly known as:  PRILOSEC  Take 40 mg by mouth 2 (two) times daily.     potassium chloride SA 20 MEQ tablet  Commonly known as:  K-DUR,KLOR-CON  Take 20 mEq by mouth 2 (two) times daily.     Vitamin D (Ergocalciferol) 50000 UNITS Caps capsule  Commonly known as:  DRISDOL  Take 50,000 Units by mouth every evening.        Allergies  Allergen Reactions  . Ace Inhibitors Anaphylaxis  . Tilade [Nedocromil] Anaphylaxis  . Reglan [Metoclopramide] Nausea And Vomiting  . Betadine [Povidone  Iodine] Rash    iching     Past Medical History  Diagnosis Date  . Cancer     bile duct ca  . GERD (gastroesophageal reflux disease)   . Diabetes mellitus   . Hypertension   . Hypothyroid   . Hyperlipidemia   . Asthma     Past Surgical History  Procedure Laterality Date  . Gastric bypass  2004  . Cholecystectomy  1992  . Bile duct resection  2009  . Appendectomy    . Tonsillectomy    . Abdominal hysterectomy  1990    BIL OOPHORECTOMY  . Tubal ligation    . Breast surgery      BIOPSY NEG    Family History  Problem Relation Age of Onset  . Colon cancer Mother   . Hypertension Mother   . Glaucoma Mother   . Diabetes Father   . Alzheimer's disease Father   . Heart attack Father   . Nephrolithiasis Father   . Emphysema Father     was a smoker  . Heart disease Father   . Nephrolithiasis Sister   . Heart attack Brother   . Nephrolithiasis Brother     History  Substance Use Topics  . Smoking status: Former Smoker -- 2.00 packs/day for 5 years    Types: Cigarettes    Quit date: 11/18/1977  . Smokeless tobacco: Never Used  . Alcohol Use: Yes     Comment: 2-3 glasses of wine once per month     ROS Constitutional: Denies fever, chills, weight loss/gain, headaches, insomnia, fatigue, night sweats, and change in appetite. Eyes: Denies redness, blurred vision, diplopia, discharge, itchy, watery eyes.  ENT: Denies discharge, congestion, post nasal drip, epistaxis, sore throat, earache, hearing loss, dental pain, Tinnitus, Vertigo, Sinus pain, snoring.  Cardio: Denies chest pain, palpitations, irregular heartbeat, syncope, dyspnea, diaphoresis, orthopnea, PND, claudication, edema Respiratory: denies cough, dyspnea, DOE, pleurisy, hoarseness, laryngitis, wheezing.  Gastrointestinal: Denies dysphagia, heartburn, reflux, water brash, pain, cramps, nausea, vomiting, bloating, diarrhea, constipation, hematemesis, melena, hematochezia, jaundice, hemorrhoids Genitourinary:  Denies dysuria, frequency, urgency, nocturia, hesitancy, discharge, hematuria, flank pain Breast:Breast lumps, nipple discharge, bleeding.  Musculoskeletal: Denies arthralgia, myalgia, stiffness, Jt. Swelling, pain, limp, and strain/sprain. Skin: Denies puritis, rash, hives, warts, acne, eczema, changing in skin lesion Neuro: No weakness, tremor, incoordination, spasms, paresthesia, pain Psychiatric: Denies confusion, memory loss, sensory loss Endocrine: Denies change in weight, skin, hair change, nocturia, and paresthesia, diabetic polys, visual blurring, hyper / hypo glycemic episodes.  Heme/Lymph: No excessive bleeding, bruising, enlarged lymph nodes.  BP: 130/84  Pulse: 80  Temp: 100.4 F (38 C)  Resp:  18    Estimated body mass index is 24.71 kg/(m^2) as calculated from the following:   Height as of this encounter: 5\' 7"  (1.702 m).   Weight as of this encounter: 157 lb 12.8 oz (71.578 kg).  Physical Exam General Appearance: Well nourished, in no apparent distress. Eyes: PERRLA, EOMs, conjunctiva no swelling or erythema, normal fundi and vessels. Sinuses: No frontal/maxillary tenderness ENT/Mouth: EACs patent / TMs  nl. Nares clear without erythema, swelling, mucoid exudates. Oral hygiene is good. No erythema, swelling, or exudate. Tongue normal, non-obstructing. Tonsils not swollen or erythematous. Hearing normal.  Neck: Supple, thyroid normal. No bruits, nodes or JVD. Respiratory: Respiratory effort normal.  BS equal and clear bilateral without rales, rhonci, wheezing or stridor. Cardio: Heart sounds are normal with regular rate and rhythm and no murmurs, rubs or gallops. Peripheral pulses are normal and equal bilaterally without edema. No aortic or femoral bruits. Chest: symmetric with normal excursions and percussion. Breasts: Symmetric, without lumps, nipple discharge, retractions, or fibrocystic changes.  Abdomen: Flat, soft, with bowl sounds. Nontender, no guarding, rebound,  hernias, masses, or organomegaly.  Lymphatics: Non tender without lymphadenopathy.  Musculoskeletal: Full ROM all peripheral extremities, joint stability, 5/5 strength, and normal gait. Skin: Warm and dry without rashes, lesions, cyanosis, clubbing or  ecchymosis.  Neuro: Cranial nerves intact, reflexes equal bilaterally. Normal muscle tone, no cerebellar symptoms. Sensation intact.  Pysch: Awake and oriented X 3, normal affect, Insight and Judgment appropriate.   Assessment and Plan  1. Annual Screening Examination 2. Hypertension  3. Hyperlipidemia 4. Pre Diabetes 5. Vitamin D Deficiency 6. Hx/o [place] Tumor/Bile Duct Ca s/p Whipple in 2009 7. FUO - advise patient if she persists with fever spikes to 103 deg that she should go tho ER to have Blood cultures done.  Continue prudent diet as discussed, weight control, BP monitoring, regular exercise, and medications. Discussed med's effects and SE's. Screening labs and tests as requested with regular follow-up as recommended.

## 2013-12-24 NOTE — Patient Instructions (Addendum)
CONTINUE TO MONITOR TEMPERATURES AND IF FEVER GREATER THAN 103 DEG - GO TO ER FOR BLOOD CULTURES    Fever, Adult A fever is a higher than normal body temperature. In an adult, an oral temperature around 98.6 F (37 C) is considered normal. A temperature of 100.4 F (38 C) or higher is generally considered a fever. Mild or moderate fevers generally have no long-term effects and often do not require treatment. Extreme fever (greater than or equal to 106 F or 41.1 C) can cause seizures. The sweating that may occur with repeated or prolonged fever may cause dehydration. Elderly people can develop confusion during a fever. A measured temperature can vary with:  Age.  Time of day.  Method of measurement (mouth, underarm, rectal, or ear). The fever is confirmed by taking a temperature with a thermometer. Temperatures can be taken different ways. Some methods are accurate and some are not.  An oral temperature is used most commonly. Electronic thermometers are fast and accurate.  An ear temperature will only be accurate if the thermometer is positioned as recommended by the manufacturer.  A rectal temperature is accurate and done for those adults who have a condition where an oral temperature cannot be taken.  An underarm (axillary) temperature is not accurate and not recommended. Fever is a symptom, not a disease.  CAUSES   Infections commonly cause fever.  Some noninfectious causes for fever include:  Some arthritis conditions.  Some thyroid or adrenal gland conditions.  Some immune system conditions.  Some types of cancer.  A medicine reaction.  High doses of certain street drugs such as methamphetamine.  Dehydration.  Exposure to high outside or room temperatures.  Occasionally, the source of a fever cannot be determined. This is sometimes called a "fever of unknown origin" (FUO).  Some situations may lead to a temporary rise in body temperature that may go away on  its own. Examples are:  Childbirth.  Surgery.  Intense exercise. HOME CARE INSTRUCTIONS   Take appropriate medicines for fever. Follow dosing instructions carefully. If you use acetaminophen to reduce the fever, be careful to avoid taking other medicines that also contain acetaminophen. Do not take aspirin for a fever if you are younger than age 59. There is an association with Reye's syndrome. Reye's syndrome is a rare but potentially deadly disease.  If an infection is present and antibiotics have been prescribed, take them as directed. Finish them even if you start to feel better.  Rest as needed.  Maintain an adequate fluid intake. To prevent dehydration during an illness with prolonged or recurrent fever, you may need to drink extra fluid.Drink enough fluids to keep your urine clear or pale yellow.  Sponging or bathing with room temperature water may help reduce body temperature. Do not use ice water or alcohol sponge baths.  Dress comfortably, but do not over-bundle. SEEK MEDICAL CARE IF:   You are unable to keep fluids down.  You develop vomiting or diarrhea.  You are not feeling at least partly better after 3 days.  You develop new symptoms or problems. SEEK IMMEDIATE MEDICAL CARE IF:   You have shortness of breath or trouble breathing.  You develop excessive weakness.  You are dizzy or you faint.  You are extremely thirsty or you are making little or no urine.  You develop new pain that was not there before (such as in the head, neck, chest, back, or abdomen).  You have persistant vomiting and diarrhea for more  than 1 to 2 days.  You develop a stiff neck or your eyes become sensitive to light.  You develop a skin rash.  You have a fever or persistent symptoms for more than 2 to 3 days.  You have a fever and your symptoms suddenly get worse. MAKE SURE YOU:   Understand these instructions.  Will watch your condition.  Will get help right away if you are  not doing well or get worse. Document Released: 04/30/2001 Document Revised: 01/27/2012 Document Reviewed: 09/05/2011 [facility] Patient Information 2014 [place], [place].     Hypertension As your heart beats, it forces blood through your arteries. This force is your blood pressure. If the pressure is too high, it is called hypertension (HTN) or high blood pressure. HTN is dangerous because you may have it and not know it. High blood pressure may mean that your heart has to work harder to pump blood. Your arteries may be narrow or stiff. The extra work puts you at risk for heart disease, stroke, and other problems.  Blood pressure consists of two numbers, a higher number over a lower, 110/72, for example. It is stated as "110 over 72." The ideal is below 120 for the top number (systolic) and under 80 for the bottom (diastolic). Write down your blood pressure today. You should pay close attention to your blood pressure if you have certain conditions such as:  Heart failure.  Prior heart attack.  Diabetes  Chronic kidney disease.  Prior stroke.  Multiple risk factors for heart disease. To see if you have HTN, your blood pressure should be measured while you are seated with your arm held at the level of the heart. It should be measured at least twice. A one-time elevated blood pressure reading (especially in the Emergency Department) does not mean that you need treatment. There may be conditions in which the blood pressure is different between your right and left arms. It is important to see your caregiver soon for a recheck. Most people have essential hypertension which means that there is not a specific cause. This type of high blood pressure may be lowered by changing lifestyle factors such as:  Stress.  Smoking.  Lack of exercise.  Excessive weight.  Drug/tobacco/alcohol use.  Eating less salt. Most people do not have symptoms from high blood pressure until it has caused damage to  the body. Effective treatment can often prevent, delay or reduce that damage. TREATMENT  When a cause has been identified, treatment for high blood pressure is directed at the cause. There are a large number of medications to treat HTN. These fall into several categories, and your caregiver will help you select the medicines that are best for you. Medications may have side effects. You should review side effects with your caregiver. If your blood pressure stays high after you have made lifestyle changes or started on medicines,   Your medication(s) may need to be changed.  Other problems may need to be addressed.  Be certain you understand your prescriptions, and know how and when to take your medicine.  Be sure to follow up with your caregiver within the time frame advised (usually within two weeks) to have your blood pressure rechecked and to review your medications.  If you are taking more than one medicine to lower your blood pressure, make sure you know how and at what times they should be taken. Taking two medicines at the same time can result in blood pressure that is too low. SEEK  IMMEDIATE MEDICAL CARE IF:  You develop a severe headache, blurred or changing vision, or confusion.  You have unusual weakness or numbness, or a faint feeling.  You have severe chest or abdominal pain, vomiting, or breathing problems. MAKE SURE YOU:   Understand these instructions.  Will watch your condition.  Will get help right away if you are not doing well or get worse.   Diabetes and Exercise Exercising regularly is important. It is not just about losing weight. It has many health benefits, such as:  Improving your overall fitness, flexibility, and endurance.  Increasing your bone density.  Helping with weight control.  Decreasing your body fat.  Increasing your muscle strength.  Reducing stress and tension.  Improving your overall health. People with diabetes who exercise gain  additional benefits because exercise:  Reduces appetite.  Improves the body's use of blood sugar (glucose).  Helps lower or control blood glucose.  Decreases blood pressure.  Helps control blood lipids (such as cholesterol and triglycerides).  Improves the body's use of the hormone insulin by:  Increasing the body's insulin sensitivity.  Reducing the body's insulin needs.  Decreases the risk for heart disease because exercising:  Lowers cholesterol and triglycerides levels.  Increases the levels of good cholesterol (such as high-density lipoproteins [HDL]) in the body.  Lowers blood glucose levels. YOUR ACTIVITY PLAN  Choose an activity that you enjoy and set realistic goals. Your health care provider or diabetes educator can help you make an activity plan that works for you. You can break activities into 2 or 3 sessions throughout the day. Doing so is as good as one long session. Exercise ideas include:  Taking the dog for a walk.  Taking the stairs instead of the elevator.  Dancing to your favorite song.  Doing your favorite exercise with a friend. RECOMMENDATIONS FOR EXERCISING WITH TYPE 1 OR TYPE 2 DIABETES   Check your blood glucose before exercising. If blood glucose levels are greater than 240 mg/dL, check for urine ketones. Do not exercise if ketones are present.  Avoid injecting insulin into areas of the body that are going to be exercised. For example, avoid injecting insulin into:  The arms when playing tennis.  The legs when jogging.  Keep a record of:  Food intake before and after you exercise.  Expected peak times of insulin action.  Blood glucose levels before and after you exercise.  The type and amount of exercise you have done.  Review your records with your health care provider. Your health care provider will help you to develop guidelines for adjusting food intake and insulin amounts before and after exercising.  If you take insulin or oral  hypoglycemic agents, watch for signs and symptoms of hypoglycemia. They include:  Dizziness.  Shaking.  Sweating.  Chills.  Confusion.  Drink plenty of water while you exercise to prevent dehydration or heat stroke. Body water is lost during exercise and must be replaced.  Talk to your health care provider before starting an exercise program to make sure it is safe for you. Remember, almost any type of activity is better than none.    Cholesterol Cholesterol is a white, waxy, fat-like protein needed by your body in small amounts. The liver makes all the cholesterol you need. It is carried from the liver by the blood through the blood vessels. Deposits (plaque) may build up on blood vessel walls. This makes the arteries narrower and stiffer. Plaque increases the risk for heart attack and stroke.  You cannot feel your cholesterol level even if it is very high. The only way to know is by a blood test to check your lipid (fats) levels. Once you know your cholesterol levels, you should keep a record of the test results. Work with your caregiver to to keep your levels in the desired range. WHAT THE RESULTS MEAN:  Total cholesterol is a rough measure of all the cholesterol in your blood.  LDL is the so-called bad cholesterol. This is the type that deposits cholesterol in the walls of the arteries. You want this level to be low.  HDL is the good cholesterol because it cleans the arteries and carries the LDL away. You want this level to be high.  Triglycerides are fat that the body can either burn for energy or store. High levels are closely linked to heart disease. DESIRED LEVELS:  Total cholesterol below 200.  LDL below 100 for people at risk, below 70 for very high risk.  HDL above 50 is good, above 60 is best.  Triglycerides below 150. HOW TO LOWER YOUR CHOLESTEROL:  Diet.  Choose fish or white meat chicken and [place], roasted or baked. Limit fatty cuts of red meat, fried foods,  and processed meats, such as sausage and lunch meat.  Eat lots of fresh fruits and vegetables. Choose whole grains, beans, pasta, potatoes and cereals.  Use only small amounts of olive, corn or canola oils. Avoid butter, mayonnaise, shortening or palm kernel oils. Avoid foods with trans-fats.  Use skim/nonfat milk and low-fat/nonfat yogurt and cheeses. Avoid whole milk, cream, ice cream, egg yolks and cheeses. Healthy desserts include angel food cake, ginger snaps, animal crackers, hard candy, popsicles, and low-fat/nonfat frozen yogurt. Avoid pastries, cakes, pies and cookies.  Exercise.  A regular program helps decrease LDL and raises HDL.  Helps with weight control.  Do things that increase your activity level like gardening, walking, or taking the stairs.  Medication.  May be prescribed by your caregiver to help lowering cholesterol and the risk for heart disease.  You may need medicine even if your levels are normal if you have several risk factors. HOME CARE INSTRUCTIONS   Follow your diet and exercise programs as suggested by your caregiver.  Take medications as directed.  Have blood work done when your caregiver feels it is necessary. MAKE SURE YOU:   Understand these instructions.  Will watch your condition.  Will get help right away if you are not doing well or get worse.      Vitamin D Deficiency Vitamin D is an important vitamin that your body needs. Having too little of it in your body is called a deficiency. A very bad deficiency can make your bones soft and can cause a condition called rickets.  Vitamin D is important to your body for different reasons, such as:   It helps your body absorb 2 minerals called calcium and phosphorus.  It helps make your bones healthy.  It may prevent some diseases, such as diabetes and multiple sclerosis.  It helps your muscles and heart. You can get vitamin D in several ways. It is a natural part of some foods. The  vitamin is also added to some dairy products and cereals. Some people take vitamin D supplements. Also, your body makes vitamin D when you are in the sun. It changes the sun's rays into a form of the vitamin that your body can use. CAUSES   Not eating enough foods that contain vitamin D.  Not getting enough sunlight.  Having certain digestive system diseases that make it hard to absorb vitamin D. These diseases include Crohn's disease, chronic pancreatitis, and cystic fibrosis.  Having a surgery in which part of the stomach or small intestine is removed.  Being obese. Fat cells pull vitamin D out of your blood. That means that obese people may not have enough vitamin D left in their blood and in other body tissues.  Having chronic kidney or liver disease. RISK FACTORS Risk factors are things that make you more likely to develop a vitamin D deficiency. They include:  Being older.  Not being able to get outside very much.  Living in a nursing home.  Having had broken bones.  Having weak or thin bones (osteoporosis).  Having a disease or condition that changes how your body absorbs vitamin D.  Having dark skin.  Some medicines such as seizure medicines or steroids.  Being overweight or obese. SYMPTOMS Mild cases of vitamin D deficiency may not have any symptoms. If you have a very bad case, symptoms may include:  Bone pain.  Muscle pain.  Falling often.  Broken bones caused by a minor injury, due to osteoporosis. DIAGNOSIS A blood test is the best way to tell if you have a vitamin D deficiency. TREATMENT Vitamin D deficiency can be treated in different ways. Treatment for vitamin D deficiency depends on what is causing it. Options include:  Taking vitamin D supplements.  Taking a calcium supplement. Your caregiver will suggest what dose is best for you. HOME CARE INSTRUCTIONS  Take any supplements that your caregiver prescribes. Follow the directions carefully. Take  only the suggested amount.  Have your blood tested 2 months after you start taking supplements.  Eat foods that contain vitamin D. Healthy choices include:  Fortified dairy products, cereals, or juices. Fortified means vitamin D has been added to the food. Check the label on the package to be sure.  Fatty fish like salmon or trout.  Eggs.  Oysters.  Do not use a tanning bed.  Keep your weight at a healthy level. Lose weight if you need to.  Keep all follow-up appointments. Your caregiver will need to perform blood tests to make sure your vitamin D deficiency is going away. SEEK MEDICAL CARE IF:  You have any questions about your treatment.  You continue to have symptoms of vitamin D deficiency.  You have nausea or vomiting.  You are constipated.  You feel confused.  You have severe abdominal or back pain. MAKE SURE YOU:  Understand these instructions.  Will watch your condition.  Will get help right away if you are not doing well or get worse.

## 2013-12-24 NOTE — Telephone Encounter (Signed)
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  I will start doxycycline 100 mg bid and I will follow up with her at her next appt to see if she is having some benefit.

## 2013-12-25 LAB — RPR

## 2013-12-25 LAB — HEPATITIS B SURFACE ANTIBODY,QUALITATIVE

## 2013-12-25 LAB — HEPATITIS C ANTIBODY: HCV Ab: NEGATIVE

## 2013-12-25 LAB — URINALYSIS, MICROSCOPIC ONLY
CASTS: NONE SEEN
SQUAMOUS EPITHELIAL / LPF: NONE SEEN

## 2013-12-25 LAB — MICROALBUMIN / CREATININE URINE RATIO
CREATININE, URINE: 110.1 mg/dL
Microalb Creat Ratio: 5.2 mg/g (ref 0.0–30.0)
Microalb, Ur: 0.57 mg/dL (ref 0.00–1.89)

## 2013-12-25 LAB — HIV ANTIBODY (ROUTINE TESTING W REFLEX): HIV: NONREACTIVE

## 2013-12-25 LAB — HEPATITIS B CORE ANTIBODY, TOTAL: Hep B Core Total Ab: REACTIVE — AB

## 2013-12-25 LAB — HEMOGLOBIN A1C
HEMOGLOBIN A1C: 7.6 % — AB (ref <5.7)
Mean Plasma Glucose: 171 mg/dL — ABNORMAL HIGH (ref <117)

## 2013-12-25 LAB — HEPATITIS A ANTIBODY, TOTAL: HEP A TOTAL AB: NONREACTIVE

## 2013-12-25 LAB — INSULIN, FASTING: INSULIN FASTING, SERUM: 6 u[IU]/mL (ref 3–28)

## 2013-12-27 LAB — HEPATITIS B E ANTIBODY: HEPATITIS BE ANTIBODY: POSITIVE — AB

## 2013-12-28 ENCOUNTER — Ambulatory Visit (INDEPENDENT_AMBULATORY_CARE_PROVIDER_SITE_OTHER): Payer: BC Managed Care – PPO | Admitting: Internal Medicine

## 2013-12-28 ENCOUNTER — Encounter: Payer: Self-pay | Admitting: Internal Medicine

## 2013-12-28 VITALS — BP 113/79 | HR 84 | Temp 98.3°F | Ht 67.0 in | Wt 156.0 lb

## 2013-12-28 DIAGNOSIS — K7110 Toxic liver disease with hepatic necrosis, without coma: Secondary | ICD-10-CM

## 2013-12-28 DIAGNOSIS — K769 Liver disease, unspecified: Secondary | ICD-10-CM

## 2013-12-28 DIAGNOSIS — K753 Granulomatous hepatitis, not elsewhere classified: Secondary | ICD-10-CM

## 2013-12-28 NOTE — Assessment & Plan Note (Addendum)
She is on doxycycline and hopefully will have some response to it.  I will send a sample to the [facility] for confirmation.  I discussed with the patient that the diagnosis is not definitive with a weakly positive titer but hopefully with sample from the [facility] or her clinical response to doxycycline will verify if true positive or not.  I will recheck her ultrasound of the liver to see if there is any improvement next week.  Other posibilities include Tb, autoimmune.  Her quantiferon Gold is negative.  Will consider steroids depending on response to doxy.  RTC 3-4 weeks.

## 2013-12-28 NOTE — Progress Notes (Signed)
   Subjective:    Patient ID: Courtney Stevens, female    DOB: 09-20-1956, 58 y.o.   MRN: [id number]  HPI She is here for hospital follow up.  She was seen for a liver lesion associated with fever and was hospitalized twice for it.  She initially had responded to IV antibiotics and sent out on levaquin but returned to the hospital with SIRS.  Work up was negative for cultures and only finding on her liver concerning for abscess vs cancer.  Biopsy pursued and negative for cancer but with granulomatous inflammation.  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  She has had persistent fever up to 102 almost every other day.  She initially had some weight loss but has gained some back.  AfterI received the lab results, I had the patient empirically start doxycycline.  She has not had a fever since Friday when she started the doxycycline.  She continues to have night sweats, malaise, fatigue.  No diarrhea.  In retrospect, she has been to the [facility] in [place] and to a farm in [place] and around farm animals including sheep.  No rashes.     Review of Systems  Constitutional: Positive for fever, chills, activity change, fatigue and unexpected weight change. Negative for appetite change.  Respiratory: Positive for shortness of breath.   Gastrointestinal: Positive for nausea and vomiting. Negative for abdominal pain and diarrhea.  Musculoskeletal: Negative for joint swelling.  Skin: Negative for rash.  Neurological: Negative for dizziness and light-headedness.  Hematological: Negative for adenopathy.  Psychiatric/Behavioral: Positive for sleep disturbance and dysphoric mood.       Objective:   Physical Exam  Constitutional: She appears well-developed and well-nourished.  She does not appear in any distress  HENT:  Mouth/Throat: No oropharyngeal exudate.  Eyes: No scleral icterus.  Cardiovascular: Normal rate, regular  rhythm and normal heart sounds.   No murmur heard. Pulmonary/Chest: Effort normal and breath sounds normal. No respiratory distress. She has no wheezes.  Lymphadenopathy:    She has no cervical adenopathy.  Skin: No rash noted.  Psychiatric: She has a normal mood and affect. Her behavior is normal.          Assessment & Plan:

## 2014-01-03 LAB — BRUCELLA IGG, IGM

## 2014-01-04 ENCOUNTER — Ambulatory Visit: Payer: BC Managed Care – PPO

## 2014-01-05 ENCOUNTER — Other Ambulatory Visit: Payer: Self-pay | Admitting: *Deleted

## 2014-01-05 ENCOUNTER — Other Ambulatory Visit: Payer: Self-pay | Admitting: Medical Oncology

## 2014-01-05 ENCOUNTER — Telehealth: Payer: Self-pay | Admitting: *Deleted

## 2014-01-05 MED ORDER — DOXYCYCLINE HYCLATE 100 MG PO TABS: 100.0000 mg | ORAL_TABLET | Freq: Two times a day (BID) | ORAL | Status: DC

## 2014-01-05 NOTE — Telephone Encounter (Signed)
Patient has one more day on her doxycycline and her follow up appt is not until 01/20/14. She does report one fever of 100.8 yesterday and she is still having night sweats, headache and body aches. Please advise Myrtis Hopping

## 2014-01-05 NOTE — Telephone Encounter (Signed)
Patient notified, Rx sent to pharmacy

## 2014-01-05 NOTE — Telephone Encounter (Signed)
Extend the doxycycline until the appt and will wait for the ultrasound.

## 2014-01-06 ENCOUNTER — Telehealth: Payer: Self-pay | Admitting: Internal Medicine

## 2014-01-06 ENCOUNTER — Ambulatory Visit
Admission: RE | Admit: 2014-01-06 | Discharge: 2014-01-06 | Disposition: A | Discharge status: Home / Self Care | Payer: BC Managed Care – PPO | Source: Ambulatory Visit | Attending: Internal Medicine | Admitting: Internal Medicine

## 2014-01-06 DIAGNOSIS — K7110 Toxic liver disease with hepatic necrosis, without coma: Secondary | ICD-10-CM

## 2014-01-06 DIAGNOSIS — K753 Granulomatous hepatitis, not elsewhere classified: Secondary | ICD-10-CM

## 2014-01-06 NOTE — Telephone Encounter (Signed)
, °

## 2014-01-06 NOTE — Telephone Encounter (Signed)
Spoke with patient regarding her ultrasound results, not any improvement.  She has felt a little better with slow improvement but unclear if this will persist.  She will continue with doxycycline until her appt with me and will reassess need for prednisone.

## 2014-01-07 LAB — FUNGUS CULTURE W SMEAR: Fungal Smear: NONE SEEN

## 2014-01-09 ENCOUNTER — Other Ambulatory Visit: Payer: Self-pay | Admitting: Internal Medicine

## 2014-01-18 ENCOUNTER — Ambulatory Visit (HOSPITAL_BASED_OUTPATIENT_CLINIC_OR_DEPARTMENT_OTHER): Payer: BC Managed Care – PPO | Admitting: Internal Medicine

## 2014-01-18 ENCOUNTER — Telehealth: Payer: Self-pay | Admitting: Internal Medicine

## 2014-01-18 VITALS — BP 139/75 | HR 74 | Temp 98.2°F | Resp 18 | Ht 67.0 in | Wt 160.5 lb

## 2014-01-18 DIAGNOSIS — R509 Fever, unspecified: Secondary | ICD-10-CM

## 2014-01-18 DIAGNOSIS — C240 Malignant neoplasm of extrahepatic bile duct: Secondary | ICD-10-CM

## 2014-01-18 DIAGNOSIS — R112 Nausea with vomiting, unspecified: Secondary | ICD-10-CM

## 2014-01-18 MED ORDER — PROCHLORPERAZINE MALEATE 10 MG PO TABS: 10.0000 mg | ORAL_TABLET | Freq: Three times a day (TID) | ORAL | Status: DC | PRN

## 2014-01-18 NOTE — Telephone Encounter (Signed)
gv and printed appt sched and avs for pt for June... °

## 2014-01-19 ENCOUNTER — Telehealth: Payer: Self-pay | Admitting: *Deleted

## 2014-01-19 NOTE — Telephone Encounter (Signed)
Paperwork completed and faxed to [facility] at [phone] per patient's request.  This paperwork is in reference to her hospitalization in January 2015. Received confirmation.  Originals placed for scanning, copies made for patient to pick up at appointment tomorrow 01/20/14. Landis Gandy, RN

## 2014-01-20 ENCOUNTER — Ambulatory Visit (INDEPENDENT_AMBULATORY_CARE_PROVIDER_SITE_OTHER): Payer: BC Managed Care – PPO | Admitting: Internal Medicine

## 2014-01-20 ENCOUNTER — Encounter: Payer: Self-pay | Admitting: Internal Medicine

## 2014-01-20 VITALS — BP 131/85 | HR 73 | Temp 97.9°F | Ht 67.0 in | Wt 157.0 lb

## 2014-01-20 DIAGNOSIS — K769 Liver disease, unspecified: Secondary | ICD-10-CM

## 2014-01-20 DIAGNOSIS — K7110 Toxic liver disease with hepatic necrosis, without coma: Secondary | ICD-10-CM

## 2014-01-20 DIAGNOSIS — K753 Granulomatous hepatitis, not elsewhere classified: Secondary | ICD-10-CM

## 2014-01-20 MED ORDER — PREDNISONE 20 MG PO TABS: 20.0000 mg | ORAL_TABLET | Freq: Every day | ORAL | Status: DC

## 2014-01-20 NOTE — Progress Notes (Signed)
[place], MD [address]  [place] [address]  DIAGNOSIS: Nausea with vomiting - Plan: prochlorperazine (COMPAZINE) 10 MG tablet  Malignant neoplasm of extrahepatic bile ducts - Plan: CBC with Differential, Comprehensive metabolic panel (Cmet) - [facility], Cancer antigen 19-9, CEA  Chief Complaint  Patient presents with  . Malignant neoplasm of extrahepatic bile ducts    CURRENT THERAPY:  Surveillance  INTERVAL HISTORY: Courtney Stevens 58 y.o. 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 She was last seen by me on 08/31/2013.  She was hospitalized from 12/03/2013 through 12/15/2013 due to fever, night sweats and abdominal symptoms. Her MRI of abdomen indicated a new peripherally enhancing mass posteromedially within the right hepatic lobe, highly worrisome for local recurrence or metastatic disease from the patient's bile duct adenocarcinoma. She had 2 biopsies which were negative for malignancy.  Her second liver biopsy (12/04/2013) demonstrated acute and chronic granulomatous inflammation with associated granulomata and  she was found to have antibody to Q fever.  She was discharged to complete a one month course of doxycycline per infectious disease (Dr. Linus Salmons).    She underwent a pancreaticoduodenectomy by Dr. Birdie Sons at [facility] center on 02/22/2008.  She denies any additional recent emergency room visits or hospitalizations.  She reports good energy and appetite.  She saw her PCP Dr. Unk Pinto this past May.  She continues creon 3 tablets with meals.  Her weight has been stable.  She reports her last colonoscopy was less than one year ago with one tubulus villous polyp removed and she has to follow up for repeat colonoscopy in 3 years. She reports some constitutional  symptoms such as night sweats.  She also has occasional nausea/vomiting.   MEDICAL HISTORY: Past Medical History  Diagnosis Date  . Cancer     bile duct ca  . GERD (gastroesophageal reflux disease)   . Diabetes mellitus   . Hypertension   . Hypothyroid   . Hyperlipidemia   . Asthma     INTERIM HISTORY: has Malignant neoplasm of extrahepatic bile ducts; T2 NIDDM; PANCREATIC INSUFFICIENCY; Personal history of colonic polyps; Family history of malignant neoplasm of gastrointestinal tract; Multiple pulmonary nodules; Unspecified essential hypertension; Mixed hyperlipidemia; Intrinsic asthma; History of Roux-en-Y gastric bypass; SIRS (systemic inflammatory response syndrome); Hypothyroidism; and Hepatic necrotizing granuloma on her problem list.    ALLERGIES:  is allergic to ace inhibitors; tilade; reglan; and betadine.  MEDICATIONS: has a current medication list which includes the following prescription(s): alprazolam, calcium citrate-vitamin d, cetirizine, diphenhydramine, doxycycline, furosemide, glyburide, hydrocodone-acetaminophen, iron combinations, levothyroxine, lipase/protease/amylase, magnesium, metformin, multivitamin with minerals, omeprazole, potassium chloride sa, vitamin c, vitamin d (ergocalciferol), and prochlorperazine.  SURGICAL HISTORY:  Past Surgical History  Procedure Laterality Date  . Gastric bypass  2004  . Cholecystectomy  1992  . Bile duct resection  2009  . Appendectomy    . Tonsillectomy    . Abdominal hysterectomy  1990    BIL OOPHORECTOMY  . Tubal ligation    . Breast surgery      BIOPSY NEG   PROBLEM LIST:  1. 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 The patient  underwent pancreaticoduodenectomy by Dr. Birdie Sons at [facility] on 02/22/2008. There was extensive involvement of peri-ductal soft tissue  and extension to the duodenal wall and  pancreas. Lymphovascular and perineural invasion was present. The posterior soft tissue margin was involved with tumor. Four out of 18 regional lymph nodes were involved by tumor. Tumor stage was pT4 pN1 pM0. Following surgery, the patient underwent radiation treatments in conjunction with continuous infusion 5 fluorouracil here in [place] from 04/01/2008 through 05/19/2008. She then received gemcitabine from 05/19/2008 through 12/02/2007. Because of a marked increase in CA19-9, the patient was felt to have a recurrence. This was not confirmed histologically and I believe imaging studies were also non-definitive. The patient received gemcitabine from 02/17/2009 through 05/25/2009. She then received Taxotere for 3 cycles from 06/15/2009 through 07/27/2009. The patient remains clinically disease-free on no treatment.  2. Elevated tumor markers. CEA and CA19-9 have been elevated with fluctuating levels.  3. Bilateral pulmonary nodules seen on CT scans from 02/19/2013.  4. History of malabsorption detected in early 2011, currently under  successful treatment.  5. Positive hepatitis B surface antibody and hepatitis B core  antibody.  6. Diabetes mellitus.  7. Hypertension.  8. Hypothyroidism diagnosed April 2011.  9. History of gastric bypass for obesity, Roux-en-Y, in 2004.  10.Diverticulosis.  11.Systolic ejection murmur.  REVIEW OF SYSTEMS:   Constitutional: Denies fevers, chills or abnormal weight loss Eyes: Denies blurriness of vision Ears, nose, mouth, throat, and face: Denies mucositis or sore throat Respiratory: Denies cough, dyspnea or wheezes Cardiovascular: Denies palpitation, chest discomfort or lower extremity swelling Gastrointestinal:  Denies nausea, heartburn or change in bowel habits Skin: Denies abnormal skin rashes Lymphatics: Denies new lymphadenopathy or easy bruising Neurological:Denies numbness, tingling or new weaknesses Behavioral/Psych: Mood is stable, no new changes   All other systems were reviewed with the patient and are negative.  PHYSICAL EXAMINATION: ECOG PERFORMANCE STATUS: 0 - Asymptomatic  Blood pressure 139/75, pulse 74, temperature 98.2 F (36.8 C), temperature source Oral, resp. rate 18, height 5\' 7"  (1.702 m), weight 160 lb 8 oz (72.802 kg), SpO2 100.00%.  GENERAL:alert, no distress and comfortable SKIN: skin color, texture, turgor are normal, no rashes or significant lesions EYES: normal, Conjunctiva are pink and non-injected, sclera clear OROPHARYNX:no exudate, no erythema and lips, buccal mucosa, and tongue normal  NECK: supple, thyroid normal size, non-tender, without nodularity LYMPH:  no palpable lymphadenopathy in the cervical, axillary or supraclavicular LUNGS: clear to auscultation and percussion with normal breathing effort HEART: regular rate & rhythm and SEM II/VI and no lower extremity  ABDOMEN:abdomen soft, non-tender and normal bowel sounds, post surgical scars well healed. No organomegaly appreciated.  Musculoskeletal:no cyanosis of digits and no clubbing  NEURO: alert & oriented x 3 with fluent speech, no focal motor/sensory deficits  Labs:  Lab Results  Component Value Date   WBC 17.7* 12/24/2013   HGB 11.7* 12/24/2013   HCT 35.0* 12/24/2013   MCV 84.1 12/24/2013   PLT 501* 12/24/2013   NEUTROABS 15.3* 12/24/2013      Chemistry      Component Value Date/Time   NA 136 12/24/2013 1112   NA 135* 08/31/2013 1032   K 4.3 12/24/2013 1112   K 3.7 08/31/2013 1032   CL 98 12/24/2013 1112   CL 101 02/25/2013 0938   CO2 29 12/24/2013 1112   CO2 25 08/31/2013 1032   BUN 13 12/24/2013 1112   BUN 9.4 08/31/2013 1032   CREATININE 1.00 12/24/2013 1112   CREATININE 0.87 12/14/2013 0500   CREATININE 1.0 08/31/2013 1032      Component Value Date/Time   CALCIUM  9.4 12/24/2013 1112   CALCIUM 9.2 08/31/2013 1032   ALKPHOS 124* 12/24/2013 1112   ALKPHOS 138 08/31/2013 1032   AST 30 12/24/2013 1112   AST 35* 08/31/2013 1032   ALT 20 12/24/2013 1112    ALT 26 08/31/2013 1032   BILITOT 0.3 12/24/2013 1112   BILITOT 0.37 08/31/2013 1032      RADIOGRAPHIC STUDIES:  Personally reviewed by me.   12/06/2013 MRI of brain No acute infarct. Minimal nonspecific white matter type changes without MR evidence of intracranial infection. If this is of high clinical concern, correlation with cerebral spinal fluid analysis may then be considered. Prominent peri vascular spaces versus remote basal ganglia lacune infarcts. No intracranial mass or abnormal enhancement.   MRI of Lumbar Spine IMPRESSION:  1. No lumbar impingement or findings suspicious for infection or  malignancy involving the lumbar spine.  2. Mild lumbar spondylosis and degenerative disc disease  11/25/2013 MRI of abdomen IMPRESSION:  1. As demonstrated on recent Chest CT, there is a new peripherally  enhancing mass posteromedially within the right hepatic lobe, highly  worrisome for local recurrence or metastatic disease from the  patient's bile duct adenocarcinoma. A liver abscess could have this  appearance, although that possibility is considered much less  likely.  2. Associated necrotic upper abdominal adenopathy, also worrisome  for metastatic disease. Possible small peritoneal implant adjacent  to the right hepatic lobe.  3. No significant biliary dilatation.  4. Extrinsic mass effect on the IVC without evidence of occlusion.   ASSESSMENT: Courtney Stevens 58 y.o. female with a history of Nausea with vomiting - Plan: prochlorperazine (COMPAZINE) 10 MG tablet  Malignant neoplasm of extrahepatic bile ducts - Plan: CBC with Differential, Comprehensive metabolic panel (Cmet) - [facility], Cancer antigen 19-9, CEA   PLAN:  1. Malignant neoplasm of extrahepatic bile ducts --Clinically, she continues to have some symptoms including fatigue, abdominal discomfort and stool changes secondary to #2.   --She is without evidence for recurrent disease for over 5 years since the time of her  diagnosis.  She had repeat imaging for the pulmonary nodules that showed resolution favoring a benign disease doing her last visit.    2. Q Fever. --Management per Dr. Linus Salmons.  She is on doxycycline with some improvement without complete resolution.  She will have a follow-up and consider additional treatment, i.e., if she continues to have fevers.  Her labs were collected and were sent to [facility] for verification of Q Fever.     3. Follow-up. --We will plan to see Courtney Stevens again in 3 months, at which time we will check CBC, chemistries, including CEA and CA 19-9.    All questions were answered. The patient knows to call the clinic with any problems, questions or concerns. We can certainly see the patient much sooner if necessary.  I spent 15 minutes counseling the patient face to face. The total time spent in the appointment was 25 minutes.    Veralyn Lopp, MD 01/20/2014 6:38 AM

## 2014-01-20 NOTE — Progress Notes (Signed)
   Subjective:    Patient ID: Courtney Stevens, female    DOB: [birth year], 58 y.o.   MRN: [id number]  HPI  She is here for follow up.  She was seen for a liver lesion associated with fever and was hospitalized twice for it.  She initially had responded to IV antibiotics and sent out on levaquin but returned to the hospital with SIRS.  Work up was negative for cultures and only finding on her liver concerning for abscess vs cancer.  Biopsy pursued and negative for cancer but with granulomatous inflammation.  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  She has had persistent fever up to 102 almost every other day.  She initially had some weight loss but has gained some back.  AfterI received the lab results, I had the patient empirically start doxycycline.   In retrospect, she has been to the [facility] in [place] and to a farm in [place] and around farm animals including sheep.  No rashes.     Review of Systems  Constitutional: Positive for fever, chills, activity change, fatigue and unexpected weight change. Negative for appetite change.  Respiratory: Positive for shortness of breath.   Gastrointestinal: Positive for nausea and vomiting. Negative for abdominal pain and diarrhea.  Musculoskeletal: Negative for joint swelling.  Skin: Negative for rash.  Neurological: Negative for dizziness and light-headedness.  Hematological: Negative for adenopathy.  Psychiatric/Behavioral: Positive for sleep disturbance and dysphoric mood.       Objective:   Physical Exam  Constitutional: She appears well-developed and well-nourished.  She does not appear in any distress  HENT:  Mouth/Throat: No oropharyngeal exudate.  Eyes: No scleral icterus.  Cardiovascular: Normal rate, regular rhythm and normal heart sounds.   No murmur heard. Pulmonary/Chest: Effort normal and breath sounds normal. No respiratory distress. She has no  wheezes.  Lymphadenopathy:    She has no cervical adenopathy.  Skin: No rash noted.  Psychiatric: She has a normal mood and affect. Her behavior is normal.          Assessment & Plan:

## 2014-01-20 NOTE — Assessment & Plan Note (Signed)
No improvement on doxycycline.  I will try steroids at 20 mg daily and see if she has some improvement.  RTC 2 weeks.

## 2014-01-22 LAB — AFB CULTURE WITH SMEAR (NOT AT ARMC)
Acid Fast Smear: NONE SEEN
Special Requests: NORMAL

## 2014-01-23 LAB — AFB CULTURE, BLOOD

## 2014-01-25 ENCOUNTER — Telehealth: Payer: Self-pay | Admitting: *Deleted

## 2014-01-25 NOTE — Telephone Encounter (Signed)
Message copied by Landis Gandy on Tue Jan 25, 2014  1:42 PM ------      Message from: Thayer Headings      Created: Tue Jan 25, 2014  9:04 AM       Can you see if she noted any improvement on steroids - decrease in night sweats, no fever, less malaise?  thanks ------

## 2014-01-25 NOTE — Telephone Encounter (Signed)
Last night sweats occurred Saturday night, last fever Friday 101 - took tylenol. Doesn't feel like the steroids have helped much.  Felt like her "lungs were full of cotton" yesterday.   Head and chest congestion, neck/side pain, using vicks vapor rub with benadryl.  Cough unchanged.  Wants to know if lab results from [facility] are back yet.

## 2014-01-26 ENCOUNTER — Ambulatory Visit (INDEPENDENT_AMBULATORY_CARE_PROVIDER_SITE_OTHER): Payer: BC Managed Care – PPO | Admitting: Internal Medicine

## 2014-01-26 ENCOUNTER — Encounter: Payer: Self-pay | Admitting: Internal Medicine

## 2014-01-26 VITALS — BP 120/74 | HR 64 | Temp 97.9°F | Resp 16 | Ht 67.0 in | Wt 158.2 lb

## 2014-01-26 DIAGNOSIS — J0410 Acute tracheitis without obstruction: Secondary | ICD-10-CM

## 2014-01-26 DIAGNOSIS — Z79899 Other long term (current) drug therapy: Secondary | ICD-10-CM

## 2014-01-26 DIAGNOSIS — K8689 Other specified diseases of pancreas: Secondary | ICD-10-CM

## 2014-01-26 LAB — CBC WITH DIFFERENTIAL/PLATELET
Basophils Absolute: 0 10*3/uL (ref 0.0–0.1)
Basophils Relative: 0 % (ref 0–1)
EOS PCT: 0 % (ref 0–5)
Eosinophils Absolute: 0 10*3/uL (ref 0.0–0.7)
HEMATOCRIT: 35.4 % — AB (ref 36.0–46.0)
HEMOGLOBIN: 11.4 g/dL — AB (ref 12.0–15.0)
LYMPHS PCT: 12 % (ref 12–46)
Lymphs Abs: 1.2 10*3/uL (ref 0.7–4.0)
MCH: 27.7 pg (ref 26.0–34.0)
MCHC: 32.2 g/dL (ref 30.0–36.0)
MCV: 85.9 fL (ref 78.0–100.0)
MONOS PCT: 4 % (ref 3–12)
Monocytes Absolute: 0.4 10*3/uL (ref 0.1–1.0)
NEUTROS ABS: 8.7 10*3/uL — AB (ref 1.7–7.7)
Neutrophils Relative %: 84 % — ABNORMAL HIGH (ref 43–77)
Platelets: 372 10*3/uL (ref 150–400)
RBC: 4.12 MIL/uL (ref 3.87–5.11)
RDW: 15.5 % (ref 11.5–15.5)
WBC: 10.4 10*3/uL (ref 4.0–10.5)

## 2014-01-26 NOTE — Telephone Encounter (Signed)
Nothing in your box - will check with Maudie Mercury to see if it is across the street.

## 2014-01-26 NOTE — Telephone Encounter (Signed)
Left your instructions on her voicemail.

## 2014-01-26 NOTE — Telephone Encounter (Signed)
Have her just stop the steroids.  She can try 400 mg of ibuprofen at night or 1 Aleve.  Can you check my box for something from the CDC in her name?  i will not be available the rest of the day today.  thanks

## 2014-01-26 NOTE — Patient Instructions (Signed)

## 2014-01-26 NOTE — Progress Notes (Signed)
Subjective:    Patient ID: Courtney Stevens, female    DOB: December 29, 1955, 58 y.o.   MRN: [id number]  Cough This is a new problem. The current episode started in the past 7 days. The problem has been waxing and waning. The problem occurs every few minutes. The cough is productive of purulent sputum. Associated symptoms include rhinorrhea. Pertinent negatives include no chest pain, chills, ear pain, fever, headaches, heartburn, hemoptysis, myalgias, nasal congestion, postnasal drip, rash, sore throat, shortness of breath, sweats or wheezing.     Medication List       This list is accurate as of: 01/26/14  9:16 PM.  Always use your most recent med list.               ALPRAZolam 1 MG tablet  Commonly known as:  XANAX  Take 1 tablet (1 mg total) by mouth at bedtime as needed. anxiety     calcium citrate-vitamin D 315-200 MG-UNIT per tablet  Commonly known as:  CITRACAL+D  Take 1 tablet by mouth 3 (three) times daily.     cetirizine 10 MG tablet  Commonly known as:  ZYRTEC  Take 10 mg by mouth every morning.     furosemide 80 MG tablet  Commonly known as:  LASIX  TAKE 1/2 BID for fluid     glyBURIDE 5 MG tablet  Commonly known as:  DIABETA  Take 2 tablets (10 mg total) by mouth daily with breakfast.     IRON COMPLEX PO  Take 1 tablet by mouth daily. Pt takes 65mg  iron     levothyroxine 200 MCG tablet  Commonly known as:  SYNTHROID, LEVOTHROID  TAKE 1 TABLET BY MOUTH EVERY DAY     lipase/protease/amylase 12000 UNITS Cpep capsule  Commonly known as:  CREON-12/PANCREASE  Take 1 capsule by mouth 3 (three) times daily before meals.     Magnesium 250 MG Tabs  Take 1 tablet by mouth 4 (four) times daily.     metFORMIN 500 MG tablet  Commonly known as:  GLUCOPHAGE  Take 500 mg by mouth 3 (three) times daily with meals. One tab at breakfast, one tab at lunch and two tabs for dinner     multivitamin with minerals tablet  Take 1 tablet by mouth as needed.     omeprazole 40 MG  capsule  Commonly known as:  PRILOSEC  Take 40 mg by mouth 2 (two) times daily.     potassium chloride SA 20 MEQ tablet  Commonly known as:  K-DUR,KLOR-CON  Take 20 mEq by mouth 2 (two) times daily.     predniSONE 20 MG tablet  Commonly known as:  DELTASONE  Take 1 tablet (20 mg total) by mouth daily.     prochlorperazine 10 MG tablet  Commonly known as:  COMPAZINE  Take 1 tablet (10 mg total) by mouth every 8 (eight) hours as needed for nausea or vomiting.     vitamin C 500 MG tablet  Commonly known as:  ASCORBIC ACID  Take 500 mg by mouth daily.     Vitamin D (Ergocalciferol) 50000 UNITS Caps capsule  Commonly known as:  DRISDOL  Take 50,000 Units by mouth every evening.       Allergies  Allergen Reactions  . Ace Inhibitors Anaphylaxis  . Tilade [Nedocromil] Anaphylaxis  . Reglan [Metoclopramide] Nausea And Vomiting  . Betadine [Povidone Iodine] Rash    iching    Past Medical History  Diagnosis Date  . Cancer  bile duct ca  . GERD (gastroesophageal reflux disease)   . Diabetes mellitus   . Hypertension   . Hypothyroid   . Hyperlipidemia   . Asthma     Review of Systems  Constitutional: Negative for fever and chills.  HENT: Positive for rhinorrhea and sinus pressure. Negative for ear pain, postnasal drip, sore throat, trouble swallowing and voice change.   Eyes: Negative.   Respiratory: Positive for cough. Negative for hemoptysis, shortness of breath and wheezing.   Cardiovascular: Negative.  Negative for chest pain.  Gastrointestinal: Negative.  Negative for heartburn.  Musculoskeletal: Negative.  Negative for myalgias.  Skin: Negative.  Negative for color change, pallor and rash.  Neurological: Negative for headaches.   BP 120/74  Pulse 64  Temp(Src) 97.9 F (36.6 C) (Temporal)  Resp 16  Ht 5\' 7"  (1.702 m)  Wt 158 lb 3.2 oz (71.759 kg)  BMI 24.77 kg/m2   Objective:   Physical Exam  Constitutional: She is oriented to person, place, and time.  She appears well-developed and well-nourished. No distress.  HENT:  Nose: Nose normal.  Mouth/Throat: Oropharynx is clear and moist.  Slight frontal and maxillary discomfort. Tm's nl color, but sl retracted Bilat  Eyes: EOM are normal. Pupils are equal, round, and reactive to light. Right eye exhibits no discharge. Left eye exhibits no discharge.  Neck: Normal range of motion. Neck supple. No JVD present. No thyromegaly present.  Cardiovascular: Normal rate, regular rhythm and normal heart sounds.   No murmur heard. Pulmonary/Chest: Effort normal. No respiratory distress. She has no wheezes. She has rales.  Abdominal: Soft.  Lymphadenopathy:    She has no cervical adenopathy.  Neurological: She is alert and oriented to person, place, and time. No cranial nerve deficit.  Skin: Skin is dry. No rash noted. She is not diaphoretic. There is erythema. No pallor.   Assessment & Plan:   1. Acute tracheitis without mention of obstruction  Recc Sputum C&S given recent Hx/o ? Of possible Q-Fever and Tx with 1st Doxycycline and currently Steroids.  2. Encounter for long-term (current) use of other medications  - CBC with Differential - Hepatic function panel

## 2014-01-27 ENCOUNTER — Other Ambulatory Visit: Payer: Self-pay | Admitting: Internal Medicine

## 2014-01-27 ENCOUNTER — Other Ambulatory Visit: Payer: BC Managed Care – PPO

## 2014-01-27 ENCOUNTER — Encounter: Payer: Self-pay | Admitting: *Deleted

## 2014-01-27 DIAGNOSIS — J209 Acute bronchitis, unspecified: Secondary | ICD-10-CM

## 2014-01-27 LAB — HEPATIC FUNCTION PANEL
ALT: 29 U/L (ref 0–35)
AST: 27 U/L (ref 0–37)
Albumin: 3.6 g/dL (ref 3.5–5.2)
Alkaline Phosphatase: 109 U/L (ref 39–117)
TOTAL PROTEIN: 6.1 g/dL (ref 6.0–8.3)
Total Bilirubin: 0.2 mg/dL (ref 0.2–1.2)

## 2014-01-28 ENCOUNTER — Other Ambulatory Visit: Payer: Self-pay | Admitting: Internal Medicine

## 2014-01-28 ENCOUNTER — Other Ambulatory Visit: Payer: BC Managed Care – PPO

## 2014-01-28 DIAGNOSIS — J209 Acute bronchitis, unspecified: Secondary | ICD-10-CM

## 2014-01-28 NOTE — Telephone Encounter (Signed)
Error

## 2014-01-30 LAB — RESPIRATORY CULTURE OR RESPIRATORY AND SPUTUM CULTURE
Gram Stain: NONE SEEN
ORGANISM ID, BACTERIA: NORMAL
Organism ID, Bacteria: NORMAL

## 2014-02-01 ENCOUNTER — Ambulatory Visit (INDEPENDENT_AMBULATORY_CARE_PROVIDER_SITE_OTHER): Payer: BC Managed Care – PPO | Admitting: Internal Medicine

## 2014-02-01 ENCOUNTER — Ambulatory Visit
Admission: RE | Admit: 2014-02-01 | Discharge: 2014-02-01 | Disposition: A | Discharge status: Home / Self Care | Payer: BC Managed Care – PPO | Source: Ambulatory Visit | Attending: Internal Medicine | Admitting: Internal Medicine

## 2014-02-01 ENCOUNTER — Telehealth: Payer: Self-pay | Admitting: *Deleted

## 2014-02-01 VITALS — BP 139/82 | HR 88 | Temp 98.9°F | Wt 161.0 lb

## 2014-02-01 DIAGNOSIS — R509 Fever, unspecified: Secondary | ICD-10-CM

## 2014-02-01 DIAGNOSIS — R05 Cough: Secondary | ICD-10-CM

## 2014-02-01 DIAGNOSIS — R059 Cough, unspecified: Secondary | ICD-10-CM

## 2014-02-01 DIAGNOSIS — R0989 Other specified symptoms and signs involving the circulatory and respiratory systems: Secondary | ICD-10-CM

## 2014-02-01 DIAGNOSIS — J019 Acute sinusitis, unspecified: Secondary | ICD-10-CM

## 2014-02-01 DIAGNOSIS — R058 Other specified cough: Secondary | ICD-10-CM

## 2014-02-01 MED ORDER — AMOXICILLIN-POT CLAVULANATE 875-125 MG PO TABS: 1.0000 | ORAL_TABLET | Freq: Two times a day (BID) | ORAL | Status: DC

## 2014-02-01 MED ORDER — IOHEXOL 300 MG/ML  SOLN
75.0000 mL | Freq: Once | INTRAMUSCULAR | Status: AC | PRN
  Administered 2014-02-01: 75 mL via INTRAVENOUS

## 2014-02-01 NOTE — Telephone Encounter (Signed)
PA for both procedures, good for 30 days from today.  TL#[id number].  Pt will be worked in at [facility], is in lobby. Landis Gandy, RN

## 2014-02-02 ENCOUNTER — Telehealth: Payer: Self-pay | Admitting: *Deleted

## 2014-02-02 ENCOUNTER — Telehealth: Payer: Self-pay | Admitting: Internal Medicine

## 2014-02-02 MED ORDER — HYDROXYCHLOROQUINE SULFATE 200 MG PO TABS: 200.0000 mg | ORAL_TABLET | Freq: Three times a day (TID) | ORAL | Status: DC

## 2014-02-02 MED ORDER — DOXYCYCLINE HYCLATE 100 MG PO TABS: 100.0000 mg | ORAL_TABLET | Freq: Two times a day (BID) | ORAL | Status: DC

## 2014-02-02 NOTE — Telephone Encounter (Signed)
Pt already called by Dr Linus Salmons this AM re: results

## 2014-02-02 NOTE — Progress Notes (Signed)
   Subjective:    Patient ID: Courtney Stevens, female    DOB: 09/22/1956, 58 y.o.   MRN: [id number]  HPI  She is here for follow up.  She has a history of Roux-en-Y gastric bypass in 2004 and bile duct adenocarcinom with resection, chemo and radiation in 2009 and returns with FUO.  She has a history of fever dating back to October 2014 with associated chills, night sweats and malaise.   Initially, with symptoms of URI vs bronchitis or sinusitis, she was treated with levaquin and steroids by her PCP.  She was noted to have a significantly elevated WBC (on steroids) of 26,000 and sent to the ED and with tachycardia, hypotension, treated for SIRS-like illness the first of January 2015.  Cultures all negative, though CT scan of chest did note a liver lesion.  After discharge, she continued steroids and levaquin for a few days and seemed to improve but returned to the ED about 1 week later with [facility] presentation including confusion, tachycardia, fever to 101, WBC of 16,500 (off steroids several days), elevated lactate. She was placed on broad spectrum antibiotics but continued to spike fevers, though otherwise clinically improved, inclduing WBC (which rose to 23,000, then normalized). With no source and concern for metastatic disease, she underwent biopsy of liver lesion.  Path was significant for granulomatous inflammation. A repeat biopsy was pursued for cultures which all remained negative (bacterial, fungal, AFB) and work up with labs revealed a weakly positive IgM for phase 2 of Q fever at 1:16.  Quantiferon gold negative.  I then started her on therapy with doxycycline for one month but did not have any improvement.  Serology from the CDC also was sent and positive for phase II IgG (convalescent sera) and was positive at 256.    In retrospect, she has been to the [facility] in [place] and to a farm in [place] and around farm animals including sheep in July 2014.  She though did not have any response to  treatment for Q fever.  She is currently having cough, congestion and sinus pain and repeat CT with pneumonia but also notes that right hepatic lobe is unchanged with mass effect on hemidiaphragm.  Also persistent subcarinal lymph nodes.     She is requesting second opinion at an academic center.       Review of Systems  Constitutional: Positive for fever, chills, activity change, fatigue and unexpected weight change. Negative for appetite change.  Respiratory: Positive for shortness of breath.   Gastrointestinal: Positive for nausea and vomiting. Negative for abdominal pain and diarrhea.  Musculoskeletal: Negative for joint swelling.  Skin: Negative for rash.  Neurological: Negative for dizziness and light-headedness.  Hematological: Negative for adenopathy.  Psychiatric/Behavioral: Positive for sleep disturbance and dysphoric mood.       Objective:   Physical Exam  Constitutional: She appears well-developed and well-nourished.  She does not appear in any distress  HENT:  Mouth/Throat: No oropharyngeal exudate.  Eyes: No scleral icterus.  Cardiovascular: Normal rate, regular rhythm and normal heart sounds.   No murmur heard. Pulmonary/Chest: Effort normal. No respiratory distress. She has no wheezes. She has rales.  Musculoskeletal: She exhibits no edema.  Lymphadenopathy:    She has no cervical adenopathy.  Skin: No rash noted.  Psychiatric:  tearful          Assessment & Plan:

## 2014-02-02 NOTE — Assessment & Plan Note (Signed)
Still no definitive diagnosis.  I did get CT sinuses and chest and reviewed personally.  She continues to have a liver lesion of right hepatic lobe and now CDC serology has returned with phase II IgG of 256.  Unclear significance but will try to treat with doxy + hydroxychloroquine.  Her previous echo in January did not suggest IE but I will repeat to see if any valve dysfunction, endocarditis.  ?TEE.   ? laproscopic liver biopsy.   I am also going to refer her to an academic center for any input.

## 2014-02-02 NOTE — Telephone Encounter (Signed)
Message copied by Landis Gandy on Wed Feb 02, 2014 11:57 AM ------      Message from: Thayer Headings      Created: Wed Feb 02, 2014  9:14 AM       I have now received the serology report from the [facility] and does show persistent serology for Q fever, unclear significance.  Previously acute now chronic.  While we wait to get her into an academic center I would like to get a repeat echo (ordered) and start her on doxy again with hydroxychloroquine until she is seen at [facility] or wherever.  Echo ordered and prescriptions sent.  She can start concurrently with current antibiotic I sent her yesterday or wait until she completes it.              Thanks ------

## 2014-02-02 NOTE — Telephone Encounter (Signed)
I called patient with results of CT scan.  Noted opacity of lll and will treat with Augmentin for 7 days.  Sinus disease not significant.

## 2014-02-03 ENCOUNTER — Telehealth: Payer: Self-pay | Admitting: *Deleted

## 2014-02-03 NOTE — Telephone Encounter (Signed)
Patient given results.  Will pick up her new medications and start them.  Patient has pursued follow up at [facility] on her own, given appointment for 4/2.  Oncology will follow her there concurrently.  Will fax her office notes to the numbers provided.  Per Kennyth Lose, echo scheduled for 3/30.   [facility] phone: [phone], fax: [phone]. Will fax once I have a provider's name for the receipt.

## 2014-02-03 NOTE — Telephone Encounter (Signed)
Called patient and notified her of appt for 2D echo at [facility] for 02/14/14 at 2:45 pm. She will register at Admitting at the [facility], 1rst floor. Courtney Stevens

## 2014-02-04 ENCOUNTER — Telehealth: Payer: Self-pay | Admitting: *Deleted

## 2014-02-04 ENCOUNTER — Other Ambulatory Visit: Payer: Self-pay | Admitting: Emergency Medicine

## 2014-02-04 DIAGNOSIS — J189 Pneumonia, unspecified organism: Secondary | ICD-10-CM

## 2014-02-04 NOTE — Telephone Encounter (Signed)
Records faxed to [facility].  Pt has appointment with Dr. Sela Hua 4/16 at 9:00.  Pt aware. Landis Gandy, RN

## 2014-02-04 NOTE — Telephone Encounter (Signed)
Patient called requesting a copy of the CDC results. She wants this faxed to [facility] as well and she will come to the office to pick up. Courtney Stevens

## 2014-02-07 ENCOUNTER — Ambulatory Visit (INDEPENDENT_AMBULATORY_CARE_PROVIDER_SITE_OTHER): Payer: BC Managed Care – PPO | Admitting: Internal Medicine

## 2014-02-07 ENCOUNTER — Encounter: Payer: Self-pay | Admitting: Internal Medicine

## 2014-02-07 ENCOUNTER — Ambulatory Visit (INDEPENDENT_AMBULATORY_CARE_PROVIDER_SITE_OTHER)
Admission: RE | Admit: 2014-02-07 | Discharge: 2014-02-07 | Disposition: A | Discharge status: Home / Self Care | Payer: BC Managed Care – PPO | Source: Ambulatory Visit | Attending: Internal Medicine | Admitting: Internal Medicine

## 2014-02-07 VITALS — BP 118/74 | HR 72 | Temp 98.0°F | Ht 67.0 in | Wt 157.6 lb

## 2014-02-07 DIAGNOSIS — R918 Other nonspecific abnormal finding of lung field: Secondary | ICD-10-CM

## 2014-02-07 DIAGNOSIS — J189 Pneumonia, unspecified organism: Secondary | ICD-10-CM

## 2014-02-07 NOTE — Patient Instructions (Addendum)
Mucinex dm 1200 mg every 12 hours for cough   Prilosec  Take 30- 60 min before your first and last meals of the day   Please remember to go to the  x-ray department downstairs for your tests - we will call you with the results when they are available to decide whether to continue the augmentin or not.  Late add:  Augmentin 875 mg take one pill twice daily  X 10 more  Days

## 2014-02-07 NOTE — Progress Notes (Signed)
Subjective:    Patient ID: Courtney Stevens, female    DOB: 1956/04/27   MRN: [id number]  HPI  21 yowf quit smoking 1979 with am cough that completely resolved then dx'ed with bile duct sugery at [facility] and treatement with chemo and RT completed Oct 2010 with CT scans q 6 month referred by [place] with pulmonary nodules 1-3 mm seen first in pulmonary clinic 02/2013 for SPN/ cough (the cough resolved) then referred 02/07/2014 back by Dr Lawanna Kobus for cough/ ? Pna.   03/03/2013 1st pulmonary cc new onset moderate severity persistent daily cough 3 months indolent onset worse in ams prod of yellow mucus but no assoc sinus complaints, did have "ear infection" rx with doxy, erythromycina dn zpak.   rec Augmentin 875 mg twice daily x 10 days Prednisone 10 mg take  4 each am x 2 days,   2 each am x 2 days,  1 each am x2days and stop  Omeprazole Take 30- 60 min before your first and last meals of the day  For cough mucinex dm max dose is 1200 mg every 12 hours as needed GERD diet >>> complete resolution of all symptoms  Admit date: 12/03/2013  Discharge date: 12/14/2013  Recommendations for Outpatient Follow-up:  1. Pt will need to follow up with PCP in 2 weeks post discharge 2. Please obtain BMP to evaluate electrolytes and kidney function 3. Please also check CBC to evaluate Hg and Hct levels Discharge Diagnoses:  Principal Problem:  SIRS (systemic inflammatory response syndrome)  Active Problems:  Malignant neoplasm of extrahepatic bile ducts  DM  PANCREATIC INSUFFICIENCY  Unspecified essential hypertension  Intrinsic asthma  History of Roux-en-Y gastric bypass  Fever  Hypothyroidism  Lactic acidosis  Sepsis  FUO with SIRS  -although not documented--had fever on 102.58F on 12/12/2013  -check bartonella serology--pt raised chickens  -although variable sensitivity and specificity--check toxo serology--neg  -appreciate ID recommendations  -venous duplex legs--neg  -remain off of  antimicrobials  -Imaging of the brain, chest, abdomen negative for infectious etiology  -PICC line 12/08/13  -All cultures negative to date  -Patient continued to have intermittent fevers during the hospitalization, but remained hemodynamically stable.  -Patient will need to followup with infectious disease an outpatient setting for further recommendations  -Remain off of antimicrobials at this time  Hepatic Granuloma  -pathology from liver biopsy= granuloma, stains negative  -discussed with Dr. Willette Brace liver biopsy (12/10/13) with fungal and AFB stains and culture  -fungal, brucella, coxiella serologies sent;  -QuantiFERON-TB--negative  -agree with d/c antibacterials  -case discussed with IR, Dr. 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 Birdie Sons at [facility] center on 02/22/2008.  -radical resection for node-positive adenocarcinoma of the bile duct with positive lymph node involvement (2 out of 17 lymph nodes) with positive surgical margins.  -appreciated Dr. Juliann Mule followup  DM2  -12/07/2013 hemoglobin A1c 7.9  -Continue Humalog sliding scale  -increase NovoLog 5 units before each meal  -increase to Lantus 10 units  -The patient was hesitant to go home on insulin  -The patient was instructed to restart her metformin at her previous home dose  -Glyburide was increased to 10 mg daily  -Patient was instructed to keep her glycemic log and check  her sugars at least once daily and to take her glycemic log to her physician for adjustment of her diabetes regimen.  Pancreatic insufficiency  -Continue Creon  -Patient is eating well  Hypothyroidism  -Continue Synthroid  Anemia of  chronic disease  -Hemoglobin is stable    02/07/2014 Pulmonary consultation/Courtney Stevens re: ? pna Chief Complaint  Patient presents with  . Pulmonary Consult    Referred by Dr. Melford Aase for PNA and lung infection-Chronic Q-fever.  Ct done 02/01/14     Onset of cough was 3 d p admit for UTI initially  dry dx  rx as  uti and return with fever and TME and dx as possible Q fever  By Dr Linus Salmons  and while inpt  the cough became productive yellow mucus then green rx augmentin started x one week prior to OV  With no improvement in mucus but fever is gone with last day of augmentin scheduled for  02/08/14 ( also now on both doxy, which prev failed as monotherapy, plus plaquenil) with neg sinus ct but chest ct suggesting patchy LLL as dz 02/01/14   No obvious other patterns in day to day or daytime variabilty or assoc sob (unless coughing) or cp or chest tightness, subjective wheeze overt   hb symptoms(on ppi with meals). No unusual exp hx or h/o childhood pna/ asthma or knowledge of premature birth.  Sleeping ok without nocturnal  or early am exacerbation  of respiratory  c/o's or need for noct saba. Also denies any obvious fluctuation of symptoms with weather or environmental changes or other aggravating or alleviating factors except as outlined above   Current Medications, Allergies, Complete Past Medical History, Past Surgical History, Family History, and Social History were reviewed in [employer] record.               Review of Systems  Constitutional: Negative for fever and unexpected weight change.  HENT: Positive for congestion, postnasal drip, sinus pressure and sore throat. Negative for dental problem, ear pain, nosebleeds, rhinorrhea, sneezing and trouble swallowing.   Eyes: Negative for redness and itching.  Respiratory: Positive for cough, chest tightness, shortness of breath and wheezing.   Cardiovascular: Negative for palpitations and leg swelling.  Gastrointestinal:  Negative for nausea and vomiting.  Genitourinary: Negative for dysuria.  Musculoskeletal: Positive for joint swelling.       Pain in joints  Skin: Negative for rash.  Neurological: Positive for headaches.  Hematological: Bruises/bleeds easily.  Psychiatric/Behavioral: Negative for dysphoric mood. The patient is nervous/anxious.        Objective:   Physical Exam  amb thin wf nad  Wt Readings from Last 3 Encounters:  02/07/14 157 lb 9.6 oz (71.487 kg)  02/01/14 161 lb (73.029 kg)  01/26/14 158 lb 3.2 oz (71.759 kg)      HEENT: nl dentition, turbinates, and orophanx. Nl external ear canals without cough reflex   NECK :  without JVD/Nodes/TM/ nl carotid upstrokes bilaterally   LUNGS: no acc muscle use, a few insp and exp rhonchi L > R    CV:  RRR  no s3 or murmur or increase in P2, no edema   ABD:  soft and nontender with nl excursion in the supine position. No bruits or organomegaly, bowel sounds nl  MS:  warm without deformities, calf tenderness, cyanosis or clubbing  SKIN: warm and dry without lesions    NEURO:  alert, approp, no deficits     CXR  02/07/2014 :  There  is no evidence of pneumonia nor other acute cardiopulmonary  abnormality.       Assessment & Plan:

## 2014-02-08 ENCOUNTER — Other Ambulatory Visit: Payer: Self-pay | Admitting: Internal Medicine

## 2014-02-08 MED ORDER — AMOXICILLIN-POT CLAVULANATE 875-125 MG PO TABS: 1.0000 | ORAL_TABLET | Freq: Two times a day (BID) | ORAL | Status: DC

## 2014-02-08 NOTE — Progress Notes (Signed)
Quick Note:  Spoke with pt and notified of results per Dr. Melvyn Novas. Pt verbalized understanding and denied any questions. Rx sent to pharm and pt to call with update ______

## 2014-02-08 NOTE — Assessment & Plan Note (Addendum)
She has defervesced ( for now) with a pattern of recurrent fever x months and a liver mass with bx c/w non-specific granulomatous changes only but worrisome for recurrent choliangiocarcinoma by hx.  This is hard to put together with new hx of "purulent sputum" and airspace dz on CT chest but most likely this is the residual of HCAP and not a new unrelated infectious process.  The Q fever is an interesting finding but doubt it has any relation to the lung problem  For now rec complete another 10 d of augmentin and and keep appt to see the ID dept at [facility] for a second opinion,  Return her sooner if resp problems become more problematic in meantime.  See instructions for specific recommendations which were reviewed directly with the patient who was given a copy with highlighter outlining the key components.

## 2014-02-08 NOTE — Assessment & Plan Note (Signed)
See CT 02/19/13 1-3 mm tntc  No evidence these lesions have evolved over almost a year and no need to contemplate any kind of lung bx at this pont

## 2014-02-08 NOTE — Telephone Encounter (Signed)
Patient given copy of CDC results and copy faxed to [facility] ID [place] Myrtis Hopping

## 2014-02-14 ENCOUNTER — Ambulatory Visit (HOSPITAL_COMMUNITY)
Admission: RE | Admit: 2014-02-14 | Discharge: 2014-02-14 | Disposition: A | Discharge status: Home / Self Care | Payer: BC Managed Care – PPO | Source: Ambulatory Visit | Attending: Internal Medicine | Admitting: Internal Medicine

## 2014-02-14 DIAGNOSIS — E119 Type 2 diabetes mellitus without complications: Secondary | ICD-10-CM | POA: Insufficient documentation

## 2014-02-14 DIAGNOSIS — I519 Heart disease, unspecified: Secondary | ICD-10-CM

## 2014-02-14 DIAGNOSIS — R509 Fever, unspecified: Secondary | ICD-10-CM | POA: Insufficient documentation

## 2014-02-14 DIAGNOSIS — I10 Essential (primary) hypertension: Secondary | ICD-10-CM | POA: Insufficient documentation

## 2014-02-14 NOTE — Progress Notes (Signed)
Echocardiogram 2D Echocardiogram has been performed.  Courtney Stevens 02/14/2014, 3:37 PM

## 2014-02-15 ENCOUNTER — Telehealth: Payer: Self-pay | Admitting: *Deleted

## 2014-02-15 NOTE — Telephone Encounter (Signed)
Message copied by Landis Gandy on Tue Feb 15, 2014  4:04 PM ------      Message from: Thayer Headings      Created: Tue Feb 15, 2014  2:56 PM       Could you let her know that her echocardiogram looks just fine, no changes from when it was done before or new concerns.  Thanks ------

## 2014-02-15 NOTE — Telephone Encounter (Signed)
Notified patient. Will fax it to [facility] at her request. Landis Gandy, RN

## 2014-02-25 ENCOUNTER — Other Ambulatory Visit: Payer: Self-pay

## 2014-02-25 DIAGNOSIS — Z1231 Encounter for screening mammogram for malignant neoplasm of breast: Secondary | ICD-10-CM

## 2014-02-28 ENCOUNTER — Other Ambulatory Visit: Payer: BC Managed Care – PPO

## 2014-02-28 ENCOUNTER — Ambulatory Visit: Payer: BC Managed Care – PPO

## 2014-03-01 ENCOUNTER — Ambulatory Visit (INDEPENDENT_AMBULATORY_CARE_PROVIDER_SITE_OTHER): Payer: BC Managed Care – PPO | Admitting: Pulmonary Disease

## 2014-03-01 ENCOUNTER — Encounter: Payer: Self-pay | Admitting: Pulmonary Disease

## 2014-03-01 ENCOUNTER — Ambulatory Visit (INDEPENDENT_AMBULATORY_CARE_PROVIDER_SITE_OTHER)
Admission: RE | Admit: 2014-03-01 | Discharge: 2014-03-01 | Disposition: A | Discharge status: Home / Self Care | Payer: BC Managed Care – PPO | Source: Ambulatory Visit | Attending: Pulmonary Disease | Admitting: Pulmonary Disease

## 2014-03-01 ENCOUNTER — Other Ambulatory Visit: Payer: Self-pay | Admitting: Pulmonary Disease

## 2014-03-01 VITALS — BP 132/82 | HR 91 | Temp 97.1°F | Ht 67.0 in | Wt 158.0 lb

## 2014-03-01 DIAGNOSIS — R0781 Pleurodynia: Secondary | ICD-10-CM

## 2014-03-01 DIAGNOSIS — R071 Chest pain on breathing: Secondary | ICD-10-CM

## 2014-03-01 MED ORDER — LEVOFLOXACIN 750 MG PO TABS: 750.0000 mg | ORAL_TABLET | Freq: Every day | ORAL | Status: DC

## 2014-03-01 MED ORDER — IBUPROFEN 600 MG PO TABS: 600.0000 mg | ORAL_TABLET | Freq: Four times a day (QID) | ORAL | Status: DC

## 2014-03-01 NOTE — Assessment & Plan Note (Signed)
The patient has a four-day history of sharp chest discomfort on her right side anteriorly, which is clearly pleuritic in nature. She does not have worsening shortness of breath, and her oxygen saturations are 100% today on room air. Her history is really not consistent with thromboembolic disease, but we will need to keep this in mind. I will get a chest x-ray to make sure that she does not have a recurrence of her pneumonia, but I suspect this is unlikely. I wonder if this is simply musculoskeletal pain associated with her significant coughing when she had her recent pneumonia. Will treat with ibuprofen if her chest x-ray is unremarkable.

## 2014-03-01 NOTE — Patient Instructions (Signed)
Will check chest xray today to make sure no infection, and will call you with the results. Try taking ibuprofen 600mg  one 3-4 times a day on a regular basis for the next 5 days to see if chest discomfort resolves.  Can then take as needed if helps. Please let us know if your symptoms are not improving

## 2014-03-01 NOTE — Addendum Note (Signed)
Addended by: Virl Cagey on: 03/01/2014 05:05 PM   Modules accepted: Orders

## 2014-03-01 NOTE — Progress Notes (Signed)
   Subjective:    Patient ID: Courtney Stevens, female    DOB: 10/19/1956, 58 y.o.   MRN: [id number]  HPI The patient comes in today for an acute sick visit. She is followed by Dr. Melvyn Novas,  and had a recent hospital-acquired pneumonia which was treated appropriately with antibiotics. She had clearing of her chest x-ray at the last visit here, but now gives a four-day history of cough with scant thumbnail sized purulence as well as right-sided pleuritic chest pain.  She has not had any significant fever, but did have a low-grade temperature 3 days ago. She does not feel overly congested in her chest, nor does she have increased shortness of breath above her baseline. She has not had any calf pain or worsening lower extremity edema. She has not had any hemoptysis. She has had some sinus pressure, but denies postnasal drip. It should be noted the patient had severe coughing episodes during her recent history of pneumonia.   Review of Systems  Constitutional: Negative for fever and unexpected weight change.  HENT: Negative for congestion, dental problem, ear pain, nosebleeds, postnasal drip, rhinorrhea, sinus pressure, sneezing, sore throat and trouble swallowing.   Eyes: Negative for redness and itching.  Respiratory: Positive for cough, chest tightness and shortness of breath. Negative for wheezing.        Change in color mucus-green  Cardiovascular: Positive for chest pain. Negative for palpitations and leg swelling.  Gastrointestinal: Negative for nausea and vomiting.  Genitourinary: Negative for dysuria.  Musculoskeletal: Negative for joint swelling.  Skin: Negative for rash.  Neurological: Negative for headaches.  Hematological: Does not bruise/bleed easily.  Psychiatric/Behavioral: Negative for dysphoric mood. The patient is not nervous/anxious.        Objective:   Physical Exam Well-developed female in no acute distress Nose without purulence or discharge noted Neck without lymphadenopathy  or thyromegaly Chest totally clear to auscultation, no wheezes or crackles Cardiac exam with regular rate and rhythm Lower extremities with mild ankle edema, no cyanosis Alert and oriented, moves all 4 extremities.       Assessment & Plan:

## 2014-03-06 ENCOUNTER — Other Ambulatory Visit: Payer: Self-pay | Admitting: Internal Medicine

## 2014-03-15 ENCOUNTER — Encounter: Payer: Self-pay | Admitting: Internal Medicine

## 2014-03-23 ENCOUNTER — Telehealth: Payer: Self-pay | Admitting: *Deleted

## 2014-03-23 NOTE — Telephone Encounter (Signed)
LMTCB

## 2014-03-23 NOTE — Telephone Encounter (Signed)
Message copied by Rosana Berger on Wed Mar 23, 2014  8:57 AM ------      Message from: Tanda Rockers      Created: Tue Mar 22, 2014  4:59 PM       If feeling all better from her ov then no cxr and no need for f/u ov for 6 months then ov with cxr            If not feeling all better would go ahead with CTa chest      ----- Message -----         From: Rosana Berger, CMA         Sent: 03/22/2014  10:18 AM           To: Tanda Rockers, MD            Pt had cxr done since her last visit with you      Does she still need to ct chest now?      Please advise thanks      ----- Message -----         From: Tanda Rockers, MD         Sent: 02/07/2014   1:28 PM           To: Rosana Berger, CMA            CT chest no contrast due              ------

## 2014-03-24 ENCOUNTER — Encounter: Payer: Self-pay | Admitting: Internal Medicine

## 2014-03-24 ENCOUNTER — Ambulatory Visit: Payer: Self-pay | Admitting: Physician Assistant

## 2014-03-24 ENCOUNTER — Ambulatory Visit (INDEPENDENT_AMBULATORY_CARE_PROVIDER_SITE_OTHER): Payer: BC Managed Care – PPO | Admitting: Internal Medicine

## 2014-03-24 VITALS — BP 114/78 | HR 76 | Temp 97.4°F | Resp 16 | Ht 67.0 in | Wt 160.4 lb

## 2014-03-24 DIAGNOSIS — E119 Type 2 diabetes mellitus without complications: Secondary | ICD-10-CM

## 2014-03-24 DIAGNOSIS — Z86010 Personal history of colonic polyps: Secondary | ICD-10-CM

## 2014-03-24 DIAGNOSIS — Z23 Encounter for immunization: Secondary | ICD-10-CM

## 2014-03-24 DIAGNOSIS — E782 Mixed hyperlipidemia: Secondary | ICD-10-CM

## 2014-03-24 DIAGNOSIS — I10 Essential (primary) hypertension: Secondary | ICD-10-CM

## 2014-03-24 DIAGNOSIS — E559 Vitamin D deficiency, unspecified: Secondary | ICD-10-CM

## 2014-03-24 DIAGNOSIS — Z79899 Other long term (current) drug therapy: Secondary | ICD-10-CM | POA: Insufficient documentation

## 2014-03-24 MED ORDER — FLUCONAZOLE 150 MG PO TABS: ORAL_TABLET | ORAL | Status: DC

## 2014-03-24 NOTE — Patient Instructions (Signed)

## 2014-03-24 NOTE — Progress Notes (Signed)
Patient ID: Courtney Stevens, female   DOB: Jun 08, 1956, 58 y.o.   MRN: [id number]    This very nice 58 y.o. MWF presents for 3 month follow up with Hypertension, Hyperlipidemia, Hypothyroidism, T2 NIDDM and Vitamin D Deficiency. Patient has been recently evaluated for FUO and was suspected to possibly have had an acute episode of Q-Fever and was referred to [facility] for a 2sd opinion with a now tentative Dx of Granulomatous Hepatitis. Her spiking fevers and chills do now seem to have resolved for the last 6 weeks or so.   HTN predates since 1989. BP has been controlled at home. Today's BP: 114/78 mmHg. Patient denies any cardiac type chest pain, palpitations, dyspnea/orthopnea/PND, dizziness, claudication, or dependent edema.   Hyperlipidemia is controlled with diet & meds. Last Lipoids were at goal as below. Patient denies myalgias or other med SE's.  Lab Results  Component Value Date   CHOL 110 12/24/2013   HDL 34* 12/24/2013   LDLCALC 45 12/24/2013   TRIG 154* 12/24/2013   CHOLHDL 3.2 12/24/2013    Also, the patient has history of T2 NIDDM since 2000 and last A1c was elevated at 7.6% in Feb 2015. Patient denies any symptoms of reactive hypoglycemia, diabetic polys, paresthesias or visual blurring.   Further, Patient has history of Vitamin D Deficiency of 28 in 2008 and last vitamin D of 61 in Aug 2014. Patient supplements vitamin D without any suspected side-effects.    Medication List     ALPRAZolam 1 MG tablet  Commonly known as:  XANAX  TAKE 1/2 TO 1 TABLET BY MOUTH UP TO 3 TIMES A DAY AS NEEDED FOR ANXIETY & SLEEP     calcium citrate-vitamin D 315-200 MG-UNIT per tablet  Commonly known as:  CITRACAL+D  Take 1 tablet by mouth 3 (three) times daily.     cetirizine 10 MG tablet  Commonly known as:  ZYRTEC  Take 10 mg by mouth every morning.     fluconazole 150 MG tablet  Commonly known as:  DIFLUCAN  Take 1 tablet weekly as needed for yeast infection     furosemide 80 MG tablet   Commonly known as:  LASIX  TAKE 1/2 BID for fluid     glyBURIDE 5 MG tablet  Commonly known as:  DIABETA  Take 2 tablets (10 mg total) by mouth daily with breakfast.     HYDROcodone-acetaminophen 5-325 MG per tablet  Commonly known as:  NORCO/VICODIN     ibuprofen 600 MG tablet  Commonly known as:  ADVIL,MOTRIN  Take 1 tablet (600 mg total) by mouth 4 (four) times daily.     IRON COMPLEX PO  Take 1 tablet by mouth daily. Pt takes 65mg  iron     levothyroxine 200 MCG tablet  Commonly known as:  SYNTHROID, LEVOTHROID  TAKE 1 TABLET BY MOUTH EVERY DAY     lipase/protease/amylase 12000 UNITS Cpep capsule  Commonly known as:  CREON-12/PANCREASE  Take 1 capsule by mouth 3 (three) times daily before meals.     Magnesium 250 MG Tabs  Take 1 tablet by mouth 4 (four) times daily.     metFORMIN 500 MG tablet  Commonly known as:  GLUCOPHAGE  Take 500 mg by mouth 3 (three) times daily with meals. One tab at breakfast, one tab at lunch and two tabs for dinner     multivitamin with minerals tablet  Take 1 tablet by mouth as needed.     omeprazole 40 MG capsule  Commonly  known as:  PRILOSEC  Take 40 mg by mouth 2 (two) times daily.     potassium chloride SA 20 MEQ tablet  Commonly known as:  K-DUR,KLOR-CON  Take 20 mEq by mouth 2 (two) times daily.     prochlorperazine 10 MG tablet  Commonly known as:  COMPAZINE     vitamin C 500 MG tablet  Commonly known as:  ASCORBIC ACID  Take 500 mg by mouth daily.     Vitamin D (Ergocalciferol) 50000 UNITS Caps capsule  Commonly known as:  DRISDOL  Take 50,000 Units by mouth every evening.         Allergies  Allergen Reactions  . Ace Inhibitors Anaphylaxis  . Tilade [Nedocromil] Anaphylaxis  . Reglan [Metoclopramide] Nausea And Vomiting  . Betadine [Povidone Iodine] Rash    iching     PMHx:   Past Medical History  Diagnosis Date  . Cancer     bile duct ca  . GERD (gastroesophageal reflux disease)   . Diabetes mellitus    . Hypertension   . Hypothyroid   . Hyperlipidemia   . Asthma     FHx:    Reviewed / unchanged  SHx:    Reviewed / unchanged   Systems Review: Constitutional: Denies fever, chills, wt changes, headaches, insomnia, fatigue, night sweats, change in appetite. Eyes: Denies redness, blurred vision, diplopia, discharge, itchy, watery eyes.  ENT: Denies discharge, congestion, post nasal drip, epistaxis, sore throat, earache, hearing loss, dental pain, tinnitus, vertigo, sinus pain, snoring.  CV: Denies chest pain, palpitations, irregular heartbeat, syncope, dyspnea, diaphoresis, orthopnea, PND, claudication or edema. Respiratory: denies cough, dyspnea, DOE, pleurisy, hoarseness, laryngitis, wheezing.  Gastrointestinal: Denies dysphagia, odynophagia, heartburn, reflux, water brash, abdominal pain or cramps, nausea, vomiting, bloating, diarrhea, constipation, hematemesis, melena, hematochezia  or hemorrhoids. Genitourinary: Denies dysuria, frequency, urgency, nocturia, hesitancy,  hematuria or flank pain. She does report Vaginal D/C and itching after recent Abx treatment.discharge. Musculoskeletal: Denies arthralgias, myalgias, stiffness, jt. swelling, pain, limping or strain/sprain.  Skin: Denies pruritus, rash, hives, warts, acne, eczema or change in skin lesion(s). Neuro: No weakness, tremor, incoordination, spasms, paresthesia or pain. Psychiatric: Denies confusion, memory loss or sensory loss. Endo: Denies change in weight, skin or hair change.  Heme/Lymph: No excessive bleeding, bruising or enlarged lymph nodes.   Exam:  BP 114/78  Pulse 76  Temp(Src) 97.4 F (36.3 C) (Temporal)  Resp 16  Ht 5\' 7"  (1.702 m)  Wt 160 lb 6.4 oz (72.757 kg)  BMI 25.12 kg/m2  Appears well nourished - in no distress. Eyes: PERRLA, EOMs, conjunctiva no swelling or erythema. Sinuses: No frontal/maxillary tenderness ENT/Mouth: EAC's clear, TM's nl w/o erythema, bulging. Nares clear w/o erythema,  swelling, exudates. Oropharynx clear without erythema or exudates. Oral hygiene is good. Tongue normal, non obstructing. Hearing intact.  Neck: Supple. Thyroid nl. Car 2+/2+ without bruits, nodes or JVD. Chest: Respirations nl with BS clear & equal w/o rales, rhonchi, wheezing or stridor.  Cor: Heart sounds normal w/ regular rate and rhythm without sig. murmurs, gallops, clicks, or rubs. Peripheral pulses normal and equal  without edema.  Abdomen: Soft & bowel sounds normal. Non-tender w/o guarding, rebound, hernias, masses, or organomegaly.  Lymphatics: Unremarkable.  Musculoskeletal: Full ROM all peripheral extremities, joint stability, 5/5 strength, and normal gait.  Skin: Warm, dry without exposed rashes, lesions or ecchymosis apparent.  Neuro: Cranial nerves intact, reflexes equal bilaterally. Sensory-motor testing grossly intact. Tendon reflexes grossly intact.  Pysch: Alert & oriented x 3. Insight and  judgement nl & appropriate. No ideations.  Assessment and Plan:  1. Hypertension - Continue monitor blood pressure at home. Continue diet/meds same.  2. Hyperlipidemia - Continue diet/meds, exercise,& lifestyle modifications. Continue monitor periodic cholesterol/liver & renal functions   3. T2 NIDDM - continue recommend prudent low glycemic diet, weight control, regular exercise, diabetic monitoring and periodic eye exams.  4. Vitamin D Deficiency - Continue supplementation.  5. S/P Bile Duct /Klatkin Carcinoma  6. FUO   Recommended regular exercise, BP monitoring, weight control, and discussed med and SE's. Recommended labs to assess and monitor clinical status. Further disposition pending results of labs. Rx Diflucan emperic Tx presumptive of candida vaginitis.

## 2014-03-25 ENCOUNTER — Other Ambulatory Visit: Payer: Self-pay

## 2014-03-25 DIAGNOSIS — D71 Functional disorders of polymorphonuclear neutrophils: Secondary | ICD-10-CM

## 2014-03-25 LAB — BASIC METABOLIC PANEL WITH GFR
BUN: 12 mg/dL (ref 6–23)
CHLORIDE: 101 meq/L (ref 96–112)
CO2: 27 mEq/L (ref 19–32)
Calcium: 9.3 mg/dL (ref 8.4–10.5)
Creat: 0.94 mg/dL (ref 0.50–1.10)
GFR, EST AFRICAN AMERICAN: 77 mL/min
GFR, EST NON AFRICAN AMERICAN: 67 mL/min
Glucose, Bld: 98 mg/dL (ref 70–99)
Potassium: 4 mEq/L (ref 3.5–5.3)
SODIUM: 139 meq/L (ref 135–145)

## 2014-03-25 LAB — CBC WITH DIFFERENTIAL/PLATELET
BASOS ABS: 0 10*3/uL (ref 0.0–0.1)
BASOS PCT: 0 % (ref 0–1)
Eosinophils Absolute: 0.1 10*3/uL (ref 0.0–0.7)
Eosinophils Relative: 2 % (ref 0–5)
HCT: 33.7 % — ABNORMAL LOW (ref 36.0–46.0)
Hemoglobin: 11 g/dL — ABNORMAL LOW (ref 12.0–15.0)
Lymphocytes Relative: 20 % (ref 12–46)
Lymphs Abs: 1.3 10*3/uL (ref 0.7–4.0)
MCH: 27.2 pg (ref 26.0–34.0)
MCHC: 32.6 g/dL (ref 30.0–36.0)
MCV: 83.4 fL (ref 78.0–100.0)
Monocytes Absolute: 0.5 10*3/uL (ref 0.1–1.0)
Monocytes Relative: 8 % (ref 3–12)
NEUTROS ABS: 4.6 10*3/uL (ref 1.7–7.7)
NEUTROS PCT: 70 % (ref 43–77)
PLATELETS: 315 10*3/uL (ref 150–400)
RBC: 4.04 MIL/uL (ref 3.87–5.11)
RDW: 15.2 % (ref 11.5–15.5)
WBC: 6.6 10*3/uL (ref 4.0–10.5)

## 2014-03-25 LAB — LIPID PANEL
CHOLESTEROL: 133 mg/dL (ref 0–200)
HDL: 63 mg/dL (ref >39)
LDL Cholesterol: 50 mg/dL (ref 0–99)
TRIGLYCERIDES: 100 mg/dL (ref <150)
Total CHOL/HDL Ratio: 2.1 Ratio
VLDL: 20 mg/dL (ref 0–40)

## 2014-03-25 LAB — VITAMIN D 25 HYDROXY (VIT D DEFICIENCY, FRACTURES): Vit D, 25-Hydroxy: 82 ng/mL (ref 30–89)

## 2014-03-25 LAB — HEPATIC FUNCTION PANEL
ALT: 18 U/L (ref 0–35)
AST: 27 U/L (ref 0–37)
Albumin: 3.6 g/dL (ref 3.5–5.2)
Alkaline Phosphatase: 113 U/L (ref 39–117)
Bilirubin, Direct: 0.1 mg/dL (ref 0.0–0.3)
Indirect Bilirubin: 0.1 mg/dL — ABNORMAL LOW (ref 0.2–1.2)
TOTAL PROTEIN: 6.7 g/dL (ref 6.0–8.3)
Total Bilirubin: 0.2 mg/dL (ref 0.2–1.2)

## 2014-03-25 LAB — HEMOGLOBIN A1C
Hgb A1c MFr Bld: 6.8 % — ABNORMAL HIGH (ref <5.7)
MEAN PLASMA GLUCOSE: 148 mg/dL — AB (ref <117)

## 2014-03-25 LAB — TSH: TSH: 0.94 u[IU]/mL (ref 0.350–4.500)

## 2014-03-25 LAB — MAGNESIUM: Magnesium: 2 mg/dL (ref 1.5–2.5)

## 2014-03-25 LAB — INSULIN, FASTING: INSULIN FASTING, SERUM: 10 u[IU]/mL (ref 3–28)

## 2014-03-28 ENCOUNTER — Encounter: Payer: Self-pay | Admitting: Physician Assistant

## 2014-03-28 ENCOUNTER — Ambulatory Visit (INDEPENDENT_AMBULATORY_CARE_PROVIDER_SITE_OTHER): Payer: BC Managed Care – PPO | Admitting: Physician Assistant

## 2014-03-28 VITALS — BP 138/82 | HR 72 | Temp 97.7°F | Resp 16 | Wt 156.0 lb

## 2014-03-28 DIAGNOSIS — T148 Other injury of unspecified body region: Secondary | ICD-10-CM

## 2014-03-28 DIAGNOSIS — S40862A Insect bite (nonvenomous) of left upper arm, initial encounter: Principal | ICD-10-CM

## 2014-03-28 DIAGNOSIS — W57XXXA Bitten or stung by nonvenomous insect and other nonvenomous arthropods, initial encounter: Principal | ICD-10-CM

## 2014-03-28 DIAGNOSIS — L089 Local infection of the skin and subcutaneous tissue, unspecified: Secondary | ICD-10-CM

## 2014-03-28 MED ORDER — PREDNISONE 20 MG PO TABS: ORAL_TABLET | ORAL | Status: DC

## 2014-03-28 MED ORDER — DOXYCYCLINE HYCLATE 100 MG PO TABS: 100.0000 mg | ORAL_TABLET | Freq: Two times a day (BID) | ORAL | Status: DC

## 2014-03-28 MED ORDER — DEXAMETHASONE SODIUM PHOSPHATE 10 MG/ML IJ SOLN
10.0000 mg | Freq: Once | INTRAMUSCULAR | Status: AC
  Administered 2014-03-28: 10 mg via INTRAMUSCULAR

## 2014-03-28 NOTE — Telephone Encounter (Signed)
LMTCB

## 2014-03-28 NOTE — Progress Notes (Signed)
   Subjective:    Patient ID: Courtney Stevens, female    DOB: 1956/09/27, 58 y.o.   MRN: [id number]  HPI 58 y.o. DMII female present with insect bite. She was out in her garden Sat and she felt a pinch on her left inner arm. She states she put ice and bite cream on it but it has continued to hurt and get bigger. The area is now red, warm and very tender. She has had some nausea with some myalgias.but denies fever, chills, diarrhea, SOB, CP, trouble swallowing.   She recently had Q fever and has been on doxy, augmentin for pneumonia, and levaquin for UTI.   Review of Systems     Objective:   Physical Exam  Constitutional: She appears well-developed and well-nourished.  Neck: Normal range of motion. Neck supple.  Cardiovascular: Normal rate and regular rhythm.   Pulmonary/Chest: Effort normal and breath sounds normal.  Lymphadenopathy:    She has no cervical adenopathy.    She has axillary adenopathy.       Left axillary: Lateral adenopathy present.       Left: Epitrochlear adenopathy present.  Skin:  Left inner arm with area of erythema, warmth and tenderness of 4 by 4 inches with several raised nodules in the middle, well circumscribed.        Assessment & Plan:  Insect bite with infection- Doxy, prednisone injection, prednisone 10, RICE

## 2014-03-28 NOTE — Patient Instructions (Signed)
Insect Bite  Mosquitoes, flies, fleas, bedbugs, and many other insects can bite. Insect bites are different from insect stings. A sting is when venom is injected into the skin. Some insect bites can transmit infectious diseases.  SYMPTOMS   Insect bites usually turn red, swell, and itch for 2 to 4 days. They often go away on their own.  TREATMENT   Your caregiver may prescribe antibiotic medicines if a bacterial infection develops in the bite.  HOME CARE INSTRUCTIONS   Do not scratch the bite area.   Keep the bite area clean and dry. Wash the bite area thoroughly with soap and water.   Put ice or cool compresses on the bite area.   Put ice in a plastic bag.   Place a towel between your skin and the bag.   Leave the ice on for 20 minutes, 4 times a day for the first 2 to 3 days, or as directed.   You may apply a baking soda paste, cortisone cream, or calamine lotion to the bite area as directed by your caregiver. This can help reduce itching and swelling.   Only take over-the-counter or prescription medicines as directed by your caregiver.   If you are given antibiotics, take them as directed. Finish them even if you start to feel better.  You may need a tetanus shot if:   You cannot remember when you had your last tetanus shot.   You have never had a tetanus shot.   The injury broke your skin.  If you get a tetanus shot, your arm may swell, get red, and feel warm to the touch. This is common and not a problem. If you need a tetanus shot and you choose not to have one, there is a rare chance of getting tetanus. Sickness from tetanus can be serious.  SEEK IMMEDIATE MEDICAL CARE IF:    You have increased pain, redness, or swelling in the bite area.   You see a red line on the skin coming from the bite.   You have a fever.   You have joint pain.   You have a headache or neck pain.   You have unusual weakness.   You have a rash.   You have chest pain or shortness of breath.    You have abdominal pain, nausea, or vomiting.   You feel unusually tired or sleepy.  MAKE SURE YOU:    Understand these instructions.   Will watch your condition.   Will get help right away if you are not doing well or get worse.  Document Released: 12/12/2004 Document Revised: 01/27/2012 Document Reviewed: 06/05/2011  [employer] Patient Information 2014 [employer].

## 2014-03-29 NOTE — Telephone Encounter (Signed)
[email or web address]. Jennifer Castillo, CMA   

## 2014-03-29 NOTE — Telephone Encounter (Signed)
Pt returned call. Please call back at [phone]

## 2014-03-29 NOTE — Telephone Encounter (Signed)
Pt returned call

## 2014-03-29 NOTE — Telephone Encounter (Signed)
I spoke with the pt and she states she is feeling much better and does not feel she needs to the ct. Advised follow-up in 6 months. Isabella Bing, CMA

## 2014-04-01 ENCOUNTER — Encounter: Payer: Self-pay | Admitting: Physician Assistant

## 2014-04-01 ENCOUNTER — Other Ambulatory Visit: Payer: BC Managed Care – PPO

## 2014-04-01 ENCOUNTER — Ambulatory Visit (INDEPENDENT_AMBULATORY_CARE_PROVIDER_SITE_OTHER): Payer: BC Managed Care – PPO | Admitting: Gastroenterology

## 2014-04-01 VITALS — BP 118/76 | HR 66 | Ht 67.0 in | Wt 150.0 lb

## 2014-04-01 DIAGNOSIS — K759 Inflammatory liver disease, unspecified: Secondary | ICD-10-CM

## 2014-04-01 DIAGNOSIS — R160 Hepatomegaly, not elsewhere classified: Secondary | ICD-10-CM

## 2014-04-01 DIAGNOSIS — K753 Granulomatous hepatitis, not elsewhere classified: Secondary | ICD-10-CM

## 2014-04-01 DIAGNOSIS — K769 Liver disease, unspecified: Secondary | ICD-10-CM

## 2014-04-01 DIAGNOSIS — Z86010 Personal history of colonic polyps: Secondary | ICD-10-CM

## 2014-04-01 DIAGNOSIS — K7110 Toxic liver disease with hepatic necrosis, without coma: Secondary | ICD-10-CM

## 2014-04-01 DIAGNOSIS — Z800 Family history of malignant neoplasm of digestive organs: Secondary | ICD-10-CM

## 2014-04-01 NOTE — Assessment & Plan Note (Signed)
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  Etiology for this is unclear.  Of obvious concern is a recurrent tumor but I think this is less likely in the face of normal liver tests and apparent good health of the patient.  I would expect the patient to be symptomatic if she had a recurrent neoplasm.  Recommendations #1 check CEA, CA 19-9 and alpha-fetoprotein #2 repeat CT of the liver

## 2014-04-01 NOTE — Assessment & Plan Note (Signed)
Plan follow-up colonoscopy 2017 

## 2014-04-01 NOTE — Assessment & Plan Note (Signed)
Colonoscopy at five-year intervals 

## 2014-04-01 NOTE — Patient Instructions (Signed)
You have been scheduled for a CT scan of the abdomen and pelvis at [facility] ([address]---this is in the same building as Press [facility]).   You are scheduled on 04/06/2014 at 2:30pm. You should arrive 15 minutes prior to your appointment time for registration. Please follow the written instructions below on the day of your exam:  WARNING: IF YOU ARE ALLERGIC TO IODINE/X-RAY DYE, PLEASE NOTIFY RADIOLOGY IMMEDIATELY AT [phone]! YOU WILL BE GIVEN A 13 HOUR PREMEDICATION PREP.  1) Do not eat or drink anything after 10:30am (4 hours prior to your test) 2) You have been given 2 bottles of oral contrast to drink. The solution may taste               better if refrigerated, but do NOT add ice or any other liquid to this solution. Shake             well before drinking.    Drink 1 bottle of contrast @ 12:30pm (2 hours prior to your exam)  Drink 1 bottle of contrast @ 1:30pm (1 hour prior to your exam)  You may take any medications as prescribed with a small amount of water except for the following: Metformin, Glucophage, Glucovance, Avandamet, Riomet, Fortamet, Actoplus Met, Janumet, Glumetza or Metaglip. The above medications must be held the day of the exam AND 48 hours after the exam.  The purpose of you drinking the oral contrast is to aid in the visualization of your intestinal tract. The contrast solution may cause some diarrhea. Before your exam is started, you will be given a small amount of fluid to drink. Depending on your individual set of symptoms, you may also receive an intravenous injection of x-ray contrast/dye. Plan on being at [facility] for 30 minutes or long, depending on the type of exam you are having performed.  This test typically takes 30-45 minutes to complete.  If you have any questions regarding your exam or if you need to reschedule, you may call the CT department at [phone] between the hours of 8:00 am and 5:00 pm,  Monday-Friday.  __________________________________Go to the basement for labs today______________________________________

## 2014-04-01 NOTE — Progress Notes (Signed)
_                                                                                                                History of Present Illness: Patient has returned for evaluation of liver mass.  In January, 2015 she developed high spiking fevers and a new liver mass was noted by CT and MRI.  Biopsies demonstrated granulomatous hepatitis.  Stains for AFB were negative.  She was subsequently treated for Q fever which was established by serologic testing.  She initially had high spiking fevers and purulent cough.  The symptoms have entirely subsided.  Her only complaint at this point is occasional sharp right upper quadrant pain.  Workup for other infectious etiologies was negative.  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    Past Medical History  Diagnosis Date  . Cancer     bile duct ca  . GERD (gastroesophageal reflux disease)   . Diabetes mellitus   . Hypertension   . Hypothyroid   . Hyperlipidemia   . Asthma   . Hepatitis    Past Surgical History  Procedure Laterality Date  . Gastric bypass  2004  . Cholecystectomy  1992  . Bile duct resection  2009  . Appendectomy    . Tonsillectomy    . Abdominal hysterectomy  1990    BIL OOPHORECTOMY  . Tubal ligation    . Breast surgery      BIOPSY NEG   family history includes Alzheimer's disease in her father; Colon cancer in her mother; Diabetes in her father; Emphysema in her father; Glaucoma in her mother; Heart attack in her brother and father; Heart disease in her father; Hypertension in her mother; Nephrolithiasis in her brother, father, and sister. Current Outpatient Prescriptions  Medication Sig Dispense Refill  . ALPRAZolam (XANAX) 1 MG tablet TAKE 1/2 TO 1 TABLET BY MOUTH UP TO 3 TIMES A DAY AS NEEDED FOR ANXIETY & SLEEP  90 tablet  0  . calcium citrate-vitamin D (CITRACAL+D) 315-200 MG-UNIT per tablet Take 1 tablet  by mouth 3 (three) times daily.      . cetirizine (ZYRTEC) 10 MG tablet Take 10 mg by mouth every morning.        Marland Kitchen doxycycline (VIBRA-TABS) 100 MG tablet Take 1 tablet (100 mg total) by mouth 2 (two) times daily.  20 tablet  0  . fluconazole (DIFLUCAN) 150 MG tablet Take 1 tablet weekly as needed for yeast infection  4 tablet  3  . furosemide (LASIX) 80 MG tablet TAKE 1/2 BID for fluid      . glyBURIDE (DIABETA) 5 MG tablet Take 2 tablets (10 mg total) by mouth daily with breakfast.  60 tablet  0  . ibuprofen (ADVIL,MOTRIN) 600 MG tablet Take 1 tablet (600 mg total) by mouth 4 (four) times daily.  30 tablet  0  . Iron Combinations (IRON COMPLEX PO) Take 1 tablet by mouth daily. Pt takes  65mg  iron       . levothyroxine (SYNTHROID, LEVOTHROID) 200 MCG tablet TAKE 1 TABLET BY MOUTH EVERY DAY  90 tablet  4  . lipase/protease/amylase (CREON-10/PANCREASE) 12000 UNITS CPEP Take 1 capsule by mouth 3 (three) times daily before meals.        . Magnesium 250 MG TABS Take 1 tablet by mouth 4 (four) times daily.       . metFORMIN (GLUCOPHAGE) 500 MG tablet Take 500 mg by mouth 3 (three) times daily with meals. One tab at breakfast, one tab at lunch and two tabs for dinner      . Multiple Vitamins-Minerals (MULTIVITAMIN WITH MINERALS) tablet Take 1 tablet by mouth as needed.       Marland Kitchen omeprazole (PRILOSEC) 40 MG capsule Take 40 mg by mouth 2 (two) times daily.      . potassium chloride SA (K-DUR,KLOR-CON) 20 MEQ tablet Take 20 mEq by mouth 2 (two) times daily.       . prochlorperazine (COMPAZINE) 10 MG tablet       . vitamin C (ASCORBIC ACID) 500 MG tablet Take 500 mg by mouth daily.      . Vitamin D, Ergocalciferol, (DRISDOL) 50000 UNITS CAPS Take 50,000 Units by mouth every evening.        No current facility-administered medications for this visit.   Allergies as of 04/01/2014 - Review Complete 04/01/2014  Allergen Reaction Noted  . Ace inhibitors Anaphylaxis 02/19/2013  . Tilade [nedocromil] Anaphylaxis  03/20/2012  . Reglan [metoclopramide] Nausea And Vomiting 09/05/2013  . Betadine [povidone iodine] Rash 08/19/2012    reports that she quit smoking about 36 years ago. Her smoking use included Cigarettes. She has a 10 pack-year smoking history. She has never used smokeless tobacco. She reports that she drinks alcohol. She reports that she does not use illicit drugs.     Review of Systems: Pertinent positive and negative review of systems were noted in the above HPI section. All other review of systems were otherwise negative.  Vital signs were reviewed in today's medical record Physical Exam: General: Well developed , well nourished, no acute distress Skin: anicteric Head: Normocephalic and atraumatic Eyes:  sclerae anicteric, EOMI Ears: Normal auditory acuity Mouth: No deformity or lesions Neck: Supple, no masses or thyromegaly Lungs: Clear throughout to auscultation Heart: Regular rate and rhythm; no murmurs, rubs or bruits Abdomen: Soft, non tender and non distended. No masses, hepatosplenomegaly or hernias noted. Normal Bowel sounds Rectal:deferred Musculoskeletal: Symmetrical with no gross deformities  Skin: No lesions on visible extremities Pulses:  Normal pulses noted Extremities: No clubbing, cyanosis, edema or deformities noted Neurological: Alert oriented x 4, grossly nonfocal Cervical Nodes:  No significant cervical adenopathy Inguinal Nodes: No significant inguinal adenopathy Psychological:  Alert and cooperative. Normal mood and affect  See Assessment and Plan under Problem List

## 2014-04-02 LAB — CANCER ANTIGEN 19-9: CA 19 9: 19.7 U/mL (ref <35.0)

## 2014-04-02 LAB — CEA: CEA: 12.3 ng/mL — AB (ref 0.0–5.0)

## 2014-04-02 LAB — AFP TUMOR MARKER: AFP TUMOR MARKER: 3.5 ng/mL (ref 0.0–8.0)

## 2014-04-04 ENCOUNTER — Ambulatory Visit
Admission: RE | Admit: 2014-04-04 | Discharge: 2014-04-04 | Disposition: A | Discharge status: Home / Self Care | Payer: BC Managed Care – PPO | Source: Ambulatory Visit

## 2014-04-04 DIAGNOSIS — Z1231 Encounter for screening mammogram for malignant neoplasm of breast: Secondary | ICD-10-CM

## 2014-04-06 ENCOUNTER — Ambulatory Visit (INDEPENDENT_AMBULATORY_CARE_PROVIDER_SITE_OTHER)
Admission: RE | Admit: 2014-04-06 | Discharge: 2014-04-06 | Disposition: A | Discharge status: Home / Self Care | Payer: BC Managed Care – PPO | Source: Ambulatory Visit | Attending: Gastroenterology | Admitting: Gastroenterology

## 2014-04-06 DIAGNOSIS — K769 Liver disease, unspecified: Secondary | ICD-10-CM

## 2014-04-06 DIAGNOSIS — R160 Hepatomegaly, not elsewhere classified: Secondary | ICD-10-CM

## 2014-04-06 MED ORDER — IOHEXOL 350 MG/ML SOLN
100.0000 mL | Freq: Once | INTRAVENOUS | Status: AC | PRN
  Administered 2014-04-06: 100 mL via INTRAVENOUS

## 2014-04-08 ENCOUNTER — Other Ambulatory Visit: Payer: Self-pay | Admitting: Internal Medicine

## 2014-04-15 ENCOUNTER — Other Ambulatory Visit: Payer: Self-pay | Admitting: Emergency Medicine

## 2014-04-20 ENCOUNTER — Other Ambulatory Visit (HOSPITAL_BASED_OUTPATIENT_CLINIC_OR_DEPARTMENT_OTHER): Payer: BC Managed Care – PPO

## 2014-04-20 ENCOUNTER — Ambulatory Visit (HOSPITAL_BASED_OUTPATIENT_CLINIC_OR_DEPARTMENT_OTHER): Payer: BC Managed Care – PPO | Admitting: Internal Medicine

## 2014-04-20 ENCOUNTER — Telehealth: Payer: Self-pay | Admitting: Internal Medicine

## 2014-04-20 VITALS — BP 154/80 | HR 85 | Temp 98.3°F | Resp 18 | Ht 67.0 in | Wt 165.4 lb

## 2014-04-20 DIAGNOSIS — C240 Malignant neoplasm of extrahepatic bile duct: Secondary | ICD-10-CM

## 2014-04-20 LAB — COMPREHENSIVE METABOLIC PANEL (CC13)
ALBUMIN: 3 g/dL — AB (ref 3.5–5.0)
ALT: 20 U/L (ref 0–55)
AST: 27 U/L (ref 5–34)
Alkaline Phosphatase: 138 U/L (ref 40–150)
Anion Gap: 15 mEq/L — ABNORMAL HIGH (ref 3–11)
BUN: 11.6 mg/dL (ref 7.0–26.0)
CALCIUM: 8.8 mg/dL (ref 8.4–10.4)
CHLORIDE: 99 meq/L (ref 98–109)
CO2: 25 mEq/L (ref 22–29)
Creatinine: 1.3 mg/dL — ABNORMAL HIGH (ref 0.6–1.1)
Glucose: 294 mg/dl — ABNORMAL HIGH (ref 70–140)
POTASSIUM: 3.3 meq/L — AB (ref 3.5–5.1)
Sodium: 138 mEq/L (ref 136–145)
TOTAL PROTEIN: 6.9 g/dL (ref 6.4–8.3)
Total Bilirubin: <0.2 mg/dL (ref 0.20–1.20)

## 2014-04-20 LAB — CBC WITH DIFFERENTIAL/PLATELET
BASO%: 0.4 % (ref 0.0–2.0)
Basophils Absolute: 0 10*3/uL (ref 0.0–0.1)
EOS%: 1.8 % (ref 0.0–7.0)
Eosinophils Absolute: 0.1 10*3/uL (ref 0.0–0.5)
HCT: 37 % (ref 34.8–46.6)
HEMOGLOBIN: 11.7 g/dL (ref 11.6–15.9)
LYMPH#: 1.2 10*3/uL (ref 0.9–3.3)
LYMPH%: 14.9 % (ref 14.0–49.7)
MCH: 27.2 pg (ref 25.1–34.0)
MCHC: 31.6 g/dL (ref 31.5–36.0)
MCV: 86 fL (ref 79.5–101.0)
MONO#: 0.8 10*3/uL (ref 0.1–0.9)
MONO%: 9.5 % (ref 0.0–14.0)
NEUT#: 5.8 10*3/uL (ref 1.5–6.5)
NEUT%: 73.4 % (ref 38.4–76.8)
Platelets: 306 10*3/uL (ref 145–400)
RBC: 4.3 10*6/uL (ref 3.70–5.45)
RDW: 16.4 % — AB (ref 11.2–14.5)
WBC: 7.9 10*3/uL (ref 3.9–10.3)

## 2014-04-20 NOTE — Telephone Encounter (Signed)
GV ANDPRINTED APPT SCHED AND AVS FOR PT FOR sePT °

## 2014-04-20 NOTE — Progress Notes (Signed)
[place], [place] [address] [place] [address]  DIAGNOSIS: Malignant neoplasm of extrahepatic bile ducts - Plan: CBC with Differential, Comprehensive metabolic panel (Cmet) - [facility], Lactate dehydrogenase (LDH) - [facility], Cancer antigen 19-9, CEA  Chief Complaint  Patient presents with  . Follow-up    CURRENT THERAPY:  Surveillance  INTERVAL HISTORY: Courtney Stevens 58 y.o. 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 She was last seen by me on 08/31/2013.  She was hospitalized from 12/03/2013 through 12/15/2013 due to fever, night sweats and abdominal symptoms. Her MRI of abdomen indicated a new peripherally enhancing mass posteromedially within the right hepatic lobe, highly worrisome for local recurrence or metastatic disease from the patient's bile duct adenocarcinoma. She had 2 biopsies which were negative for malignancy.  Her second liver biopsy (12/04/2013) demonstrated acute and chronic granulomatous inflammation with associated granulomata and  she was found to have antibody to Q fever.  She was discharged to complete a one month course of doxycycline per infectious disease (Dr. Linus Salmons).    She underwent a pancreaticoduodenectomy by Dr. Birdie Sons at [facility] on 02/22/2008.  She denies any additional recent emergency room visits or hospitalizations.  She reports good energy and appetite.  She saw her PCP Dr. Unk Pinto this past May.  She continues creon 3 tablets with meals.  Her weight has been stable.  She reports her last colonoscopy was less than one year ago with one tubulus villous polyp removed and she has to follow up for repeat colonoscopy in 3 years.   Today, she reports some constitutional symptoms such as night sweats.  She also has occasional  nausea/vomiting. She denies recurrent fevers or chills.  She did have a spider bite about 3 weeks ago which required antibiotics.  It bit her on her left arm.   She was referred to an infectious disease specialist from [place] for further evaluation of her her possible Q fever.  She reports that they were concern for idiopathic granulomata hepatitis and that given repeat Q fever testing that was negative, it was likely a false positive.  She was also seen by Dr. Deatra Ina who ordered a CT of her abdomen which demonstrated the liver lesions had decreased in size.  She now has a follow up with rheumatology.  MEDICAL HISTORY: Past Medical History  Diagnosis Date  . Cancer     bile duct ca  . GERD (gastroesophageal reflux disease)   . Diabetes mellitus   . Hypertension   . Hypothyroid   . Hyperlipidemia   . Asthma   . 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    ALLERGIES:  is allergic to ace inhibitors; tilade; reglan; and betadine.  MEDICATIONS: has a current medication list which includes the following prescription(s): alprazolam, calcium citrate-vitamin d, cetirizine, fluconazole, furosemide, glyburide, iron combinations, levothyroxine, lipase/protease/amylase, magnesium, metformin, multivitamin with minerals, omeprazole, potassium chloride  sa, prochlorperazine, vitamin c, and vitamin d (ergocalciferol).  SURGICAL HISTORY:   Past Surgical History  Procedure Laterality Date  . Gastric bypass  2004  . Cholecystectomy  1992  . Bile duct resection  2009  . Appendectomy    . Tonsillectomy    . Abdominal hysterectomy  1990    BIL OOPHORECTOMY  . Tubal ligation    . Breast surgery      BIOPSY NEG   PROBLEM LIST:  1. 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 The patient  underwent pancreaticoduodenectomy by Dr. Birdie Sons at [facility] on 02/22/2008. There was extensive involvement of peri-ductal soft tissue and extension to the duodenal wall and pancreas. Lymphovascular and perineural invasion was present. The posterior soft tissue margin was involved with tumor. Four out of 18 regional lymph nodes were involved by tumor. Tumor stage was pT4 pN1 pM0. Following surgery, the patient underwent radiation treatments in conjunction with continuous infusion 5 fluorouracil here in [place] from 04/01/2008 through 05/19/2008. She then received gemcitabine from 05/19/2008 through 12/02/2007. Because of a marked increase in CA19-9, the patient was felt to have a recurrence. This was not confirmed histologically and I believe imaging studies were also non-definitive. The patient received gemcitabine from 02/17/2009 through 05/25/2009. She then received Taxotere for 3 cycles from 06/15/2009 through 07/27/2009. The patient remains clinically disease-free on no treatment.  2. Elevated tumor markers. CEA and CA19-9 have been elevated with fluctuating levels.  3. Bilateral pulmonary nodules seen on CT scans from 02/19/2013.  4. History of malabsorption detected in early 2011, currently under  successful treatment.  5. Positive hepatitis B surface antibody and hepatitis B core  antibody.  6. Diabetes mellitus.  7. Hypertension.  8. Hypothyroidism diagnosed April 2011.  9. History of gastric bypass for obesity, Roux-en-Y, in  2004.  10.Diverticulosis.  11.Systolic ejection murmur.  REVIEW OF SYSTEMS:   Constitutional: Denies fevers, chills or abnormal weight loss Eyes: Denies blurriness of vision Ears, nose, mouth, throat, and face: Denies mucositis or sore throat Respiratory: Denies cough, dyspnea or wheezes Cardiovascular: Denies palpitation, chest discomfort or lower extremity swelling Gastrointestinal:  Denies nausea, heartburn or change in bowel habits Skin: Denies abnormal skin rashes Lymphatics: Denies new lymphadenopathy or easy bruising Neurological:Denies numbness, tingling or new weaknesses Behavioral/Psych: Mood is stable, no new changes  All other systems were reviewed with the patient and are negative.  PHYSICAL EXAMINATION: ECOG PERFORMANCE STATUS: 0 - Asymptomatic  Blood pressure 154/80, pulse 85, temperature 98.3 F (36.8 C), temperature source Oral, resp. rate 18, height 5\' 7"  (1.702 m), weight 165 lb 6.4 oz (75.025 kg), SpO2 100.00%.  GENERAL:alert, no distress and comfortable SKIN: skin color, texture, turgor are normal, no rashes or significant lesions EYES: normal, Conjunctiva are pink and non-injected, sclera clear OROPHARYNX:no exudate, no erythema and lips, buccal mucosa, and tongue normal  NECK: supple, thyroid normal size, non-tender, without nodularity LYMPH:  no palpable lymphadenopathy in the cervical, axillary or supraclavicular LUNGS: clear to auscultation and percussion with normal breathing effort HEART: regular rate & rhythm and SEM II/VI and no lower extremity  ABDOMEN:abdomen soft, non-tender and normal bowel sounds, post surgical scars well healed. No organomegaly appreciated.  Musculoskeletal:no cyanosis of digits and no clubbing  NEURO: alert & oriented x 3 with fluent speech, no focal motor/sensory deficits  Labs:  Lab Results  Component Value Date   WBC 7.9 04/20/2014   HGB 11.7 04/20/2014  HCT 37.0 04/20/2014   MCV 86.0 04/20/2014   PLT 306 04/20/2014    NEUTROABS 5.8 04/20/2014      Chemistry      Component Value Date/Time   NA 138 04/20/2014 1458   NA 139 03/24/2014 1544   K 3.3* 04/20/2014 1458   K 4.0 03/24/2014 1544   CL 101 03/24/2014 1544   CL 101 02/25/2013 0938   CO2 25 04/20/2014 1458   CO2 27 03/24/2014 1544   BUN 11.6 04/20/2014 1458   BUN 12 03/24/2014 1544   CREATININE 1.3* 04/20/2014 1458   CREATININE 0.94 03/24/2014 1544   CREATININE 0.87 12/14/2013 0500      Component Value Date/Time   CALCIUM 8.8 04/20/2014 1458   CALCIUM 9.3 03/24/2014 1544   ALKPHOS 138 04/20/2014 1458   ALKPHOS 113 03/24/2014 1544   AST 27 04/20/2014 1458   AST 27 03/24/2014 1544   ALT 20 04/20/2014 1458   ALT 18 03/24/2014 1544   BILITOT <0.20 04/20/2014 1458   BILITOT 0.2 03/24/2014 1544     Results for ALLIANNA, BEAUBIEN (MRN [id number]) as of 04/21/2014 06:01  Ref. Range 08/31/2013 10:32 11/25/2013 16:25 04/01/2014 09:48 04/20/2014 14:57  CEA Latest Range: 0.0-5.0 ng/mL 12.0 (H) 14.4 (H) 12.3 (H) 13.4 (H)   Results for MARQUEL, SPOTO (MRN [id number]) as of 04/21/2014 06:01  Ref. Range 08/31/2013 10:32 11/26/2013 04:54 04/01/2014 09:48 04/20/2014 14:57  CA 19-9 Latest Range: <35.0 U/mL 28.7 30.0 (L) 19.7 18.1   RADIOGRAPHIC STUDIES:  Personally reviewed by me.  04/06/2014  CT ABDOMEN AND PELVIS WITHOUT AND WITH CONTRAST  TECHNIQUE:  Multidetector CT imaging of the abdomen and pelvis was performed  following the standard protocol before and following the bolus  administration of intravenous contrast.  CONTRAST: 182mL OMNIPAQUE IOHEXOL 350 MG/ML SOLN  COMPARISON: CT scan 12/03/2013  FINDINGS:  The lung bases are clear of acute process. No pleural effusion or  pulmonary nodule. Patchy areas of subpleural atelectasis are noted.  The heart is normal in size. No pericardial effusion. Coronary  artery calcifications are noted.  Persistent but slightly smaller right hepatic lobe liver lesion. The  IVC it measures a maximum of 3.2 x 3.5 cm and previously measured  4.5 x 4.5 cm. Pneumobilia is  noted related to prior  choledochojejunostomy. No new hepatic lesions. Small scattered  periportal and mesenteric lymph nodes are noted. Stable surgical  changes from a Whipple procedure. The body and tail region of the  pancreas are atrophy. The spleen is normal in size. No focal lesions  she adrenal glands and kidneys are stable. Mild scarring changes  involving the upper pole region of the right kidney.  The stomach demonstrates stable surgical changes. No complicating  features. The small bowel and colon are unremarkable. No mesenteric  or retroperitoneal mass or adenopathy. The aorta is normal in  caliber. Scattered atherosclerotic calcifications.  The pelvis is unremarkable. The uterus is surgically absent. The  bladder appears normal. No pelvic mass, adenopathy or free pelvic  fluid collections. No inguinal mass or adenopathy. The bony  structures are unremarkable.  IMPRESSION:  Interval decrease in size of the right hepatic mass. No new lesions.  Stable surgical changes related to a Whipple procedure. No  complicating features.  No acute abdominal/ pelvic findings or adenopathy  ASSESSMENT: Courtney Stevens 58 y.o. female with a history of Malignant neoplasm of extrahepatic bile ducts - Plan: CBC with Differential, Comprehensive metabolic panel (Cmet) - [facility], Lactate dehydrogenase (LDH) -  [facility], Cancer antigen 19-9, CEA   PLAN:  1. Malignant neoplasm of extrahepatic bile ducts --She is without evidence for recurrent disease for over 5 years since the time of her diagnosis.  She had repeat imaging for the pulmonary nodules that showed resolution favoring a benign disease.  She was seen by Dr. Deatra Ina and CT of abdomen ordered as noted above.  Tumor markers as noted above.   2. Questionable Q Fever. --Management per Dr. Linus Salmons.  .  Her labs were collected and were sent to [facility] for verification of Q Fever did not confirm this per patient.  She was evaluated by Dr.  Shearon Balo of [facility]  who suggests that she may have idiopathic granulomatous disease of the liver.      3. Follow-up. --We will plan to see Courtney Stevens again in 2 months, at which time we will check CBC, chemistries, including CEA and CA 19-9.    All questions were answered. The patient knows to call the clinic with any problems, questions or concerns. We can certainly see the patient much sooner if necessary.  I spent 15 minutes counseling the patient face to face. The total time spent in the appointment was 30 minutes.    Concha Norway, MD 04/21/2014 6:05 AM

## 2014-04-21 LAB — CANCER ANTIGEN 19-9: CA 19-9: 18.1 U/mL (ref <35.0)

## 2014-04-21 LAB — CEA: CEA: 13.4 ng/mL — AB (ref 0.0–5.0)

## 2014-06-06 ENCOUNTER — Ambulatory Visit (INDEPENDENT_AMBULATORY_CARE_PROVIDER_SITE_OTHER): Payer: BC Managed Care – PPO | Admitting: Gastroenterology

## 2014-06-06 ENCOUNTER — Other Ambulatory Visit: Payer: BC Managed Care – PPO

## 2014-06-06 ENCOUNTER — Encounter: Payer: Self-pay | Admitting: Gastroenterology

## 2014-06-06 VITALS — BP 148/86 | HR 64 | Ht 67.0 in | Wt 165.2 lb

## 2014-06-06 DIAGNOSIS — R160 Hepatomegaly, not elsewhere classified: Secondary | ICD-10-CM

## 2014-06-06 DIAGNOSIS — C240 Malignant neoplasm of extrahepatic bile duct: Secondary | ICD-10-CM

## 2014-06-06 DIAGNOSIS — K769 Liver disease, unspecified: Secondary | ICD-10-CM

## 2014-06-06 DIAGNOSIS — K753 Granulomatous hepatitis, not elsewhere classified: Secondary | ICD-10-CM

## 2014-06-06 DIAGNOSIS — K7110 Toxic liver disease with hepatic necrosis, without coma: Secondary | ICD-10-CM

## 2014-06-06 LAB — HEPATIC FUNCTION PANEL
ALT: 38 U/L — ABNORMAL HIGH (ref 0–35)
AST: 47 U/L — AB (ref 0–37)
Albumin: 3.3 g/dL — ABNORMAL LOW (ref 3.5–5.2)
Alkaline Phosphatase: 123 U/L — ABNORMAL HIGH (ref 39–117)
BILIRUBIN TOTAL: 0.2 mg/dL (ref 0.2–1.2)
Bilirubin, Direct: 0.1 mg/dL (ref 0.0–0.3)
Total Protein: 6.7 g/dL (ref 6.0–8.3)

## 2014-06-06 NOTE — Patient Instructions (Signed)
Go to the basement today for labs You will need to follow up in 03/2015 with an office appointment and labs  (CEA,AFP,LFT's,CA19-9)

## 2014-06-06 NOTE — Assessment & Plan Note (Signed)
Mass was smaller by CT scan.  Plan to repeat LFTs in about 3 months.  If normal I will defer her CT scan until 2016.

## 2014-06-06 NOTE — Progress Notes (Signed)
          History of Present Illness:  Courtney Stevens has returned for followup of a liver mass.  Repeat scans in May, 2015 demonstrated decreased size of the mass.  LFTs and tumor markers were normal.  The patient has noted complaints including abdominal pain or fever.    Review of Systems: Pertinent positive and negative review of systems were noted in the above HPI section. All other review of systems were otherwise negative.    Current Medications, Allergies, Past Medical History, Past Surgical History, Family History and Social History were reviewed in [facility] record  Vital signs were reviewed in today's medical record. Physical Exam: General: Well developed , well nourished, no acute distress Skin: anicteric Abdomen is without masses, tenderness organomegaly   See Assessment and Plan under Problem List

## 2014-06-06 NOTE — Progress Notes (Signed)
Quick Note:  Please inform the patient that LFTs were normal and to continue current plan of action. Note that her blood sugar was high. This needs to be followed up by her PCP. ______

## 2014-06-06 NOTE — Assessment & Plan Note (Signed)
There is no evidence for recurrent disease.  Recommendations #1 followup tumor markers, LFTs and CT scan in about May, 2016

## 2014-06-19 ENCOUNTER — Other Ambulatory Visit: Payer: Self-pay | Admitting: Emergency Medicine

## 2014-06-23 ENCOUNTER — Other Ambulatory Visit (HOSPITAL_COMMUNITY): Payer: Self-pay | Admitting: Internal Medicine

## 2014-06-27 ENCOUNTER — Encounter: Payer: Self-pay | Admitting: Physician Assistant

## 2014-06-27 ENCOUNTER — Ambulatory Visit (INDEPENDENT_AMBULATORY_CARE_PROVIDER_SITE_OTHER): Payer: BC Managed Care – PPO | Admitting: Physician Assistant

## 2014-06-27 ENCOUNTER — Other Ambulatory Visit: Payer: Self-pay | Admitting: Emergency Medicine

## 2014-06-27 VITALS — BP 136/82 | HR 76 | Temp 98.1°F | Resp 16 | Ht 68.5 in | Wt 166.0 lb

## 2014-06-27 DIAGNOSIS — E039 Hypothyroidism, unspecified: Secondary | ICD-10-CM

## 2014-06-27 DIAGNOSIS — K769 Liver disease, unspecified: Secondary | ICD-10-CM

## 2014-06-27 DIAGNOSIS — E559 Vitamin D deficiency, unspecified: Secondary | ICD-10-CM

## 2014-06-27 DIAGNOSIS — Z79899 Other long term (current) drug therapy: Secondary | ICD-10-CM

## 2014-06-27 DIAGNOSIS — E1129 Type 2 diabetes mellitus with other diabetic kidney complication: Secondary | ICD-10-CM

## 2014-06-27 DIAGNOSIS — R7982 Elevated C-reactive protein (CRP): Secondary | ICD-10-CM

## 2014-06-27 DIAGNOSIS — I10 Essential (primary) hypertension: Secondary | ICD-10-CM

## 2014-06-27 DIAGNOSIS — E782 Mixed hyperlipidemia: Secondary | ICD-10-CM

## 2014-06-27 DIAGNOSIS — K753 Granulomatous hepatitis, not elsewhere classified: Secondary | ICD-10-CM

## 2014-06-27 LAB — CBC WITH DIFFERENTIAL/PLATELET
BASOS ABS: 0 10*3/uL (ref 0.0–0.1)
BASOS PCT: 0 % (ref 0–1)
Eosinophils Absolute: 0.2 10*3/uL (ref 0.0–0.7)
Eosinophils Relative: 3 % (ref 0–5)
HEMATOCRIT: 38.2 % (ref 36.0–46.0)
Hemoglobin: 12.6 g/dL (ref 12.0–15.0)
Lymphocytes Relative: 23 % (ref 12–46)
Lymphs Abs: 1.7 10*3/uL (ref 0.7–4.0)
MCH: 27.9 pg (ref 26.0–34.0)
MCHC: 33 g/dL (ref 30.0–36.0)
MCV: 84.7 fL (ref 78.0–100.0)
Monocytes Absolute: 0.6 10*3/uL (ref 0.1–1.0)
Monocytes Relative: 9 % (ref 3–12)
NEUTROS ABS: 4.7 10*3/uL (ref 1.7–7.7)
NEUTROS PCT: 65 % (ref 43–77)
Platelets: 331 10*3/uL (ref 150–400)
RBC: 4.51 MIL/uL (ref 3.87–5.11)
RDW: 14.8 % (ref 11.5–15.5)
WBC: 7.2 10*3/uL (ref 4.0–10.5)

## 2014-06-27 NOTE — Progress Notes (Signed)
Assessment and Plan:  Hypertension: Continue medication, monitor blood pressure at home. Continue DASH diet. Cholesterol: Continue diet and exercise. Check cholesterol.  Diabetes-Continue diet and exercise. Check A1C Vitamin D Def- check level and continue medications.  Recurrent fever, abnormal liver granuloma- will refer to rheumatology, check ESR, CRP, ACE, ceruloplasmins, has had neg TB, and infectious disease work up.  Edema- currently no symptoms today- will check labs, continue lasix.   Continue diet and meds as discussed. Further disposition pending results of labs. Discussed med's effects and SE's.  OVER 40 minutes of exam, counseling, chart review, referral performed   HPI 58 y.o. female  presents for 3 month follow up with hypertension, hyperlipidemia, diabetes and vitamin D. Her blood pressure has been controlled at home, today their BP is BP: 136/82 mmHg She does not workout. She denies chest pain, shortness of breath, dizziness.  She is not on cholesterol medication and denies myalgias. Her cholesterol is at goal. The cholesterol last visit was:   Lab Results  Component Value Date   CHOL 133 03/24/2014   HDL 63 03/24/2014   LDLCALC 50 03/24/2014   TRIG 100 03/24/2014   CHOLHDL 2.1 03/24/2014   She has been working on diet and exercise for Diabetes, and denies nausea, paresthesia of the feet and polydipsia. Last A1C in the office was:  Lab Results  Component Value Date   HGBA1C 6.8* 03/24/2014   Patient is on Vitamin D supplement. Lab Results  Component Value Date   VD25OH 30 03/24/2014     She is on thyroid medication. Her medication was not changed last visit. Patient denies nervousness, palpitations and weight changes.  Lab Results  Component Value Date   TSH 0.940 03/24/2014   She has a very complicated history starting with an adenocarcinoma arising from biliary source in 2009 treated with chemo/radiation, she continues to follow with Dr. Viviano Simas, oncologist.  She has had  recent fever of unknown origin, thought to be Q fever with initial lab equivocal and repeat test negative. During her testing in Jan she was found to have a liver nodule on CT scan, she has been following Dr. Deatra Ina for this which was shown to be liver granuloma, culture and malignancy negative. She has seen ID, Dr. Oda Cogan, at [facility] and has had a very broad infections disease work up that was negative. Her CRP was 5.5 on 03/03/2014 and ESR was 69, with negative ANA, RF.  She has been complaining of bilateral edema intermittently the last 2 months, better in the AM, worse in the after noon. She describes them has red, swollen, warm legs, shiny and painful. Elevated and compression stockings help.  She had one episode of fever of 101 and fatigue 4 days ago, took advil, rested and feel better. Has muscle cramps in feet/legs at night, denies joint pain.    Current Medications:  Current Outpatient Prescriptions on File Prior to Visit  Medication Sig Dispense Refill  . ALPRAZolam (XANAX) 1 MG tablet TAKE 1/2 TO 1 TABLET BY MOUTH UP TO 3 TIMES A DAY AS NEEDED FOR ANXIETY & SLEEP  90 tablet  1  . calcium citrate-vitamin D (CITRACAL+D) 315-200 MG-UNIT per tablet Take 1 tablet by mouth 3 (three) times daily.      . cetirizine (ZYRTEC) 10 MG tablet Take 10 mg by mouth every morning.        . furosemide (LASIX) 80 MG tablet TAKE 1/2 BID for fluid      . glyBURIDE (DIABETA) 5  MG tablet TAKE 2 TABLETS BY MOUTH DAILY WITH BREAKFAST.  60 tablet  3  . Iron Combinations (IRON COMPLEX PO) Take 1 tablet by mouth daily. Pt takes 40m iron       . levothyroxine (SYNTHROID, LEVOTHROID) 200 MCG tablet TAKE 1 alternating with 1/2 TABLET BY MOUTH EVERY DAY      . lipase/protease/amylase (CREON-10/PANCREASE) 12000 UNITS CPEP Take 1 capsule by mouth 3 (three) times daily before meals.        . Magnesium 250 MG TABS Take 1 tablet by mouth 4 (four) times daily.       . metFORMIN (GLUCOPHAGE) 500 MG tablet Take 500 mg by  mouth 3 (three) times daily with meals. One tab at breakfast, one tab at lunch and two tabs for dinner      . Multiple Vitamins-Minerals (MULTIVITAMIN WITH MINERALS) tablet Take 1 tablet by mouth as needed.       .Marland Kitchenomeprazole (PRILOSEC) 40 MG capsule Take 40 mg by mouth 2 (two) times daily.      . potassium chloride SA (K-DUR,KLOR-CON) 20 MEQ tablet Take 20 mEq by mouth 2 (two) times daily.       . vitamin C (ASCORBIC ACID) 500 MG tablet Take 500 mg by mouth daily.      . Vitamin D, Ergocalciferol, (DRISDOL) 50000 UNITS CAPS capsule TAKE 1 CAPSULE BY MOUTH DAILY  90 capsule  99   No current facility-administered medications on file prior to visit.   Medical History:  Past Medical History  Diagnosis Date  . Cancer     bile duct ca  . GERD (gastroesophageal reflux disease)   . Diabetes mellitus   . Hypertension   . Hypothyroid   . Hyperlipidemia   . Asthma   . Hepatitis    Allergies:  Allergies  Allergen Reactions  . Ace Inhibitors Anaphylaxis  . Tilade [Nedocromil] Anaphylaxis  . Reglan [Metoclopramide] Nausea And Vomiting  . Betadine [Povidone Iodine] Rash    iching      Review of Systems: _0  = complains of  _1  = denies  General: Fatigue _2  Fever [x ] Chills _3  Weakness _4   Insomnia _5  Eyes: Redness _6  Blurred vision _7  Diplopia _8   ENT: Congestion _9  Sinus Pain _10  Post Nasal Drip _11  Sore Throat _12  Earache _13   Cardiac: Chest pain/pressure _14  SOB _15  Orthopnea _16   Palpitations _17   Paroxysmal nocturnal dyspnea_18  Claudication _19  Edema [ x]  Pulmonary: Cough _20  Wheezing_21   SOB _22   Snoring _23   GI: Nausea _24  Vomiting_25  Dysphagia_26  Heartburn_27  Abdominal pain [x ] Constipation _28 ; Diarrhea _29 ; BRBPR _30  Melena_31  GU: Hematuria_32  Dysuria _33  Nocturia_34  Urgency _35   Hesitancy _36  Discharge _37  Neuro: Headaches_38  Vertigo_39  Paresthesias_40  Spasm _41  Speech changes _42  Incoordination _43   Ortho: Arthritis _44  Joint pain _45  Muscle pain [x ] Joint swelling  _46  Back Pain _47  Skin:  Rash _48   Pruritis _49  Change in skin lesion _50   Psych: Depression_51  Anxiety_52  Confusion _53  Memory loss _54   Heme/Lypmh: Bleeding _55  Bruising easily [x ] Enlarged lymph nodes _56   Endocrine: Visual blurring _57  Paresthesia _58  Polyuria _59  Polydypsea _60    Heat/cold intolerance _61  Hypoglycemia _62   Family history- Review and unchanged Social history- Review and unchanged Physical Exam: BP 136/82  Pulse 76  Temp(Src) 98.1 F (36.7 C)  Resp 16  Ht 5' 8.5" (1.74 m)  Wt 166 lb (75.297 kg)  BMI 24.87 kg/m2 Wt Readings from Last 3 Encounters:  06/27/14 166 lb (75.297 kg)  06/06/14 165 lb 3.2 oz (74.934 kg)  04/20/14 165 lb 6.4 oz (75.025 kg)   General Appearance: Well nourished, in no apparent distress. Eyes: PERRLA, EOMs, conjunctiva no swelling or erythema Sinuses: No Frontal/maxillary tenderness ENT/Mouth: Ext aud canals clear, TMs without erythema, bulging. No erythema, swelling, or exudate on post pharynx.  Tonsils not swollen or erythematous. Hearing normal.  Neck: Supple, thyroid normal.  Respiratory: Respiratory effort normal, BS equal bilaterally without rales, rhonchi, wheezing or stridor.  Cardio: RRR with no MRGs. Brisk peripheral pulses without edema.  Abdomen: Soft, + BS.  Non tender, no guarding, rebound, hernias, masses. Lymphatics: Non tender without lymphadenopathy.  Musculoskeletal: Full ROM, 5/5 strength, normal gait.  Skin: Warm, dry without rashes, lesions, ecchymosis.  Neuro: Cranial nerves intact. No cerebellar symptoms. Sensation intact.  Psych: Awake and oriented X 3, normal affect, Insight and Judgment appropriate.    Vicie Mutters 4:18 PM

## 2014-06-27 NOTE — Patient Instructions (Signed)

## 2014-06-28 LAB — ANCA TITERS: C-ANCA: 1:320 {titer} — ABNORMAL HIGH

## 2014-06-28 LAB — SEDIMENTATION RATE: Sed Rate: 27 mm/hr — ABNORMAL HIGH (ref 0–22)

## 2014-06-28 LAB — ANCA SCREEN W REFLEX TITER
ATYPICAL P-ANCA SCREEN: NEGATIVE
P-ANCA SCREEN: NEGATIVE
c-ANCA Screen: POSITIVE — AB

## 2014-06-28 LAB — BASIC METABOLIC PANEL WITH GFR
BUN: 10 mg/dL (ref 6–23)
CHLORIDE: 99 meq/L (ref 96–112)
CO2: 29 mEq/L (ref 19–32)
Calcium: 8.8 mg/dL (ref 8.4–10.5)
Creat: 0.89 mg/dL (ref 0.50–1.10)
GFR, EST AFRICAN AMERICAN: 82 mL/min
GFR, EST NON AFRICAN AMERICAN: 71 mL/min
Glucose, Bld: 108 mg/dL — ABNORMAL HIGH (ref 70–99)
POTASSIUM: 3.6 meq/L (ref 3.5–5.3)
SODIUM: 141 meq/L (ref 135–145)

## 2014-06-28 LAB — HEMOGLOBIN A1C
Hgb A1c MFr Bld: 6.8 % — ABNORMAL HIGH (ref <5.7)
Mean Plasma Glucose: 148 mg/dL — ABNORMAL HIGH (ref <117)

## 2014-06-28 LAB — HEPATIC FUNCTION PANEL
ALK PHOS: 205 U/L — AB (ref 39–117)
ALT: 41 U/L — ABNORMAL HIGH (ref 0–35)
AST: 43 U/L — ABNORMAL HIGH (ref 0–37)
Albumin: 3.8 g/dL (ref 3.5–5.2)
BILIRUBIN DIRECT: 0.1 mg/dL (ref 0.0–0.3)
BILIRUBIN INDIRECT: 0.1 mg/dL — AB (ref 0.2–1.2)
BILIRUBIN TOTAL: 0.2 mg/dL (ref 0.2–1.2)
Total Protein: 6.8 g/dL (ref 6.0–8.3)

## 2014-06-28 LAB — LIPID PANEL
CHOL/HDL RATIO: 2.4 ratio
Cholesterol: 145 mg/dL (ref 0–200)
HDL: 61 mg/dL (ref >39)
LDL CALC: 61 mg/dL (ref 0–99)
TRIGLYCERIDES: 117 mg/dL (ref <150)
VLDL: 23 mg/dL (ref 0–40)

## 2014-06-28 LAB — MAGNESIUM: MAGNESIUM: 2.1 mg/dL (ref 1.5–2.5)

## 2014-06-28 LAB — VITAMIN D 25 HYDROXY (VIT D DEFICIENCY, FRACTURES): Vit D, 25-Hydroxy: 69 ng/mL (ref 30–89)

## 2014-06-28 LAB — ANGIOTENSIN CONVERTING ENZYME: Angiotensin-Converting Enzyme: 77 U/L — ABNORMAL HIGH (ref 8–52)

## 2014-06-28 LAB — C-REACTIVE PROTEIN: CRP: 1.2 mg/dL — ABNORMAL HIGH (ref <0.60)

## 2014-06-28 LAB — TSH: TSH: 2.148 u[IU]/mL (ref 0.350–4.500)

## 2014-06-29 LAB — MITOCHONDRIAL/SMOOTH MUSCLE AB PNL
MITOCHONDRIAL M2 AB, IGG: 0.35 (ref <0.91)
SMOOTH MUSCLE AB: 4 U (ref <20)

## 2014-06-29 LAB — CERULOPLASMIN: Ceruloplasmin: 26 mg/dL (ref 18–53)

## 2014-07-01 ENCOUNTER — Telehealth: Payer: Self-pay | Admitting: Internal Medicine

## 2014-07-01 NOTE — Telephone Encounter (Signed)
call day 9/3 sehbai/cp1 - moved appt to am - s/w pt she is aware.

## 2014-07-06 ENCOUNTER — Other Ambulatory Visit: Payer: Self-pay | Admitting: Emergency Medicine

## 2014-07-15 ENCOUNTER — Other Ambulatory Visit: Payer: Self-pay | Admitting: Internal Medicine

## 2014-07-21 ENCOUNTER — Other Ambulatory Visit (HOSPITAL_BASED_OUTPATIENT_CLINIC_OR_DEPARTMENT_OTHER): Payer: BC Managed Care – PPO

## 2014-07-21 ENCOUNTER — Ambulatory Visit (HOSPITAL_BASED_OUTPATIENT_CLINIC_OR_DEPARTMENT_OTHER): Payer: BC Managed Care – PPO | Admitting: Hematology

## 2014-07-21 VITALS — BP 161/73 | HR 68 | Temp 98.3°F | Resp 18 | Ht 68.5 in | Wt 165.6 lb

## 2014-07-21 DIAGNOSIS — C240 Malignant neoplasm of extrahepatic bile duct: Secondary | ICD-10-CM

## 2014-07-21 DIAGNOSIS — Z8509 Personal history of malignant neoplasm of other digestive organs: Secondary | ICD-10-CM

## 2014-07-21 DIAGNOSIS — C221 Intrahepatic bile duct carcinoma: Secondary | ICD-10-CM

## 2014-07-21 DIAGNOSIS — E876 Hypokalemia: Secondary | ICD-10-CM

## 2014-07-21 LAB — COMPREHENSIVE METABOLIC PANEL (CC13)
ALT: 28 U/L (ref 0–55)
AST: 29 U/L (ref 5–34)
Albumin: 3.5 g/dL (ref 3.5–5.0)
Alkaline Phosphatase: 168 U/L — ABNORMAL HIGH (ref 40–150)
Anion Gap: 12 mEq/L — ABNORMAL HIGH (ref 3–11)
BILIRUBIN TOTAL: 0.31 mg/dL (ref 0.20–1.20)
BUN: 11 mg/dL (ref 7.0–26.0)
CO2: 28 mEq/L (ref 22–29)
Calcium: 9.7 mg/dL (ref 8.4–10.4)
Chloride: 101 mEq/L (ref 98–109)
Creatinine: 0.9 mg/dL (ref 0.6–1.1)
GLUCOSE: 67 mg/dL — AB (ref 70–140)
Potassium: 3.7 mEq/L (ref 3.5–5.1)
SODIUM: 140 meq/L (ref 136–145)
Total Protein: 7.8 g/dL (ref 6.4–8.3)

## 2014-07-21 LAB — LACTATE DEHYDROGENASE (CC13): LDH: 189 U/L (ref 125–245)

## 2014-07-21 LAB — CBC WITH DIFFERENTIAL/PLATELET
BASO%: 0.2 % (ref 0.0–2.0)
BASOS ABS: 0 10*3/uL (ref 0.0–0.1)
EOS%: 2.5 % (ref 0.0–7.0)
Eosinophils Absolute: 0.2 10*3/uL (ref 0.0–0.5)
HCT: 43.2 % (ref 34.8–46.6)
HEMOGLOBIN: 14.4 g/dL (ref 11.6–15.9)
LYMPH%: 18.4 % (ref 14.0–49.7)
MCH: 28.9 pg (ref 25.1–34.0)
MCHC: 33.3 g/dL (ref 31.5–36.0)
MCV: 86.6 fL (ref 79.5–101.0)
MONO#: 0.7 10*3/uL (ref 0.1–0.9)
MONO%: 7.8 % (ref 0.0–14.0)
NEUT#: 6 10*3/uL (ref 1.5–6.5)
NEUT%: 71.1 % (ref 38.4–76.8)
Platelets: 251 10*3/uL (ref 145–400)
RBC: 4.99 10*6/uL (ref 3.70–5.45)
RDW: 14.6 % — ABNORMAL HIGH (ref 11.2–14.5)
WBC: 8.5 10*3/uL (ref 3.9–10.3)
lymph#: 1.6 10*3/uL (ref 0.9–3.3)
nRBC: 0 % (ref 0–0)

## 2014-07-22 LAB — CANCER ANTIGEN 19-9: CA 19-9: 20.4 U/mL (ref <35.0)

## 2014-07-22 LAB — CEA: CEA: 10.3 ng/mL — AB (ref 0.0–5.0)

## 2014-07-25 NOTE — Progress Notes (Signed)
[place] ONCOLOGY OFFICE PROGRESS NOTE  07/21/2014  Courtney Richards, MD [address] [place] [address]  DIAGNOSIS: Malignant Neoplasm of extrahepatic Bile ducts.  Chief Complaint  Patient presents with  . Follow-up    CURRENT THERAPY:  Surveillance  INTERVAL HISTORY:  Courtney Stevens 58 y.o. 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 She was last seen by Dr Juliann Mule on 04/20/2014. She was hospitalized from 12/03/2013 through 12/15/2013 due to fever, night sweats and abdominal symptoms. Her MRI of abdomen indicated a new peripherally enhancing mass posteromedially within the right hepatic lobe, highly worrisome for local recurrence or metastatic disease from the patient's bile duct adenocarcinoma. She had 2 biopsies which were negative for malignancy.  Her second liver biopsy (12/04/2013) demonstrated acute and chronic granulomatous inflammation with associated granulomata and  she was found to have antibody to Q fever.  She was discharged to complete a one month course of doxycycline per infectious disease (Dr. Linus Stevens).    She underwent a pancreaticoduodenectomy by Dr. Birdie Stevens at [facility] center on 02/22/2008.  She denies any additional recent emergency room visits or hospitalizations.  She reports good energy and appetite.  She saw her PCP Dr. Unk Stevens this past May.  She continues creon 3 tablets with meals.  Her weight has been stable.  She reports her last colonoscopy was less than one year ago with one tubulus villous polyp removed and she has to follow up for repeat colonoscopy in 3 years.   Today, she feels well, no nausea, vomiting, jaundice, abdominal pain or weight loss. She denies recurrent fevers or chills.  She did have a spider bite back in May 2015 which required antibiotics.  It bit her on her  left arm.  She was referred to an infectious disease specialist from [place] for further evaluation of her possible Q fever.  She reports that they were concern for idiopathic granulomata hepatitis and that given repeat Q fever testing that was negative, it was likely a false positive.  She was also seen by Dr. Deatra Stevens who ordered a CT of her abdomen which demonstrated the liver lesions had decreased in size.     She had a follow up with Dr Courtney Stevens. Courtney Cao, MD Chief of Rheumatology and Immunology at [facility] on 07/06/2014 and was found to have low potassium 2.9 and was told to take 60 meq KCL. All her other labs and x-ray studies were either negative or normal. The c-ANCA was tested negative at [facility] lab and Dr Francella Solian. Courtney Stevens was not worried about possibility of polyangiitis with granulomatosis. There was no signs of active vasculitis.  Tests which were done and she brought a copy for me included Urine random protein <12 mg/dl, yrine random creatinine <200 mg/dl, UA showing no protein, CXR (07/06/2014) showing no acute cardiopulmonary disease, a CBC which was normal, BMP showing low K+ 2.9 but normal electrolytes and creatinine at 1.0 mg, CRP 0.17 mg normal, c-ANCA negative, C3 129, C4 27. They recommended a 3 month follow up.   MEDICAL HISTORY: Past Medical History  Diagnosis Date  . Cancer     bile duct ca  . GERD (gastroesophageal reflux disease)   . Diabetes mellitus   . Hypertension   . Hypothyroid   . Hyperlipidemia   . Asthma   . 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    ALLERGIES:  is allergic to ace inhibitors; tilade; reglan; and betadine.  MEDICATIONS: has a current medication list which includes the following prescription(s): alprazolam, aspirin, calcium citrate-vitamin d, calcium-magnesium-zinc, cetirizine, furosemide, glyburide, iron combinations, levothyroxine, lipase/protease/amylase, magnesium, metformin, multivitamin with minerals, multiple vitamins-minerals, omeprazole, potassium chloride sa, vitamin c, and vitamin d (ergocalciferol).  SURGICAL HISTORY:  Past Surgical History  Procedure Laterality Date  . Gastric bypass  2004  . Cholecystectomy  1992  . Bile duct resection  2009  . Appendectomy    . Tonsillectomy    . Abdominal hysterectomy  1990    BIL OOPHORECTOMY  . Tubal ligation    . Breast surgery      BIOPSY NEG   PROBLEM LIST:  1. 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 The patient underwent a pancreaticoduodenectomy by Dr. Birdie Stevens at [facility] on 02/22/2008. There was extensive involvement of peri-ductal soft tissue and extension to the duodenal wall and pancreas. Lymphovascular and perineural invasion was present. The posterior soft tissue margin was involved with tumor. Four out of 18 regional lymph nodes were involved by tumor. Tumor stage was pT4 pN1 pM0. Following surgery, the patient underwent radiation treatments in conjunction with continuous infusion 5 fluorouracil here in [place] from 04/01/2008 through 05/19/2008. She then received gemcitabine from 05/19/2008 through 12/02/2007. Because of a marked increase in CA19-9, the patient was felt to have a recurrence. This was not confirmed histologically and I believe imaging studies were also non-definitive. The patient  received gemcitabine from 02/17/2009 through 05/25/2009. She then received Taxotere for 3 cycles from 06/15/2009 through 07/27/2009. The patient remains clinically disease-free on no treatment.  2. Elevated tumor markers. CEA and CA19-9 have been elevated with fluctuating levels.  3. Bilateral pulmonary nodules seen on CT scans from 02/19/2013.  4. History of malabsorption detected in early 2011, currently under  successful treatment.  5. Positive hepatitis B surface antibody and hepatitis B core  antibody.  6. Diabetes mellitus.  7. Hypertension.  8. Hypothyroidism diagnosed April 2011.  9. History of gastric bypass for obesity, Roux-en-Y, in 2004.  10.Diverticulosis.  11.Systolic ejection murmur. 12. Some kind of vasculitis (Polyangiitis or Wegener's granulomatosis) recent serologies negative.  REVIEW OF SYSTEMS:   Constitutional: Denies fevers, chills or abnormal weight loss Eyes: Denies blurriness of vision Ears, nose, mouth, throat, and face: Denies mucositis or sore throat Respiratory: Denies cough, dyspnea or wheezes Cardiovascular: Denies palpitation, chest discomfort or lower extremity swelling Gastrointestinal:  Denies nausea, heartburn or change in bowel habits Skin: Denies abnormal skin rashes Lymphatics: Denies new lymphadenopathy or easy bruising Neurological:Denies numbness, tingling or new weaknesses Behavioral/Psych: Mood is stable, no new changes  All other systems were reviewed with the patient and are negative.  PHYSICAL EXAMINATION: ECOG PERFORMANCE STATUS: 0 - Asymptomatic  Blood pressure 161/73, pulse 68, temperature 98.3 F (36.8 C), temperature source Oral, resp. rate 18, height 5' 8.5" (1.74 m), weight 165 lb 9.6 oz (75.116 kg), SpO2 100.00%.  GENERAL:alert, no distress  and comfortable SKIN: skin color, texture, turgor are normal, no rashes or significant lesions EYES: normal, Conjunctiva are pink and non-injected, sclera clear OROPHARYNX:no exudate, no  erythema and lips, buccal mucosa, and tongue normal  NECK: supple, thyroid normal size, non-tender, without nodularity LYMPH:  no palpable lymphadenopathy in the cervical, axillary or supraclavicular LUNGS: clear to auscultation and percussion with normal breathing effort HEART: regular rate & rhythm and SEM II/VI and no lower extremity  ABDOMEN:abdomen soft, non-tender and normal bowel sounds, post surgical scars well healed. No organomegaly appreciated.  Musculoskeletal:no cyanosis of digits and no clubbing  NEURO: alert & oriented x 3 with fluent speech, no focal motor/sensory deficits  Labs:                  RADIOGRAPHIC STUDIES:  Personally reviewed by me. 04/06/2014  CT ABDOMEN AND PELVIS WITHOUT AND WITH CONTRAST TECHNIQUE: Multidetector CT imaging of the abdomen and pelvis was performed  following the standard protocol before and following the bolus administration of intravenous contrast. CONTRAST: 159mL OMNIPAQUE IOHEXOL 350 MG/ML SOLN COMPARISON: CT scan 12/03/2013  FINDINGS: The lung bases are clear of acute process. No pleural effusion or pulmonary nodule. Patchy areas of subpleural atelectasis are noted.  The heart is normal in size. No pericardial effusion. Coronary artery calcifications are noted. Persistent but slightly smaller right hepatic lobe liver lesion. The IVC it measures a maximum of 3.2 x 3.5 cm and previously measured 4.5 x 4.5 cm. Pneumobilia is noted related to prior choledochojejunostomy. No new hepatic lesions. Small scattered periportal and mesenteric lymph nodes are noted. Stable surgical changes from a Whipple procedure. The body and tail region of the pancreas are atrophy. The spleen is normal in size. No focal lesions she adrenal glands and kidneys are stable. Mild scarring changes involving the upper pole region of the right Stevens. The stomach demonstrates stable surgical changes. No complicating features. The small bowel and colon are unremarkable.  No mesenteric or retroperitoneal mass or adenopathy. The aorta is normal in caliber. Scattered atherosclerotic calcifications. The pelvis is unremarkable. The uterus is surgically absent. The bladder appears normal. No pelvic mass, adenopathy or free pelvic fluid collections. No inguinal mass or adenopathy. The bony structures are unremarkable.   IMPRESSION:  Interval decrease in size of the right hepatic mass. No new lesions. Stable surgical changes related to a Whipple procedure. No complicating features. No acute abdominal/ pelvic findings or adenopathy       ASSESSMENT: Ailene Ravel 58 y.o. female with a history of No diagnosis found.   PLAN:   1. Malignant neoplasm of extrahepatic bile ducts --She is without evidence for recurrent disease for over 5 years since the time of her diagnosis.  She had repeat imaging for the pulmonary nodules that showed resolution favoring a benign disease.  She was seen by Dr. Deatra Stevens and CT of abdomen ordered as noted above.  Tumor markers as noted above.   2. Questionable Q Fever or Vasculitis. --Management per Dr. Linus Stevens.  .  Her labs were collected and were sent to [facility] for verification of Q Fever did not confirm this per patient.  She was evaluated by Dr.  Shearon Balo of [place] who suggests that she may have idiopathic granulomatous disease of the liver.    Liver biopsy have confirmed that it is benign done on 12/04/2013.     3. Hypokalemia: Her potassium today was 3.7 and improved from 2.9. I told to take 60 meq x 1 more week than  drop to 20 meq daily KCL.   4. Follow-up. --We will plan to see Mrs. Turnage again in 3 months, at which time we will check CBC, chemistries, including CEA and CA 19-9.    All questions were answered. The patient knows to call the clinic with any problems, questions or concerns. We can certainly see the patient much sooner if necessary.  I spent 30 minutes counseling the patient face to face. The total time spent in the  appointment was 40 minutes.    Bernadene Bell, MD Medical Hematologist/Oncologist [place] Pager: [phone] Office No: [phone]

## 2014-07-27 ENCOUNTER — Telehealth: Payer: Self-pay | Admitting: Internal Medicine

## 2014-07-27 NOTE — Telephone Encounter (Signed)
s.w. pt and advised on Dec appt.Marland KitchenMarland Kitchenpt was already aware from my chart.

## 2014-08-02 ENCOUNTER — Ambulatory Visit (INDEPENDENT_AMBULATORY_CARE_PROVIDER_SITE_OTHER): Payer: BC Managed Care – PPO | Admitting: Internal Medicine

## 2014-08-02 ENCOUNTER — Inpatient Hospital Stay (HOSPITAL_COMMUNITY)
Admission: EM | Admit: 2014-08-02 | Discharge: 2014-08-05 | DRG: 375 | Disposition: A | Discharge status: Home / Self Care | Payer: BC Managed Care – PPO | Attending: Internal Medicine | Admitting: Internal Medicine

## 2014-08-02 ENCOUNTER — Encounter: Payer: Self-pay | Admitting: Internal Medicine

## 2014-08-02 ENCOUNTER — Encounter (HOSPITAL_COMMUNITY): Payer: Self-pay | Admitting: Emergency Medicine

## 2014-08-02 ENCOUNTER — Emergency Department (HOSPITAL_COMMUNITY): Payer: BC Managed Care – PPO

## 2014-08-02 VITALS — BP 132/78 | HR 76 | Temp 97.9°F | Resp 16 | Ht 67.0 in | Wt 172.0 lb

## 2014-08-02 DIAGNOSIS — D509 Iron deficiency anemia, unspecified: Secondary | ICD-10-CM | POA: Diagnosis present

## 2014-08-02 DIAGNOSIS — J189 Pneumonia, unspecified organism: Secondary | ICD-10-CM

## 2014-08-02 DIAGNOSIS — D490 Neoplasm of unspecified behavior of digestive system: Secondary | ICD-10-CM | POA: Diagnosis not present

## 2014-08-02 DIAGNOSIS — R109 Unspecified abdominal pain: Secondary | ICD-10-CM

## 2014-08-02 DIAGNOSIS — R1011 Right upper quadrant pain: Secondary | ICD-10-CM

## 2014-08-02 DIAGNOSIS — E782 Mixed hyperlipidemia: Secondary | ICD-10-CM

## 2014-08-02 DIAGNOSIS — I10 Essential (primary) hypertension: Secondary | ICD-10-CM | POA: Diagnosis present

## 2014-08-02 DIAGNOSIS — Z8509 Personal history of malignant neoplasm of other digestive organs: Secondary | ICD-10-CM | POA: Diagnosis not present

## 2014-08-02 DIAGNOSIS — J45909 Unspecified asthma, uncomplicated: Secondary | ICD-10-CM | POA: Diagnosis present

## 2014-08-02 DIAGNOSIS — E038 Other specified hypothyroidism: Secondary | ICD-10-CM

## 2014-08-02 DIAGNOSIS — E039 Hypothyroidism, unspecified: Secondary | ICD-10-CM | POA: Diagnosis present

## 2014-08-02 DIAGNOSIS — Z87891 Personal history of nicotine dependence: Secondary | ICD-10-CM | POA: Diagnosis not present

## 2014-08-02 DIAGNOSIS — R1084 Generalized abdominal pain: Secondary | ICD-10-CM

## 2014-08-02 DIAGNOSIS — R509 Fever, unspecified: Secondary | ICD-10-CM

## 2014-08-02 DIAGNOSIS — Z79899 Other long term (current) drug therapy: Secondary | ICD-10-CM | POA: Diagnosis not present

## 2014-08-02 DIAGNOSIS — E119 Type 2 diabetes mellitus without complications: Secondary | ICD-10-CM | POA: Diagnosis present

## 2014-08-02 DIAGNOSIS — D638 Anemia in other chronic diseases classified elsewhere: Secondary | ICD-10-CM | POA: Diagnosis present

## 2014-08-02 DIAGNOSIS — K219 Gastro-esophageal reflux disease without esophagitis: Secondary | ICD-10-CM | POA: Diagnosis present

## 2014-08-02 DIAGNOSIS — K769 Liver disease, unspecified: Secondary | ICD-10-CM | POA: Diagnosis present

## 2014-08-02 DIAGNOSIS — E785 Hyperlipidemia, unspecified: Secondary | ICD-10-CM | POA: Diagnosis present

## 2014-08-02 DIAGNOSIS — R6510 Systemic inflammatory response syndrome (SIRS) of non-infectious origin without acute organ dysfunction: Secondary | ICD-10-CM

## 2014-08-02 DIAGNOSIS — E876 Hypokalemia: Secondary | ICD-10-CM | POA: Diagnosis present

## 2014-08-02 DIAGNOSIS — K7110 Toxic liver disease with hepatic necrosis, without coma: Secondary | ICD-10-CM

## 2014-08-02 DIAGNOSIS — I871 Compression of vein: Secondary | ICD-10-CM | POA: Diagnosis present

## 2014-08-02 DIAGNOSIS — Z800 Family history of malignant neoplasm of digestive organs: Secondary | ICD-10-CM

## 2014-08-02 DIAGNOSIS — E559 Vitamin D deficiency, unspecified: Secondary | ICD-10-CM

## 2014-08-02 DIAGNOSIS — Z9884 Bariatric surgery status: Secondary | ICD-10-CM

## 2014-08-02 DIAGNOSIS — E1129 Type 2 diabetes mellitus with other diabetic kidney complication: Secondary | ICD-10-CM

## 2014-08-02 DIAGNOSIS — Z7982 Long term (current) use of aspirin: Secondary | ICD-10-CM | POA: Diagnosis not present

## 2014-08-02 DIAGNOSIS — Z86010 Personal history of colonic polyps: Secondary | ICD-10-CM

## 2014-08-02 DIAGNOSIS — K8689 Other specified diseases of pancreas: Secondary | ICD-10-CM

## 2014-08-02 DIAGNOSIS — R918 Other nonspecific abnormal finding of lung field: Secondary | ICD-10-CM

## 2014-08-02 DIAGNOSIS — K753 Granulomatous hepatitis, not elsewhere classified: Secondary | ICD-10-CM | POA: Diagnosis present

## 2014-08-02 DIAGNOSIS — C240 Malignant neoplasm of extrahepatic bile duct: Secondary | ICD-10-CM

## 2014-08-02 LAB — COMPREHENSIVE METABOLIC PANEL
ALBUMIN: 3.3 g/dL — AB (ref 3.5–5.2)
ALT: 29 U/L (ref 0–35)
AST: 32 U/L (ref 0–37)
Alkaline Phosphatase: 201 U/L — ABNORMAL HIGH (ref 39–117)
Anion gap: 18 — ABNORMAL HIGH (ref 5–15)
BUN: 13 mg/dL (ref 6–23)
CALCIUM: 9.3 mg/dL (ref 8.4–10.5)
CHLORIDE: 91 meq/L — AB (ref 96–112)
CO2: 26 meq/L (ref 19–32)
Creatinine, Ser: 0.93 mg/dL (ref 0.50–1.10)
GFR calc Af Amer: 76 mL/min — ABNORMAL LOW (ref >90)
GFR, EST NON AFRICAN AMERICAN: 66 mL/min — AB (ref >90)
Glucose, Bld: 61 mg/dL — ABNORMAL LOW (ref 70–99)
Potassium: 3.6 mEq/L — ABNORMAL LOW (ref 3.7–5.3)
SODIUM: 135 meq/L — AB (ref 137–147)
Total Bilirubin: 0.3 mg/dL (ref 0.3–1.2)
Total Protein: 8.1 g/dL (ref 6.0–8.3)

## 2014-08-02 LAB — CBC WITH DIFFERENTIAL/PLATELET
BASOS ABS: 0 10*3/uL (ref 0.0–0.1)
Basophils Relative: 0 % (ref 0–1)
EOS PCT: 0 % (ref 0–5)
Eosinophils Absolute: 0 10*3/uL (ref 0.0–0.7)
HEMATOCRIT: 42.6 % (ref 36.0–46.0)
Hemoglobin: 13.8 g/dL (ref 12.0–15.0)
LYMPHS ABS: 1.3 10*3/uL (ref 0.7–4.0)
Lymphocytes Relative: 9 % — ABNORMAL LOW (ref 12–46)
MCH: 28.3 pg (ref 26.0–34.0)
MCHC: 32.4 g/dL (ref 30.0–36.0)
MCV: 87.5 fL (ref 78.0–100.0)
MONOS PCT: 8 % (ref 3–12)
Monocytes Absolute: 1.1 10*3/uL — ABNORMAL HIGH (ref 0.1–1.0)
Neutro Abs: 11.5 10*3/uL — ABNORMAL HIGH (ref 1.7–7.7)
Neutrophils Relative %: 83 % — ABNORMAL HIGH (ref 43–77)
Platelets: 268 10*3/uL (ref 150–400)
RBC: 4.87 MIL/uL (ref 3.87–5.11)
RDW: 14.3 % (ref 11.5–15.5)
WBC: 13.9 10*3/uL — ABNORMAL HIGH (ref 4.0–10.5)

## 2014-08-02 LAB — AMMONIA: Ammonia: 19 umol/L (ref 11–60)

## 2014-08-02 LAB — URINALYSIS, ROUTINE W REFLEX MICROSCOPIC
BILIRUBIN URINE: NEGATIVE
Glucose, UA: NEGATIVE mg/dL
HGB URINE DIPSTICK: NEGATIVE
Ketones, ur: NEGATIVE mg/dL
Leukocytes, UA: NEGATIVE
Nitrite: NEGATIVE
PROTEIN: NEGATIVE mg/dL
Specific Gravity, Urine: 1.013 (ref 1.005–1.030)
Urobilinogen, UA: 0.2 mg/dL (ref 0.0–1.0)
pH: 7 (ref 5.0–8.0)

## 2014-08-02 LAB — CBG MONITORING, ED
Glucose-Capillary: 69 mg/dL — ABNORMAL LOW (ref 70–99)
Glucose-Capillary: 224 mg/dL — ABNORMAL HIGH (ref 70–99)

## 2014-08-02 LAB — GLUCOSE, CAPILLARY: Glucose-Capillary: 190 mg/dL — ABNORMAL HIGH (ref 70–99)

## 2014-08-02 LAB — I-STAT TROPONIN, ED: Troponin i, poc: 0 ng/mL (ref 0.00–0.08)

## 2014-08-02 LAB — MAGNESIUM: MAGNESIUM: 2.1 mg/dL (ref 1.5–2.5)

## 2014-08-02 LAB — PROTIME-INR
INR: 1.06 (ref 0.00–1.49)
Prothrombin Time: 13.8 seconds (ref 11.6–15.2)

## 2014-08-02 LAB — I-STAT CG4 LACTIC ACID, ED: LACTIC ACID, VENOUS: 1.35 mmol/L (ref 0.5–2.2)

## 2014-08-02 LAB — APTT: APTT: 39 s — AB (ref 24–37)

## 2014-08-02 LAB — LIPASE, BLOOD: Lipase: 7 U/L — ABNORMAL LOW (ref 11–59)

## 2014-08-02 LAB — PHOSPHORUS: PHOSPHORUS: 3.1 mg/dL (ref 2.3–4.6)

## 2014-08-02 LAB — SEDIMENTATION RATE: Sed Rate: 35 mm/hr — ABNORMAL HIGH (ref 0–22)

## 2014-08-02 MED ORDER — HYDRALAZINE HCL 20 MG/ML IJ SOLN
10.0000 mg | INTRAMUSCULAR | Status: DC | PRN
  Administered 2014-08-03: 10 mg via INTRAVENOUS
  Filled 2014-08-02: qty 1

## 2014-08-02 MED ORDER — LEVOTHYROXINE SODIUM 100 MCG PO TABS
200.0000 ug | ORAL_TABLET | ORAL | Status: DC
  Administered 2014-08-03 – 2014-08-05 (×2): 200 ug via ORAL
  Filled 2014-08-02 (×2): qty 2

## 2014-08-02 MED ORDER — DEXTROSE-NACL 5-0.9 % IV SOLN
INTRAVENOUS | Status: DC
  Administered 2014-08-02: 21:00:00 via INTRAVENOUS

## 2014-08-02 MED ORDER — SODIUM CHLORIDE 0.9 % IV SOLN
INTRAVENOUS | Status: DC
  Administered 2014-08-02 – 2014-08-03 (×2): via INTRAVENOUS

## 2014-08-02 MED ORDER — ONDANSETRON HCL 4 MG/2ML IJ SOLN
4.0000 mg | Freq: Four times a day (QID) | INTRAMUSCULAR | Status: DC | PRN
Start: 2014-08-02 — End: 2014-08-05
  Administered 2014-08-04 (×2): 4 mg via INTRAVENOUS
  Filled 2014-08-02 (×2): qty 2

## 2014-08-02 MED ORDER — ACETAMINOPHEN 325 MG PO TABS: 650.0000 mg | ORAL_TABLET | Freq: Four times a day (QID) | ORAL | Status: DC | PRN

## 2014-08-02 MED ORDER — FERROUS SULFATE 325 (65 FE) MG PO TABS
325.0000 mg | ORAL_TABLET | Freq: Every day | ORAL | Status: DC
Start: 2014-08-03 — End: 2014-08-05
  Administered 2014-08-03 – 2014-08-05 (×3): 325 mg via ORAL
  Filled 2014-08-02 (×4): qty 1

## 2014-08-02 MED ORDER — ONDANSETRON HCL 4 MG PO TABS: 4.0000 mg | ORAL_TABLET | Freq: Four times a day (QID) | ORAL | Status: DC | PRN

## 2014-08-02 MED ORDER — LORATADINE 10 MG PO TABS
10.0000 mg | ORAL_TABLET | Freq: Every day | ORAL | Status: DC
  Administered 2014-08-03 – 2014-08-05 (×3): 10 mg via ORAL
  Filled 2014-08-02 (×3): qty 1

## 2014-08-02 MED ORDER — VANCOMYCIN HCL IN DEXTROSE 1-5 GM/200ML-% IV SOLN
1000.0000 mg | Freq: Two times a day (BID) | INTRAVENOUS | Status: DC
  Administered 2014-08-03 – 2014-08-04 (×5): 1000 mg via INTRAVENOUS
  Filled 2014-08-02 (×6): qty 200

## 2014-08-02 MED ORDER — MAGNESIUM OXIDE 400 (241.3 MG) MG PO TABS
200.0000 mg | ORAL_TABLET | Freq: Four times a day (QID) | ORAL | Status: DC
  Administered 2014-08-02 – 2014-08-05 (×10): 200 mg via ORAL
  Filled 2014-08-02 (×14): qty 0.5

## 2014-08-02 MED ORDER — PIPERACILLIN-TAZOBACTAM 3.375 G IVPB
3.3750 g | Freq: Three times a day (TID) | INTRAVENOUS | Status: DC
  Administered 2014-08-03 – 2014-08-04 (×7): 3.375 g via INTRAVENOUS
  Filled 2014-08-02 (×9): qty 50

## 2014-08-02 MED ORDER — IOHEXOL 350 MG/ML SOLN
100.0000 mL | Freq: Once | INTRAVENOUS | Status: AC | PRN
  Administered 2014-08-02: 100 mL via INTRAVENOUS

## 2014-08-02 MED ORDER — MORPHINE SULFATE 2 MG/ML IJ SOLN
2.0000 mg | INTRAMUSCULAR | Status: DC | PRN
  Administered 2014-08-03 – 2014-08-05 (×11): 2 mg via INTRAVENOUS
  Filled 2014-08-02 (×11): qty 1

## 2014-08-02 MED ORDER — ADULT MULTIVITAMIN W/MINERALS CH
1.0000 | ORAL_TABLET | Freq: Every day | ORAL | Status: DC
  Administered 2014-08-03 – 2014-08-05 (×3): 1 via ORAL
  Filled 2014-08-02 (×3): qty 1

## 2014-08-02 MED ORDER — ONDANSETRON HCL 4 MG/2ML IJ SOLN
4.0000 mg | Freq: Once | INTRAMUSCULAR | Status: AC
  Administered 2014-08-02: 4 mg via INTRAVENOUS
  Filled 2014-08-02: qty 2

## 2014-08-02 MED ORDER — MORPHINE SULFATE 4 MG/ML IJ SOLN
4.0000 mg | Freq: Once | INTRAMUSCULAR | Status: AC
  Administered 2014-08-02: 4 mg via INTRAVENOUS
  Filled 2014-08-02: qty 1

## 2014-08-02 MED ORDER — SODIUM CHLORIDE 0.9 % IV BOLUS (SEPSIS)
500.0000 mL | Freq: Once | INTRAVENOUS | Status: AC
  Administered 2014-08-02: 500 mL via INTRAVENOUS

## 2014-08-02 MED ORDER — POTASSIUM CHLORIDE CRYS ER 20 MEQ PO TBCR
20.0000 meq | EXTENDED_RELEASE_TABLET | Freq: Two times a day (BID) | ORAL | Status: DC
  Administered 2014-08-02 – 2014-08-05 (×6): 20 meq via ORAL
  Filled 2014-08-02 (×7): qty 1

## 2014-08-02 MED ORDER — PANTOPRAZOLE SODIUM 40 MG PO TBEC
40.0000 mg | DELAYED_RELEASE_TABLET | Freq: Every day | ORAL | Status: DC
  Administered 2014-08-03 – 2014-08-05 (×3): 40 mg via ORAL
  Filled 2014-08-02 (×3): qty 1

## 2014-08-02 MED ORDER — LEVOTHYROXINE SODIUM 100 MCG PO TABS
100.0000 ug | ORAL_TABLET | ORAL | Status: DC
  Administered 2014-08-04: 100 ug via ORAL
  Filled 2014-08-02 (×2): qty 1

## 2014-08-02 MED ORDER — PANCRELIPASE (LIP-PROT-AMYL) 12000-38000 UNITS PO CPEP
12000.0000 [IU] | ORAL_CAPSULE | Freq: Three times a day (TID) | ORAL | Status: DC
  Administered 2014-08-03 – 2014-08-05 (×8): 12000 [IU] via ORAL
  Filled 2014-08-02 (×10): qty 1

## 2014-08-02 MED ORDER — MORPHINE SULFATE 4 MG/ML IJ SOLN
4.0000 mg | Freq: Once | INTRAMUSCULAR | Status: AC
Start: 2014-08-02 — End: 2014-08-02
  Administered 2014-08-02: 4 mg via INTRAVENOUS
  Filled 2014-08-02: qty 1

## 2014-08-02 MED ORDER — GLYBURIDE 5 MG PO TABS
5.0000 mg | ORAL_TABLET | Freq: Two times a day (BID) | ORAL | Status: DC
  Administered 2014-08-03: 5 mg via ORAL
  Filled 2014-08-02 (×3): qty 1

## 2014-08-02 MED ORDER — ACETAMINOPHEN 650 MG RE SUPP: 650.0000 mg | Freq: Four times a day (QID) | RECTAL | Status: DC | PRN

## 2014-08-02 MED ORDER — INSULIN ASPART 100 UNIT/ML ~~LOC~~ SOLN
0.0000 [IU] | Freq: Three times a day (TID) | SUBCUTANEOUS | Status: DC
  Administered 2014-08-03 – 2014-08-04 (×3): 2 [IU] via SUBCUTANEOUS
  Administered 2014-08-04: 1 [IU] via SUBCUTANEOUS
  Administered 2014-08-05: 2 [IU] via SUBCUTANEOUS

## 2014-08-02 MED ORDER — ALPRAZOLAM 1 MG PO TABS: 1.0000 mg | ORAL_TABLET | Freq: Every evening | ORAL | Status: DC | PRN

## 2014-08-02 MED ORDER — SODIUM CHLORIDE 0.9 % IV SOLN: INTRAVENOUS | Status: DC

## 2014-08-02 NOTE — H&P (Signed)
[facility] History and Physical  Courtney Stevens YKZ:[id number] DOB: [birth year] DOA: 08/02/2014  Referring physician: ER physician. PCP: [name], MD   Chief Complaint: Abdominal pain.  HPI: Courtney Stevens is a 58 y.o. female with history of malignant neoplasm of the extra hepatic biliary duct status post Whipple's procedure, history of necrotizing granulomas disease of the liver and fever of unknown origin presents to the ER because of increasing abdominal pain. Patient has been experiencing stabbing pain in the right middle back last week which eventually moved to right upper quadrant over the weekend. Patient also has subjective feeling of fever chills. Pain became very excruciating today and patient came to the ER. In the ER patient had CT chest abdomen pelvis done which shows -  Mixed appearance of the abdomen, with reduced size of the right hepatic lobe mass but prominent increase in portacaval necrotic adenopathy causing extrinsic compression of the portal vein, and increase in the size of the nodular implant adjacent to the right hepatic lobe. Increase in gastrohepatic ligament adenopathy. ER physician had discussed with on-call oncologist. Blood cultures were obtained and patient has been admitted for further management. Patient has been workup for fever of unknown origin and has been following up with [facility] infectious disease and rheumatologist. As per patient patient's Q. fever serologies were negative. It was felt that patient may be having Wegener's granulomatosis but as per patient studies were negative. Patient says that her fever has largely improved over the last one month and started having fevers again last 2 days with chills. Patient denies any nausea vomiting diarrhea. Denies any shortness of breath or chest pain. Pain is improving morphine. Patient was admitted in January this year for fever unknown origin and had biopsy done for the liver lesion  twice.    Review of Systems: As presented in the history of presenting illness, rest negative.  Past Medical History  Diagnosis Date  . Cancer     bile duct ca  . GERD (gastroesophageal reflux disease)   . Diabetes mellitus   . Hypertension   . Hypothyroid   . Hyperlipidemia   . Asthma   . Hepatitis    Past Surgical History  Procedure Laterality Date  . Gastric bypass  2004  . Cholecystectomy  1992  . Bile duct resection  2009  . Appendectomy    . Tonsillectomy    . Abdominal hysterectomy  1990    BIL OOPHORECTOMY  . Tubal ligation    . Breast surgery      BIOPSY NEG   Social History:  reports that she quit smoking about 36 years ago. Her smoking use included Cigarettes. She has a 10 pack-year smoking history. She has never used smokeless tobacco. She reports that she drinks alcohol. She reports that she does not use illicit drugs. Where does patient live home. Can patient participate in ADLs? Yes.  Allergies  Allergen Reactions  . Ace Inhibitors Anaphylaxis  . Tilade [Nedocromil] Anaphylaxis  . Reglan [Metoclopramide] Nausea And Vomiting  . Betadine [Povidone Iodine] Rash    iching     Family History:  Family History  Problem Relation Age of Onset  . Colon cancer Mother   . Hypertension Mother   . Glaucoma Mother   . Diabetes Father   . Alzheimer's disease Father   . Heart attack Father   . Nephrolithiasis Father   . Emphysema Father     was a smoker  . Heart disease Father   .  Nephrolithiasis Sister   . Heart attack Brother   . Nephrolithiasis Brother       Prior to Admission medications   Medication Sig Start Date End Date Taking? Authorizing Provider  ALPRAZolam Duanne Moron) 1 MG tablet Take 1 mg by mouth at bedtime as needed for anxiety (can take up to three times daily).   Yes Historical Provider, MD  aspirin 81 MG tablet Take 81 mg by mouth daily.   Yes Historical Provider, MD  calcium citrate-vitamin D (CITRACAL+D) 315-200 MG-UNIT per tablet Take 1  tablet by mouth 3 (three) times daily.   Yes Historical Provider, MD  cetirizine (ZYRTEC) 10 MG tablet Take 10 mg by mouth every morning.     Yes Historical Provider, MD  ferrous sulfate 325 (65 FE) MG tablet Take 325 mg by mouth daily with breakfast.   Yes Historical Provider, MD  furosemide (LASIX) 80 MG tablet Take 40 mg by mouth daily.   Yes Historical Provider, MD  glyBURIDE (DIABETA) 5 MG tablet Take 5 mg by mouth 2 (two) times daily with a meal.   Yes Historical Provider, MD  levothyroxine (SYNTHROID, LEVOTHROID) 200 MCG tablet Take 100-200 mcg by mouth daily before breakfast. Alternate taking 100 mg and 200 mg   Yes Historical Provider, MD  lipase/protease/amylase (CREON-10/PANCREASE) 12000 UNITS CPEP Take 1 capsule by mouth 3 (three) times daily before meals.     Yes Historical Provider, MD  Magnesium 250 MG TABS Take 1 tablet by mouth 4 (four) times daily.    Yes Historical Provider, MD  metFORMIN (GLUCOPHAGE) 500 MG tablet Take 1,000 mg by mouth 2 (two) times daily with a meal.   Yes Historical Provider, MD  Multiple Vitamins-Minerals (MULTIVITAMIN WITH MINERALS) tablet Take 1 tablet by mouth daily.    Yes Historical Provider, MD  omeprazole (PRILOSEC) 40 MG capsule Take 40 mg by mouth 2 (two) times daily.   Yes Historical Provider, MD  potassium chloride SA (K-DUR,KLOR-CON) 20 MEQ tablet Take 20 mEq by mouth 2 (two) times daily.    Yes Historical Provider, MD  Vitamin D, Ergocalciferol, (DRISDOL) 50000 UNITS CAPS capsule Take 50,000 Units by mouth daily.   Yes Historical Provider, MD    Physical Exam: Filed Vitals:   08/02/14 1458 08/02/14 1649 08/02/14 2004  BP: 137/81 149/80 158/78  Pulse: 89 81 91  Temp: 99.1 F (37.3 C) 99.1 F (37.3 C) 99 F (37.2 C)  TempSrc: Oral Oral Oral  Resp: 20 13 16   Weight: 77.111 kg (170 lb)    SpO2: 100% 98% 99%     General:  Well-developed and nourished.  Eyes: Anicteric no pallor.  ENT: No discharge from ears eyes nose mouth.  Neck:  No mass felt.  Cardiovascular: S1-S2 heard.  Respiratory: No rhonchi or crepitations.  Abdomen: Tenderness in the right upper quadrant and flank area. No guarding or rigidity.  Skin: No rash.  Musculoskeletal: No edema.  Psychiatric: Appears normal.  Neurologic: Alert. Oriented to time place and person. Moves all extremities.  Labs on Admission:  Basic Metabolic Panel:  Recent Labs Lab 08/02/14 1649  NA 135*  K 3.6*  CL 91*  CO2 26  GLUCOSE 61*  BUN 13  CREATININE 0.93  CALCIUM 9.3  MG 2.1  PHOS 3.1   Liver Function Tests:  Recent Labs Lab 08/02/14 1649  AST 32  ALT 29  ALKPHOS 201*  BILITOT 0.3  PROT 8.1  ALBUMIN 3.3*    Recent Labs Lab 08/02/14 1649  LIPASE 7*  Recent Labs Lab 08/02/14 1650  AMMONIA 19   CBC:  Recent Labs Lab 08/02/14 1649  WBC 13.9*  NEUTROABS 11.5*  HGB 13.8  HCT 42.6  MCV 87.5  PLT 268   Cardiac Enzymes: No results found for this basename: CKTOTAL, CKMB, CKMBINDEX, TROPONINI,  in the last 168 hours  BNP (last 3 results) No results found for this basename: PROBNP,  in the last 8760 hours CBG: No results found for this basename: GLUCAP,  in the last 168 hours  Radiological Exams on Admission: Ct Angio Chest Pe W/cm &/or Wo Cm  08/02/2014   CLINICAL DATA:  Right upper quadrant pain  EXAM: CT ANGIOGRAPHY CHEST WITH CONTRAST  TECHNIQUE: Multidetector CT imaging of the chest was performed using the standard protocol during bolus administration of intravenous contrast. Multiplanar CT image reconstructions and MIPs were obtained to evaluate the vascular anatomy.  CONTRAST:  179mL OMNIPAQUE IOHEXOL 350 MG/ML SOLN  COMPARISON:  02/01/2014  FINDINGS: There are no filling defect in the pulmonary arterial tree to suggest acute pulmonary thromboembolism.  Three vessel coronary artery calcifications.  Stable 1.3 cm subcarinal lymph node.  No pneumothorax.  No pleural effusion.  Lungs are under aerated with dependent atelectasis  bilaterally.  Review of the MIP images confirms the above findings.  IMPRESSION: No evidence of acute pulmonary thromboembolism.  Stable borderline enlarged subcarinal lymph node.   Electronically Signed   By: Maryclare Bean M.D.   On: 08/02/2014 19:15   Ct Abdomen Pelvis W Contrast  08/02/2014   CLINICAL DATA:  Right upper quadrant abdominal pain. Dyspnea. Cholangiocarcinoma.  EXAM: CT ABDOMEN AND PELVIS WITH CONTRAST  TECHNIQUE: Multidetector CT imaging of the abdomen and pelvis was performed using the standard protocol following bolus administration of intravenous contrast.  CONTRAST:  158mL OMNIPAQUE IOHEXOL 350 MG/ML SOLN  COMPARISON:  04/06/2014  FINDINGS: Lower chest:  See CT chest report  Hepatobiliary: Pneumobilia, as before. Segment 7 mass, 1.8 x 1.9 cm, by my measurements previously 3.6 x 2.9 cm.  Spleen: Intact  Pancreas: Pancreatic tail observed, pancreatic head and body not seen -prior Whipple procedure.  Stomach/Bowel: Postoperative findings in the proximal stomach as part of prior Whipple procedure. Several air- fluid levels and proximal loops of small bowel. Appendix not well seen.  Adrenals/urinary tract: Poor definition of the right adrenal gland due to progressive porta hepatis adenopathy adjacent to the adrenal gland. Bilateral perirenal stranding. Mildly distended urinary bladder. No stones observed. At least partially duplicated right collecting system with some atrophy of the upper pole moiety.  Vascular/Lymphatic: Prominently increased porta hepatis adenopathy. Conglomerate adenopathy between in the cava and portal vein has necrotic center and measures 4.7 by 3.0 cm -formerly there was no measurable node here. Additional scattered gastrohepatic ligament nodes are observed and there is an enlarging tumor deposit along the peritoneal margin adjacent to the right hepatic lobe measuring 0.9 x 1.2 cm. The dominant nodal mass adjacent to the portal vein causes prominent extrinsic narrowing of the  portal vein without complete occlusion. No definite thrombus is observed in the portal vein. Splenic vein patent.  Reproductive: Surgically absent  Musculoskeletal: Intact  Other: Non  IMPRESSION: 1. Mixed appearance of the abdomen, with reduced size of the right hepatic lobe mass but prominent increase in portacaval necrotic adenopathy causing extrinsic compression of the portal vein, and increase in the size of the nodular implant adjacent to the right hepatic lobe. Increase in gastrohepatic ligament adenopathy.   Electronically Signed   By: Franchot Mimes  Janeece Fitting M.D.   On: 08/02/2014 19:22     Assessment/Plan Principal Problem:   Abdominal pain Active Problems:   Hypertension   Hypothyroidism   Hepatic necrotizing granuloma   FUO (fever of unknown origin)   Diabetes mellitus type 2, controlled   1. Abdominal pain with increase in portacaval necrotic adenopathy causing extrinsic compression of portal vein and increase in size of nodular implant adjacent to right hepatic lobe and increase in gastrohepatic ligament adenopathy. Patient at this time is being placed empirically on antibiotics after blood cultures obtained. Check sedimentation rate. Oncology was consulted by the ED physician. We'll need to get infectious disease and gastroenterology input. Continue with gentle hydration and pain relief medications. 2. History of malignant neoplasm of the extrahepatic biliary duct status post Whipple's procedure - further recommendations per gastroenterologist and oncologist. 3. Diabetes mellitus type 2 - patient was initially mildly hypoglycemic breath improved with patient eating. Closely follow CBGs. Hold off metformin for now and patient is on sliding-scale coverage. 4. Hypothyroidism - continue Synthroid. 5. History of hypertension - hold diuretics for now and patient is getting gentle hydration. Closely follow her respiratory status. When necessary IV hydralazine for systolic blood pressure more than  180.    Code Status: Full code.  Family Communication: None.  Disposition Plan: Admit to inpatient.    Marland Reine N. [facility] Pager [phone].  If 7PM-7AM, please contact night-coverage [email or web address] Password [facility] 08/02/2014, 9:46 PM

## 2014-08-02 NOTE — Progress Notes (Signed)
Patient ID: Courtney Stevens, female   DOB: Jan 30, 1956, 57 y.o.   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 Patient presente dto office today c/o 1 week hx/o moid back , rt flank and RUQ Abd pains which she quantified severity as 12 out of 10 max!-  She described the pains as sharp, stabbing and Squeezing with associated nausea w/o emesis.   Brief exam found nl  BP 132/78,   P 84,  Resp 14 & T 97.9 HEENT - neg Neck - supple Chest - clear ABD- tense with moderate generalized diffuse guarding and maximal at RUQ with suspect rebound. BS quiet.  Patient was advised ER evaluation for expediency and referred to ER.

## 2014-08-02 NOTE — Progress Notes (Signed)
ANTIBIOTIC CONSULT NOTE - INITIAL  Pharmacy Consult for Zosyn and Vancomycin Indication:Probable Intra-abdominal infection  Allergies  Allergen Reactions  . Ace Inhibitors Anaphylaxis  . Tilade [Nedocromil] Anaphylaxis  . Reglan [Metoclopramide] Nausea And Vomiting  . Betadine [Povidone Iodine] Rash    iching     Patient Measurements: Height: 5\' 7"  (170.2 cm) Weight: 159 lb 1.6 oz (72.167 kg) IBW/kg (Calculated) : 61.6   Vital Signs: Temp: 97.8 F (36.6 C) (09/15 2234) Temp src: Oral (09/15 2234) BP: 158/86 mmHg (09/15 2240) Pulse Rate: 85 (09/15 2234) Intake/Output from previous day:   Intake/Output from this shift: Total I/O In: -  Out: 150 [Urine:150]  Labs:  Recent Labs  08/02/14 1649  WBC 13.9*  HGB 13.8  PLT 268  CREATININE 0.93   Estimated Creatinine Clearance: 63.3 ml/min (by C-G formula based on Cr of 0.93). No results found for this basename: VANCOTROUGH, VANCOPEAK, VANCORANDOM, GENTTROUGH, GENTPEAK, GENTRANDOM, TOBRATROUGH, TOBRAPEAK, TOBRARND, AMIKACINPEAK, AMIKACINTROU, AMIKACIN,  in the last 72 hours   Microbiology: No results found for this or any previous visit (from the past 720 hour(s)).  Medical History: Past Medical History  Diagnosis Date  . Cancer     bile duct ca  . GERD (gastroesophageal reflux disease)   . Diabetes mellitus   . Hypertension   . Hypothyroid   . Hyperlipidemia   . Asthma   . Hepatitis     Medications:  Scheduled:  . [START ON 08/03/2014] ferrous sulfate  325 mg Oral Q breakfast  . [START ON 08/03/2014] glyBURIDE  5 mg Oral BID WC  . [START ON 08/03/2014] insulin aspart  0-9 Units Subcutaneous TID WC  . [START ON 08/04/2014] levothyroxine  100 mcg Oral Q48H  . [START ON 08/03/2014] levothyroxine  200 mcg Oral Q48H  . [START ON 08/03/2014] lipase/protease/amylase  12,000 Units Oral TID AC  . [START ON 08/03/2014] loratadine  10 mg Oral Daily  . magnesium oxide  200 mg Oral QID  . [START ON 08/03/2014] multivitamin  with minerals  1 tablet Oral Daily  . [START ON 08/03/2014] pantoprazole  40 mg Oral Daily  . piperacillin-tazobactam (ZOSYN)  IV  3.375 g Intravenous Q8H  . potassium chloride SA  20 mEq Oral BID  . vancomycin  1,000 mg Intravenous Q12H   Infusions:  . 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    Goal of Therapy:  Vancomycin trough level 15-20 mcg/ml  Plan:   Zosyn 3.375 Gm IV q8h EI  Vancomycin 1Gm IV q12h  F/U SCr/levels/cultures as needed  Dorrene German 08/02/2014,11:09 PM

## 2014-08-02 NOTE — ED Notes (Signed)
Nurse from 3rd

## 2014-08-02 NOTE — ED Notes (Signed)
Ham sandwich given 

## 2014-08-02 NOTE — ED Notes (Signed)
Pt c/o midback pain radiating to front, and r/lower rib pain "liver area". Pain stated one week ago. Dr. Louanne Belton told pt to report to ED today. Reports nausea, denies vomiting or diahrrea

## 2014-08-02 NOTE — ED Provider Notes (Signed)
CSN: [id number]     Arrival date & time 08/02/14  1437 History   First MD Initiated Contact with Patient 08/02/14 [phone]     Chief Complaint  Patient presents with  . Back Pain    pain in mid back  . Flank Pain    pain in upper r/side x 1 week     (Consider location/radiation/quality/duration/timing/severity/associated sxs/prior Treatment) HPI This patient has been suffering from abdominal pain and right posterior thoracic pain for one weeks duration. The pain is constant and deep and intense in nature. It has been continually worsening over time. It is exacerbated by movements and deep breaths. Last night she reports is quite severe she almost came directly to the emergency department however she was able to take 2 Vicodin and get enough relief to get a little bit of sleep. This morning she went to see her physician and was sent to the emergency department for further evaluation. She has had some nausea but no vomiting and no diarrhea. She reports for her fever is higher than 97. She wears her temperature has been up to 98 which might present a fever. She is in no other associated symptoms. The patient does however have significant past medical history of common bile duct cancer and history of a Whipple procedure.  Past Medical History  Diagnosis Date  . Cancer     bile duct ca  . GERD (gastroesophageal reflux disease)   . Diabetes mellitus   . Hypertension   . Hypothyroid   . Hyperlipidemia   . Asthma   . Hepatitis    Past Surgical History  Procedure Laterality Date  . Gastric bypass  2004  . Cholecystectomy  1992  . Bile duct resection  2009  . Appendectomy    . Tonsillectomy    . Abdominal hysterectomy  1990    BIL OOPHORECTOMY  . Tubal ligation    . Breast surgery      BIOPSY NEG   Family History  Problem Relation Age of Onset  . Colon cancer Mother   . Hypertension Mother   . Glaucoma Mother   . Diabetes Father   . Alzheimer's disease Father   . Heart attack  Father   . Nephrolithiasis Father   . Emphysema Father     was a smoker  . Heart disease Father   . Nephrolithiasis Sister   . Heart attack Brother   . Nephrolithiasis Brother    History  Substance Use Topics  . Smoking status: Former Smoker -- 2.00 packs/day for 5 years    Types: Cigarettes    Quit date: 11/18/1977  . Smokeless tobacco: Never Used  . Alcohol Use: Yes     Comment: 2-3 glasses of wine once per month    OB History   Grav Para Term Preterm Abortions TAB SAB Ect Mult Living                 Review of Systems 10 Systems reviewed and are negative for acute change except as noted in the HPI.    Allergies  Ace inhibitors; Tilade; Reglan; and Betadine  Home Medications   Prior to Admission medications   Medication Sig Start Date End Date Taking? Authorizing Provider  ALPRAZolam Duanne Moron) 1 MG tablet Take 1 mg by mouth at bedtime as needed for anxiety (can take up to three times daily).   Yes Historical Provider, MD  aspirin 81 MG tablet Take 81 mg by mouth daily.   Yes Historical Provider,  MD  calcium citrate-vitamin D (CITRACAL+D) 315-200 MG-UNIT per tablet Take 1 tablet by mouth 3 (three) times daily.   Yes Historical Provider, MD  cetirizine (ZYRTEC) 10 MG tablet Take 10 mg by mouth every morning.     Yes Historical Provider, MD  ferrous sulfate 325 (65 FE) MG tablet Take 325 mg by mouth daily with breakfast.   Yes Historical Provider, MD  furosemide (LASIX) 80 MG tablet Take 40 mg by mouth daily.   Yes Historical Provider, MD  glyBURIDE (DIABETA) 5 MG tablet Take 5 mg by mouth 2 (two) times daily with a meal.   Yes Historical Provider, MD  levothyroxine (SYNTHROID, LEVOTHROID) 200 MCG tablet Take 100-200 mcg by mouth daily before breakfast. Alternate taking 100 mg and 200 mg   Yes Historical Provider, MD  lipase/protease/amylase (CREON-10/PANCREASE) 12000 UNITS CPEP Take 1 capsule by mouth 3 (three) times daily before meals.     Yes Historical Provider, MD   Magnesium 250 MG TABS Take 1 tablet by mouth 4 (four) times daily.    Yes Historical Provider, MD  metFORMIN (GLUCOPHAGE) 500 MG tablet Take 1,000 mg by mouth 2 (two) times daily with a meal.   Yes Historical Provider, MD  Multiple Vitamins-Minerals (MULTIVITAMIN WITH MINERALS) tablet Take 1 tablet by mouth daily.    Yes Historical Provider, MD  omeprazole (PRILOSEC) 40 MG capsule Take 40 mg by mouth 2 (two) times daily.   Yes Historical Provider, MD  potassium chloride SA (K-DUR,KLOR-CON) 20 MEQ tablet Take 20 mEq by mouth 2 (two) times daily.    Yes Historical Provider, MD  Vitamin D, Ergocalciferol, (DRISDOL) 50000 UNITS CAPS capsule Take 50,000 Units by mouth daily.   Yes Historical Provider, MD   BP 158/78  Pulse 91  Temp(Src) 99 F (37.2 C) (Oral)  Resp 16  Wt 170 lb (77.111 kg)  SpO2 99% Physical Exam  Constitutional: She is oriented to person, place, and time. She appears well-developed and well-nourished.  The patient appears to be in a moderate amount of pain. She is well-nourished well-developed and nontoxic.  HENT:  Head: Normocephalic and atraumatic.  Eyes: EOM are normal. Pupils are equal, round, and reactive to light.  Neck: Neck supple.  Cardiovascular: Normal rate, regular rhythm, normal heart sounds and intact distal pulses.   Pulmonary/Chest: Effort normal and breath sounds normal.  Abdominal: Soft. Bowel sounds are normal. She exhibits no distension. There is tenderness.  The patient has moderate to severe tenderness in right upper quadrant and epigastrium. She is not guarding. I can't appreciate specific mass. Bowel sounds are normal.  Musculoskeletal: Normal range of motion. She exhibits no edema.  Neurological: She is alert and oriented to person, place, and time. She has normal strength. Coordination normal. GCS eye subscore is 4. GCS verbal subscore is 5. GCS motor subscore is 6.  Skin: Skin is warm, dry and intact.  Psychiatric: She has a normal mood and  affect.    ED Course  Procedures (including critical care time) Labs Review Labs Reviewed  COMPREHENSIVE METABOLIC PANEL - Abnormal; Notable for the following:    Sodium 135 (*)    Potassium 3.6 (*)    Chloride 91 (*)    Glucose, Bld 61 (*)    Albumin 3.3 (*)    Alkaline Phosphatase 201 (*)    GFR calc non Af Amer 66 (*)    GFR calc Af Amer 76 (*)    Anion gap 18 (*)    All other components within  normal limits  LIPASE, BLOOD - Abnormal; Notable for the following:    Lipase 7 (*)    All other components within normal limits  CBC WITH DIFFERENTIAL - Abnormal; Notable for the following:    WBC 13.9 (*)    Neutrophils Relative % 83 (*)    Lymphocytes Relative 9 (*)    Neutro Abs 11.5 (*)    Monocytes Absolute 1.1 (*)    All other components within normal limits  APTT - Abnormal; Notable for the following:    aPTT 39 (*)    All other components within normal limits  CULTURE, BLOOD (ROUTINE X 2)  CULTURE, BLOOD (ROUTINE X 2)  PROTIME-INR  MAGNESIUM  PHOSPHORUS  URINALYSIS, ROUTINE W REFLEX MICROSCOPIC  AMMONIA  SEDIMENTATION RATE  I-STAT TROPOININ, ED  I-STAT CG4 LACTIC ACID, ED    Imaging Review Ct Angio Chest Pe W/cm &/or Wo Cm  08/02/2014   CLINICAL DATA:  Right upper quadrant pain  EXAM: CT ANGIOGRAPHY CHEST WITH CONTRAST  TECHNIQUE: Multidetector CT imaging of the chest was performed using the standard protocol during bolus administration of intravenous contrast. Multiplanar CT image reconstructions and MIPs were obtained to evaluate the vascular anatomy.  CONTRAST:  176mL OMNIPAQUE IOHEXOL 350 MG/ML SOLN  COMPARISON:  02/01/2014  FINDINGS: There are no filling defect in the pulmonary arterial tree to suggest acute pulmonary thromboembolism.  Three vessel coronary artery calcifications.  Stable 1.3 cm subcarinal lymph node.  No pneumothorax.  No pleural effusion.  Lungs are under aerated with dependent atelectasis bilaterally.  Review of the MIP images confirms the above  findings.  IMPRESSION: No evidence of acute pulmonary thromboembolism.  Stable borderline enlarged subcarinal lymph node.   Electronically Signed   By: Maryclare Bean M.D.   On: 08/02/2014 19:15   Ct Abdomen Pelvis W Contrast  08/02/2014   CLINICAL DATA:  Right upper quadrant abdominal pain. Dyspnea. Cholangiocarcinoma.  EXAM: CT ABDOMEN AND PELVIS WITH CONTRAST  TECHNIQUE: Multidetector CT imaging of the abdomen and pelvis was performed using the standard protocol following bolus administration of intravenous contrast.  CONTRAST:  160mL OMNIPAQUE IOHEXOL 350 MG/ML SOLN  COMPARISON:  04/06/2014  FINDINGS: Lower chest:  See CT chest report  Hepatobiliary: Pneumobilia, as before. Segment 7 mass, 1.8 x 1.9 cm, by my measurements previously 3.6 x 2.9 cm.  Spleen: Intact  Pancreas: Pancreatic tail observed, pancreatic head and body not seen -prior Whipple procedure.  Stomach/Bowel: Postoperative findings in the proximal stomach as part of prior Whipple procedure. Several air- fluid levels and proximal loops of small bowel. Appendix not well seen.  Adrenals/urinary tract: Poor definition of the right adrenal gland due to progressive porta hepatis adenopathy adjacent to the adrenal gland. Bilateral perirenal stranding. Mildly distended urinary bladder. No stones observed. At least partially duplicated right collecting system with some atrophy of the upper pole moiety.  Vascular/Lymphatic: Prominently increased porta hepatis adenopathy. Conglomerate adenopathy between in the cava and portal vein has necrotic center and measures 4.7 by 3.0 cm -formerly there was no measurable node here. Additional scattered gastrohepatic ligament nodes are observed and there is an enlarging tumor deposit along the peritoneal margin adjacent to the right hepatic lobe measuring 0.9 x 1.2 cm. The dominant nodal mass adjacent to the portal vein causes prominent extrinsic narrowing of the portal vein without complete occlusion. No definite thrombus  is observed in the portal vein. Splenic vein patent.  Reproductive: Surgically absent  Musculoskeletal: Intact  Other: Non  IMPRESSION: 1. Mixed appearance  of the abdomen, with reduced size of the right hepatic lobe mass but prominent increase in portacaval necrotic adenopathy causing extrinsic compression of the portal vein, and increase in the size of the nodular implant adjacent to the right hepatic lobe. Increase in gastrohepatic ligament adenopathy.   Electronically Signed   By: Sherryl Barters M.D.   On: 08/02/2014 19:22     EKG Interpretation None     Dr. Lindi Adie of oncology was consulted. We reviewed the patient's case and findings. He reports to admit the patient to medical service today and the patient will be further evaluated by the oncology team tomorrow  MDM   Final diagnoses:  Tumor of liver  Portal vein external compression   This patient presents as outlined above with fairly intense right upper quadrant pain developing over a weeks duration. CT scan does identify compression of the portal vein with surrounding adenopathy and hepatic mass. These findings are consistent with the patient's presentation. At this point time she does not show signs of being acutely septic or exhibiting signs of infectious etiology. She will be admitted for pain control and further workup by oncology.   Charlesetta Shanks, MD 08/02/14 2159

## 2014-08-03 DIAGNOSIS — K769 Liver disease, unspecified: Secondary | ICD-10-CM

## 2014-08-03 DIAGNOSIS — D490 Neoplasm of unspecified behavior of digestive system: Principal | ICD-10-CM

## 2014-08-03 DIAGNOSIS — Z8505 Personal history of malignant neoplasm of liver: Secondary | ICD-10-CM

## 2014-08-03 LAB — BASIC METABOLIC PANEL WITH GFR
Anion gap: 10 (ref 5–15)
BUN: 10 mg/dL (ref 6–23)
CO2: 26 meq/L (ref 19–32)
Calcium: 8.4 mg/dL (ref 8.4–10.5)
Chloride: 97 meq/L (ref 96–112)
Creatinine, Ser: 0.83 mg/dL (ref 0.50–1.10)
GFR calc Af Amer: 88 mL/min — ABNORMAL LOW
GFR calc non Af Amer: 76 mL/min — ABNORMAL LOW
Glucose, Bld: 137 mg/dL — ABNORMAL HIGH (ref 70–99)
Potassium: 4.1 meq/L (ref 3.7–5.3)
Sodium: 133 meq/L — ABNORMAL LOW (ref 137–147)

## 2014-08-03 LAB — HEPATIC FUNCTION PANEL
ALT: 22 U/L (ref 0–35)
AST: 37 U/L (ref 0–37)
Albumin: 2.6 g/dL — ABNORMAL LOW (ref 3.5–5.2)
Alkaline Phosphatase: 160 U/L — ABNORMAL HIGH (ref 39–117)
Bilirubin, Direct: <0.2 mg/dL (ref 0.0–0.3)
Total Bilirubin: 0.4 mg/dL (ref 0.3–1.2)
Total Protein: 6.9 g/dL (ref 6.0–8.3)

## 2014-08-03 LAB — CBC WITH DIFFERENTIAL/PLATELET
Basophils Absolute: 0 K/uL (ref 0.0–0.1)
Basophils Relative: 0 % (ref 0–1)
Eosinophils Absolute: 0.1 K/uL (ref 0.0–0.7)
Eosinophils Relative: 1 % (ref 0–5)
HCT: 36.7 % (ref 36.0–46.0)
Hemoglobin: 11.8 g/dL — ABNORMAL LOW (ref 12.0–15.0)
Lymphocytes Relative: 12 % (ref 12–46)
Lymphs Abs: 1.2 K/uL (ref 0.7–4.0)
MCH: 28.2 pg (ref 26.0–34.0)
MCHC: 32.2 g/dL (ref 30.0–36.0)
MCV: 87.8 fL (ref 78.0–100.0)
Monocytes Absolute: 1.2 K/uL — ABNORMAL HIGH (ref 0.1–1.0)
Monocytes Relative: 12 % (ref 3–12)
Neutro Abs: 7.6 K/uL (ref 1.7–7.7)
Neutrophils Relative %: 75 % (ref 43–77)
Platelets: 284 K/uL (ref 150–400)
RBC: 4.18 MIL/uL (ref 3.87–5.11)
RDW: 14.6 % (ref 11.5–15.5)
WBC: 10.1 K/uL (ref 4.0–10.5)

## 2014-08-03 LAB — GLUCOSE, CAPILLARY
GLUCOSE-CAPILLARY: 152 mg/dL — AB (ref 70–99)
Glucose-Capillary: 94 mg/dL (ref 70–99)
Glucose-Capillary: 143 mg/dL — ABNORMAL HIGH (ref 70–99)

## 2014-08-03 MED ORDER — SODIUM CHLORIDE 0.9 % IV SOLN
INTRAVENOUS | Status: AC
Start: 2014-08-03 — End: 2014-08-04
  Administered 2014-08-04: via INTRAVENOUS

## 2014-08-03 MED ORDER — HYDROCODONE-ACETAMINOPHEN 5-325 MG PO TABS
1.0000 | ORAL_TABLET | ORAL | Status: DC | PRN
  Administered 2014-08-03 – 2014-08-05 (×5): 1 via ORAL
  Filled 2014-08-03 (×5): qty 1

## 2014-08-03 MED ORDER — FUROSEMIDE 40 MG PO TABS
40.0000 mg | ORAL_TABLET | Freq: Every day | ORAL | Status: DC
  Administered 2014-08-03 – 2014-08-05 (×3): 40 mg via ORAL
  Filled 2014-08-03 (×3): qty 1

## 2014-08-03 NOTE — Consult Note (Signed)
[place] CONSULT NOTE  Patient Care Team: Unk Pinto, MD as PCP - General (Internal Medicine)  CHIEF COMPLAINTS/PURPOSE OF CONSULTATION:  Abdominal pain with H/O cholangiocarcinoma  HISTORY OF PRESENTING ILLNESS:  Courtney Stevens 58 y.o. 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 Birdie Sons and [place] on 02/22/2008. Since then she has been on Creon for stools. She was recently hospitalized in January 2015 for fever night sweats abdominal symptoms and was diagnosed with acute fever and she received doxycycline. In January she is also had liver biopsies that suggested that she has Wegener's granulomatosis. She did not have any clinical evidence of disease relapse (atleast not biopsy proven).   She presented to the hospital with increasing abdominal pain radiating to the back after being seen by her primary care physician. In the emergency room she had a CT of her abdomen that revealed new portacaval adenopathy causing extrinsic compression of the portal vein in addition to an increased size of nodular implant adjacent to the right hepatic lobe there was also increase in gastrohepatic ligament adenopathy. Her pain has been reasonably well managed with morphine and Percocet. She is still unable to get comfortable she has persistent discomfort in the belly radiating to the back. She denies any fevers or chills.  I reviewed her records extensively and collaborated the history with the patient.   MEDICAL HISTORY:  Past Medical History  Diagnosis Date  . Cancer     bile duct ca  . GERD (gastroesophageal reflux disease)   . Diabetes mellitus   . Hypertension   . Hypothyroid   . Hyperlipidemia   . Asthma   . Hepatitis     SURGICAL HISTORY: Past Surgical History  Procedure Laterality Date  . Gastric bypass  2004  . Cholecystectomy  1992  . Bile duct resection  2009  .  Appendectomy    . Tonsillectomy    . Abdominal hysterectomy  1990    BIL OOPHORECTOMY  . Tubal ligation    . Breast surgery      BIOPSY NEG    SOCIAL HISTORY: History   Social History  . Marital Status: Married    Spouse Name: N/A    Number of Children: 2  . Years of Education: N/A   Occupational History  . HR Manager    Social History Main Topics  . Smoking status: Former Smoker -- 2.00 packs/day for 5 years    Types: Cigarettes    Quit date: 11/18/1977  . Smokeless tobacco: Never Used  . Alcohol Use: Yes     Comment: 2-3 glasses of wine once per month   . Drug Use: No  . Sexual Activity: No   Other Topics Concern  . Not on file   Social History Narrative  . No narrative on file    FAMILY HISTORY: Family History  Problem Relation Age of Onset  . Colon cancer Mother   . Hypertension Mother   . Glaucoma Mother   . Diabetes Father   . Alzheimer's disease Father   . Heart attack Father   . Nephrolithiasis Father   . Emphysema Father     was a smoker  . Heart disease Father   . Nephrolithiasis Sister   . Heart attack Brother   . Nephrolithiasis Brother     ALLERGIES:  is allergic to ace inhibitors; tilade; reglan; and betadine.  MEDICATIONS:  Current  Facility-Administered Medications  Medication Dose Route Frequency Provider Last Rate Last Dose  . 0.9 %  sodium chloride infusion   Intravenous Continuous Robbie Lis, MD 10 mL/hr at 08/03/14 1515    . acetaminophen (TYLENOL) tablet 650 mg  650 mg Oral Q6H PRN Rise Patience, MD      . ALPRAZolam Duanne Moron) tablet 1 mg  1 mg Oral QHS PRN Rise Patience, MD      . ferrous sulfate tablet 325 mg  325 mg Oral Q breakfast Rise Patience, MD   325 mg at 08/03/14 6283  . furosemide (LASIX) tablet 40 mg  40 mg Oral Daily Robbie Lis, MD      . hydrALAZINE (APRESOLINE) injection 10 mg  10 mg Intravenous Q4H PRN Rise Patience, MD      . HYDROcodone-acetaminophen (NORCO/VICODIN) 5-325 MG per  tablet 1 tablet  1 tablet Oral Q4H PRN Robbie Lis, MD   1 tablet at 08/03/14 1555  . insulin aspart (novoLOG) injection 0-9 Units  0-9 Units Subcutaneous TID WC Rise Patience, MD      . Derrill Memo ON 08/04/2014] levothyroxine (SYNTHROID, LEVOTHROID) tablet 100 mcg  100 mcg Oral Q48H Rise Patience, MD      . levothyroxine (SYNTHROID, LEVOTHROID) tablet 200 mcg  200 mcg Oral Q48H Rise Patience, MD   200 mcg at 08/03/14 [phone]  . lipase/protease/amylase (CREON) capsule 12,000 Units  12,000 Units Oral TID AC Rise Patience, MD   12,000 Units at 08/03/14 1323  . loratadine (CLARITIN) tablet 10 mg  10 mg Oral Daily Rise Patience, MD   10 mg at 08/03/14 0930  . magnesium oxide (MAG-OX) tablet 200 mg  200 mg Oral QID Rise Patience, MD   200 mg at 08/03/14 1555  . morphine 2 MG/ML injection 2 mg  2 mg Intravenous Q3H PRN Rise Patience, MD   2 mg at 08/03/14 1309  . multivitamin with minerals tablet 1 tablet  1 tablet Oral Daily Rise Patience, MD   1 tablet at 08/03/14 0930  . ondansetron (ZOFRAN) tablet 4 mg  4 mg Oral Q6H PRN Rise Patience, MD       Or  . ondansetron [facility]) injection 4 mg  4 mg Intravenous Q6H PRN Rise Patience, MD      . pantoprazole (PROTONIX) EC tablet 40 mg  40 mg Oral Daily Rise Patience, MD   40 mg at 08/03/14 0930  . piperacillin-tazobactam (ZOSYN) IVPB 3.375 g  3.375 g Intravenous Q8H Dorrene German, RPH   3.375 g at 08/03/14 1555  . potassium chloride SA (K-DUR,KLOR-CON) CR tablet 20 mEq  20 mEq Oral BID Rise Patience, MD   20 mEq at 08/03/14 0930  . vancomycin (VANCOCIN) IVPB 1000 mg/200 mL premix  1,000 mg Intravenous Q12H Dorrene German, RPH   1,000 mg at 08/03/14 1323    REVIEW OF SYSTEMS:   Constitutional: Denies fevers, chills or abnormal night sweats Eyes: Denies blurriness of vision, double vision or watery eyes Ears, nose, mouth, throat, and face: Denies mucositis or sore throat Respiratory:  Denies cough, dyspnea or wheezes Cardiovascular: Denies palpitation, chest discomfort or lower extremity swelling Gastrointestinal:  Mild to moderate tenderness in the right upper quadrant Skin: Denies abnormal skin rashes Lymphatics: Denies new lymphadenopathy or easy bruising Neurological:Denies numbness, tingling or new weaknesses Behavioral/Psych: Mood is stable, no new changes   All other systems were reviewed with  the patient and are negative.  PHYSICAL EXAMINATION: ECOG PERFORMANCE STATUS: 2 - Symptomatic, <50% confined to bed  Filed Vitals:   08/03/14 1441  BP: 140/68  Pulse: 65  Temp: 98.6 F (37 C)  Resp: 16   Filed Weights   08/02/14 1458 08/02/14 2234 08/03/14 0445  Weight: 170 lb (77.111 kg) 159 lb 1.6 oz (72.167 kg) 159 lb (72.122 kg)    GENERAL:alert, no distress and comfortable SKIN: skin color, texture, turgor are normal, no rashes or significant lesions EYES: normal, conjunctiva are pink and non-injected, sclera clear OROPHARYNX:no exudate, no erythema and lips, buccal mucosa, and tongue normal  NECK: supple, thyroid normal size, non-tender, without nodularity LYMPH:  no palpable lymphadenopathy in the cervical, axillary or inguinal LUNGS: clear to auscultation and percussion with normal breathing effort HEART: regular rate & rhythm and no murmurs and no lower extremity edema ABDOMEN: Right upper quadrant tenderness and mild tenderness diffusely throughout the rest of the belly Musculoskeletal:no cyanosis of digits and no clubbing  PSYCH: alert & oriented x 3 with fluent speech NEURO: no focal motor/sensory deficits   LABORATORY DATA:  I have reviewed the data as listed Lab Results  Component Value Date   WBC 10.1 08/03/2014   HGB 11.8* 08/03/2014   HCT 36.7 08/03/2014   MCV 87.8 08/03/2014   PLT 284 08/03/2014   Lab Results  Component Value Date   NA 133* 08/03/2014   K 4.1 08/03/2014   CL 97 08/03/2014   CO2 26 08/03/2014    RADIOGRAPHIC  STUDIES: I have personally reviewed the radiological reports and agreed with the findings in the report. CT abdomen 08/02/2014: Mixed appearance of the abdomen reveals right hepatic lobe mass but increase in portacaval necrotic adenopathy causing extrinsic compression of portal vein and increase in size of nodular implant adjacent to the right hepatic lobe, increasing gastrohepatic ligament adenopathy  ASSESSMENT AND PLAN:  1. Adenocarcinoma of the extrahepatic biliary system: Until recently there was no clinical evidence of disease relapse. The current increase in nodularity could be recurrent disease versus infection versus granuloma. I called and discussed the case with interventional radiology (dr.McColllough). He is not on call for [facility] long IR but will communicate with the interventional radiologist to evaluate the scan and determine if the necrotic lymph node can be biopsied. Once we have the biopsy results, Dr. Lona Kettle would be able to discuss the appropriate management. Her most recent CA 19-9 was normal.  2. Abdominal pain: Improved since she has been admitted but continues to have vague discomfort. Her pain medicines are being currently adjusted by the admitting team.  3. patient is currently on antibiotics with Zosyn and vancomycin.   All questions were answered.  I spent 40 minutes counseling the patient face to face. The total time spent in the appointment was 55 minutes and more than 50% was on counseling.     Rulon Eisenmenger, MD @T @ 5:07 PM

## 2014-08-03 NOTE — Progress Notes (Signed)
Inpatient Diabetes Program Recommendations  AACE/ADA: New Consensus Statement on Inpatient Glycemic Control (2013)  Target Ranges:  Prepandial:   less than 140 mg/dL      Peak postprandial:   less than 180 mg/dL (1-2 hours)      Critically ill patients:  140 - 180 mg/dL   Reason for Visit: Hypoglycemia  Diabetes history: DM2 Outpatient Diabetes medications: glyburide 5 mg bid and metformin 1000 mg bid Current orders for Inpatient glycemic control: Novolog sensitive tidwc and glyburide 5 mg bid  Low blood sugar treated (ham sandwich) and cbg check was within one hour of eating. CBG 224. Results for JOSELINNE, LAWAL (MRN [id number]) as of 08/03/2014 12:40  Ref. Range 08/02/2014 19:31 08/02/2014 22:08 08/02/2014 23:56 08/03/2014 07:27 08/03/2014 12:30  Glucose-Capillary Latest Range: 70-99 mg/dL 69 (L) 224 (H) 190 (H) 94 143 (H)   Inpatient Diabetes Program Recommendations Oral Agents: D/C glyburide to prevent hypoglycemia  Note: Will continue to follow. Thank you. Lorenda Peck, RD, LDN, CDE Inpatient Diabetes Coordinator [phone]

## 2014-08-03 NOTE — Progress Notes (Signed)
Patient ID: Courtney Stevens, female   DOB: [birth year], 58 y.o.   MRN: [id number] [facility] PROGRESS NOTE  AICHA CLINGENPEEL GQQ:[id number] DOB: [birth year] DOA: 08/02/2014 PCP: [name], MD  Brief narrative: 58 y.o. female with history of cholngiocarcinoma status post Whipple's procedure, history of necrotizing granulomas disease of the liver, fever of unknown origin and has follow up at [facility] for this who presented to [facility] ED 08/02/2014 with worsening abdominal pan which radiated to the back and right upper abdomen for past 2 days prior to this admission. On admission, pt had CT scan which showed reduced size of the right hepatic lobe mass but prominent increase in portacaval necrotic adenopathy causing extrinsic compression of the portal vein and increase in the size of the nodular implant adjacent to the right hepatic lobe, increase in gastrohepatic ligament adenopathy. Pt was started on broad spectrum antibiotics for possible infectious etiology of abdominal pain and necrotic adenopathy.  Assessment/Plan:    Principal Problem:   Abdominal pain in patient with history of cholangiocarcinoma  CT showed reduced size of the right hepatic lobe mass but prominent increase in portacaval necrotic adenopathy causing extrinsic compression of the portal vein and increase in the size of the nodular implant adjacent to the right hepatic lobe, increase in gastrohepatic ligament adenopathy.  ALP elevated at 160 but the rest of liver enzymes are WNL.  Per GI (phone call only) the findings above possible suggestive of cancer recurrence. Recommended oncology to be consulted, nothing from GI standpoint at this time.  Oncology (Dr. Lona Kettle) notified of patient's admission  Will continue broad spectrum abx for now, vanco and zosyn. Blood cultures to date are negative.  Active Problems:   Hypertension  Restart lasix 40 mg daily PO   Hypothyroidism  Continue levothyroxine    Iron deficiency anemia /  Anemia of chronic disease  Continue ferrous sulfate supplementation    Diabetes mellitus type 2, controlled  A1c in 06/2014 6.8 indicating good glycemic control  Per diabetic coordinator d/c glyburide to prevent hypoglycemia  Only SSi for now   Hypokalemia  Due to laisx  Repleted   DVT Prophylaxis   SCD's bilaterally  Code Status: Full.  Family Communication:  plan of care discussed with the patient Disposition Plan: Home when stable.    IV Access:   Peripheral IV Procedures and diagnostic studies:    Ct Angio Chest Pe W/cm &/or Wo Cm  08/02/2014 No evidence of acute pulmonary thromboembolism.  Stable borderline enlarged subcarinal lymph node.     Ct Abdomen Pelvis W Contrast  08/02/2014 1. Mixed appearance of the abdomen, with reduced size of the right hepatic lobe mass but prominent increase in portacaval necrotic adenopathy causing extrinsic compression of the portal vein, and increase in the size of the nodular implant adjacent to the right hepatic lobe. Increase in gastrohepatic ligament adenopathy.     Medical Consultants:   GI - phone call only Oncology, Dr. Bernadene Bell Other Consultants:   None  Anti-Infectives:   Vanco 08/02/2014 --> Zosyn 08/02/2014 -->   Leisa Lenz, MD  [facility] Pager [phone]  If 7PM-7AM, please contact night-coverage [email or web address] Password [facility] 08/03/2014, 9:51 AM   LOS: 1 day    HPI/Subjective: No acute overnight events.  Objective: Filed Vitals:   08/02/14 2004 08/02/14 2234 08/02/14 2240 08/03/14 0445  BP: 158/78 159/101 158/86 147/78  Pulse: 91 85  78  Temp: 99 F (37.2 C) 97.8 F (36.6 C)  98.7 F (37.1 C)  TempSrc:  Oral Oral  Oral  Resp: 16 18  16   Height:  5\' 7"  (1.702 m)    Weight:  72.167 kg (159 lb 1.6 oz)  72.122 kg (159 lb)  SpO2: 99% 99%  98%    Intake/Output Summary (Last 24 hours) at 08/03/14 0951 Last data filed at 08/03/14 0830  Gross per 24 hour  Intake 1408.9 ml  Output   1350 ml   Net   58.9 ml    Exam:   General:  Pt is alert, follows commands appropriately, not in acute distress  Cardiovascular: Regular rate and rhythm, S1/S2, no murmurs  Respiratory: Clear to auscultation bilaterally, no wheezing, no crackles, no rhonchi  Abdomen: Soft, non tender, non distended, bowel sounds present  Extremities: No edema, pulses DP and PT palpable bilaterally  Neuro: Grossly nonfocal  Data Reviewed: Basic Metabolic Panel:  Recent Labs Lab 08/02/14 1649 08/03/14 0425  NA 135* 133*  K 3.6* 4.1  CL 91* 97  CO2 26 26  GLUCOSE 61* 137*  BUN 13 10  CREATININE 0.93 0.83  CALCIUM 9.3 8.4  MG 2.1  --   PHOS 3.1  --    Liver Function Tests:  Recent Labs Lab 08/02/14 1649 08/03/14 0425  AST 32 37  ALT 29 22  ALKPHOS 201* 160*  BILITOT 0.3 0.4  PROT 8.1 6.9  ALBUMIN 3.3* 2.6*    Recent Labs Lab 08/02/14 1649  LIPASE 7*    Recent Labs Lab 08/02/14 1650  AMMONIA 19   CBC:  Recent Labs Lab 08/02/14 1649 08/03/14 0425  WBC 13.9* 10.1  NEUTROABS 11.5* 7.6  HGB 13.8 11.8*  HCT 42.6 36.7  MCV 87.5 87.8  PLT 268 284   Cardiac Enzymes: No results found for this basename: CKTOTAL, CKMB, CKMBINDEX, TROPONINI,  in the last 168 hours BNP: No components found with this basename: POCBNP,  CBG:  Recent Labs Lab 08/02/14 1931 08/02/14 2208 08/02/14 2356 08/03/14 0727  GLUCAP 69* 224* 190* 94    CULTURE, BLOOD (ROUTINE X 2)     Status: None   Collection Time    08/02/14  4:50 PM      Result Value Ref Range Status   Specimen Description BLOOD RIGHT ANTECUBITAL   Final   Value:        BLOOD CULTURE RECEIVED NO GROWTH TO DATE      Performed at [facility]   Report Status PENDING   Incomplete  CULTURE, BLOOD (ROUTINE X 2)     Status: None   Collection Time    08/02/14  5:46 PM      Result Value Ref Range Status   Specimen Description BLOOD RIGHT HAND   Final   Value:        BLOOD CULTURE RECEIVED NO GROWTH TO DATE       Performed at [facility]   Report Status PENDING   Incomplete     Scheduled Meds: . ferrous sulfate  325 mg Oral Q breakfast  . glyBURIDE  5 mg Oral BID WC  . insulin aspart  0-9 Units Subcutaneous TID WC  . [START ON 08/04/2014] levothyroxine  100 mcg Oral Q48H  . levothyroxine  200 mcg Oral Q48H  . lipase/protease/amylase  12,000 Units Oral TID AC  . loratadine  10 mg Oral Daily  . magnesium oxide  200 mg Oral QID  . multivitamin with minerals  1 tablet Oral Daily  . pantoprazole  40 mg Oral Daily  . piperacillin-tazobactam (  ZOSYN)  IV  3.375 g Intravenous Q8H  . potassium chloride SA  20 mEq Oral BID  . vancomycin  1,000 mg Intravenous Q12H   Continuous Infusions: . sodium chloride 125 mL/hr at 08/03/14 [phone]

## 2014-08-04 LAB — BASIC METABOLIC PANEL
ANION GAP: 11 (ref 5–15)
BUN: 10 mg/dL (ref 6–23)
CHLORIDE: 100 meq/L (ref 96–112)
CO2: 24 mEq/L (ref 19–32)
Calcium: 8.3 mg/dL — ABNORMAL LOW (ref 8.4–10.5)
Creatinine, Ser: 0.92 mg/dL (ref 0.50–1.10)
GFR calc Af Amer: 77 mL/min — ABNORMAL LOW (ref >90)
GFR calc non Af Amer: 67 mL/min — ABNORMAL LOW (ref >90)
Glucose, Bld: 166 mg/dL — ABNORMAL HIGH (ref 70–99)
POTASSIUM: 3.7 meq/L (ref 3.7–5.3)
SODIUM: 135 meq/L — AB (ref 137–147)

## 2014-08-04 LAB — GLUCOSE, CAPILLARY
GLUCOSE-CAPILLARY: 164 mg/dL — AB (ref 70–99)
GLUCOSE-CAPILLARY: 123 mg/dL — AB (ref 70–99)
Glucose-Capillary: 179 mg/dL — ABNORMAL HIGH (ref 70–99)
Glucose-Capillary: 251 mg/dL — ABNORMAL HIGH (ref 70–99)

## 2014-08-04 MED ORDER — ALPRAZOLAM 1 MG PO TABS
1.0000 mg | ORAL_TABLET | Freq: Every day | ORAL | Status: DC
  Administered 2014-08-04: 1 mg via ORAL
  Filled 2014-08-04: qty 2

## 2014-08-04 MED ORDER — SENNA 8.6 MG PO TABS: 1.0000 | ORAL_TABLET | Freq: Every day | ORAL | Status: DC | PRN

## 2014-08-04 NOTE — Progress Notes (Signed)
Patient was seen and examined Subjective: Patient is getting much better. Still requiring occasional IV morphine pain medications in addition to oral hydrocodone. Good bowel movements  Filed Vitals:   08/04/14 0516  BP: 147/80  Pulse: 76  Temp: 98.4 F (36.9 C)  Resp: 16   Abdomen: Improvement in tenderness and pain to deep palpation Extremities no edema Heart S1-S2 is normal Lungs are clear Oral mucosa is moist  Assessment and plan: 1. abdominal pain: Probably related to the necrotic lymph node adjacent to the portal vein. Pain is improved with antibiotic therapy. Recommend continuation of oral antibiotics upon discharge for one to 2 weeks.  2. I discussed with interventional radiology. They are not able to biopsy the lymph node because it is located in an area where the biopsy he would have to go through the diaphragm and the liver. Since the patient's symptoms are getting better, I recommended that she be set up to undergo a repeat CT scan of the abdomen in one month and followup with Dr.Sehbai.  3. I suspect that the periportal lymph node may also be related to granulomatous disease. Please obtain a repeat CT of the abdomen and pelvis with contrast in one month and followup with Dr. Lona Kettle.  Patient is agreeable with the treatment plan. Thank you very much for your care

## 2014-08-04 NOTE — Progress Notes (Signed)
Patient ID: Courtney Stevens, female   DOB: 09/19/1956, 58 y.o.   MRN: [id number] [facility] PROGRESS NOTE  Courtney Stevens QMG:[id number] DOB: 12-05-1955 DOA: 08/02/2014 PCP: [name], MD  Brief narrative: 58 y.o. female with history of cholngiocarcinoma status post Whipple's procedure, history of necrotizing granulomas disease of the liver, fever of unknown origin and has follow up at [facility] for this who presented to [facility] ED 08/02/2014 with worsening abdominal pan which radiated to the back and right upper abdomen for past 2 days prior to this admission. On admission, pt had CT scan which showed reduced size of the right hepatic lobe mass but prominent increase in portacaval necrotic adenopathy causing extrinsic compression of the portal vein and increase in the size of the nodular implant adjacent to the right hepatic lobe, increase in gastrohepatic ligament adenopathy. Pt was started on broad spectrum antibiotics for possible infectious etiology of abdominal pain and necrotic adenopathy.   Assessment/Plan:   Principal Problem:  Abdominal pain in patient with history of cholangiocarcinoma  CT showed reduced size of the right hepatic lobe mass but prominent increase in portacaval necrotic adenopathy causing extrinsic compression of the portal vein and increase in the size of the nodular implant adjacent to the right hepatic lobe, increase in gastrohepatic ligament adenopathy.  Appreciate oncology recommendations. Plan is to biopsy necrotic lymph node. Continue antibiotics for now, vancomycin and Zosyn for possible intra-abdominal infection. Blood cultures to date are negative. ALP elevated at 160 but the rest of liver enzymes are WNL.   Active Problems:  Hypertension  Continue lasix 40 mg daily PO Hypothyroidism  Continue levothyroxine  Iron deficiency anemia / Anemia of chronic disease  Continue ferrous sulfate supplementation  Diabetes mellitus type 2, controlled  A1c in 06/2014  6.8 indicating good glycemic control  Glyburide discontinued to prevent hypoglycemic episodes. Patient is on sliding scale insulin only. Hypokalemia  Due to laisx  Repleted DVT Prophylaxis  SCD's bilaterally   Code Status: Full.  Family Communication: plan of care discussed with the patient  Disposition Plan: Home when stable.    IV Access:   Peripheral IV Procedures and diagnostic studies:   Ct Angio Chest Pe W/cm &/or Wo Cm 08/02/2014 No evidence of acute pulmonary thromboembolism. Stable borderline enlarged subcarinal lymph node.  Ct Abdomen Pelvis W Contrast 08/02/2014 1. Mixed appearance of the abdomen, with reduced size of the right hepatic lobe mass but prominent increase in portacaval necrotic adenopathy causing extrinsic compression of the portal vein, and increase in the size of the nodular implant adjacent to the right hepatic lobe. Increase in gastrohepatic ligament adenopathy.  Medical Consultants:   GI - phone call only  Oncology, Dr. Bernadene Bell Other Consultants:   None  Anti-Infectives:   Vanco 08/02/2014 -->  Zosyn 08/02/2014 -->   Leisa Lenz, MD  [facility] Pager [phone]  If 7PM-7AM, please contact night-coverage [email or web address] Password [facility] 08/04/2014, 11:34 AM   LOS: 2 days    HPI/Subjective: No acute overnight events.  Objective: Filed Vitals:   08/03/14 0445 08/03/14 1441 08/03/14 2151 08/04/14 0516  BP: 147/78 140/68 184/94 147/80  Pulse: 78 65 75 76  Temp: 98.7 F (37.1 C) 98.6 F (37 C) 98.4 F (36.9 C) 98.4 F (36.9 C)  TempSrc: Oral Oral Oral Oral  Resp: 16 16 18 16   Height:      Weight: 72.122 kg (159 lb)   72.893 kg (160 lb 11.2 oz)  SpO2: 98% 100% 100% 98%    Intake/Output  Summary (Last 24 hours) at 08/04/14 1134 Last data filed at 08/04/14 0851  Gross per 24 hour  Intake    440 ml  Output   1800 ml  Net  -1360 ml    Exam:   General:  Pt is alert, no distress  Cardiovascular: Regular rate and rhythm, S1/S2  appreciated   Respiratory: Bilateral air entry, no wheezing  Abdomen: Soft, non tender, non distended, bowel sounds present  Extremities: pulses DP and PT palpable bilaterally  Neuro: No focal neurological deficits  Data Reviewed: Basic Metabolic Panel:  Recent Labs Lab 08/02/14 1649 08/03/14 0425 08/04/14 0429  NA 135* 133* 135*  K 3.6* 4.1 3.7  CL 91* 97 100  CO2 26 26 24   GLUCOSE 61* 137* 166*  BUN 13 10 10   CREATININE 0.93 0.83 0.92  CALCIUM 9.3 8.4 8.3*  MG 2.1  --   --   PHOS 3.1  --   --    Liver Function Tests:  Recent Labs Lab 08/02/14 1649 08/03/14 0425  AST 32 37  ALT 29 22  ALKPHOS 201* 160*  BILITOT 0.3 0.4  PROT 8.1 6.9  ALBUMIN 3.3* 2.6*    Recent Labs Lab 08/02/14 1649  LIPASE 7*    Recent Labs Lab 08/02/14 1650  AMMONIA 19   CBC:  Recent Labs Lab 08/02/14 1649 08/03/14 0425  WBC 13.9* 10.1  NEUTROABS 11.5* 7.6  HGB 13.8 11.8*  HCT 42.6 36.7  MCV 87.5 87.8  PLT 268 284   Cardiac Enzymes: No results found for this basename: CKTOTAL, CKMB, CKMBINDEX, TROPONINI,  in the last 168 hours BNP: No components found with this basename: POCBNP,  CBG:  Recent Labs Lab 08/02/14 2356 08/03/14 0727 08/03/14 1230 08/03/14 1722 08/04/14 0713  GLUCAP 190* 94 143* 152* 164*    Recent Results (from the past 240 hour(s))  CULTURE, BLOOD (ROUTINE X 2)     Status: None   Collection Time    08/02/14  4:50 PM      Result Value Ref Range Status   Specimen Description BLOOD RIGHT ANTECUBITAL   Final   Special Requests BOTTLES DRAWN AEROBIC AND ANAEROBIC 5ML   Final   Culture  Setup Time     Final   Value: 08/02/2014 22:33     Performed at [facility]   Culture     Final   Value:        BLOOD CULTURE RECEIVED NO GROWTH TO DATE CULTURE WILL BE HELD FOR 5 DAYS BEFORE ISSUING A FINAL NEGATIVE REPORT     Performed at [facility]   Report Status PENDING   Incomplete  CULTURE, BLOOD (ROUTINE X 2)     Status: None    Collection Time    08/02/14  5:46 PM      Result Value Ref Range Status   Specimen Description BLOOD RIGHT HAND   Final   Special Requests BOTTLES DRAWN AEROBIC AND ANAEROBIC 4CC   Final   Culture  Setup Time     Final   Value: 08/02/2014 22:33     Performed at [facility]   Culture     Final   Value:        BLOOD CULTURE RECEIVED NO GROWTH TO DATE CULTURE WILL BE HELD FOR 5 DAYS BEFORE ISSUING A FINAL NEGATIVE REPORT     Performed at [facility]   Report Status PENDING   Incomplete     Scheduled Meds: . ALPRAZolam  1 mg Oral QHS  . ferrous sulfate  325 mg Oral Q breakfast  . furosemide  40 mg Oral Daily  . insulin aspart  0-9 Units Subcutaneous TID WC  . levothyroxine  100 mcg Oral Q48H  . levothyroxine  200 mcg Oral Q48H  . lipase/protease/amylase  12,000 Units Oral TID AC  . loratadine  10 mg Oral Daily  . magnesium oxide  200 mg Oral QID  . multivitamin with minerals  1 tablet Oral Daily  . pantoprazole  40 mg Oral Daily  . piperacillin-tazobactam (ZOSYN)  IV  3.375 g Intravenous Q8H  . potassium chloride SA  20 mEq Oral BID  . vancomycin  1,000 mg Intravenous Q12H   Continuous Infusions: . sodium chloride 10 mL/hr at 08/04/14 0009

## 2014-08-05 ENCOUNTER — Telehealth: Payer: Self-pay | Admitting: Hematology

## 2014-08-05 DIAGNOSIS — R1084 Generalized abdominal pain: Secondary | ICD-10-CM

## 2014-08-05 LAB — GLUCOSE, CAPILLARY
GLUCOSE-CAPILLARY: 118 mg/dL — AB (ref 70–99)
Glucose-Capillary: 189 mg/dL — ABNORMAL HIGH (ref 70–99)

## 2014-08-05 MED ORDER — HYDROCODONE-ACETAMINOPHEN 5-325 MG PO TABS: 1.0000 | ORAL_TABLET | ORAL | Status: DC | PRN

## 2014-08-05 MED ORDER — FUROSEMIDE 40 MG PO TABS: 40.0000 mg | ORAL_TABLET | Freq: Every day | ORAL | Status: DC

## 2014-08-05 MED ORDER — CIPROFLOXACIN HCL 500 MG PO TABS: 500.0000 mg | ORAL_TABLET | Freq: Two times a day (BID) | ORAL | Status: DC

## 2014-08-05 NOTE — Discharge Summary (Signed)
Physician Discharge Summary  Courtney Stevens TKZ:[id number] DOB: [birth year] DOA: 08/02/2014  PCP: [name], MD  Admit date: 08/02/2014 Discharge date: 08/05/2014  Recommendations for Outpatient Follow-up:  1. continue Cipro for 10 days on discharge. Please followup in cancer Center per scheduled appointment for repeat CT scan. 2. please followup with primary care physician in one to 2 weeks after discharge to make sure symptoms are controlled. 3. please note we stopped the glyburide. You were taking metformin and your A1c is within normal limits. You may continue taking metformin, no need for glimepiride this time.  Discharge Diagnoses:  Principal Problem:   Abdominal pain Active Problems:   Hypertension   Hypothyroidism   Hepatic necrotizing granuloma   FUO (fever of unknown origin)   Diabetes mellitus type 2, controlled    Discharge Condition: stable   Diet recommendation: as tolerated   History of present illness:  58 y.o. female with history of cholngiocarcinoma status post Whipple's procedure, history of necrotizing granulomas disease of the liver, fever of unknown origin and has follow up at [facility] for this who presented to [facility] ED 08/02/2014 with worsening abdominal pan which radiated to the back and right upper abdomen for past 2 days prior to this admission. On admission, pt had CT scan which showed reduced size of the right hepatic lobe mass but prominent increase in portacaval necrotic adenopathy causing extrinsic compression of the portal vein and increase in the size of the nodular implant adjacent to the right hepatic lobe, increase in gastrohepatic ligament adenopathy. Pt was started on broad spectrum antibiotics for possible infectious etiology of abdominal pain and necrotic adenopathy.   Assessment/Plan:   Principal Problem:  Abdominal pain in patient with history of cholangiocarcinoma  CT showed reduced size of the right hepatic lobe mass but prominent increase  in portacaval necrotic adenopathy causing extrinsic compression of the portal vein and increase in the size of the nodular implant adjacent to the right hepatic lobe, increase in gastrohepatic ligament adenopathy.  Appreciate oncology recommendations. Initial plan was for biopsy of a necrotic lymph node but this was not done because the lymph node was located in the area where biopsy would be risky because it would have to be done through the diaphragm and the liver Patient will be discharged home with ciprofloxacin for 10 days. She will followup in [place] per scheduled appointment. She will have repeat CT scan and were in 2-4 weeks after discharge for reevaluation. Blood cultures to date are negative.  ALP elevated at 160 but the rest of liver enzymes are WNL.  Active Problems:  Hypertension  Continue lasix 40 mg daily PO Hypothyroidism  Continue levothyroxine  Iron deficiency anemia / Anemia of chronic disease  Continue ferrous sulfate supplementation  Diabetes mellitus type 2, controlled  A1c in 06/2014 6.8 indicating good glycemic control  Glyburide discontinued to prevent hypoglycemic episodes. Patient was on sliding scale insulin only while in hospital. Patient will continue metformin on discharge. Hypokalemia  Due to laisx  Repleted DVT Prophylaxis  SCD's bilaterally   Code Status: Full.  Family Communication: plan of care discussed with the patient   IV Access:   Peripheral IV Procedures and diagnostic studies:   Ct Angio Chest Pe W/cm &/or Wo Cm 08/02/2014 No evidence of acute pulmonary thromboembolism. Stable borderline enlarged subcarinal lymph node.  Ct Abdomen Pelvis W Contrast 08/02/2014 1. Mixed appearance of the abdomen, with reduced size of the right hepatic lobe mass but prominent increase in portacaval necrotic adenopathy  causing extrinsic compression of the portal vein, and increase in the size of the nodular implant adjacent to the right hepatic lobe. Increase  in gastrohepatic ligament adenopathy.  Medical Consultants:   GI - phone call only  Oncology, Dr. Bernadene Bell Other Consultants:   None  Anti-Infectives:   Vanco 08/02/2014 --> 08/05/2014 Zosyn 08/02/2014 --> 08/05/2014 Cipro on discharge for 10 days.  SignedLeisa Lenz, MD  [facility] 08/05/2014, 8:32 AM  Pager #: [phone]   Discharge Exam: Filed Vitals:   08/05/14 0458  BP: 157/90  Pulse: 91  Temp: 97.9 F (36.6 C)  Resp: 18   Filed Vitals:   08/04/14 0516 08/04/14 1400 08/04/14 2158 08/05/14 0458  BP: 147/80 142/92 149/92 157/90  Pulse: 76 82 80 91  Temp: 98.4 F (36.9 C) 97.6 F (36.4 C) 97.9 F (36.6 C) 97.9 F (36.6 C)  TempSrc: Oral Oral Oral Oral  Resp: 16 18 16 18   Height:      Weight: 72.893 kg (160 lb 11.2 oz)   72.621 kg (160 lb 1.6 oz)  SpO2: 98% 100% 100% 98%    General: Pt is alert, no acute distress, answers questions appropriately Cardiovascular: Regular rate and rhythm, S1/S2 appreciated Respiratory: Clear to auscultation bilaterally, no rhonchi Abdominal: Soft, slight tenderness in the mid abdomen, non distended, bowel sounds +, no guarding Extremities: no edema, no cyanosis, pulses palpable bilaterally DP and PT Neuro: Grossly nonfocal  Discharge Instructions  Discharge Instructions   Call MD for:  difficulty breathing, headache or visual disturbances    Complete by:  As directed      Call MD for:  persistant dizziness or light-headedness    Complete by:  As directed      Call MD for:  persistant nausea and vomiting    Complete by:  As directed      Call MD for:  severe uncontrolled pain    Complete by:  As directed      Diet - low sodium heart healthy    Complete by:  As directed      Discharge instructions    Complete by:  As directed   1. continue Cipro for 10 days on discharge. Please followup in cancer Center per scheduled appointment for repeat CT scan. 2. please followup with primary care physician in one to 2  weeks after discharge to make sure symptoms are controlled. 3. please note we stopped the glyburide. You were taking metformin and your A1c is within normal limits. You may continue taking metformin, no need for glimepiride this time.     Increase activity slowly    Complete by:  As directed             Medication List    STOP taking these medications       glyBURIDE 5 MG tablet  Commonly known as:  DIABETA      TAKE these medications       ALPRAZolam 1 MG tablet  Commonly known as:  XANAX  Take 1 mg by mouth at bedtime as needed for anxiety (can take up to three times daily).     aspirin 81 MG tablet  Take 81 mg by mouth daily.     calcium citrate-vitamin D 315-200 MG-UNIT per tablet  Commonly known as:  CITRACAL+D  Take 1 tablet by mouth 3 (three) times daily.     cetirizine 10 MG tablet  Commonly known as:  ZYRTEC  Take 10 mg by mouth every morning.  ciprofloxacin 500 MG tablet  Commonly known as:  CIPRO  Take 1 tablet (500 mg total) by mouth 2 (two) times daily.     ferrous sulfate 325 (65 FE) MG tablet  Take 325 mg by mouth daily with breakfast.     furosemide 40 MG tablet  Commonly known as:  LASIX  Take 1 tablet (40 mg total) by mouth daily.     HYDROcodone-acetaminophen 5-325 MG per tablet  Commonly known as:  NORCO/VICODIN  Take 1 tablet by mouth every 4 (four) hours as needed for moderate pain.     levothyroxine 200 MCG tablet  Commonly known as:  SYNTHROID, LEVOTHROID  Take 100-200 mcg by mouth daily before breakfast. Alternate taking 100 mg and 200 mg     lipase/protease/amylase 12000 UNITS Cpep capsule  Commonly known as:  CREON  Take 1 capsule by mouth 3 (three) times daily before meals.     Magnesium 250 MG Tabs  Take 1 tablet by mouth 4 (four) times daily.     metFORMIN 500 MG tablet  Commonly known as:  GLUCOPHAGE  Take 1,000 mg by mouth 2 (two) times daily with a meal.     multivitamin with minerals tablet  Take 1 tablet by mouth  daily.     omeprazole 40 MG capsule  Commonly known as:  PRILOSEC  Take 40 mg by mouth 2 (two) times daily.     potassium chloride SA 20 MEQ tablet  Commonly known as:  K-DUR,KLOR-CON  Take 20 mEq by mouth 2 (two) times daily.     Vitamin D (Ergocalciferol) 50000 UNITS Caps capsule  Commonly known as:  DRISDOL  Take 50,000 Units by mouth daily.           Follow-up Information   Follow up with [facility], MD. Schedule an appointment as soon as possible for a visit in 3 weeks. (Follow up appt after recent hospitalization; needs repeat CT scan)    Specialties:  Hematology, Oncology   Contact information:   [place] [address] [phone]        The results of significant diagnostics from this hospitalization (including imaging, microbiology, ancillary and laboratory) are listed below for reference.    Significant Diagnostic Studies: Ct Angio Chest Pe W/cm &/or Wo Cm  08/02/2014   CLINICAL DATA:  Right upper quadrant pain  EXAM: CT ANGIOGRAPHY CHEST WITH CONTRAST  TECHNIQUE: Multidetector CT imaging of the chest was performed using the standard protocol during bolus administration of intravenous contrast. Multiplanar CT image reconstructions and MIPs were obtained to evaluate the vascular anatomy.  CONTRAST:  147mL OMNIPAQUE IOHEXOL 350 MG/ML SOLN  COMPARISON:  02/01/2014  FINDINGS: There are no filling defect in the pulmonary arterial tree to suggest acute pulmonary thromboembolism.  Three vessel coronary artery calcifications.  Stable 1.3 cm subcarinal lymph node.  No pneumothorax.  No pleural effusion.  Lungs are under aerated with dependent atelectasis bilaterally.  Review of the MIP images confirms the above findings.  IMPRESSION: No evidence of acute pulmonary thromboembolism.  Stable borderline enlarged subcarinal lymph node.   Electronically Signed   By: Maryclare Bean M.D.   On: 08/02/2014 19:15   Ct Abdomen Pelvis W Contrast  08/02/2014   CLINICAL DATA:   Right upper quadrant abdominal pain. Dyspnea. Cholangiocarcinoma.  EXAM: CT ABDOMEN AND PELVIS WITH CONTRAST  TECHNIQUE: Multidetector CT imaging of the abdomen and pelvis was performed using the standard protocol following bolus administration of intravenous contrast.  CONTRAST:  181mL OMNIPAQUE IOHEXOL 350 MG/ML SOLN  COMPARISON:  04/06/2014  FINDINGS: Lower chest:  See CT chest report  Hepatobiliary: Pneumobilia, as before. Segment 7 mass, 1.8 x 1.9 cm, by my measurements previously 3.6 x 2.9 cm.  Spleen: Intact  Pancreas: Pancreatic tail observed, pancreatic head and body not seen -prior Whipple procedure.  Stomach/Bowel: Postoperative findings in the proximal stomach as part of prior Whipple procedure. Several air- fluid levels and proximal loops of small bowel. Appendix not well seen.  Adrenals/urinary tract: Poor definition of the right adrenal gland due to progressive porta hepatis adenopathy adjacent to the adrenal gland. Bilateral perirenal stranding. Mildly distended urinary bladder. No stones observed. At least partially duplicated right collecting system with some atrophy of the upper pole moiety.  Vascular/Lymphatic: Prominently increased porta hepatis adenopathy. Conglomerate adenopathy between in the cava and portal vein has necrotic center and measures 4.7 by 3.0 cm -formerly there was no measurable node here. Additional scattered gastrohepatic ligament nodes are observed and there is an enlarging tumor deposit along the peritoneal margin adjacent to the right hepatic lobe measuring 0.9 x 1.2 cm. The dominant nodal mass adjacent to the portal vein causes prominent extrinsic narrowing of the portal vein without complete occlusion. No definite thrombus is observed in the portal vein. Splenic vein patent.  Reproductive: Surgically absent  Musculoskeletal: Intact  Other: Non  IMPRESSION: 1. Mixed appearance of the abdomen, with reduced size of the right hepatic lobe mass but prominent increase in  portacaval necrotic adenopathy causing extrinsic compression of the portal vein, and increase in the size of the nodular implant adjacent to the right hepatic lobe. Increase in gastrohepatic ligament adenopathy.   Electronically Signed   By: Sherryl Barters M.D.   On: 08/02/2014 19:22    Microbiology: Recent Results (from the past 240 hour(s))  CULTURE, BLOOD (ROUTINE X 2)     Status: None   Collection Time    08/02/14  4:50 PM      Result Value Ref Range Status   Specimen Description BLOOD RIGHT ANTECUBITAL   Final   Special Requests BOTTLES DRAWN AEROBIC AND ANAEROBIC 5ML   Final   Culture  Setup Time     Final   Value: 08/02/2014 22:33     Performed at [facility]   Culture     Final   Value:        BLOOD CULTURE RECEIVED NO GROWTH TO DATE CULTURE WILL BE HELD FOR 5 DAYS BEFORE ISSUING A FINAL NEGATIVE REPORT     Performed at [facility]   Report Status PENDING   Incomplete  CULTURE, BLOOD (ROUTINE X 2)     Status: None   Collection Time    08/02/14  5:46 PM      Result Value Ref Range Status   Specimen Description BLOOD RIGHT HAND   Final   Special Requests BOTTLES DRAWN AEROBIC AND ANAEROBIC 4CC   Final   Culture  Setup Time     Final   Value: 08/02/2014 22:33     Performed at [facility]   Culture     Final   Value:        BLOOD CULTURE RECEIVED NO GROWTH TO DATE CULTURE WILL BE HELD FOR 5 DAYS BEFORE ISSUING A FINAL NEGATIVE REPORT     Performed at [facility]   Report Status PENDING   Incomplete     Labs: Basic Metabolic Panel:  Recent Labs Lab 08/02/14 1649 08/03/14 0425  08/04/14 0429  NA 135* 133* 135*  K 3.6* 4.1 3.7  CL 91* 97 100  CO2 26 26 24   GLUCOSE 61* 137* 166*  BUN 13 10 10   CREATININE 0.93 0.83 0.92  CALCIUM 9.3 8.4 8.3*  MG 2.1  --   --   PHOS 3.1  --   --    Liver Function Tests:  Recent Labs Lab 08/02/14 1649 08/03/14 0425  AST 32 37  ALT 29 22  ALKPHOS 201* 160*  BILITOT 0.3 0.4  PROT 8.1 6.9   ALBUMIN 3.3* 2.6*    Recent Labs Lab 08/02/14 1649  LIPASE 7*    Recent Labs Lab 08/02/14 1650  AMMONIA 19   CBC:  Recent Labs Lab 08/02/14 1649 08/03/14 0425  WBC 13.9* 10.1  NEUTROABS 11.5* 7.6  HGB 13.8 11.8*  HCT 42.6 36.7  MCV 87.5 87.8  PLT 268 284   Cardiac Enzymes: No results found for this basename: CKTOTAL, CKMB, CKMBINDEX, TROPONINI,  in the last 168 hours BNP: BNP (last 3 results) No results found for this basename: PROBNP,  in the last 8760 hours CBG:  Recent Labs Lab 08/04/14 0713 08/04/14 1205 08/04/14 1738 08/04/14 2142 08/05/14 0734  GLUCAP 164* 123* 179* 251* 118*    Time coordinating discharge: Over 30 minutes

## 2014-08-05 NOTE — Progress Notes (Signed)
Discharge instructions reviewed with patient using teach back method and she demonstrated understanding.  Patient stable for discharge home.  Assessment unchanged from this am.  Patient's pain controlled at time of discharge.  Patient requesting work note and also wanted to find out if Dr. Charlies Silvers had contacted Dr. Karilyn Cota at [facility].  Dr. Charlies Silvers notified of above.  Work note placed by Dr. Charlies Silvers and printed out for patient.  Dr. Charlies Silvers will attempt to call Dr. Karilyn Cota.  Zandra Abts [facility]  08/05/2014  1:25 PM

## 2014-08-05 NOTE — Discharge Instructions (Signed)

## 2014-08-05 NOTE — Telephone Encounter (Signed)
s.w. pt and advised on OCT appt.Marland KitchenMarland KitchenMarland KitchenDr Charlies Silvers called and wanted pt to have 2wk appt...done...pt ok and aware

## 2014-08-08 LAB — CULTURE, BLOOD (ROUTINE X 2)
CULTURE: NO GROWTH
Culture: NO GROWTH

## 2014-08-09 ENCOUNTER — Encounter: Payer: Self-pay | Admitting: Internal Medicine

## 2014-08-09 ENCOUNTER — Ambulatory Visit (INDEPENDENT_AMBULATORY_CARE_PROVIDER_SITE_OTHER): Payer: BC Managed Care – PPO | Admitting: Internal Medicine

## 2014-08-09 VITALS — BP 166/104 | HR 92 | Temp 98.6°F | Resp 16 | Ht 67.0 in | Wt 177.0 lb

## 2014-08-09 DIAGNOSIS — G893 Neoplasm related pain (acute) (chronic): Secondary | ICD-10-CM

## 2014-08-09 MED ORDER — METHADONE HCL 5 MG PO TABS: ORAL_TABLET | ORAL | Status: DC

## 2014-08-09 NOTE — Progress Notes (Signed)
Subjective:    Patient ID: Courtney Stevens, female    DOB: Aug 19, 1956, 58 y.o.   MRN: [id number]  HPI Pastient has a complicated hx/o Bile Duct Ca- s/p Whipple's - in the last several months evaluated for FOU attributed most recently to Liver granuloma,then  hospitalized Sept 15-18 for severe back pain with CT scan showing decreasing size of hepatic mass ? granuloma and increasing portocaval and gastrohepatic adenopathy. Patient was tx'd with 3 days of IV Zosin & Vanc for speculated possible infection causing LN necrosis and then patient was discharged on po Cipro and Hydrocodone for pain. Patient present now having depleted her pain meds with increasing Left flank and back pain. No assoc N/V and taking diet w/o problems.    Medication List   ALPRAZolam 1 MG tablet  Commonly known as:  XANAX  Take 1 mg by mouth at bedtime as needed for anxiety (can take up to three times daily).     aspirin 81 MG tablet  Take 81 mg by mouth daily.     calcium citrate-vitamin D 315-200 MG-UNIT per tablet  Commonly known as:  CITRACAL+D  Take 1 tablet by mouth 3 (three) times daily.     cetirizine 10 MG tablet  Commonly known as:  ZYRTEC  Take 10 mg by mouth every morning.     ciprofloxacin 500 MG tablet  Commonly known as:  CIPRO  Take 1 tablet (500 mg total) by mouth 2 (two) times daily.     ferrous sulfate 325 (65 FE) MG tablet  Take 325 mg by mouth daily with breakfast.     furosemide 40 MG tablet  Commonly known as:  LASIX  Take 1 tablet (40 mg total) by mouth daily.     HYDROcodone-acetaminophen 5-325 MG per tablet  Commonly known as:  NORCO/VICODIN  Take 1 tablet by mouth every 4 (four) hours as needed for moderate pain.     levothyroxine 200 MCG tablet  Commonly known as:  SYNTHROID, LEVOTHROID  Take 100-200 mcg by mouth daily before breakfast. Alternate taking 100 mg and 200 mg     lipase/protease/amylase 12000 UNITS Cpep capsule  Commonly known as:  CREON  Take 1 capsule by mouth 3  (three) times daily before meals.     Magnesium 250 MG Tabs  Take 1 tablet by mouth 4 (four) times daily.     metFORMIN 500 MG tablet  Commonly known as:  GLUCOPHAGE  Take 1,000 mg by mouth 2 (two) times daily with a meal.     methadone 5 MG tablet  Commonly known as:  DOLOPHINE  Take 1 to 2 tablets 3 x day if needed for severe pain     multivitamin with minerals tablet  Take 1 tablet by mouth daily.     omeprazole 40 MG capsule  Commonly known as:  PRILOSEC  Take 40 mg by mouth 2 (two) times daily.     potassium chloride SA 20 MEQ tablet  Commonly known as:  K-DUR,KLOR-CON  Take 20 mEq by mouth 2 (two) times daily.     Vitamin D (Ergocalciferol) 50000 UNITS Caps capsule  Commonly known as:  DRISDOL  Take 50,000 Units by mouth daily.     Allergies  Allergen Reactions  . Ace Inhibitors Anaphylaxis  . Tilade [Nedocromil] Anaphylaxis  . Reglan [Metoclopramide] Nausea And Vomiting  . Betadine [Povidone Iodine] Rash    iching    Past Medical History  Diagnosis Date  . Cancer  bile duct ca  . GERD (gastroesophageal reflux disease)   . Diabetes mellitus   . Hypertension   . Hypothyroid   . Hyperlipidemia   . Asthma   . Hepatitis    Review of Systems Neg except as above   Objective:   Physical Exam BP 166/104  Pulse 92  Temp 98.6 F   Resp 16  Ht 5\' 7"   Wt 177 lb   BMI 27.72   HEENT - Eac's patent. TM's Nl. EOM's full. PERRLA. NasoOroPharynx clear. Neck - supple. Nl Thyroid. Carotids 2+ & No bruits, nodes, JVD Chest - Clear equal BS w/o Rales, rhonchi, wheezes. Cor - Nl HS. RRR w/o sig MGR. PP 1(+). No edema. Abd - Soft . No palpable organomegaly, masses or tenderness. BS nl. MS- FROM w/o deformities.  (+) tender left paralumbar spasm. Muscle power, tone and bulk Nl. Gait Nl. Neuro - No obvious Cr N abnormalities. Sensory, motor and Cerebellar functions appear Nl w/o focal abnormalities. Psyche - Mental status normal & appropriate.  No delusions,  ideations or obvious mood abnormalities.  Assessment & Plan:   1. Cancer associated pain  Rx- Methodone for pain control as patient has up coming appointment with her new oncologist - Dr Lona Kettle.  PET scan may be helpful in distinguishing necrotic LNs vs Metastatic Dz

## 2014-08-09 NOTE — Patient Instructions (Signed)
Pain of Unknown Etiology (Pain Without a Known Cause) You have come to your caregiver because of pain. Pain can occur in any part of the body. Often there is not a definite cause. If your laboratory (blood or urine) work was normal and X-rays or other studies were normal, your caregiver may treat you without knowing the cause of the pain. An example of this is the headache. Most headaches are diagnosed by taking a history. This means your caregiver asks you questions about your headaches. Your caregiver determines a treatment based on your answers. Usually testing done for headaches is normal. Often testing is not done unless there is no response to medications. Regardless of where your pain is located today, you can be given medications to make you comfortable. If no physical cause of pain can be found, most cases of pain will gradually leave as suddenly as they came.  If you have a painful condition and no reason can be found for the pain, it is important that you follow up with your caregiver. If the pain becomes worse or does not go away, it may be necessary to repeat tests and look further for a possible cause.  Only take over-the-counter or prescription medicines for pain, discomfort, or fever as directed by your caregiver.  For the protection of your privacy, test results cannot be given over the phone. Make sure you receive the results of your test. Ask how these results are to be obtained if you have not been informed. It is your responsibility to obtain your test results.  You may continue all activities unless the activities cause more pain. When the pain lessens, it is important to gradually resume normal activities. Resume activities by beginning slowly and gradually increasing the intensity and duration of the activities or exercise. During periods of severe pain, bed rest may be helpful. Lie or sit in any position that is comfortable.  Ice used for acute (sudden) conditions may be effective.  Use a large plastic bag filled with ice and wrapped in a towel. This may provide pain relief.  See your caregiver for continued problems. Your caregiver can help or refer you for exercises or physical therapy if necessary. If you were given medications for your condition, do not drive, operate machinery or power tools, or sign legal documents for 24 hours. Do not drink alcohol, take sleeping pills, or take other medications that may interfere with treatment. See your caregiver immediately if you have pain that is becoming worse and not relieved by medications.

## 2014-08-13 ENCOUNTER — Encounter: Payer: Self-pay | Admitting: Gastroenterology

## 2014-08-17 ENCOUNTER — Telehealth: Payer: Self-pay | Admitting: Hematology

## 2014-08-17 ENCOUNTER — Telehealth: Payer: Self-pay

## 2014-08-17 NOTE — Telephone Encounter (Signed)
Returning pt call asking about if she needs CT scan before her OV. lvm that Dr Lona Kettle will see the pt and then order CT if needed.

## 2014-08-17 NOTE — Telephone Encounter (Signed)
due to CP1 out moved 10/2 appt to 10/9. s/w pt re change and new appt for 10/9.

## 2014-08-19 ENCOUNTER — Ambulatory Visit: Payer: BC Managed Care – PPO

## 2014-08-26 ENCOUNTER — Encounter: Payer: Self-pay | Admitting: Hematology

## 2014-08-26 ENCOUNTER — Other Ambulatory Visit: Payer: Self-pay | Admitting: *Deleted

## 2014-08-26 ENCOUNTER — Ambulatory Visit (HOSPITAL_BASED_OUTPATIENT_CLINIC_OR_DEPARTMENT_OTHER): Payer: BC Managed Care – PPO | Admitting: Hematology

## 2014-08-26 ENCOUNTER — Telehealth: Payer: Self-pay | Admitting: Hematology

## 2014-08-26 VITALS — BP 151/86 | HR 63 | Temp 98.7°F | Resp 18 | Ht 67.0 in | Wt 170.8 lb

## 2014-08-26 DIAGNOSIS — Z8505 Personal history of malignant neoplasm of liver: Secondary | ICD-10-CM

## 2014-08-26 DIAGNOSIS — R109 Unspecified abdominal pain: Secondary | ICD-10-CM

## 2014-08-26 DIAGNOSIS — C221 Intrahepatic bile duct carcinoma: Secondary | ICD-10-CM

## 2014-08-26 DIAGNOSIS — R918 Other nonspecific abnormal finding of lung field: Secondary | ICD-10-CM

## 2014-08-26 MED ORDER — HYDROCODONE-ACETAMINOPHEN 5-325 MG PO TABS: 1.0000 | ORAL_TABLET | ORAL | Status: DC | PRN

## 2014-08-26 NOTE — Telephone Encounter (Signed)
Pt confirmed labs/ov per 10/09 POF, gave pt AVS..Marland KitchenMarland Kitchen

## 2014-08-26 NOTE — Progress Notes (Signed)
[facility] PROGRESS NOTE  Date of visist: 08/26/2014  [name], MD [address] [place] [address]  DIAGNOSIS: Malignant Neoplasm of extrahepatic Bile ducts.  Chief Complaint  Patient presents with  . Follow-up    CURRENT THERAPY:  Surveillance  PATIENT IDENTIFICATION:  Courtney Stevens 58 y.o. 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 She was last seen by me on Dr Juliann Mule on 07/25/2014. She was hospitalized from 12/03/2013 through 12/15/2013 due to fever, night sweats and abdominal symptoms. Her MRI of abdomen indicated a new peripherally enhancing mass posteromedially within the right hepatic lobe, highly worrisome for local recurrence or metastatic disease from the patient's bile duct adenocarcinoma. She had 2 biopsies which were negative for malignancy.  Her second liver biopsy (12/04/2013) demonstrated acute and chronic granulomatous inflammation with associated granulomata and  she was found to have antibody to Q fever.  She was discharged to complete a one month course of doxycycline per infectious disease (Dr. Linus Salmons).    She underwent a pancreaticoduodenectomy (Whipple) by Dr. Birdie Sons at [facility] center on 02/22/2008.  She denies any additional recent emergency room visits or hospitalizations.  She reports good energy and appetite.  She saw her PCP Dr. Unk Pinto this past May.  She continues creon 3 tablets with meals.  Her weight has been stable.  She reports her last colonoscopy was less than one year ago with one tubulus villous polyp removed and she has to follow up for repeat colonoscopy in 3 years.   Today, she feels well, no nausea, vomiting, jaundice, abdominal pain or weight loss. She denies recurrent fevers or chills.  She did have a spider bite back in May 2015 which  required antibiotics.  It bit her on her left arm.  She was referred to an infectious disease specialist from [place] for further evaluation of her possible Q fever.  She reports that they were concern for idiopathic granulomata hepatitis and that given repeat Q fever testing that was negative, it was likely a false positive.  She was also seen by Dr. Deatra Ina who ordered a CT of her abdomen which demonstrated the liver lesions had decreased in size.     She had a follow up with Dr Johnette Abraham. Clovis Cao, MD Chief of Rheumatology and Immunology at [facility] on 07/06/2014 and was found to have low potassium 2.9 and was told to take 60 meq KCL. All her other labs and x-ray studies were either negative or normal. The c-ANCA was tested negative at [facility] lab and Dr Francella Solian. Wilfred Curtis was not worried about possibility of polyangiitis with granulomatosis. There was no signs of active vasculitis.  Tests which were done and she brought a copy for me included Urine random protein <12 mg/dl, yrine random creatinine <200 mg/dl, UA showing no protein, CXR (07/06/2014) showing no acute cardiopulmonary disease, a CBC which was normal, BMP showing low K+ 2.9 but normal electrolytes and creatinine at 1.0 mg, CRP 0.17 mg normal, c-ANCA negative, C3 129, C4 27. They recommended a 3 month follow up.  INTERVAL HISTORY:  Patient was hospitalized at [facility] on 08/02/2014 with complaints of abdominal pain. In the ER she had a CAT scan of the chest abdomen and pelvis performed showing new portacaval adenopathy causing extrinsic compression of the portal vein in addition to an increase in size of  nodular implant adjacent to the right hepatic lobe. There was also an increase in the gastrohepatic ligament adenopathy. Her case was discussed with the interventional radiology and they were not able to biopsy the lymph node because it is located in an area the biopsy would have to go through the last time and the liver. Therefore, a surveillance strategy  was recommended that is to do another scan in a month. She was discharged on ciprofloxacin for 10 days and there was an improvement in her abdominal pain. 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 I reviewed the scans today.  I am ordering for patient to have repeat labs that his liver function tests and tumor markers CEA and CA 19-9 to be done and I am also ordering a PET CT scan for better evaluation of this adenopathy in the background of diagnosis of cholangiocarcinoma.  She did complain of having some left ear discomfort and I examined her ears bilaterally there is no sign of infection or fluid in the inner ear canal. Tympanic membrane looks fine. She does have some allergies bothering her also for last 3 days.   MEDICAL HISTORY: Past Medical History  Diagnosis Date  . Cancer     bile duct ca  . GERD (gastroesophageal reflux disease)   . Diabetes mellitus   . Hypertension   . Hypothyroid   . Hyperlipidemia   . Asthma   . 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    ALLERGIES:  is allergic to ace inhibitors; tilade; reglan; and betadine.  MEDICATIONS: has a current medication list which includes the following prescription(s): alprazolam, aspirin, calcium citrate-vitamin d, cetirizine, ciprofloxacin, ferrous sulfate,  furosemide, levothyroxine, lipase/protease/amylase, magnesium, metformin, methadone, multivitamin with minerals, omeprazole, potassium chloride sa, vitamin d (ergocalciferol), and hydrocodone-acetaminophen.  SURGICAL HISTORY:  Past Surgical History  Procedure Laterality Date  . Gastric bypass  2004  . Cholecystectomy  1992  . Bile duct resection  2009  . Appendectomy    . Tonsillectomy    . Abdominal hysterectomy  1990    BIL OOPHORECTOMY  . Tubal ligation    . Breast surgery      BIOPSY NEG   PROBLEM LIST:  1. 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 The patient underwent a pancreaticoduodenectomy by Dr. Birdie Sons at [facility] on 02/22/2008. There was extensive involvement of peri-ductal soft tissue and extension to the duodenal wall and pancreas. Lymphovascular and perineural invasion was present. The posterior soft tissue margin was involved with tumor. Four out of 18 regional lymph nodes were involved by tumor. Tumor stage was pT4 pN1 pM0. Following surgery, the patient underwent radiation treatments in conjunction with continuous infusion 5 fluorouracil here in [place] from 04/01/2008 through 05/19/2008. She then received gemcitabine from 05/19/2008 through 12/02/2007. Because of a marked increase in CA19-9, the patient was felt to have a recurrence. This was not confirmed histologically and I believe imaging studies were also non-definitive. The  patient received gemcitabine from 02/17/2009 through 05/25/2009. She then received Taxotere for 3 cycles from 06/15/2009 through 07/27/2009. The patient remains clinically disease-free on no treatment.  2. Elevated tumor markers. CEA and CA19-9 have been elevated with fluctuating levels.  3. Bilateral pulmonary nodules seen on CT scans from 02/19/2013.  4. History of malabsorption detected in early 2011, currently under  successful  treatment.  5. Positive hepatitis B surface antibody and hepatitis B core  antibody.  6. Diabetes mellitus.  7. Hypertension.  8. Hypothyroidism diagnosed April 2011.  9. History of gastric bypass for obesity, Roux-en-Y, in 2004.  10.Diverticulosis.  11.Systolic ejection murmur. 12. Some kind of vasculitis (Polyangiitis or Wegener's granulomatosis) recent serologies negative.  REVIEW OF SYSTEMS:   Constitutional: Denies fevers, chills or abnormal weight loss Eyes: Denies blurriness of vision Ears, nose, mouth, throat, and face: Denies mucositis or sore throat Respiratory: Denies cough, dyspnea or wheezes Cardiovascular: Denies palpitation, chest discomfort or lower extremity swelling Gastrointestinal:  Denies nausea, heartburn or change in bowel habits Skin: Denies abnormal skin rashes Lymphatics: Denies new lymphadenopathy or easy bruising Neurological:Denies numbness, tingling or new weaknesses Behavioral/Psych: Mood is stable, no new changes  All other systems were reviewed with the patient and are negative.  PHYSICAL EXAMINATION: ECOG PERFORMANCE STATUS: 0  Blood pressure 151/86, pulse 63, temperature 98.7 F (37.1 C), temperature source Oral, resp. rate 18, height 5\' 7"  (1.702 m), weight 170 lb 12.8 oz (77.474 kg), SpO2 99.00%.  GENERAL:alert, no distress and comfortable SKIN: skin color, texture, turgor are normal, no rashes or significant lesions EYES: normal, Conjunctiva are pink and non-injected, sclera clear OROPHARYNX:no exudate, no erythema and lips, buccal mucosa, and tongue normal  NECK: supple, thyroid normal size, non-tender, without nodularity LYMPH:  no palpable lymphadenopathy in the cervical, axillary or supraclavicular LUNGS: clear to auscultation and percussion with normal breathing effort HEART: regular rate & rhythm and SEM II/VI and no lower extremity  ABDOMEN:abdomen soft, non-tender and normal bowel sounds, post surgical scars well healed. No  organomegaly appreciated.  Musculoskeletal:no cyanosis of digits and no clubbing  NEURO: alert & oriented x 3 with fluent speech, no focal motor/sensory deficits  Labs: her recent labs from hospitalization showed:                     RADIOGRAPHIC STUDIES:    EXAM: CT ANGIOGRAPHY CHEST WITH CONTRAST 08/02/2014 TECHNIQUE: Multidetector CT imaging of the chest was performed using the standard protocol during bolus administration of intravenous  contrast. Multiplanar CT image reconstructions and MIPs were obtained to evaluate the vascular anatomy. CONTRAST: 128mL OMNIPAQUE IOHEXOL 350 MG/ML SOLN COMPARISON: 02/01/2014 FINDINGS: There are no filling defect in the pulmonary arterial tree to suggest acute pulmonary thromboembolism. Three vessel coronary artery calcifications. Stable 1.3 cm subcarinal lymph node.  No pneumothorax. No pleural effusion. Lungs are under aerated with dependent atelectasis bilaterally. Review of the MIP images confirms the above findings. IMPRESSION: No evidence of acute pulmonary thromboembolism. Stable borderline enlarged subcarinal lymph node.   EXAM: CT ABDOMEN AND PELVIS WITH CONTRAST 08/02/2014 TECHNIQUE: Multidetector CT imaging of the abdomen and pelvis was performed using the standard protocol following bolus administration of intravenous contrast. CONTRAST: 119mL OMNIPAQUE IOHEXOL 350 MG/ML SOLN COMPARISON: 04/06/2014  FINDINGS: Lower chest: See CT chest report Hepatobiliary: Pneumobilia, as before. Segment 7 mass, 1.8 x 1.9 cm, by my measurements previously 3.6 x 2.9 cm. Spleen: Intact Pancreas: Pancreatic tail observed, pancreatic head and body not seen -prior Whipple procedure.  Stomach/Bowel: Postoperative  findings in the proximal stomach as part of prior Whipple procedure. Several air- fluid levels and  proximal loops of small bowel. Appendix not well seen. Adrenals/urinary tract: Poor definition of the right adrenal gland due to progressive  porta hepatis adenopathy adjacent to the adrenal gland. Bilateral perirenal stranding. Mildly distended urinary bladder. No stones observed. At least partially duplicated right collecting system with some atrophy of the upper pole moiety. Vascular/Lymphatic: Prominently increased porta hepatis adenopathy. Conglomerate adenopathy between in the cava and portal vein has necrotic center and measures 4.7 by 3.0 cm -formerly there was no measurable node here. Additional scattered gastrohepatic ligament nodes are observed and there is an enlarging tumor deposit along the peritoneal margin adjacent to the right hepatic lobe measuring 0.9 x 1.2 cm. The dominant nodal mass adjacent to the portal vein causes prominent extrinsic narrowing of the portal vein without complete occlusion. No definite thrombus is observed in the portal vein. Splenic vein patent. Reproductive: Surgically absent Musculoskeletal: Intact Other: Non  IMPRESSION: 1. Mixed appearance of the abdomen, with reduced size of the right hepatic lobe mass but prominent increase in portacaval necrotic adenopathy causing extrinsic compression of the portal vein, and increase in the size of the nodular implant adjacent to the right hepatic lobe. Increase in gastrohepatic ligament adenopathy. Electronically Signed By: Sherryl Barters M.D. On: 08/02/2014 19:22             ASSESSMENT: Ailene Ravel 58 y.o. female with a history of Cholangiocarcinoma - Plan: Comprehensive metabolic panel (Cmet) - [facility], CA 19.9, CEA, NM PET Image Initial (PI) Skull Base To Thigh   PLAN:   1. Malignant neoplasm of extrahepatic bile ducts --She is without evidence for recurrent disease for over 5 years since the time of her diagnosis.  She had repeat imaging for the pulmonary nodules that showed resolution favoring a benign disease.  She was seen by Dr. Deatra Ina and CT of abdomen ordered as noted above.  Tumor markers CEA was mildly elevated but trending down in September  and CA 19-9 was normal. Now we have some new abdominal findings of progressive lymphadenopathy ? Infectious vs inflammatory vs neoplastic in origin. I am repeating her tumor markers now and setting up a PET scan for restaging. If her LN are strongly pet positive we will have to figure out a way to biopsy it and will present case in our GI multidisciplinary tumor board meeting.   2. Questionable Q Fever or Vasculitis. --Management per Dr. Linus Salmons.  .  Her labs were collected and were sent to [facility] for verification of Q Fever did not confirm this per patient.  She was evaluated by Dr.  Shearon Balo of [place] who suggests that she may have idiopathic granulomatous disease of the liver.    Liver biopsy have confirmed that it is benign done on 12/04/2013.       4. Follow-up.  --We will plan to see Courtney Stevens again in 1 week.  She was given a script for Norco/vicodin.  All questions were answered. The patient knows to call the clinic with any problems, questions or concerns. We can certainly see the patient much sooner if necessary.  I spent 30 minutes counseling the patient face to face. The total time spent in the appointment was 40 minutes.    Bernadene Bell, MD Medical Hematologist/Oncologist [place] Pager: [phone] Office No: [phone]

## 2014-08-29 ENCOUNTER — Telehealth: Payer: Self-pay | Admitting: *Deleted

## 2014-08-29 NOTE — Telephone Encounter (Signed)
Patient called asking if PET scan results would be ready on Friday at 4pm before seeing Dr. Ailene Ravel. I contacted [facility] to have scan changed to STAT, and results should be ready prior to seeing Dr. Ailene Ravel. Patient voices understanding to keep her Wednesday lab appt, PET scan at 2 pm on 09/02/14 with 4 pm appt with Dr. Ailene Ravel.

## 2014-08-31 ENCOUNTER — Other Ambulatory Visit (HOSPITAL_BASED_OUTPATIENT_CLINIC_OR_DEPARTMENT_OTHER): Payer: BC Managed Care – PPO

## 2014-08-31 DIAGNOSIS — C221 Intrahepatic bile duct carcinoma: Secondary | ICD-10-CM

## 2014-08-31 LAB — COMPREHENSIVE METABOLIC PANEL (CC13)
ALK PHOS: 156 U/L — AB (ref 40–150)
ALT: 24 U/L (ref 0–55)
AST: 27 U/L (ref 5–34)
Albumin: 2.6 g/dL — ABNORMAL LOW (ref 3.5–5.0)
Anion Gap: 10 mEq/L (ref 3–11)
BUN: 10.6 mg/dL (ref 7.0–26.0)
CO2: 27 mEq/L (ref 22–29)
CREATININE: 1.2 mg/dL — AB (ref 0.6–1.1)
Calcium: 9 mg/dL (ref 8.4–10.4)
Chloride: 101 mEq/L (ref 98–109)
Glucose: 175 mg/dl — ABNORMAL HIGH (ref 70–140)
Potassium: 3.3 mEq/L — ABNORMAL LOW (ref 3.5–5.1)
Sodium: 138 mEq/L (ref 136–145)
Total Bilirubin: 0.25 mg/dL (ref 0.20–1.20)
Total Protein: 6.8 g/dL (ref 6.4–8.3)

## 2014-08-31 LAB — CEA: CEA: 14.2 ng/mL — ABNORMAL HIGH (ref 0.0–5.0)

## 2014-08-31 LAB — CANCER ANTIGEN 19-9: CA 19-9: 24.4 U/mL (ref <35.0)

## 2014-09-01 ENCOUNTER — Telehealth: Payer: Self-pay | Admitting: Hematology

## 2014-09-01 NOTE — Telephone Encounter (Signed)
Pt called back to confirmed r/s MD 10/16 per provider sch to 10/19 w/Dr. Renella Cunas nurse Shirlean Mylar..... KJ

## 2014-09-02 ENCOUNTER — Ambulatory Visit (INDEPENDENT_AMBULATORY_CARE_PROVIDER_SITE_OTHER): Payer: BC Managed Care – PPO | Admitting: Internal Medicine

## 2014-09-02 ENCOUNTER — Encounter: Payer: Self-pay | Admitting: Internal Medicine

## 2014-09-02 ENCOUNTER — Ambulatory Visit (HOSPITAL_COMMUNITY)
Admission: RE | Admit: 2014-09-02 | Discharge: 2014-09-02 | Disposition: A | Discharge status: Home / Self Care | Payer: BC Managed Care – PPO | Source: Ambulatory Visit | Attending: Hematology | Admitting: Hematology

## 2014-09-02 ENCOUNTER — Ambulatory Visit: Payer: BC Managed Care – PPO

## 2014-09-02 VITALS — BP 140/88 | HR 108 | Temp 100.6°F | Resp 16 | Ht 67.0 in | Wt 169.8 lb

## 2014-09-02 DIAGNOSIS — C221 Intrahepatic bile duct carcinoma: Secondary | ICD-10-CM | POA: Insufficient documentation

## 2014-09-02 DIAGNOSIS — J0410 Acute tracheitis without obstruction: Secondary | ICD-10-CM

## 2014-09-02 LAB — GLUCOSE, CAPILLARY: GLUCOSE-CAPILLARY: 153 mg/dL — AB (ref 70–99)

## 2014-09-02 MED ORDER — PREDNISONE 20 MG PO TABS: ORAL_TABLET | ORAL | Status: DC

## 2014-09-02 MED ORDER — PROMETHAZINE-DM 6.25-15 MG/5ML PO SYRP: ORAL_SOLUTION | ORAL | Status: DC

## 2014-09-02 MED ORDER — FLUDEOXYGLUCOSE F - 18 (FDG) INJECTION
9.5000 | Freq: Once | INTRAVENOUS | Status: AC | PRN
  Administered 2014-09-02: 9.5 via INTRAVENOUS

## 2014-09-02 MED ORDER — BENZONATATE 100 MG PO CAPS: ORAL_CAPSULE | ORAL | Status: DC

## 2014-09-02 MED ORDER — AZITHROMYCIN 250 MG PO TABS: ORAL_TABLET | ORAL | Status: DC

## 2014-09-02 NOTE — Progress Notes (Signed)
Subjective:    Patient ID: Courtney Stevens, female    DOB: 1956-10-30, 58 y.o.   MRN: [id number]  Cough This is a new problem. The current episode started in the past 7 days. The problem has been waxing and waning. The problem occurs every few minutes. The cough is productive of sputum. Associated symptoms include chills, a fever, headaches, nasal congestion and a sore throat. Pertinent negatives include no chest pain, ear congestion, ear pain, heartburn, hemoptysis, myalgias, postnasal drip, rash, rhinorrhea, shortness of breath, sweats, weight loss or wheezing. She has tried nothing for the symptoms. Her past medical history is significant for bronchitis and environmental allergies.   Medication List   ALPRAZolam 1 MG tablet  Commonly known as:  XANAX  Take 1 mg by mouth at bedtime as needed for anxiety (can take up to three times daily).     aspirin 81 MG tablet  Take 81 mg by mouth daily.     benzonatate 100 MG capsule                                                               NEW  Commonly known as:  TESSALON PERLES  Take 1 perle 3 x day to prevent cough     calcium citrate-vitamin D 315-200 MG-UNIT per tablet  Commonly known as:  CITRACAL+D  Take 1 tablet by mouth 3 (three) times daily.     cetirizine 10 MG tablet  Commonly known as:  ZYRTEC  Take 10 mg by mouth every morning.     ciprofloxacin 500 MG tablet  Commonly known as:  CIPRO  Take 1 tablet (500 mg total) by mouth 2 (two) times daily.     ferrous sulfate 325 (65 FE) MG tablet  Take 325 mg by mouth daily with breakfast.     furosemide 40 MG tablet  Commonly known as:  LASIX  Take 1 tablet (40 mg total) by mouth daily.     HYDROcodone-acetaminophen 5-325 MG per tablet                        NEW  Commonly known as:  NORCO/VICODIN  Take 1 tablet by mouth every 4 (four) hours as needed for moderate pain.     levothyroxine 200 MCG tablet  Commonly known as:  SYNTHROID, LEVOTHROID  Take 100-200 mcg by mouth  daily before breakfast. Alternate taking 100 mg and 200 mg     lipase/protease/amylase 12000 UNITS Cpep capsule  Commonly known as:  CREON  Take 1 capsule by mouth 3 (three) times daily before meals.     Magnesium 250 MG Tabs  Take 1 tablet by mouth 4 (four) times daily.     metFORMIN 500 MG tablet  Commonly known as:  GLUCOPHAGE  Take 1,000 mg by mouth 2 (two) times daily with a meal.     multivitamin with minerals tablet  Take 1 tablet by mouth daily.     omeprazole 40 MG capsule  Commonly known as:  PRILOSEC  Take 40 mg by mouth 2 (two) times daily.     potassium chloride SA 20 MEQ tablet  Commonly known as:  K-DUR,KLOR-CON  Take 20 mEq by mouth 2 (two) times daily.     predniSONE 20  MG tablet                                                                      NEW  Commonly known as:  DELTASONE  1 tab 3 x day for 3 days, then 1 tab 2 x day for 3 days, then 1 tab 1 x day for 5 days     promethazine-dextromethorphan 6.25-15 MG/5ML syrup                 NEW  Commonly known as:  PROMETHAZINE-DM  Take 1 to 2 tsp 4 x day or every 4 hs if needed for cough     Vitamin D (Ergocalciferol) 50000 UNITS Caps capsule  Commonly known as:  DRISDOL  Take 50,000 Units by mouth daily.     Allergies  Allergen Reactions  . Ace Inhibitors Anaphylaxis  . Tilade [Nedocromil] Anaphylaxis  . Reglan [Metoclopramide] Nausea And Vomiting  . Betadine [Povidone Iodine] Rash    iching    Past Medical History  Diagnosis Date  . Cancer     bile duct ca  . GERD (gastroesophageal reflux disease)   . Diabetes mellitus   . Hypertension   . Hypothyroid   . Hyperlipidemia   . Asthma   . Hepatitis    Review of Systems  Constitutional: Positive for fever and chills. Negative for weight loss.  HENT: Positive for congestion, sinus pressure and sore throat. Negative for dental problem, ear discharge, ear pain, facial swelling, hearing loss, mouth sores, nosebleeds, postnasal drip, rhinorrhea,  tinnitus and trouble swallowing.   Eyes: Negative.   Respiratory: Positive for cough. Negative for hemoptysis, shortness of breath, wheezing and stridor.   Cardiovascular: Negative.  Negative for chest pain.  Gastrointestinal: Negative.  Negative for heartburn.  Musculoskeletal: Negative for myalgias.  Skin: Negative for rash.  Allergic/Immunologic: Positive for environmental allergies.  Neurological: Positive for headaches. Negative for dizziness, seizures, facial asymmetry, speech difficulty, light-headedness and numbness.   BP 140/88  Pulse 108  Temp100.6 F   Resp 16  Ht 5\' 7"   Wt 169 lb 12.8 oz (  BMI 26.59  Objective:   Physical Exam  Constitutional: She is oriented to person, place, and time. She appears well-nourished. No distress.  HENT:  Right Ear: External ear normal.  Left Ear: External ear normal.  Nose: Nose normal.  Mouth/Throat: No oropharyngeal exudate.  Sl tender Bilat Maxillary areas.   Eyes: Conjunctivae and EOM are normal. Pupils are equal, round, and reactive to light. Right eye exhibits no discharge. Left eye exhibits discharge.  Neck: Normal range of motion. Neck supple. No thyromegaly present.  Cardiovascular: Normal rate, regular rhythm and normal heart sounds.   No murmur heard. Pulmonary/Chest: She is in respiratory distress. She has no wheezes. She has rales.  Abdominal: Soft.  Musculoskeletal: Normal range of motion. She exhibits no edema.  Lymphadenopathy:    She has no cervical adenopathy.  Neurological: She is alert and oriented to person, place, and time. No cranial nerve deficit. Coordination normal.  Skin: Skin is warm and dry. No rash noted. No erythema. No pallor.   Assessment & Plan:   1. Tracheitis  -RX: Levaquin, Prednisone taper, & prn Norco

## 2014-09-05 ENCOUNTER — Encounter: Payer: Self-pay | Admitting: Hematology

## 2014-09-05 ENCOUNTER — Ambulatory Visit (HOSPITAL_BASED_OUTPATIENT_CLINIC_OR_DEPARTMENT_OTHER): Payer: BC Managed Care – PPO | Admitting: Hematology

## 2014-09-05 VITALS — BP 158/76 | HR 63 | Temp 98.7°F | Resp 18 | Ht 67.0 in | Wt 166.0 lb

## 2014-09-05 DIAGNOSIS — E119 Type 2 diabetes mellitus without complications: Secondary | ICD-10-CM

## 2014-09-05 DIAGNOSIS — C221 Intrahepatic bile duct carcinoma: Secondary | ICD-10-CM

## 2014-09-05 DIAGNOSIS — B1910 Unspecified viral hepatitis B without hepatic coma: Secondary | ICD-10-CM

## 2014-09-05 DIAGNOSIS — I776 Arteritis, unspecified: Secondary | ICD-10-CM

## 2014-09-05 DIAGNOSIS — I10 Essential (primary) hypertension: Secondary | ICD-10-CM

## 2014-09-05 NOTE — Patient Instructions (Signed)
We told patient that we will call her after we discuss her case in the multidisciplinary gi meeting and i speak with dr Deatra Ina and her rheumatologist at [facility].

## 2014-09-05 NOTE — Progress Notes (Signed)
[place] ONCOLOGY OFFICE PROGRESS NOTE  Date of visit: 09/05/2014  [name], MD [address] [place] [address]  DIAGNOSIS: Malignant Neoplasm of extrahepatic Bile ducts.  Chief Complaint  Patient presents with  . Follow-up    CURRENT THERAPY:  Surveillance  PATIENT IDENTIFICATION:  Courtney Stevens 58 y.o. 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 She underwent a pancreaticoduodenectomy (Whipple) by Dr. Birdie Sons at [facility] center on 02/22/2008.  She was last seen by me on 08/26/2014. She was hospitalized from 12/03/2013 through 12/15/2013 due to fever, night sweats and abdominal symptoms. Her MRI of abdomen indicated a new peripherally enhancing mass posteromedially within the right hepatic lobe, highly worrisome for local recurrence or metastatic disease from the patient's bile duct adenocarcinoma. She had 2 biopsies which were negative for malignancy.  Her second liver biopsy (12/04/2013) demonstrated acute and chronic granulomatous inflammation with associated granulomata and  she was found to have antibody to Q fever.  She was discharged to complete a one month course of doxycycline per infectious disease (Dr. Linus Salmons).    She denies any additional recent emergency room visits or hospitalizations.  She reports good energy and appetite.  She saw her PCP Dr. Unk Pinto this past May.  She continues creon 3 tablets with meals.  Her weight has been stable.  She reports her last colonoscopy was less than one year ago with one tubulus villous polyp removed and she has to follow up for repeat colonoscopy in 3 years.   Today, she feels well, no nausea, vomiting, jaundice, abdominal pain or weight loss. She is recovering froman upper respiratory tract infection and on prednisone taper and Z-pak and cough  medications.  She did have a spider bite back in May 2015 which required antibiotics.  It bit her on her left arm.  She was referred to an infectious disease specialist from [place] for further evaluation of her possible Q fever.  She reports that they were concern for idiopathic granulomata hepatitis and that given repeat Q fever testing that was negative, it was likely a false positive.  She was also seen by Dr. Deatra Ina who ordered a CT of her abdomen which demonstrated the liver lesions had decreased in size.     She had a follow up with Dr Johnette Abraham. Clovis Cao, MD Chief of Rheumatology and Immunology at [facility] on 07/06/2014 and was found to have low potassium 2.9 and was told to take 60 meq KCL. All her other labs and x-ray studies were either negative or normal. The c-ANCA was tested negative at [facility] lab and Dr Francella Solian. Courtney Stevens was not worried about possibility of polyangiitis with granulomatosis. There was no signs of active vasculitis. She has a follow up there in  December 2015.   Tests which were done and she brought a copy for me included Urine random protein <12 mg/dl, yrine random creatinine <200 mg/dl, UA showing no protein, CXR (07/06/2014) showing no acute cardiopulmonary disease, a CBC which was normal, BMP showing low K+ 2.9 but normal electrolytes and creatinine at 1.0 mg, CRP 0.17 mg normal, c-ANCA negative, C3 129, C4 27. They recommended a 3 month follow up.  INTERVAL HISTORY:  Patient was hospitalized at [facility] long on 08/02/2014 with complaints of abdominal pain. In the ER she had a CAT scan of the chest abdomen and pelvis performed  showing new portacaval adenopathy causing extrinsic compression of the portal vein in addition to an increase in size of nodular implant adjacent to the right hepatic lobe. There was also an increase in the gastrohepatic ligament adenopathy. Her case was discussed with the interventional radiology and they were not able to biopsy the lymph node because it is located in  an area the biopsy would have to go through the last time and the liver. Therefore, a surveillance strategy was recommended that is to do another scan in a month. She was discharged on ciprofloxacin for 10 days and there was an improvement in her abdominal pain. 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 I reviewed the scans today.  I ordered patient to have repeat labs that his liver function tests and tumor markers CEA and CA 19-9 to be done and ordered a PET CT scan for better evaluation of this adenopathy in the background of diagnosis of cholangiocarcinoma.  This study was done on 09/02/2014 and I reviewed with patient and showed her the imaging studies.   CLINICAL DATA: Subsequent treatment strategy for cholangiocarcinoma. EXAM: NUCLEAR MEDICINE PET SKULL BASE TO THIGH TECHNIQUE: 9.5 mCi F-18 FDG was injected intravenously. Full-ring PET imaging was performed from the skull base to thigh after the radiotracer. CT data was obtained and used for attenuation correction and anatomic localization. FASTING BLOOD GLUCOSE: Value: 153 mg/dl  COMPARISON: CT of the chest, abdomen and pelvis 08/02/2014. FINDINGS: NECK No hypermetabolic lymph nodes in the neck. CHEST 14 mm short axis mildly hypermetabolic subcarinal lymph node (SUVmax  = 3.3). Borderline enlarged lower right paraesophageal lymph node measuring 8 mm is borderline hypermetabolic (SUVmax = 2.4). No hypermetabolic hilar nodes. No suspicious pulmonary nodules on the CT scan. Heart size is borderline enlarged. There is no significant pericardial fluid, thickening or pericardial calcification. There is atherosclerosis of the thoracic aorta, the great vessels of the mediastinum and the coronary arteries, including calcified atherosclerotic plaque in the left anterior descending, left circumflex and right coronary arteries. Esophagus is unremarkable in appearance. ABDOMEN/PELVIS There is extensive amorphous soft tissue in the central aspect of the  liver extending into the porta hepatis, which is hypermetabolic (SUVmax = 7.0), suspicious for malignancy such as the reported cholangiocarcinoma. Similarly, there is some very ill-defined amorphous low-attenuation soft tissue in the medial aspect of segment 7 of the liver, which is too ill-defined to be accurately measured, but is clearly hypermetabolic (SUVmax = 7.7). The infiltrative soft tissue of these regions are intimately associated  with multiple adjacent vascular structures, including the portal vein and IVC. There are several borderline enlarged upper abdominal lymph nodes, measuring up to 8 mm in the gastrohepatic ligament, however, these do not demonstrate hypermetabolism on the PET portion of the examination. Pneumobilia in the left lobe of the liver,  suggesting prior sphincterotomy. Status post cholecystectomy. No abnormal hypermetabolic activity within the pancreas, adrenal glands, or spleen. No hypermetabolic lymph nodes in the abdomen or pelvis. Numerous colonic diverticulae are noted, particularly in the region of the sigmoid colon, without surrounding inflammatory changes to suggest an acute diverticulitis at this time. Atherosclerosis throughout the abdominal and pelvic vasculature, without definite aneurysm. Status post hysterectomy. Ovaries are not confidently identified may be surgically absent or atrophic. SKELETON No focal hypermetabolic activity to suggest skeletal metastasis.   IMPRESSION:  1. Today's study demonstrates extensive hypermetabolism associated with the central masslike area in the liver which extends into the porta hepatis, as well as the lesion in the medial aspect of  segment 7 of the liver, suspicious for a malignant neoplasm such is the reported cholangiocarcinoma. There is a borderline enlarged low right paraesophageal lymph node which demonstrates some low-level metabolic activity (suspicious for its size), as well as a mildly enlarged subcarinal lymph node which  demonstrates low-level hypermetabolism, both of which are concerning for potential metastatic disease.  2. Additional findings, as above, similar to prior examinations. Electronically Signed By: Vinnie Langton M.D.  On: 09/02/2014 14:51              The tumor markers CEA and CA 19-9 show the following trend:      MEDICAL HISTORY: Past Medical History  Diagnosis Date  . Cancer     bile duct ca  . GERD (gastroesophageal reflux disease)   . Diabetes mellitus   . Hypertension   . Hypothyroid   . Hyperlipidemia   . Asthma   . 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    ALLERGIES:  is allergic to ace inhibitors; tilade; reglan; and betadine.  MEDICATIONS: has a current medication list which includes the following prescription(s): alprazolam, aspirin, azithromycin, benzonatate, calcium citrate-vitamin d, cetirizine, ferrous sulfate, furosemide, hydrocodone-acetaminophen, levothyroxine, lipase/protease/amylase, magnesium, metformin, multivitamin with minerals, omeprazole, potassium chloride sa, prednisone, promethazine-dextromethorphan, vitamin d (ergocalciferol), and ciprofloxacin.  SURGICAL HISTORY:  Past Surgical History  Procedure Laterality Date  . Gastric bypass  2004  . Cholecystectomy  1992  . Bile duct resection  2009  . Appendectomy    . Tonsillectomy    . Abdominal hysterectomy  1990    BIL OOPHORECTOMY  . Tubal ligation    . Breast surgery       BIOPSY NEG   PROBLEM LIST:  1. 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 The patient underwent a pancreaticoduodenectomy by Dr. Birdie Sons at [facility] on 02/22/2008. There was extensive involvement of peri-ductal soft tissue and extension to the duodenal wall and pancreas. Lymphovascular and perineural invasion was present. The posterior soft tissue margin was involved with tumor. Four out of 18 regional lymph nodes were involved by tumor. Tumor stage was pT4 pN1 pM0. Following surgery, the patient underwent radiation treatments in conjunction with continuous infusion 5 fluorouracil here in [place] from 04/01/2008 through 05/19/2008. She then received gemcitabine from 05/19/2008 through 12/02/2007. Because of a marked increase in CA19-9, the patient was felt to have a recurrence. This was not confirmed histologically and I believe imaging studies were also non-definitive. The patient received gemcitabine from 02/17/2009 through 05/25/2009. She then received Taxotere for 3 cycles from 06/15/2009 through 07/27/2009. The patient remains clinically disease-free on no treatment.  2. Elevated tumor markers. CEA and CA19-9 have been elevated with fluctuating levels.  3. Bilateral pulmonary nodules seen on CT scans from 02/19/2013.  4. History of malabsorption detected in early 2011, currently under  successful treatment.  5. Positive hepatitis B surface antibody and hepatitis B core  antibody.  6. Diabetes mellitus.  7. Hypertension.  8. Hypothyroidism diagnosed April 2011.  9. History of gastric bypass for obesity, Roux-en-Y, in 2004.  10.Diverticulosis.  11.Systolic ejection murmur. 12. Some kind of vasculitis (Polyangiitis or Wegener's granulomatosis) recent serologies negative.  REVIEW OF SYSTEMS:   Constitutional: Denies fevers, chills or abnormal weight loss Eyes: Denies blurriness of  vision Ears, nose, mouth, throat, and face: Denies mucositis or sore throat Respiratory: Denies cough, dyspnea or wheezes Cardiovascular: Denies palpitation, chest discomfort or lower extremity swelling Gastrointestinal:  Denies nausea, heartburn or change in bowel habits Skin: Denies abnormal skin rashes Lymphatics: Denies new lymphadenopathy or easy bruising Neurological:Denies numbness, tingling or new weaknesses Behavioral/Psych: Mood is stable, no new changes  All other systems were reviewed with the patient and are negative.  PHYSICAL EXAMINATION: ECOG PERFORMANCE STATUS: 0  Blood pressure 158/76, pulse 63, temperature 98.7 F (37.1 C), temperature source Oral, resp. rate 18, height 5\' 7"  (1.702 m), weight 166 lb (75.297 kg).  GENERAL:alert, no distress and comfortable SKIN: skin color, texture, turgor are normal, no rashes or significant lesions EYES: normal, Conjunctiva are pink and non-injected, sclera clear OROPHARYNX:no exudate, no erythema and lips, buccal mucosa, and tongue normal  NECK: supple, thyroid normal size, non-tender, without nodularity LYMPH:  no palpable lymphadenopathy in the cervical, axillary or supraclavicular LUNGS: clear to auscultation and percussion with normal breathing effort HEART: regular rate & rhythm and SEM II/VI and no lower extremity  ABDOMEN:abdomen soft, non-tender and normal bowel sounds, post surgical scars well healed. No organomegaly appreciated.  Musculoskeletal:no cyanosis of digits and no clubbing  NEURO: alert & oriented x 3 with fluent speech, no focal motor/sensory deficits  Labs: her recent labs from hospitalization showed:         RADIOGRAPHIC STUDIES:    EXAM: CT ANGIOGRAPHY CHEST WITH CONTRAST 08/02/2014 TECHNIQUE: Multidetector CT imaging of the chest was performed using the standard protocol during bolus administration of intravenous  contrast. Multiplanar CT image reconstructions and MIPs were obtained to evaluate  the vascular anatomy. CONTRAST: 143mL OMNIPAQUE IOHEXOL 350 MG/ML SOLN COMPARISON: 02/01/2014 FINDINGS: There are no filling defect in the pulmonary arterial tree to suggest acute pulmonary thromboembolism. Three vessel coronary artery calcifications. Stable 1.3 cm subcarinal lymph node.  No pneumothorax. No pleural effusion. Lungs are under aerated with dependent atelectasis bilaterally. Review of the MIP images confirms the above findings. IMPRESSION: No evidence of acute pulmonary thromboembolism. Stable borderline enlarged subcarinal lymph node.   EXAM: CT ABDOMEN AND PELVIS WITH CONTRAST 08/02/2014 TECHNIQUE: Multidetector CT imaging of the abdomen and pelvis was performed using the standard protocol following bolus administration of intravenous contrast. CONTRAST: 173mL OMNIPAQUE IOHEXOL 350 MG/ML SOLN COMPARISON: 04/06/2014  FINDINGS: Lower chest: See CT chest report Hepatobiliary: Pneumobilia, as before. Segment 7 mass, 1.8 x 1.9 cm, by my measurements previously 3.6 x 2.9 cm. Spleen: Intact Pancreas: Pancreatic tail observed, pancreatic head and body not seen -prior Whipple procedure.  Stomach/Bowel: Postoperative findings in the proximal stomach as part of prior Whipple procedure. Several air- fluid levels and  proximal loops of small bowel. Appendix not well seen. Adrenals/urinary tract: Poor definition of the right adrenal gland due to progressive porta hepatis adenopathy adjacent to the adrenal gland. Bilateral perirenal stranding. Mildly distended urinary bladder. No stones observed. At least partially duplicated right collecting system with some atrophy of the upper pole moiety. Vascular/Lymphatic: Prominently increased porta hepatis adenopathy. Conglomerate adenopathy between in the cava and portal vein has necrotic center and measures 4.7 by 3.0 cm -formerly there was no measurable node here. Additional scattered gastrohepatic ligament nodes are observed and there is an enlarging tumor  deposit along the  peritoneal margin adjacent to the right hepatic lobe measuring 0.9 x 1.2 cm. The dominant nodal mass adjacent to the portal vein causes prominent extrinsic narrowing of the portal vein without complete occlusion. No definite thrombus is observed in the portal vein. Splenic vein patent. Reproductive: Surgically absent Musculoskeletal: Intact Other: Non  IMPRESSION: 1. Mixed appearance of the abdomen, with reduced size of the right hepatic lobe mass but prominent increase in portacaval necrotic adenopathy causing extrinsic compression of the portal vein, and increase in the size of the nodular implant adjacent to the right hepatic lobe. Increase in gastrohepatic ligament adenopathy. Electronically Signed By: Sherryl Barters M.D. On: 08/02/2014 19:22             ASSESSMENT: Courtney Stevens 58 y.o. female with a history of Cholangiocarcinoma - Plan: CBC with Differential, Comprehensive metabolic panel (C[facility]) - [facility], CA 19.9, CEA   PLAN:   1. Malignant neoplasm of extrahepatic bile ducts --She is without evidence for recurrent disease for over 5 years since the time of her diagnosis.  She had repeat imaging for the pulmonary nodules that showed resolution favoring a benign disease.  She was seen by Dr. Deatra Ina and CT of abdomen ordered as noted above.  Tumor markers CEA was mildly elevated but trending down in September and CA 19-9 was normal. Now we have some new abdominal findings of progressive lymphadenopathy ? Infectious vs inflammatory vs neoplastic in origin. I repeated her tumor markers now performed a PET scan for restaging. If her LN are strongly pet positive we will have to figure out a way to biopsy it and will present case in our GI multidisciplinary tumor board meeting.  --The Lymph nodes are negative but there are two areas of concern high SUV uptake in liver and it is biopsy proven granulamotous disease of liver. The question is should we continue surveillance or  monitoring or try to re-biopsy one of these PET avid areas. -- I will discuss this question with Dr Deatra Ina and discuss in our GI tumor board meeting on 09/08/2015 or 09/15/2015 and get back with patient.  -- I will also forward this information to her Rheumatologist Dr E. Clovis Cao, MD Chief of Rheumatology and Immunology at [facility] and get his input.   2. Questionable Q Fever or Vasculitis. --Management per Dr. Linus Salmons.  .  Her labs were collected and were sent to [facility] for verification of Q Fever did not confirm this per patient.  She was evaluated by Dr.  Shearon Balo of [place] who suggests that she may have idiopathic granulomatous disease of the liver.    Liver biopsy have confirmed that it is benign done on 12/04/2013.       4. Follow-up.  --We will plan to see Mrs. Krolikowski again in 3 months.  Do we need to scan her again or consider a GI or IR guided biopsy will be discussed in the multidisciplinary tumor board meeting. Technically it is a difficult procedure.  All questions were answered. The patient knows to call the clinic with any problems, questions or concerns. We can certainly see the patient much sooner if necessary.  I spent 20 minutes counseling the patient face to face. The total time spent in the appointment was 25 minutes.    Bernadene Bell, MD Medical Hematologist/Oncologist [place] Pager: [phone] Office No: [phone]

## 2014-09-06 ENCOUNTER — Telehealth: Payer: Self-pay | Admitting: Hematology

## 2014-09-06 NOTE — Telephone Encounter (Signed)
Confirm appt d/t for Jan 2016.

## 2014-09-07 ENCOUNTER — Telehealth: Payer: Self-pay | Admitting: *Deleted

## 2014-09-07 ENCOUNTER — Other Ambulatory Visit: Payer: Self-pay | Admitting: Internal Medicine

## 2014-09-07 MED ORDER — LEVOFLOXACIN 500 MG PO TABS: 500.0000 mg | ORAL_TABLET | Freq: Every day | ORAL | Status: DC

## 2014-09-07 NOTE — Telephone Encounter (Signed)
Patient called and states she finished Z-pak yesterday and stillhas head and chest congestion with green mucus.  Per Dr Melford Aase, [place] to send in Levaquin 500 mg.

## 2014-09-11 ENCOUNTER — Other Ambulatory Visit: Payer: Self-pay | Admitting: Emergency Medicine

## 2014-09-13 ENCOUNTER — Ambulatory Visit (INDEPENDENT_AMBULATORY_CARE_PROVIDER_SITE_OTHER): Payer: BC Managed Care – PPO | Admitting: Podiatry

## 2014-09-13 ENCOUNTER — Ambulatory Visit: Payer: Self-pay

## 2014-09-13 ENCOUNTER — Encounter: Payer: Self-pay | Admitting: Podiatry

## 2014-09-13 VITALS — BP 129/67 | HR 63 | Resp 16

## 2014-09-13 DIAGNOSIS — Q828 Other specified congenital malformations of skin: Secondary | ICD-10-CM

## 2014-09-13 DIAGNOSIS — E119 Type 2 diabetes mellitus without complications: Secondary | ICD-10-CM

## 2014-09-13 DIAGNOSIS — M21869 Other specified acquired deformities of unspecified lower leg: Secondary | ICD-10-CM

## 2014-09-13 DIAGNOSIS — M216X9 Other acquired deformities of unspecified foot: Secondary | ICD-10-CM

## 2014-09-13 DIAGNOSIS — M898X9 Other specified disorders of bone, unspecified site: Secondary | ICD-10-CM

## 2014-09-13 DIAGNOSIS — L84 Corns and callosities: Secondary | ICD-10-CM

## 2014-09-13 NOTE — Patient Instructions (Signed)
Diabetes and Foot Care Diabetes may cause you to have problems because of poor blood supply (circulation) to your feet and legs. This may cause the skin on your feet to become thinner, break easier, and heal more slowly. Your skin may become dry, and the skin may peel and crack. You may also have nerve damage in your legs and feet causing decreased feeling in them. You may not notice minor injuries to your feet that could lead to infections or more serious problems. Taking care of your feet is one of the most important things you can do for yourself.  HOME CARE INSTRUCTIONS  Wear shoes at all times, even in the house. Do not go barefoot. Bare feet are easily injured.  Check your feet daily for blisters, cuts, and redness. If you cannot see the bottom of your feet, use a mirror or ask someone for help.  Wash your feet with warm water (do not use hot water) and mild soap. Then pat your feet and the areas between your toes until they are completely dry. Do not soak your feet as this can dry your skin.  Apply a moisturizing lotion or petroleum jelly (that does not contain alcohol and is unscented) to the skin on your feet and to dry, brittle toenails. Do not apply lotion between your toes.  Trim your toenails straight across. Do not dig under them or around the cuticle. File the edges of your nails with an emery board or nail file.  Do not cut corns or calluses or try to remove them with medicine.  Wear clean socks or stockings every day. Make sure they are not too tight. Do not wear knee-high stockings since they may decrease blood flow to your legs.  Wear shoes that fit properly and have enough cushioning. To break in new shoes, wear them for just a few hours a day. This prevents you from injuring your feet. Always look in your shoes before you put them on to be sure there are no objects inside.  Do not cross your legs. This may decrease the blood flow to your feet.  If you find a minor scrape,  cut, or break in the skin on your feet, keep it and the skin around it clean and dry. These areas may be cleansed with mild soap and water. Do not cleanse the area with peroxide, alcohol, or iodine.  When you remove an adhesive bandage, be sure not to damage the skin around it.  If you have a wound, look at it several times a day to make sure it is healing.  Do not use heating pads or hot water bottles. They may burn your skin. If you have lost feeling in your feet or legs, you may not know it is happening until it is too late.  Make sure your health care provider performs a complete foot exam at least annually or more often if you have foot problems. Report any cuts, sores, or bruises to your health care provider immediately. SEEK MEDICAL CARE IF:   You have an injury that is not healing.  You have cuts or breaks in the skin.  You have an ingrown nail.  You notice redness on your legs or feet.  You feel burning or tingling in your legs or feet.  You have pain or cramps in your legs and feet.  Your legs or feet are numb.  Your feet always feel cold. SEEK IMMEDIATE MEDICAL CARE IF:   There is increasing redness,   swelling, or pain in or around a wound.  There is a red line that goes up your leg.  Pus is coming from a wound.  You develop a fever or as directed by your health care provider.  You notice a bad smell coming from an ulcer or wound. Document Released: 11/01/2000 Document Revised: 07/07/2013 Document Reviewed: 04/13/2013 ExitCare Patient Information 2015 [employer]. This information is not intended to replace advice given to you by your health care provider. Make sure you discuss any questions you have with your health care provider.  

## 2014-09-13 NOTE — Progress Notes (Signed)
She presents today for follow-up of her diabetic foot is well as her porokeratosis and calluses plantar aspect of bilateral foot. She denies fever chills nausea vomiting muscle aches and pains. She denies any changes in her past medical history medications allergy surgery social history and review of systems.  Objective: Her signs are stable she is alert and oriented 3. Pulses are strongly palpable bilateral. Neurologic sensorium is intact her Semmes-Weinstein monofilament. Deep tendon reflexes are intact bilateral. Nails are normal with normal cutaneous evaluation with exception of hyperkeratosis and porokeratosis and plantar aspects of bilateral foot IP joints of the hallux bilaterally as well as the first and fifth metatarsals plantarly.  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 I will follow up with her when necessary.

## 2014-09-18 ENCOUNTER — Other Ambulatory Visit: Payer: Self-pay | Admitting: Emergency Medicine

## 2014-09-21 ENCOUNTER — Other Ambulatory Visit: Payer: Self-pay | Admitting: Hematology

## 2014-09-21 ENCOUNTER — Telehealth: Payer: Self-pay | Admitting: Internal Medicine

## 2014-09-21 ENCOUNTER — Telehealth: Payer: Self-pay | Admitting: Hematology

## 2014-09-21 DIAGNOSIS — C240 Malignant neoplasm of extrahepatic bile duct: Secondary | ICD-10-CM

## 2014-09-21 NOTE — Telephone Encounter (Signed)
S/w pt confirmed MD referral visit w/Dr. Chase Caller on 12/04 @3 :00 .... Cherylann Banas

## 2014-09-21 NOTE — Telephone Encounter (Signed)
Dr Lona Kettle)  Jaclynn Guarneri was in [place] doing an EBUS you i called and asked me to take a look at PET Scan on this patient DAMIYA SANDEFUR and if patient is suitable for EBUS    - YEs, I think patient is suitable for EBUS   - However, I cam out of town next week (very likely) and can possibly only do it week of 16th after return; of course I will need to see her before that  - Alternative, I can have one of my other colleagues Dr Lamonte Sakai who does EBUS see her   Please let me know  Thanks  Dr. Brand Males, M.D., [facility].C.P Pulmonary and Critical Care Medicine Staff Physician [place] [facility] Pager: [phone], If no answer or between  15:00h - 7:00h: call [phone]  09/21/2014 12:11 PM

## 2014-09-25 ENCOUNTER — Other Ambulatory Visit: Payer: Self-pay | Admitting: Emergency Medicine

## 2014-09-25 NOTE — Telephone Encounter (Signed)
Courtney Stevens  Please see if you can work this patient in before 11/11/5 provided I can get her into EBUS 10/05/14 at OR at [facility]. Let us talk about this on Monday 09/26/14   Thanks  Dr. Brand Males, M.D., F.C.C.P Pulmonary and Critical Care Medicine Staff Physician [place] [facility] Pager: [phone], If no answer or between  15:00h - 7:00h: call [phone]  09/25/2014 6:07 PM

## 2014-09-26 NOTE — Telephone Encounter (Signed)
LMTCB for pt. Will need to schedule pt to see MR on 11/10 at 2pm.

## 2014-09-26 NOTE — Telephone Encounter (Signed)
Did you call this in 09/19/2014? Please check.

## 2014-09-27 ENCOUNTER — Encounter: Payer: Self-pay | Admitting: Internal Medicine

## 2014-09-27 ENCOUNTER — Ambulatory Visit (INDEPENDENT_AMBULATORY_CARE_PROVIDER_SITE_OTHER): Payer: BC Managed Care – PPO | Admitting: Physician Assistant

## 2014-09-27 ENCOUNTER — Ambulatory Visit (INDEPENDENT_AMBULATORY_CARE_PROVIDER_SITE_OTHER): Payer: BC Managed Care – PPO | Admitting: Internal Medicine

## 2014-09-27 VITALS — BP 126/78 | HR 76 | Ht 67.0 in | Wt 165.0 lb

## 2014-09-27 VITALS — BP 140/82 | HR 76 | Temp 98.1°F | Resp 16 | Ht 67.0 in | Wt 164.4 lb

## 2014-09-27 DIAGNOSIS — R109 Unspecified abdominal pain: Secondary | ICD-10-CM

## 2014-09-27 DIAGNOSIS — E782 Mixed hyperlipidemia: Secondary | ICD-10-CM

## 2014-09-27 DIAGNOSIS — E559 Vitamin D deficiency, unspecified: Secondary | ICD-10-CM

## 2014-09-27 DIAGNOSIS — R599 Enlarged lymph nodes, unspecified: Secondary | ICD-10-CM

## 2014-09-27 DIAGNOSIS — E1122 Type 2 diabetes mellitus with diabetic chronic kidney disease: Secondary | ICD-10-CM

## 2014-09-27 DIAGNOSIS — R590 Localized enlarged lymph nodes: Secondary | ICD-10-CM

## 2014-09-27 DIAGNOSIS — R918 Other nonspecific abnormal finding of lung field: Secondary | ICD-10-CM

## 2014-09-27 DIAGNOSIS — N189 Chronic kidney disease, unspecified: Secondary | ICD-10-CM

## 2014-09-27 DIAGNOSIS — Z23 Encounter for immunization: Secondary | ICD-10-CM

## 2014-09-27 DIAGNOSIS — I10 Essential (primary) hypertension: Secondary | ICD-10-CM

## 2014-09-27 DIAGNOSIS — Z79899 Other long term (current) drug therapy: Secondary | ICD-10-CM

## 2014-09-27 LAB — CBC WITH DIFFERENTIAL/PLATELET
Basophils Absolute: 0 10*3/uL (ref 0.0–0.1)
Basophils Relative: 0 % (ref 0–1)
Eosinophils Absolute: 0.1 10*3/uL (ref 0.0–0.7)
Eosinophils Relative: 2 % (ref 0–5)
HCT: 35.5 % — ABNORMAL LOW (ref 36.0–46.0)
HEMOGLOBIN: 11.6 g/dL — AB (ref 12.0–15.0)
LYMPHS ABS: 1.3 10*3/uL (ref 0.7–4.0)
Lymphocytes Relative: 18 % (ref 12–46)
MCH: 28 pg (ref 26.0–34.0)
MCHC: 32.7 g/dL (ref 30.0–36.0)
MCV: 85.5 fL (ref 78.0–100.0)
MONOS PCT: 8 % (ref 3–12)
Monocytes Absolute: 0.6 10*3/uL (ref 0.1–1.0)
NEUTROS ABS: 5.3 10*3/uL (ref 1.7–7.7)
NEUTROS PCT: 72 % (ref 43–77)
Platelets: 327 10*3/uL (ref 150–400)
RBC: 4.15 MIL/uL (ref 3.87–5.11)
RDW: 15.3 % (ref 11.5–15.5)
WBC: 7.4 10*3/uL (ref 4.0–10.5)

## 2014-09-27 LAB — BASIC METABOLIC PANEL WITH GFR
BUN: 10 mg/dL (ref 6–23)
CO2: 28 meq/L (ref 19–32)
Calcium: 8.6 mg/dL (ref 8.4–10.5)
Chloride: 98 mEq/L (ref 96–112)
Creat: 1.15 mg/dL — ABNORMAL HIGH (ref 0.50–1.10)
GFR, EST AFRICAN AMERICAN: 60 mL/min
GFR, Est Non African American: 52 mL/min — ABNORMAL LOW
GLUCOSE: 163 mg/dL — AB (ref 70–99)
Potassium: 3.8 mEq/L (ref 3.5–5.3)
SODIUM: 138 meq/L (ref 135–145)

## 2014-09-27 LAB — HEPATIC FUNCTION PANEL
ALT: 17 U/L (ref 0–35)
AST: 18 U/L (ref 0–37)
Albumin: 3.3 g/dL — ABNORMAL LOW (ref 3.5–5.2)
Alkaline Phosphatase: 166 U/L — ABNORMAL HIGH (ref 39–117)
BILIRUBIN DIRECT: 0.1 mg/dL (ref 0.0–0.3)
Indirect Bilirubin: 0.1 mg/dL — ABNORMAL LOW (ref 0.2–1.2)
Total Bilirubin: 0.2 mg/dL (ref 0.2–1.2)
Total Protein: 6.2 g/dL (ref 6.0–8.3)

## 2014-09-27 LAB — LIPID PANEL
Cholesterol: 92 mg/dL (ref 0–200)
HDL: 41 mg/dL (ref >39)
LDL CALC: 25 mg/dL (ref 0–99)
Total CHOL/HDL Ratio: 2.2 Ratio
Triglycerides: 129 mg/dL (ref <150)
VLDL: 26 mg/dL (ref 0–40)

## 2014-09-27 LAB — TSH: TSH: 1.217 u[IU]/mL (ref 0.350–4.500)

## 2014-09-27 LAB — MAGNESIUM: Magnesium: 1.8 mg/dL (ref 1.5–2.5)

## 2014-09-27 MED ORDER — HYDROCODONE-ACETAMINOPHEN 5-325 MG PO TABS: 1.0000 | ORAL_TABLET | ORAL | Status: DC | PRN

## 2014-09-27 NOTE — Progress Notes (Addendum)
Patient ID: Courtney Stevens, female   DOB: 01/09/1956, 58 y.o.   MRN: [id number]   This very nice 58 y.o.MWF presents for 3 month follow up with Hypertension, Hyperlipidemia, Pre-Diabetes and Vitamin D Deficiency.    Patient is treated for HTN & BP has been controlled at home. Today's BP: 140/82 mmHg. Patient has had no complaints of any cardiac type chest pain, palpitations, dyspnea/orthopnea/PND, dizziness, claudication, or dependent edema.   Hyperlipidemia is controlled with diet & meds. Patient denies myalgias or other med SE's. Last Lipids were at goal - Total Chol 145; HDL 61; LDL 61; Trig 117 on 06/27/2014.   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  Last A1c was  6.8% on  06/27/2014.   Further, the patient also has history of Vitamin D Deficiency and supplements vitamin D without any suspected side-effects. Last vitamin D was  69 on  06/27/2014.   She is on thyroid medication. Her medication was not changed last visit.  Lab Results  Component Value Date   TSH 2.148 06/27/2014  .   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 Suppose to have biopsy of liver while in the hospital in Sept but not done because the lymph node was located in the area where biopsy would be risky because it would have to be done through the diaphragm and the liver. So she is getting a bronchoscopy with Dr. Chase Caller with a biopsy of a lymphnode.  Has dry cough, denies SOB, CP. States she has bilateral leg pain.      Medication List   aspirin 81 MG tablet  Take 81 mg by mouth daily.     calcium citrate-vitamin D 315-200 MG-UNIT per tablet  Commonly known as:  CITRACAL+D  Take 1 tablet by mouth 3 (three)  times daily.     cetirizine 10 MG tablet  Commonly known as:  ZYRTEC  Take 10 mg by mouth every morning.     CREON 12000 UNITS Cpep capsule  Generic drug:  lipase/protease/amylase  TAKE ONE CAPSULE BY MOUTH 3 TIMES A DAY     ferrous sulfate 325 (65 FE) MG tablet  Take 325 mg by mouth daily with breakfast.     furosemide 40 MG tablet  Commonly known as:  LASIX  Take 1 tablet (40 mg total) by mouth daily.     HYDROcodone-acetaminophen 5-325 MG per tablet  Commonly known as:  NORCO/VICODIN  Take 1 tablet by mouth every 4 (four) hours as needed for moderate pain.     levothyroxine 200 MCG tablet  Commonly known as:  SYNTHROID, LEVOTHROID  Take 100-200 mcg by mouth daily before breakfast. Alternate taking 100 mg and 200 mg     Magnesium 250 MG Tabs  Take 1 tablet by mouth 4 (four) times daily.     metFORMIN 500 MG tablet  Commonly known as:  GLUCOPHAGE  Take 1,000 mg by mouth 2 (two) times daily with a meal.     multivitamin with minerals tablet  Take 1 tablet by mouth daily.     omeprazole 40 MG capsule  Commonly known as:  PRILOSEC  Take 40 mg by mouth 2 (two) times daily.     potassium chloride  SA 20 MEQ tablet  Commonly known as:  K-DUR,KLOR-CON  Take 20 mEq by mouth 2 (two) times daily.     promethazine-dextromethorphan 6.25-15 MG/5ML syrup  Commonly known as:  PROMETHAZINE-DM  Take 1 to 2 tsp 4 x day or every 4 hs if needed for cough     Vitamin D (Ergocalciferol) 50000 UNITS Caps capsule  Commonly known as:  DRISDOL  Take 50,000 Units by mouth daily.       Allergies  Allergen Reactions  . Ace Inhibitors Anaphylaxis  . Tilade [Nedocromil] Anaphylaxis  . Reglan [Metoclopramide] Nausea And Vomiting  . Betadine [Povidone Iodine] Rash    iching     PMHx:   Past Medical History  Diagnosis Date  . Cancer     bile duct ca  . GERD (gastroesophageal reflux disease)   . Diabetes mellitus   . Hypertension   . Hypothyroid   . Hyperlipidemia   . Asthma   .  Hepatitis    Immunization History  Administered Date(s) Administered  . Influenza Whole 08/18/2012  . Influenza-Unspecified 08/31/2013  . Pneumococcal Polysaccharide-23 11/18/2008  . Pneumococcal-Unspecified 06/14/1999  . Tdap 03/24/2014  . Zoster 03/11/2012   Past Surgical History  Procedure Laterality Date  . Gastric bypass  2004  . Cholecystectomy  1992  . Bile duct resection  2009  . Appendectomy    . Tonsillectomy    . Abdominal hysterectomy  1990    BIL OOPHORECTOMY  . Tubal ligation    . Breast surgery      BIOPSY NEG     FHx:    Reviewed / unchanged  SHx:    Reviewed / unchanged   Systems Review:  Constitutional: Denies fever, chills, wt changes, headaches, insomnia, fatigue, night sweats, change in appetite. Eyes: Denies redness, blurred vision, diplopia, discharge, itchy, watery eyes.  ENT: Denies discharge, congestion, post nasal drip, epistaxis, sore throat, earache, hearing loss, dental pain, tinnitus, vertigo, sinus pain, snoring.  CV: Denies chest pain, palpitations, irregular heartbeat, syncope, dyspnea, diaphoresis, orthopnea, PND, claudication or edema. Respiratory: denies cough, dyspnea, DOE, pleurisy, hoarseness, laryngitis, wheezing.  Gastrointestinal: Denies dysphagia, odynophagia, heartburn, reflux, water brash, abdominal pain or cramps, nausea, vomiting, bloating, diarrhea, constipation, hematemesis, melena, hematochezia  or hemorrhoids. Genitourinary: Denies dysuria, frequency, urgency, nocturia, hesitancy, discharge, hematuria or flank pain. Musculoskeletal: Denies arthralgias, myalgias, stiffness, jt. swelling, pain, limping or strain/sprain.  Skin: Denies pruritus, rash, hives, warts, acne, eczema or change in skin lesion(s). Neuro: No weakness, tremor, incoordination, spasms, paresthesia or pain. Psychiatric: Denies confusion, memory loss or sensory loss. Endo: Denies change in weight, skin or hair change.  Heme/Lymph: No excessive bleeding,  bruising or enlarged lymph nodes.  Exam:  BP 140/82 mmHg  Pulse 76  Temp(Src) 98.1 F (36.7 C)  Resp 16  Ht 5\' 7"  (1.702 m)  Wt 164 lb 6.4 oz (74.571 kg)  BMI 25.74 kg/m2  Appears well nourished and in no distress. Eyes: PERRLA, EOMs, conjunctiva no swelling or erythema. Sinuses: No frontal/maxillary tenderness ENT/Mouth: EAC's clear, TM's nl w/o erythema, bulging. Nares clear w/o erythema, swelling, exudates. Oropharynx clear without erythema or exudates. Oral hygiene is good. Tongue normal, non obstructing. Hearing intact.  Neck: Supple. Thyroid nl. Car 2+/2+ without bruits, nodes or JVD. Chest: Respirations nl with BS clear & equal w/o rales, rhonchi, wheezing or stridor.  Cor: Heart sounds normal w/ regular rate and rhythm without sig. murmurs, gallops, clicks, or rubs. Peripheral pulses normal and equal  without edema.  Abdomen: Soft &  bowel sounds normal. Non-tender w/o guarding, rebound, hernias, masses, or organomegaly.  Lymphatics: Unremarkable.  Musculoskeletal: Full ROM all peripheral extremities, joint stability, 5/5 strength, and normal gait.  Skin: Warm, dry without exposed rashes, lesions or ecchymosis apparent.  Neuro: Cranial nerves intact, reflexes equal bilaterally. Sensory-motor testing grossly intact. Tendon reflexes grossly intact.  Pysch: Alert & oriented x 3.  Insight and judgement nl & appropriate. No ideations.  Assessment and Plan:  1. Hypertension - Continue monitor blood pressure at home. Continue diet/meds same.  2. Hyperlipidemia - Continue diet/meds, exercise,& lifestyle modifications. Continue monitor periodic cholesterol/liver & renal functions   3. T2_NIDDM w/CKD2 - Continue diet, exercise, lifestyle modifications. Monitor appropriate labs.  4. Vitamin D Deficiency - Continue supplementation.  5. 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 ? Necrotic lymphnodes- follow up with GI/Pulmonary   Recommended regular exercise, BP  monitoring, weight control, and discussed med and SE's. Recommended labs to assess and monitor clinical status. Further disposition pending results of labs.

## 2014-09-27 NOTE — Progress Notes (Signed)
   Subjective:    Patient ID: Courtney Stevens, female    DOB: [birth year], 58 y.o.   MRN: [id number]  HPI    Review of Systems  Constitutional: Negative for fever and unexpected weight change.  HENT: Negative for congestion, dental problem, ear pain, nosebleeds, postnasal drip, rhinorrhea, sinus pressure, sneezing, sore throat and trouble swallowing.   Eyes: Negative for redness and itching.  Respiratory: Positive for cough. Negative for chest tightness, shortness of breath and wheezing.   Cardiovascular: Negative for palpitations and leg swelling.  Gastrointestinal: Negative for nausea and vomiting.  Genitourinary: Negative for dysuria.  Musculoskeletal: Negative for joint swelling.  Skin: Negative for rash.  Neurological: Negative for headaches.  Hematological: Does not bruise/bleed easily.  Psychiatric/Behavioral: Negative for dysphoric mood. The patient is not nervous/anxious.        Objective:   Physical Exam        Assessment & Plan:

## 2014-09-27 NOTE — Progress Notes (Signed)
Subjective:    Patient ID: Courtney Stevens, female    DOB: [birth year], 58 y.o.   MRN: [id number] PCP Unk Pinto DAVID, MD Onc: DR Lona Kettle  HPI  IOV 09/27/2014  Chief Complaint  Patient presents with  . Follow-up    Pt here to follow up before EBUS. Pt c/o mild dry cough. Pt denies SOB and CP/tightness.    Hx from Patient, [facility] Notes 07/06/14 and local onc Dr Lona Kettle notes 09/05/14   In 2004 - she is s/p  Gastric bypass surgery without complications In 1275 -  Diagnosed with cholangiocarcinima  PT51m, N1, MO (involed peri-dutal soft tissue, duodenal wall and pancreas and 4/18 regional nodes) of distal CBC and s/p whipple  At [facility] followed by chemo 5FU infusion and XRT In 2010 - she had increased CA19-9 and s/p chemo with gemcitabine and taxotered  Then well till end 2014  In End 2014 - developed FUUO  In Jan  And Feb 2015 - hospitalized at [facility]  X 2- MRI showed Liver Mass Right hepatic lobe and upper abd adenopathy. She was positive antibody for Q fever and Rx with prolonged doxy. 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 Otherwise workup was negative incuding CT sinus in March 2015, Echo,During this time also got prednisone for 1 week apparently  In April 2015 - saw Dr Dorothyann Peng at [facility]. Repat Q Fever antibody negatie and felt not have had Q Fever or other autoimmune disease. Did get short courses of pred x 2 (april and may 2015) for ? Spider bite ? At [facility]. 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 She at [facility] has ACE level 77 and C-ANCA at 1:320 but of these C-ANCA might have been negative at [facility] on repeat  In Sept 2014: hosspitalized at [place] [facility] on 08/02/2014 with complaints of abdominal pain. 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 There was also an increase in the gastrohepatic ligament adenopathy. Her case was discussed with the interventional radiology and they were not able to biopsy the lymph node because it is located in an area the biopsy would have to go through the last time and the liver. Therefore, a surveillance strategy was recommended that is to do another scan in a month. She was discharged on ciprofloxacin for 10 days and there was an improvement in her abdominal pain   In Oct 2015: at followup   her tumor marker of CA19-9 continued to be NORMAL. However, PET SCAN 1. 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  borderline enlarged low right paraesophageal lymph node which demonstrates some low-level metabolic activity (suspicious for its size), as well as a mildly enlarged subcarinal lymph node which demonstrates low-level hypermetabolism, both of which are concerning for potential metastatic disease.   Nov 2015  -  Patient was discussed at MDT rounds: and because of some uptake in enlarged have STation 7 Low uptake pet note and others in liver. MDT rounds felt this was best approached first with EBUS bronch and therefore referred to pulmonary.  - She does not endorse major pulmonary complaints     has a past medical history of Cancer; GERD (gastroesophageal reflux disease); Diabetes mellitus; Hypertension; Hypothyroid; Hyperlipidemia; Asthma; Hepatitis; Heart murmur; Anxiety; and Anemia.   has past surgical history that includes Gastric bypass (2004);  Cholecystectomy (1992); Bile Duct Resection (2009); Appendectomy; Tonsillectomy; Abdominal hysterectomy (1990); Tubal ligation; Breast surgery; and Bunionectomy (Right, 2005).  Allergies  Allergen Reactions  . Ace Inhibitors Anaphylaxis  . Tilade [Nedocromil] Anaphylaxis  . Reglan [Metoclopramide] Nausea And Vomiting  . Betadine [Povidone Iodine] Rash    iching     Immunization History  Administered Date(s) Administered  . Influenza Split 09/27/2014  . Influenza Whole 08/18/2012  . Influenza-Unspecified 08/31/2013  . Pneumococcal Polysaccharide-23 11/18/2008  . Pneumococcal-Unspecified 06/14/1999  . Tdap 03/24/2014  . Zoster 03/11/2012   Family History  Problem Relation Age of Onset  . Colon cancer Mother   . Hypertension Mother   . Glaucoma Mother   . Diabetes Father   . Alzheimer's disease Father   . Heart attack Father   . Nephrolithiasis Father   . Emphysema Father     was a smoker  . Heart disease Father   . Nephrolithiasis Sister   . Heart attack Brother   . Nephrolithiasis Brother       SOCIAL HX She has been married for 36 years, and has 2 adult children. 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 She enjoys crafts. She is not engaged in any regular exercise. She eats a normal diet. Daughter Museum/gallery conservator works in Physiological scientist   Review of Systems See RN - ROS above         Current outpatient prescriptions: ALPRAZolam (XANAX) 1 MG tablet, TAKE 1/2 TO 1 TABLET BY MOUTH UP TO 3 TIMES A DAY AS NEEDED, Disp: 90 tablet, Rfl: 1;  aspirin 81 MG tablet, Take 81 mg by mouth daily., Disp: , Rfl: ;  calcium citrate-vitamin D (CITRACAL+D) 315-200 MG-UNIT per tablet, Take 1 tablet by mouth 3 (three) times daily., Disp: , Rfl: ;  cetirizine (ZYRTEC) 10 MG tablet, Take 10 mg by mouth every morning.  , Disp: , Rfl:  CREON 12000 UNITS CPEP capsule, TAKE ONE CAPSULE BY MOUTH 3 TIMES A DAY, Disp: 270 capsule, Rfl: 99;  ferrous sulfate 325 (65 FE) MG tablet, Take  325 mg by mouth daily with breakfast., Disp: , Rfl: ;  furosemide (LASIX) 40 MG tablet, Take 1 tablet (40 mg total) by mouth daily., Disp: 30 tablet, Rfl: 0 HYDROcodone-acetaminophen (NORCO/VICODIN) 5-325 MG per tablet, Take 1 tablet by mouth every 4 (four) hours as needed for moderate pain., Disp: 30 tablet, Rfl: 0;  levothyroxine (SYNTHROID, LEVOTHROID) 200 MCG tablet, Take 100-200 mcg by mouth daily before breakfast. Alternate taking 100 mg and 200 mg, Disp: , Rfl: ;  Magnesium 250 MG TABS, Take 1 tablet by mouth 4 (four) times daily. , Disp: , Rfl:  metFORMIN (GLUCOPHAGE) 500 MG tablet, Take 1,000 mg by mouth 2 (two) times daily with a meal., Disp: , Rfl: ;  Multiple Vitamins-Minerals (MULTIVITAMIN WITH MINERALS) tablet, Take 1 tablet by mouth daily. , Disp: , Rfl: ;  omeprazole (PRILOSEC) 40 MG capsule, Take 40 mg by mouth 2 (two) times daily., Disp: ,  Rfl: ;  potassium chloride SA (K-DUR,KLOR-CON) 20 MEQ tablet, Take 20 mEq by mouth 2 (two) times daily. , Disp: , Rfl:  promethazine-dextromethorphan (PROMETHAZINE-DM) 6.25-15 MG/5ML syrup, Take 1 to 2 tsp 4 x day or every 4 hs if needed for cough, Disp: 240 mL, Rfl: 1;  Vitamin D, Ergocalciferol, (DRISDOL) 50000 UNITS CAPS capsule, Take 50,000 Units by mouth daily., Disp: , Rfl:    Objective:   Physical Exam  Constitutional: She is oriented to person, place, and time. She appears well-developed and well-nourished. No distress.  HENT:  Head: Normocephalic and atraumatic.  Right Ear: External ear normal.  Left Ear: External ear normal.  Mouth/Throat: Oropharynx is clear and moist. No oropharyngeal exudate.  Eyes: Conjunctivae and EOM are normal. Pupils are equal, round, and reactive to light. Right eye exhibits no discharge. Left eye exhibits no discharge. No scleral icterus.  Neck: Normal range of motion. Neck supple. No JVD present. No tracheal deviation present. No thyromegaly present.  Cardiovascular: Normal rate, regular rhythm, normal heart  sounds and intact distal pulses.  Exam reveals no gallop and no friction rub.   No murmur heard. Pulmonary/Chest: Effort normal and breath sounds normal. No respiratory distress. She has no wheezes. She has no rales. She exhibits no tenderness.  Abdominal: Soft. Bowel sounds are normal. She exhibits no distension and no mass. There is no tenderness. There is no rebound and no guarding.  Musculoskeletal: Normal range of motion. She exhibits no edema or tenderness.  Lymphadenopathy:    She has no cervical adenopathy.  Neurological: She is alert and oriented to person, place, and time. She has normal reflexes. No cranial nerve deficit. She exhibits normal muscle tone. Coordination normal.  Skin: Skin is warm and dry. No rash noted. She is not diaphoretic. No erythema. No pallor.  Psychiatric: She has a normal mood and affect. Her behavior is normal. Judgment and thought content normal.  Vitals reviewed.   Filed Vitals:   09/27/14 1408  BP: 126/78  Pulse: 76  Height: 5\' 7"  (1.702 m)  Weight: 165 lb (74.844 kg)  SpO2: 98%          Assessment & Plan:  MEdiastinal NOde  DDx - chronic non specific inflammation, granuloma from sarcoid or other causes, lymphoma. IT appears that diagnosis is uncertain with lvier biopsy showing granuloma earlier this year. Q fever was considered btu ruled out. She has some other abdominal nodes that are PET HOT but are techniically dificult to reach. PET scan also shows Station 7 Sub-carinal node that is somehwat pet hot. IN MDT meeting it was decided that this be biopsied first though there is limitation of non-diagnosis. She understand and agrees with this  - Arrange for EBUS  Risks of pneumothorax, hemothorax, sedation/anesthesia complications such as cardiac or respiratory arrest or hypotension, stroke and bleeding all explained. Benefits of diagnosis but limitations of non-diagnosis also explained. Patient verbalized understanding and wished to proceed.    Will get sample for flow   Dr. Brand Males, M.D., [facility].C.P Pulmonary and Critical Care Medicine Staff Physician [place] [facility] Pager: [phone], If no answer or between  15:00h - 7:00h: call [phone]  09/27/2014 2:36 PM

## 2014-09-27 NOTE — Telephone Encounter (Signed)
Called and spoke to pt. Appt made for 09/28/14 at 2p with MR. Pt aware of time and date. Nothing further needed.

## 2014-09-27 NOTE — Patient Instructions (Signed)

## 2014-09-27 NOTE — Patient Instructions (Addendum)
Arrange for EBUS 09/25/14 Wednesday at [facility] OR or if we cannot get that date 10/10/14 Monday at [place]   Followup  - depending on results

## 2014-09-28 LAB — HEMOGLOBIN A1C
Hgb A1c MFr Bld: 8.4 % — ABNORMAL HIGH (ref <5.7)
MEAN PLASMA GLUCOSE: 194 mg/dL — AB (ref <117)

## 2014-09-28 LAB — VITAMIN D 25 HYDROXY (VIT D DEFICIENCY, FRACTURES): Vit D, 25-Hydroxy: 83 ng/mL (ref 30–89)

## 2014-09-30 ENCOUNTER — Encounter: Payer: Self-pay | Admitting: Physician Assistant

## 2014-10-04 NOTE — Pre-Procedure Instructions (Signed)
BURNADETTE BASKETT  10/04/2014   Your procedure is scheduled on:  Wed, Nov 25 @ 8:30 AM  Report to [facility]  at 6:30 AM.  Call this number if you have problems the morning of surgery: [phone]   Remember:   Do not eat food or drink liquids after midnight.   Take these medicines the morning of surgery with A SIP OF WATER: Xanax(Alprazolam),Zyrtec(Cetirizine),Pain Pill(if needed),Synthroid(Levothyroxine),Omeprazole(Prilosec),and Promethazine(Phenergan-if needed)              Stop taking your Aspirin,Vitamins,and any Herbal Medications. No Goody's,BC's,Aleve,Ibuprofen,and Fish Oil.   Do not wear jewelry, make-up or nail polish.  Do not wear lotions, powders, or perfumes. You may wear deodorant.  Do not shave 48 hours prior to surgery.   Do not bring valuables to the hospital.  [facility] is not responsible                  for any belongings or valuables.               Contacts, dentures or bridgework may not be worn into surgery.  Leave suitcase in the car. After surgery it may be brought to your room.  For patients admitted to the hospital, discharge time is determined by your                treatment team.               Patients discharged the day of surgery will not be allowed to drive  home.             Special Instructions:  [place] - Preparing for Surgery  Before surgery, you can play an important role.  Because skin is not sterile, your skin needs to be as free of germs as possible.  You can reduce the number of germs on you skin by washing with CHG (chlorahexidine gluconate) soap before surgery.  CHG is an antiseptic cleaner which kills germs and bonds with the skin to continue killing germs even after washing.  Please DO NOT use if you have an allergy to CHG or antibacterial soaps.  If your skin becomes reddened/irritated stop using the CHG and inform your nurse when you arrive at Short Stay.  Do not shave (including legs and underarms) for at least 48 hours  prior to the first CHG shower.  You may shave your face.  Please follow these instructions carefully:   1.  Shower with CHG Soap the night before surgery and the                                morning of Surgery.  2.  If you choose to wash your hair, wash your hair first as usual with your       normal shampoo.  3.  After you shampoo, rinse your hair and body thoroughly to remove the                      Shampoo.  4.  Use CHG as you would any other liquid soap.  You can apply chg directly       to the skin and wash gently with scrungie or a clean washcloth.  5.  Apply the CHG Soap to your body ONLY FROM THE NECK DOWN.        Do not use on open wounds or open sores.  Avoid  contact with your eyes,       ears, mouth and genitals (private parts).  Wash genitals (private parts)       with your normal soap.  6.  Wash thoroughly, paying special attention to the area where your surgery        will be performed.  7.  Thoroughly rinse your body with warm water from the neck down.  8.  DO NOT shower/wash with your normal soap after using and rinsing off       the CHG Soap.  9.  Pat yourself dry with a clean towel.            10.  Wear clean pajamas.            11.  Place clean sheets on your bed the night of your first shower and do not        sleep with pets.  Day of Surgery  Do not apply any lotions/deoderants the morning of surgery.  Please wear clean clothes to the hospital/surgery center.     Please read over the following fact sheets that you were given: Pain Booklet, Coughing and Deep Breathing and Surgical Site Infection Prevention

## 2014-10-05 ENCOUNTER — Encounter (HOSPITAL_COMMUNITY): Payer: Self-pay

## 2014-10-05 ENCOUNTER — Encounter (HOSPITAL_COMMUNITY)
Admission: RE | Admit: 2014-10-05 | Discharge: 2014-10-05 | Disposition: A | Discharge status: Home / Self Care | Payer: BC Managed Care – PPO | Source: Ambulatory Visit | Attending: Orthopedic Surgery | Admitting: Orthopedic Surgery

## 2014-10-05 DIAGNOSIS — Z01812 Encounter for preprocedural laboratory examination: Secondary | ICD-10-CM | POA: Insufficient documentation

## 2014-10-05 DIAGNOSIS — R590 Localized enlarged lymph nodes: Secondary | ICD-10-CM | POA: Insufficient documentation

## 2014-10-05 HISTORY — DX: Anemia, unspecified: D64.9

## 2014-10-05 HISTORY — DX: Anxiety disorder, unspecified: F41.9

## 2014-10-05 LAB — CBC
HEMATOCRIT: 38.5 % (ref 36.0–46.0)
Hemoglobin: 12.7 g/dL (ref 12.0–15.0)
MCH: 28.8 pg (ref 26.0–34.0)
MCHC: 33 g/dL (ref 30.0–36.0)
MCV: 87.3 fL (ref 78.0–100.0)
Platelets: 302 10*3/uL (ref 150–400)
RBC: 4.41 MIL/uL (ref 3.87–5.11)
RDW: 15.9 % — ABNORMAL HIGH (ref 11.5–15.5)
WBC: 7.8 10*3/uL (ref 4.0–10.5)

## 2014-10-05 LAB — COMPREHENSIVE METABOLIC PANEL
ALBUMIN: 2.8 g/dL — AB (ref 3.5–5.2)
ALT: 16 U/L (ref 0–35)
ANION GAP: 14 (ref 5–15)
AST: 24 U/L (ref 0–37)
Alkaline Phosphatase: 151 U/L — ABNORMAL HIGH (ref 39–117)
BUN: 10 mg/dL (ref 6–23)
CO2: 27 mEq/L (ref 19–32)
CREATININE: 0.83 mg/dL (ref 0.50–1.10)
Calcium: 9.1 mg/dL (ref 8.4–10.5)
Chloride: 97 mEq/L (ref 96–112)
GFR calc Af Amer: 88 mL/min — ABNORMAL LOW (ref >90)
GFR calc non Af Amer: 76 mL/min — ABNORMAL LOW (ref >90)
Glucose, Bld: 190 mg/dL — ABNORMAL HIGH (ref 70–99)
Potassium: 3.7 mEq/L (ref 3.7–5.3)
Sodium: 138 mEq/L (ref 137–147)
Total Bilirubin: <0.2 mg/dL — ABNORMAL LOW (ref 0.3–1.2)
Total Protein: 6.9 g/dL (ref 6.0–8.3)

## 2014-10-10 ENCOUNTER — Encounter (HOSPITAL_COMMUNITY): Payer: BC Managed Care – PPO

## 2014-10-10 ENCOUNTER — Ambulatory Visit (HOSPITAL_COMMUNITY): Admit: 2014-10-10 | Payer: Self-pay | Admitting: Internal Medicine

## 2014-10-10 ENCOUNTER — Encounter (HOSPITAL_COMMUNITY): Payer: Self-pay

## 2014-10-10 SURGERY — ENDOBRONCHIAL ULTRASOUND (EBUS)
Anesthesia: General | Laterality: Bilateral

## 2014-10-11 MED ORDER — SITAGLIPTIN PHOSPHATE 100 MG PO TABS: 100.0000 mg | ORAL_TABLET | Freq: Every day | ORAL | Status: DC

## 2014-10-11 MED ORDER — LACTATED RINGERS IV SOLN: INTRAVENOUS | Status: DC

## 2014-10-12 ENCOUNTER — Ambulatory Visit (HOSPITAL_COMMUNITY)
Admission: RE | Admit: 2014-10-12 | Discharge: 2014-10-12 | Disposition: A | Discharge status: Home / Self Care | Payer: BC Managed Care – PPO | Source: Ambulatory Visit | Attending: Internal Medicine | Admitting: Internal Medicine

## 2014-10-12 ENCOUNTER — Encounter (HOSPITAL_COMMUNITY)
Admission: RE | Disposition: A | Discharge status: Home / Self Care | Payer: Self-pay | Source: Ambulatory Visit | Attending: Internal Medicine

## 2014-10-12 ENCOUNTER — Ambulatory Visit (HOSPITAL_COMMUNITY): Payer: BC Managed Care – PPO | Admitting: Critical Care Medicine

## 2014-10-12 ENCOUNTER — Encounter (HOSPITAL_COMMUNITY): Payer: Self-pay | Admitting: Critical Care Medicine

## 2014-10-12 DIAGNOSIS — E119 Type 2 diabetes mellitus without complications: Secondary | ICD-10-CM | POA: Diagnosis not present

## 2014-10-12 DIAGNOSIS — K219 Gastro-esophageal reflux disease without esophagitis: Secondary | ICD-10-CM | POA: Insufficient documentation

## 2014-10-12 DIAGNOSIS — F419 Anxiety disorder, unspecified: Secondary | ICD-10-CM | POA: Insufficient documentation

## 2014-10-12 DIAGNOSIS — R590 Localized enlarged lymph nodes: Secondary | ICD-10-CM | POA: Diagnosis present

## 2014-10-12 DIAGNOSIS — R599 Enlarged lymph nodes, unspecified: Secondary | ICD-10-CM

## 2014-10-12 DIAGNOSIS — I10 Essential (primary) hypertension: Secondary | ICD-10-CM | POA: Insufficient documentation

## 2014-10-12 DIAGNOSIS — E039 Hypothyroidism, unspecified: Secondary | ICD-10-CM | POA: Diagnosis not present

## 2014-10-12 DIAGNOSIS — J45909 Unspecified asthma, uncomplicated: Secondary | ICD-10-CM | POA: Insufficient documentation

## 2014-10-12 DIAGNOSIS — Z9884 Bariatric surgery status: Secondary | ICD-10-CM | POA: Insufficient documentation

## 2014-10-12 DIAGNOSIS — E785 Hyperlipidemia, unspecified: Secondary | ICD-10-CM | POA: Diagnosis not present

## 2014-10-12 HISTORY — PX: VIDEO BRONCHOSCOPY WITH ENDOBRONCHIAL ULTRASOUND: SHX6177

## 2014-10-12 LAB — GLUCOSE, CAPILLARY
GLUCOSE-CAPILLARY: 130 mg/dL — AB (ref 70–99)
GLUCOSE-CAPILLARY: 131 mg/dL — AB (ref 70–99)

## 2014-10-12 SURGERY — BRONCHOSCOPY, WITH EBUS
Anesthesia: General

## 2014-10-12 MED ORDER — SUCCINYLCHOLINE CHLORIDE 20 MG/ML IJ SOLN
INTRAMUSCULAR | Status: AC
  Filled 2014-10-12: qty 1

## 2014-10-12 MED ORDER — DIPHENHYDRAMINE HCL 50 MG/ML IJ SOLN
INTRAMUSCULAR | Status: DC | PRN
  Administered 2014-10-12: 12.5 mg via INTRAVENOUS

## 2014-10-12 MED ORDER — PROPOFOL 10 MG/ML IV BOLUS
INTRAVENOUS | Status: AC
  Filled 2014-10-12: qty 20

## 2014-10-12 MED ORDER — FENTANYL CITRATE 0.05 MG/ML IJ SOLN
INTRAMUSCULAR | Status: DC | PRN
  Administered 2014-10-12 (×2): 50 ug via INTRAVENOUS

## 2014-10-12 MED ORDER — OXYCODONE HCL 5 MG PO TABS: 5.0000 mg | ORAL_TABLET | Freq: Once | ORAL | Status: DC | PRN

## 2014-10-12 MED ORDER — EPHEDRINE SULFATE 50 MG/ML IJ SOLN
INTRAMUSCULAR | Status: AC
  Filled 2014-10-12: qty 1

## 2014-10-12 MED ORDER — LIDOCAINE HCL (CARDIAC) 20 MG/ML IV SOLN
INTRAVENOUS | Status: DC | PRN
  Administered 2014-10-12: 80 mg via INTRAVENOUS

## 2014-10-12 MED ORDER — DIPHENHYDRAMINE HCL 50 MG/ML IJ SOLN
INTRAMUSCULAR | Status: AC
  Filled 2014-10-12: qty 1

## 2014-10-12 MED ORDER — 0.9 % SODIUM CHLORIDE (POUR BTL) OPTIME
TOPICAL | Status: DC | PRN
  Administered 2014-10-12: 1000 mL

## 2014-10-12 MED ORDER — FENTANYL CITRATE 0.05 MG/ML IJ SOLN
INTRAMUSCULAR | Status: AC
  Filled 2014-10-12: qty 5

## 2014-10-12 MED ORDER — OXYCODONE HCL 5 MG/5ML PO SOLN: 5.0000 mg | Freq: Once | ORAL | Status: DC | PRN

## 2014-10-12 MED ORDER — DEXAMETHASONE SODIUM PHOSPHATE 4 MG/ML IJ SOLN
INTRAMUSCULAR | Status: AC
  Filled 2014-10-12: qty 1

## 2014-10-12 MED ORDER — PROMETHAZINE HCL 25 MG/ML IJ SOLN: 6.2500 mg | INTRAMUSCULAR | Status: DC | PRN

## 2014-10-12 MED ORDER — HYDROMORPHONE HCL 1 MG/ML IJ SOLN: 0.2500 mg | INTRAMUSCULAR | Status: DC | PRN

## 2014-10-12 MED ORDER — ROCURONIUM BROMIDE 100 MG/10ML IV SOLN
INTRAVENOUS | Status: DC | PRN
  Administered 2014-10-12: 30 mg via INTRAVENOUS

## 2014-10-12 MED ORDER — ONDANSETRON HCL 4 MG/2ML IJ SOLN
INTRAMUSCULAR | Status: DC | PRN
  Administered 2014-10-12: 4 mg via INTRAVENOUS

## 2014-10-12 MED ORDER — ROCURONIUM BROMIDE 50 MG/5ML IV SOLN
INTRAVENOUS | Status: AC
  Filled 2014-10-12: qty 1

## 2014-10-12 MED ORDER — MIDAZOLAM HCL 5 MG/5ML IJ SOLN
INTRAMUSCULAR | Status: DC | PRN
  Administered 2014-10-12: 2 mg via INTRAVENOUS

## 2014-10-12 MED ORDER — EPHEDRINE SULFATE 50 MG/ML IJ SOLN
INTRAMUSCULAR | Status: DC | PRN
  Administered 2014-10-12 (×2): 5 mg via INTRAVENOUS

## 2014-10-12 MED ORDER — PROPOFOL 10 MG/ML IV BOLUS
INTRAVENOUS | Status: DC | PRN
  Administered 2014-10-12: 150 mg via INTRAVENOUS

## 2014-10-12 MED ORDER — DEXAMETHASONE SODIUM PHOSPHATE 4 MG/ML IJ SOLN
INTRAMUSCULAR | Status: DC | PRN
  Administered 2014-10-12: 4 mg via INTRAVENOUS

## 2014-10-12 MED ORDER — LIDOCAINE HCL (CARDIAC) 20 MG/ML IV SOLN
INTRAVENOUS | Status: AC
  Filled 2014-10-12: qty 5

## 2014-10-12 MED ORDER — NEOSTIGMINE METHYLSULFATE 10 MG/10ML IV SOLN
INTRAVENOUS | Status: DC | PRN
  Administered 2014-10-12: 4 mg via INTRAVENOUS

## 2014-10-12 MED ORDER — MIDAZOLAM HCL 2 MG/2ML IJ SOLN
INTRAMUSCULAR | Status: AC
  Filled 2014-10-12: qty 2

## 2014-10-12 MED ORDER — SCOPOLAMINE 1 MG/3DAYS TD PT72
1.0000 | MEDICATED_PATCH | TRANSDERMAL | Status: DC
  Filled 2014-10-12: qty 1

## 2014-10-12 MED ORDER — LACTATED RINGERS IV SOLN
INTRAVENOUS | Status: DC | PRN
  Administered 2014-10-12: 08:00:00 via INTRAVENOUS

## 2014-10-12 MED ORDER — GLYCOPYRROLATE 0.2 MG/ML IJ SOLN
INTRAMUSCULAR | Status: DC | PRN
  Administered 2014-10-12: 0.6 mg via INTRAVENOUS

## 2014-10-12 MED ORDER — SODIUM CHLORIDE 0.9 % IJ SOLN
INTRAMUSCULAR | Status: AC
  Filled 2014-10-12: qty 10

## 2014-10-12 SURGICAL SUPPLY — 24 items
BRUSH CYTOL CELLEBRITY 1.5X140 (MISCELLANEOUS) IMPLANT
CANISTER SUCTION 2500CC (MISCELLANEOUS) ×3 IMPLANT
CONT SPEC 4OZ CLIKSEAL STRL BL (MISCELLANEOUS) ×3 IMPLANT
COVER TABLE BACK 60X90 (DRAPES) ×3 IMPLANT
FILTER STRAW FLUID ASPIR (MISCELLANEOUS) IMPLANT
FORCEPS BIOP RJ4 1.8 (CUTTING FORCEPS) IMPLANT
GAUZE SPONGE 4X4 12PLY STRL (GAUZE/BANDAGES/DRESSINGS) ×3 IMPLANT
GLOVE SURG SS PI 6.5 STRL IVOR (GLOVE) ×3 IMPLANT
KIT CLEAN ENDO COMPLIANCE (KITS) ×3 IMPLANT
KIT ROOM TURNOVER OR (KITS) ×3 IMPLANT
MARKER SKIN DUAL TIP RULER LAB (MISCELLANEOUS) ×3 IMPLANT
NDL BIOPSY TRANSBRONCH 21G (NEEDLE) IMPLANT
NEEDLE BIOPSY TRANSBRONCH 21G (NEEDLE) IMPLANT
NEEDLE SYS SONOTIP II EBUSTBNA (NEEDLE) ×2 IMPLANT
NS IRRIG 1000ML POUR BTL (IV SOLUTION) ×3 IMPLANT
OIL SILICONE PENTAX (PARTS (SERVICE/REPAIRS)) ×3 IMPLANT
PAD ARMBOARD 7.5X6 YLW CONV (MISCELLANEOUS) ×6 IMPLANT
SYR 20CC LL (SYRINGE) ×3 IMPLANT
SYR 20ML ECCENTRIC (SYRINGE) ×6 IMPLANT
SYR 5ML LUER SLIP (SYRINGE) ×3 IMPLANT
TOWEL OR 17X24 6PK STRL BLUE (TOWEL DISPOSABLE) ×3 IMPLANT
TRAP SPECIMEN MUCOUS 40CC (MISCELLANEOUS) ×3 IMPLANT
TUBE CONNECTING 20'X1/4 (TUBING) ×1
TUBE CONNECTING 20X1/4 (TUBING) ×2 IMPLANT

## 2014-10-12 NOTE — Discharge Instructions (Signed)
Please have someone to drive you home  Please be careful with activities for next 24 hours  You can eat 2-4 hours after getting home provided you are fully alert, able to cough, and are not nauseated or vomiting and     feel well  You are expected to have low grade fever or cough some amount of blood for next 24-48 hours; if this worsens call us  IF you are very short of breath or coughing blood or chest pain or not feeling well, call us [phone] anytime or go to emergency room  Future Appointments Date Time Provider [place]  10/17/2014 3:30 PM Melvenia Needles, NP [facility] None  10/24/2014 2:30 PM [facility] LAB 1 [facility] None  10/24/2014 3:00 PM [facility] COVERING PROVIDER 1 [facility] None  12/06/2014 2:30 PM [facility] LAB 5 [facility] None  12/06/2014 3:00 PM [facility] COVERING PROVIDER 1 [facility] None  12/27/2014 9:00 AM Unk Pinto, MD [facility] None   What to eat:  For your first meals, you should eat lightly; only small meals initially.  If you do not have nausea, you may eat larger meals.  Avoid spicy, greasy and heavy food.    General Anesthesia, Adult, Care After  Refer to this sheet in the next few weeks. These instructions provide you with information on caring for yourself after your procedure. Your health care provider may also give you more specific instructions. Your treatment has been planned according to current medical practices, but problems sometimes occur. Call your health care provider if you have any problems or questions after your procedure.  WHAT TO EXPECT AFTER THE PROCEDURE  After the procedure, it is typical to experience:  Sleepiness.  Nausea and vomiting. HOME CARE INSTRUCTIONS  For the first 24 hours after general anesthesia:  Have a responsible person with you.  Do not drive a car. If you are alone, do not take public transportation.  Do not drink alcohol.  Do not take medicine that has not been prescribed by your health  care provider.  Do not sign important papers or make important decisions.  You may resume a normal diet and activities as directed by your health care provider.  Change bandages (dressings) as directed.  If you have questions or problems that seem related to general anesthesia, call the hospital and ask for the anesthetist or anesthesiologist on call. SEEK MEDICAL CARE IF:  You have nausea and vomiting that continue the day after anesthesia.  You develop a rash. SEEK IMMEDIATE MEDICAL CARE IF:  You have difficulty breathing.  You have chest pain.  You have any allergic problems. Document Released: 02/10/2001 Document Revised: 07/07/2013 Document Reviewed: 05/20/2013  [facility] Patient Information 2014 [place], [place].

## 2014-10-12 NOTE — Interval H&P Note (Signed)
History and Physical Interval Note:  10/12/2014 8:09 AM  Courtney Stevens  has presented today for surgery, with the diagnosis of MEDIASTINAL ADENOPATHY.  The various methods of treatment have been discussed with the patient and family. After consideration of risks, benefits and other options for treatment, the patient has consented to  Procedure(s): [place] (N/A) as a surgical intervention .  The patient's history has been reviewed, patient examined, no change in status, stable for surgery.  I have reviewed the patient's chart and labs.  Questions were answered to the patient's satisfaction.     Risks of pneumothorax, hemothorax, sedation/anesthesia complications such as cardiac or respiratory arrest or hypotension, stroke and bleeding all explained. Benefits of diagnosis but limitations of non-diagnosis also explained. Patient verbalized understanding and wished to proceed.     Labs reveiwed Vitals reviewed Filed Vitals:   10/12/14 0654 10/12/14 0700  BP: 139/74   Pulse: 63   Temp:  98 F (36.7 C)  Resp: 18   SpO2: 100%     EXam - within normal limits    Dr. Brand Males, M.D., [facility].C.P Pulmonary and Critical Care Medicine Staff Physician [place] [facility] Pager: [phone], If no answer or between  15:00h - 7:00h: call [phone]  10/12/2014 8:10 AM

## 2014-10-12 NOTE — Anesthesia Procedure Notes (Signed)
Procedure Name: Intubation Date/Time: 10/12/2014 8:25 AM Performed by: Carola Frost Pre-anesthesia Checklist: Patient identified, Timeout performed, Emergency Drugs available, Suction available and Patient being monitored Patient Re-evaluated:Patient Re-evaluated prior to inductionOxygen Delivery Method: Circle system utilized Preoxygenation: Pre-oxygenation with 100% oxygen Intubation Type: IV induction Ventilation: Mask ventilation without difficulty Laryngoscope Size: Miller and 2 Grade View: Grade II Tube type: Oral Tube size: 7.5 mm Number of attempts: 1 Airway Equipment and Method: Stylet Placement Confirmation: CO2 detector,  positive ETCO2,  ETT inserted through vocal cords under direct vision and breath sounds checked- equal and bilateral Secured at: 21 cm Tube secured with: Tape Dental Injury: Teeth and Oropharynx as per pre-operative assessment

## 2014-10-12 NOTE — Anesthesia Postprocedure Evaluation (Signed)
Anesthesia Post Note  Patient: Courtney Stevens  Procedure(s) Performed: Procedure(s) (LRB): VIDEO BRONCHOSCOPY WITH ENDOBRONCHIAL ULTRASOUND (N/A)  Anesthesia type: general  Patient location: PACU  Post pain: Pain level controlled  Post assessment: Patient's Cardiovascular Status Stable  Last Vitals:  Filed Vitals:   10/12/14 1017  BP: 135/63  Pulse: 68  Temp:   Resp: 15    Post vital signs: Reviewed and stable  Level of consciousness: sedated  Complications: No apparent anesthesia complications

## 2014-10-12 NOTE — H&P (View-Only) (Signed)
Subjective:    Patient ID: Courtney Stevens, female    DOB: 1956-05-12, 58 y.o.   MRN: [id number] PCP Unk Pinto DAVID, MD Onc: DR Lona Kettle  HPI  IOV 09/27/2014  Chief Complaint  Patient presents with  . Follow-up    Pt here to follow up before EBUS. Pt c/o mild dry cough. Pt denies SOB and CP/tightness.    Hx from Patient, [facility] Notes 07/06/14 and local onc Dr Lona Kettle notes 09/05/14   In 2004 - she is s/p  Gastric bypass surgery without complications In 6387 -  Diagnosed with cholangiocarcinima  PT95m, N1, MO (involed peri-dutal soft tissue, duodenal wall and pancreas and 4/18 regional nodes) of distal CBC and s/p whipple  At [facility] followed by chemo 5FU infusion and XRT In 2010 - she had increased CA19-9 and s/p chemo with gemcitabine and taxotered  Then well till end 2014  In End 2014 - developed FUUO  In Jan  And Feb 2015 - hospitalized at [facility]  X 2- MRI showed Liver Mass Right hepatic lobe and upper abd adenopathy. She was positive antibody for Q fever and Rx with prolonged doxy. 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 Otherwise workup was negative incuding CT sinus in March 2015, Echo,During this time also got prednisone for 1 week apparently  In April 2015 - saw Dr Dorothyann Peng at [facility]. Repat Q Fever antibody negatie and felt not have had Q Fever or other autoimmune disease. Did get short courses of pred x 2 (april and may 2015) for ? Spider bite ? [facility] 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 She at [facility] has ACE level 77 and C-ANCA at 1:320 but of these C-ANCA might have been negative at [facility] on repeat  In Sept 2014: hosspitalized at [facility] on 08/02/2014 with complaints of abdominal pain. 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 There was also an increase in the gastrohepatic ligament adenopathy. Her case was discussed with the interventional radiology and they were not able to biopsy the lymph node because it is located in an area the biopsy would have to go through the last time and the liver. Therefore, a surveillance strategy was recommended that is to do another scan in a month. She was discharged on ciprofloxacin for 10 days and there was an improvement in her abdominal pain   In Oct 2015: at followup   her tumor marker of CA19-9 continued to be NORMAL. However, PET SCAN 1. 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  borderline enlarged low right paraesophageal lymph node which demonstrates some low-level metabolic activity (suspicious for its size), as well as a mildly enlarged subcarinal lymph node which demonstrates low-level hypermetabolism, both of which are concerning for potential metastatic disease.   Nov 2015  -  Patient was discussed at MDT rounds: and because of some uptake in enlarged have STation 7 Low uptake pet note and others in liver. MDT rounds felt this was best approached first with EBUS bronch and therefore referred to pulmonary.  - She does not endorse major pulmonary complaints     has a past medical history of Cancer; GERD (gastroesophageal reflux disease); Diabetes mellitus; Hypertension; Hypothyroid; Hyperlipidemia; Asthma; Hepatitis; Heart murmur; Anxiety; and Anemia.   has past surgical history that includes Gastric bypass (2004);  Cholecystectomy (1992); Bile Duct Resection (2009); Appendectomy; Tonsillectomy; Abdominal hysterectomy (1990); Tubal ligation; Breast surgery; and Bunionectomy (Right, 2005).  Allergies  Allergen Reactions  . Ace Inhibitors Anaphylaxis  . Tilade [Nedocromil] Anaphylaxis  . Reglan [Metoclopramide] Nausea And Vomiting  . Betadine [Povidone Iodine] Rash    iching     Immunization History  Administered Date(s) Administered  . Influenza Split 09/27/2014  . Influenza Whole 08/18/2012  . Influenza-Unspecified 08/31/2013  . Pneumococcal Polysaccharide-23 11/18/2008  . Pneumococcal-Unspecified 06/14/1999  . Tdap 03/24/2014  . Zoster 03/11/2012   Family History  Problem Relation Age of Onset  . Colon cancer Mother   . Hypertension Mother   . Glaucoma Mother   . Diabetes Father   . Alzheimer's disease Father   . Heart attack Father   . Nephrolithiasis Father   . Emphysema Father     was a smoker  . Heart disease Father   . Nephrolithiasis Sister   . Heart attack Brother   . Nephrolithiasis Brother       SOCIAL HX She has been married for 36 years, and has 2 adult children. 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 She enjoys crafts. She is not engaged in any regular exercise. She eats a normal diet. Daughter Museum/gallery conservator works in Physiological scientist   Review of Systems See RN - ROS above         Current outpatient prescriptions: ALPRAZolam (XANAX) 1 MG tablet, TAKE 1/2 TO 1 TABLET BY MOUTH UP TO 3 TIMES A DAY AS NEEDED, Disp: 90 tablet, Rfl: 1;  aspirin 81 MG tablet, Take 81 mg by mouth daily., Disp: , Rfl: ;  calcium citrate-vitamin D (CITRACAL+D) 315-200 MG-UNIT per tablet, Take 1 tablet by mouth 3 (three) times daily., Disp: , Rfl: ;  cetirizine (ZYRTEC) 10 MG tablet, Take 10 mg by mouth every morning.  , Disp: , Rfl:  CREON 12000 UNITS CPEP capsule, TAKE ONE CAPSULE BY MOUTH 3 TIMES A DAY, Disp: 270 capsule, Rfl: 99;  ferrous sulfate 325 (65 FE) MG tablet, Take  325 mg by mouth daily with breakfast., Disp: , Rfl: ;  furosemide (LASIX) 40 MG tablet, Take 1 tablet (40 mg total) by mouth daily., Disp: 30 tablet, Rfl: 0 HYDROcodone-acetaminophen (NORCO/VICODIN) 5-325 MG per tablet, Take 1 tablet by mouth every 4 (four) hours as needed for moderate pain., Disp: 30 tablet, Rfl: 0;  levothyroxine (SYNTHROID, LEVOTHROID) 200 MCG tablet, Take 100-200 mcg by mouth daily before breakfast. Alternate taking 100 mg and 200 mg, Disp: , Rfl: ;  Magnesium 250 MG TABS, Take 1 tablet by mouth 4 (four) times daily. , Disp: , Rfl:  metFORMIN (GLUCOPHAGE) 500 MG tablet, Take 1,000 mg by mouth 2 (two) times daily with a meal., Disp: , Rfl: ;  Multiple Vitamins-Minerals (MULTIVITAMIN WITH MINERALS) tablet, Take 1 tablet by mouth daily. , Disp: , Rfl: ;  omeprazole (PRILOSEC) 40 MG capsule, Take 40 mg by mouth 2 (two) times daily., Disp: ,  Rfl: ;  potassium chloride SA (K-DUR,KLOR-CON) 20 MEQ tablet, Take 20 mEq by mouth 2 (two) times daily. , Disp: , Rfl:  promethazine-dextromethorphan (PROMETHAZINE-DM) 6.25-15 MG/5ML syrup, Take 1 to 2 tsp 4 x day or every 4 hs if needed for cough, Disp: 240 mL, Rfl: 1;  Vitamin D, Ergocalciferol, (DRISDOL) 50000 UNITS CAPS capsule, Take 50,000 Units by mouth daily., Disp: , Rfl:    Objective:   Physical Exam  Constitutional: She is oriented to person, place, and time. She appears well-developed and well-nourished. No distress.  HENT:  Head: Normocephalic and atraumatic.  Right Ear: External ear normal.  Left Ear: External ear normal.  Mouth/Throat: Oropharynx is clear and moist. No oropharyngeal exudate.  Eyes: Conjunctivae and EOM are normal. Pupils are equal, round, and reactive to light. Right eye exhibits no discharge. Left eye exhibits no discharge. No scleral icterus.  Neck: Normal range of motion. Neck supple. No JVD present. No tracheal deviation present. No thyromegaly present.  Cardiovascular: Normal rate, regular rhythm, normal heart  sounds and intact distal pulses.  Exam reveals no gallop and no friction rub.   No murmur heard. Pulmonary/Chest: Effort normal and breath sounds normal. No respiratory distress. She has no wheezes. She has no rales. She exhibits no tenderness.  Abdominal: Soft. Bowel sounds are normal. She exhibits no distension and no mass. There is no tenderness. There is no rebound and no guarding.  Musculoskeletal: Normal range of motion. She exhibits no edema or tenderness.  Lymphadenopathy:    She has no cervical adenopathy.  Neurological: She is alert and oriented to person, place, and time. She has normal reflexes. No cranial nerve deficit. She exhibits normal muscle tone. Coordination normal.  Skin: Skin is warm and dry. No rash noted. She is not diaphoretic. No erythema. No pallor.  Psychiatric: She has a normal mood and affect. Her behavior is normal. Judgment and thought content normal.  Vitals reviewed.   Filed Vitals:   09/27/14 1408  BP: 126/78  Pulse: 76  Height: 5\' 7"  (1.702 m)  Weight: 165 lb (74.844 kg)  SpO2: 98%          Assessment & Plan:  MEdiastinal NOde  DDx - chronic non specific inflammation, granuloma from sarcoid or other causes, lymphoma. IT appears that diagnosis is uncertain with lvier biopsy showing granuloma earlier this year. Q fever was considered btu ruled out. She has some other abdominal nodes that are PET HOT but are techniically dificult to reach. PET scan also shows Station 7 Sub-carinal node that is somehwat pet hot. IN MDT meeting it was decided that this be biopsied first though there is limitation of non-diagnosis. She understand and agrees with this  - Arrange for EBUS  Risks of pneumothorax, hemothorax, sedation/anesthesia complications such as cardiac or respiratory arrest or hypotension, stroke and bleeding all explained. Benefits of diagnosis but limitations of non-diagnosis also explained. Patient verbalized understanding and wished to proceed.    Will get sample for flow   Dr. Brand Males, M.D., [facility]C.P Pulmonary and Critical Care Medicine Staff Physician [place] [facility] Pager: [phone], If no answer or between  15:00h - 7:00h: call [phone]  09/27/2014 2:36 PM

## 2014-10-12 NOTE — Op Note (Signed)
Name:  Courtney Stevens MRN:  [id number] DOB:  12-Sep-1956  PROCEDURE NOTE  Procedure(s): Flexible bronchoscopy [phone]) Endobronchial ultrasound ([id number]) Transbronchial needle aspiration ([id number]) of the STATION 7 SUBCARINAL NODE   Indications:  Hilar / mediastinal lymphadenopathy.  Consent:  Procedure, benefits, risks and alternatives discussed.  Questions answered.  Consent obtained.  Anesthesia:  General endotracheal.  Procedure summary:  Appropriate equipment was assembled.  The patient was brought to the operating room and identified as [name] DOB Jul 17, 1956 .  Safety timeout was performed. The patient was placed supine on the operating table, airway established and general anesthesia administered by Anesthesia team.   After the appropriate level of anesthesia was assured, flexible video bronchoscope was lubricated and inserted through the endotracheal tube.    Airway examination was performed bilaterally to subsegmental level.  Minimal clear secretions were noted, mucosa appeared normal and no endobronchial lesions were identified.  Endobronchial ultrasound video bronchoscope was then lubricated and inserted through the endotracheal tube. Surveillance of the mediastinal and and bilateral hilar lymph node stations was performed.  Pathologically enlarged lymph nodes were noted. Only station 7 subcarinal node was visualized. It was small  Endobronchial ultrasound guided transbronchial needle aspiration of station 7 subcarinal node (passes x 3 for slide),  (passes x 5 for flow cytometry) and  (passes x 2 for cytolyte) was performed, after which EBUS bronchoscope was withdrawn.  The patient was extubated in operating room and transferred to PACU. Post-procedure chest x-ray was not indicated Specimens sent: TBNA station 7 node for   - slide on site - lymphocytes seen - non diagnostic  - flow cytolmetry  - cytology   Complications:  No immediate complications were noted.   Hemodynamic parameters and oxygenation remained stable throughout the procedure.  Estimated blood loss:  Less then 0.5 mL.  Dr. Brand Males, M.D., [facility].C.P Pulmonary and Critical Care Medicine Staff Physician [place] [facility] Critical [facility] Pager: [phone], If no answer or between  15:00h - 7:00h: call [phone]  10/12/2014 9:31 AM

## 2014-10-12 NOTE — Transfer of Care (Signed)
Immediate Anesthesia Transfer of Care Note  Patient: Courtney Stevens  Procedure(s) Performed: Procedure(s): VIDEO BRONCHOSCOPY WITH ENDOBRONCHIAL ULTRASOUND (N/A)  Patient Location: PACU  Anesthesia Type:General  Level of Consciousness: awake, alert  and oriented  Airway & Oxygen Therapy: Patient Spontanous Breathing and Patient connected to nasal cannula oxygen  Post-op Assessment: Report given to PACU RN, Post -op Vital signs reviewed and stable and Patient moving all extremities X 4  Post vital signs: Reviewed and stable  Complications: No apparent anesthesia complications

## 2014-10-12 NOTE — Anesthesia Preprocedure Evaluation (Addendum)
Anesthesia Evaluation  Patient identified by MRN, date of birth, ID band Patient awake    Reviewed: Allergy & Precautions, H&P , NPO status , Patient's Chart, lab work & pertinent test results  History of Anesthesia Complications Negative for: history of anesthetic complications  Airway Mallampati: II  TM Distance: >3 FB Neck ROM: Full    Dental  (+) Teeth Intact, Dental Advisory Given   Pulmonary asthma , former smoker,    Pulmonary exam normal       Cardiovascular hypertension, Pt. on medications     Neuro/Psych Anxiety negative neurological ROS     GI/Hepatic GERD-  Medicated,  Endo/Other  diabetesHypothyroidism   Renal/GU Renal InsufficiencyRenal disease     Musculoskeletal   Abdominal   Peds  Hematology   Anesthesia Other Findings   Reproductive/Obstetrics                            Anesthesia Physical Anesthesia Plan  ASA: III  Anesthesia Plan: General   Post-op Pain Management:    Induction: Intravenous  Airway Management Planned: Oral ETT  Additional Equipment:   Intra-op Plan:   Post-operative Plan: Extubation in OR  Informed Consent: I have reviewed the patients History and Physical, chart, labs and discussed the procedure including the risks, benefits and alternatives for the proposed anesthesia with the patient or authorized representative who has indicated his/her understanding and acceptance.   Dental advisory given  Plan Discussed with: CRNA, Anesthesiologist and Surgeon  Anesthesia Plan Comments:        Anesthesia Quick Evaluation

## 2014-10-17 ENCOUNTER — Encounter (HOSPITAL_COMMUNITY): Payer: Self-pay | Admitting: Internal Medicine

## 2014-10-17 ENCOUNTER — Ambulatory Visit (INDEPENDENT_AMBULATORY_CARE_PROVIDER_SITE_OTHER): Payer: BC Managed Care – PPO | Admitting: Adult Health

## 2014-10-17 ENCOUNTER — Telehealth: Payer: Self-pay | Admitting: Hematology

## 2014-10-17 ENCOUNTER — Telehealth: Payer: Self-pay | Admitting: Internal Medicine

## 2014-10-17 VITALS — BP 108/72 | HR 80 | Temp 98.2°F | Ht 67.0 in | Wt 166.2 lb

## 2014-10-17 DIAGNOSIS — R599 Enlarged lymph nodes, unspecified: Secondary | ICD-10-CM

## 2014-10-17 DIAGNOSIS — R590 Localized enlarged lymph nodes: Secondary | ICD-10-CM

## 2014-10-17 NOTE — Telephone Encounter (Signed)
appt for 12/7 cxd due to old appt. pt last seen 10/19 and was to f/u in 9mos - jan 2016. appt for 12/06/14 moved from CP1 to YF. lmonvm for pt re changes and mailed new schedule.

## 2014-10-17 NOTE — Patient Instructions (Signed)
Please follow up with Oncology  Call if you need records if you decide to go to [place] .  Follow up with Dr. Chase Caller in 6-8 weeks and As needed

## 2014-10-17 NOTE — Progress Notes (Signed)
Subjective:    Patient ID: Courtney Stevens, female    DOB: 10-05-1956, 58 y.o.   MRN: [id number] PCP Unk Pinto DAVID, MD Onc: DR Lona Kettle  HPI  IOV 09/27/2014  Chief Complaint  Patient presents with  . Follow-up    Pt here to follow up before EBUS. Pt c/o mild dry cough. Pt denies SOB and CP/tightness.    Hx from Patient, [facility] Notes 07/06/14 and local onc Dr Lona Kettle notes 09/05/14   In 2004 - she is s/p  Gastric bypass surgery without complications In 3295 -  Diagnosed with cholangiocarcinima  PT17m, N1, MO (involed peri-dutal soft tissue, duodenal wall and pancreas and 4/18 regional nodes) of distal CBC and s/p whipple  At [facility] followed by chemo 5FU infusion and XRT In 2010 - she had increased CA19-9 and s/p chemo with gemcitabine and taxotered  Then well till end 2014  In End 2014 - developed FUUO  In Jan  And Feb 2015 - hospitalized at [facility] long  X 2- MRI showed Liver Mass Right hepatic lobe and upper abd adenopathy. She was positive antibody for Q fever and Rx with prolonged doxy. 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 Otherwise workup was negative incuding CT sinus in March 2015, Echo,During this time also got prednisone for 1 week apparently  In April 2015 - saw Dr Dorothyann Peng at [facility]. Repat Q Fever antibody negatie and felt not have had Q Fever or other autoimmune disease. Did get short courses of pred x 2 (april and may 2015) for ? [facility] ? [facility] Is possible that Dr at [facility] might be leaning towards idiopathic granulomatosos of liver as diagnosis    In Aug 2015: SHe reported at [facility] no further fevers since May 2015. She at [facility] has ACE level 77 and C-ANCA at 1:320 but of these C-ANCA might have been negative at [facility] on repeat  In Sept 2014: hosspitalized at [facility] on 08/02/2014 with complaints of abdominal pain. 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 There was also an increase in the gastrohepatic ligament adenopathy. Her case was discussed with the interventional radiology and they were not able to biopsy the lymph node because it is located in an area the biopsy would have to go through the last time and the liver. Therefore, a surveillance strategy was recommended that is to do another scan in a month. She was discharged on ciprofloxacin for 10 days and there was an improvement in her abdominal pain   In Oct 2015: at followup   her tumor marker of CA19-9 continued to be NORMAL. However, PET SCAN 1. 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  borderline enlarged low right paraesophageal lymph node which demonstrates some low-level metabolic activity (suspicious for its size), as well as a mildly enlarged subcarinal lymph node which demonstrates low-level hypermetabolism, both of which are concerning for potential metastatic disease.   Nov 2015  -  Patient was discussed at MDT rounds: and because of some uptake in enlarged have STation 7 Low uptake pet note and others in liver. MDT rounds felt this was best approached first with EBUS bronch and therefore referred to pulmonary.  - She does not endorse major pulmonary complaints  10/17/2014 Follow up /Test results  Pt returns to discuss test results.  She has hx of cholangiocarcinoma 2009 Admitted Jan/Feb 2015 with liver mass , bx showed chronic granuloma. ACE level was 77.  She is followed by [facility] Rheumatology. There was concern for  possible Wegener's Granulomatosis w/ initial ANCA+ . Repeat ANCA was neg.  Most recent scans of abd showed gastrohepatic adenopathy Subsequent PET scan showed extensive hypermetabolic act in  central masslike area in the liver. There was an enlarged subcarinal node and was referred to pulmonary for consideration of EBUS.  Pt underwent EBUS on 10/12/14 of the subcarinal node.  Cytology was neg for malignant cells.  We discussed these results and the need for more workup for liver mass.   She is somewhat frustrated that she has had 7 oncologist since 2009. She is considering changing to Oncology at [facility] . She is seen by rheumatology and ID there .  She has spoken to ONcology at [facility] .   She denies any chest pain, hemoptysis, orthopnea, PND, leg swelling.  ROS See HPI      Objective:   EXAM:  GEN: A/Ox3; pleasant , NAD, well nourished   HEENT:  [place]/AT,  EACs-clear, TMs-wnl, NOSE-clear, THROAT-clear, no lesions, no postnasal drip or exudate noted.   NECK:  Supple w/ fair ROM; no JVD; normal carotid impulses w/o bruits; no thyromegaly or nodules palpated; no lymphadenopathy.  RESP  Clear  P & A; w/o, wheezes/ rales/ or rhonchi.no accessory muscle use, no dullness to percussion  CARD:  RRR, no m/r/g  , no peripheral edema, pulses intact, no cyanosis or clubbing.  GI:   Soft & nt; nml bowel sounds; no organomegaly or masses detected.  Musco: Warm bil, no deformities or joint swelling noted.   Neuro: alert, no focal deficits noted.    Skin: Warm, no lesions or rashes

## 2014-10-17 NOTE — Telephone Encounter (Signed)
Tammy   YOu are seeing Courtney Stevens 10/17/2014 . EBUS was non-diagnostic. Very small and not so pet active node. So not unexpected   She is somewhat complicated. 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 You can mention this  I can see her back once in few months  THanks  Dr. Brand Males, M.D., [facility].C.P Pulmonary and Critical Care Medicine Staff Physician [place] [facility] Pager: [phone], If no answer or between  15:00h - 7:00h: call [phone]  10/17/2014 8:57 AM

## 2014-10-18 NOTE — Assessment & Plan Note (Signed)
She has hx of cholangiocarcinoma 2009, now w/ liver mass  S/p  bx showed chronic granuloma. ACE level was 77.  Workup has been unrevealing  She is followed by [facility] Rheumatology. There was concern for possible Wegener's Granulomatosis w/ initial ANCA+ . Repeat ANCA was neg.  Most recent scans of abd showed gastrohepatic adenopathy Subsequent PET scan showed extensive hypermetabolic act in  central masslike area in the liver. There was an enlarged subcarinal node and was referred to pulmonary for consideration of EBUS.  Pt underwent EBUS on 10/12/14 of the subcarinal node.  Cytology was neg for malignant cells.  In the differential is possible Sarcoid w/ elevated ACE and granuloma on previous liver bx.  However do not want to commit her to steroids if dx is uncertain.  One option would be to tx w/ steroids and repeat CT abd to see if liver lesion decreased  She needs follow up with oncology however is deciding to change her care to [place] her to call back once decision made  Case reviewed with Dr. Chase Caller in detail.

## 2014-10-21 ENCOUNTER — Institutional Professional Consult (permissible substitution): Payer: BC Managed Care – PPO | Admitting: Internal Medicine

## 2014-10-24 ENCOUNTER — Ambulatory Visit: Payer: BC Managed Care – PPO

## 2014-10-24 ENCOUNTER — Other Ambulatory Visit: Payer: BC Managed Care – PPO

## 2014-10-31 ENCOUNTER — Other Ambulatory Visit: Payer: Self-pay | Admitting: *Deleted

## 2014-10-31 MED ORDER — ALPRAZOLAM 1 MG PO TABS: ORAL_TABLET | ORAL | Status: DC

## 2014-10-31 MED ORDER — PROMETHAZINE-DM 6.25-15 MG/5ML PO SYRP: ORAL_SOLUTION | ORAL | Status: DC

## 2014-11-17 ENCOUNTER — Telehealth: Payer: Self-pay | Admitting: Hematology

## 2014-11-17 NOTE — Telephone Encounter (Signed)
Pt called and canceled all appt with [place] due to finding a new doctor. Pt stated she did not like that she has seen three different doctors in the last few month

## 2014-11-19 ENCOUNTER — Other Ambulatory Visit: Payer: Self-pay | Admitting: Internal Medicine

## 2014-11-19 MED ORDER — CIPROFLOXACIN HCL 250 MG PO TABS: 250.0000 mg | ORAL_TABLET | Freq: Two times a day (BID) | ORAL | Status: AC

## 2014-12-01 ENCOUNTER — Encounter: Payer: Self-pay | Admitting: Internal Medicine

## 2014-12-01 ENCOUNTER — Ambulatory Visit (INDEPENDENT_AMBULATORY_CARE_PROVIDER_SITE_OTHER): Payer: BLUE CROSS/BLUE SHIELD | Admitting: Internal Medicine

## 2014-12-01 VITALS — BP 110/82 | HR 94 | Ht 67.0 in | Wt 163.0 lb

## 2014-12-01 DIAGNOSIS — R059 Cough, unspecified: Secondary | ICD-10-CM

## 2014-12-01 DIAGNOSIS — R05 Cough: Secondary | ICD-10-CM

## 2014-12-01 DIAGNOSIS — R1011 Right upper quadrant pain: Secondary | ICD-10-CM

## 2014-12-01 MED ORDER — FLUTICASONE PROPIONATE 50 MCG/ACT NA SUSP: 2.0000 | Freq: Every day | NASAL | Status: DC

## 2014-12-01 NOTE — Progress Notes (Signed)
Subjective:    Patient ID: Courtney Stevens, female    DOB: April 25, 1956, 59 y.o.   MRN: [id number]  HPI    PCP MCKEOWN,WILLIAM DAVID, MD Onc: DR Lona Kettle  HPI  IOV 09/27/2014  Chief Complaint  Patient presents with  . Follow-up    Pt here to follow up before EBUS. Pt c/o mild dry cough. Pt denies SOB and CP/tightness.    Hx from Patient, [facility] Notes 07/06/14 and local onc Dr Lona Kettle notes 09/05/14   In 2004 - she is s/p  Gastric bypass surgery without complications In 9371 -  Diagnosed with cholangiocarcinima  PT72m, N1, MO (involed peri-dutal soft tissue, duodenal wall and pancreas and 4/18 regional nodes) of distal CBC and s/p whipple  At [facility] followed by chemo 5FU infusion and XRT In 2010 - she had increased CA19-9 and s/p chemo with gemcitabine and taxotered  Then well till end 2014  In End 2014 - developed FUUO  In Jan  And Feb 2015 - hospitalized at [facility] long  X 2- MRI showed Liver Mass Right hepatic lobe and upper abd adenopathy. She was positive antibody for Q fever and Rx with prolonged doxy. 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 Otherwise workup was negative incuding CT sinus in March 2015, Echo,During this time also got prednisone for 1 week apparently  In April 2015 - saw Dr Dorothyann Peng at [facility]. Repat Q Fever antibody negatie and felt not have had Q Fever or other autoimmune disease. Did get short courses of pred x 2 (april and may 2015) for ? [facility] bite ? [facility]. Is possible that Dr at [facility] might be leaning towards idiopathic granulomatosos of liver as diagnosis    In Aug 2015: SHe reported at [facility] no further fevers since May 2015. She at [facility] has ACE level 77 and C-ANCA at 1:320 but of these C-ANCA might have been negative at [facility] on repeat  In Sept 2014: hosspitalized at [place] [facility] on 08/02/2014 with complaints of abdominal pain. In the ER she had a CAT scan of the chest abdomen and pelvis performed showing new portacaval adenopathy  causing extrinsic compression of the portal vein in addition to an increase in size of nodular implant adjacent to the right hepatic lobe. There was also an increase in the gastrohepatic ligament adenopathy. Her case was discussed with the interventional radiology and they were not able to biopsy the lymph node because it is located in an area the biopsy would have to go through the last time and the liver. Therefore, a surveillance strategy was recommended that is to do another scan in a month. She was discharged on ciprofloxacin for 10 days and there was an improvement in her abdominal pain   In Oct 2015: at followup   her tumor marker of CA19-9 continued to be NORMAL. However, PET SCAN 1. 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  borderline enlarged low right paraesophageal lymph node which demonstrates some low-level metabolic activity (suspicious for its size), as well as a mildly enlarged subcarinal lymph node which demonstrates low-level hypermetabolism, both of which are concerning for potential metastatic  disease.   Nov 2015  - Patient was discussed at MDT rounds: and because of some uptake in enlarged have STation 7 Low uptake pet note and others in liver. MDT rounds felt this was best approached first with EBUS bronch and therefore referred to pulmonary.  - She does not endorse major pulmonary complaints  10/17/2014 Follow up /Test results  Pt returns to discuss test results.  She has hx of cholangiocarcinoma 2009 Admitted Jan/Feb 2015 with liver mass , bx showed chronic granuloma. ACE level was 77.  She is followed by [facility] Rheumatology. There was  concern for possible Wegener's Granulomatosis w/ initial ANCA+ . Repeat ANCA was neg.  Most recent scans of abd showed gastrohepatic adenopathy Subsequent PET scan showed extensive hypermetabolic act in  central masslike area in the liver. There was an enlarged subcarinal node and was referred to pulmonary for consideration of EBUS.  Pt underwent EBUS on 10/12/14 of the subcarinal node.  Cytology was neg for malignant cells.  We discussed these results and the need for more workup for liver mass.   She is somewhat frustrated that she has had 7 oncologist since 2009. She is considering changing to Oncology at [facility] . She is seen by rheumatology and ID there .  She has spoken to ONcology at [facility] .   She denies any chest pain, hem   OV 12/01/2014  Chief Complaint  Patient presents with  . Follow-up    Pt followed up with [facility] oncology. Pt c/o SOB with exertion, dry cough. Pt denies CP/tightness.    Follow-up patient with mild PET uptake subcarinal node with significant PET uptake nondiagnostic liver lesion with a previous history of cholangiocarcinoma.  Since her last visit when her ebus subcarinal node biopsy was nondiagnostic she has seen [facility] rheumatology. I reviewed the notes from 11/06/2014 and it appears that granulomatous vasculitis is been ruled out because the repeat Anka test has been negative. She's now been referred to [facility] oncology. The consensus view appears to be that they do want to biopsy her liver lesion but they considered risky and the plantar discussed with the tumor board and get back to her. She is overall doing well except 10 days ago she woke up all of a sudden at night with a heavy bout of coughing that lasted 15 minutes. Since then she's had some right upper quadrant pain that seems to be a catch and can get worse with sneezing or coughing. She continues to have a cough that is mild since then. It is improved but she still gets it periodically at night only. Cough is not  present at daytime except occasionally. There is some sneezing in the daytime. She does have history of acid reflux but she does not take her Prilosec on empty stomach. She does not think acid reflux is active. She denies any sinus drainage. Denies wheezing, fever, chills   Review of Systems  Constitutional: Negative for fever and unexpected weight change.  HENT: Negative for congestion, dental problem, ear pain, nosebleeds, postnasal drip, rhinorrhea, sinus pressure, sneezing, sore throat and trouble swallowing.   Eyes: Negative for redness and itching.  Respiratory: Positive for cough and shortness of breath. Negative for chest tightness and wheezing.   Cardiovascular: Negative for palpitations and leg swelling.  Gastrointestinal: Negative for nausea and vomiting.  Genitourinary: Negative for dysuria.  Musculoskeletal: Negative for joint swelling.  Skin: Negative for rash.  Neurological: Negative for headaches.  Hematological: Does not bruise/bleed easily.  Psychiatric/Behavioral: Negative for  dysphoric mood. The patient is not nervous/anxious.        Objective:   Physical Exam  Constitutional: She is oriented to person, place, and time. She appears well-developed and well-nourished. No distress.  Mild deconditioned looking  HENT:  Head: Normocephalic and atraumatic.  Right Ear: External ear normal.  Left Ear: External ear normal.  Mouth/Throat: Oropharynx is clear and moist. No oropharyngeal exudate.  Post nasal drip  Eyes: Conjunctivae and EOM are normal. Pupils are equal, round, and reactive to light. Right eye exhibits no discharge. Left eye exhibits no discharge. No scleral icterus.  Neck: Normal range of motion. Neck supple. No JVD present. No tracheal deviation present. No thyromegaly present.  Cardiovascular: Normal rate, regular rhythm, normal heart sounds and intact distal pulses.  Exam reveals no gallop and no friction rub.   No murmur heard. Pulmonary/Chest: Effort  normal and breath sounds normal. No respiratory distress. She has no wheezes. She has no rales. She exhibits no tenderness.  Abdominal: Soft. Bowel sounds are normal. She exhibits no distension and no mass. There is no tenderness. There is no rebound and no guarding.  Tender in RUQ musculature. But no mass. Scar from surgery +  Musculoskeletal: Normal range of motion. She exhibits no edema or tenderness.  Lymphadenopathy:    She has no cervical adenopathy.  Neurological: She is alert and oriented to person, place, and time. She has normal reflexes. No cranial nerve deficit. She exhibits normal muscle tone. Coordination normal.  Skin: Skin is warm and dry. No rash noted. She is not diaphoretic. No erythema. No pallor.  Psychiatric: She has a normal mood and affect. Her behavior is normal. Judgment and thought content normal.  Vitals reviewed.   Filed Vitals:   12/01/14 1537  BP: 110/82  Pulse: 94  Height: 5\' 7"  (1.702 m)  Weight: 163 lb (73.936 kg)  SpO2: 96%         Assessment & Plan:     ICD-9-CM ICD-10-CM   1. Cough 786.2 R05   2. Abdominal pain, right upper quadrant 789.01 R10.11     Cough might be due to sinus drainage and acid reflux Pain might muscle spasm due to cough  PLAN  take generic fluticasone inhaler 2 squirts each nostril daily x 4 weeks Ensure you take prilosec on empty stomach STart OTC ranitidine 300mg  once daily x 4 weeks   Followup  - call in 1 week with response of cough and pain with above treatment; depending on that other tests might be ordered -  Otherwise followup in 3 months or sooner if needed   Dr. Brand Males, M.D., [facility].C.P Pulmonary and Critical Care Medicine Staff Physician [place] [facility] Pager: [phone], If no answer or between  15:00h - 7:00h: call [phone]  12/01/2014 4:48 PM

## 2014-12-01 NOTE — Patient Instructions (Signed)
ICD-9-CM ICD-10-CM   1. Cough 786.2 R05   2. Abdominal pain, right upper quadrant 789.01 R10.11    Cough might be due to sinus drainage and acid reflux Pain might muscle spasm due to cough  PLAN  take generic fluticasone inhaler 2 squirts each nostril daily x 4 weeks Ensure you take prilosec on empty stomach STart OTC ranitidine 300mg  once daily x 4 weeks   Followup  - call in 1 week with response of cough and pain with above treatment; depending on that other tests might be ordered -  Otherwise followup in 3 months or sooner if needed

## 2014-12-06 ENCOUNTER — Ambulatory Visit: Payer: BC Managed Care – PPO | Admitting: Hematology

## 2014-12-06 ENCOUNTER — Other Ambulatory Visit: Payer: BC Managed Care – PPO

## 2014-12-21 ENCOUNTER — Ambulatory Visit (INDEPENDENT_AMBULATORY_CARE_PROVIDER_SITE_OTHER): Payer: BLUE CROSS/BLUE SHIELD | Admitting: Internal Medicine

## 2014-12-21 ENCOUNTER — Encounter: Payer: Self-pay | Admitting: Internal Medicine

## 2014-12-21 VITALS — BP 142/96 | HR 72 | Temp 97.9°F | Resp 16 | Ht 67.0 in | Wt 165.8 lb

## 2014-12-21 DIAGNOSIS — J042 Acute laryngotracheitis: Secondary | ICD-10-CM

## 2014-12-21 DIAGNOSIS — J0100 Acute maxillary sinusitis, unspecified: Secondary | ICD-10-CM

## 2014-12-21 MED ORDER — AZITHROMYCIN 250 MG PO TABS: ORAL_TABLET | ORAL | Status: DC

## 2014-12-21 MED ORDER — PREDNISONE 20 MG PO TABS: ORAL_TABLET | ORAL | Status: DC

## 2014-12-21 MED ORDER — HYDROCODONE-ACETAMINOPHEN 5-325 MG PO TABS: ORAL_TABLET | ORAL | Status: AC

## 2014-12-21 NOTE — Progress Notes (Signed)
Subjective:    Patient ID: Courtney Stevens, female    DOB: 07-Apr-1956, 59 y.o.   MRN: [id number]  HPI 59 yo MWF with 1 wk hx/o head/chest congestion & productive gough w/o fever, chills, SOB. Has complicated hx/o of Whipple for [place] Cholangiocarcinoma and over the last yr of suspect FUO and now recent MRI is suspicious for local recurrence at the porta hepatitis. Patient is awaiting disposition from the tumor board at [facility] oncology services.  Medication Sig  . ALPRAZolam (XANAX) 1 MG tablet TAKE 1/2 TO 1 TABLET BY MOUTH UP TO 3 TIMES A DAY AS NEEDED  . aspirin EC 81 MG tablet Take 81 mg by mouth daily.  . calcium citrate-vitamin D (CITRACAL+D) 315-200 MG-UNIT per tablet Take 1 tablet by mouth 3 (three) times daily.  . cetirizine (ZYRTEC) 10 MG tablet Take 10 mg by mouth every morning.    Marland Kitchen CREON 12000 UNITS CPEP capsule TAKE ONE CAPSULE BY MOUTH 3 TIMES A DAY  . ferrous sulfate 325 (65 FE) MG tablet Take 325 mg by mouth daily with breakfast.  . fluticasone (FLONASE) 50 MCG/ACT nasal spray Place 2 sprays into both nostrils daily.  . furosemide (LASIX) 40 MG tablet Take 1 tablet (40 mg total) by mouth daily. (Patient taking differently: Take 40 mg by mouth 2 (two) times daily. )  . levothyroxine (SYNTHROID, LEVOTHROID) 200 MCG tablet Take 100-200 mcg by mouth daily before breakfast. Alternate taking 100 mg and 200 mg  . Magnesium 250 MG TABS Take 1 tablet by mouth 4 (four) times daily.   . metFORMIN (GLUCOPHAGE) 500 MG tablet Take 500-1,000 mg by mouth 3 (three) times daily. (1-1-2 pc)  . MULTIVITAMIN WITH MINERALS Take 1 tablet by mouth daily.   Marland Kitchen omeprazole (PRILOSEC) 40 MG capsule Take 40 mg by mouth 2 (two) times daily.  . pot chl (K-DUR) 20 MEQ tablet Take 20 mEq by mouth 2 (two) times daily.   Marland Kitchen PROMETHAZINE-DM 6.25-15 MG/5Ml Take 1 to 2 tsp 4 x day or every 4 hs if needed for cough  . sitaGLIPtin (JANUVIA) 100 MG tablet Take 1 tablet (100 mg total) by mouth daily.  . Vitamin D50000  UNITS CAPS  Take 50,000 Units by mouth daily.   Allergies  Allergen Reactions  . Ace Inhibitors Anaphylaxis  . Tilade [Nedocromil] Anaphylaxis  . Reglan [Metoclopramide] Nausea And Vomiting  . Betadine [Povidone Iodine] Rash    iching    Past Medical History  Diagnosis Date  . Cancer     bile duct ca  . GERD (gastroesophageal reflux disease)   . Diabetes mellitus   . Hypertension   . Hypothyroid   . Hyperlipidemia   . Asthma   . Hepatitis   . Heart murmur     Pt reports being told this about 3 years ago, has never seen a cardiologist.  . Anxiety   . Anemia     History of blood transfusion which is when pt reports being exposed to hepatitis in 2009.   Past Surgical History  Procedure Laterality Date  . Gastric bypass  2004  . Cholecystectomy  1992  . Bile duct resection  2009  . Appendectomy    . Tonsillectomy    . Abdominal hysterectomy  1990    BIL OOPHORECTOMY  . Tubal ligation    . Breast surgery      BIOPSY NEG  . Bunionectomy Right 2005  . Video bronchoscopy with endobronchial ultrasound N/A 10/12/2014    Procedure:  VIDEO BRONCHOSCOPY WITH ENDOBRONCHIAL ULTRASOUND;  Surgeon: Brand Males, MD;  Location: [facility] OR;  Service: Thoracic;  Laterality: N/A;   Review of Systems 10 pt sys review neg except as above    Objective:   Physical Exam   BP 142/96 mmHg  Pulse 72  Temp(Src) 97.9 F (36.6 C)  Resp 16  Ht 5\' 7"  (1.702 m)  Wt 165 lb 12.8 oz (75.206 kg)  BMI 25.96 kg/m2  Brassy congested and wheezy cough - No resp distress.  HEENT - Eac's patent. TM's Nl. EOM's full. PERRLA. Sl Frontal & Max tenderness.  NasoOroPharynx clear. Neck - supple. Nl Thyroid. Carotids 2+ & No bruits, nodes, JVD Chest - Scattered coarse Rales & rhonchi and few exp wheezes. Cor - Nl HS. RRR w/o sig MGR. PP 1(+). No edema. Abd - No palpable organomegaly, masses or tenderness. BS nl. MS- FROM w/o deformities. Muscle power, tone and bulk Nl. Gait Nl. Neuro - No obvious Cr N  abnormalities. Sensory, motor and Cerebellar functions appear Nl w/o focal abnormalities. Psyche - Mental status normal & appropriate.  No delusions, ideations or obvious mood abnormalities.  O2 sat = 96%    Assessment & Plan:   1. Laryngotracheitis   2. Acute maxillary sinusitis, recurrence not specified   - Rx - Zpak & 1 rf, Pred 20 mg #20 - pulse/taper & Norco 5 - 1/2 - 1 q3-4h prn cough or pain - ROV prn - discussed meds/SE's

## 2014-12-27 ENCOUNTER — Encounter: Payer: Self-pay | Admitting: Internal Medicine

## 2014-12-27 ENCOUNTER — Ambulatory Visit (INDEPENDENT_AMBULATORY_CARE_PROVIDER_SITE_OTHER): Payer: BLUE CROSS/BLUE SHIELD | Admitting: Internal Medicine

## 2014-12-27 VITALS — BP 124/88 | HR 76 | Temp 98.8°F | Resp 16 | Ht 67.5 in | Wt 164.4 lb

## 2014-12-27 DIAGNOSIS — E1121 Type 2 diabetes mellitus with diabetic nephropathy: Secondary | ICD-10-CM

## 2014-12-27 DIAGNOSIS — Z23 Encounter for immunization: Secondary | ICD-10-CM

## 2014-12-27 DIAGNOSIS — Z1212 Encounter for screening for malignant neoplasm of rectum: Secondary | ICD-10-CM

## 2014-12-27 DIAGNOSIS — R5383 Other fatigue: Secondary | ICD-10-CM

## 2014-12-27 DIAGNOSIS — Z0001 Encounter for general adult medical examination with abnormal findings: Secondary | ICD-10-CM

## 2014-12-27 DIAGNOSIS — E782 Mixed hyperlipidemia: Secondary | ICD-10-CM

## 2014-12-27 DIAGNOSIS — I10 Essential (primary) hypertension: Secondary | ICD-10-CM

## 2014-12-27 DIAGNOSIS — E039 Hypothyroidism, unspecified: Secondary | ICD-10-CM

## 2014-12-27 DIAGNOSIS — Z79899 Other long term (current) drug therapy: Secondary | ICD-10-CM

## 2014-12-27 DIAGNOSIS — R6889 Other general symptoms and signs: Secondary | ICD-10-CM

## 2014-12-27 DIAGNOSIS — E559 Vitamin D deficiency, unspecified: Secondary | ICD-10-CM

## 2014-12-27 LAB — HEPATIC FUNCTION PANEL
ALT: 45 U/L — AB (ref 0–35)
AST: 90 U/L — AB (ref 0–37)
Albumin: 3.1 g/dL — ABNORMAL LOW (ref 3.5–5.2)
Alkaline Phosphatase: 121 U/L — ABNORMAL HIGH (ref 39–117)
BILIRUBIN INDIRECT: 0.2 mg/dL (ref 0.2–1.2)
Bilirubin, Direct: 0.1 mg/dL (ref 0.0–0.3)
Total Bilirubin: 0.3 mg/dL (ref 0.2–1.2)
Total Protein: 6.1 g/dL (ref 6.0–8.3)

## 2014-12-27 LAB — BASIC METABOLIC PANEL WITH GFR
BUN: 10 mg/dL (ref 6–23)
CO2: 24 meq/L (ref 19–32)
CREATININE: 0.9 mg/dL (ref 0.50–1.10)
Calcium: 8.7 mg/dL (ref 8.4–10.5)
Chloride: 103 mEq/L (ref 96–112)
GFR, Est African American: 81 mL/min
GFR, Est Non African American: 70 mL/min
GLUCOSE: 185 mg/dL — AB (ref 70–99)
Potassium: 3.8 mEq/L (ref 3.5–5.3)
SODIUM: 139 meq/L (ref 135–145)

## 2014-12-27 LAB — IRON AND TIBC
%SAT: 16 % — AB (ref 20–55)
IRON: 40 ug/dL — AB (ref 42–145)
TIBC: 249 ug/dL — ABNORMAL LOW (ref 250–470)
UIBC: 209 ug/dL (ref 125–400)

## 2014-12-27 LAB — LIPID PANEL
CHOLESTEROL: 124 mg/dL (ref 0–200)
HDL: 57 mg/dL (ref >39)
LDL Cholesterol: 34 mg/dL (ref 0–99)
Total CHOL/HDL Ratio: 2.2 Ratio
Triglycerides: 163 mg/dL — ABNORMAL HIGH (ref <150)
VLDL: 33 mg/dL (ref 0–40)

## 2014-12-27 LAB — HEMOGLOBIN A1C
HEMOGLOBIN A1C: 8.5 % — AB (ref <5.7)
MEAN PLASMA GLUCOSE: 197 mg/dL — AB (ref <117)

## 2014-12-27 LAB — MAGNESIUM: Magnesium: 2 mg/dL (ref 1.5–2.5)

## 2014-12-27 LAB — TSH: TSH: 7.718 u[IU]/mL — ABNORMAL HIGH (ref 0.350–4.500)

## 2014-12-27 LAB — VITAMIN B12: Vitamin B-12: 649 pg/mL (ref 211–911)

## 2014-12-27 NOTE — Progress Notes (Signed)
Patient ID: Courtney Stevens, female   DOB: [birth year], 59 y.o.   MRN: [id number]  Annual Comprehensive Examination  This very nice 59 y.o. MWF presents for complete physical.  Patient has been followed for HTN, T2_NIDDM  w/CKD2, Hyperlipidemia, and Vitamin D Deficiency. 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 Currently she is scheduled for an u/s or CT guided Needle bx at [facility].                      HTN predates since 1989. Patient's BP has been controlled at home and patient denies any cardiac symptoms as chest pain, palpitations, shortness of breath, dizziness or ankle swelling. Today's BP: 124/88 mmHg    Patient's hyperlipidemia is controlled with diet and medications. Patient denies myalgias or other medication SE's. Last lipids were at goal - Total Cholesterol,  92; HDL  41; LDL  25; Trig 129 on 09/27/2014:   Patient has T2_NIDDM (2000) w/CKD2 predating and patient denies reactive hypoglycemic symptoms, visual blurring, diabetic polys, or paresthesias. Last A1c was 8.4% on 09/27/2014.   Finally, patient has history of Vitamin D Deficiency  Of 28 in 2008 and last Vitamin D was  83 on 09/27/2014.  Medication Sig  . ALPRAZolam (XANAX) 1 MG tablet TAKE 1/2 TO 1 TABLET BY MOUTH UP TO 3 TIMES A DAY AS NEEDED  . aspirin EC 81 MG tablet Take 81 mg by mouth daily.  Marland Kitchen CITRACAL+D 315-200 MG-UNIT per tablet Take 1 tablet by mouth 3 (three) times daily.  . cetirizine (ZYRTEC) 10 MG tablet Take 10 mg by mouth every morning.    Marland Kitchen CREON 12000 UNITS CPEP capsule TAKE ONE CAPSULE BY MOUTH 3 TIMES A DAY  . ferrous sulfate 325 (65 FE) MG tablet Take 325 mg by mouth daily with breakfast.  . fluticasone (FLONASE) 50 MCG/ACT nasal spray Place 2 sprays into both nostrils daily.  . furosemide (LASIX) 40 MG tablet Patient taking differently: Take 40 mg by mouth 2 (two) times daily   . levothyroxine (SYNTHROID, LEVOTHROID) 200 MCG tablet Take 100-200 mcg by mouth daily before breakfast. Alternate taking 100 mg and 200 mg  . Magnesium 250 MG TABS Take 1 tablet by mouth 4 (four) times daily.   . metFORMIN (GLUCOPHAGE) 500 MG tablet  Take 1 tablet at breakfast, 1 tablet at lunch, and 2 tablets at dinner  . MULTIVITAMIN WITH MINERALS Take 1 tablet by mouth daily.   Marland Kitchen omeprazole (PRILOSEC) 40 MG capsule Take 40 mg by mouth 2 (two) times daily.  . potassium chloride / K-DUR, 20 MEQ  Take 20 mEq by mouth 2 (two) times daily.   . sitaGLIPtin (JANUVIA) 100 MG tablet Take 1 tablet (100 mg total) by mouth daily.  . Vitamin D / 50,000 UNITS CAPS  Take 50,000 Units by mouth daily.   Allergies  Allergen Reactions  . Ace Inhibitors Anaphylaxis  . Tilade [Nedocromil] Anaphylaxis  . Reglan [Metoclopramide] Nausea And Vomiting  . Betadine [Povidone Iodine] Rash    iching    Past Medical History  Diagnosis Date  . Cancer     bile duct ca  . GERD (gastroesophageal reflux disease)   . Diabetes mellitus   . Hypertension   . Hypothyroid   . Hyperlipidemia   . Asthma   . Hepatitis   . Heart murmur  Pt reports being told this about 3 years ago, has never seen a cardiologist.  . Anxiety   . Anemia     History of blood transfusion which is when pt reports being exposed to hepatitis in 2009.   Health Maintenance  Topic Date Due  . FOOT EXAM  01/06/1965  . OPHTHALMOLOGY EXAM  01/06/1965  . PAP SMEAR  01/06/1973  . URINE MICROALBUMIN  12/24/2014  . HEMOGLOBIN A1C  03/28/2015  . INFLUENZA VACCINE  06/19/2015  . COLONOSCOPY  03/16/2016  . MAMMOGRAM  04/04/2016  . TETANUS/TDAP  03/24/2024  . PNEUMOCOCCAL POLYSACCHARIDE VACCINE  Completed   Immunization History  Administered Date(s) Administered  . Influenza Split 09/27/2014  . Influenza Whole 08/18/2012  . Influenza-Unspecified 08/31/2013  . Pneumococcal Conjugate-13 12/27/2014  . Pneumococcal Polysaccharide-23  11/18/2008  . Pneumococcal-Unspecified 06/14/1999  . Tdap 03/24/2014  . Zoster 03/11/2012   Past Surgical History  Procedure Laterality Date  . Gastric bypass  2004  . Cholecystectomy  1992  . Bile duct resection  2009  . Appendectomy    . Tonsillectomy    . Abdominal hysterectomy  1990    BIL OOPHORECTOMY  . Tubal ligation    . Breast surgery      BIOPSY NEG  . Bunionectomy Right 2005  . Video bronchoscopy with endobronchial ultrasound N/A 10/12/2014    Procedure: VIDEO BRONCHOSCOPY WITH ENDOBRONCHIAL ULTRASOUND;  Surgeon: Brand Males, MD;  Location: [facility] OR;  Service: Thoracic;  Laterality: N/A;   Family History  Problem Relation Age of Onset  . Colon cancer Mother   . Hypertension Mother   . Glaucoma Mother   . Diabetes Father   . Alzheimer's disease Father   . Heart attack Father   . Nephrolithiasis Father   . Emphysema Father     was a smoker  . Heart disease Father   . Nephrolithiasis Sister   . Heart attack Brother   . Nephrolithiasis Brother    History  Substance Use Topics  . Smoking status: Former Smoker -- 2.00 packs/day for 5 years    Types: Cigarettes    Quit date: 11/18/1977  . Smokeless tobacco: Never Used  . Alcohol Use: Yes     Comment: 2-3 glasses of wine once per month     ROS Constitutional: Denies fever, chills, weight loss/gain, headaches, insomnia, fatigue, night sweats, and change in appetite. Eyes: Denies redness, blurred vision, diplopia, discharge, itchy, watery eyes.  ENT: Denies discharge, congestion, post nasal drip, epistaxis, sore throat, earache, hearing loss, dental pain, Tinnitus, Vertigo, Sinus pain, snoring.  Cardio: Denies chest pain, palpitations, irregular heartbeat, syncope, dyspnea, diaphoresis, orthopnea, PND, claudication, edema Respiratory: denies cough, dyspnea, DOE, pleurisy, hoarseness, laryngitis, wheezing.  Gastrointestinal: Denies dysphagia, heartburn, reflux, water brash, pain, cramps, nausea, vomiting,  bloating, diarrhea, constipation, hematemesis, melena, hematochezia, jaundice, hemorrhoids Genitourinary: Denies dysuria, frequency, urgency, nocturia, hesitancy, discharge, hematuria, flank pain Breast: Breast lumps, nipple discharge, bleeding.  Musculoskeletal: Denies arthralgia, myalgia, stiffness, Jt. Swelling, pain, limp, and strain/sprain. Denies falls. Skin: Denies puritis, rash, hives, warts, acne, eczema, changing in skin lesion Neuro: No weakness, tremor, incoordination, spasms, paresthesia, pain Psychiatric: Denies confusion, memory loss, sensory loss. Denies Depression. Endocrine: Denies change in weight, skin, hair change, nocturia, and paresthesia, diabetic polys, visual blurring, hyper / hypo glycemic episodes.  Heme/Lymph: No excessive bleeding, bruising, enlarged lymph nodes.  Physical Exam  BP 124/88   Pulse 76  Temp 98.8 F   Resp 16  Ht 5' 7.5"   Wt 164 lb  6.4 oz     BMI 25.35   General Appearance: Well nourished and in no apparent distress. Eyes: PERRLA, EOMs, conjunctiva no swelling or erythema, normal fundi and vessels. Sinuses: No frontal/maxillary tenderness ENT/Mouth: EACs patent / TMs  nl. Nares clear without erythema, swelling, mucoid exudates. Oral hygiene is good. No erythema, swelling, or exudate. Tongue normal, non-obstructing. Tonsils not swollen or erythematous. Hearing normal.  Neck: Supple, thyroid normal. No bruits, nodes or JVD. Respiratory: Respiratory effort normal.  BS equal and clear bilateral without rales, rhonci, wheezing or stridor. Cardio: Heart sounds are normal with regular rate and rhythm and no murmurs, rubs or gallops. Peripheral pulses are normal and equal bilaterally without edema. No aortic or femoral bruits. Chest: symmetric with normal excursions and percussion. Breasts: Symmetric, without lumps, nipple discharge, retractions, or fibrocystic changes.  Abdomen: Flat, soft, with bowl sounds. Nontender, no guarding, rebound, hernias,  masses, or organomegaly.  Lymphatics: Non tender without lymphadenopathy.  Genitourinary:  Musculoskeletal: Full ROM all peripheral extremities, joint stability, 5/5 strength, and normal gait. Skin: Warm and dry without rashes, lesions, cyanosis, clubbing or  ecchymosis.  Neuro: Cranial nerves intact, reflexes equal bilaterally. Normal muscle tone, no cerebellar symptoms. Sensation intact.  Pysch: Awake and oriented X 3, normal affect, Insight and Judgment appropriate.   Assessment and Plan  1. Essential hypertension  - EKG 12-Lead - [place], RETROPERITNL ABD,  LTD  2. Hyperlipidemia  - Lipid panel  3. Type 2 diabetes mellitus with diabetic nephropathy  - Microalbumin / creatinine urine ratio - [facility] DIABETES FOOT EXAM - LOW EXTREMITY NEUR EXAM DOCUM - Hemoglobin A1c - Insulin, fasting  4. Vitamin D deficiency  - Vit D  25 hydroxy (rtn osteoporosis monitoring)  5. Hypothyroidism, unspecified hypothyroidism type  - TSH  6. Screening for rectal cancer  - POC Hemoccult Bld/Stl (3-Cd Home Screen); Future  7. Medication management  - Urine Microscopic - CBC with Differential/Platelet - BASIC METABOLIC PANEL WITH GFR - Hepatic function panel - Magnesium  8. Other fatigue  - Vitamin B12 - Iron and TIBC  9. Need for prophylactic vaccination against Streptococcus pneumoniae (pneumococcus)  - Pneumococcal conjugate vaccine 13-valent   Continue prudent diet as discussed, weight control, BP monitoring, regular exercise, and medications. Discussed med's effects and SE's. Screening labs and tests as requested with regular follow-up as recommended.

## 2014-12-27 NOTE — Patient Instructions (Signed)
Recommend the book "The END of DIETING" by Dr Excell Seltzer   & the book "The END of DIABETES " by Dr Excell Seltzer  At [email or web address] - get book & Audio CD's      Being diabetic has a  300% increased risk for heart attack, stroke, cancer, and alzheimer- type vascular dementia. It is very important that you work harder with diet by avoiding all foods that are white. Avoid white rice (brown & wild rice is OK), white potatoes (sweetpotatoes in moderation is OK), White bread or wheat bread or anything made out of white flour like bagels, donuts, rolls, buns, biscuits, cakes, pastries, cookies, pizza crust, and pasta (made from white flour & egg whites) - vegetarian pasta or spinach or wheat pasta is OK. Multigrain breads like Arnold's or [employer], or multigrain sandwich thins or flatbreads.  Diet, exercise and weight loss can reverse and cure diabetes in the early stages.  Diet, exercise and weight loss is very important in the control and prevention of complications of diabetes which affects every system in your body, ie. Brain - dementia/stroke, eyes - glaucoma/blindness, heart - heart attack/heart failure, kidneys - dialysis, stomach - gastric paralysis, intestines - malabsorption, nerves - severe painful neuritis, circulation - gangrene & loss of a leg(s), and finally cancer and Alzheimers.    I recommend avoid fried & greasy foods,  sweets/candy, white rice (brown or wild rice or Quinoa is OK), white potatoes (sweet potatoes are OK) - anything made from white flour - bagels, doughnuts, rolls, buns, biscuits,white and wheat breads, pizza crust and traditional pasta made of white flour & egg white(vegetarian pasta or spinach or wheat pasta is OK).  Multi-grain bread is OK - like multi-grain flat bread or sandwich thins. Avoid alcohol in excess. Exercise is also important.    Eat all the vegetables you want - avoid meat, especially red meat and dairy - especially cheese.  Cheese is the most  concentrated form of trans-fats which is the worst thing to clog up our arteries. Veggie cheese is OK which can be found in the fresh produce section at [employer] or [employer] or [employer]  Preventive Care for Adults A healthy lifestyle and preventive care can promote health and wellness. Preventive health guidelines for women include the following key practices.  A routine yearly physical is a good way to check with your health care provider about your health and preventive screening. It is a chance to share any concerns and updates on your health and to receive a thorough exam.  Visit your dentist for a routine exam and preventive care every 6 months. Brush your teeth twice a day and floss once a day. Good oral hygiene prevents tooth decay and gum disease.  The frequency of eye exams is based on your age, health, family medical history, use of contact lenses, and other factors. Follow your health care provider's recommendations for frequency of eye exams.  Eat a healthy diet. Foods like vegetables, fruits, whole grains, low-fat dairy products, and lean protein foods contain the nutrients you need without too many calories. Decrease your intake of foods high in solid fats, added sugars, and salt. Eat the right amount of calories for you.Get information about a proper diet from your health care provider, if necessary.  Regular physical exercise is one of the most important things you can do for your health. Most adults should get at least 150 minutes of moderate-intensity exercise (any activity that increases your heart rate and  causes you to sweat) each week. In addition, most adults need muscle-strengthening exercises on 2 or more days a week.  Maintain a healthy weight. The body mass index (BMI) is a screening tool to identify possible weight problems. It provides an estimate of body fat based on height and weight. Your health care provider can find your BMI and can help you achieve or  maintain a healthy weight.For adults 20 years and older:  A BMI below 18.5 is considered underweight.  A BMI of 18.5 to 24.9 is normal.  A BMI of 25 to 29.9 is considered overweight.  A BMI of 30 and above is considered obese.  Maintain normal blood lipids and cholesterol levels by exercising and minimizing your intake of saturated fat. Eat a balanced diet with plenty of fruit and vegetables. Blood tests for lipids and cholesterol should begin at age 57 and be repeated every 5 years. If your lipid or cholesterol levels are high, you are over 50, or you are at high risk for heart disease, you may need your cholesterol levels checked more frequently.Ongoing high lipid and cholesterol levels should be treated with medicines if diet and exercise are not working.  If you smoke, find out from your health care provider how to quit. If you do not use tobacco, do not start.  Lung cancer screening is recommended for adults aged 52-80 years who are at high risk for developing lung cancer because of a history of smoking. A yearly low-dose CT scan of the lungs is recommended for people who have at least a 30-pack-year history of smoking and are a current smoker or have quit within the past 15 years. A pack year of smoking is smoking an average of 1 pack of cigarettes a day for 1 year (for example: 1 pack a day for 30 years or 2 packs a day for 15 years). Yearly screening should continue until the smoker has stopped smoking for at least 15 years. Yearly screening should be stopped for people who develop a health problem that would prevent them from having lung cancer treatment.  High blood pressure causes heart disease and increases the risk of stroke. Your blood pressure should be checked at least every 1 to 2 years. Ongoing high blood pressure should be treated with medicines if weight loss and exercise do not work.  If you are 43-25 years old, ask your health care provider if you should take aspirin to  prevent strokes.  Diabetes screening involves taking a blood sample to check your fasting blood sugar level. This should be done once every 3 years, after age 36, if you are within normal weight and without risk factors for diabetes. Testing should be considered at a younger age or be carried out more frequently if you are overweight and have at least 1 risk factor for diabetes.  Breast cancer screening is essential preventive care for women. You should practice "breast self-awareness." This means understanding the normal appearance and feel of your breasts and may include breast self-examination. Any changes detected, no matter how small, should be reported to a health care provider. Women in their 73s and 30s should have a clinical breast exam (CBE) by a health care provider as part of a regular health exam every 1 to 3 years. After age 12, women should have a CBE every year. Starting at age 75, women should consider having a mammogram (breast X-ray test) every year. Women who have a family history of breast cancer should talk to  their health care provider about genetic screening. Women at a high risk of breast cancer should talk to their health care providers about having an MRI and a mammogram every year.  Breast cancer gene (BRCA)-related cancer risk assessment is recommended for women who have family members with BRCA-related cancers. BRCA-related cancers include breast, ovarian, tubal, and peritoneal cancers. Having family members with these cancers may be associated with an increased risk for harmful changes (mutations) in the breast cancer genes BRCA1 and BRCA2. Results of the assessment will determine the need for genetic counseling and BRCA1 and BRCA2 testing.  Routine pelvic exams to screen for cancer are no longer recommended for nonpregnant women who are considered low risk for cancer of the pelvic organs (ovaries, uterus, and vagina) and who do not have symptoms. Ask your health care provider  if a screening pelvic exam is right for you.  If you have had past treatment for cervical cancer or a condition that could lead to cancer, you need Pap tests and screening for cancer for at least 20 years after your treatment. If Pap tests have been discontinued, your risk factors (such as having a new sexual partner) need to be reassessed to determine if screening should be resumed. Some women have medical problems that increase the chance of getting cervical cancer. In these cases, your health care provider may recommend more frequent screening and Pap tests.  Colorectal cancer can be detected and often prevented. Most routine colorectal cancer screening begins at the age of 63 years and continues through age 41 years. However, your health care provider may recommend screening at an earlier age if you have risk factors for colon cancer. On a yearly basis, your health care provider may provide home test kits to check for hidden blood in the stool. Use of a small camera at the end of a tube, to directly examine the colon (sigmoidoscopy or colonoscopy), can detect the earliest forms of colorectal cancer. Talk to your health care provider about this at age 57, when routine screening begins. Direct exam of the colon should be repeated every 5-10 years through age 65 years, unless early forms of pre-cancerous polyps or small growths are found.  Hepatitis C blood testing is recommended for all people born from [birth year] through 1965 and any individual with known risks for hepatitis C.  Pra  Osteoporosis is a disease in which the bones lose minerals and strength with aging. This can result in serious bone fractures or breaks. The risk of osteoporosis can be identified using a bone density scan. Women ages 78 years and over and women at risk for fractures or osteoporosis should discuss screening with their health care providers. Ask your health care provider whether you should take a calcium supplement or vitamin D  to reduce the rate of osteoporosis.  Menopause can be associated with physical symptoms and risks. Hormone replacement therapy is available to decrease symptoms and risks. You should talk to your health care provider about whether hormone replacement therapy is right for you.  Use sunscreen. Apply sunscreen liberally and repeatedly throughout the day. You should seek shade when your shadow is shorter than you. Protect yourself by wearing long sleeves, pants, a wide-brimmed hat, and sunglasses year round, whenever you are outdoors.  Once a month, do a whole body skin exam, using a mirror to look at the skin on your back. Tell your health care provider of new moles, moles that have irregular borders, moles that are larger than a pencil  eraser, or moles that have changed in shape or color.  Stay current with required vaccines (immunizations).  Influenza vaccine. All adults should be immunized every year.  Tetanus, diphtheria, and acellular pertussis (Td, Tdap) vaccine. Pregnant women should receive 1 dose of Tdap vaccine during each pregnancy. The dose should be obtained regardless of the length of time since the last dose. Immunization is preferred during the 27th-36th week of gestation. An adult who has not previously received Tdap or who does not know her vaccine status should receive 1 dose of Tdap. This initial dose should be followed by tetanus and diphtheria toxoids (Td) booster doses every 10 years. Adults with an unknown or incomplete history of completing a 3-dose immunization series with Td-containing vaccines should begin or complete a primary immunization series including a Tdap dose. Adults should receive a Td booster every 10 years.  Varicella vaccine. An adult without evidence of immunity to varicella should receive 2 doses or a second dose if she has previously received 1 dose. Pregnant females who do not have evidence of immunity should receive the first dose after pregnancy. This first  dose should be obtained before leaving the health care facility. The second dose should be obtained 4-8 weeks after the first dose.  Human papillomavirus (HPV) vaccine. Females aged 13-26 years who have not received the vaccine previously should obtain the 3-dose series. The vaccine is not recommended for use in pregnant females. However, pregnancy testing is not needed before receiving a dose. If a female is found to be pregnant after receiving a dose, no treatment is needed. In that case, the remaining doses should be delayed until after the pregnancy. Immunization is recommended for any person with an immunocompromised condition through the age of 24 years if she did not get any or all doses earlier. During the 3-dose series, the second dose should be obtained 4-8 weeks after the first dose. The third dose should be obtained 24 weeks after the first dose and 16 weeks after the second dose.  Zoster vaccine. One dose is recommended for adults aged 52 years or older unless certain conditions are present.  Measles, mumps, and rubella (MMR) vaccine. Adults born before [birth year] generally are considered immune to measles and mumps. Adults born in [birth year] or later should have 1 or more doses of MMR vaccine unless there is a contraindication to the vaccine or there is laboratory evidence of immunity to each of the three diseases. A routine second dose of MMR vaccine should be obtained at least 28 days after the first dose for students attending postsecondary schools, health care workers, or international travelers. People who received inactivated measles vaccine or an unknown type of measles vaccine during 1963-1967 should receive 2 doses of MMR vaccine. People who received inactivated mumps vaccine or an unknown type of mumps vaccine before 1979 and are at high risk for mumps infection should consider immunization with 2 doses of MMR vaccine. For females of childbearing age, rubella immunity should be determined. If there  is no evidence of immunity, females who are not pregnant should be vaccinated. If there is no evidence of immunity, females who are pregnant should delay immunization until after pregnancy. Unvaccinated health care workers born before [birth year] who lack laboratory evidence of measles, mumps, or rubella immunity or laboratory confirmation of disease should consider measles and mumps immunization with 2 doses of MMR vaccine or rubella immunization with 1 dose of MMR vaccine.  Pneumococcal 13-valent conjugate (PCV13) vaccine. When indicated, a person who  is uncertain of her immunization history and has no record of immunization should receive the PCV13 vaccine. An adult aged 68 years or older who has certain medical conditions and has not been previously immunized should receive 1 dose of PCV13 vaccine. This PCV13 should be followed with a dose of pneumococcal polysaccharide (PPSV23) vaccine. The PPSV23 vaccine dose should be obtained at least 8 weeks after the dose of PCV13 vaccine. An adult aged 60 years or older who has certain medical conditions and previously received 1 or more doses of PPSV23 vaccine should receive 1 dose of PCV13. The PCV13 vaccine dose should be obtained 1 or more years after the last PPSV23 vaccine dose.    Pneumococcal polysaccharide (PPSV23) vaccine. When PCV13 is also indicated, PCV13 should be obtained first. All adults aged 42 years and older should be immunized. An adult younger than age 80 years who has certain medical conditions should be immunized. Any person who resides in a nursing home or long-term care facility should be immunized. An adult smoker should be immunized. People with an immunocompromised condition and certain other conditions should receive both PCV13 and PPSV23 vaccines. People with human immunodeficiency virus (HIV) infection should be immunized as soon as possible after diagnosis. Immunization during chemotherapy or radiation therapy should be avoided. Routine use  of PPSV23 vaccine is not recommended for American Indians, [place] Natives, or people younger than 65 years unless there are medical conditions that require PPSV23 vaccine. When indicated, people who have unknown immunization and have no record of immunization should receive PPSV23 vaccine. One-time revaccination 5 years after the first dose of PPSV23 is recommended for people aged 19-64 years who have chronic kidney failure, nephrotic syndrome, asplenia, or immunocompromised conditions. People who received 1-2 doses of PPSV23 before age 70 years should receive another dose of PPSV23 vaccine at age 68 years or later if at least 5 years have passed since the previous dose. Doses of PPSV23 are not needed for people immunized with PPSV23 at or after age 38 years.  Preventive Services / Frequency   Ages 9 to 79 years  Blood pressure check.  Lipid and cholesterol check.  Lung cancer screening. / Every year if you are aged 51-80 years and have a 30-pack-year history of smoking and currently smoke or have quit within the past 15 years. Yearly screening is stopped once you have quit smoking for at least 15 years or develop a health problem that would prevent you from having lung cancer treatment.  Clinical breast exam.** / Every year after age 32 years.  BRCA-related cancer risk assessment.** / For women who have family members with a BRCA-related cancer (breast, ovarian, tubal, or peritoneal cancers).  Mammogram.** / Every year beginning at age 21 years and continuing for as long as you are in good health. Consult with your health care provider.  Pap test.** / Every 3 years starting at age 56 years through age 72 or 3 years with a history of 3 consecutive normal Pap tests.  HPV screening.** / Every 3 years from ages 39 years through ages 22 to 61 years with a history of 3 consecutive normal Pap tests.  Fecal occult blood test (FOBT) of stool. / Every year beginning at age 18 years and continuing  until age 70 years. You may not need to do this test if you get a colonoscopy every 10 years.  Flexible sigmoidoscopy or colonoscopy.** / Every 5 years for a flexible sigmoidoscopy or every 10 years for a colonoscopy beginning  at age 78 years and continuing until age 65 years.  Hepatitis C blood test.** / For all people born from [birth year] through 1966 and any individual with known risks for hepatitis C.  Skin self-exam. / Monthly.  Influenza vaccine. / Every year.  Tetanus, diphtheria, and acellular pertussis (Tdap/Td) vaccine.** / Consult your health care provider. Pregnant women should receive 1 dose of Tdap vaccine during each pregnancy. 1 dose of Td every 10 years.  Varicella vaccine.** / Consult your health care provider. Pregnant females who do not have evidence of immunity should receive the first dose after pregnancy.  Zoster vaccine.** / 1 dose for adults aged 64 years or older.  Pneumococcal 13-valent conjugate (PCV13) vaccine.** / Consult your health care provider.  Pneumococcal polysaccharide (PPSV23) vaccine.** / 1 to 2 doses if you smoke cigarettes or if you have certain conditions.  Meningococcal vaccine.** / Consult your health care provider.  Hepatitis A vaccine.** / Consult your health care provider.  Hepatitis B vaccine.** / Consult your health care provider. Screening for abdominal aortic aneurysm (AAA)  by ultrasound is recommended for people over 50 who have history of high blood pressure or who are current or former smokers.

## 2014-12-28 ENCOUNTER — Other Ambulatory Visit: Payer: Self-pay | Admitting: Internal Medicine

## 2014-12-28 DIAGNOSIS — N3000 Acute cystitis without hematuria: Secondary | ICD-10-CM

## 2014-12-28 LAB — URINALYSIS, MICROSCOPIC ONLY
Casts: NONE SEEN
Squamous Epithelial / LPF: NONE SEEN

## 2014-12-28 LAB — CBC WITH DIFFERENTIAL/PLATELET
BASOS ABS: 0 10*3/uL (ref 0.0–0.1)
BASOS PCT: 0 % (ref 0–1)
Eosinophils Absolute: 0.1 10*3/uL (ref 0.0–0.7)
Eosinophils Relative: 1 % (ref 0–5)
HCT: 40 % (ref 36.0–46.0)
Hemoglobin: 12.9 g/dL (ref 12.0–15.0)
Lymphocytes Relative: 9 % — ABNORMAL LOW (ref 12–46)
Lymphs Abs: 1.3 10*3/uL (ref 0.7–4.0)
MCH: 28.6 pg (ref 26.0–34.0)
MCHC: 32.3 g/dL (ref 30.0–36.0)
MCV: 88.7 fL (ref 78.0–100.0)
MONO ABS: 0.7 10*3/uL (ref 0.1–1.0)
MPV: 8.7 fL (ref 8.6–12.4)
Monocytes Relative: 5 % (ref 3–12)
NEUTROS ABS: 12.1 10*3/uL — AB (ref 1.7–7.7)
Neutrophils Relative %: 85 % — ABNORMAL HIGH (ref 43–77)
PLATELETS: 302 10*3/uL (ref 150–400)
RBC: 4.51 MIL/uL (ref 3.87–5.11)
RDW: 16.3 % — AB (ref 11.5–15.5)
WBC: 14.2 10*3/uL — AB (ref 4.0–10.5)

## 2014-12-28 LAB — MICROALBUMIN / CREATININE URINE RATIO
CREATININE, URINE: 43.1 mg/dL
MICROALB UR: 0.3 mg/dL (ref <2.0)
Microalb Creat Ratio: 7 mg/g (ref 0.0–30.0)

## 2014-12-28 LAB — INSULIN, FASTING: Insulin fasting, serum: 2.9 u[IU]/mL (ref 2.0–19.6)

## 2014-12-28 LAB — VITAMIN D 25 HYDROXY (VIT D DEFICIENCY, FRACTURES): VIT D 25 HYDROXY: 56 ng/mL (ref 30–100)

## 2014-12-28 MED ORDER — PROMETHAZINE-DM 6.25-15 MG/5ML PO SYRP: ORAL_SOLUTION | ORAL | Status: DC

## 2014-12-28 NOTE — Addendum Note (Signed)
Addended by: Unk Pinto on: 12/28/2014 08:34 AM   Modules accepted: Orders

## 2014-12-30 ENCOUNTER — Other Ambulatory Visit: Payer: BLUE CROSS/BLUE SHIELD

## 2014-12-30 DIAGNOSIS — N3000 Acute cystitis without hematuria: Secondary | ICD-10-CM

## 2015-01-01 LAB — URINE CULTURE

## 2015-01-13 ENCOUNTER — Encounter: Payer: Self-pay | Admitting: *Deleted

## 2015-01-13 ENCOUNTER — Encounter: Payer: Self-pay | Admitting: Internal Medicine

## 2015-01-13 NOTE — Telephone Encounter (Signed)
MR please advise. Thanks! 

## 2015-01-16 ENCOUNTER — Telehealth: Payer: Self-pay | Admitting: *Deleted

## 2015-01-16 MED ORDER — ALBUTEROL SULFATE HFA 108 (90 BASE) MCG/ACT IN AERS: INHALATION_SPRAY | RESPIRATORY_TRACT | Status: DC

## 2015-01-16 MED ORDER — LEVOFLOXACIN 500 MG PO TABS: ORAL_TABLET | ORAL | Status: DC

## 2015-01-16 NOTE — Telephone Encounter (Signed)
Patient called and states finishing Z-pak and still has sinus drainage, sore throat, headache and shortness of breath.  OK to send new RX for Levaquin and Albuterol Inhaler.

## 2015-02-05 ENCOUNTER — Other Ambulatory Visit: Payer: Self-pay | Admitting: Physician Assistant

## 2015-02-08 NOTE — Telephone Encounter (Signed)
Ms. Campillo  Thanks for your question. Profuse apologies for delayed response. Tough question to answer. I do not know. 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 I would recommend just fu at [place] unless you want a sarcoid expert to weigh in (which means travel to [place], or [place] and whole new set of new hospital experience). Alternatively if is causing you symptoms or growing you could ask the [facility] doc to see if steroids woul help  Hope that helps  Thanks  Dr. Brand Males, M.D., [facility]C.P Pulmonary and Critical Care Medicine Staff Physician [facility] Pager: [phone], If no answer or between  15:00h - 7:00h: call [phone]  02/08/2015 6:17 AM

## 2015-03-01 ENCOUNTER — Other Ambulatory Visit: Payer: Self-pay

## 2015-03-01 ENCOUNTER — Encounter: Payer: Self-pay | Admitting: Internal Medicine

## 2015-03-01 MED ORDER — OMEPRAZOLE 40 MG PO CPDR: 40.0000 mg | DELAYED_RELEASE_CAPSULE | Freq: Two times a day (BID) | ORAL | Status: DC

## 2015-03-03 ENCOUNTER — Ambulatory Visit: Payer: BLUE CROSS/BLUE SHIELD | Admitting: Internal Medicine

## 2015-03-14 ENCOUNTER — Encounter: Payer: Self-pay | Admitting: Internal Medicine

## 2015-03-23 ENCOUNTER — Encounter: Payer: Self-pay | Admitting: Internal Medicine

## 2015-03-23 ENCOUNTER — Telehealth: Payer: Self-pay | Admitting: *Deleted

## 2015-03-23 ENCOUNTER — Other Ambulatory Visit: Payer: Self-pay | Admitting: *Deleted

## 2015-03-23 MED ORDER — VITAMIN D (ERGOCALCIFEROL) 1.25 MG (50000 UNIT) PO CAPS: 50000.0000 [IU] | ORAL_CAPSULE | Freq: Every day | ORAL | Status: DC

## 2015-03-23 MED ORDER — ALPRAZOLAM 1 MG PO TABS: ORAL_TABLET | ORAL | Status: DC

## 2015-03-23 MED ORDER — PANCRELIPASE (LIP-PROT-AMYL) 12000-38000 UNITS PO CPEP: 12000.0000 [IU] | ORAL_CAPSULE | Freq: Three times a day (TID) | ORAL | Status: DC

## 2015-03-23 MED ORDER — FUROSEMIDE 40 MG PO TABS
40.0000 mg | ORAL_TABLET | Freq: Every day | ORAL | Status: DC
Start: 2015-03-23 — End: 2015-10-16

## 2015-03-23 MED ORDER — METFORMIN HCL 500 MG PO TABS: 500.0000 mg | ORAL_TABLET | Freq: Three times a day (TID) | ORAL | Status: DC

## 2015-03-23 MED ORDER — LEVOTHYROXINE SODIUM 200 MCG PO TABS: 100.0000 ug | ORAL_TABLET | Freq: Every day | ORAL | Status: DC

## 2015-03-23 MED ORDER — SITAGLIPTIN PHOSPHATE 100 MG PO TABS: ORAL_TABLET | ORAL | Status: DC

## 2015-03-23 NOTE — Telephone Encounter (Signed)
Patient called and states she is taking Omeprazole 40 mg BID and still has reflux with vomiting during the night at times.  Per Dr Melford Aase, continue Omeprazole at the same dose and see her GI dooctor, Dr Deatra Ina.  Patient states she will schedule her own appointment.  Also, Ok to refill patient"s meds for 90 day supply.

## 2015-03-24 ENCOUNTER — Other Ambulatory Visit: Payer: BLUE CROSS/BLUE SHIELD

## 2015-03-24 ENCOUNTER — Telehealth: Payer: Self-pay | Admitting: *Deleted

## 2015-03-24 DIAGNOSIS — C240 Malignant neoplasm of extrahepatic bile duct: Secondary | ICD-10-CM

## 2015-03-24 LAB — HEPATIC FUNCTION PANEL
ALT: 45 U/L — AB (ref 0–35)
AST: 63 U/L — ABNORMAL HIGH (ref 0–37)
Albumin: 2.6 g/dL — ABNORMAL LOW (ref 3.5–5.2)
Alkaline Phosphatase: 167 U/L — ABNORMAL HIGH (ref 39–117)
Bilirubin, Direct: 0 mg/dL (ref 0.0–0.3)
Total Bilirubin: 0.2 mg/dL (ref 0.2–1.2)
Total Protein: 5.7 g/dL — ABNORMAL LOW (ref 6.0–8.3)

## 2015-03-24 NOTE — Telephone Encounter (Signed)
Left message for pt that labs are due. Orders are in and to come in sometime next week and have these completed and to also keep follow up appointment as scheduled

## 2015-03-24 NOTE — Telephone Encounter (Signed)
==  View-only below this line===  ----- Message -----    From: Oda Kilts, CMA    Sent: 03/19/2015      To: Oda Kilts, CMA Subject: labs due in 03/2015                             PT NEEDS A FOLLOW UP APPT 5 -2016  AFP,CEA,LFT,CA19-9     03/2015 PUT ORDERS IN

## 2015-03-25 LAB — CEA: CEA: 21.9 ng/mL — AB (ref 0.0–5.0)

## 2015-03-25 LAB — AFP TUMOR MARKER: AFP TUMOR MARKER: 1.9 ng/mL (ref <6.1)

## 2015-03-25 LAB — CANCER ANTIGEN 19-9: CA 19 9: 27 U/mL (ref <35.0)

## 2015-03-29 ENCOUNTER — Ambulatory Visit (INDEPENDENT_AMBULATORY_CARE_PROVIDER_SITE_OTHER): Payer: BLUE CROSS/BLUE SHIELD | Admitting: Internal Medicine

## 2015-03-29 ENCOUNTER — Encounter: Payer: Self-pay | Admitting: Internal Medicine

## 2015-03-29 VITALS — BP 122/78 | HR 76 | Temp 97.7°F | Resp 16 | Ht 67.0 in | Wt 169.0 lb

## 2015-03-29 DIAGNOSIS — K219 Gastro-esophageal reflux disease without esophagitis: Secondary | ICD-10-CM

## 2015-03-29 DIAGNOSIS — M7989 Other specified soft tissue disorders: Secondary | ICD-10-CM

## 2015-03-29 MED ORDER — SUCRALFATE 1 G PO TABS: 1.0000 g | ORAL_TABLET | Freq: Four times a day (QID) | ORAL | Status: DC

## 2015-03-29 NOTE — Progress Notes (Signed)
Subjective:    Patient ID: Courtney Stevens, female    DOB: Jun 08, 1956, 59 y.o.   MRN: [id number]  HPI  Patient presents to the office for evaluation of bilateral leg swelling.  She has been having this issue for 3 months.  She reports that they become very swollen especially in the afternoon and they turn red and are very tight.  She reports that sometimes they are so tight that it is hard to get up off of the toilet.  She reports that she is taking lasix 20 mg BID.  She reports the legs are swelling bilaterally equally.  She reports that she does wear compression stocking but she reports has that she cannot wear them currently because they are too tight.  She reports that she does not have a lot of salt in her diet.  She has been on lasix for years.     She also reports severe reflux and is on 40 mg of prilosec.  She reports that she is due to see her GI doctor on 6/25.  She is vomiting at least 1x per week.    Review of Systems  Constitutional: Negative for fever, chills and fatigue.  Respiratory: Negative for cough, chest tightness, shortness of breath and wheezing.   Cardiovascular: Positive for leg swelling. Negative for chest pain and palpitations.  Genitourinary: Negative for dysuria, urgency, frequency, hematuria and difficulty urinating.       Objective:   Physical Exam  Constitutional: She is oriented to person, place, and time. She appears well-developed and well-nourished. No distress.  HENT:  Head: Normocephalic and atraumatic.  Mouth/Throat: Oropharynx is clear and moist. No oropharyngeal exudate.  Eyes: Conjunctivae and EOM are normal. Pupils are equal, round, and reactive to light. No scleral icterus.  Neck: Normal range of motion. Neck supple. No JVD present. No thyromegaly present.  Cardiovascular: Normal rate, regular rhythm, normal heart sounds and intact distal pulses.  Exam reveals no gallop and no friction rub.   No murmur heard. Pulses:      Dorsalis pedis pulses  are 2+ on the right side, and 2+ on the left side.       Posterior tibial pulses are 2+ on the right side, and 2+ on the left side.  2+ non pitting edema bilaterally of the pretibial area.  Varicose veins present.  Negative holmans sign bilaterally.  NO evidence of palpable cords.  No redness, warmth or tenderness.    Pulmonary/Chest: Effort normal and breath sounds normal. No respiratory distress. She has no wheezes. She has no rales. She exhibits no tenderness.  Abdominal: Soft. Bowel sounds are normal.  Musculoskeletal: Normal range of motion.  Lymphadenopathy:    She has no cervical adenopathy.  Neurological: She is alert and oriented to person, place, and time.  Skin: Skin is warm and dry. She is not diaphoretic.  Psychiatric: She has a normal mood and affect. Her behavior is normal. Judgment and thought content normal.  Nursing note and vitals reviewed.         Assessment & Plan:    1. Gastroesophageal reflux disease, esophagitis presence not specified -discussed foods to avoid -cont omeprazole - sucralfate (CARAFATE) 1 G tablet; Take 1 tablet (1 g total) by mouth 4 (four) times daily.  Dispense: 120 tablet; Refill: 1  2. Leg swelling -increase lasix to 80 mg in the morning and 40 mg in the evening -elevate -compression stockings - Comprehensive metabolic panel - Magnesium - CBC with Differential/Platelet

## 2015-03-29 NOTE — Patient Instructions (Signed)
Please take 80 mg of lasix in the morning and then 40 mg prior to 5 pm.  Please wear your compression stockings as often as you can tolerate and elevate your legs above your heart when relaxing.    Venous Stasis or Chronic Venous Insufficiency Chronic venous insufficiency, also called venous stasis, is a condition that affects the veins in the legs. The condition prevents blood from being pumped through these veins effectively. Blood may no longer be pumped effectively from the legs back to the heart. This condition can range from mild to severe. With proper treatment, you should be able to continue with an active life. CAUSES  Chronic venous insufficiency occurs when the vein walls become stretched, weakened, or damaged or when valves within the vein are damaged. Some common causes of this include:  High blood pressure inside the veins (venous hypertension).  Increased blood pressure in the leg veins from long periods of sitting or standing.  A blood clot that blocks blood flow in a vein (deep vein thrombosis).  Inflammation of a superficial vein (phlebitis) that causes a blood clot to form. RISK FACTORS Various things can make you more likely to develop chronic venous insufficiency, including:  Family history of this condition.  Obesity.  Pregnancy.  Sedentary lifestyle.  Smoking.  Jobs requiring long periods of standing or sitting in one place.  Being a certain age. Women in their 74s and 39s and men in their 51s are more likely to develop this condition. SIGNS AND SYMPTOMS  Symptoms may include:   Varicose veins.  Skin breakdown or ulcers.  Reddened or discolored skin on the leg.  Brown, smooth, tight, and painful skin just above the ankle, usually on the inside surface (lipodermatosclerosis).  Swelling. DIAGNOSIS  To diagnose this condition, your health care provider will take a medical history and do a physical exam. The following tests may be ordered to confirm the  diagnosis:  Duplex ultrasound--A procedure that produces a picture of a blood vessel and nearby organs and also provides information on blood flow through the blood vessel.  Plethysmography--A procedure that tests blood flow.  A venogram, or venography--A procedure used to look at the veins using X-ray and dye. TREATMENT The goals of treatment are to help you return to an active life and to minimize pain or disability. Treatment will depend on the severity of the condition. Medical procedures may be needed for severe cases. Treatment options may include:   Use of compression stockings. These can help with symptoms and lower the chances of the problem getting worse, but they do not cure the problem.  Sclerotherapy--A procedure involving an injection of a material that "dissolves" the damaged veins. Other veins in the network of blood vessels take over the function of the damaged veins.  Surgery to remove the vein or cut off blood flow through the vein (vein stripping or laser ablation surgery).  Surgery to repair a valve. HOME CARE INSTRUCTIONS   Wear compression stockings as directed by your health care provider.  Only take over-the-counter or prescription medicines for pain, discomfort, or fever as directed by your health care provider.  Follow up with your health care provider as directed. SEEK MEDICAL CARE IF:   You have redness, swelling, or increasing pain in the affected area.  You see a red streak or line that extends up or down from the affected area.  You have a breakdown or loss of skin in the affected area, even if the breakdown is small.  You have an injury to the affected area. SEEK IMMEDIATE MEDICAL CARE IF:   You have an injury and open wound in the affected area.  Your pain is severe and does not improve with medicine.  You have sudden numbness or weakness in the foot or ankle below the affected area, or you have trouble moving your foot or ankle.  You have a  fever or persistent symptoms for more than 2-3 days.  You have a fever and your symptoms suddenly get worse. MAKE SURE YOU:   Understand these instructions.  Will watch your condition.  Will get help right away if you are not doing well or get worse. Document Released: 03/10/2007 Document Revised: 08/25/2013 Document Reviewed: 07/12/2013 [facility] Patient Information 2015 [place], [place]. This information is not intended to replace advice given to you by your health care provider. Make sure you discuss any questions you have with your health care provider.  Peripheral Edema You have swelling in your legs (peripheral edema). This swelling is due to excess accumulation of salt and water in your body. Edema may be a sign of heart, kidney or liver disease, or a side effect of a medication. It may also be due to problems in the leg veins. Elevating your legs and using special support stockings may be very helpful, if the cause of the swelling is due to poor venous circulation. Avoid long periods of standing, whatever the cause. Treatment of edema depends on identifying the cause. Chips, pretzels, pickles and other salty foods should be avoided. Restricting salt in your diet is almost always needed. Water pills (diuretics) are often used to remove the excess salt and water from your body via urine. These medicines prevent the kidney from reabsorbing sodium. This increases urine flow. Diuretic treatment may also result in lowering of potassium levels in your body. Potassium supplements may be needed if you have to use diuretics daily. Daily weights can help you keep track of your progress in clearing your edema. You should call your caregiver for follow up care as recommended. SEEK IMMEDIATE MEDICAL CARE IF:   You have increased swelling, pain, redness, or heat in your legs.  You develop shortness of breath, especially when lying down.  You develop chest or abdominal pain, weakness, or  fainting.  You have a fever. Document Released: 12/12/2004 Document Revised: 01/27/2012 Document Reviewed: 11/22/2009 [facility] Patient Information 2015 [place], [place]. This information is not intended to replace advice given to you by your health care provider. Make sure you discuss any questions you have with your health care provider.

## 2015-03-30 LAB — CBC WITH DIFFERENTIAL/PLATELET
BASOS ABS: 0 10*3/uL (ref 0.0–0.1)
BASOS PCT: 0 % (ref 0–1)
EOS ABS: 0.1 10*3/uL (ref 0.0–0.7)
EOS PCT: 1 % (ref 0–5)
HCT: 39.9 % (ref 36.0–46.0)
Hemoglobin: 13 g/dL (ref 12.0–15.0)
Lymphocytes Relative: 12 % (ref 12–46)
Lymphs Abs: 1.4 10*3/uL (ref 0.7–4.0)
MCH: 29.6 pg (ref 26.0–34.0)
MCHC: 32.6 g/dL (ref 30.0–36.0)
MCV: 90.9 fL (ref 78.0–100.0)
MONO ABS: 1.2 10*3/uL — AB (ref 0.1–1.0)
MONOS PCT: 10 % (ref 3–12)
MPV: 9 fL (ref 8.6–12.4)
Neutro Abs: 8.9 10*3/uL — ABNORMAL HIGH (ref 1.7–7.7)
Neutrophils Relative %: 77 % (ref 43–77)
PLATELETS: 363 10*3/uL (ref 150–400)
RBC: 4.39 MIL/uL (ref 3.87–5.11)
RDW: 14 % (ref 11.5–15.5)
WBC: 11.6 10*3/uL — AB (ref 4.0–10.5)

## 2015-03-30 LAB — COMPREHENSIVE METABOLIC PANEL
ALT: 38 U/L — AB (ref 0–35)
AST: 44 U/L — AB (ref 0–37)
Albumin: 2.6 g/dL — ABNORMAL LOW (ref 3.5–5.2)
Alkaline Phosphatase: 141 U/L — ABNORMAL HIGH (ref 39–117)
BUN: 12 mg/dL (ref 6–23)
CALCIUM: 8.2 mg/dL — AB (ref 8.4–10.5)
CHLORIDE: 101 meq/L (ref 96–112)
CO2: 28 meq/L (ref 19–32)
CREATININE: 0.94 mg/dL (ref 0.50–1.10)
Glucose, Bld: 176 mg/dL — ABNORMAL HIGH (ref 70–99)
POTASSIUM: 3.8 meq/L (ref 3.5–5.3)
SODIUM: 140 meq/L (ref 135–145)
TOTAL PROTEIN: 5.3 g/dL — AB (ref 6.0–8.3)
Total Bilirubin: 0.2 mg/dL (ref 0.2–1.2)

## 2015-03-30 LAB — MAGNESIUM: Magnesium: 1.9 mg/dL (ref 1.5–2.5)

## 2015-03-31 ENCOUNTER — Encounter: Payer: Self-pay | Admitting: Internal Medicine

## 2015-04-03 ENCOUNTER — Other Ambulatory Visit: Payer: Self-pay | Admitting: Internal Medicine

## 2015-04-03 MED ORDER — METOLAZONE 2.5 MG PO TABS: ORAL_TABLET | ORAL | Status: DC

## 2015-04-03 NOTE — Progress Notes (Signed)
Patient needs to come in for 1-2 week f/u ov.

## 2015-04-18 ENCOUNTER — Telehealth: Payer: Self-pay | Admitting: *Deleted

## 2015-04-18 MED ORDER — AZITHROMYCIN 250 MG PO TABS: ORAL_TABLET | ORAL | Status: DC

## 2015-04-18 NOTE — Telephone Encounter (Signed)
Patient called and states she has a sore throat and sinus congestion.  OK to send in a Z-pak per Dr Melford Aase.

## 2015-04-20 ENCOUNTER — Encounter: Payer: Self-pay | Admitting: Physician Assistant

## 2015-04-20 ENCOUNTER — Ambulatory Visit (INDEPENDENT_AMBULATORY_CARE_PROVIDER_SITE_OTHER): Payer: 59 | Admitting: Physician Assistant

## 2015-04-20 VITALS — BP 110/68 | HR 60 | Temp 97.7°F | Resp 16 | Ht 67.0 in | Wt 165.0 lb

## 2015-04-20 DIAGNOSIS — E559 Vitamin D deficiency, unspecified: Secondary | ICD-10-CM

## 2015-04-20 DIAGNOSIS — I10 Essential (primary) hypertension: Secondary | ICD-10-CM

## 2015-04-20 DIAGNOSIS — E039 Hypothyroidism, unspecified: Secondary | ICD-10-CM

## 2015-04-20 DIAGNOSIS — E1121 Type 2 diabetes mellitus with diabetic nephropathy: Secondary | ICD-10-CM

## 2015-04-20 DIAGNOSIS — K219 Gastro-esophageal reflux disease without esophagitis: Secondary | ICD-10-CM

## 2015-04-20 DIAGNOSIS — E782 Mixed hyperlipidemia: Secondary | ICD-10-CM

## 2015-04-20 DIAGNOSIS — Z79899 Other long term (current) drug therapy: Secondary | ICD-10-CM

## 2015-04-20 LAB — CBC WITH DIFFERENTIAL/PLATELET
Basophils Absolute: 0 10*3/uL (ref 0.0–0.1)
Basophils Relative: 0 % (ref 0–1)
EOS PCT: 2 % (ref 0–5)
Eosinophils Absolute: 0.1 10*3/uL (ref 0.0–0.7)
HCT: 40.4 % (ref 36.0–46.0)
HEMOGLOBIN: 13.2 g/dL (ref 12.0–15.0)
Lymphocytes Relative: 18 % (ref 12–46)
Lymphs Abs: 1.3 10*3/uL (ref 0.7–4.0)
MCH: 29.7 pg (ref 26.0–34.0)
MCHC: 32.7 g/dL (ref 30.0–36.0)
MCV: 91 fL (ref 78.0–100.0)
MONO ABS: 0.5 10*3/uL (ref 0.1–1.0)
MONOS PCT: 7 % (ref 3–12)
MPV: 9.3 fL (ref 8.6–12.4)
NEUTROS PCT: 73 % (ref 43–77)
Neutro Abs: 5.4 10*3/uL (ref 1.7–7.7)
Platelets: 295 10*3/uL (ref 150–400)
RBC: 4.44 MIL/uL (ref 3.87–5.11)
RDW: 14.3 % (ref 11.5–15.5)
WBC: 7.4 10*3/uL (ref 4.0–10.5)

## 2015-04-20 LAB — HEPATIC FUNCTION PANEL
ALK PHOS: 132 U/L — AB (ref 39–117)
ALT: 35 U/L (ref 0–35)
AST: 63 U/L — AB (ref 0–37)
Albumin: 2.9 g/dL — ABNORMAL LOW (ref 3.5–5.2)
BILIRUBIN DIRECT: 0.1 mg/dL (ref 0.0–0.3)
BILIRUBIN INDIRECT: 0.2 mg/dL (ref 0.2–1.2)
BILIRUBIN TOTAL: 0.3 mg/dL (ref 0.2–1.2)
TOTAL PROTEIN: 5.6 g/dL — AB (ref 6.0–8.3)

## 2015-04-20 LAB — LIPID PANEL
CHOL/HDL RATIO: 1.7 ratio
CHOLESTEROL: 87 mg/dL (ref 0–200)
HDL: 51 mg/dL (ref >=46)
LDL CALC: 21 mg/dL (ref 0–99)
TRIGLYCERIDES: 77 mg/dL (ref <150)
VLDL: 15 mg/dL (ref 0–40)

## 2015-04-20 LAB — BASIC METABOLIC PANEL WITH GFR
BUN: 14 mg/dL (ref 6–23)
CO2: 28 mEq/L (ref 19–32)
CREATININE: 0.94 mg/dL (ref 0.50–1.10)
Calcium: 9.1 mg/dL (ref 8.4–10.5)
Chloride: 99 mEq/L (ref 96–112)
GFR, EST NON AFRICAN AMERICAN: 66 mL/min
GFR, Est African American: 76 mL/min
Glucose, Bld: 176 mg/dL — ABNORMAL HIGH (ref 70–99)
Potassium: 4.7 mEq/L (ref 3.5–5.3)
Sodium: 138 mEq/L (ref 135–145)

## 2015-04-20 LAB — MAGNESIUM: Magnesium: 1.8 mg/dL (ref 1.5–2.5)

## 2015-04-20 LAB — TSH: TSH: 2.875 u[IU]/mL (ref 0.350–4.500)

## 2015-04-20 NOTE — Progress Notes (Signed)
Assessment and Plan:  1. Hypertension -Continue medication, monitor blood pressure at home. Continue DASH diet.  Reminder to go to the ER if any CP, SOB, nausea, dizziness, severe HA, changes vision/speech, left arm numbness and tingling and jaw pain.  2. Cholesterol -Continue diet and exercise. Check cholesterol.   3. Diabetes with diabetic chronic kidney disease -Continue diet and exercise. Check A1C  4. Vitamin D Def - check level and continue medications.   5. Hypothyroidism -check TSH level, continue medications the same, reminded to take on an empty stomach 30-33mins before food.   6. GERD Stop vitamin C, continue PPI for now, try to avoid NSAIDS, follow up Dr. Deatra Ina.    Continue diet and meds as discussed. Further disposition pending results of labs. Discussed med's effects and SE's.   Over 30 minutes of exam, counseling, chart review, and critical decision making was performed   HPI 59 y.o. female  presents for 3 month follow up on hypertension, cholesterol, diabetes and vitamin D deficiency.   Her blood pressure has been controlled at home, today her BP is BP: 110/68 mmHg. Patient also has history of klatkin cholangiocarcinoma s/p whipple in 2009. She is following with [facility] for FUO. Saw oncologist in May, her CEA has been increasing since Jan. She has an appointment with Dr. Deatra Ina on the 24th.   She does not workout. She denies chest pain, shortness of breath, dizziness.  She is on cholesterol medication and denies myalgias. Her cholesterol is at goal. The cholesterol was:   Lab Results  Component Value Date   CHOL 124 12/27/2014   HDL 57 12/27/2014   LDLCALC 34 12/27/2014   TRIG 163* 12/27/2014   CHOLHDL 2.2 12/27/2014    She has been working on diet and exercise for diabetes with diabetic chronic kidney disease, she is on bASA, she is not on ACE/ARB, and denies  paresthesia of the feet, polydipsia, polyuria and visual disturbances. Last A1C was:  Lab Results   Component Value Date   HGBA1C 8.5* 12/27/2014    Patient is on Vitamin D supplement. Lab Results  Component Value Date   VD25OH 76 12/27/2014   She is on thyroid medication, she is on 100mg  one day and 200mg  the other day. Her medication was not changed last visit.   Lab Results  Component Value Date   TSH 7.718* 12/27/2014  .  She is complaining of allergy symptoms, she is on zpak. She has sore throat, sneezing, clear mucus, nocturnal cough, denies fever, chills.  Her GERD is very bad, she has vomited x 2 Saturday.    Current Medications:  Current Outpatient Prescriptions on File Prior to Visit  Medication Sig Dispense Refill  . albuterol (PROVENTIL HFA;VENTOLIN HFA) 108 (90 BASE) MCG/ACT inhaler 2 inhalations 5 minutes apart every 4 hours for rescue inhaler. 1 Inhaler 2  . ALPRAZolam (XANAX) 1 MG tablet TAKE 1/2 TO 1 TABLET BY MOUTH UP TO 3 TIMES A DAY AS NEEDED 270 tablet 1  . Ascorbic Acid (VITAMIN C) 1000 MG tablet Take 1,000 mg by mouth daily. Patient taking 3000 mg daily.    Marland Kitchen aspirin EC 81 MG tablet Take 81 mg by mouth daily.    Marland Kitchen azithromycin (ZITHROMAX Z-PAK) 250 MG tablet Take 2 tabs today and then 1 tab daily x 4 days. 6 each 0  . calcium citrate-vitamin D (CITRACAL+D) 315-200 MG-UNIT per tablet Take 1 tablet by mouth 3 (three) times daily.    . cetirizine (ZYRTEC) 10 MG tablet  Take 10 mg by mouth every morning.      . ferrous sulfate 325 (65 FE) MG tablet Take 325 mg by mouth daily with breakfast.    . fluticasone (FLONASE) 50 MCG/ACT nasal spray Place 2 sprays into both nostrils daily. 16 g 2  . furosemide (LASIX) 40 MG tablet Take 1 tablet (40 mg total) by mouth daily. 90 tablet 1  . levothyroxine (SYNTHROID, LEVOTHROID) 200 MCG tablet Take 0.5-1 tablets (100-200 mcg total) by mouth daily before breakfast. Alternate taking 100 mg and 200 mg 90 tablet 1  . lipase/protease/amylase (CREON) 12000 UNITS CPEP capsule Take 1 capsule (12,000 Units total) by mouth 3 (three)  times daily. 270 capsule 99  . Magnesium 250 MG TABS Take 1 tablet by mouth 4 (four) times daily.     . metFORMIN (GLUCOPHAGE) 500 MG tablet Take 1-2 tablets (500-1,000 mg total) by mouth 3 (three) times daily. Take 1 tablet at breakfast, 1 tablet at lunch, and 2 tablets at dinner 360 tablet 1  . metolazone (ZAROXOLYN) 2.5 MG tablet Take 1 tablet daily in the morning as needed for swelling of the legs 30 tablet 0  . Multiple Vitamins-Minerals (MULTIVITAMIN WITH MINERALS) tablet Take 1 tablet by mouth daily.     Marland Kitchen omeprazole (PRILOSEC) 40 MG capsule Take 1 capsule (40 mg total) by mouth 2 (two) times daily. 180 capsule 1  . potassium chloride SA (K-DUR,KLOR-CON) 20 MEQ tablet Take 20 mEq by mouth 2 (two) times daily.     . SELENIUM PO Take 1 capsule by mouth daily.    . sitaGLIPtin (JANUVIA) 100 MG tablet TAKE 1 TABLET (100 MG TOTAL) BY MOUTH DAILY. 90 tablet 1  . sucralfate (CARAFATE) 1 G tablet Take 1 tablet (1 g total) by mouth 4 (four) times daily. 120 tablet 1  . Vitamin D, Ergocalciferol, (DRISDOL) 50000 UNITS CAPS capsule Take 1 capsule (50,000 Units total) by mouth daily. 90 capsule 1   No current facility-administered medications on file prior to visit.   Medical History:  Past Medical History  Diagnosis Date  . Cancer     bile duct ca  . GERD (gastroesophageal reflux disease)   . Diabetes mellitus   . Hypertension   . Hypothyroid   . Hyperlipidemia   . Asthma   . Hepatitis   . Heart murmur     Pt reports being told this about 3 years ago, has never seen a cardiologist.  . Anxiety   . Anemia     History of blood transfusion which is when pt reports being exposed to hepatitis in 2009.   Allergies:  Allergies  Allergen Reactions  . Ace Inhibitors Anaphylaxis  . Tilade [Nedocromil] Anaphylaxis  . Reglan [Metoclopramide] Nausea And Vomiting  . Betadine [Povidone Iodine] Rash    iching      Review of Systems:  Review of Systems  Constitutional: Positive for  malaise/fatigue. Negative for fever, chills, weight loss and diaphoresis.  HENT: Negative.   Respiratory: Negative.   Cardiovascular: Positive for leg swelling. Negative for chest pain, palpitations, orthopnea, claudication and PND.  Gastrointestinal: Positive for heartburn, nausea and vomiting. Negative for abdominal pain, diarrhea, constipation, blood in stool and melena.  Genitourinary: Negative.   Musculoskeletal: Negative.   Skin: Negative.   Neurological: Negative.  Negative for weakness.  Psychiatric/Behavioral: Negative.     Family history- Review and unchanged Social history- Review and unchanged Physical Exam: BP 110/68 mmHg  Pulse 60  Temp(Src) 97.7 F (36.5 C)  Resp  16  Ht 5\' 7"  (1.702 m)  Wt 165 lb (74.844 kg)  BMI 25.84 kg/m2 Wt Readings from Last 3 Encounters:  04/20/15 165 lb (74.844 kg)  03/29/15 169 lb (76.658 kg)  12/27/14 164 lb 6.4 oz (74.571 kg)   General Appearance: Well nourished, in no apparent distress. Eyes: PERRLA, EOMs, conjunctiva no swelling or erythema Sinuses: No Frontal/maxillary tenderness ENT/Mouth: Ext aud canals clear, TMs without erythema, bulging. No erythema, swelling, or exudate on post pharynx.  Tonsils not swollen or erythematous. Hearing normal.  Neck: Supple, thyroid normal.  Respiratory: Respiratory effort normal, BS equal bilaterally without rales, rhonchi, wheezing or stridor.  Cardio: RRR with no MRGs. Brisk peripheral pulses without edema.  Abdomen: Soft, + BS.  Non tender, no guarding, rebound, hernias, masses. Lymphatics: Non tender without lymphadenopathy.  Musculoskeletal: Full ROM, 5/5 strength, Normal gait Skin: Warm, dry without rashes, lesions, ecchymosis.  Neuro: Cranial nerves intact. No cerebellar symptoms.  Psych: Awake and oriented X 3, normal affect, Insight and Judgment appropriate.    Vicie Mutters, PA-C 8:44 AM [facility]

## 2015-04-20 NOTE — Patient Instructions (Signed)
Potassium Content of Foods  The body needs potassium to control blood pressure and to keep the muscles and nervous system healthy. Here are some healthy foods below that are high in potassium. Also you can get the white label salt of "NO SALT" salt substitute, 1/4 teaspoon of this is equivalent to 52meq potassium.   FOODS AND DRINKS HIGH IN POTASSIUM FOODS MODERATE IN POTASSIUM   Fruits  Avocado (cubed),  c / 50 g.  Banana (sliced), 75 g.  Cantaloupe (cubed), 80 g.  Honeydew, 1 wedge / 85 g.  Kiwi (sliced), 90 g.  Nectarine, 1 small / 129 g.  Orange, 1 medium / 131 g. Vegetables  Artichoke,  of a medium / 64 g.  Asparagus (boiled), 90 g..  Broccoli (boiled), 78 g.  Brussels sprout (boiled), 78 g.  Butternut squash (baked), 103 g.  Chickpea (cooked), 82 g.  Green peas (cooked), 80 g.  Kidney beans (cooked), 5 tbsp / 55 g.  Lima beans (cooked),  c / 43 g.  Navy beans (cooked),  c / 61 g.  Spinach (cooked),  c / 45 g.  Sweet potato (baked),  c / 50 g.  Tomato (chopped or sliced), 90 g.  Vegetable juice.  White mushrooms (cooked), 78 g.  Yam (cooked or baked),  c / 34 g.  Zucchini squash (boiled), 90 g. Other Foods and Drinks  Almonds (whole),  c / 36 g.  Fish, 3 oz / 85 g.  Nonfat fruit variety yogurt, 123 g.  Pistachio nuts, 1 oz / 28 g.  Pumpkin seeds, 1 oz / 28 g.  Red meat (broiled, cooked, grilled), 3 oz / 85 g.  Scallops (steamed), 3 oz / 85 g.  Spaghetti sauce,  c / 66 g.  Sunflower seeds (dry roasted), 1 oz / 28 g.  Veggie burger, 1 patty / 70 g. Fruits  Grapefruit,  of the fruit / 782 g  Plums (sliced), 83 g.  Tangerine, 1 large / 120 g. Vegetables  Carrots (boiled), 78 g.  Carrots (sliced), 61 g.  Rhubarb (cooked with sugar), 120 g.  Rutabaga (cooked), 120 g.  Yellow snap beans (cooked), 63 g. Other Foods and Drinks   Chicken breast (roasted and chopped),  c / 70 g.  Pita bread, 1 large / 64 g.  Shrimp  (steamed), 4 oz / 113 g.  Swiss cheese (diced), 70 g.        Avoid alcohol, spicy foods, NSAIDS (aleve, ibuprofen) at this time. See foods below.   Food Choices for Gastroesophageal Reflux Disease When you have gastroesophageal reflux disease (GERD), the foods you eat and your eating habits are very important. Choosing the right foods can help ease the discomfort of GERD. WHAT GENERAL GUIDELINES DO I NEED TO FOLLOW?  Choose fruits, vegetables, whole grains, low-fat dairy products, and low-fat meat, fish, and poultry.  Limit fats such as oils, salad dressings, butter, nuts, and avocado.  Keep a food diary to identify foods that cause symptoms.  Avoid foods that cause reflux. These may be different for different people.  Eat frequent small meals instead of three large meals each day.  Eat your meals slowly, in a relaxed setting.  Limit fried foods.  Cook foods using methods other than frying.  Avoid drinking alcohol.  Avoid drinking large amounts of liquids with your meals.  Avoid bending over or lying down until 2-3 hours after eating. WHAT FOODS ARE NOT RECOMMENDED? The following are some foods and drinks that may  worsen your symptoms: Vegetables Tomatoes. Tomato juice. Tomato and spaghetti sauce. Chili peppers. Onion and garlic. Horseradish. Fruits Oranges, grapefruit, and lemon (fruit and juice). Meats High-fat meats, fish, and poultry. This includes hot dogs, ribs, ham, sausage, salami, and bacon. Dairy Whole milk and chocolate milk. Sour cream. Cream. Butter. Ice cream. Cream cheese.  Beverages Coffee and tea, with or without caffeine. Carbonated beverages or energy drinks. Condiments Hot sauce. Barbecue sauce.  Sweets/Desserts Chocolate and cocoa. Donuts. Peppermint and spearmint. Fats and Oils High-fat foods, including [place] fries and potato chips. Other Vinegar. Strong spices, such as black pepper, white pepper, red pepper, cayenne, curry powder, cloves,  ginger, and chili powder.  Diabetes is a very complicated disease...lets simplify it.  An easy way to look at it to understand the complications is if you think of the extra sugar floating in your blood stream as glass shards floating through your blood stream.    Diabetes affects your small vessels first: 1) The glass shards (sugar) scraps down the tiny blood vessels in your eyes and lead to diabetic retinopathy, the leading cause of blindness in the [place]. Diabetes is the leading cause of newly diagnosed adult (59 to 60 years of age) blindness in the [place].  2) The glass shards scratches down the tiny vessels of your legs leading to nerve damage called neuropathy and can lead to amputations of your feet. More than 60% of all non-traumatic amputations of lower limbs occur in people with diabetes.  3) Over time the small vessels in your brain are shredded and closed off, individually this does not cause any problems but over a long period of time many of the small vessels being blocked can lead to Vascular Dementia.   4) Your kidney's are a filter system and have a "net" that keeps certain things in the body and lets bad things out. Sugar shreds this net and leads to kidney damage and eventually failure. Decreasing the sugar that is destroying the net and certain blood pressure medications can help stop or decrease progression of kidney disease. Diabetes was the primary cause of kidney failure in 44 percent of all new cases in 2011.  5) Diabetes also destroys the small vessels in your penis that lead to erectile dysfunction. Eventually the vessels are so damaged that you may not be responsive to cialis or viagra.   Diabetes and your large vessels: Your larger vessels consist of your coronary arteries in your heart and the carotid vessels to your brain. Diabetes or even increased sugars put you at 300% increased risk of heart attack and stroke and this is why.. The sugar scrapes down your  large blood vessels and your body sees this as an internal injury and tries to repair itself. Just like you get a scab on your skin, your platelets will stick to the blood vessel wall trying to heal it. This is why we have diabetics on low dose aspirin daily, this prevents the platelets from sticking and can prevent plaque formation. In addition, your body takes cholesterol and tries to shove it into the open wound. This is why we want your LDL, or bad cholesterol, below 70.   The combination of platelets and cholesterol over 5-10 years forms plaque that can break off and cause a heart attack or stroke.   PLEASE REMEMBER:  Diabetes is preventable! Up to 48 percent of complications and morbidities among individuals with type 2 diabetes can be prevented, delayed, or effectively treated and minimized with regular  visits to a health professional, appropriate monitoring and medication, and a healthy diet and lifestyle.  Recommendations For Diabetic/Prediabetic Patients:   -  Take medications as prescribed  -  Recommend Dr Fara Olden Fuhrman's book "The End of Diabetes "  And "The End of Dieting"- Can get at  [email or web address] and encourage also get the Audio CD book  - AVOID Animal products, ie. Meat - red/white, Poultry and Dairy/especially cheese - Exercise at least 5 times a week for 30 minutes or preferably daily.  - No Smoking - Drink less than 2 drinks a day.  - Monitor your feet for sores - Have yearly Eye Exams - Recommend annual Flu vaccine  - Recommend Pneumovax and Prevnar vaccines - Shingles Vaccine (Zostavax) if over 85 y.o.  Goals:   - BMI less than 24 - Fasting sugar less than 130 or less than 150 if tapering medicines to lose weight  - Systolic BP less than 128  - Diastolic BP less than 80 - Bad LDL Cholesterol less than 70 - Triglycerides less than 150

## 2015-04-21 LAB — VITAMIN D 25 HYDROXY (VIT D DEFICIENCY, FRACTURES): Vit D, 25-Hydroxy: 82 ng/mL (ref 30–100)

## 2015-04-21 LAB — HEMOGLOBIN A1C
Hgb A1c MFr Bld: 7.1 % — ABNORMAL HIGH (ref <5.7)
Mean Plasma Glucose: 157 mg/dL — ABNORMAL HIGH (ref <117)

## 2015-05-08 ENCOUNTER — Other Ambulatory Visit: Payer: Self-pay

## 2015-05-08 DIAGNOSIS — Z1231 Encounter for screening mammogram for malignant neoplasm of breast: Secondary | ICD-10-CM

## 2015-05-12 ENCOUNTER — Encounter: Payer: Self-pay | Admitting: Gastroenterology

## 2015-05-12 ENCOUNTER — Ambulatory Visit (INDEPENDENT_AMBULATORY_CARE_PROVIDER_SITE_OTHER): Payer: 59 | Admitting: Gastroenterology

## 2015-05-12 VITALS — BP 110/76 | HR 60 | Ht 66.14 in | Wt 161.1 lb

## 2015-05-12 DIAGNOSIS — C240 Malignant neoplasm of extrahepatic bile duct: Secondary | ICD-10-CM

## 2015-05-12 DIAGNOSIS — Z86010 Personal history of colonic polyps: Secondary | ICD-10-CM

## 2015-05-12 DIAGNOSIS — K7110 Toxic liver disease with hepatic necrosis, without coma: Secondary | ICD-10-CM

## 2015-05-12 DIAGNOSIS — R110 Nausea: Secondary | ICD-10-CM | POA: Diagnosis not present

## 2015-05-12 DIAGNOSIS — K753 Granulomatous hepatitis, not elsewhere classified: Secondary | ICD-10-CM

## 2015-05-12 DIAGNOSIS — K219 Gastro-esophageal reflux disease without esophagitis: Secondary | ICD-10-CM

## 2015-05-12 MED ORDER — NA SULFATE-K SULFATE-MG SULF 17.5-3.13-1.6 GM/177ML PO SOLN: ORAL | Status: DC

## 2015-05-12 MED ORDER — DEXLANSOPRAZOLE 60 MG PO CPDR: 60.0000 mg | DELAYED_RELEASE_CAPSULE | Freq: Every day | ORAL | Status: DC

## 2015-05-12 NOTE — Assessment & Plan Note (Addendum)
Patient appears to be symptomatic with episodes of chest fullness followed by vomiting.   Recommendations #1 continue Carafate #2 trial of dexilant 60 mg daily  Greater than 30  minutes were spent with the patient and/or family.  Greater than 50% was spent in counseling and coordination of care with the patient

## 2015-05-12 NOTE — Progress Notes (Signed)
      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  She is now followed by oncology at [facility].  A slowly rising CEA has been noted.  I) C/A/P CT scan 04-04-2015: Stable posterior right hepatic lobe mass with subcapsular soft tissue extension. There is stable to slight decrease in size of the adjacent hypoattenuating lesion. No new liver lesions are identified. Previously noted enhancing lesion along the lateral inferior right lobe of the liver is no longer identified. Slight interval decrease in the right peritoneal nodule. No new peritoneal nodules are identified. Slight interval decrease in the porta hepatis soft tissue density. Stable small bilateral pulmonary nodules. CA19-9: 24; CEA 36.6.  A large adenomatous sigmoid polyp was removed at colonoscopy in 2014.  Despite taking Carafate and omeprazole she's having intermittent fullness in her chest is followed by vomiting.  She denies dysphagia or nausea, per se.  Overall she is feeling well except for these vomiting episodes.      Review of Systems: Pertinent positive and negative review of systems were noted in the above HPI section. All other review of systems were otherwise negative.    Current Medications, Allergies, Past Medical History, Past Surgical History, Family History and Social History were reviewed in [facility] record  Vital signs were reviewed in today's medical record. Physical Exam: General: Well developed , well nourished, no acute distress Skin: anicteric Head: Normocephalic and atraumatic Eyes:  sclerae anicteric, EOMI Ears: Normal auditory acuity Mouth: No deformity or lesions Lymph Nodes: no lymphadenopathy Lungs:  Clear throughout to auscultation Heart: Regular rate and rhythm; no murmurs, rubs or brui: Gastroinestinal:  Soft, non tender and non distended. No masses, hepatosplenomegaly or hernias noted. Normal Bowel sounds Rectal:deferred Musculoskeletal: Symmetrical with no gross deformities  Pulses:  Normal pulses noted Extremities: No clubbing, cyanosis,  or deformities noted.  There is 1+ ankle edema with mild erythema Neurological: Alert oriented x 4, grossly nonfocal Psychological:  Alert and cooperative. Normal mood and affect  See Assessment and Plan under Problem List

## 2015-05-12 NOTE — Patient Instructions (Signed)
You have been scheduled for an endoscopy and colonoscopy. Please follow the written instructions given to you at your visit today. Please pick up your prep supplies at the pharmacy within the next 1-3 days. If you use inhalers (even only as needed), please bring them with you on the day of your procedure. Your physician has requested that you go to www.startemmi.com and enter the access code given to you at your visit today. This web site gives a general overview about your procedure. However, you should still follow specific instructions given to you by our office regarding your preparation for the procedure.  Do not take oral Diabetes medication the morning of your procedure.

## 2015-05-12 NOTE — Assessment & Plan Note (Addendum)
There has been no enlargement of liver masses although lymphadenopathy is concerning.  Clinically patient feels generally well with the exception of her reflux symptoms.  Rising CEA raises the question of an occult GI malignancy although none was evident by colonoscopy in 2014.    Recommendations #1 repeat colonoscopy.  If negative I will proceed with upper endoscopy at the same setting

## 2015-05-15 ENCOUNTER — Other Ambulatory Visit: Payer: Self-pay | Admitting: *Deleted

## 2015-05-15 MED ORDER — SAXAGLIPTIN HCL 5 MG PO TABS: 5.0000 mg | ORAL_TABLET | Freq: Every day | ORAL | Status: DC

## 2015-05-29 ENCOUNTER — Other Ambulatory Visit: Payer: Self-pay | Admitting: Internal Medicine

## 2015-06-05 ENCOUNTER — Other Ambulatory Visit: Payer: Self-pay | Admitting: *Deleted

## 2015-06-05 MED ORDER — METOLAZONE 2.5 MG PO TABS: 2.5000 mg | ORAL_TABLET | Freq: Every day | ORAL | Status: DC

## 2015-06-07 ENCOUNTER — Ambulatory Visit
Admission: RE | Admit: 2015-06-07 | Discharge: 2015-06-07 | Disposition: A | Discharge status: Home / Self Care | Payer: 59 | Source: Ambulatory Visit

## 2015-06-07 DIAGNOSIS — Z1231 Encounter for screening mammogram for malignant neoplasm of breast: Secondary | ICD-10-CM

## 2015-06-11 ENCOUNTER — Other Ambulatory Visit: Payer: Self-pay | Admitting: Internal Medicine

## 2015-07-02 ENCOUNTER — Other Ambulatory Visit: Payer: Self-pay | Admitting: Internal Medicine

## 2015-07-03 NOTE — Telephone Encounter (Signed)
Called Rx into [facility] 

## 2015-07-13 ENCOUNTER — Encounter: Payer: Self-pay | Admitting: Gastroenterology

## 2015-07-13 ENCOUNTER — Ambulatory Visit (AMBULATORY_SURGERY_CENTER): Payer: 59 | Admitting: Gastroenterology

## 2015-07-13 VITALS — BP 146/79 | HR 69 | Temp 96.8°F | Resp 24 | Ht 67.0 in | Wt 152.0 lb

## 2015-07-13 DIAGNOSIS — D124 Benign neoplasm of descending colon: Secondary | ICD-10-CM

## 2015-07-13 DIAGNOSIS — K219 Gastro-esophageal reflux disease without esophagitis: Secondary | ICD-10-CM

## 2015-07-13 DIAGNOSIS — Z860100 Personal history of colon polyps, unspecified: Secondary | ICD-10-CM

## 2015-07-13 DIAGNOSIS — D122 Benign neoplasm of ascending colon: Secondary | ICD-10-CM

## 2015-07-13 DIAGNOSIS — D123 Benign neoplasm of transverse colon: Secondary | ICD-10-CM | POA: Diagnosis not present

## 2015-07-13 DIAGNOSIS — K5730 Diverticulosis of large intestine without perforation or abscess without bleeding: Secondary | ICD-10-CM | POA: Diagnosis not present

## 2015-07-13 LAB — GLUCOSE, CAPILLARY
GLUCOSE-CAPILLARY: 102 mg/dL — AB (ref 65–99)
Glucose-Capillary: 84 mg/dL (ref 65–99)

## 2015-07-13 MED ORDER — SODIUM CHLORIDE 0.9 % IV SOLN: 500.0000 mL | INTRAVENOUS | Status: DC

## 2015-07-13 NOTE — Patient Instructions (Signed)

## 2015-07-13 NOTE — Op Note (Signed)
[place]  [address]. [place], [address]   ENDOSCOPY PROCEDURE REPORT  PATIENT: Courtney Stevens, Courtney Stevens  MR#: [id number] BIRTHDATE: [birth year] , 59  yrs. old GENDER: female ENDOSCOPIST: Inda Castle, MD REFERRED BY:  Unk Pinto, M.D. PROCEDURE DATE:  07/13/2015 PROCEDURE:  EGD, diagnostic ASA CLASS:     Class II INDICATIONS:  heartburn. MEDICATIONS: Residual sedation present, Monitored anesthesia care, and Propofol 100 mg IV TOPICAL ANESTHETIC:  DESCRIPTION OF PROCEDURE: After the risks benefits and alternatives of the procedure were thoroughly explained, informed consent was obtained.  The LB [id number] endoscope was introduced through the mouth and advanced to the proximal jejunum , Without limitations.  The instrument was slowly withdrawn as the mucosa was fully examined.      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  Pouch was normal.  Gastrojejunostomy site was clear.  Afferent and uterine loops were examined and were normal.  EXAM: The esophagus and gastroesophageal junction were completely normal in appearance.  The stomach was entered and closely examined.The antrum, angularis, and lesser curvature were well visualized, including a retroflexed view of the cardia and fundus. The stomach wall was normally distensable.  The scope passed easily through the pylorus into the duodenum.  Retroflexion was not performed.     The scope was then withdrawn from the patient and the procedure completed.  COMPLICATIONS: There were no immediate complications.  ENDOSCOPIC IMPRESSION: 1.   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 Pouch was normal.  Gastrojejunostomy site was clear.  Afferent and uterine loops were examined and were normal 2.   Normal appearing esophagus and GE junction, the stomach was well visualized and normal in appearance, normal appearing duodenum   RECOMMENDATIONS: Continue  dexilant  REPEAT EXAM:  eSigned:  Inda Castle, MD 07/13/2015 2:58 PM    CC: Truitt Merle, MD  PATIENT NAME:  Satin, Boal MR#: [id number]

## 2015-07-13 NOTE — Progress Notes (Signed)
Called to room to assist during endoscopic procedure.  Patient ID and intended procedure confirmed with present staff. Received instructions for my participation in the procedure from the performing physician.  

## 2015-07-13 NOTE — Progress Notes (Signed)
Transferred to recovery room. A/O x3, pleased with MAC.  VSS.  Report to Jane, RN. 

## 2015-07-13 NOTE — Op Note (Signed)
[place]  [address]. [place], [address]   COLONOSCOPY PROCEDURE REPORT  PATIENT: Courtney, Stevens  MR#: [id number] BIRTHDATE: [birth year] , 59  yrs. old GENDER: female ENDOSCOPIST: Inda Castle, MD REFERRED GP:[id number] Melford Aase, M.D. PROCEDURE DATE:  07/13/2015 PROCEDURE:   Colonoscopy, screening and Colonoscopy with snare polypectomy First Screening Colonoscopy - Avg.  risk and is 50 yrs.  old or older - No.  Prior Negative Screening - Now for repeat screening. N/A  History of Adenoma - Now for follow-up colonoscopy & has been > or = to 3 yrs.  No.  It has been less than 3 yrs since last colonoscopy.  Medical reason.  Polyps removed today? Yes ASA CLASS:   Class II INDICATIONS:PH Colon Adenoma 2014, History of cholangiocarcinoma and rising CEA. MEDICATIONS: Monitored anesthesia care and Propofol 250 mg IV  DESCRIPTION OF PROCEDURE:   After the risks benefits and alternatives of the procedure were thoroughly explained, informed consent was obtained.  The digital rectal exam revealed no abnormalities of the rectum.   The LB [id number]  endoscope was introduced through the anus and advanced to the terminal ileum which was intubated for a short distance. No adverse events experienced.   The quality of the prep was (Suprep was used) excellent.  The instrument was then slowly withdrawn as the colon was fully examined. Estimated blood loss is zero unless otherwise noted in this procedure report.      COLON FINDINGS: There was moderate diverticulosis noted in the descending colon.   There was mild diverticulosis noted in the ascending colon.  In the ascending, transverse and descending colon there were 3 sessile polyps ranging from 12-15 mm.  Each was removed with cold polypectomy snare and submitted to pathology.Retroflexed views revealed no abnormalities. The time to cecum = 6.5 Withdrawal time = 11.8   The scope was withdrawn and the procedure  completed. COMPLICATIONS: There were no immediate complications.  ENDOSCOPIC IMPRESSION: 1.   Moderate diverticulosis was noted in the descending colon 2.  colon polyps 2.   Mild diverticulosis was noted in the ascending colon  RECOMMENDATIONS: Repeat Colonoscopy in 3 years.  eSigned:  Inda Castle, MD 07/13/2015 2:51 PM   cc: Truitt Merle, MD,   PATIENT NAME:  Courtney, Stevens MR#: [id number]

## 2015-07-14 ENCOUNTER — Telehealth: Payer: Self-pay | Admitting: *Deleted

## 2015-07-14 NOTE — Telephone Encounter (Signed)
  Follow up Call-  Call back number 07/13/2015 03/16/2013  Post procedure Call Back phone  # [phone]  Permission to leave phone message Yes Yes     Patient questions:  Do you have a fever, pain , or abdominal swelling? No. Pain Score  0 *  Have you tolerated food without any problems? Yes.    Have you been able to return to your normal activities? Yes.    Do you have any questions about your discharge instructions: Diet   No. Medications  No. Follow up visit  No.  Do you have questions or concerns about your Care? No.  Actions: * If pain score is 4 or above: No action needed, pain <4.

## 2015-07-18 ENCOUNTER — Ambulatory Visit (INDEPENDENT_AMBULATORY_CARE_PROVIDER_SITE_OTHER): Payer: 59 | Admitting: Podiatry

## 2015-07-18 ENCOUNTER — Encounter: Payer: Self-pay | Admitting: Podiatry

## 2015-07-18 VITALS — BP 133/85 | HR 67 | Resp 16

## 2015-07-18 DIAGNOSIS — Q828 Other specified congenital malformations of skin: Secondary | ICD-10-CM | POA: Diagnosis not present

## 2015-07-18 DIAGNOSIS — E119 Type 2 diabetes mellitus without complications: Secondary | ICD-10-CM

## 2015-07-18 NOTE — Progress Notes (Signed)
She presents today with chief complaint of a knot to the posterior aspect of her right heel. She states that in June she went to have a pedicure and they scraped her heel which then left an open wound that finally healed. She states that now that seems to be not left there and I will not go away. She also is complaining of her other porokeratosis plantar aspect of the bilateral foot.  Objective: Vital signs are stable she is alert and oriented 3. Pulses are strongly palpable bilateral. Neurologic sensorium is intact per Semmes-Weinstein monofilament and deep tendon reflexes are intact. Muscle strength is +5 over 5 dorsiflexion plantar flexors and inverters and everters all intrinsic musculature is intact. Orthopedic evaluation of his drains all joints distal to the ankle for range of motion without crepitation. Cutaneous evaluation Mr. supple well-hydrated cutis porokeratosis are noted plantar aspect of the bilateral foot with reactive hyperkeratosis medial aspect of the IP joints hallux bilateral. A well-healing wound to the posterior aspect of the right heel with no erythema edema cellulitis drainage or odor. There appears to be some scar tissue associated with this.  Assessment: Well-healing wound posterior aspect of the right heel. Porokeratosis bilateral.  Plan: Encouraged her to wear shoe gear that did not rub this area. I also debrided all reactive hyperkeratosis today will follow up with her as needed.

## 2015-07-19 ENCOUNTER — Encounter: Payer: Self-pay | Admitting: Gastroenterology

## 2015-07-23 DIAGNOSIS — K219 Gastro-esophageal reflux disease without esophagitis: Secondary | ICD-10-CM | POA: Insufficient documentation

## 2015-07-23 NOTE — Patient Instructions (Signed)

## 2015-07-23 NOTE — Progress Notes (Signed)
Patient ID: Courtney Stevens, female   DOB: [birth year], 59 y.o.   MRN: [id number]   This very nice 59 y.o. 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 In May she had a Colonoscopy (+) polyp and EGD by Dr Deatra Ina  was non-revealing for her dyspepsia, but switching her omeprazole to Dexilant resolved her dyspepsia.    Patient is treated for HTN & BP has been controlled at home. Today's BP: 124/82 mmHg. Patient has had no complaints of any cardiac type chest pain, palpitations, dyspnea/orthopnea/PND, dizziness, claudication, or dependent edema.   Hyperlipidemia is controlled with diet & meds. Patient denies myalgias or other med SE's. 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  Last A1c was 7.1% on 04/20/2015.   Further, the patient also has history of Vitamin D Deficiency of 28 in 2008 and supplements vitamin D without any suspected side-effects. Last vitamin D was 82 on  04/20/2015.      Medication Sig  . albuterol HFA  inhaler Patient not taking  . ALPRAZolam (XANAX) 1 MG tablet TAKE 1/2 TO 1 TABLET BY MOUTH UP TO 3 TIMES A DAY AS NEEDED  . aspirin EC 81 MG tablet Take 81 mg by mouth daily.  Marland Kitchen CITRACAL+D Take 1 tablet by mouth 3 (three) times daily.  . cetirizine 10 MG tablet Take 10 mg by mouth every morning.    Marland Kitchen DEXILANT 60 MG capsule Take 1 capsule by mouth daily.  . ferrous sulfate 325 (65 FE) MG tablet Take 325 mg by mouth daily with breakfast.  . furosemide (LASIX) 40 MG tablet Take 1 tablet (40 mg total) by mouth daily.  Marland Kitchen levothyroxine  200 MCG tablet 0.5  - 1 tablet daily alternate 100 mg with 200 mg  . lipase/protease/amylase (CREON) 12000 UNITS  Take 1 capsule (12,000 Units total) by mouth 3 (three) times daily.  . Magnesium 250 MG TABS Take 1 tablet by mouth 4 (four) times daily.   . metFORMIN (GLUCOPHAGE) 500 MG tablet Take 1 tablet at breakfast, 1 tablet at lunch, and 2 tablets at dinner  . metolazone (ZAROXOLYN) 2.5 MG tablet Take 1 tablet (2.5 mg total) by mouth daily.  . Multiple Vitamins-Minerals Take 1 tablet by mouth daily.   . ONGLYZA 5 MG TABS tablet Take 5 mg by mouth daily.  . KCl (K-DUR) 20 MEQ tablet Take 20 mEq by mouth 2 (two) times daily.   . SELENIUM PO Take 1 capsule by mouth daily.  . sucralfate  1 G tablet TAKE 1 TABLET BY MOUTH 4 TIMES DAILY.  Marland Kitchen Vitamin D 50,000 UNITS  Take 1 capsule (50,000 Units total) by mouth daily.   Allergies  Allergen Reactions  . Ace Inhibitors Anaphylaxis  . Tilade [Nedocromil] Anaphylaxis  . Reglan [Metoclopramide] Nausea And Vomiting  . Betadine [Povidone Iodine] Rash    iching    PMHx:    Immunization History  Administered Date(s) Administered  . Influenza Split 09/27/2014  . Influenza Whole 08/18/2012  . Influenza-Unspecified 08/31/2013  . Pneumococcal Conjugate-13 12/27/2014  . Pneumococcal  Polysaccharide-23 11/18/2008  . Pneumococcal-Unspecified 06/14/1999  . Tdap 03/24/2014  . Zoster 03/11/2012   Past Surgical History  Procedure Laterality Date  . Gastric bypass  2004  . Cholecystectomy  1992  . Bile duct resection  2009  . Appendectomy    . Tonsillectomy    . Abdominal hysterectomy  1990    BIL OOPHORECTOMY  . Tubal ligation    . Breast surgery      BIOPSY NEG  . Bunionectomy Right 2005  . Video bronchoscopy with endobronchial ultrasound N/A 10/12/2014    Procedure: VIDEO BRONCHOSCOPY WITH ENDOBRONCHIAL ULTRASOUND;  Surgeon: Brand Males, MD;  Location: [facility] OR;  Service: Thoracic;  Laterality: N/A;   FHx:    Reviewed / unchanged  SHx:    Reviewed /  unchanged  Systems Review:  Constitutional: Denies fever, chills, wt changes, headaches, insomnia, fatigue, night sweats, change in appetite. Eyes: Denies redness, blurred vision, diplopia, discharge, itchy, watery eyes.  ENT: Denies discharge, congestion, post nasal drip, epistaxis, sore throat, earache, hearing loss, dental pain, tinnitus, vertigo, sinus pain, snoring.  CV: Denies chest pain, palpitations, irregular heartbeat, syncope, dyspnea, diaphoresis, orthopnea, PND, claudication or edema. Respiratory: denies cough, dyspnea, DOE, pleurisy, hoarseness, laryngitis, wheezing.  Gastrointestinal: Denies dysphagia, odynophagia, heartburn, reflux, water brash, abdominal pain or cramps, nausea, vomiting, bloating, diarrhea, constipation, hematemesis, melena, hematochezia  or hemorrhoids. Genitourinary: Denies  frequency, urgency, nocturia, hesitancy, discharge, hematuria or flank pain. C/o dysuria today. Musculoskeletal: Denies arthralgias, myalgias, stiffness, jt. swelling, pain, limping or strain/sprain.  Skin: Denies pruritus, rash, hives, warts, acne, eczema or change in skin lesion(s). Neuro: No weakness, tremor, incoordination, spasms, paresthesia or pain. Psychiatric: Denies confusion, memory loss or sensory loss. Endo: Denies change in weight, skin or hair change.  Heme/Lymph: No excessive bleeding, bruising or enlarged lymph nodes.  Physical Exam  BP 124/82 mmHg  Pulse 68  Temp(Src) 97.3 F (36.3 C)  Resp 16  Ht 5' 7.5" (1.715 m)  Wt 156 lb (70.761 kg)  BMI 24.06 kg/m2  Appears well nourished and in no distress. Eyes: PERRLA, EOMs, conjunctiva no swelling or erythema. Sinuses: No frontal/maxillary tenderness ENT/Mouth: EAC's clear, TM's nl w/o erythema, bulging. Nares clear w/o erythema, swelling, exudates. Oropharynx clear without erythema or exudates. Oral hygiene is good. Tongue normal, non obstructing. Hearing intact.  Neck: Supple. Thyroid nl. Car 2+/2+ without bruits,  nodes or JVD. Chest: Respirations nl with BS clear & equal w/o rales, rhonchi, wheezing or stridor.  Cor: Heart sounds normal w/ regular rate and rhythm without sig. murmurs, gallops, clicks, or rubs. Peripheral pulses normal and equal  without edema.  Abdomen: Soft & bowel sounds normal. Non-tender w/o guarding, rebound, hernias, masses, or organomegaly.  Lymphatics: Unremarkable.  Musculoskeletal: Full ROM all peripheral extremities, joint stability, 5/5 strength, and normal gait.  Skin: Warm, dry without exposed rashes, lesions or ecchymosis apparent.  Neuro: Cranial nerves intact, reflexes equal bilaterally. Sensory-motor testing grossly intact. Tendon reflexes grossly intact.  Pysch: Alert & oriented x 3.  Insight and judgement nl & appropriate. No ideations.  Assessment and Plan:  1. Essential hypertension  - TSH  2. Hyperlipidemia  - Lipid panel  3. Type 2 diabetes mellitus with diabetic chronic kidney disease  - Hemoglobin A1c - Insulin, random  4. Vitamin D deficiency  - Vit D  25 hydroxy   5. Hypothyroidism  - TSH  6. Gastroesophageal reflux disease   7. Dysuria  - Urinalysis, Routine w reflex microscopic - Urine culture  8. Medication management  -  CBC with Differential/Platelet - BASIC METABOLIC PANEL WITH GFR - Hepatic function panel - Magnesium   Recommended regular exercise, BP monitoring, weight control, and discussed med and SE's. Recommended labs to assess and monitor clinical status. Further disposition pending results of labs. Over 30 minutes of exam, counseling, chart review was performed

## 2015-07-25 ENCOUNTER — Ambulatory Visit (INDEPENDENT_AMBULATORY_CARE_PROVIDER_SITE_OTHER): Payer: 59 | Admitting: Internal Medicine

## 2015-07-25 ENCOUNTER — Encounter: Payer: Self-pay | Admitting: Internal Medicine

## 2015-07-25 VITALS — BP 124/82 | HR 68 | Temp 97.3°F | Resp 16 | Ht 67.5 in | Wt 156.0 lb

## 2015-07-25 DIAGNOSIS — E039 Hypothyroidism, unspecified: Secondary | ICD-10-CM | POA: Diagnosis not present

## 2015-07-25 DIAGNOSIS — E1122 Type 2 diabetes mellitus with diabetic chronic kidney disease: Secondary | ICD-10-CM

## 2015-07-25 DIAGNOSIS — E559 Vitamin D deficiency, unspecified: Secondary | ICD-10-CM | POA: Diagnosis not present

## 2015-07-25 DIAGNOSIS — R300 Dysuria: Secondary | ICD-10-CM

## 2015-07-25 DIAGNOSIS — E782 Mixed hyperlipidemia: Secondary | ICD-10-CM

## 2015-07-25 DIAGNOSIS — I10 Essential (primary) hypertension: Secondary | ICD-10-CM | POA: Diagnosis not present

## 2015-07-25 DIAGNOSIS — K219 Gastro-esophageal reflux disease without esophagitis: Secondary | ICD-10-CM

## 2015-07-25 DIAGNOSIS — Z79899 Other long term (current) drug therapy: Secondary | ICD-10-CM

## 2015-07-25 LAB — CBC WITH DIFFERENTIAL/PLATELET
BASOS PCT: 0 % (ref 0–1)
Basophils Absolute: 0 10*3/uL (ref 0.0–0.1)
Eosinophils Absolute: 0.1 10*3/uL (ref 0.0–0.7)
Eosinophils Relative: 2 % (ref 0–5)
HCT: 38.6 % (ref 36.0–46.0)
HEMOGLOBIN: 12.8 g/dL (ref 12.0–15.0)
Lymphocytes Relative: 24 % (ref 12–46)
Lymphs Abs: 1.6 10*3/uL (ref 0.7–4.0)
MCH: 29.8 pg (ref 26.0–34.0)
MCHC: 33.2 g/dL (ref 30.0–36.0)
MCV: 90 fL (ref 78.0–100.0)
MPV: 9.5 fL (ref 8.6–12.4)
Monocytes Absolute: 0.5 10*3/uL (ref 0.1–1.0)
Monocytes Relative: 8 % (ref 3–12)
NEUTROS ABS: 4.5 10*3/uL (ref 1.7–7.7)
NEUTROS PCT: 66 % (ref 43–77)
PLATELETS: 272 10*3/uL (ref 150–400)
RBC: 4.29 MIL/uL (ref 3.87–5.11)
RDW: 13.7 % (ref 11.5–15.5)
WBC: 6.8 10*3/uL (ref 4.0–10.5)

## 2015-07-25 NOTE — Addendum Note (Signed)
Addended by: Unk Pinto on: 07/25/2015 10:44 AM   Modules accepted: Orders

## 2015-07-26 LAB — HEPATIC FUNCTION PANEL
ALT: 30 U/L — AB (ref 6–29)
AST: 37 U/L — ABNORMAL HIGH (ref 10–35)
Albumin: 3.2 g/dL — ABNORMAL LOW (ref 3.6–5.1)
Alkaline Phosphatase: 163 U/L — ABNORMAL HIGH (ref 33–130)
BILIRUBIN DIRECT: 0.1 mg/dL (ref <=0.2)
BILIRUBIN INDIRECT: 0.2 mg/dL (ref 0.2–1.2)
BILIRUBIN TOTAL: 0.3 mg/dL (ref 0.2–1.2)
Total Protein: 6 g/dL — ABNORMAL LOW (ref 6.1–8.1)

## 2015-07-26 LAB — URINALYSIS, ROUTINE W REFLEX MICROSCOPIC
BILIRUBIN URINE: NEGATIVE
GLUCOSE, UA: NEGATIVE
Hgb urine dipstick: NEGATIVE
KETONES UR: NEGATIVE
Leukocytes, UA: NEGATIVE
Nitrite: NEGATIVE
PH: 6.5 (ref 5.0–8.0)
PROTEIN: NEGATIVE
SPECIFIC GRAVITY, URINE: 1.006 (ref 1.001–1.035)

## 2015-07-26 LAB — LIPID PANEL
CHOL/HDL RATIO: 2.7 ratio (ref <=5.0)
CHOLESTEROL: 103 mg/dL — AB (ref 125–200)
HDL: 38 mg/dL — AB (ref >=46)
LDL Cholesterol: 49 mg/dL (ref <130)
TRIGLYCERIDES: 81 mg/dL (ref <150)
VLDL: 16 mg/dL (ref <30)

## 2015-07-26 LAB — BASIC METABOLIC PANEL WITHOUT GFR
BUN: 14 mg/dL (ref 7–25)
CO2: 26 mmol/L (ref 20–31)
Calcium: 9 mg/dL (ref 8.6–10.4)
Chloride: 96 mmol/L — ABNORMAL LOW (ref 98–110)
Creat: 1.02 mg/dL — ABNORMAL HIGH (ref 0.50–0.99)
GFR, Est African American: 69 mL/min
GFR, Est Non African American: 60 mL/min
Glucose, Bld: 151 mg/dL — ABNORMAL HIGH (ref 65–99)
Potassium: 3.1 mmol/L — ABNORMAL LOW (ref 3.5–5.3)
Sodium: 141 mmol/L (ref 135–146)

## 2015-07-26 LAB — TSH: TSH: 3.006 u[IU]/mL (ref 0.350–4.500)

## 2015-07-26 LAB — VITAMIN D 25 HYDROXY (VIT D DEFICIENCY, FRACTURES): Vit D, 25-Hydroxy: 91 ng/mL (ref 30–100)

## 2015-07-26 LAB — URINE CULTURE
Colony Count: NO GROWTH
Organism ID, Bacteria: NO GROWTH

## 2015-07-26 LAB — INSULIN, RANDOM: Insulin: 3.5 u[IU]/mL (ref 2.0–19.6)

## 2015-07-26 LAB — HEMOGLOBIN A1C
HEMOGLOBIN A1C: 7.7 % — AB (ref <5.7)
Mean Plasma Glucose: 174 mg/dL — ABNORMAL HIGH (ref <117)

## 2015-07-26 LAB — MAGNESIUM: Magnesium: 1.8 mg/dL (ref 1.5–2.5)

## 2015-08-20 ENCOUNTER — Other Ambulatory Visit: Payer: Self-pay | Admitting: Internal Medicine

## 2015-08-25 ENCOUNTER — Encounter: Payer: Self-pay | Admitting: Internal Medicine

## 2015-09-05 ENCOUNTER — Other Ambulatory Visit: Payer: Self-pay | Admitting: Internal Medicine

## 2015-09-08 ENCOUNTER — Encounter: Payer: Self-pay | Admitting: Physician Assistant

## 2015-09-08 ENCOUNTER — Ambulatory Visit (INDEPENDENT_AMBULATORY_CARE_PROVIDER_SITE_OTHER): Payer: 59 | Admitting: Physician Assistant

## 2015-09-08 VITALS — BP 128/66 | HR 76 | Temp 97.7°F | Resp 14 | Ht 67.0 in | Wt 157.0 lb

## 2015-09-08 DIAGNOSIS — J0100 Acute maxillary sinusitis, unspecified: Secondary | ICD-10-CM

## 2015-09-08 DIAGNOSIS — Z23 Encounter for immunization: Secondary | ICD-10-CM

## 2015-09-08 DIAGNOSIS — J4531 Mild persistent asthma with (acute) exacerbation: Secondary | ICD-10-CM | POA: Diagnosis not present

## 2015-09-08 MED ORDER — PREDNISONE 20 MG PO TABS: ORAL_TABLET | ORAL | Status: DC

## 2015-09-08 MED ORDER — MONTELUKAST SODIUM 10 MG PO TABS: 10.0000 mg | ORAL_TABLET | Freq: Every day | ORAL | Status: DC

## 2015-09-08 MED ORDER — ALBUTEROL SULFATE HFA 108 (90 BASE) MCG/ACT IN AERS: 2.0000 | INHALATION_SPRAY | RESPIRATORY_TRACT | Status: DC | PRN

## 2015-09-08 MED ORDER — AZITHROMYCIN 250 MG PO TABS: ORAL_TABLET | ORAL | Status: DC

## 2015-09-08 MED ORDER — IPRATROPIUM-ALBUTEROL 0.5-2.5 (3) MG/3ML IN SOLN
3.0000 mL | Freq: Once | RESPIRATORY_TRACT | Status: AC
  Administered 2015-09-08: 3 mL via RESPIRATORY_TRACT

## 2015-09-08 NOTE — Progress Notes (Signed)
Subjective:    Patient ID: Courtney Stevens, female    DOB: 1956/06/30, 59 y.o.   MRN: [id number]  HPI 59 y.o. WF with history of asthma having exacerbations x 60 weeks. She normally has ventolin HFA, has proventil and feels that it does not help as much. She is on zyrtec and flonase. Sinus congestion, sinus headache,   Blood pressure 128/66, pulse 76, temperature 97.7 F (36.5 C), temperature source Temporal, resp. rate 14, height 5\' 7"  (1.702 m), weight 157 lb (71.215 kg), SpO2 99 %.  Current Outpatient Prescriptions on File Prior to Visit  Medication Sig Dispense Refill  . albuterol (PROVENTIL HFA;VENTOLIN HFA) 108 (90 BASE) MCG/ACT inhaler 2 inhalations 5 minutes apart every 4 hours for rescue inhaler. 1 Inhaler 2  . ALPRAZolam (XANAX) 1 MG tablet TAKE 1/2 TO 1 TABLET 3 TIMES A DAY 270 tablet 0  . calcium citrate-vitamin D (CITRACAL+D) 315-200 MG-UNIT per tablet Take 1 tablet by mouth 3 (three) times daily.    . cetirizine (ZYRTEC) 10 MG tablet Take 10 mg by mouth every morning.      Marland Kitchen DEXILANT 60 MG capsule Take 1 capsule by mouth daily.  3  . ferrous sulfate 325 (65 FE) MG tablet Take 325 mg by mouth daily with breakfast.    . fluticasone (FLONASE) 50 MCG/ACT nasal spray Place 2 sprays into both nostrils daily. 16 g 2  . furosemide (LASIX) 40 MG tablet Take 1 tablet (40 mg total) by mouth daily. 90 tablet 1  . levothyroxine (SYNTHROID, LEVOTHROID) 200 MCG tablet Take 200 mcg by mouth daily before breakfast. 0.5 - 1 tablet daily alternate 100 mg with 200 mg    . lipase/protease/amylase (CREON) 12000 UNITS CPEP capsule Take 1 capsule (12,000 Units total) by mouth 3 (three) times daily. 270 capsule 99  . Magnesium 250 MG TABS Take 1 tablet by mouth 4 (four) times daily.     . metFORMIN (GLUCOPHAGE) 500 MG tablet Take 1-2 tablets (500-1,000 mg total) by mouth 3 (three) times daily. Take 1 tablet at breakfast, 1 tablet at lunch, and 2 tablets at dinner 360 tablet 1  . metolazone (ZAROXOLYN)  2.5 MG tablet Take 1 tablet (2.5 mg total) by mouth daily. 30 tablet 2  . Multiple Vitamins-Minerals (MULTIVITAMIN WITH MINERALS) tablet Take 1 tablet by mouth daily.     . ONGLYZA 5 MG TABS tablet Take 5 mg by mouth daily.  11  . potassium chloride SA (K-DUR,KLOR-CON) 20 MEQ tablet Take 20 mEq by mouth 2 (two) times daily.     . sucralfate (CARAFATE) 1 G tablet TAKE 1 TABLET BY MOUTH 4 TIMES DAILY. 120 tablet 1  . Vitamin D, Ergocalciferol, (DRISDOL) 50000 UNITS CAPS capsule Take 1 capsule (50,000 Units total) by mouth daily. 90 capsule 1   No current facility-administered medications on file prior to visit.   Past Medical History  Diagnosis Date  . Cancer ([place])     bile duct ca  . GERD (gastroesophageal reflux disease)   . Diabetes mellitus   . Hypertension   . Hypothyroid   . Hyperlipidemia   . Asthma   . Hepatitis   . Heart murmur     Pt reports being told this about 3 years ago, has never seen a cardiologist.  . Anxiety   . Anemia     History of blood transfusion which is when pt reports being exposed to hepatitis in 2009.    Review of Systems  Constitutional: Positive for fatigue.  Negative for fever and chills.  HENT: Positive for congestion, rhinorrhea and sinus pressure. Negative for dental problem, ear discharge, ear pain, nosebleeds, sore throat, trouble swallowing and voice change.   Respiratory: Positive for chest tightness, shortness of breath and wheezing. Negative for cough, choking and stridor.   Cardiovascular: Negative.  Negative for chest pain.  Gastrointestinal: Negative.   Genitourinary: Negative.   Musculoskeletal: Negative.   Neurological: Negative.        Objective:   Physical Exam  Constitutional: She is oriented to person, place, and time. She appears well-developed and well-nourished.  HENT:  Right Ear: Hearing and external ear normal. No mastoid tenderness. Tympanic membrane is injected. Tympanic membrane is not perforated, not erythematous, not  retracted and not bulging. A middle ear effusion is present.  Left Ear: Hearing and external ear normal. No mastoid tenderness. Tympanic membrane is injected. Tympanic membrane is not perforated, not erythematous, not retracted and not bulging. A middle ear effusion is present.  Nose: Right sinus exhibits maxillary sinus tenderness. Left sinus exhibits maxillary sinus tenderness.  Mouth/Throat: Uvula is midline, oropharynx is clear and moist and mucous membranes are normal.  Eyes: Conjunctivae and EOM are normal. Pupils are equal, round, and reactive to light.  Neck: Neck supple.  Cardiovascular: Normal rate and regular rhythm.   Pulmonary/Chest: Effort normal. No respiratory distress. She has wheezes (diffuse). She has no rales. She exhibits no tenderness.  Abdominal: Soft. Bowel sounds are normal.  Musculoskeletal: Normal range of motion.  Lymphadenopathy:    She has no cervical adenopathy.  Neurological: She is alert and oriented to person, place, and time.  Skin: Skin is warm and dry.       Assessment & Plan:  1. Asthma with acute exacerbation, mild persistent Given breathing treatment in the office, given ventolin, prednisone, and sinuglair - predniSONE (DELTASONE) 20 MG tablet; 2 tablets daily for 3 days, 1 tablet daily for 4 days.  Dispense: 10 tablet; Refill: 0 - ipratropium-albuterol (DUONEB) 0.5-2.5 (3) MG/3ML nebulizer solution 3 mL; Take 3 mLs by nebulization once. - montelukast (SINGULAIR) 10 MG tablet; Take 1 tablet (10 mg total) by mouth daily.  Dispense: 30 tablet; Refill: 2 - albuterol (VENTOLIN HFA) 108 (90 BASE) MCG/ACT inhaler; Inhale 2 puffs into the lungs every 4 (four) hours as needed for wheezing or shortness of breath.  Dispense: 1 Inhaler; Refill: 4  2. Acute maxillary sinusitis, recurrence not specified - azithromycin (ZITHROMAX) 250 MG tablet; Take 2 tablets (500 mg) on  Day 1,  followed by 1 tablet (250 mg) once daily on Days 2 through 5.  Dispense: 6 each;  Refill: 1

## 2015-09-08 NOTE — Patient Instructions (Signed)
Asthma, Adult Asthma is a recurring condition in which the airways tighten and narrow. Asthma can make it difficult to breathe. It can cause coughing, wheezing, and shortness of breath. Asthma episodes, also called asthma attacks, range from minor to life-threatening. Asthma cannot be cured, but medicines and lifestyle changes can help control it. CAUSES Asthma is believed to be caused by inherited (genetic) and environmental factors, but its exact cause is unknown. Asthma may be triggered by allergens, lung infections, or irritants in the air. Asthma triggers are different for each person. Common triggers include:   Animal dander.  Dust mites.  Cockroaches.  Pollen from trees or grass.  Mold.  Smoke.  Air pollutants such as dust, household cleaners, hair sprays, aerosol sprays, paint fumes, strong chemicals, or strong odors.  Cold air, weather changes, and winds (which increase molds and pollens in the air).  Strong emotional expressions such as crying or laughing hard.  Stress.  Certain medicines (such as aspirin) or types of drugs (such as beta-blockers).  Sulfites in foods and drinks. Foods and drinks that may contain sulfites include dried fruit, potato chips, and sparkling grape juice.  Infections or inflammatory conditions such as the flu, a cold, or an inflammation of the nasal membranes (rhinitis).  Gastroesophageal reflux disease (GERD).  Exercise or strenuous activity. SYMPTOMS Symptoms may occur immediately after asthma is triggered or many hours later. Symptoms include:  Wheezing.  Excessive nighttime or early morning coughing.  Frequent or severe coughing with a common cold.  Chest tightness.  Shortness of breath. DIAGNOSIS  The diagnosis of asthma is made by a review of your medical history and a physical exam. Tests may also be performed. These may include:  Lung function studies. These tests show how much air you breathe in and out.  Allergy  tests.  Imaging tests such as X-rays. TREATMENT  Asthma cannot be cured, but it can usually be controlled. Treatment involves identifying and avoiding your asthma triggers. It also involves medicines. There are 2 classes of medicine used for asthma treatment:   Controller medicines. These prevent asthma symptoms from occurring. They are usually taken every day.  Reliever or rescue medicines. These quickly relieve asthma symptoms. They are used as needed and provide short-term relief. Your health care provider will help you create an asthma action plan. An asthma action plan is a written plan for managing and treating your asthma attacks. It includes a list of your asthma triggers and how they may be avoided. It also includes information on when medicines should be taken and when their dosage should be changed. An action plan may also involve the use of a device called a peak flow meter. A peak flow meter measures how well the lungs are working. It helps you monitor your condition. HOME CARE INSTRUCTIONS   Take medicines only as directed by your health care provider. Speak with your health care provider if you have questions about how or when to take the medicines.  Use a peak flow meter as directed by your health care provider. Record and keep track of readings.  Understand and use the action plan to help minimize or stop an asthma attack without needing to seek medical care.  Control your home environment in the following ways to help prevent asthma attacks:  Do not smoke. Avoid being exposed to secondhand smoke.  Change your heating and air conditioning filter regularly.  Limit your use of fireplaces and wood stoves.  Get rid of pests (such as roaches   and mice) and their droppings.  Throw away plants if you see mold on them.  Clean your floors and dust regularly. Use unscented cleaning products.  Try to have someone else vacuum for you regularly. Stay out of rooms while they are  being vacuumed and for a short while afterward. If you vacuum, use a dust mask from a hardware store, a double-layered or microfilter vacuum cleaner bag, or a vacuum cleaner with a HEPA filter.  Replace carpet with wood, tile, or vinyl flooring. Carpet can trap dander and dust.  Use allergy-proof pillows, mattress covers, and box spring covers.  Wash bed sheets and blankets every week in hot water and dry them in a dryer.  Use blankets that are made of polyester or cotton.  Clean bathrooms and kitchens with bleach. If possible, have someone repaint the walls in these rooms with mold-resistant paint. Keep out of the rooms that are being cleaned and painted.  Wash hands frequently. SEEK MEDICAL CARE IF:   You have wheezing, shortness of breath, or a cough even if taking medicine to prevent attacks.  The colored mucus you cough up (sputum) is thicker than usual.  Your sputum changes from clear or white to yellow, green, gray, or bloody.  You have any problems that may be related to the medicines you are taking (such as a rash, itching, swelling, or trouble breathing).  You are using a reliever medicine more than 2-3 times per week.  Your peak flow is still at 50-79% of your personal best after following your action plan for 1 hour.  You have a fever. SEEK IMMEDIATE MEDICAL CARE IF:   You seem to be getting worse and are unresponsive to treatment during an asthma attack.  You are short of breath even at rest.  You get short of breath when doing very little physical activity.  You have difficulty eating, drinking, or talking due to asthma symptoms.  You develop chest pain.  You develop a fast heartbeat.  You have a bluish color to your lips or fingernails.  You are light-headed, dizzy, or faint.  Your peak flow is less than 50% of your personal best.   This information is not intended to replace advice given to you by your health care provider. Make sure you discuss any  questions you have with your health care provider.   Document Released: 11/04/2005 Document Revised: 07/26/2015 Document Reviewed: 06/03/2013 Elsevier Interactive Patient Education 2016 [employer]  

## 2015-09-21 ENCOUNTER — Ambulatory Visit (INDEPENDENT_AMBULATORY_CARE_PROVIDER_SITE_OTHER): Payer: 59 | Admitting: Internal Medicine

## 2015-09-21 ENCOUNTER — Encounter: Payer: Self-pay | Admitting: Internal Medicine

## 2015-09-21 VITALS — BP 104/66 | HR 78 | Temp 98.0°F | Resp 16 | Ht 67.0 in | Wt 154.0 lb

## 2015-09-21 DIAGNOSIS — G44209 Tension-type headache, unspecified, not intractable: Secondary | ICD-10-CM | POA: Diagnosis not present

## 2015-09-21 MED ORDER — PROMETHAZINE HCL 25 MG/ML IJ SOLN
25.0000 mg | Freq: Once | INTRAMUSCULAR | Status: AC
  Administered 2015-09-21: 25 mg via INTRAMUSCULAR

## 2015-09-21 MED ORDER — KETOROLAC TROMETHAMINE 30 MG/ML IJ SOLN
30.0000 mg | Freq: Once | INTRAMUSCULAR | Status: AC
  Administered 2015-09-21: 30 mg via INTRAMUSCULAR

## 2015-09-21 NOTE — Progress Notes (Signed)
   Subjective:    Patient ID: Courtney Stevens, female    DOB: [birth year], 59 y.o.   MRN: [id number]  Headache  Associated symptoms include nausea and a sore throat. Pertinent negatives include no dizziness, fever, neck pain, numbness, rhinorrhea, vomiting or weakness.   Patient presents to the office for evaluation of headache since yesterday.  She reports that she has had a gradual headaches that started yesterday at 3.  She reports the pain is behind her left eye and goes to behind her head.  She reports that she has tried tylenol and aleve and neither seem to given her any relief.  She reports that the pain left for 15 minutes while napping.  She reports that she doesn't often have headaches like this.  She does have some mild light sensitivity.  Vision is okay.  She does not have sound sensitivity.  Nauseated with no vomiting.  No fevers.  She does have a mild sore throat and she feels like this is mostly due to her allergies.  No sick contacts.     Review of Systems  Constitutional: Negative for fever, chills and fatigue.  HENT: Positive for postnasal drip, sneezing and sore throat. Negative for congestion and rhinorrhea.   Gastrointestinal: Positive for nausea. Negative for vomiting.  Musculoskeletal: Negative for neck pain and neck stiffness.  Neurological: Positive for headaches. Negative for dizziness, weakness, light-headedness and numbness.       Objective:   Physical Exam  Constitutional: She is oriented to person, place, and time. She appears well-developed and well-nourished. No distress.  HENT:  Head: Normocephalic.  Mouth/Throat: Oropharynx is clear and moist. No oropharyngeal exudate.  Eyes: Conjunctivae are normal. No scleral icterus.  Neck: Normal range of motion. Neck supple. No JVD present. No thyromegaly present.  Cardiovascular: Normal rate, regular rhythm, normal heart sounds and intact distal pulses.  Exam reveals no gallop and no friction rub.   No murmur  heard. Pulmonary/Chest: Effort normal and breath sounds normal. No respiratory distress. She has no wheezes. She has no rales. She exhibits no tenderness.  Abdominal: Soft. Bowel sounds are normal. She exhibits no distension and no mass. There is no tenderness. There is no rebound and no guarding.  Musculoskeletal: Normal range of motion.  Lymphadenopathy:    She has no cervical adenopathy.  Neurological: She is alert and oriented to person, place, and time. No cranial nerve deficit. Coordination normal.  Skin: Skin is warm and dry. She is not diaphoretic.  Psychiatric: She has a normal mood and affect. Her behavior is normal. Judgment and thought content normal.  Nursing note and vitals reviewed.   Filed Vitals:   09/21/15 1422  BP: 104/66  Pulse: 78  Temp: 98 F (36.7 C)  Resp: 16         Assessment & Plan:    1. Tension-type headache, not intractable, unspecified chronicity pattern -toradol -phenergan -relpax sample to go home with and take in 1 hour if no significant improvement

## 2015-09-21 NOTE — Addendum Note (Signed)
Addended by: Rashidi Loh A on: 09/21/2015 03:05 PM   Modules accepted: Orders

## 2015-10-01 ENCOUNTER — Other Ambulatory Visit: Payer: Self-pay | Admitting: Internal Medicine

## 2015-10-01 DIAGNOSIS — F411 Generalized anxiety disorder: Secondary | ICD-10-CM

## 2015-10-11 ENCOUNTER — Other Ambulatory Visit: Payer: Self-pay | Admitting: Internal Medicine

## 2015-10-11 DIAGNOSIS — F411 Generalized anxiety disorder: Secondary | ICD-10-CM

## 2015-10-16 ENCOUNTER — Encounter: Payer: Self-pay | Admitting: Internal Medicine

## 2015-10-16 ENCOUNTER — Ambulatory Visit (INDEPENDENT_AMBULATORY_CARE_PROVIDER_SITE_OTHER): Payer: 59 | Admitting: Internal Medicine

## 2015-10-16 ENCOUNTER — Other Ambulatory Visit: Payer: Self-pay | Admitting: Internal Medicine

## 2015-10-16 VITALS — BP 110/74 | HR 76 | Temp 99.3°F | Resp 16 | Ht 67.0 in | Wt 159.4 lb

## 2015-10-16 DIAGNOSIS — Z79899 Other long term (current) drug therapy: Secondary | ICD-10-CM

## 2015-10-16 DIAGNOSIS — J0100 Acute maxillary sinusitis, unspecified: Secondary | ICD-10-CM

## 2015-10-16 DIAGNOSIS — Z6825 Body mass index (BMI) 25.0-25.9, adult: Secondary | ICD-10-CM | POA: Diagnosis not present

## 2015-10-16 DIAGNOSIS — R531 Weakness: Secondary | ICD-10-CM | POA: Diagnosis not present

## 2015-10-16 DIAGNOSIS — H6503 Acute serous otitis media, bilateral: Secondary | ICD-10-CM

## 2015-10-16 DIAGNOSIS — E1121 Type 2 diabetes mellitus with diabetic nephropathy: Secondary | ICD-10-CM | POA: Diagnosis not present

## 2015-10-16 DIAGNOSIS — N3090 Cystitis, unspecified without hematuria: Secondary | ICD-10-CM | POA: Diagnosis not present

## 2015-10-16 LAB — CBC WITH DIFFERENTIAL/PLATELET
BASOS ABS: 0 10*3/uL (ref 0.0–0.1)
Basophils Relative: 0 % (ref 0–1)
EOS ABS: 0 10*3/uL (ref 0.0–0.7)
EOS PCT: 0 % (ref 0–5)
HEMATOCRIT: 34 % — AB (ref 36.0–46.0)
Hemoglobin: 11.3 g/dL — ABNORMAL LOW (ref 12.0–15.0)
LYMPHS PCT: 5 % — AB (ref 12–46)
Lymphs Abs: 0.8 10*3/uL (ref 0.7–4.0)
MCH: 30 pg (ref 26.0–34.0)
MCHC: 33.2 g/dL (ref 30.0–36.0)
MCV: 90.2 fL (ref 78.0–100.0)
MPV: 10.2 fL (ref 8.6–12.4)
Monocytes Absolute: 1.4 10*3/uL — ABNORMAL HIGH (ref 0.1–1.0)
Monocytes Relative: 9 % (ref 3–12)
NEUTROS PCT: 86 % — AB (ref 43–77)
Neutro Abs: 13.6 10*3/uL — ABNORMAL HIGH (ref 1.7–7.7)
PLATELETS: 164 10*3/uL (ref 150–400)
RBC: 3.77 MIL/uL — AB (ref 3.87–5.11)
RDW: 13.7 % (ref 11.5–15.5)
WBC: 15.8 10*3/uL — AB (ref 4.0–10.5)

## 2015-10-16 LAB — BASIC METABOLIC PANEL WITH GFR
BUN: 37 mg/dL — ABNORMAL HIGH (ref 7–25)
CO2: 28 mmol/L (ref 20–31)
Calcium: 8.5 mg/dL — ABNORMAL LOW (ref 8.6–10.4)
Chloride: 83 mmol/L — ABNORMAL LOW (ref 98–110)
Creat: 2.74 mg/dL — ABNORMAL HIGH (ref 0.50–0.99)
GFR, EST AFRICAN AMERICAN: 21 mL/min — AB (ref >=60)
GFR, EST NON AFRICAN AMERICAN: 18 mL/min — AB (ref >=60)
Glucose, Bld: 391 mg/dL — ABNORMAL HIGH (ref 65–99)
POTASSIUM: 4.1 mmol/L (ref 3.5–5.3)
Sodium: 125 mmol/L — ABNORMAL LOW (ref 135–146)

## 2015-10-16 LAB — MAGNESIUM: MAGNESIUM: 2 mg/dL (ref 1.5–2.5)

## 2015-10-16 MED ORDER — PREDNISONE 20 MG PO TABS: ORAL_TABLET | ORAL | Status: DC

## 2015-10-16 MED ORDER — MAGNESIUM 500 MG PO TABS: ORAL_TABLET | ORAL | Status: AC

## 2015-10-16 MED ORDER — LEVOFLOXACIN 500 MG PO TABS: 500.0000 mg | ORAL_TABLET | Freq: Every day | ORAL | Status: DC

## 2015-10-16 MED ORDER — FUROSEMIDE 40 MG PO TABS: ORAL_TABLET | ORAL | Status: DC

## 2015-10-16 NOTE — Patient Instructions (Signed)
  Stop Zaroxolyn   (Metolazone)   ======================================  Try store brand of [id number]   with Phenylephrine as a decongestant  Not Pseudophedrine

## 2015-10-16 NOTE — Progress Notes (Signed)
Subjective:    Patient ID: Courtney Stevens, female    DOB: Mar 09, 1956, 59 y.o.   MRN: [id number]  HPI  Patient presents with several c/o including Head congested, ST, stopped up, ears popping, feels off-balance, ? Fever, myalgias, weakness and difficulty standing from a sitting position. Also, c/o cloudy urine with an odor and dysuria. Has hx/o refractory hypokalemia and is on Metolazone 2 x week and Lasix 80 mg 2 x/day.   Relates that her oncologist at [facility] following her for her [place] (2009) has given her an option to restart Chemo vs waiting 4 month till her next CTscan following sl increase in size of a 1-1.5 cm nodule ? Retroperitoneal.   Medication Sig  . albuterol (VENTOLIN HFA) 108 (90 BASE) MCG/ACT inhaler Inhale 2 puffs into the lungs every 4 (four) hours as needed for wheezing or shortness of breath.  . ALPRAZolam (XANAX) 1 MG tablet TAKE 1/2 TO 1 TABLET 3 TIMES A DAY  . calcium citrate-vitamin D (CITRACAL+D) 315-200 MG-UNIT per tablet Take 1 tablet by mouth 3 (three) times daily.  . cetirizine (ZYRTEC) 10 MG tablet Take 10 mg by mouth every morning.    Marland Kitchen DEXILANT 60 MG capsule Take 1 capsule by mouth daily.  . ferrous sulfate 325 (65 FE) MG tablet Take 325 mg by mouth daily with breakfast.  . fluticasone (FLONASE) 50 MCG/ACT nasal spray Place 2 sprays into both nostrils daily.  Marland Kitchen levothyroxine (SYNTHROID, LEVOTHROID) 200 MCG tablet Take 200 mcg by mouth daily before breakfast. 0.5 - 1 tablet daily alternate 100 mg with 200 mg  . lipase/protease/amylase (CREON) 12000 UNITS CPEP capsule Take 1 capsule (12,000 Units total) by mouth 3 (three) times daily.  . metFORMIN (GLUCOPHAGE) 500 MG tablet Take 1-2 tablets (500-1,000 mg total) by mouth 3 (three) times daily. Take 1 tablet at breakfast, 1 tablet at lunch, and 2 tablets at dinner  . Multiple Vitamins-Minerals (MULTIVITAMIN WITH MINERALS) tablet Take 1 tablet by mouth daily.   . ONGLYZA 5 MG TABS tablet Take 5 mg by  mouth daily.  . potassium chloride SA (K-DUR,KLOR-CON) 20 MEQ tablet Take 20 mEq by mouth 4 (four) times daily.   . sucralfate (CARAFATE) 1 G tablet TAKE 1 TABLET BY MOUTH 4 TIMES DAILY.  Marland Kitchen Vitamin D, Ergocalciferol, (DRISDOL) 50000 UNITS CAPS capsule Take 1 capsule (50,000 Units total) by mouth daily.  . furosemide (LASIX) 40 MG tablet Take 1 tablet (40 mg total) by mouth daily.  . Magnesium 250 MG TABS Take 1 tablet by mouth 4 (four) times daily.   . metolazone (ZAROXOLYN) 2.5 MG tablet Take 1 tablet (2.5 mg total) by mouth daily.  . montelukast (SINGULAIR) 10 MG tablet Take 1 tablet (10 mg total) by mouth daily.   Allergies  Allergen Reactions  . Ace Inhibitors Anaphylaxis  . Tilade [Nedocromil] Anaphylaxis  . Reglan [Metoclopramide] Nausea And Vomiting  . Betadine [Povidone Iodine] Rash    iching    Past Medical History  Diagnosis Date  . Cancer ([place])     bile duct ca  . GERD (gastroesophageal reflux disease)   . Diabetes mellitus   . Hypertension   . Hypothyroid   . Hyperlipidemia   . Asthma   . Hepatitis   . Heart murmur     Pt reports being told this about 3 years ago, has never seen a cardiologist.  . Anxiety   . Anemia     History of blood transfusion which is when pt  reports being exposed to hepatitis in 2009.   Past Surgical History  Procedure Laterality Date  . Gastric bypass  2004  . Cholecystectomy  1992  . Bile duct resection  2009  . Appendectomy    . Tonsillectomy    . Abdominal hysterectomy  1990    BIL OOPHORECTOMY  . Tubal ligation    . Breast surgery      BIOPSY NEG  . Bunionectomy Right 2005  . Video bronchoscopy with endobronchial ultrasound N/A 10/12/2014    Procedure: VIDEO BRONCHOSCOPY WITH ENDOBRONCHIAL ULTRASOUND;  Surgeon: Brand Males, MD;  Location: [facility] OR;  Service: Thoracic;  Laterality: N/A;   Review of Systems  10 point systems review negative except as above.    Objective:   Physical Exam  BP 110/74 mmHg  Pulse 76   Temp(Src) 99.3 F (37.4 C)  Resp 16  Ht 5\' 7"  (1.702 m)  Wt 159 lb 6.4 oz (72.303 kg)  BMI 24.96 kg/m2  HEENT - Eac's patent. TM's retracted. EOM's full. PERRLA. Sl Maxillary tenderness. NasoOroPharynx clear 1+ injected w/o exudates. Neck - supple. Nl Thyroid. Carotids 2+ & No bruits, nodes, JVD Chest - Clear equal BS w/o Rales, rhonchi, wheezes. Cor - Nl HS. RRR w/o sig MGR. PP 1(+). No edema. Abd - No palpable organomegaly, masses or tenderness. BS nl. MS- FROM w/o deformities. Gen decrease in muscle power, tone and bulk. Gait Nl. (+) Gower's.  Neuro - No obvious Cr N abnormalities. Sensory, motor and Cerebellar functions appear Nl w/o focal abnormalities. Psyche - Mental status normal & appropriate.  No delusions, ideations or obvious mood abnormalities. Skin - Clear w/o rash, cyansis ecchymoses.    Assessment & Plan:   1. Acute maxillary sinusitis, recurrence not specified  - predniSONE (DELTASONE) 20 MG tablet; 1 tab 3 x day for 3 days, then 1 tab 2 x day for 3 days, then 1 tab 1 x day for 5 days  Dispense: 20 tablet; Refill: 0 - levofloxacin (LEVAQUIN) 500 MG tablet; Take 1 tablet (500 mg total) by mouth daily.  Dispense: 10 tablet; Refill: 0  2. Bilateral acute serous otitis media, recurrence not specified  - predniSONE (DELTASONE) 20 MG tablet; 1 tab 3 x day for 3 days, then 1 tab 2 x day for 3 days, then 1 tab 1 x day for 5 days  Dispense: 20 tablet; Refill: 0  3. Weakness  - d/c Metalozone pending electrolytes/K+   4. Cystitis  - levofloxacin (LEVAQUIN) 500 MG tablet; Take 1 tablet (500 mg total) by mouth daily.  Dispense: 10 tablet; Refill: 0 - Urinalysis, Routine w reflex microscopic (not at [facility]); Future - Urine culture; Future - Urinalysis, Routine w reflex microscopic (not at [facility]) - Urine culture  5. Medication management  - confirmed current furosemide (LASIX) 40 MG tablet; Take 1 to 2 tablets 2 x day for fluid & swelling  Dispense: 90 tablet; Refill: 1 -  confirmed current Magnesium 500 MG TABS; Take 1 tablet 2 to 4 x day as directed - CBC with Differential/Platelet - BASIC METABOLIC PANEL WITH GFR - Magnesium

## 2015-10-17 ENCOUNTER — Other Ambulatory Visit: Payer: Self-pay | Admitting: Physician Assistant

## 2015-10-17 LAB — URINALYSIS, MICROSCOPIC ONLY
CASTS: NONE SEEN [LPF]
CRYSTALS: NONE SEEN [HPF]
Squamous Epithelial / LPF: NONE SEEN [HPF] (ref <=5)
WBC, UA: >60 WBC/HPF — AB (ref <=5)
YEAST: NONE SEEN [HPF]

## 2015-10-17 LAB — URINALYSIS, ROUTINE W REFLEX MICROSCOPIC
Bilirubin Urine: NEGATIVE
KETONES UR: NEGATIVE
NITRITE: NEGATIVE
PH: 5 (ref 5.0–8.0)
Specific Gravity, Urine: 1.012 (ref 1.001–1.035)

## 2015-10-18 ENCOUNTER — Telehealth: Payer: Self-pay | Admitting: *Deleted

## 2015-10-18 ENCOUNTER — Emergency Department (HOSPITAL_COMMUNITY): Payer: 59

## 2015-10-18 ENCOUNTER — Other Ambulatory Visit: Payer: Self-pay | Admitting: Internal Medicine

## 2015-10-18 ENCOUNTER — Inpatient Hospital Stay (HOSPITAL_COMMUNITY)
Admission: EM | Admit: 2015-10-18 | Discharge: 2015-10-20 | DRG: 872 | Disposition: A | Discharge status: Home / Self Care | Payer: 59 | Attending: Internal Medicine | Admitting: Internal Medicine

## 2015-10-18 ENCOUNTER — Encounter (HOSPITAL_COMMUNITY): Payer: Self-pay

## 2015-10-18 DIAGNOSIS — N179 Acute kidney failure, unspecified: Secondary | ICD-10-CM | POA: Diagnosis present

## 2015-10-18 DIAGNOSIS — N182 Chronic kidney disease, stage 2 (mild): Secondary | ICD-10-CM | POA: Diagnosis present

## 2015-10-18 DIAGNOSIS — K219 Gastro-esophageal reflux disease without esophagitis: Secondary | ICD-10-CM | POA: Diagnosis present

## 2015-10-18 DIAGNOSIS — Z8249 Family history of ischemic heart disease and other diseases of the circulatory system: Secondary | ICD-10-CM | POA: Diagnosis not present

## 2015-10-18 DIAGNOSIS — E871 Hypo-osmolality and hyponatremia: Secondary | ICD-10-CM | POA: Diagnosis present

## 2015-10-18 DIAGNOSIS — J320 Chronic maxillary sinusitis: Secondary | ICD-10-CM | POA: Diagnosis present

## 2015-10-18 DIAGNOSIS — Z825 Family history of asthma and other chronic lower respiratory diseases: Secondary | ICD-10-CM

## 2015-10-18 DIAGNOSIS — Z923 Personal history of irradiation: Secondary | ICD-10-CM | POA: Diagnosis not present

## 2015-10-18 DIAGNOSIS — B9620 Unspecified Escherichia coli [E. coli] as the cause of diseases classified elsewhere: Secondary | ICD-10-CM | POA: Diagnosis present

## 2015-10-18 DIAGNOSIS — Z87891 Personal history of nicotine dependence: Secondary | ICD-10-CM | POA: Diagnosis not present

## 2015-10-18 DIAGNOSIS — K8689 Other specified diseases of pancreas: Secondary | ICD-10-CM | POA: Diagnosis present

## 2015-10-18 DIAGNOSIS — I10 Essential (primary) hypertension: Secondary | ICD-10-CM

## 2015-10-18 DIAGNOSIS — N189 Chronic kidney disease, unspecified: Secondary | ICD-10-CM | POA: Diagnosis not present

## 2015-10-18 DIAGNOSIS — J439 Emphysema, unspecified: Secondary | ICD-10-CM | POA: Diagnosis present

## 2015-10-18 DIAGNOSIS — R160 Hepatomegaly, not elsewhere classified: Secondary | ICD-10-CM | POA: Diagnosis present

## 2015-10-18 DIAGNOSIS — Z79899 Other long term (current) drug therapy: Secondary | ICD-10-CM | POA: Diagnosis not present

## 2015-10-18 DIAGNOSIS — C221 Intrahepatic bile duct carcinoma: Secondary | ICD-10-CM | POA: Diagnosis present

## 2015-10-18 DIAGNOSIS — E860 Dehydration: Secondary | ICD-10-CM | POA: Diagnosis present

## 2015-10-18 DIAGNOSIS — Z888 Allergy status to other drugs, medicaments and biological substances status: Secondary | ICD-10-CM | POA: Diagnosis not present

## 2015-10-18 DIAGNOSIS — Z800 Family history of malignant neoplasm of digestive organs: Secondary | ICD-10-CM | POA: Diagnosis not present

## 2015-10-18 DIAGNOSIS — E039 Hypothyroidism, unspecified: Secondary | ICD-10-CM | POA: Diagnosis present

## 2015-10-18 DIAGNOSIS — H538 Other visual disturbances: Secondary | ICD-10-CM | POA: Diagnosis present

## 2015-10-18 DIAGNOSIS — Z9884 Bariatric surgery status: Secondary | ICD-10-CM

## 2015-10-18 DIAGNOSIS — H6690 Otitis media, unspecified, unspecified ear: Secondary | ICD-10-CM | POA: Diagnosis present

## 2015-10-18 DIAGNOSIS — Z7952 Long term (current) use of systemic steroids: Secondary | ICD-10-CM | POA: Diagnosis not present

## 2015-10-18 DIAGNOSIS — E785 Hyperlipidemia, unspecified: Secondary | ICD-10-CM | POA: Diagnosis present

## 2015-10-18 DIAGNOSIS — J45909 Unspecified asthma, uncomplicated: Secondary | ICD-10-CM | POA: Diagnosis present

## 2015-10-18 DIAGNOSIS — Z833 Family history of diabetes mellitus: Secondary | ICD-10-CM

## 2015-10-18 DIAGNOSIS — Z7984 Long term (current) use of oral hypoglycemic drugs: Secondary | ICD-10-CM

## 2015-10-18 DIAGNOSIS — N390 Urinary tract infection, site not specified: Secondary | ICD-10-CM | POA: Diagnosis present

## 2015-10-18 DIAGNOSIS — Z820 Family history of epilepsy and other diseases of the nervous system: Secondary | ICD-10-CM

## 2015-10-18 DIAGNOSIS — F419 Anxiety disorder, unspecified: Secondary | ICD-10-CM | POA: Diagnosis present

## 2015-10-18 DIAGNOSIS — Z9049 Acquired absence of other specified parts of digestive tract: Secondary | ICD-10-CM | POA: Diagnosis not present

## 2015-10-18 DIAGNOSIS — E876 Hypokalemia: Secondary | ICD-10-CM | POA: Diagnosis present

## 2015-10-18 DIAGNOSIS — E1165 Type 2 diabetes mellitus with hyperglycemia: Secondary | ICD-10-CM | POA: Diagnosis present

## 2015-10-18 DIAGNOSIS — I129 Hypertensive chronic kidney disease with stage 1 through stage 4 chronic kidney disease, or unspecified chronic kidney disease: Secondary | ICD-10-CM | POA: Diagnosis present

## 2015-10-18 DIAGNOSIS — A419 Sepsis, unspecified organism: Secondary | ICD-10-CM | POA: Diagnosis present

## 2015-10-18 DIAGNOSIS — E1129 Type 2 diabetes mellitus with other diabetic kidney complication: Secondary | ICD-10-CM | POA: Diagnosis present

## 2015-10-18 DIAGNOSIS — Z6824 Body mass index (BMI) 24.0-24.9, adult: Secondary | ICD-10-CM | POA: Diagnosis not present

## 2015-10-18 DIAGNOSIS — E440 Moderate protein-calorie malnutrition: Secondary | ICD-10-CM | POA: Diagnosis present

## 2015-10-18 DIAGNOSIS — Z90710 Acquired absence of both cervix and uterus: Secondary | ICD-10-CM

## 2015-10-18 DIAGNOSIS — C240 Malignant neoplasm of extrahepatic bile duct: Secondary | ICD-10-CM | POA: Diagnosis present

## 2015-10-18 DIAGNOSIS — E1122 Type 2 diabetes mellitus with diabetic chronic kidney disease: Secondary | ICD-10-CM | POA: Diagnosis present

## 2015-10-18 DIAGNOSIS — R918 Other nonspecific abnormal finding of lung field: Secondary | ICD-10-CM | POA: Diagnosis present

## 2015-10-18 DIAGNOSIS — R42 Dizziness and giddiness: Secondary | ICD-10-CM | POA: Diagnosis not present

## 2015-10-18 DIAGNOSIS — R739 Hyperglycemia, unspecified: Secondary | ICD-10-CM

## 2015-10-18 DIAGNOSIS — N181 Chronic kidney disease, stage 1: Secondary | ICD-10-CM

## 2015-10-18 DIAGNOSIS — R531 Weakness: Secondary | ICD-10-CM | POA: Diagnosis not present

## 2015-10-18 LAB — URINE MICROSCOPIC-ADD ON: Squamous Epithelial / LPF: NONE SEEN

## 2015-10-18 LAB — URINALYSIS, ROUTINE W REFLEX MICROSCOPIC
BILIRUBIN URINE: NEGATIVE
Glucose, UA: >1000 mg/dL — AB
KETONES UR: NEGATIVE mg/dL
NITRITE: NEGATIVE
PH: 5.5 (ref 5.0–8.0)
Protein, ur: NEGATIVE mg/dL
SPECIFIC GRAVITY, URINE: 1.015 (ref 1.005–1.030)

## 2015-10-18 LAB — CBC
HEMATOCRIT: 37.3 % (ref 36.0–46.0)
Hemoglobin: 12.4 g/dL (ref 12.0–15.0)
MCH: 29.9 pg (ref 26.0–34.0)
MCHC: 33.2 g/dL (ref 30.0–36.0)
MCV: 89.9 fL (ref 78.0–100.0)
PLATELETS: 236 10*3/uL (ref 150–400)
RBC: 4.15 MIL/uL (ref 3.87–5.11)
RDW: 14.2 % (ref 11.5–15.5)
WBC: 14.5 10*3/uL — AB (ref 4.0–10.5)

## 2015-10-18 LAB — GLUCOSE, CAPILLARY: Glucose-Capillary: 298 mg/dL — ABNORMAL HIGH (ref 65–99)

## 2015-10-18 LAB — BASIC METABOLIC PANEL
Anion gap: 14 (ref 5–15)
BUN: 59 mg/dL — AB (ref 6–20)
CHLORIDE: 83 mmol/L — AB (ref 101–111)
CO2: 26 mmol/L (ref 22–32)
CREATININE: 3.12 mg/dL — AB (ref 0.44–1.00)
Calcium: 8.6 mg/dL — ABNORMAL LOW (ref 8.9–10.3)
GFR calc Af Amer: 18 mL/min — ABNORMAL LOW (ref >60)
GFR, EST NON AFRICAN AMERICAN: 15 mL/min — AB (ref >60)
GLUCOSE: 620 mg/dL — AB (ref 65–99)
Potassium: 4.5 mmol/L (ref 3.5–5.1)
SODIUM: 123 mmol/L — AB (ref 135–145)

## 2015-10-18 LAB — LACTIC ACID, PLASMA: Lactic Acid, Venous: 4.1 mmol/L (ref 0.5–2.0)

## 2015-10-18 LAB — PROTIME-INR
INR: 1.02 (ref 0.00–1.49)
Prothrombin Time: 13.6 seconds (ref 11.6–15.2)

## 2015-10-18 LAB — CBG MONITORING, ED: Glucose-Capillary: 326 mg/dL — ABNORMAL HIGH (ref 65–99)

## 2015-10-18 MED ORDER — INSULIN ASPART 100 UNIT/ML ~~LOC~~ SOLN
0.0000 [IU] | Freq: Three times a day (TID) | SUBCUTANEOUS | Status: DC
  Administered 2015-10-19: 3 [IU] via SUBCUTANEOUS
  Administered 2015-10-19: 5 [IU] via SUBCUTANEOUS
  Administered 2015-10-19: 3 [IU] via SUBCUTANEOUS
  Administered 2015-10-20: 5 [IU] via SUBCUTANEOUS

## 2015-10-18 MED ORDER — PANCRELIPASE (LIP-PROT-AMYL) 12000-38000 UNITS PO CPEP
12000.0000 [IU] | ORAL_CAPSULE | Freq: Three times a day (TID) | ORAL | Status: DC
  Administered 2015-10-18 – 2015-10-20 (×5): 12000 [IU] via ORAL
  Filled 2015-10-18 (×7): qty 1

## 2015-10-18 MED ORDER — CALCIUM CITRATE 950 (200 CA) MG PO TABS
200.0000 mg | ORAL_TABLET | Freq: Three times a day (TID) | ORAL | Status: DC
  Administered 2015-10-19 – 2015-10-20 (×4): 200 mg via ORAL
  Filled 2015-10-18 (×7): qty 1

## 2015-10-18 MED ORDER — LEVOTHYROXINE SODIUM 200 MCG PO TABS
200.0000 ug | ORAL_TABLET | ORAL | Status: DC
  Administered 2015-10-19: 200 ug via ORAL
  Filled 2015-10-18: qty 1
  Filled 2015-10-18: qty 2

## 2015-10-18 MED ORDER — SODIUM CHLORIDE 0.9 % IJ SOLN
3.0000 mL | Freq: Two times a day (BID) | INTRAMUSCULAR | Status: DC
  Administered 2015-10-18 – 2015-10-19 (×2): 3 mL via INTRAVENOUS

## 2015-10-18 MED ORDER — MAGNESIUM 500 MG PO TABS: 1000.0000 mg | ORAL_TABLET | Freq: Every day | ORAL | Status: DC

## 2015-10-18 MED ORDER — SUCRALFATE 1 G PO TABS
1.0000 g | ORAL_TABLET | Freq: Three times a day (TID) | ORAL | Status: DC
  Administered 2015-10-19 – 2015-10-20 (×5): 1 g via ORAL
  Filled 2015-10-18 (×10): qty 1

## 2015-10-18 MED ORDER — MAGNESIUM GLUCONATE 500 MG PO TABS
1000.0000 mg | ORAL_TABLET | Freq: Every day | ORAL | Status: DC
  Administered 2015-10-19 – 2015-10-20 (×2): 1000 mg via ORAL
  Filled 2015-10-18 (×2): qty 2

## 2015-10-18 MED ORDER — SODIUM CHLORIDE 0.9 % IV BOLUS (SEPSIS)
1000.0000 mL | Freq: Once | INTRAVENOUS | Status: AC
  Administered 2015-10-18: 1000 mL via INTRAVENOUS

## 2015-10-18 MED ORDER — HYDRALAZINE HCL 20 MG/ML IJ SOLN: 5.0000 mg | INTRAMUSCULAR | Status: DC | PRN

## 2015-10-18 MED ORDER — ALPRAZOLAM 0.5 MG PO TABS
0.5000 mg | ORAL_TABLET | Freq: Three times a day (TID) | ORAL | Status: DC | PRN
  Administered 2015-10-18 – 2015-10-19 (×2): 0.5 mg via ORAL
  Filled 2015-10-18 (×2): qty 1

## 2015-10-18 MED ORDER — LEVOTHYROXINE SODIUM 100 MCG PO TABS
100.0000 ug | ORAL_TABLET | ORAL | Status: DC
  Administered 2015-10-20: 100 ug via ORAL
  Filled 2015-10-18: qty 1

## 2015-10-18 MED ORDER — FLUTICASONE PROPIONATE 50 MCG/ACT NA SUSP: 2.0000 | Freq: Every day | NASAL | Status: DC | PRN

## 2015-10-18 MED ORDER — CALCIUM CITRATE-VITAMIN D 315-200 MG-UNIT PO TABS: 1.0000 | ORAL_TABLET | Freq: Three times a day (TID) | ORAL | Status: DC

## 2015-10-18 MED ORDER — PREDNISONE 20 MG PO TABS
40.0000 mg | ORAL_TABLET | Freq: Every day | ORAL | Status: DC
  Administered 2015-10-19 – 2015-10-20 (×2): 40 mg via ORAL
  Filled 2015-10-18 (×3): qty 2

## 2015-10-18 MED ORDER — SODIUM CHLORIDE 0.9 % IV SOLN
INTRAVENOUS | Status: DC
  Administered 2015-10-19: 01:00:00 via INTRAVENOUS
  Administered 2015-10-19: 100 mL/h via INTRAVENOUS
  Administered 2015-10-20: 02:00:00 via INTRAVENOUS

## 2015-10-18 MED ORDER — INSULIN ASPART 100 UNIT/ML ~~LOC~~ SOLN
10.0000 [IU] | Freq: Once | SUBCUTANEOUS | Status: AC
  Administered 2015-10-18: 10 [IU] via SUBCUTANEOUS
  Filled 2015-10-18: qty 1

## 2015-10-18 MED ORDER — ADULT MULTIVITAMIN W/MINERALS CH
1.0000 | ORAL_TABLET | Freq: Every day | ORAL | Status: DC
  Administered 2015-10-19 – 2015-10-20 (×2): 1 via ORAL
  Filled 2015-10-18 (×2): qty 1

## 2015-10-18 MED ORDER — LORATADINE 10 MG PO TABS
10.0000 mg | ORAL_TABLET | Freq: Every day | ORAL | Status: DC
  Administered 2015-10-19 – 2015-10-20 (×2): 10 mg via ORAL
  Filled 2015-10-18 (×3): qty 1

## 2015-10-18 MED ORDER — ALBUTEROL SULFATE (2.5 MG/3ML) 0.083% IN NEBU: 2.5000 mg | INHALATION_SOLUTION | RESPIRATORY_TRACT | Status: DC | PRN

## 2015-10-18 MED ORDER — ONDANSETRON HCL 4 MG/2ML IJ SOLN: 4.0000 mg | Freq: Four times a day (QID) | INTRAMUSCULAR | Status: DC | PRN

## 2015-10-18 MED ORDER — DEXTROSE 5 % IV SOLN
1.0000 g | INTRAVENOUS | Status: DC
  Administered 2015-10-18 – 2015-10-19 (×2): 1 g via INTRAVENOUS
  Filled 2015-10-18 (×2): qty 10

## 2015-10-18 MED ORDER — HEPARIN SODIUM (PORCINE) 5000 UNIT/ML IJ SOLN
5000.0000 [IU] | Freq: Three times a day (TID) | INTRAMUSCULAR | Status: DC
  Administered 2015-10-18 – 2015-10-20 (×5): 5000 [IU] via SUBCUTANEOUS
  Filled 2015-10-18 (×8): qty 1

## 2015-10-18 MED ORDER — FERROUS SULFATE 325 (65 FE) MG PO TABS
325.0000 mg | ORAL_TABLET | Freq: Every day | ORAL | Status: DC
  Administered 2015-10-19 – 2015-10-20 (×2): 325 mg via ORAL
  Filled 2015-10-18 (×3): qty 1

## 2015-10-18 MED ORDER — MULTI-VITAMIN/MINERALS PO TABS: 1.0000 | ORAL_TABLET | Freq: Every day | ORAL | Status: DC

## 2015-10-18 MED ORDER — PANTOPRAZOLE SODIUM 40 MG PO TBEC
40.0000 mg | DELAYED_RELEASE_TABLET | Freq: Every day | ORAL | Status: DC
  Administered 2015-10-19 – 2015-10-20 (×2): 40 mg via ORAL
  Filled 2015-10-18 (×2): qty 1

## 2015-10-18 MED ORDER — ALBUTEROL SULFATE HFA 108 (90 BASE) MCG/ACT IN AERS: 2.0000 | INHALATION_SPRAY | RESPIRATORY_TRACT | Status: DC | PRN

## 2015-10-18 MED ORDER — ONDANSETRON HCL 4 MG PO TABS: 4.0000 mg | ORAL_TABLET | Freq: Four times a day (QID) | ORAL | Status: DC | PRN

## 2015-10-18 MED ORDER — INSULIN GLARGINE 100 UNIT/ML ~~LOC~~ SOLN
5.0000 [IU] | Freq: Every day | SUBCUTANEOUS | Status: DC
  Administered 2015-10-18 – 2015-10-19 (×2): 5 [IU] via SUBCUTANEOUS
  Filled 2015-10-18 (×2): qty 0.05

## 2015-10-18 NOTE — H&P (Addendum)
[facility] History and Physical  KEONTAE CH [id number] DOB: 05/08/1956 DOA: 10/18/2015  Referring physician: ED physician PCP: [name], MD  Specialists:   Chief Complaint: Dysuria, generalized weakness, dizziness, right ear muffling  HPI: Courtney Stevens is a 59 y.o. female with PMH of hypertension, hyperlipidemia, diabetes mellitus, asthma, GERD, hypothyroidism, anxiety, CKD-2, hepatic necrotizing granuloma, biliary duct cancer 2009, who presents with dysuria, generalized weakness, dizziness and right ear muffling.  Patient reports that she has been having generalized weakness for about one week. She has poor balance and dizziness. No unilateral weakness or numbness in extremities. Occasionally she has bluury vision, currently no vision change. She also reports right ear muffling, but no ear discharge or ear pain. She has dysuria and burning on urination recently. Patient was seen by her PCP on 10/16/15, and was given prescription of prednisone and Levaquin for UTI, possible maxillary sinusitis and otitis media. Patient reports that she has taken 3 pills of Levaquin without improvement in her urinary symptoms. She still has right ear muffling and generalized weakness. Patient does not have chest pain, fever, chills, shortness of breath, cough, abdominal pain. She reports that she had two bowel movement with loose stool today. She is taking Creon for pancreatic insufficiency.  In ED, patient was found to have WBC 14.5, positive urinalysis with trace amount of leukocytes, temperature normal, no tachycardia, worsening renal function, negative chest x-ray for acute abnormalities, elevated sugar at 620 with AG 14. Patient's admitted to inpatient for further evaluation and treatment.  Where does patient live?   At home   Can patient participate in ADLs?  Yes   Review of Systems:   General: no fevers, chills, no changes in body weight, has poor appetite, has fatigue HEENT:  had blurry vision and ear mufling, no sore throat Pulm: no dyspnea, coughing, wheezing CV: no chest pain, palpitations Abd: no nausea, vomiting, abdominal pain, had loose stool, no constipation GU: has dysuria, burning on urination, increased urinary frequency, no hematuria  Ext: no leg edema Neuro: no unilateral weakness, numbness, or tingling. Skin: no rash MSK: No muscle spasm, no deformity, no limitation of range of movement in spin Heme: No easy bruising.  Travel history: No recent long distant travel.  Allergy:  Allergies  Allergen Reactions  . Ace Inhibitors Anaphylaxis  . Tilade [Nedocromil] Anaphylaxis  . Reglan [Metoclopramide] Nausea And Vomiting  . Betadine [Povidone Iodine] Rash    iching     Past Medical History  Diagnosis Date  . GERD (gastroesophageal reflux disease)   . Diabetes mellitus   . Hypertension   . Hypothyroid   . Hyperlipidemia   . Asthma   . Hepatitis   . Anxiety   . Anemia     History of blood transfusion which is when pt reports being exposed to hepatitis in 2009.  Marland Kitchen Cancer ([place])     [place] bile duct ca (2009)   . CKD (chronic kidney disease), stage II     Past Surgical History  Procedure Laterality Date  . Gastric bypass  2004  . Cholecystectomy  1992  . Bile duct resection  2009  . Appendectomy    . Tonsillectomy    . Abdominal hysterectomy  1990    BIL OOPHORECTOMY  . Tubal ligation    . Breast surgery      BIOPSY NEG  . Bunionectomy Right 2005  . Video bronchoscopy with endobronchial ultrasound N/A 10/12/2014    Procedure: VIDEO BRONCHOSCOPY WITH ENDOBRONCHIAL ULTRASOUND;  Surgeon:  Brand Males, MD;  Location: [place] OR;  Service: Thoracic;  Laterality: N/A;    Social History:  reports that she quit smoking about 37 years ago. Her smoking use included Cigarettes. She has a 10 pack-year smoking history. She has never used smokeless tobacco. She reports that she drinks alcohol. She reports that she does not use illicit  drugs.  Family History:  Family History  Problem Relation Age of Onset  . Colon cancer Mother   . Hypertension Mother   . Glaucoma Mother   . Diabetes Father   . Alzheimer's disease Father   . Heart attack Father   . Nephrolithiasis Father   . Emphysema Father     was a smoker  . Heart disease Father   . Nephrolithiasis Sister   . Heart attack Brother   . Nephrolithiasis Brother      Prior to Admission medications   Medication Sig Start Date End Date Taking? Authorizing Provider  albuterol (VENTOLIN HFA) 108 (90 BASE) MCG/ACT inhaler Inhale 2 puffs into the lungs every 4 (four) hours as needed for wheezing or shortness of breath. 09/08/15  Yes Vicie Mutters, PA-C  ALPRAZolam (XANAX) 1 MG tablet TAKE 1/2 TO 1 TABLET 3 TIMES A DAY Patient taking differently: TAKE 1/2 TO 1 TABLET 3 TIMES A DAY AS NEEDED FOR ANXIETY 10/11/15  Yes Unk Pinto, MD  calcium citrate-vitamin D (CITRACAL+D) 315-200 MG-UNIT per tablet Take 1 tablet by mouth 3 (three) times daily.   Yes Historical Provider, MD  cetirizine (ZYRTEC) 10 MG tablet Take 10 mg by mouth every morning.     Yes Historical Provider, MD  DEXILANT 60 MG capsule Take 60 mg by mouth daily.  05/15/15  Yes Historical Provider, MD  ferrous sulfate 325 (65 FE) MG tablet Take 325 mg by mouth daily with breakfast.   Yes Historical Provider, MD  fluticasone (FLONASE) 50 MCG/ACT nasal spray Place 2 sprays into both nostrils daily. Patient taking differently: Place 2 sprays into both nostrils daily as needed for allergies.  12/01/14  Yes Brand Males, MD  furosemide (LASIX) 40 MG tablet Take 1 to 2 tablets 2 x day for fluid & swelling Patient taking differently: Take 80 mg by mouth daily.  10/16/15 10/15/16 Yes Unk Pinto, MD  levofloxacin (LEVAQUIN) 500 MG tablet Take 1 tablet (500 mg total) by mouth daily. 10/16/15  Yes Unk Pinto, MD  levothyroxine (SYNTHROID, LEVOTHROID) 200 MCG tablet Take 100-200 mcg by mouth daily before  breakfast. 0.5 - 1 tablet daily alternate 100 mg with 200 mg   Yes Historical Provider, MD  lipase/protease/amylase (CREON) 12000 UNITS CPEP capsule Take 1 capsule (12,000 Units total) by mouth 3 (three) times daily. 03/23/15  Yes Unk Pinto, MD  Magnesium 500 MG TABS Take 1 tablet 2 to 4 x day as directed Patient taking differently: Take 1,000 mg by mouth daily.  10/16/15 10/15/16 Yes Unk Pinto, MD  metFORMIN (GLUCOPHAGE) 500 MG tablet Take 1-2 tablets (500-1,000 mg total) by mouth 3 (three) times daily. Take 1 tablet at breakfast, 1 tablet at lunch, and 2 tablets at dinner 03/23/15  Yes Unk Pinto, MD  Multiple Vitamins-Minerals (MULTIVITAMIN WITH MINERALS) tablet Take 1 tablet by mouth daily.    Yes Historical Provider, MD  ONGLYZA 5 MG TABS tablet Take 5 mg by mouth daily. 05/16/15  Yes Historical Provider, MD  potassium chloride SA (K-DUR,KLOR-CON) 20 MEQ tablet Take 20 mEq by mouth 4 (four) times daily.    Yes Historical Provider, MD  predniSONE (  DELTASONE) 20 MG tablet 1 tab 3 x day for 3 days, then 1 tab 2 x day for 3 days, then 1 tab 1 x day for 5 days 10/16/15  Yes Unk Pinto, MD  sucralfate (CARAFATE) 1 G tablet TAKE 1 TABLET BY MOUTH 4 TIMES DAILY. 10/17/15  Yes Unk Pinto, MD  Vitamin D, Ergocalciferol, (DRISDOL) 50000 UNITS CAPS capsule Take 1 capsule (50,000 Units total) by mouth daily. 03/23/15  Yes Unk Pinto, MD    Physical Exam: Filed Vitals:   10/18/15 1818 10/18/15 2000 10/18/15 2040 10/18/15 2123  BP: 150/68 95/60 118/84 126/76  Pulse: 80 75 78 73  Temp:    97.2 F (36.2 C)  TempSrc:    Oral  Resp: 18 19 16 16   Weight:      SpO2: 95% 99% 98% 98%   General: Not in acute distress. Dry mucus and membrane. HEENT:       Eyes: PERRL, EOMI, no scleral icterus.       ENT: No discharge from the ears and nose, no pharynx injection, no tonsillar enlargement.        Neck: No JVD, no bruit, no mass felt. Heme: No neck lymph node enlargement. Cardiac:  S1/S2, RRR, No murmurs, No gallops or rubs. Pulm: No rales, wheezing, rhonchi or rubs. Abd: Soft, nondistended, nontender, no rebound pain, no organomegaly, BS present. Ext: No pitting leg edema bilaterally. 2+DP/PT pulse bilaterally. Musculoskeletal: No joint deformities, No joint redness or warmth, no limitation of ROM in spin. Skin: No rashes.  Neuro: Alert, oriented X3, cranial nerves II-XII grossly intact, muscle strength symmetrical 4/5 in all extremities, sensation to light touch intact. Brachial reflex 1+ bilaterally. Negative Babinski's sign. Normal finger to nose test. Psych: Patient is not psychotic, no suicidal or hemocidal ideation.  Labs on Admission:  Basic Metabolic Panel:  Recent Labs Lab 10/16/15 1228 10/18/15 1647  NA 125* 123*  K 4.1 4.5  CL 83* 83*  CO2 28 26  GLUCOSE 391* 620*  BUN 37* 59*  CREATININE 2.74* 3.12*  CALCIUM 8.5* 8.6*  MG 2.0  --    Liver Function Tests: No results for input(s): AST, ALT, ALKPHOS, BILITOT, PROT, ALBUMIN in the last 168 hours. No results for input(s): LIPASE, AMYLASE in the last 168 hours. No results for input(s): AMMONIA in the last 168 hours. CBC:  Recent Labs Lab 10/16/15 1228 10/18/15 1647  WBC 15.8* 14.5*  NEUTROABS 13.6*  --   HGB 11.3* 12.4  HCT 34.0* 37.3  MCV 90.2 89.9  PLT 164 236   Cardiac Enzymes: No results for input(s): CKTOTAL, CKMB, CKMBINDEX, TROPONINI in the last 168 hours.  BNP (last 3 results) No results for input(s): BNP in the last 8760 hours.  ProBNP (last 3 results) No results for input(s): PROBNP in the last 8760 hours.  CBG:  Recent Labs Lab 10/18/15 2016  GLUCAP 326*    Radiological Exams on Admission: Dg Chest 2 View  10/18/2015  CLINICAL DATA:  Generalized weakness, dizziness, lethargy. History of cholangiocarcinoma. EXAM: CHEST  2 VIEW COMPARISON:  09/02/2014, 03/01/2014 FINDINGS: Stable heart size and vascularity. Mild background emphysema suspected. Lungs remain clear. No  focal pneumonia, collapse or consolidation. Negative for edema, effusion, or pneumothorax. Trachea midline. Degenerative changes of the spine. IMPRESSION: Stable chest exam.  No superimposed acute process Electronically Signed   By: Jerilynn Mages.  Shick M.D.   On: 10/18/2015 18:42    EKG: Independently reviewed. QTC 428, no ischemic changes.   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 -check Mg level  Generalized weakness: most likely due to deconditioning secondary to multiple comorbidities, including sepsis, worsening cancer, hyperglycemia, UTI, protein calorie more nutrition, dehydration and worsening renal function. -admit to med-surg bed -treat underlying problems, particularly UTI and dehydration. -IVF: 3L NS bolus and then 100 cc/h -pt/ot  Dizziness: likely due to orthostatic status 2/2 dehydration, but the patient also has ear muffling and occasional blurry vision, given her history of worsening biliary duct cancer, will need to rule out metastasized disease. -Check orthostatic vital signs -IV fluid as above -CT head without contrast  UTI: pt has taken levaquine for 3 days without inprovement in her symptoms. -will switch to IV rocephin -follow up Bx and Ux  Addendum: Sepsis: her lactate level is elevated at 4.1, now she meets the criterior of sepsis with leukocytosis and elevated lactate. Hemodynamically stable now. -Continue IVF as above, 3L NS bolus and then 100 cc/h -will get Procalcitonin and  trend lactic acid levels per sepsis protocol.  Loose stool: it is likely due to pancreas insufficiency, but will r/o c diff colitis since pt is septic. -continue Creon -check c diff pcr  Possible maxillary sinusitis and otitis media per PCP: -On Rocephin -Continue prednisone per PCP  Malignant neoplasm of extrahepatic bile duct ([place]): Diagnosed 2009, followed up with Dr. Leamon Arnt in [place]. She is s/p of surgery, chemotherapy and radiation therapy 2009. She was last seen by Dr. Leamon Arnt on 10/02/15. Per Dr. Darin Engels note, pt has liver mass and retroperitoneal LAN. CT scans showed slight growth of a peritoneal nodule, which has been slow in growth. Suggested that pt RTC 3 months with C/A/P CT scan, CBC, CMP, CEA CEA. -she has appointment with Dr. Leamon Arnt on 01/04/15.    DM-II: Last A1c 7.7 on 07/25/15, not well controled. Patient is taking metformin and Tradjenta at home. Blood sugar in 620 with normal AG.  -will start lantus 5 units daily -SSI  Essential hypertension: -hold lasix due to worsening renal function and dehydration -IV hydralazine when necessary  Intrinsic asthma: stable -continue Albuterol inhaler  Hypothyroidism: Last TSH was 3.006 on 9/616. -Continue home Synthroid  GERD: -Protonix and carafate  AoCKD-II: Baseline Cre is 1.02 on 07/25/15, her Cre is 3.12 on admission. Likely due to prerenal secondary to dehydration and continuation of diruetics. - IVF as above - Check FeUrea - US-renal - Follow up renal function by BMP - Hold Diuretics, lasix  Protein-calorie malnutrition, moderate ([place]): -consult to nutrition team   DVT ppx: SQ Heparin   Code Status: Full code Family Communication: Yes, patient's husband at bed side Disposition Plan: Admit to inpatient   Date of Service 10/18/2015    Ivor Costa [facility] Pager [phone]  If 7PM-7AM, please contact night-coverage [email or web address] Password [email or web address] 10/18/2015, 9:26 PM

## 2015-10-18 NOTE — ED Provider Notes (Signed)
CSN: [id number]     Arrival date & time 10/18/15  1518 History   First MD Initiated Contact with Patient 10/18/15 1748     Chief Complaint  Patient presents with  . Weakness    HPI   [name] is an 59 y.o. female with history of bile duct cancer, DM, HTN, HLD, hypothyroidism who presents to the ED for evaluation of weakness. She states she has been feeling very weak, fatigued, and "just not right" since one week ago.She reports that for the p She states that she was seen by her PCP two days ago and started on Levaquin for UTI. However she states she continues to feel very weak and fatigued. States that at home sometimes she feels confused or unable to get around. Denies any chest pain, SOB, or LOC. She is unsure if she has had fevers at home. Denies dysuria, urinary frequency, or urgency. Denies abdominal pain, N/v/D. She does endorse decreased appetitie.   Past Medical History  Diagnosis Date  . GERD (gastroesophageal reflux disease)   . Diabetes mellitus   . Hypertension   . Hypothyroid   . Hyperlipidemia   . Asthma   . Hepatitis   . Anxiety   . Anemia     History of blood transfusion which is when pt reports being exposed to hepatitis in 2009.  Marland Kitchen Cancer ([place])     [place] bile duct ca (2009)    Past Surgical History  Procedure Laterality Date  . Gastric bypass  2004  . Cholecystectomy  1992  . Bile duct resection  2009  . Appendectomy    . Tonsillectomy    . Abdominal hysterectomy  1990    BIL OOPHORECTOMY  . Tubal ligation    . Breast surgery      BIOPSY NEG  . Bunionectomy Right 2005  . Video bronchoscopy with endobronchial ultrasound N/A 10/12/2014    Procedure: VIDEO BRONCHOSCOPY WITH ENDOBRONCHIAL ULTRASOUND;  Surgeon: Brand Males, MD;  Location: [facility] OR;  Service: Thoracic;  Laterality: N/A;   Family History  Problem Relation Age of Onset  . Colon cancer Mother   . Hypertension Mother   . Glaucoma Mother   . Diabetes Father   . Alzheimer's disease Father    . Heart attack Father   . Nephrolithiasis Father   . Emphysema Father     was a smoker  . Heart disease Father   . Nephrolithiasis Sister   . Heart attack Brother   . Nephrolithiasis Brother    Social History  Substance Use Topics  . Smoking status: Former Smoker -- 2.00 packs/day for 5 years    Types: Cigarettes    Quit date: 11/18/1977  . Smokeless tobacco: Never Used  . Alcohol Use: Yes     Comment: 2-3 glasses of wine once per month    OB History    No data available     Review of Systems  All other systems reviewed and are negative.     Allergies  Ace inhibitors; Tilade; Reglan; and Betadine  Home Medications   Prior to Admission medications   Medication Sig Start Date End Date Taking? Authorizing Provider  albuterol (VENTOLIN HFA) 108 (90 BASE) MCG/ACT inhaler Inhale 2 puffs into the lungs every 4 (four) hours as needed for wheezing or shortness of breath. 09/08/15  Yes Vicie Mutters, PA-C  ALPRAZolam (XANAX) 1 MG tablet TAKE 1/2 TO 1 TABLET 3 TIMES A DAY Patient taking differently: TAKE 1/2 TO 1 TABLET 3 TIMES  A DAY AS NEEDED FOR ANXIETY 10/11/15  Yes Unk Pinto, MD  calcium citrate-vitamin D (CITRACAL+D) 315-200 MG-UNIT per tablet Take 1 tablet by mouth 3 (three) times daily.   Yes Historical Provider, MD  cetirizine (ZYRTEC) 10 MG tablet Take 10 mg by mouth every morning.     Yes Historical Provider, MD  DEXILANT 60 MG capsule Take 60 mg by mouth daily.  05/15/15  Yes Historical Provider, MD  ferrous sulfate 325 (65 FE) MG tablet Take 325 mg by mouth daily with breakfast.   Yes Historical Provider, MD  fluticasone (FLONASE) 50 MCG/ACT nasal spray Place 2 sprays into both nostrils daily. Patient taking differently: Place 2 sprays into both nostrils daily as needed for allergies.  12/01/14  Yes Brand Males, MD  furosemide (LASIX) 40 MG tablet Take 1 to 2 tablets 2 x day for fluid & swelling Patient taking differently: Take 80 mg by mouth daily.  10/16/15  10/15/16 Yes Unk Pinto, MD  levofloxacin (LEVAQUIN) 500 MG tablet Take 1 tablet (500 mg total) by mouth daily. 10/16/15  Yes Unk Pinto, MD  levothyroxine (SYNTHROID, LEVOTHROID) 200 MCG tablet Take 100-200 mcg by mouth daily before breakfast. 0.5 - 1 tablet daily alternate 100 mg with 200 mg   Yes Historical Provider, MD  lipase/protease/amylase (CREON) 12000 UNITS CPEP capsule Take 1 capsule (12,000 Units total) by mouth 3 (three) times daily. 03/23/15  Yes Unk Pinto, MD  Magnesium 500 MG TABS Take 1 tablet 2 to 4 x day as directed Patient taking differently: Take 1,000 mg by mouth daily.  10/16/15 10/15/16 Yes Unk Pinto, MD  metFORMIN (GLUCOPHAGE) 500 MG tablet Take 1-2 tablets (500-1,000 mg total) by mouth 3 (three) times daily. Take 1 tablet at breakfast, 1 tablet at lunch, and 2 tablets at dinner 03/23/15  Yes Unk Pinto, MD  Multiple Vitamins-Minerals (MULTIVITAMIN WITH MINERALS) tablet Take 1 tablet by mouth daily.    Yes Historical Provider, MD  ONGLYZA 5 MG TABS tablet Take 5 mg by mouth daily. 05/16/15  Yes Historical Provider, MD  potassium chloride SA (K-DUR,KLOR-CON) 20 MEQ tablet Take 20 mEq by mouth 4 (four) times daily.    Yes Historical Provider, MD  predniSONE (DELTASONE) 20 MG tablet 1 tab 3 x day for 3 days, then 1 tab 2 x day for 3 days, then 1 tab 1 x day for 5 days 10/16/15  Yes Unk Pinto, MD  sucralfate (CARAFATE) 1 G tablet TAKE 1 TABLET BY MOUTH 4 TIMES DAILY. 10/17/15  Yes Unk Pinto, MD  Vitamin D, Ergocalciferol, (DRISDOL) 50000 UNITS CAPS capsule Take 1 capsule (50,000 Units total) by mouth daily. 03/23/15  Yes Unk Pinto, MD   BP 121/65 mmHg  Pulse 95  Temp(Src) 97.8 F (36.6 C) (Oral)  Resp 18  Wt 71.668 kg  SpO2 96% Physical Exam  Constitutional: She is oriented to person, place, and time. She appears lethargic. She appears ill. No distress.  HENT:  Right Ear: External ear normal.  Left Ear: External ear normal.  Nose:  Nose normal.  Mouth/Throat: Oropharynx is clear and moist. No oropharyngeal exudate.  Eyes: Conjunctivae and EOM are normal. Pupils are equal, round, and reactive to light.  Neck: Normal range of motion. Neck supple.  Cardiovascular: Normal rate, regular rhythm, normal heart sounds and intact distal pulses.   Pulmonary/Chest: Effort normal and breath sounds normal. No respiratory distress. She has no wheezes.  Abdominal: Soft. Bowel sounds are normal. She exhibits no distension. There is no tenderness. There  is no rebound and no guarding.  Musculoskeletal: Normal range of motion. She exhibits no edema or tenderness.  Neurological: She is oriented to person, place, and time. She has normal strength. She appears lethargic. No cranial nerve deficit or sensory deficit. She displays a negative Romberg sign.  Skin: Skin is warm and dry. She is not diaphoretic. There is pallor.  Psychiatric: She has a normal mood and affect.  Nursing note and vitals reviewed.   ED Course  Procedures (including critical care time) Labs Review Labs Reviewed  BASIC METABOLIC PANEL - Abnormal; Notable for the following:    Sodium 123 (*)    Chloride 83 (*)    Glucose, Bld 620 (*)    BUN 59 (*)    Creatinine, Ser 3.12 (*)    Calcium 8.6 (*)    GFR calc non Af Amer 15 (*)    GFR calc Af Amer 18 (*)    All other components within normal limits  CBC - Abnormal; Notable for the following:    WBC 14.5 (*)    All other components within normal limits  URINALYSIS, ROUTINE W REFLEX MICROSCOPIC (NOT AT [facility]) - Abnormal; Notable for the following:    Glucose, UA >1000 (*)    Hgb urine dipstick TRACE (*)    Leukocytes, UA TRACE (*)    All other components within normal limits  URINE MICROSCOPIC-ADD ON - Abnormal; Notable for the following:    Bacteria, UA RARE (*)    All other components within normal limits  LACTIC ACID, PLASMA - Abnormal; Notable for the following:    Lactic Acid, Venous 4.1 (*)    All other  components within normal limits  GLUCOSE, CAPILLARY - Abnormal; Notable for the following:    Glucose-Capillary 298 (*)    All other components within normal limits  CBG MONITORING, ED - Abnormal; Notable for the following:    Glucose-Capillary 326 (*)    All other components within normal limits  URINE CULTURE  CULTURE, BLOOD (ROUTINE X 2)  CULTURE, BLOOD (ROUTINE X 2)  C DIFFICILE QUICK SCREEN W PCR REFLEX  CREATININE, URINE, RANDOM  PROTIME-INR  UREA NITROGEN, URINE  LACTIC ACID, PLASMA  COMPREHENSIVE METABOLIC PANEL  CBC    Imaging Review Dg Chest 2 View  10/18/2015  CLINICAL DATA:  Generalized weakness, dizziness, lethargy. History of cholangiocarcinoma. EXAM: CHEST  2 VIEW COMPARISON:  09/02/2014, 03/01/2014 FINDINGS: Stable heart size and vascularity. Mild background emphysema suspected. Lungs remain clear. No focal pneumonia, collapse or consolidation. Negative for edema, effusion, or pneumothorax. Trachea midline. Degenerative changes of the spine. IMPRESSION: Stable chest exam.  No superimposed acute process Electronically Signed   By: Jerilynn Mages.  Shick M.D.   On: 10/18/2015 18:42   I have personally reviewed and evaluated these images and lab results as part of my medical decision-making.   EKG Interpretation   Date/Time:  Wednesday October 18 2015 15:31:19 EST Ventricular Rate:  92 PR Interval:  135 QRS Duration: 91 QT Interval:  346 QTC Calculation: 428 R Axis:   76 Text Interpretation:  Sinus rhythm Borderline T abnormalities, inferior  leads Minimal ST elevation, inferior leads Baseline wander in lead(s) V1  No significant change since last tracing Confirmed by Roberts  [phone]) on 10/18/2015 3:42:56 PM      MDM   Final diagnoses:  Acute kidney injury ([place])  Hyperglycemia    Started broad spectrum weakness workup. EKG unchanged from prior. CXR shows likely emphysema but no acute  processes. BMP shows glucose of 620, Na 123, BUN 59, Cr 3.12 (pt's  baseline closer to 1). WBC count 14.5. I suspect she has pseudohyponatremia due to hyperglycemia. Hyperglycemia possibly due to infection as she has an elevated white count, however CXR and UA are clear. She states she has been taking her diabetes medications as prescribed at home. She did recently finish a course of steroids as well. Started on 2L bolus and insulin here in the ED. Given pt's clinical presentation, AKI, hyperglycemia will consult hospitalists for admission.  Spoke to Dr. Olevia Bowens, will admit to hospitalist service.    Anne Ng, PA-C 10/19/15 [id number]  Merrily Pew, MD 10/20/15 [phone]

## 2015-10-18 NOTE — Progress Notes (Signed)
10/18/15 2325 Nursing  Dr. Blaine Hamper called reg lactic acid level of 4.1 at this time. Order received for maintenance fluid of ns 100/h. Redraw of lactic acid for 2 am. Will continue to monitor patient

## 2015-10-18 NOTE — Telephone Encounter (Signed)
Patient called and states she is weak, can hardly stand and is disoriented at times.  Patient's lab results indicate she is dehydrated and the final results from her urine culture are not back.  Per Dr Melford Aase, the patient should go to the ER and be evaluated for dehydration.  Patient states she will go to the [employer] .

## 2015-10-18 NOTE — ED Notes (Signed)
Patient states that has had generalized body weakness, diziness and lethergy since Wednesday last week. patient also states that periodically has double vision.  States that went to PCP Monday where they drew labs which they are waiting on results.  Patient states that PCP told her that she was de-hydradrated and were waiting on her UA to confirm UTI.  Patient states that contacted her PCP ([place]) today since she was worse and they referred her to the ER.  Denies N/V/D, denies pain patient but, states that "just feels tired".  Breathing even and unlabored NAD at this time.

## 2015-10-18 NOTE — ED Notes (Signed)
CBG-326 

## 2015-10-18 NOTE — ED Notes (Signed)
Lab reported critical value of CBG 620 to this nurse. Triage called and made aware.

## 2015-10-18 NOTE — ED Provider Notes (Signed)
Medical screening examination/treatment/procedure(s) were conducted as a shared visit with non-physician practitioner(s) and myself.  I personally evaluated the patient during the encounter.  1 week of malaise, URI symptoms, seen by PCP and started on steroids Monday. Here with continued malaise, no focal neuro deficits on my exam. Overall, exam reassurring but has hyperglycemia. Also with white count and new AKI. Likely significantly dehydrated so will d/w medicine for admission.    EKG Interpretation   Date/Time:  Wednesday October 18 2015 15:31:19 EST Ventricular Rate:  92 PR Interval:  135 QRS Duration: 91 QT Interval:  346 QTC Calculation: 428 R Axis:   76 Text Interpretation:  Sinus rhythm Borderline T abnormalities, inferior  leads Minimal ST elevation, inferior leads Baseline wander in lead(s) V1  No significant change since last tracing Confirmed by Christy Gentles  MD, [place]  [phone]) on 10/18/2015 3:42:56 PM        Merrily Pew, MD 10/20/15 1549

## 2015-10-19 ENCOUNTER — Encounter (HOSPITAL_COMMUNITY): Payer: Self-pay | Admitting: Radiology

## 2015-10-19 ENCOUNTER — Observation Stay (HOSPITAL_COMMUNITY): Payer: 59

## 2015-10-19 DIAGNOSIS — R531 Weakness: Secondary | ICD-10-CM

## 2015-10-19 DIAGNOSIS — N179 Acute kidney failure, unspecified: Secondary | ICD-10-CM

## 2015-10-19 DIAGNOSIS — N189 Chronic kidney disease, unspecified: Secondary | ICD-10-CM

## 2015-10-19 LAB — CBC
HCT: 35.5 % — ABNORMAL LOW (ref 36.0–46.0)
Hemoglobin: 12.1 g/dL (ref 12.0–15.0)
MCH: 30 pg (ref 26.0–34.0)
MCHC: 34.1 g/dL (ref 30.0–36.0)
MCV: 87.9 fL (ref 78.0–100.0)
PLATELETS: 183 10*3/uL (ref 150–400)
RBC: 4.04 MIL/uL (ref 3.87–5.11)
RDW: 14 % (ref 11.5–15.5)
WBC: 7.9 10*3/uL (ref 4.0–10.5)

## 2015-10-19 LAB — COMPREHENSIVE METABOLIC PANEL
ALBUMIN: 2.1 g/dL — AB (ref 3.5–5.0)
ALK PHOS: 218 U/L — AB (ref 38–126)
ALT: 24 U/L (ref 14–54)
AST: 36 U/L (ref 15–41)
Anion gap: 11 (ref 5–15)
BILIRUBIN TOTAL: 0.6 mg/dL (ref 0.3–1.2)
BUN: 51 mg/dL — AB (ref 6–20)
CO2: 27 mmol/L (ref 22–32)
CREATININE: 2.73 mg/dL — AB (ref 0.44–1.00)
Calcium: 8.2 mg/dL — ABNORMAL LOW (ref 8.9–10.3)
Chloride: 94 mmol/L — ABNORMAL LOW (ref 101–111)
GFR calc Af Amer: 21 mL/min — ABNORMAL LOW (ref >60)
GFR, EST NON AFRICAN AMERICAN: 18 mL/min — AB (ref >60)
GLUCOSE: 307 mg/dL — AB (ref 65–99)
Potassium: 2.9 mmol/L — ABNORMAL LOW (ref 3.5–5.1)
Sodium: 132 mmol/L — ABNORMAL LOW (ref 135–145)
TOTAL PROTEIN: 5.4 g/dL — AB (ref 6.5–8.1)

## 2015-10-19 LAB — URINE CULTURE

## 2015-10-19 LAB — APTT: APTT: 32 s (ref 24–37)

## 2015-10-19 LAB — CREATININE, URINE, RANDOM: CREATININE, URINE: 23.82 mg/dL

## 2015-10-19 LAB — C DIFFICILE QUICK SCREEN W PCR REFLEX
C DIFFICLE (CDIFF) ANTIGEN: NEGATIVE
C Diff interpretation: NEGATIVE
C Diff toxin: NEGATIVE

## 2015-10-19 LAB — LACTIC ACID, PLASMA
LACTIC ACID, VENOUS: 2.2 mmol/L — AB (ref 0.5–2.0)
Lactic Acid, Venous: 1.8 mmol/L (ref 0.5–2.0)

## 2015-10-19 LAB — MAGNESIUM: MAGNESIUM: 2 mg/dL (ref 1.7–2.4)

## 2015-10-19 LAB — GLUCOSE, CAPILLARY
GLUCOSE-CAPILLARY: 359 mg/dL — AB (ref 65–99)
Glucose-Capillary: 231 mg/dL — ABNORMAL HIGH (ref 65–99)
Glucose-Capillary: 251 mg/dL — ABNORMAL HIGH (ref 65–99)
Glucose-Capillary: 242 mg/dL — ABNORMAL HIGH (ref 65–99)

## 2015-10-19 LAB — PROCALCITONIN: Procalcitonin: 2.49 ng/mL

## 2015-10-19 MED ORDER — POTASSIUM CHLORIDE 20 MEQ/15ML (10%) PO SOLN
40.0000 meq | Freq: Once | ORAL | Status: AC
  Administered 2015-10-19: 40 meq via ORAL
  Filled 2015-10-19: qty 30

## 2015-10-19 MED ORDER — PREMIER PROTEIN SHAKE
11.0000 [oz_av] | Freq: Two times a day (BID) | ORAL | Status: DC
  Administered 2015-10-19: 11 [oz_av] via ORAL
  Filled 2015-10-19: qty 325.31

## 2015-10-19 MED ORDER — INSULIN ASPART 100 UNIT/ML ~~LOC~~ SOLN
5.0000 [IU] | Freq: Once | SUBCUTANEOUS | Status: AC
  Administered 2015-10-19: 5 [IU] via SUBCUTANEOUS

## 2015-10-19 MED ORDER — POTASSIUM CHLORIDE 10 MEQ/100ML IV SOLN
10.0000 meq | INTRAVENOUS | Status: AC
  Administered 2015-10-19: 10 meq via INTRAVENOUS
  Filled 2015-10-19 (×2): qty 100

## 2015-10-19 MED ORDER — POTASSIUM CHLORIDE CRYS ER 20 MEQ PO TBCR
40.0000 meq | EXTENDED_RELEASE_TABLET | Freq: Once | ORAL | Status: AC
  Administered 2015-10-19: 40 meq via ORAL
  Filled 2015-10-19: qty 2

## 2015-10-19 MED ORDER — INSULIN GLARGINE 100 UNIT/ML ~~LOC~~ SOLN
10.0000 [IU] | Freq: Every day | SUBCUTANEOUS | Status: DC
  Filled 2015-10-19: qty 0.1

## 2015-10-19 NOTE — Evaluation (Signed)
Physical Therapy Evaluation Patient Details Name: Courtney Stevens MRN: [id number] DOB: 21-Jun-1956 Today's Date: 10/19/2015   History of Present Illness  Courtney Stevens is a 59 y.o. female with PMH of hypertension, hyperlipidemia, diabetes mellitus, asthma, GERD, hypothyroidism, anxiety, CKD-2, hepatic necrotizing granuloma, biliary duct cancer 2009, who presents with dysuria, generalized weakness, dizziness and right ear muffling  Clinical Impression  Pt admitted with above diagnosis. Pt currently with functional limitations due to the deficits listed below (see PT Problem List). Pt will benefit from skilled PT to increase their independence and safety with mobility to allow discharge to the venue listed below.   Patient did ambulate in the room,  Is weak but should progress to DC to home.    Follow Up Recommendations No PT follow up    Equipment Recommendations  None recommended by PT    Recommendations for Other Services       Precautions / Restrictions        Mobility  Bed Mobility Overal bed mobility: Needs Assistance                Transfers Overall transfer level: Needs assistance Equipment used: 1 person hand held assist Transfers: Sit to/from Stand Sit to Stand: Supervision         General transfer comment: no assist  to stand.  Ambulation/Gait Ambulation/Gait assistance: Min guard Ambulation Distance (Feet): 20 Feet (x 2)   Gait Pattern/deviations: Step-through pattern     General Gait Details: pushed IV pole  Stairs            Wheelchair Mobility    Modified Rankin (Stroke Patients Only)       Balance Overall balance assessment: Needs assistance Sitting-balance support: No upper extremity supported;Feet supported Sitting balance-Leahy Scale: Normal       Standing balance-Leahy Scale: Good                               Pertinent Vitals/Pain Pain Assessment: No/denies pain    Home Living Family/patient expects to be  discharged to:: Private residence   Available Help at Discharge: Family Type of Home: House Home Access: Stairs to enter Entrance Stairs-Rails: Chemical engineer of Steps: 3 Home Layout: One level Home Equipment: None      Prior Function Level of Independence: Independent               Hand Dominance        Extremity/Trunk Assessment   Upper Extremity Assessment: Defer to OT evaluation           Lower Extremity Assessment: Generalized weakness      Cervical / Trunk Assessment: Normal  Communication   Communication: No difficulties  Cognition Arousal/Alertness: Awake/alert Behavior During Therapy: WFL for tasks assessed/performed Overall Cognitive Status: Within Functional Limits for tasks assessed                      General Comments      Exercises        Assessment/Plan    PT Assessment Patient needs continued PT services  PT Diagnosis Generalized weakness;Difficulty walking   PT Problem List Decreased strength;Decreased activity tolerance;Decreased mobility;Decreased knowledge of use of DME;Decreased safety awareness;Decreased knowledge of precautions  PT Treatment Interventions Gait training;Functional mobility training;Patient/family education   PT Goals (Current goals can be found in the Care Plan section) Acute Rehab PT Goals Patient Stated Goal: to get stronger PT Goal  Formulation: With patient Time For Goal Achievement: 11/02/15 Potential to Achieve Goals: Good    Frequency Min 3X/week   Barriers to discharge        Co-evaluation               End of Session   Activity Tolerance: Patient tolerated treatment well Patient left: in chair;with chair alarm set;with call bell/phone within reach Nurse Communication: Mobility status         Time: 1510-1538 PT Time Calculation (min) (ACUTE ONLY): 28 min   Charges:   PT Evaluation $Initial PT Evaluation Tier I: 1 Procedure PT Treatments $Gait  Training: 8-22 mins $Self Care/Home Management: 8-22   PT G Codes:        Claretha Cooper 10/19/2015, 4:35 PM Tresa Endo PT [phone]

## 2015-10-19 NOTE — Progress Notes (Addendum)
Initial Nutrition Assessment  DOCUMENTATION CODES:   Non-severe (moderate) malnutrition in context of chronic illness  INTERVENTION:   Provide Premier Protein supplement BID, each provides 160 kcal and 30g protein. Encourage PO intake RD to continue to monitor  NUTRITION DIAGNOSIS:   Malnutrition related to chronic illness as evidenced by mild depletion of muscle mass, moderate depletion of body fat.  GOAL:   Patient will meet greater than or equal to 90% of their needs  MONITOR:   PO intake, Supplement acceptance, Labs, Weight trends, Skin, I & O's  REASON FOR ASSESSMENT:   Consult Assessment of nutrition requirement/status  ASSESSMENT:   59 y.o. female with PMH of hypertension, hyperlipidemia, diabetes mellitus, asthma, GERD, hypothyroidism, anxiety, CKD-2, hepatic necrotizing granuloma, biliary duct cancer 2009, who presents with dysuria, generalized weakness, dizziness and right ear muffling.  Patient reports eating well today with good appetite. Current PO intake: 100%. Pt with history of gastric bypass in 2004. Pt states that her blood sugars were controlled until her diagnosis with cholangiocarcinoma, in which was treated with chemotherapy and radiation. Pt continues to lose weight, she has lost 11 lb since May (7% weight loss x 7 months, insignificant for time frame). Patient does not like Glucerna or Ensure supplements. She is willing to try our bariatric supplement, Premier Protein. Will order.  Nutrition-Focused physical exam completed. Findings are moderate fat depletion, mild muscle depletion, and mild edema.   Labs reviewed: CBGs: 231 Low Na, K Elevated BUN & Creatinine Mg WNL  Diet Order:  Diet heart healthy/carb modified Room service appropriate?: Yes; Fluid consistency:: Thin  Skin:  Reviewed, no issues  Last BM:  11/30  Height:   Ht Readings from Last 1 Encounters:  10/18/15 5\' 7"  (1.702 m)    Weight:   Wt Readings from Last 1 Encounters:   10/18/15 158 lb (71.668 kg)    Ideal Body Weight:  61.4 kg  BMI:  Body mass index is 24.74 kg/(m^2).  Estimated Nutritional Needs:   Kcal:  1900-2100  Protein:  100-110g  Fluid:  2L/day  EDUCATION NEEDS:   No education needs identified at this time  Clayton Bibles, MS, RD, LDN Pager: [phone] After Hours Pager: [phone]

## 2015-10-19 NOTE — Progress Notes (Signed)
OT Cancellation Note  Patient Details Name: GENIEVA ROCHEL MRN: [id number] DOB: Jan 05, 1956   Cancelled Treatment:    Reason Eval/Treat Not Completed: Patient at procedure or test/ unavailable -- observed patient being taken off the floor in her bed for a test. Also note pt's K+ is 2.9. Will follow up for OT evaluation as able and appropriate.  Taneesha Edgin A 10/19/2015, 8:44 AM

## 2015-10-19 NOTE — Progress Notes (Signed)
Patient ID: Courtney Stevens, female   DOB: 1956/09/11, 59 y.o.   MRN: [id number]   [facility] PROGRESS NOTE  DENYSE FILLION EYE:[id number] DOB: [birth year] DOA: 10/18/2015 PCP: [name], MD   Brief narrative:    59 y.o. female with PMH of hypertension, hyperlipidemia, diabetes mellitus, asthma, GERD, hypothyroidism, anxiety, CKD-2, hepatic necrotizing granuloma, biliary duct cancer 2009, who presented with dysuria, generalized weakness, dizziness and right ear muffling. Patient was seen by her PCP on 10/16/15, and was given prescription of prednisone and Levaquin for UTI, possible maxillary sinusitis and otitis media.   In ED, patient was found to have WBC 14.5, positive urinalysis with trace amount of leukocytes, temperature normal, no tachycardia, worsening renal function, negative chest x-ray for acute abnormalities, elevated sugar at 620 with AG 14. Patient's admitted to inpatient for further evaluation and treatment.  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 Coli per urine culture 11/28 - continue rocephin day #2   Protein-calorie malnutrition, moderate ([place]) - continue nutritional supplements   DVT prophylaxis - SCD's  Code Status: Full.  Family Communication:  plan of care discussed with the patient Disposition Plan: Home 12/02  IV access:  Peripheral IV  Procedures and diagnostic studies:    Dg Chest 2 View 10/18/2015 Stable chest exam.  No superimposed acute process   Ct Head Wo Contrast  10/19/2015  No acute intracranial process.   US Renal 10/19/2015  Neither kidney exhibits significant echotexture increase, parenchymal masses, or hydronephrosis.   Medical Consultants:  PT  Other Consultants:  None  IAnti-Infectives:   Rocephin 11/30 -->  Faye Ramsay, MD  [facility] Pager [phone]  If 7PM-7AM, please contact night-coverage [email or web address] Password [email or web address] 10/19/2015, 1:49 PM   LOS: 1 day   HPI/Subjective: No events overnight.   Objective: Filed Vitals:   10/18/15 2203 10/19/15 0045 10/19/15 0055 10/19/15 0500  BP:  109/64  106/56  Pulse:  73  71  Temp:  98 F (36.7 C)  97.8 F (36.6 C)  TempSrc:  Oral  Oral  Resp:  '16 18 16  ' Height: '5\' 7"'  (1.702 m)     Weight: 71.668 kg (158 lb)     SpO2:  98% 98% 100%    Intake/Output Summary (Last 24 hours) at 10/19/15 1349 Last data filed at 10/19/15 1100  Gross per 24 hour  Intake   2263 ml  Output   2200 ml  Net     63 ml    Exam:   General:  Pt is alert, follows commands appropriately, not in acute distress  Cardiovascular: Regular rate and rhythm, no rubs, no gallops  Respiratory: Clear to auscultation bilaterally, no wheezing, no crackles, no rhonchi  Abdomen: Soft, non tender, non distended, bowel sounds present, no guarding   Data Reviewed: Basic Metabolic Panel:  Recent Labs Lab 10/16/15 1228 10/18/15 1647 10/19/15 0210 10/19/15 0433  NA 125* 123* 132*  --   K 4.1 4.5 2.9*  --   CL 83* 83* 94*  --   CO2 '28 26 27  ' --   GLUCOSE 391* 620* 307*  --   BUN 37* 59* 51*  --   CREATININE 2.74* 3.12*  2.73*  --   CALCIUM 8.5* 8.6* 8.2*  --   MG 2.0  --   --  2.0   Liver Function Tests:  Recent Labs Lab 10/19/15 0210  AST 36  ALT 24  ALKPHOS 218*  BILITOT 0.6  PROT 5.4*  ALBUMIN 2.1*   CBC:  Recent Labs Lab 10/16/15 1228 10/18/15 1647 10/19/15 0210  WBC 15.8* 14.5* 7.9  NEUTROABS 13.6*  --   --   HGB 11.3* 12.4 12.1  HCT 34.0* 37.3 35.5*  MCV 90.2 89.9 87.9  PLT 164 236 183   CBG:  Recent Labs Lab 10/18/15 2016 10/18/15 2130 10/19/15 0804 10/19/15 1223  GLUCAP 326* 298* 231* 251*    Recent Results (from the past 240 hour(s))  Urine culture     Status: None   Collection Time: 10/16/15  2:40 PM  Result Value Ref Range Status   Culture ESCHERICHIA COLI  Final   Colony Count >=100,000 COLONIES/ML  Final   Organism ID, Bacteria ESCHERICHIA COLI  Final      Susceptibility   Escherichia coli -  (no method available)    AMPICILLIN >=32 Resistant     AMOX/CLAVULANIC 8 Sensitive     AMPICILLIN/SULBACTAM 16 Intermediate     PIP/TAZO <=4 Sensitive     IMIPENEM <=0.25 Sensitive     CEFAZOLIN <=4 Not Reportable     CEFTRIAXONE <=1 Sensitive     CEFTAZIDIME <=1 Sensitive     CEFEPIME <=1 Sensitive     GENTAMICIN <=1 Sensitive     TOBRAMYCIN <=1 Sensitive     CIPROFLOXACIN <=0.25 Sensitive     LEVOFLOXACIN <=0.12 Sensitive     NITROFURANTOIN <=16 Sensitive     TRIMETH/SULFA* <=20 Sensitive      * NR=NOT REPORTABLE,SEE COMMENTORAL therapy:A cefazolin MIC of <32 predicts susceptibility to the oral agents cefaclor,cefdinir,cefpodoxime,cefprozil,cefuroxime,cephalexin,and loracarbef when used for therapy of uncomplicated UTIs due to E.coli,K.pneumomiae,and P.mirabilis. PARENTERAL therapy: A cefazolinMIC of >8 indicates resistance to parenteralcefazolin. An alternate test method must beperformed to confirm susceptibility to parenteralcefazolin.  Culture, blood (routine x 2)     Status: None (Preliminary result)   Collection Time: 10/18/15 10:28 PM  Result Value Ref  Range Status   Specimen Description BLOOD RIGHT HAND  Final   Special Requests IN PEDIATRIC BOTTLE 2CC  Final   Culture   Final    NO GROWTH < 24 HOURS Performed at [facility]    Report Status PENDING  Incomplete  Culture, blood (routine x 2)     Status: None (Preliminary result)   Collection Time: 10/18/15 10:35 PM  Result Value Ref Range Status   Specimen Description BLOOD RIGHT HAND  Final   Special Requests IN PEDIATRIC BOTTLE 3CC  Final   Culture   Final    NO GROWTH < 24 HOURS  Performed at [facility]    Report Status PENDING  Incomplete     Scheduled Meds: . calcium citrate  200 mg of elemental calcium Oral TID  . cefTRIAXone (ROCEPHIN)  IV  1 g Intravenous Q24H  . ferrous sulfate  325 mg Oral Q breakfast  . heparin  5,000 Units Subcutaneous 3 times per day  . insulin aspart  0-9 Units Subcutaneous TID WC  . insulin glargine  5 Units Subcutaneous Daily  . [START ON 10/20/2015] levothyroxine  100 mcg Oral Q48H  . levothyroxine  200 mcg Oral Q48H  . lipase/protease/amylase  12,000 Units Oral TID  . loratadine  10 mg Oral Daily  . magnesium gluconate  1,000 mg Oral Daily  . multivitamin with minerals  1 tablet Oral Daily  . pantoprazole  40 mg Oral Daily  . predniSONE  40 mg Oral Q breakfast  . protein supplement shake  11 oz Oral BID BM  . sodium chloride  3 mL Intravenous Q12H  . sucralfate  1 g Oral TID WC & HS   Continuous Infusions: . sodium chloride 100 mL/hr at 10/19/15 0037

## 2015-10-19 NOTE — Progress Notes (Signed)
Inpatient Diabetes Program Recommendations  AACE/ADA: New Consensus Statement on Inpatient Glycemic Control (2015)  Target Ranges:  Prepandial:   less than 140 mg/dL      Peak postprandial:   less than 180 mg/dL (1-2 hours)      Critically ill patients:  140 - 180 mg/dL   Review of Glycemic Control  Results for GINI, ELLINGSON (MRN [id number]) as of 10/19/2015 11:41  Ref. Range 10/18/2015 20:16 10/18/2015 21:30 10/19/2015 08:04  Glucose-Capillary Latest Ref Range: 65-99 mg/dL 326 (H) 298 (H) 231 (H)  Results for MANASA, LAROCHE (MRN [id number]) as of 10/19/2015 11:41  Ref. Range 07/25/2015 09:47  Hemoglobin A1C Latest Ref Range: <5.7 % 7.7 (H)    Inpatient Diabetes Program Recommendations:  Insulin - Basal: Increase Lantus to 10 units QAM.   Will continue to follow. Thank you. Lorenda Peck, RD, LDN, CDE Inpatient Diabetes Coordinator [phone]

## 2015-10-20 LAB — BASIC METABOLIC PANEL
Anion gap: 7 (ref 5–15)
BUN: 37 mg/dL — AB (ref 6–20)
CHLORIDE: 102 mmol/L (ref 101–111)
CO2: 21 mmol/L — ABNORMAL LOW (ref 22–32)
Calcium: 8.1 mg/dL — ABNORMAL LOW (ref 8.9–10.3)
Creatinine, Ser: 2 mg/dL — ABNORMAL HIGH (ref 0.44–1.00)
GFR calc Af Amer: 30 mL/min — ABNORMAL LOW (ref >60)
GFR calc non Af Amer: 26 mL/min — ABNORMAL LOW (ref >60)
GLUCOSE: 354 mg/dL — AB (ref 65–99)
POTASSIUM: 4.1 mmol/L (ref 3.5–5.1)
Sodium: 130 mmol/L — ABNORMAL LOW (ref 135–145)

## 2015-10-20 LAB — CBC
HEMATOCRIT: 35.1 % — AB (ref 36.0–46.0)
HEMOGLOBIN: 11.7 g/dL — AB (ref 12.0–15.0)
MCH: 29.8 pg (ref 26.0–34.0)
MCHC: 33.3 g/dL (ref 30.0–36.0)
MCV: 89.3 fL (ref 78.0–100.0)
Platelets: 191 10*3/uL (ref 150–400)
RBC: 3.93 MIL/uL (ref 3.87–5.11)
RDW: 14.4 % (ref 11.5–15.5)
WBC: 6.8 10*3/uL (ref 4.0–10.5)

## 2015-10-20 LAB — URINE CULTURE

## 2015-10-20 LAB — GLUCOSE, CAPILLARY: Glucose-Capillary: 286 mg/dL — ABNORMAL HIGH (ref 65–99)

## 2015-10-20 LAB — UREA NITROGEN, URINE: UREA NITROGEN UR: 251 mg/dL

## 2015-10-20 MED ORDER — GLIPIZIDE 5 MG PO TABS: 5.0000 mg | ORAL_TABLET | Freq: Every day | ORAL | Status: DC

## 2015-10-20 NOTE — Progress Notes (Signed)
OT Note  Patient Details Name: Courtney Stevens MRN: [id number] DOB: 1956/02/21   Cancelled Treatment:    Reason Eval/Treat Not Completed: OT screened, no needs identified, will sign off -- [name]. Reports she is I with ADLs and denies need for OT. Patient reports she is going home today. OT will sign off.  Oluwatimileyin Vivier A 10/20/2015, 9:18 AM

## 2015-10-20 NOTE — Discharge Instructions (Signed)
Acute Kidney Injury °Acute kidney injury is any condition in which there is sudden (acute) damage to the kidneys. Acute kidney injury was previously known as acute kidney failure or acute renal failure. The kidneys are two organs that lie on either side of the spine between the middle of the back and the front of the abdomen. The kidneys: °· Remove wastes and extra water from the blood.   °· Produce important hormones. These help keep bones strong, regulate blood pressure, and help create red blood cells.   °· Balance the fluids and chemicals in the blood and tissues. °A small amount of kidney damage may not cause problems, but a large amount of damage may make it difficult or impossible for the kidneys to work the way they should. Acute kidney injury may develop into long-lasting (chronic) kidney disease. It may also develop into a life-threatening disease called end-stage kidney disease. Acute kidney injury can get worse very quickly, so it should be treated right away. Early treatment may prevent other kidney diseases from developing. °CAUSES  °· A problem with blood flow to the kidneys. This may be caused by:   °¨ Blood loss.   °¨ Heart disease.   °¨ Severe burns.   °¨ Liver disease. °· Direct damage to the kidneys. This may be caused by: °¨ Some medicines.   °¨ A kidney infection.   °¨ Poisoning or consuming toxic substances.   °¨ A surgical wound.   °¨ A blow to the kidney area.   °· A problem with urine flow. This may be caused by:   °¨ Cancer.   °¨ Kidney stones.   °¨ An enlarged prostate. °SIGNS AND SYMPTOMS  °· Swelling (edema) of the legs, ankles, or feet.   °· Tiredness (lethargy).   °· Nausea or vomiting.   °· Confusion.   °· Problems with urination, such as:   °¨ Painful or burning feeling during urination.   °¨ Decreased urine production.   °¨ Frequent accidents in children who are potty trained.   °¨ Bloody urine.   °· Muscle twitches and cramps.   °· Shortness of breath.   °· Seizures.   °· Chest  pain or pressure. °Sometimes, no symptoms are present.  °DIAGNOSIS °Acute kidney injury may be detected and diagnosed by tests, including blood, urine, imaging, or kidney biopsy tests.  °TREATMENT °Treatment of acute kidney injury varies depending on the cause and severity of the kidney damage. In mild cases, no treatment may be needed. The kidneys may heal on their own. If acute kidney injury is more severe, your health care provider will treat the cause of the kidney damage, help the kidneys heal, and prevent complications from occurring. Severe cases may require a procedure to remove toxic wastes from the body (dialysis) or surgery to repair kidney damage. Surgery may involve:  °· Repair of a torn kidney.   °· Removal of an obstruction. °HOME CARE INSTRUCTIONS °· Follow your prescribed diet. °· Take medicines only as directed by your health care provider.  °· Do not take any new medicines (prescription, over-the-counter, or nutritional supplements) unless approved by your health care provider. Many medicines can worsen your kidney damage or may need to have the dose adjusted.   °· Keep all follow-up visits as directed by your health care provider. This is important. °· Observe your condition to make sure you are healing as expected. °SEEK IMMEDIATE MEDICAL CARE IF: °· You are feeling ill or have severe pain in the back or side.   °· Your symptoms return or you have new symptoms. °· You have any symptoms of end-stage kidney disease. These include:   °¨ Persistent itchiness.   °¨   Loss of appetite.   °¨ Headaches.   °¨ Abnormally dark or light skin. °¨ Numbness in the hands or feet.   °¨ Easy bruising.   °¨ Frequent hiccups.   °¨ Menstruation stops.   °· You have a fever. °· You have increased urine production. °· You have pain or bleeding when urinating. °MAKE SURE YOU:  °· Understand these instructions. °· Will watch your condition. °· Will get help right away if you are not doing well or get worse. °  °This  information is not intended to replace advice given to you by your health care provider. Make sure you discuss any questions you have with your health care provider. °  °Document Released: 05/20/2011 Document Revised: 11/25/2014 Document Reviewed: 07/03/2012 °Elsevier Interactive Patient Education ©2016 [employer] ° °

## 2015-10-20 NOTE — Discharge Summary (Addendum)
Physician Discharge Summary  Courtney Stevens WYO:[id number] DOB: 1956/07/25 DOA: 10/18/2015  PCP: [name], MD  Admit date: 10/18/2015 Discharge date: 10/20/2015  Recommendations for Outpatient Follow-up:  1. Pt will need to follow up with PCP in 1-2 weeks post discharge 2. Please obtain BMP to evaluate electrolytes and kidney function 3. Please also check CBC to evaluate Hg and Hct levels 4. Please note that Metformin was temporarily stopped until renal function stabilizes 5. Pt was placed on Glipizide instead 6. 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 female with PMH of hypertension, hyperlipidemia, diabetes mellitus, asthma, GERD, hypothyroidism, anxiety, CKD-2, hepatic necrotizing granuloma, biliary duct cancer 2009, who presented with dysuria, generalized weakness, dizziness and right ear muffling. Patient was seen by her PCP on 10/16/15, and was given prescription of prednisone and Levaquin for UTI, possible maxillary sinusitis and otitis media.   In ED, patient was found to have WBC 14.5, positive urinalysis with trace amount of leukocytes, temperature normal, no tachycardia, worsening renal function, negative chest x-ray for acute abnormalities, elevated sugar at 620 with AG 14. Patient's admitted to inpatient for further evaluation and  treatment.  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 Coli per urine culture 11/28, sensitive to Levaquin that pt has been previously on so ok to continue    Protein-calorie malnutrition, moderate ([place]) - continue nutritional supplements    Code Status: Full.  Family Communication: plan of care discussed with the patient Disposition Plan: Home   IV access  Peripheral IV  Procedures and diagnostic studies:   Dg Chest 2 View 10/18/2015 Stable chest exam. No superimposed acute process   Ct Head Wo Contrast 10/19/2015 No acute intracranial process.   US Renal 10/19/2015 Neither kidney exhibits significant echotexture increase, parenchymal masses, or hydronephrosis.   Medical Consultants:  PT       Discharge Exam: Filed Vitals:   10/19/15 2038 10/20/15 0432  BP: 105/68 121/88  Pulse: 66 59  Temp: 98.2 F (36.8 C) 97.6 F (36.4 C)  Resp: 17 16   Filed Vitals:   10/19/15 0500 10/19/15 1400 10/19/15 2038 10/20/15 0432  BP: 106/56 108/62 105/68 121/88  Pulse: 71 74 66 59  Temp: 97.8 F (36.6 C) 97.8 F (36.6 C) 98.2 F (36.8 C)  97.6 F (36.4 C)  TempSrc: Oral Oral Oral Oral  Resp: '16 16 17 16  ' Height:      Weight:      SpO2: 100% 98% 100% 99%    General: Pt is alert, follows commands appropriately, not in acute distress Cardiovascular: Regular rate and rhythm, no rubs, no gallops Respiratory: Clear to auscultation bilaterally, no wheezing, no crackles, no rhonchi Abdominal: Soft, non tender, non distended, bowel sounds +, no guarding Extremities: no edema, no cyanosis, pulses palpable bilaterally DP and PT Neuro: Grossly nonfocal  Discharge Instructions  Discharge Instructions    Diet - low sodium heart healthy    Complete by:  As directed      Increase activity slowly    Complete by:  As directed             Medication List    STOP taking these medications        metFORMIN 500 MG tablet  Commonly known as:  GLUCOPHAGE      TAKE these medications        albuterol 108 (90 BASE) MCG/ACT inhaler  Commonly known as:  VENTOLIN HFA  Inhale 2 puffs into the lungs every 4 (four) hours as needed for wheezing or shortness of breath.     ALPRAZolam 1 MG tablet  Commonly known as:  XANAX  TAKE 1/2 TO 1 TABLET 3 TIMES A DAY     calcium citrate-vitamin D 315-200 MG-UNIT tablet  Commonly known as:  CITRACAL+D  Take 1 tablet by mouth 3 (three) times daily.     cetirizine 10 MG tablet  Commonly known as:  ZYRTEC  Take 10 mg by mouth every morning.     DEXILANT 60 MG capsule  Generic drug:  dexlansoprazole  Take 60 mg by mouth daily.     ferrous sulfate 325 (65 FE) MG tablet  Take 325 mg by mouth daily with breakfast.     fluticasone 50 MCG/ACT nasal spray  Commonly known as:  FLONASE  Place 2 sprays into both nostrils daily.     furosemide 40 MG tablet  Commonly known as:  LASIX  Take 1 to 2 tablets 2 x day for fluid & swelling     glipiZIDE 5 MG tablet  Commonly known as:  GLUCOTROL  Take 1 tablet (5 mg total) by mouth daily before breakfast.     levofloxacin 500 MG tablet  Commonly  known as:  LEVAQUIN  Take 1 tablet (500 mg total) by mouth daily.     levothyroxine 200 MCG tablet  Commonly known as:  SYNTHROID, LEVOTHROID  Take 100-200 mcg by mouth daily before breakfast. 0.5 - 1 tablet daily alternate 100 mg with 200 mg     lipase/protease/amylase 12000 UNITS Cpep capsule  Commonly known as:  CREON  Take 1 capsule (12,000 Units total) by mouth 3 (three) times daily.     Magnesium 500 MG Tabs  Take 1 tablet 2 to 4 x day as directed     multivitamin with minerals tablet  Take 1 tablet by mouth daily.     ONGLYZA 5 MG Tabs tablet  Generic drug:  saxagliptin HCl  Take 5 mg  by mouth daily.     potassium chloride SA 20 MEQ tablet  Commonly known as:  K-DUR,KLOR-CON  Take 20 mEq by mouth 4 (four) times daily.     predniSONE 20 MG tablet  Commonly known as:  DELTASONE  1 tab 3 x day for 3 days, then 1 tab 2 x day for 3 days, then 1 tab 1 x day for 5 days     sucralfate 1 G tablet  Commonly known as:  CARAFATE  TAKE 1 TABLET BY MOUTH 4 TIMES DAILY.     Vitamin D (Ergocalciferol) 50000 UNITS Caps capsule  Commonly known as:  DRISDOL  Take 1 capsule (50,000 Units total) by mouth daily.           Follow-up Information    Follow up with [name], MD.   Specialty:  Internal Medicine   Contact information:   [address] [place] [address] [phone]       Call Faye Ramsay, MD.   Specialty:  Internal Medicine   Why:  As needed call my cell phone [phone]   Contact information:   [address] [place] [address] [phone]        The results of significant diagnostics from this hospitalization (including imaging, microbiology, ancillary and laboratory) are listed below for reference.     Microbiology: Recent Results (from the past 240 hour(s))  Urine culture     Status: None   Collection Time: 10/16/15  2:40 PM  Result Value Ref Range Status   Culture ESCHERICHIA COLI   Final   Colony Count >=100,000 COLONIES/ML  Final   Organism ID, Bacteria ESCHERICHIA COLI  Final      Susceptibility   Escherichia coli -  (no method available)    AMPICILLIN >=32 Resistant     AMOX/CLAVULANIC 8 Sensitive     AMPICILLIN/SULBACTAM 16 Intermediate     PIP/TAZO <=4 Sensitive     IMIPENEM <=0.25 Sensitive     CEFAZOLIN <=4 Not Reportable     CEFTRIAXONE <=1 Sensitive     CEFTAZIDIME <=1 Sensitive     CEFEPIME <=1 Sensitive     GENTAMICIN <=1 Sensitive     TOBRAMYCIN <=1 Sensitive     CIPROFLOXACIN <=0.25 Sensitive     LEVOFLOXACIN <=0.12 Sensitive     NITROFURANTOIN <=16 Sensitive     TRIMETH/SULFA* <=20 Sensitive      * NR=NOT REPORTABLE,SEE COMMENTORAL therapy:A cefazolin MIC of <32 predicts susceptibility to the oral agents cefaclor,cefdinir,cefpodoxime,cefprozil,cefuroxime,cephalexin,and loracarbef when used for therapy of uncomplicated UTIs due to E.coli,K.pneumomiae,and P.mirabilis. PARENTERAL therapy: A cefazolinMIC of >8 indicates resistance to parenteralcefazolin. An alternate test method must beperformed to confirm susceptibility to parenteralcefazolin.  Culture, blood (routine x 2)     Status: None (Preliminary result)   Collection Time: 10/18/15 10:28 PM  Result Value Ref Range Status   Specimen Description BLOOD RIGHT HAND  Final   Special Requests IN PEDIATRIC BOTTLE 2CC  Final   Culture   Final    NO GROWTH < 24 HOURS Performed at [facility]    Report Status PENDING  Incomplete  Culture, blood (routine x 2)     Status: None (Preliminary result)   Collection Time: 10/18/15 10:35 PM  Result Value Ref Range Status   Specimen Description BLOOD RIGHT HAND  Final   Special Requests IN PEDIATRIC BOTTLE 3CC  Final   Culture   Final    NO GROWTH < 24 HOURS Performed at  [facility]    Report Status PENDING  Incomplete  C difficile quick scan w PCR reflex     Status: None   Collection Time: 10/19/15 12:16 PM  Result Value Ref Range Status    C Diff antigen NEGATIVE NEGATIVE Final   C Diff toxin NEGATIVE NEGATIVE Final   C Diff interpretation Negative for toxigenic C. difficile  Final     Labs: Basic Metabolic Panel:  Recent Labs Lab 10/16/15 1228 10/18/15 1647 10/19/15 0210 10/19/15 0433 10/20/15 0425  NA 125* 123* 132*  --  130*  K 4.1 4.5 2.9*  --  4.1  CL 83* 83* 94*  --  102  CO2 '28 26 27  ' --  21*  GLUCOSE 391* 620* 307*  --  354*  BUN 37* 59* 51*  --  37*  CREATININE 2.74* 3.12* 2.73*  --  2.00*  CALCIUM 8.5* 8.6* 8.2*  --  8.1*  MG 2.0  --   --  2.0  --    Liver Function Tests:  Recent Labs Lab 10/19/15 0210  AST 36  ALT 24  ALKPHOS 218*  BILITOT 0.6  PROT 5.4*  ALBUMIN 2.1*   No results for input(s): LIPASE, AMYLASE in the last 168 hours. No results for input(s): AMMONIA in the last 168 hours. CBC:  Recent Labs Lab 10/16/15 1228 10/18/15 1647 10/19/15 0210 10/20/15 0425  WBC 15.8* 14.5* 7.9 6.8  NEUTROABS 13.6*  --   --   --   HGB 11.3* 12.4 12.1 11.7*  HCT 34.0* 37.3 35.5* 35.1*  MCV 90.2 89.9 87.9 89.3  PLT 164 236 183 191    CBG:  Recent Labs Lab 10/19/15 0804 10/19/15 1223 10/19/15 1726 10/19/15 2110 10/20/15 0724  GLUCAP 231* 251* 242* 359* 286*     SIGNED: Time coordinating discharge: 30 minutes  MAGICK-MYERS, ISKRA, MD  [facility] 10/20/2015, 8:52 AM Pager [phone]  If 7PM-7AM, please contact night-coverage [email or web address] Password [email or web address]

## 2015-10-21 ENCOUNTER — Encounter: Payer: Self-pay | Admitting: Physician Assistant

## 2015-10-23 LAB — CULTURE, BLOOD (ROUTINE X 2)
CULTURE: NO GROWTH
Culture: NO GROWTH

## 2015-10-24 ENCOUNTER — Ambulatory Visit: Payer: Self-pay | Admitting: Internal Medicine

## 2015-10-24 ENCOUNTER — Ambulatory Visit: Payer: Self-pay | Admitting: Physician Assistant

## 2015-10-26 ENCOUNTER — Ambulatory Visit: Payer: Self-pay | Admitting: Internal Medicine

## 2015-10-26 ENCOUNTER — Encounter: Payer: Self-pay | Admitting: Physician Assistant

## 2015-10-26 ENCOUNTER — Ambulatory Visit: Payer: Self-pay | Admitting: Physician Assistant

## 2015-10-26 ENCOUNTER — Other Ambulatory Visit: Payer: Self-pay

## 2015-10-26 ENCOUNTER — Ambulatory Visit (INDEPENDENT_AMBULATORY_CARE_PROVIDER_SITE_OTHER): Payer: 59 | Admitting: Physician Assistant

## 2015-10-26 VITALS — BP 100/70 | HR 95 | Temp 97.5°F | Resp 14 | Ht 67.0 in | Wt 149.0 lb

## 2015-10-26 DIAGNOSIS — K2100 Gastro-esophageal reflux disease with esophagitis, without bleeding: Secondary | ICD-10-CM

## 2015-10-26 DIAGNOSIS — N179 Acute kidney failure, unspecified: Secondary | ICD-10-CM

## 2015-10-26 DIAGNOSIS — R079 Chest pain, unspecified: Secondary | ICD-10-CM

## 2015-10-26 DIAGNOSIS — E876 Hypokalemia: Secondary | ICD-10-CM | POA: Diagnosis not present

## 2015-10-26 DIAGNOSIS — E039 Hypothyroidism, unspecified: Secondary | ICD-10-CM | POA: Diagnosis not present

## 2015-10-26 LAB — CBC WITH DIFFERENTIAL/PLATELET
BASOS ABS: 0 10*3/uL (ref 0.0–0.1)
Basophils Relative: 0 % (ref 0–1)
EOS ABS: 0 10*3/uL (ref 0.0–0.7)
EOS PCT: 0 % (ref 0–5)
HCT: 37 % (ref 36.0–46.0)
Hemoglobin: 11.9 g/dL — ABNORMAL LOW (ref 12.0–15.0)
LYMPHS ABS: 0.9 10*3/uL (ref 0.7–4.0)
Lymphocytes Relative: 5 % — ABNORMAL LOW (ref 12–46)
MCH: 29.5 pg (ref 26.0–34.0)
MCHC: 32.2 g/dL (ref 30.0–36.0)
MCV: 91.8 fL (ref 78.0–100.0)
MPV: 9.7 fL (ref 8.6–12.4)
Monocytes Absolute: 0.9 10*3/uL (ref 0.1–1.0)
Monocytes Relative: 5 % (ref 3–12)
Neutro Abs: 15.3 10*3/uL — ABNORMAL HIGH (ref 1.7–7.7)
Neutrophils Relative %: 90 % — ABNORMAL HIGH (ref 43–77)
PLATELETS: 315 10*3/uL (ref 150–400)
RBC: 4.03 MIL/uL (ref 3.87–5.11)
RDW: 14.1 % (ref 11.5–15.5)
WBC: 17 10*3/uL — AB (ref 4.0–10.5)

## 2015-10-26 LAB — TSH: TSH: 5.234 u[IU]/mL — AB (ref 0.350–4.500)

## 2015-10-26 NOTE — Progress Notes (Signed)
Assessment and Plan:  Fatigue- ? From urosepsis versus electrolyte imbalance, versus carcinoma- likely multifactorial, check labs.  AKI- recheck BMP Hypokalemia/magnesium- recheck BMP Chest pain- normal EKG- likely due to GERD/prednisone use- add nexium to dexilant, carafate QID, versus esophageal canidiasis, will treat with diflucan 100mg  x 14 days.  Diabetes- off metformin, increase glipizide to BID and monitor sugars, likely elevated due to prendnisone use.   Will check A1C next visit.  Over 30 minutes of exam, counseling, chart review, and critical decision making was performed   HPI 59 y.o. female  presents for 3 month follow up on hypertension, cholesterol, diabetes and vitamin D deficiency.   Her blood pressure has been controlled at home, today her BP is BP: 100/70 mmHg. Patient also has history of klatkin cholangiocarcinoma s/p whipple in 2009. She is following with [facility] for FUO, sees Dr. Leamon Arnt. Has peritoneal nodule that has increased in size, may start chem again after Jan.  She went to the hospital for urosepsis, treated for AKI/UTI, metformin was held but kidney function and sodium improved with fluids. She had normal renal US.  She states that she feels like she has heart burn, has pressure on her chest, when she drinks water it is very painful and feels like "jagged rock". She is on dexilant and carafate 4 x a day. Had normal EGD and colonoscopy in August, EGD normal. 3 polyps removed.  Sugars at the hospital were 600's, currently they are running 198/202, has been on prednisone since yesterday.  She is on 4 potassium a day and on magnesium, she did have feet cramping one night this week.   She does not workout. She denies chest pain, shortness of breath, dizziness.  She is on cholesterol medication and denies myalgias. Her cholesterol is at goal. The cholesterol was:   Lab Results  Component Value Date   CHOL 103* 07/25/2015   HDL 38* 07/25/2015   LDLCALC 49 07/25/2015    TRIG 81 07/25/2015   CHOLHDL 2.7 07/25/2015    She has been working on diet and exercise for diabetes with diabetic chronic kidney disease, she is on bASA, she is not on ACE/ARB, and denies  paresthesia of the feet, polydipsia, polyuria and visual disturbances. Last A1C was:  Lab Results  Component Value Date   HGBA1C 7.7* 07/25/2015    Patient is on Vitamin D supplement. Lab Results  Component Value Date   VD25OH 99 07/25/2015   She is on thyroid medication, she is on 100mg  one day and 200mg  the other day. Her medication was not changed last visit.   Lab Results  Component Value Date   TSH 3.006 07/25/2015  .    Current Medications:  Current Outpatient Prescriptions on File Prior to Visit  Medication Sig Dispense Refill  . albuterol (VENTOLIN HFA) 108 (90 BASE) MCG/ACT inhaler Inhale 2 puffs into the lungs every 4 (four) hours as needed for wheezing or shortness of breath. 1 Inhaler 4  . ALPRAZolam (XANAX) 1 MG tablet TAKE 1/2 TO 1 TABLET 3 TIMES A DAY (Patient taking differently: TAKE 1/2 TO 1 TABLET 3 TIMES A DAY AS NEEDED FOR ANXIETY) 270 tablet 1  . calcium citrate-vitamin D (CITRACAL+D) 315-200 MG-UNIT per tablet Take 1 tablet by mouth 3 (three) times daily.    . cetirizine (ZYRTEC) 10 MG tablet Take 10 mg by mouth every morning.      Marland Kitchen DEXILANT 60 MG capsule Take 60 mg by mouth daily.   3  . ferrous sulfate  325 (65 FE) MG tablet Take 325 mg by mouth daily with breakfast.    . fluticasone (FLONASE) 50 MCG/ACT nasal spray Place 2 sprays into both nostrils daily. (Patient taking differently: Place 2 sprays into both nostrils daily as needed for allergies. ) 16 g 2  . furosemide (LASIX) 40 MG tablet Take 1 to 2 tablets 2 x day for fluid & swelling (Patient taking differently: Take 80 mg by mouth daily. ) 90 tablet 1  . glipiZIDE (GLUCOTROL) 5 MG tablet Take 1 tablet (5 mg total) by mouth daily before breakfast. 30 tablet 0  . levofloxacin (LEVAQUIN) 500 MG tablet Take 1 tablet (500  mg total) by mouth daily. 10 tablet 0  . levothyroxine (SYNTHROID, LEVOTHROID) 200 MCG tablet Take 100-200 mcg by mouth daily before breakfast. 0.5 - 1 tablet daily alternate 100 mg with 200 mg    . lipase/protease/amylase (CREON) 12000 UNITS CPEP capsule Take 1 capsule (12,000 Units total) by mouth 3 (three) times daily. 270 capsule 99  . Magnesium 500 MG TABS Take 1 tablet 2 to 4 x day as directed (Patient taking differently: Take 1,000 mg by mouth daily. )    . Multiple Vitamins-Minerals (MULTIVITAMIN WITH MINERALS) tablet Take 1 tablet by mouth daily.     . ONGLYZA 5 MG TABS tablet Take 5 mg by mouth daily.  11  . potassium chloride SA (K-DUR,KLOR-CON) 20 MEQ tablet Take 20 mEq by mouth 4 (four) times daily.     . predniSONE (DELTASONE) 20 MG tablet 1 tab 3 x day for 3 days, then 1 tab 2 x day for 3 days, then 1 tab 1 x day for 5 days 20 tablet 0  . sucralfate (CARAFATE) 1 G tablet TAKE 1 TABLET BY MOUTH 4 TIMES DAILY. 120 tablet 1  . Vitamin D, Ergocalciferol, (DRISDOL) 50000 UNITS CAPS capsule Take 1 capsule (50,000 Units total) by mouth daily. 90 capsule 1   No current facility-administered medications on file prior to visit.   Medical History:  Past Medical History  Diagnosis Date  . GERD (gastroesophageal reflux disease)   . Diabetes mellitus   . Hypertension   . Hypothyroid   . Hyperlipidemia   . Asthma   . Hepatitis   . Anxiety   . Anemia     History of blood transfusion which is when pt reports being exposed to hepatitis in 2009.  . CKD (chronic kidney disease), stage II   . Cancer ([place])     Klatkin bile duct ca (2009)    Allergies:  Allergies  Allergen Reactions  . Ace Inhibitors Anaphylaxis  . Tilade [Nedocromil] Anaphylaxis  . Reglan [Metoclopramide] Nausea And Vomiting  . Betadine [Povidone Iodine] Rash    iching      Review of Systems:  Review of Systems  Constitutional: Positive for malaise/fatigue. Negative for fever, chills, weight loss and diaphoresis.   HENT: Negative.   Respiratory: Negative.   Cardiovascular: Positive for leg swelling. Negative for chest pain, palpitations, orthopnea, claudication and PND.  Gastrointestinal: Positive for heartburn, nausea and vomiting. Negative for abdominal pain, diarrhea, constipation, blood in stool and melena.  Genitourinary: Negative.   Musculoskeletal: Negative.   Skin: Negative.   Neurological: Negative.  Negative for weakness.  Psychiatric/Behavioral: Negative.     Family history- Review and unchanged Social history- Review and unchanged Physical Exam: BP 100/70 mmHg  Pulse 95  Temp(Src) 97.5 F (36.4 C) (Temporal)  Resp 14  Ht 5\' 7"  (1.702 m)  Wt 149 lb (  67.586 kg)  BMI 23.33 kg/m2  SpO2 98% Wt Readings from Last 3 Encounters:  10/26/15 149 lb (67.586 kg)  10/18/15 158 lb (71.668 kg)  10/16/15 159 lb 6.4 oz (72.303 kg)   General Appearance: Well nourished, in no apparent distress. Eyes: PERRLA, EOMs, conjunctiva no swelling or erythema Sinuses: No Frontal/maxillary tenderness ENT/Mouth: Ext aud canals clear, TMs without erythema, bulging. No erythema, swelling, or exudate on post pharynx.  Tonsils not swollen or erythematous. Hearing normal.  Neck: Supple, thyroid normal.  Respiratory: Respiratory effort normal, BS equal bilaterally without rales, rhonchi, wheezing or stridor.  Cardio: RRR with no MRGs. Brisk peripheral pulses without edema.  Abdomen: Soft, + BS. + tenderness diffusely worse epigastric, no guarding, rebound, hernias, masses. Lymphatics: Non tender without lymphadenopathy.  Musculoskeletal: Full ROM, 5/5 strength, Normal gait Skin: Warm, dry without rashes, lesions, ecchymosis.  Neuro: Cranial nerves intact. No cerebellar symptoms.  Psych: Awake and oriented X 3, normal affect, Insight and Judgment appropriate.    Vicie Mutters, PA-C 2:19 PM [facility]

## 2015-10-27 LAB — HEPATIC FUNCTION PANEL
ALBUMIN: 3 g/dL — AB (ref 3.6–5.1)
ALK PHOS: 376 U/L — AB (ref 33–130)
ALT: 74 U/L — ABNORMAL HIGH (ref 6–29)
AST: 28 U/L (ref 10–35)
BILIRUBIN TOTAL: 0.5 mg/dL (ref 0.2–1.2)
Bilirubin, Direct: 0.1 mg/dL (ref <=0.2)
Indirect Bilirubin: 0.4 mg/dL (ref 0.2–1.2)
Total Protein: 6.2 g/dL (ref 6.1–8.1)

## 2015-10-27 LAB — BASIC METABOLIC PANEL WITH GFR
BUN: 20 mg/dL (ref 7–25)
CALCIUM: 8.8 mg/dL (ref 8.6–10.4)
CO2: 37 mmol/L — ABNORMAL HIGH (ref 20–31)
Chloride: 88 mmol/L — ABNORMAL LOW (ref 98–110)
Creat: 1.64 mg/dL — ABNORMAL HIGH (ref 0.50–0.99)
GFR, EST AFRICAN AMERICAN: 39 mL/min — AB (ref >=60)
GFR, Est Non African American: 34 mL/min — ABNORMAL LOW (ref >=60)
Glucose, Bld: 427 mg/dL — ABNORMAL HIGH (ref 65–99)
POTASSIUM: 3.5 mmol/L (ref 3.5–5.3)
Sodium: 133 mmol/L — ABNORMAL LOW (ref 135–146)

## 2015-10-27 LAB — MAGNESIUM: MAGNESIUM: 1.9 mg/dL (ref 1.5–2.5)

## 2015-10-27 MED ORDER — GLIPIZIDE 5 MG PO TABS
5.0000 mg | ORAL_TABLET | Freq: Three times a day (TID) | ORAL | Status: DC | PRN
Start: 2015-10-27 — End: 2015-11-15

## 2015-10-27 MED ORDER — FLUCONAZOLE 100 MG PO TABS: 100.0000 mg | ORAL_TABLET | Freq: Every day | ORAL | Status: DC

## 2015-10-27 MED ORDER — CEFUROXIME AXETIL 250 MG PO TABS: 250.0000 mg | ORAL_TABLET | Freq: Two times a day (BID) | ORAL | Status: DC

## 2015-10-27 NOTE — Addendum Note (Signed)
Addended by: Vicie Mutters R on: 10/27/2015 02:20 PM   Modules accepted: Orders

## 2015-10-29 ENCOUNTER — Encounter: Payer: Self-pay | Admitting: Physician Assistant

## 2015-10-30 ENCOUNTER — Ambulatory Visit (INDEPENDENT_AMBULATORY_CARE_PROVIDER_SITE_OTHER): Payer: 59 | Admitting: Internal Medicine

## 2015-10-30 ENCOUNTER — Encounter: Payer: Self-pay | Admitting: Internal Medicine

## 2015-10-30 VITALS — BP 104/60 | HR 76 | Temp 97.8°F | Resp 16 | Ht 67.0 in | Wt 150.0 lb

## 2015-10-30 DIAGNOSIS — I809 Phlebitis and thrombophlebitis of unspecified site: Secondary | ICD-10-CM | POA: Diagnosis not present

## 2015-10-30 DIAGNOSIS — T148XXA Other injury of unspecified body region, initial encounter: Secondary | ICD-10-CM

## 2015-10-30 DIAGNOSIS — T148 Other injury of unspecified body region: Secondary | ICD-10-CM

## 2015-10-30 MED ORDER — MUPIROCIN 2 % EX OINT: 1.0000 "application " | TOPICAL_OINTMENT | Freq: Every day | CUTANEOUS | Status: DC

## 2015-10-30 MED ORDER — TRAMADOL HCL 50 MG PO TABS: 50.0000 mg | ORAL_TABLET | Freq: Four times a day (QID) | ORAL | Status: DC | PRN

## 2015-10-30 MED ORDER — AMOXICILLIN-POT CLAVULANATE 875-125 MG PO TABS: 1.0000 | ORAL_TABLET | Freq: Two times a day (BID) | ORAL | Status: DC

## 2015-10-30 NOTE — Patient Instructions (Signed)
Phlebitis Phlebitis is soreness and swelling (inflammation) of a vein. This can occur in your arms, legs, or torso (trunk), as well as deeper inside your body. Phlebitis is usually not serious when it occurs close to the surface of the body. However, it can cause serious problems when it occurs in a vein deeper inside the body. CAUSES  Phlebitis can be triggered by various things, including:   Reduced blood flow through your veins. This can happen with:  Bed rest over a long period.  Long-distance travel.  Injury.  Surgery.  Being overweight (obese) or pregnant.  Having an IV tube put in the vein and getting certain medicines through the vein.  Cancer and cancer treatment.  Use of illegal drugs taken through the vein.  Inflammatory diseases.  Inherited (genetic) diseases that increase the risk of blood clots.  Hormone therapy, such as birth control pills. SIGNS AND SYMPTOMS   Red, tender, swollen, and painful area on your skin. Usually, the area will be long and narrow.  Firmness along the center of the affected area. This can indicate that a blood clot has formed.  Low-grade fever. DIAGNOSIS  A health care provider can usually diagnose phlebitis by examining the affected area and asking about your symptoms. To check for infection or blood clots, your health care provider may order blood tests or an ultrasound exam of the area. Blood tests and your family history may also indicate if you have an underlying genetic disease that causes blood clots. Occasionally, a piece of tissue is taken from the body (biopsy sample) if an unusual cause of phlebitis is suspected. TREATMENT  Treatment will vary depending on the severity of the condition and the area of the body affected. Treatment may include:  Use of a warm compress or heating pad.  Use of compression stockings or bandages.  Anti-inflammatory medicines.  Removal of any IV tube that may be causing the problem.  Medicines  that kill germs (antibiotics) if an infection is present.  Blood-thinning medicines if a blood clot is suspected or present.  In rare cases, surgery may be needed to remove damaged sections of vein. HOME CARE INSTRUCTIONS   Only take over-the-counter or prescription medicines as directed by your health care provider. Take all medicines exactly as prescribed.  Raise (elevate) the affected area above the level of your heart as directed by your health care provider.  Apply a warm compress or heating pad to the affected area as directed by your health care provider. Do not sleep with the heating pad.  Use compression stockings or bandages as directed. These will speed healing and prevent the condition from coming back.  If you are on blood thinners:  Get follow-up blood tests as directed by your health care provider.  Check with your health care provider before using any new medicines.  Carry a medical alert card or wear your medical alert jewelry to show that you are on blood thinners.  For phlebitis in the legs:  Avoid prolonged standing or bed rest.  Keep your legs moving. Raise your legs when sitting or lying.  Do not smoke.  Women, particularly those over the age of 35, should consider the risks and benefits of taking the contraceptive pill. This kind of hormone treatment can increase your risk for blood clots.  Follow up with your health care provider as directed. SEEK MEDICAL CARE IF:   You have unusual bruising or any bleeding problems.  Your swelling or pain in the affected area   is not improving.  You are on anti-inflammatory medicine, and you develop belly (abdominal) pain. SEEK IMMEDIATE MEDICAL CARE IF:   You have a sudden onset of chest pain or difficulty breathing.  You have a fever or persistent symptoms for more than 2-3 days.  You have a fever and your symptoms suddenly get worse. MAKE SURE YOU:  Understand these instructions.  Will watch your  condition.  Will get help right away if you are not doing well or get worse.   This information is not intended to replace advice given to you by your health care provider. Make sure you discuss any questions you have with your health care provider.   Document Released: 10/29/2001 Document Revised: 08/25/2013 Document Reviewed: 07/12/2013 Elsevier Interactive Patient Education 2016 [employer].  Conservator, museum/gallery bites can range from mild to serious. An animal bite can result in a scratch on the skin, a deep open cut, a puncture of the skin, a crush injury, or tearing away of the skin or a body part. A small bite from a house pet will usually not cause serious problems. However, some animal bites can become infected or injure a bone or other tissue.  Bites from certain animals can be more dangerous because of the risk of spreading rabies, which is a serious viral infection. This risk is higher with bites from stray animals or wild animals, such as raccoons, foxes, skunks, and bats. Dogs are responsible for most animal bites. Children are bitten more often than adults. SYMPTOMS  Common symptoms of an animal bite include:   Pain.   Bleeding.   Swelling.   Bruising.  DIAGNOSIS  This condition may be diagnosed based on a physical exam and medical history. Your health care provider will examine the wound and ask for details about the animal and how the bite happened. You may also have tests, such as:   Blood tests to check for infection or to determine if surgery is needed.  X-rays to check for damage to bones or joints.  Culture test. This uses a sample of fluid from the wound to check for infection. TREATMENT  Treatment varies depending on the location and type of animal bite and your medical history. Treatment may include:   Wound care. This often includes cleaning the wound, flushing the wound with saline solution, and applying a bandage (dressing). Sometimes, the wound is left  open to heal because of the high risk of infection. However, in some cases, the wound may be closed with stitches (sutures), staples, skin glue, or adhesive strips.   Antibiotic medicine.   Tetanus shot.   Rabies treatment if the animal could have rabies.  In some cases, bites that have become infected may require IV antibiotics and surgical treatment in the hospital.  [place]  Follow instructions from your health care provider about how to take care of your wound. Make sure you:  Wash your hands with soap and water before you change your dressing. If soap and water are not available, use hand sanitizer.  Change your dressing as told by your health care provider.  Leave sutures, skin glue, or adhesive strips in place. These skin closures may need to be in place for 2 weeks or longer. If adhesive strip edges start to loosen and curl up, you may trim the loose edges. Do not remove adhesive strips completely unless your health care provider tells you to do that.  Check your wound every day for signs  of infection. Watch for:   Increasing redness, swelling, or pain.   Fluid, blood, or pus.  General Instructions  Take or apply over-the-counter and prescription medicines only as told by your health care provider.   If you were prescribed an antibiotic, take or apply it as told by your health care provider. Do not stop using the antibiotic even if your condition improves.   Keep the injured area raised (elevated) above the level of your heart while you are sitting or lying down, if this is possible.   If directed, apply ice to the injured area.   Put ice in a plastic bag.   Place a towel between your skin and the bag.   Leave the ice on for 20 minutes, 2-3 times per day.   Keep all follow-up visits as told by your health care provider. This is important.  SEEK MEDICAL CARE IF:  You have increasing redness, swelling, or pain at the  site of your wound.   You have a general feeling of sickness (malaise).   You feel nauseous or you vomit.   You have pain that does not get better.  SEEK IMMEDIATE MEDICAL CARE IF:  You have a red streak extending away from your wound.   You have fluid, blood, or pus coming from your wound.   You have a fever or chills.   You have trouble moving your injured area.   You have numbness or tingling extending beyond the wound.   This information is not intended to replace advice given to you by your health care provider. Make sure you discuss any questions you have with your health care provider.   Document Released: 07/23/2011 Document Revised: 07/26/2015 Document Reviewed: 03/22/2015 Elsevier Interactive Patient Education [employer].

## 2015-10-30 NOTE — Progress Notes (Signed)
   Subjective:    Patient ID: Courtney Stevens, female    DOB: [birth year], 59 y.o.   MRN: [id number]  HPI  Patient presents to the office for evaluation of right forearm pain secondary to IV site during hospital admission on 11/30-12/2/16. She reports that the arm is hot and painful and she reports that it was so bad that she had to go ahead and take aleve pm.  She also reports that she was bit by a mouse on the left thumb on Saturday evening.  She reports that she is up to date on all her vaccinations.   Review of Systems  Constitutional: Negative for fever, chills and fatigue.  Gastrointestinal: Positive for vomiting. Negative for nausea.  Skin: Positive for color change and wound.       Objective:   Physical Exam  Constitutional: She is oriented to person, place, and time. She appears well-developed and well-nourished. No distress.  HENT:  Head: Normocephalic.  Mouth/Throat: Oropharynx is clear and moist. No oropharyngeal exudate.  Eyes: Conjunctivae are normal. No scleral icterus.  Neck: Normal range of motion. Neck supple. No JVD present. No thyromegaly present.  Cardiovascular: Normal rate, regular rhythm, normal heart sounds and intact distal pulses.  Exam reveals no gallop and no friction rub.   No murmur heard. Pulmonary/Chest: Effort normal and breath sounds normal. No respiratory distress. She has no wheezes. She has no rales. She exhibits no tenderness.  Abdominal: Soft. Normal appearance and bowel sounds are normal. She exhibits no distension and no mass. There is no tenderness. There is no rebound and no guarding.  Musculoskeletal: Normal range of motion.  Lymphadenopathy:    She has no cervical adenopathy.  Neurological: She is alert and oriented to person, place, and time.  Skin: Skin is warm and dry. She is not diaphoretic.     Psychiatric: She has a normal mood and affect. Her behavior is normal. Judgment and thought content normal.  Nursing note and vitals  reviewed.   Filed Vitals:   10/30/15 1419  BP: 104/60  Pulse: 76  Temp: 97.8 F (36.6 C)  Resp: 16        Assessment & Plan:    1. Phlebitis -possible phlebitis vs. Cellulitis -change to augmentin for coverage with recent animal bite -heat -cannot start NSAIDs or ASA given history of recent AKI  2. Animal bite -tetanus UTD -augmentin -bactroban

## 2015-11-05 ENCOUNTER — Other Ambulatory Visit: Payer: Self-pay | Admitting: Internal Medicine

## 2015-11-15 ENCOUNTER — Other Ambulatory Visit: Payer: Self-pay | Admitting: Internal Medicine

## 2015-11-15 ENCOUNTER — Telehealth: Payer: Self-pay | Admitting: *Deleted

## 2015-11-15 DIAGNOSIS — E1121 Type 2 diabetes mellitus with diabetic nephropathy: Secondary | ICD-10-CM

## 2015-11-15 MED ORDER — GLIPIZIDE 5 MG PO TABS: ORAL_TABLET | ORAL | Status: DC

## 2015-11-15 NOTE — Telephone Encounter (Signed)
Patient was told to stop Metformin while in the hospital and was startd on Glipizide.  Per Dr Melford Aase, continue Glipizide and refill was sent in.  Patient was advised of information per VM.

## 2015-11-25 IMAGING — CT CT HEAD W/O CM
2 series · 15 of 30 positions shown, 19 images · non-contrast
Comparison: PET-CT -06/12/2009

CLINICAL DATA: Headache, no known injury; history of adenocarcinoma
of the bile duct

EXAM:
CT HEAD WITHOUT CONTRAST
TECHNIQUE: Contiguous axial images were obtained from the base of the skull
through the vertex without intravenous contrast.

[Series 2: head w/o · axial · non-contrast · 0.48mm/px · z∈[-168,-43]mm · 13 of 31 slices shown, 17 images]
[im 3/31  brain]
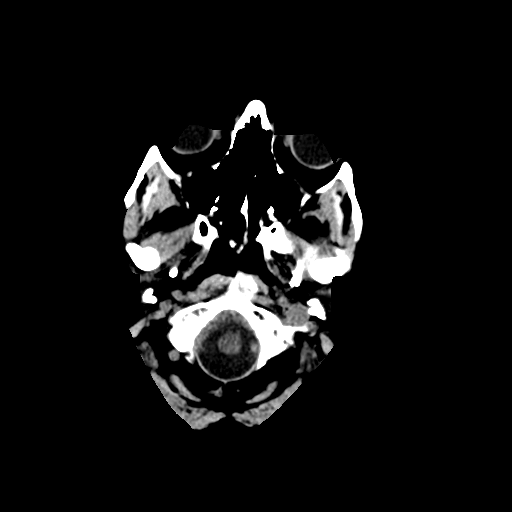
[im 3/31  bone]
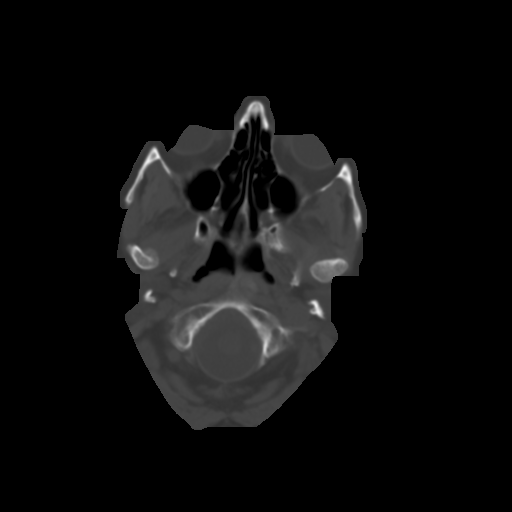
[im 5/31  brain]
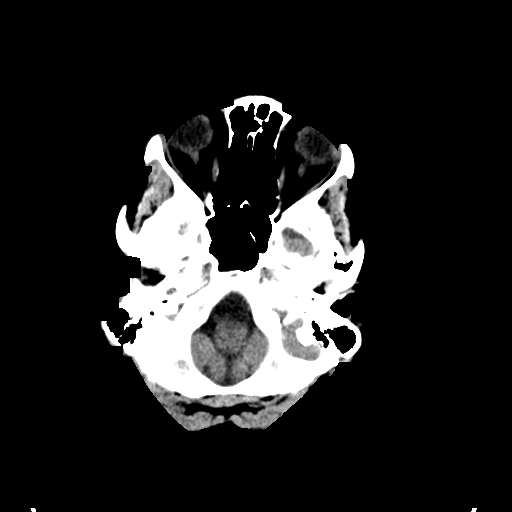
[im 7/31  brain]
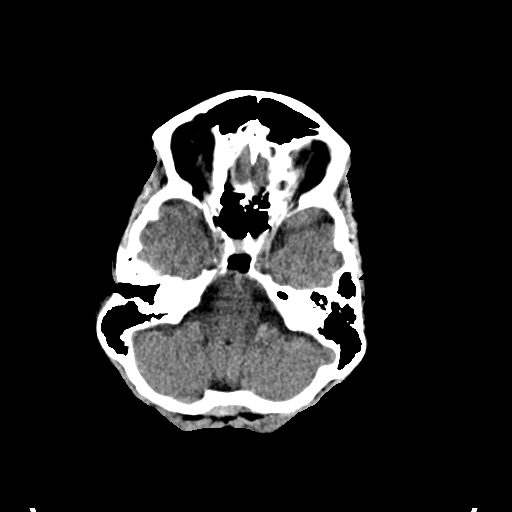
[im 9/31  brain]
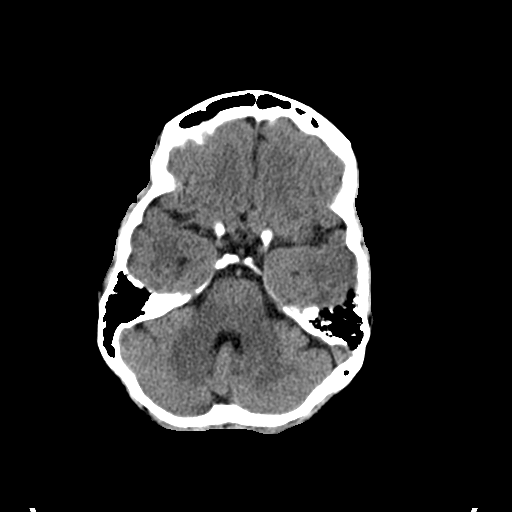
[im 11/31  brain]
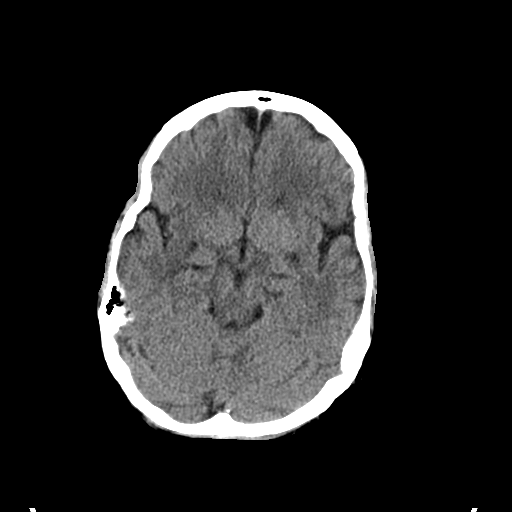
[im 11/31  bone]
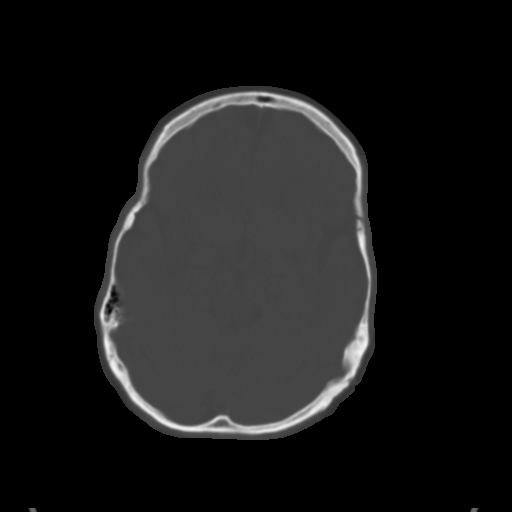
[im 13/31  brain]
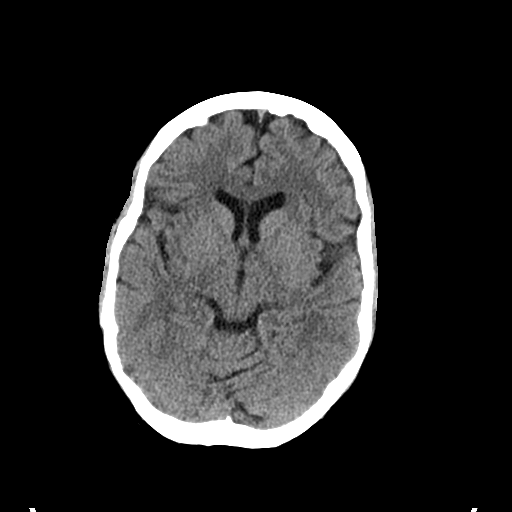
[im 16/31  brain]
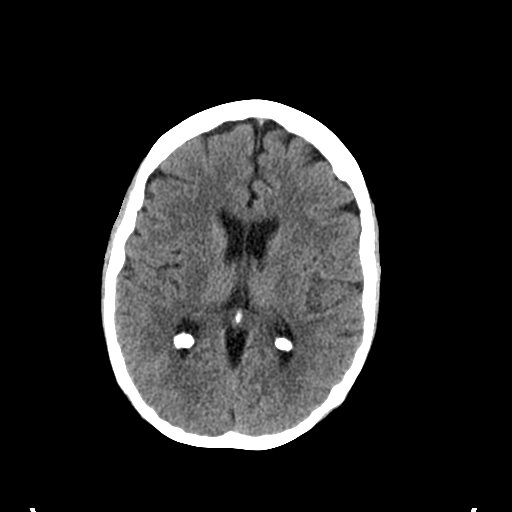
[im 18/31  brain]
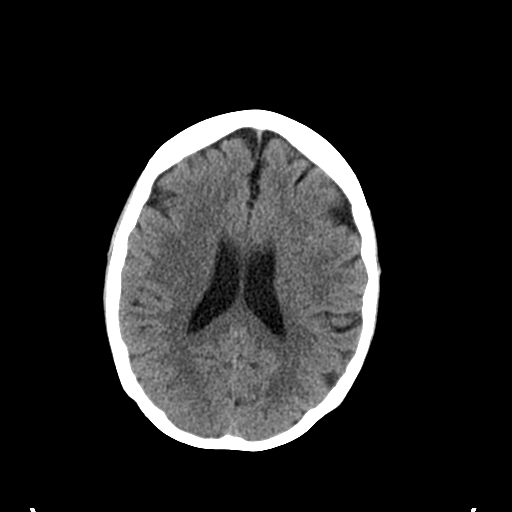
[im 20/31  brain]
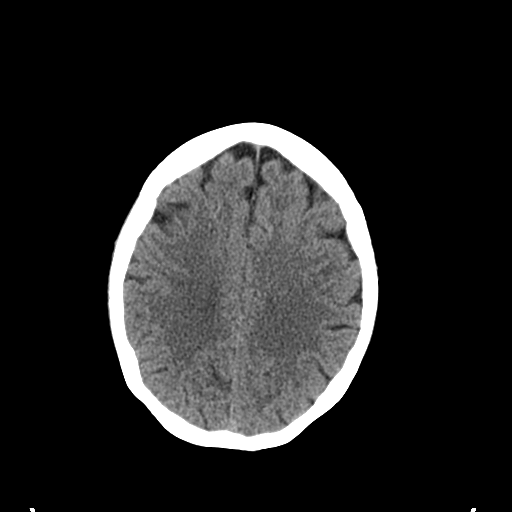
[im 20/31  bone]
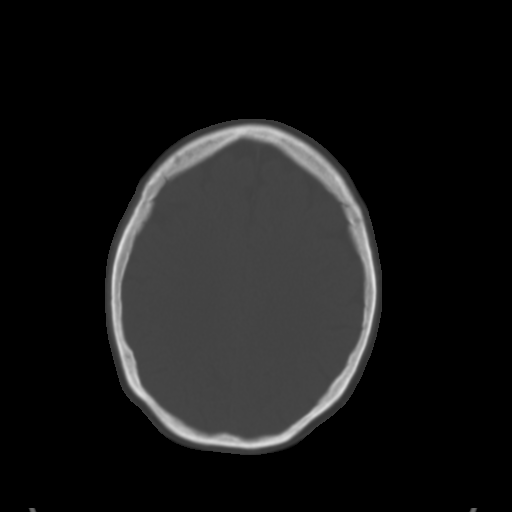
[im 22/31  brain]
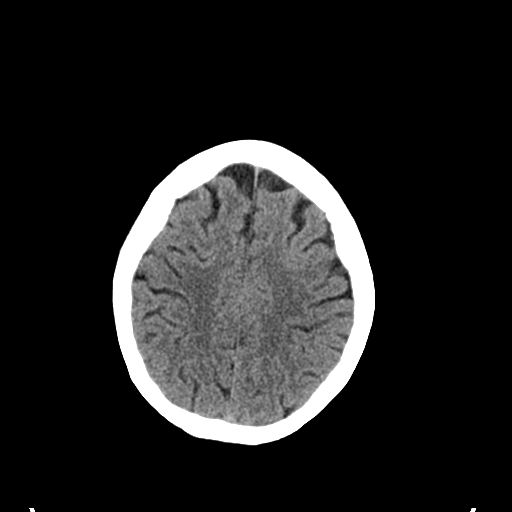
[im 24/31  brain]
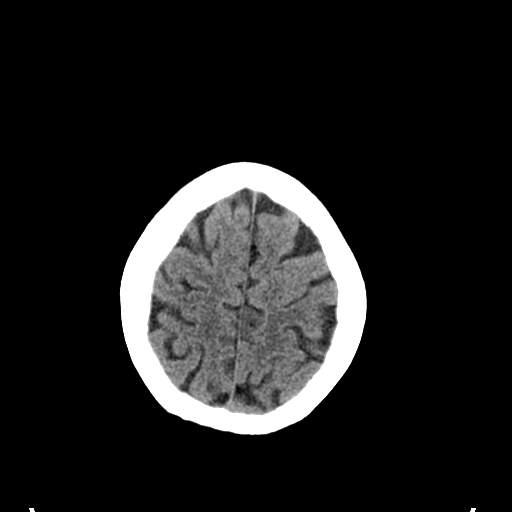
[im 26/31  brain]
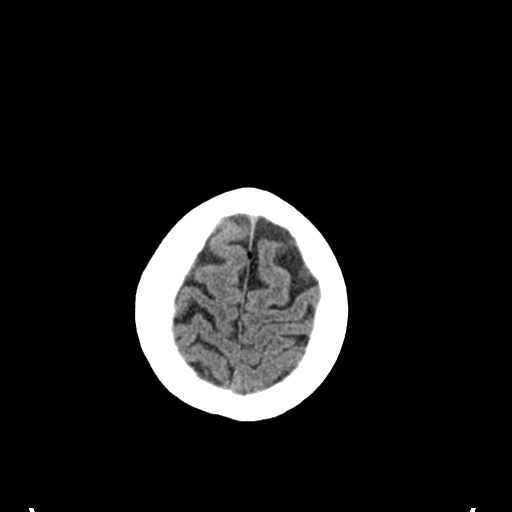
[im 28/31  brain]
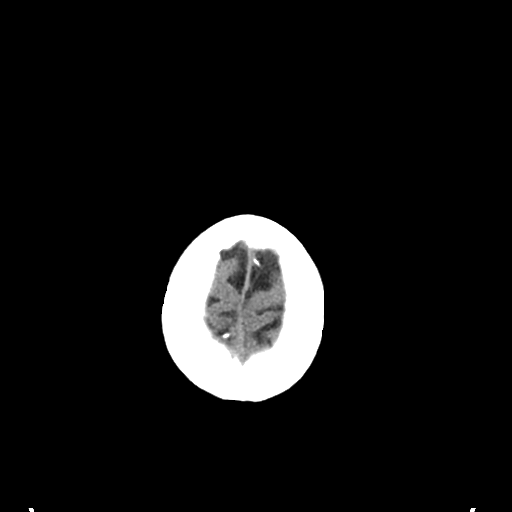
[im 28/31  bone]
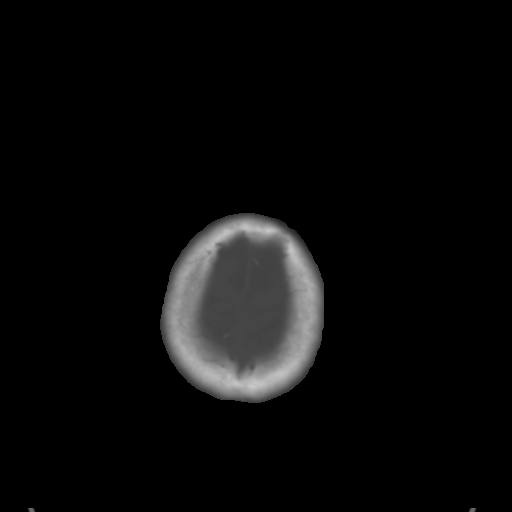

[Series 3: bone windows · axial · 0.48mm/px · z∈[-168,-148]mm · 2 of 31 slices shown]
[im 3/31  bone]
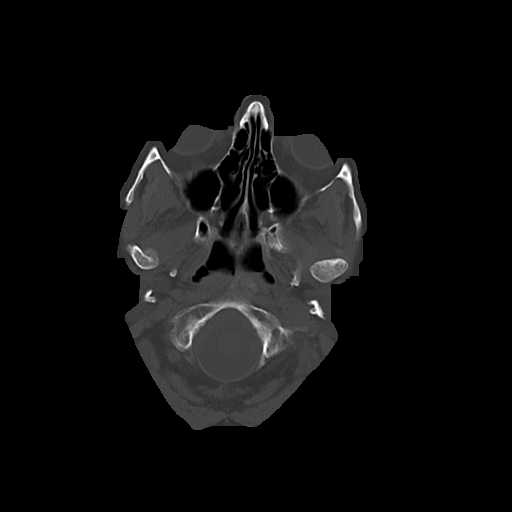
[im 7/31  bone]
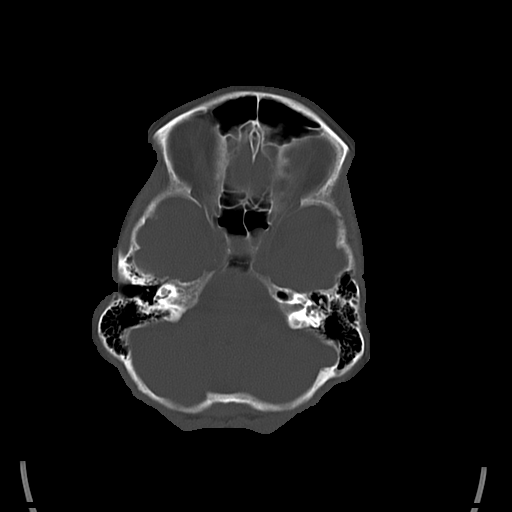

[15 of 30 positions shown; findings below may reference images not displayed]

FINDINGS: Gray-white differentiation is maintained. Incidental note is made of
prominent VR spaces within the caudal medial aspects of the
bilateral temporal lobes. No CT evidence for acute large territory
infarct. No intraparenchymal or extra-axial mass or hemorrhage.
Normal size and configuration of the ventricles and basilar
cisterns. No midline. Incidental note is made of a punctate
(approximately 0.4 x 0.3 cm) calcified osteoid osteoma is noted
within the left frontal sinus. The remaining paranasal sinuses
mastoid air cells appear normally aerated. Regional soft tissues
appear normal. No displaced calvarial fracture.
IMPRESSION: Negative noncontrast head CT.

## 2015-12-12 ENCOUNTER — Other Ambulatory Visit: Payer: Self-pay | Admitting: Internal Medicine

## 2015-12-14 ENCOUNTER — Encounter: Payer: Self-pay | Admitting: Internal Medicine

## 2015-12-14 ENCOUNTER — Ambulatory Visit (INDEPENDENT_AMBULATORY_CARE_PROVIDER_SITE_OTHER): Payer: 59 | Admitting: Internal Medicine

## 2015-12-14 VITALS — BP 114/72 | HR 72 | Temp 97.5°F | Resp 16 | Ht 67.5 in | Wt 159.2 lb

## 2015-12-14 DIAGNOSIS — J042 Acute laryngotracheitis: Secondary | ICD-10-CM | POA: Diagnosis not present

## 2015-12-14 MED ORDER — LEVOFLOXACIN 500 MG PO TABS: ORAL_TABLET | ORAL | Status: DC

## 2015-12-14 MED ORDER — BENZONATATE 200 MG PO CAPS: ORAL_CAPSULE | ORAL | Status: DC

## 2015-12-14 MED ORDER — PREDNISONE 20 MG PO TABS: ORAL_TABLET | ORAL | Status: DC

## 2015-12-14 NOTE — Progress Notes (Signed)
Subjective:    Patient ID: Courtney Stevens, female    DOB: [birth year], 60 y.o.   MRN: [id number]  HPI This very nice , but unfortunate 60 yo MWF with a recurrent Klatkin Biliary Ca presents today with head/chest congestion and productive thick yellow sputum.  (+) intermittent fever, chills, but no dyspnea.  Outpatient Prescriptions Prior to Visit  Medication Sig Dispense Refill  . albuterol (VENTOLIN HFA) 108 (90 BASE) MCG/ACT inhaler Inhale 2 puffs into the lungs every 4 (four) hours as needed for wheezing or shortness of breath. 1 Inhaler 4  . ALPRAZolam (XANAX) 1 MG tablet TAKE 1/2 TO 1 TABLET 3 TIMES A DAY (Patient taking differently: TAKE 1/2 TO 1 TABLET 3 TIMES A DAY AS NEEDED FOR ANXIETY) 270 tablet 1  . calcium citrate-vitamin D (CITRACAL+D) 315-200 MG-UNIT per tablet Take 1 tablet by mouth 3 (three) times daily.    . cetirizine (ZYRTEC) 10 MG tablet Take 10 mg by mouth every morning.      Marland Kitchen DEXILANT 60 MG capsule Take 60 mg by mouth daily.   3  . ferrous sulfate 325 (65 FE) MG tablet Take 325 mg by mouth daily with breakfast.    . fluticasone (FLONASE) 50 MCG/ACT nasal spray Place 2 sprays into both nostrils daily. (Patient taking differently: Place 2 sprays into both nostrils daily as needed for allergies. ) 16 g 2  . furosemide (LASIX) 40 MG tablet Take 1 to 2 tablets 2 x day for fluid & swelling (Patient taking differently: Take 80 mg by mouth daily. ) 90 tablet 1  . glipiZIDE (GLUCOTROL) 5 MG tablet Take 1/2 to 1 tab 3 x day if needed for elevated glucose 270 tablet 1  . KLOR-CON M20 20 MEQ tablet TAKE 4 TABLETS BY MOUTH FOUR TIMES A DAY AS DIRECTED 390 tablet 4  . levothyroxine (SYNTHROID, LEVOTHROID) 200 MCG tablet Take 100-200 mcg by mouth daily before breakfast. 0.5 - 1 tablet daily alternate 100 mg with 200 mg    . lipase/protease/amylase (CREON) 12000 UNITS CPEP capsule Take 1 capsule (12,000 Units total) by mouth 3 (three) times daily. 270 capsule 99  . Magnesium 500 MG TABS  Take 1 tablet 2 to 4 x day as directed (Patient taking differently: Take 1,000 mg by mouth daily. )    . Multiple Vitamins-Minerals (MULTIVITAMIN WITH MINERALS) tablet Take 1 tablet by mouth daily.     . ONGLYZA 5 MG TABS tablet Take 5 mg by mouth daily.  11  . sucralfate (CARAFATE) 1 g tablet TAKE 1 TABLET BY MOUTH 4 TIMES DAILY. 360 tablet 1  . Vitamin D, Ergocalciferol, (DRISDOL) 50000 UNITS CAPS capsule Take 1 capsule (50,000 Units total) by mouth daily. 90 capsule 1  . amoxicillin-clavulanate (AUGMENTIN) 875-125 MG tablet Take 1 tablet by mouth 2 (two) times daily. One po bid x 7 days 14 tablet 0  . mupirocin ointment (BACTROBAN) 2 % Place 1 application into the nose daily. 22 g 0  . traMADol (ULTRAM) 50 MG tablet Take 1 tablet (50 mg total) by mouth every 6 (six) hours as needed for moderate pain or severe pain. 30 tablet 0   No facility-administered medications prior to visit.   Allergies  Allergen Reactions  . Ace Inhibitors Anaphylaxis  . Tilade [Nedocromil] Anaphylaxis  . Reglan [Metoclopramide] Nausea And Vomiting  . Betadine [Povidone Iodine] Rash    iching    Past Medical History  Diagnosis Date  . GERD (gastroesophageal reflux disease)   . Diabetes  mellitus   . Hypertension   . Hypothyroid   . Hyperlipidemia   . Asthma   . Hepatitis   . Anxiety   . Anemia     History of blood transfusion which is when pt reports being exposed to hepatitis in 2009.  . CKD (chronic kidney disease), stage II   . Cancer ([place])     Klatkin bile duct ca (2009)    Past Surgical History  Procedure Laterality Date  . Gastric bypass  2004  . Cholecystectomy  1992  . Bile duct resection  2009  . Appendectomy    . Tonsillectomy    . Abdominal hysterectomy  1990    BIL OOPHORECTOMY  . Tubal ligation    . Breast surgery      BIOPSY NEG  . Bunionectomy Right 2005  . Video bronchoscopy with endobronchial ultrasound N/A 10/12/2014    Procedure: VIDEO BRONCHOSCOPY WITH ENDOBRONCHIAL  ULTRASOUND;  Surgeon: Brand Males, MD;  Location: [facility] OR;  Service: Thoracic;  Laterality: N/A;   Review of Systems  10 point systems review negative except as above.    Objective:   Physical Exam  BP 114/72   Pulse 72  Temp 97.5 F   Resp 16  Ht 5' 7.5"   Wt 159 lb 3.2 oz   BMI 24.55    O2 sat 93-94% rm air at rest  HEENT - Eac's patent. TM's Nl. EOM's full. PERRLA. NasoOroPharynx clear. Neck - supple. Nl Thyroid. Carotids 2+ & No bruits, nodes, JVD Chest - Distant BS w/scattered coarse  Rales & rhonchi with no wheezes. Cor - Nl HS. RRR w/o sig MGR.  No edema. Abd - No palpable organomegaly, masses or tenderness. BS nl. MS- FROM w/o deformities. Muscle power, tone and bulk Nl. Gait Nl. Neuro - No obvious Cr N abnormalities. Sensory, motor and Cerebellar functions appear Nl w/o focal abnormalities. Psyche - Mental status normal & appropriate.  No delusions, ideations or obvious mood abnormalities.    Assessment & Plan:   1. Laryngotracheitis  - predniSONE (DELTASONE) 20 MG tablet; 1 tab 3 x day for 3 days, then 1 tab 2 x day for 3 days, then 1 tab 1 x day for 5 days  Dispense: 20 tablet; Refill: 0 - levofloxacin (LEVAQUIN) 500 MG tablet; Take 1 tablet daily with food for respiratory infection  Dispense: 15 tablet; Refill: 1 - benzonatate (TESSALON) 200 MG capsule; Take 1 capsule 3 x day to prevent cough  Dispense: 50 capsule; Refill: 1  - discussed meds/SE  - ROV - prn

## 2015-12-26 ENCOUNTER — Other Ambulatory Visit: Payer: Self-pay | Admitting: Internal Medicine

## 2015-12-26 DIAGNOSIS — E119 Type 2 diabetes mellitus without complications: Secondary | ICD-10-CM

## 2015-12-26 MED ORDER — METFORMIN HCL ER 500 MG PO TB24: ORAL_TABLET | ORAL | Status: DC

## 2015-12-27 ENCOUNTER — Ambulatory Visit (INDEPENDENT_AMBULATORY_CARE_PROVIDER_SITE_OTHER): Payer: 59 | Admitting: Internal Medicine

## 2015-12-27 ENCOUNTER — Encounter: Payer: Self-pay | Admitting: Internal Medicine

## 2015-12-27 VITALS — BP 116/80 | HR 68 | Temp 97.8°F | Resp 16 | Ht 67.0 in | Wt 155.8 lb

## 2015-12-27 DIAGNOSIS — E1165 Type 2 diabetes mellitus with hyperglycemia: Secondary | ICD-10-CM | POA: Diagnosis not present

## 2015-12-27 DIAGNOSIS — Z79899 Other long term (current) drug therapy: Secondary | ICD-10-CM | POA: Diagnosis not present

## 2015-12-27 DIAGNOSIS — N390 Urinary tract infection, site not specified: Secondary | ICD-10-CM

## 2015-12-27 LAB — CBC WITH DIFFERENTIAL/PLATELET
BASOS PCT: 0 % (ref 0–1)
Basophils Absolute: 0 10*3/uL (ref 0.0–0.1)
EOS PCT: 1 % (ref 0–5)
Eosinophils Absolute: 0.1 10*3/uL (ref 0.0–0.7)
HEMATOCRIT: 45 % (ref 36.0–46.0)
Hemoglobin: 15.1 g/dL — ABNORMAL HIGH (ref 12.0–15.0)
LYMPHS PCT: 20 % (ref 12–46)
Lymphs Abs: 2 10*3/uL (ref 0.7–4.0)
MCH: 30 pg (ref 26.0–34.0)
MCHC: 33.6 g/dL (ref 30.0–36.0)
MCV: 89.3 fL (ref 78.0–100.0)
MONO ABS: 0.6 10*3/uL (ref 0.1–1.0)
MPV: 9.7 fL (ref 8.6–12.4)
Monocytes Relative: 6 % (ref 3–12)
Neutro Abs: 7.3 10*3/uL (ref 1.7–7.7)
Neutrophils Relative %: 73 % (ref 43–77)
Platelets: 242 10*3/uL (ref 150–400)
RBC: 5.04 MIL/uL (ref 3.87–5.11)
RDW: 13.7 % (ref 11.5–15.5)
WBC: 10 10*3/uL (ref 4.0–10.5)

## 2015-12-27 LAB — BASIC METABOLIC PANEL WITH GFR
BUN: 22 mg/dL (ref 7–25)
CALCIUM: 10.3 mg/dL (ref 8.6–10.4)
CO2: 28 mmol/L (ref 20–31)
Chloride: 94 mmol/L — ABNORMAL LOW (ref 98–110)
Creat: 1.23 mg/dL — ABNORMAL HIGH (ref 0.50–0.99)
GFR, EST AFRICAN AMERICAN: 55 mL/min — AB (ref >=60)
GFR, EST NON AFRICAN AMERICAN: 48 mL/min — AB (ref >=60)
GLUCOSE: 198 mg/dL — AB (ref 65–99)
Potassium: 3.7 mmol/L (ref 3.5–5.3)
Sodium: 135 mmol/L (ref 135–146)

## 2015-12-27 LAB — HEPATIC FUNCTION PANEL
ALBUMIN: 4.1 g/dL (ref 3.6–5.1)
ALT: 57 U/L — ABNORMAL HIGH (ref 6–29)
AST: 43 U/L — ABNORMAL HIGH (ref 10–35)
Alkaline Phosphatase: 194 U/L — ABNORMAL HIGH (ref 33–130)
BILIRUBIN INDIRECT: 0.3 mg/dL (ref 0.2–1.2)
Bilirubin, Direct: 0.1 mg/dL (ref <=0.2)
TOTAL PROTEIN: 7.2 g/dL (ref 6.1–8.1)
Total Bilirubin: 0.4 mg/dL (ref 0.2–1.2)

## 2015-12-27 NOTE — Progress Notes (Signed)
Subjective:    Patient ID: Courtney Stevens, female    DOB: 1956-08-28, 60 y.o.   MRN: [id number]  HPI This very nice 60 yo MWF with Recurrent Kl[place]kin biliary cancer was treated 2 weeks ago for an upper/lower respiratory infection with Levaquin and Prednisone pulse/taper and called yesterday reporting blood sugar of 456 and noted that she was  Only on Glipizide 5 mg & Sitagliptin  as her Metformin has recently been withheld/d/c'd during a hospitalization for dehydration. She was advised to increase Glipizide 10 mg to 2-3x/day with meals and a new Rx Metformin was sent in, but not yet started. She had 2 subsequent Blood sugars yesterday reported hi- hi  by her monitor and today her FBG was down to 156. She also has c/o dysuria, but otherwise feels better with resolution of her recent respiratory sx's.   Medication Sig  . albuterol  HFA inhaler Inhale 2 puffs into the lungs every 4 (four) hours as needed   . ALPRAZolam  1 MG tablet TAKE 1/2 TO 1 TABLET 3 TIMES A DAY  . CITRACAL+D 315-200 Take 1 tablet by mouth 3 (three) times daily.  . cetirizine (ZYRTEC) 10 MG tablet Take 10 mg by mouth every morning.    Marland Kitchen DEXILANT 60 MG capsule Take 60 mg by mouth daily.   . ferrous sulfate 325 (65 FE) MG tablet Take 325 mg by mouth daily with breakfast.   Flonase Place 2 sprays into both nostrils daily as needed for allergies  . furosemide  40 MG tablet Take 1 to 2 tablets 2 x day for fluid & swelling   . glipiZIDE5 MG tablet Take 1/2 to 1 tab 3 x day if needed for elevated glucose  . KLOR-CON 20 MEQ TAKE 4 TABLETS BY MOUTH FOUR TIMES A DAY AS DIRECTED  . levothyroxine  200 MCG tablet Take 100-200 mcg by mouth daily before breakfast. 0.5 - 1 tablet daily alternate 100 mg with 200 mg  . CREON)12000 UNITS Take 1 capsule 12,000 Units total 3  times daily.  . Magnesium 500 MG TABS Take 1,000 mg by mouth daily  . MULTIVITAMIN WITH MINERALS Take 1 tablet by mouth daily.   . ONGLYZA 5 MG TABS tablet Take 5 mg by mouth  daily.  . sucralfate  1 g tablet TAKE 1 TABLET BY MOUTH 4 TIMES DAILY.  Marland Kitchen Vitamin D 50000 UNITS CAPS capsule Take 1 cap50,000 Units daily.   Allergies  Allergen Reactions  . Ace Inhibitors Anaphylaxis  . Tilade [Nedocromil] Anaphylaxis  . Reglan [Metoclopramide] Nausea And Vomiting  . Betadine [Povidone Iodine] Rash    iching    Past Medical History  Diagnosis Date  . GERD (gastroesophageal reflux disease)   . Diabetes mellitus   . Hypertension   . Hypothyroid   . Hyperlipidemia   . Asthma   . Hepatitis   . Anxiety   . Anemia     History of blood transfusion which is when pt reports being exposed to hepatitis in 2009.  . CKD (chronic kidney disease), stage II   . Cancer ([place])     Klatkin bile duct ca (2009)    Past Surgical History  Procedure Laterality Date  . Gastric bypass  2004  . Cholecystectomy  1992  . Bile duct resection  2009  . Appendectomy    . Tonsillectomy    . Abdominal hysterectomy  1990    BIL OOPHORECTOMY  . Tubal ligation    . Breast surgery  BIOPSY NEG  . Bunionectomy Right 2005  . Video bronchoscopy with endobronchial ultrasound N/A 10/12/2014    Procedure: VIDEO BRONCHOSCOPY WITH ENDOBRONCHIAL ULTRASOUND;  Surgeon: Brand Males, MD;  Location: [facility] OR;  Service: Thoracic;  Laterality: N/A;   Review of Systems 10 point systems review negative except as above.    Objective:   Physical Exam  BP 116/80 mmHg  Pulse 68  Temp(Src) 97.8 F (36.6 C)  Resp 16  Ht 5\' 7"  (1.702 m)  Wt 155 lb 12.8 oz (70.67 kg)  BMI 24.40 kg/m2  HEENT - Eac's patent. TM's Nl. EOM's full. PERRLA. NasoOroPharynx clear. Neck - supple. Nl Thyroid. Carotids 2+ & No bruits, nodes, JVD Chest - Clear equal BS w/o Rales, rhonchi, wheezes. Cor - Nl HS. RRR w/o sig MGR. PP 1(+). No edema. MS- FROM w/o deformities. Muscle power, tone and bulk Nl. Gait Nl. Neuro - No obvious Cr N abnormalities. Sensory, motor and Cerebellar functions appear Nl w/o focal  abnormalities. Psyche - Mental status normal & appropriate.  No delusions, ideations or obvious mood abnormalities.    Assessment & Plan:   1. Poorly controlled type 2 diabetes mellitus ([place])  - Advised to start Metformin pending Labs.  2. Urinary tract infection, site not specified  - Urinalysis, Routine w reflex microscopic  - Urine culture  3. Medication management  - CBC with Differential/Platelet - BASIC METABOLIC PANEL WITH GFR - Hepatic function panel

## 2015-12-28 LAB — URINALYSIS, ROUTINE W REFLEX MICROSCOPIC
Bilirubin Urine: NEGATIVE
HGB URINE DIPSTICK: NEGATIVE
Ketones, ur: NEGATIVE
LEUKOCYTES UA: NEGATIVE
NITRITE: NEGATIVE
PROTEIN: NEGATIVE
Specific Gravity, Urine: 1.011 (ref 1.001–1.035)
pH: 6.5 (ref 5.0–8.0)

## 2015-12-30 ENCOUNTER — Other Ambulatory Visit: Payer: Self-pay | Admitting: Internal Medicine

## 2015-12-30 DIAGNOSIS — N390 Urinary tract infection, site not specified: Secondary | ICD-10-CM

## 2015-12-30 LAB — URINE CULTURE

## 2015-12-30 MED ORDER — CEPHALEXIN 500 MG PO CAPS: ORAL_CAPSULE | ORAL | Status: AC

## 2015-12-31 ENCOUNTER — Other Ambulatory Visit: Payer: Self-pay | Admitting: Internal Medicine

## 2015-12-31 ENCOUNTER — Other Ambulatory Visit: Payer: Self-pay | Admitting: Physician Assistant

## 2016-01-01 ENCOUNTER — Other Ambulatory Visit: Payer: Self-pay | Admitting: *Deleted

## 2016-01-01 ENCOUNTER — Other Ambulatory Visit: Payer: Self-pay | Admitting: Internal Medicine

## 2016-01-01 ENCOUNTER — Encounter: Payer: Self-pay | Admitting: Internal Medicine

## 2016-01-01 DIAGNOSIS — B373 Candidiasis of vulva and vagina: Secondary | ICD-10-CM

## 2016-01-01 DIAGNOSIS — B3731 Acute candidiasis of vulva and vagina: Secondary | ICD-10-CM

## 2016-01-01 MED ORDER — CLOTRIMAZOLE-BETAMETHASONE 1-0.05 % EX CREA: TOPICAL_CREAM | CUTANEOUS | Status: DC

## 2016-01-01 MED ORDER — GLUCOSE BLOOD VI STRP: ORAL_STRIP | Status: DC

## 2016-01-08 ENCOUNTER — Other Ambulatory Visit: Payer: Self-pay | Admitting: *Deleted

## 2016-01-08 ENCOUNTER — Other Ambulatory Visit: Payer: Self-pay | Admitting: Internal Medicine

## 2016-01-08 MED ORDER — GLUCOSE BLOOD VI STRP: ORAL_STRIP | Status: DC

## 2016-01-08 MED ORDER — ONETOUCH ULTRA SYSTEM W/DEVICE KIT: 1.0000 | PACK | Freq: Once | Status: AC

## 2016-01-08 MED ORDER — ONETOUCH ULTRASOFT LANCETS MISC: Status: AC

## 2016-01-09 ENCOUNTER — Encounter: Payer: Self-pay | Admitting: Internal Medicine

## 2016-01-16 ENCOUNTER — Other Ambulatory Visit: Payer: Self-pay | Admitting: Internal Medicine

## 2016-01-25 ENCOUNTER — Encounter: Payer: Self-pay | Admitting: Internal Medicine

## 2016-01-25 ENCOUNTER — Ambulatory Visit (INDEPENDENT_AMBULATORY_CARE_PROVIDER_SITE_OTHER): Payer: 59 | Admitting: Internal Medicine

## 2016-01-25 VITALS — BP 122/86 | HR 72 | Temp 97.6°F | Resp 16 | Ht 67.0 in | Wt 159.4 lb

## 2016-01-25 DIAGNOSIS — K219 Gastro-esophageal reflux disease without esophagitis: Secondary | ICD-10-CM

## 2016-01-25 DIAGNOSIS — I10 Essential (primary) hypertension: Secondary | ICD-10-CM

## 2016-01-25 DIAGNOSIS — Z0001 Encounter for general adult medical examination with abnormal findings: Secondary | ICD-10-CM

## 2016-01-25 DIAGNOSIS — R5383 Other fatigue: Secondary | ICD-10-CM

## 2016-01-25 DIAGNOSIS — Z79899 Other long term (current) drug therapy: Secondary | ICD-10-CM

## 2016-01-25 DIAGNOSIS — E782 Mixed hyperlipidemia: Secondary | ICD-10-CM

## 2016-01-25 DIAGNOSIS — Z0000 Encounter for general adult medical examination without abnormal findings: Secondary | ICD-10-CM

## 2016-01-25 DIAGNOSIS — N182 Chronic kidney disease, stage 2 (mild): Secondary | ICD-10-CM

## 2016-01-25 DIAGNOSIS — C240 Malignant neoplasm of extrahepatic bile duct: Secondary | ICD-10-CM

## 2016-01-25 DIAGNOSIS — E559 Vitamin D deficiency, unspecified: Secondary | ICD-10-CM

## 2016-01-25 DIAGNOSIS — E1122 Type 2 diabetes mellitus with diabetic chronic kidney disease: Secondary | ICD-10-CM

## 2016-01-25 LAB — TSH: TSH: 1.97 m[IU]/L

## 2016-01-25 LAB — HEPATIC FUNCTION PANEL
ALT: 22 U/L (ref 6–29)
AST: 31 U/L (ref 10–35)
Albumin: 3.8 g/dL (ref 3.6–5.1)
Alkaline Phosphatase: 163 U/L — ABNORMAL HIGH (ref 33–130)
BILIRUBIN DIRECT: 0.1 mg/dL (ref <=0.2)
BILIRUBIN TOTAL: 0.3 mg/dL (ref 0.2–1.2)
Indirect Bilirubin: 0.2 mg/dL (ref 0.2–1.2)
Total Protein: 6.8 g/dL (ref 6.1–8.1)

## 2016-01-25 LAB — CBC WITH DIFFERENTIAL/PLATELET
BASOS PCT: 0 % (ref 0–1)
Basophils Absolute: 0 10*3/uL (ref 0.0–0.1)
Eosinophils Absolute: 0.1 10*3/uL (ref 0.0–0.7)
Eosinophils Relative: 2 % (ref 0–5)
HCT: 41.2 % (ref 36.0–46.0)
HEMOGLOBIN: 13.7 g/dL (ref 12.0–15.0)
Lymphocytes Relative: 24 % (ref 12–46)
Lymphs Abs: 1.8 10*3/uL (ref 0.7–4.0)
MCH: 29.1 pg (ref 26.0–34.0)
MCHC: 33.3 g/dL (ref 30.0–36.0)
MCV: 87.7 fL (ref 78.0–100.0)
MONOS PCT: 7 % (ref 3–12)
MPV: 9.5 fL (ref 8.6–12.4)
Monocytes Absolute: 0.5 10*3/uL (ref 0.1–1.0)
NEUTROS ABS: 5 10*3/uL (ref 1.7–7.7)
NEUTROS PCT: 67 % (ref 43–77)
Platelets: 218 10*3/uL (ref 150–400)
RBC: 4.7 MIL/uL (ref 3.87–5.11)
RDW: 12.5 % (ref 11.5–15.5)
WBC: 7.4 10*3/uL (ref 4.0–10.5)

## 2016-01-25 LAB — LIPID PANEL
CHOL/HDL RATIO: 2.8 ratio (ref <=5.0)
CHOLESTEROL: 133 mg/dL (ref 125–200)
HDL: 48 mg/dL (ref >=46)
LDL Cholesterol: 56 mg/dL (ref <130)
TRIGLYCERIDES: 143 mg/dL (ref <150)
VLDL: 29 mg/dL (ref <30)

## 2016-01-25 LAB — BASIC METABOLIC PANEL WITH GFR
BUN: 20 mg/dL (ref 7–25)
CALCIUM: 9.5 mg/dL (ref 8.6–10.4)
CHLORIDE: 99 mmol/L (ref 98–110)
CO2: 29 mmol/L (ref 20–31)
CREATININE: 1.22 mg/dL — AB (ref 0.50–0.99)
GFR, Est African American: 55 mL/min — ABNORMAL LOW (ref >=60)
GFR, Est Non African American: 48 mL/min — ABNORMAL LOW (ref >=60)
Glucose, Bld: 178 mg/dL — ABNORMAL HIGH (ref 65–99)
Potassium: 3.9 mmol/L (ref 3.5–5.3)
SODIUM: 139 mmol/L (ref 135–146)

## 2016-01-25 LAB — IRON AND TIBC
%SAT: 13 % (ref 11–50)
Iron: 48 ug/dL (ref 45–160)
TIBC: 356 ug/dL (ref 250–450)
UIBC: 308 ug/dL (ref 125–400)

## 2016-01-25 LAB — MAGNESIUM: MAGNESIUM: 2.1 mg/dL (ref 1.5–2.5)

## 2016-01-25 LAB — VITAMIN B12: VITAMIN B 12: 490 pg/mL (ref 200–1100)

## 2016-01-25 MED ORDER — FUROSEMIDE 40 MG PO TABS: ORAL_TABLET | ORAL | Status: DC

## 2016-01-25 NOTE — Progress Notes (Signed)
Patient ID: ARZELLA SALGUEIRO, female   DOB: 11/05/1956, 60 y.o.   MRN: [id number]  Annual Screening/Preventative Visit And Comprehensive Evaluation &  Examination     This very nice 60 y.o. MWF presents for a Wellness/Preventative Visit & comprehensive evaluation and management of multiple medical co-morbidities.  Patient has been followed for HTN, T2_NIDDM, Hyperlipidemia and Vitamin D Deficiency.      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 Patient has recurrent disease in her liver & possibly in he peritoneal space and is deferring initiation of ChemoTx pending  f/u CTscans to determine stability vs progression. Patient has has a complicated hx/o FUO in [id number] followed by ID here in [place] at [facility]. Patient's attitude has been remarkably positive despite repeated set-backs.       HTN predates since 1989. Patient's BP has been controlled at home and patient denies any cardiac symptoms as chest pain, palpitations, shortness of breath, dizziness or ankle swelling. Today's BP: 122/86 mmHg      Patient's hyperlipidemia is controlled with diet and medications. Patient denies myalgias or other medication SE's. Last lipids were  Cholesterol 103*; HDL 38*; LDL 49; Triglycerides 81 on 07/25/2015.     Patient has T2_NIDDM predating circa 2000 and patient denies reactive hypoglycemic symptoms, visual blurring, diabetic polys, or paresthesias. Last A1c was  7.7% on 07/25/2015.     Finally, patient has history of Vitamin D Deficiency of "28" in 2008 and last Vitamin D was 91 on 07/25/2015.    Medication Sig  . albuterol  HFA inhaler Inhale 2 puffs into the lungs every 4 (four) hours as needed for wheezing or shortness of breath.  . ALPRAZolam (XANAX) 1 MG tablet TAKE 1/2 TO 1 TABLET 3 TIMES A DAY   . CITRACAL+D  Take 1 tablet by mouth 3 (three) times daily.  . cetirizine ( 10  MG tablet Take 10 mg by mouth every morning.    Marland Kitchen DEXILANT 60 MG capsule Take 60 mg by mouth daily.   . ferrous sulfate 325  MG tablet Take 325 mg by mouth daily with breakfast.  . FLONASE nasal spray Place 2 sprays into both nostrils daily.   . furosemide  40 MG tablet Take 1 to 2 tablets 2 x day for fluid & swelling   . glipiZIDE  5 MG tablet Take 1/2 to 1 tab 3 x day if needed for elevated glucose  . KLOR-CON M20 20 MEQ  TAKE 4 TABLETS BY MOUTH FOUR TIMES A DAY AS DIRECTED  . levothyroxine  200 MCG  TAKE 1/2 TO - 1 TABLETS BY MOUTH DAILY BEFORE BREAKFAST. ALTERNATE TAKING 100 AND 200MG   . CREON 12000 UNITS  Take 1 capsule (12,000 Units total) by mouth 3 (three) times daily.  . Magnesium 500 MG TABS Take 1 tablet 2 to 4 x day as directed (Patient taking differently: Take 1,000 mg by mouth daily. )  . metFORMIN  500 MG tablet TAKE 1-2 TABLETS BY MOUTH 3 TIMES DAILY. TAKE 1 TAB AT BREAKFAST, 1 AT LUNCH AND 2 AT [facility]  . Multiple Vitamins-Minerals  Take 1 tablet by mouth daily.   . ONGLYZA 5 MG TABS tablet Take 5 mg by mouth daily.  . sucralfate  1 g tablet TAKE 1 TABLET BY MOUTH 4 TIMES DAILY.  Marland Kitchen Vitamin D 50,000 units CAPS capsule TAKE 1 CAPSULE (50,000 UNITS  TOTAL) BY MOUTH DAILY.   Allergies  Allergen Reactions  . Ace Inhibitors Anaphylaxis  . Tilade [Nedocromil] Anaphylaxis  . Reglan [Metoclopramide] Nausea And Vomiting  . Betadine [Povidone Iodine] Rash    iching    Past Medical History  Diagnosis Date  . GERD (gastroesophageal reflux disease)   . Diabetes mellitus   . Hypertension   . Hypothyroid   . Hyperlipidemia   . Asthma   . Hepatitis   . Anxiety   . Anemia     History of blood transfusion which is when pt reports being exposed to hepatitis in 2009.  . CKD (chronic kidney disease), stage II   . Cancer ([place])     Klatkin bile duct ca (2009)    Health Maintenance  Topic Date Due  . OPHTHALMOLOGY EXAM  01/06/1965  . PAP SMEAR  01/07/1976  . FOOT EXAM  12/28/2015   . URINE MICROALBUMIN  12/28/2015  . HEMOGLOBIN A1C  01/22/2016  . INFLUENZA VACCINE  06/18/2016  . MAMMOGRAM  06/06/2017  . COLONOSCOPY  07/12/2018  . TETANUS/TDAP  03/24/2024  . PNEUMOCOCCAL POLYSACCHARIDE VACCINE  Completed  . ZOSTAVAX  Completed  . Hepatitis C Screening  Completed  . HIV Screening  Completed   Immunization History  Administered Date(s) Administered  . Influenza Split 09/27/2014, 09/08/2015  . Influenza Whole 08/18/2012  . Influenza-Unspecified 08/31/2013  . Pneumococcal Conjugate-13 12/27/2014  . Pneumococcal Polysaccharide-23 11/18/2008  . Pneumococcal-Unspecified 06/14/1999  . Tdap 03/24/2014  . Zoster 03/11/2012   Past Surgical History  Procedure Laterality Date  . Gastric bypass  2004  . Cholecystectomy  1992  . Bile duct resection  2009  . Appendectomy    . Tonsillectomy    . Abdominal hysterectomy  1990    BIL OOPHORECTOMY  . Tubal ligation    . Breast surgery      BIOPSY NEG  . Bunionectomy Right 2005  . Video bronchoscopy with endobronchial ultrasound N/A 10/12/2014    Procedure: VIDEO BRONCHOSCOPY WITH ENDOBRONCHIAL ULTRASOUND;  Surgeon: Brand Males, MD;  Location: [facility] OR;  Service: Thoracic;  Laterality: N/A;   Family History  Problem Relation Age of Onset  . Colon cancer Mother   . Hypertension Mother   . Glaucoma Mother   . Diabetes Father   . Alzheimer's disease Father   . Heart attack Father   . Nephrolithiasis Father   . Emphysema Father     was a smoker  . Heart disease Father   . Nephrolithiasis Sister   . Heart attack Brother   . Nephrolithiasis Brother    Social History  Substance Use Topics  . Smoking status: Former Smoker -- 2.00 packs/day for 5 years    Types: Cigarettes    Quit date: 11/18/1977  . Smokeless tobacco: Never Used  . Alcohol Use: Yes     Comment: 2-3 glasses of wine once per month     ROS Constitutional: Denies fever, chills, weight loss/gain, headaches, insomnia,  night sweats, and change in  appetite. Does c/o fatigue. Eyes: Denies redness, blurred vision, diplopia, discharge, itchy, watery eyes.  ENT: Denies discharge, congestion, post nasal drip, epistaxis, sore throat, earache, hearing loss, dental pain, Tinnitus, Vertigo, Sinus pain, snoring.  Cardio: Denies chest pain, palpitations, irregular heartbeat, syncope, dyspnea, diaphoresis, orthopnea, PND, claudication, edema Respiratory: denies cough, dyspnea, DOE, pleurisy, hoarseness, laryngitis, wheezing.  Gastrointestinal: Denies dysphagia, heartburn, reflux, water brash, pain, cramps, nausea, vomiting, bloating, diarrhea, constipation, hematemesis, melena, hematochezia, jaundice, hemorrhoids Genitourinary: Denies dysuria, frequency, urgency, nocturia,  hesitancy, discharge, hematuria, flank pain Breast: Breast lumps, nipple discharge, bleeding.  Musculoskeletal: Denies arthralgia, myalgia, stiffness, Jt. Swelling, pain, limp, and strain/sprain. Denies falls. Skin: Denies puritis, rash, hives, warts, acne, eczema, changing in skin lesion Neuro: No weakness, tremor, incoordination, spasms, paresthesia, pain Psychiatric: Denies confusion, memory loss, sensory loss. Denies Depression. Endocrine: Denies change in weight, skin, hair change, nocturia, and paresthesia, diabetic polys, visual blurring, hyper / hypo glycemic episodes.  Heme/Lymph: No excessive bleeding, bruising, enlarged lymph nodes.  Physical Exam  BP 122/86 mmHg  Pulse 72  Temp(Src) 97.6 F (36.4 C)  Resp 16  Ht 5\' 7"  (1.702 m)  Wt 159 lb 6.4 oz (72.303 kg)  BMI 24.96 kg/m2  General Appearance: Well nourished and in no apparent distress. Eyes: PERRLA, EOMs, conjunctiva no swelling or erythema, normal fundi and vessels. Sinuses: No frontal/maxillary tenderness ENT/Mouth: EACs patent / TMs  nl. Nares clear without erythema, swelling, mucoid exudates. Oral hygiene is good. No erythema, swelling, or exudate. Tongue normal, non-obstructing. Tonsils not swollen or  erythematous. Hearing normal.  Neck: Supple, thyroid normal. No bruits, nodes or JVD. Respiratory: Respiratory effort normal.  BS equal and clear bilateral without rales, rhonci, wheezing or stridor. Cardio: Heart sounds are normal with regular rate and rhythm and no murmurs, rubs or gallops. Peripheral pulses are normal and equal bilaterally without edema. No aortic or femoral bruits. Chest: symmetric with normal excursions and percussion. Breasts: Symmetric, without lumps, nipple discharge, retractions, or fibrocystic changes.  Abdomen: Flat, soft, with bowl sounds. Nontender, no guarding, rebound, hernias, masses, or organomegaly.  Lymphatics: Non tender without lymphadenopathy.  Genitourinary:  Musculoskeletal: Full ROM all peripheral extremities, joint stability, 5/5 strength, and normal gait. Skin: Warm and dry without rashes, lesions, cyanosis, clubbing or  ecchymosis.  Neuro: Cranial nerves intact, reflexes equal bilaterally. Normal muscle tone, no cerebellar symptoms. Sensation intact.  Pysch: Alert and oriented X 3, normal affect, Insight and Judgment appropriate.   Assessment and Plan  1. Annual Preventative Screening Examination  - Microalbumin / creatinine urine ratio - EKG 12-Lead - [place], RETROPERITNL ABD,  LTD - POC Hemoccult Bld/Stl  - Urinalysis, Routine w reflex microscopic  - Vitamin B12 - Iron and TIBC - CBC with Differential/Platelet - BASIC METABOLIC PANEL WITH GFR - Hepatic function panel - Magnesium - Lipid panel - TSH - Hemoglobin A1c - Insulin, random - VITAMIN D 25 Hydroxy   2. Essential hypertension  - Microalbumin / creatinine urine ratio - EKG 12-Lead - [place], RETROPERITNL ABD,  LTD - TSH  3. Hyperlipidemia  - Lipid panel - TSH  4. Type 2 diabetes mellitus with stage 2 chronic kidney disease, without long-term current use of insulin (HCC)  - Hemoglobin A1c - Insulin, random  5. Vitamin D deficiency  - VITAMIN D 25 Hydroxy   6.  Gastroesophageal reflux disease   7. 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 Other fatigue  - Vitamin B12 - Iron and TIBC - CBC with Differential/Platelet  9. Medication management  - Urinalysis, Routine w reflex microscopic  - CBC with Differential/Platelet - BASIC METABOLIC PANEL WITH GFR - Hepatic function panel - Magnesium - furosemide (LASIX) 40 MG tablet; Take 1 to 2 tablets 2 x day for fluid & swelling  Dispense: 180 tablet; Refill: 1   Continue prudent diet as discussed, weight control, BP monitoring, regular exercise, and medications. Discussed med's effects and SE's. Screening labs and tests as requested with regular follow-up as recommended.  Over 40 minutes of exam, counseling, chart review and high complex critical decision making was performed.

## 2016-01-25 NOTE — Patient Instructions (Signed)
Recommend Adult Low Dose Aspirin or   coated  Aspirin 81 mg daily   To reduce risk of Colon Cancer 20 %,   Skin Cancer 26 % ,   Melanoma 46%   and   Pancreatic cancer 60%   ++++++++++++++++++++++++++++++++++++++++++++++++++++++ Vitamin D goal   is between 70-100.   Please make sure that you are taking your Vitamin D as directed.   It is very important as a natural anti-inflammatory   helping hair, skin, and nails, as well as reducing stroke and heart attack risk.   It helps your bones and helps with mood.  It also decreases numerous cancer risks so please take it as directed.   Low Vit D is associated with a 200-300% higher risk for CANCER   and 200-300% higher risk for HEART   ATTACK  &  STROKE.   .....................................Marland Kitchen  It is also associated with higher death rate at younger ages,   autoimmune diseases like Rheumatoid arthritis, Lupus, Multiple Sclerosis.     Also many other serious conditions, like depression, Alzheimer's  Dementia, infertility, muscle aches, fatigue, fibromyalgia - just to name a few.  ++++++++++++++++++++++++++++++++++++++++++++++++  Recommend the book "The END of DIETING" by Dr Excell Seltzer   & the book "The END of DIABETES " by Dr Excell Seltzer  At [email or web address] - get book & Audio CD's     Being diabetic has a  300% increased risk for heart attack, stroke, cancer, and alzheimer- type vascular dementia. It is very important that you work harder with diet by avoiding all foods that are white. Avoid white rice (brown & wild rice is OK), white potatoes (sweetpotatoes in moderation is OK), White bread or wheat bread or anything made out of white flour like bagels, donuts, rolls, buns, biscuits, cakes, pastries, cookies, pizza crust, and pasta (made from white flour & egg whites) - vegetarian pasta or spinach or wheat pasta is OK. Multigrain breads like Arnold's or Pepper[employer], or multigrain sandwich thins or flatbreads.  Diet,  exercise and weight loss can reverse and cure diabetes in the early stages.  Diet, exercise and weight loss is very important in the control and prevention of complications of diabetes which affects every system in your body, ie. Brain - dementia/stroke, eyes - glaucoma/blindness, heart - heart attack/heart failure, kidneys - dialysis, stomach - gastric paralysis, intestines - malabsorption, nerves - severe painful neuritis, circulation - gangrene & loss of a leg(s), and finally cancer and Alzheimers.    I recommend avoid fried & greasy foods,  sweets/candy, white rice (brown or wild rice or Quinoa is OK), white potatoes (sweet potatoes are OK) - anything made from white flour - bagels, doughnuts, rolls, buns, biscuits,white and wheat breads, pizza crust and traditional pasta made of white flour & egg white(vegetarian pasta or spinach or wheat pasta is OK).  Multi-grain bread is OK - like multi-grain flat bread or sandwich thins. Avoid alcohol in excess. Exercise is also important.    Eat all the vegetables you want - avoid meat, especially red meat and dairy - especially cheese.  Cheese is the most concentrated form of trans-fats which is the worst thing to clog up our arteries. Veggie cheese is OK which can be found in the fresh produce section at [employer] or [employer] or [employer]  ++++++++++++++++++++++++++++++++++++++++++++++++++ DASH Eating Plan  DASH stands for "Dietary Approaches to Stop Hypertension."   The DASH eating plan is a healthy eating plan that has been shown to reduce high blood  pressure (hypertension). Additional health benefits may include reducing the risk of type 2 diabetes mellitus, heart disease, and stroke. The DASH eating plan may also help with weight loss.  WHAT DO I NEED TO KNOW ABOUT THE DASH EATING PLAN?  For the DASH eating plan, you will follow these general guidelines:  Choose foods with a percent daily value for sodium of less than 5% (as listed on the food  label).  Use salt-free seasonings or herbs instead of table salt or sea salt.  Check with your health care provider or pharmacist before using salt substitutes.  Eat lower-sodium products, often labeled as "lower sodium" or "no salt added."  Eat fresh foods.  Eat more vegetables, fruits, and low-fat dairy products.    Choose whole grains. Look for the word "whole" as the first word in the ingredient list.  Choose fish   Limit sweets, desserts, sugars, and sugary drinks.  Choose heart-healthy fats.  Eat veggie cheese   Eat more home-cooked food and less restaurant, buffet, and fast food.  Limit fried foods.  [place]uffaker foods using methods other than frying.  Limit canned vegetables. If you do use them, rinse them well to decrease the sodium.  When eating at a restaurant, ask that your food be prepared with less salt, or no salt if possible.                      WHAT FOODS CAN I EAT?  Read Dr Fara Olden Fuhrman's books on The End of Dieting & The End of Diabetes  Grains  Whole grain or whole wheat bread. Brown rice. Whole grain or whole wheat pasta. Quinoa, bulgur, and whole grain cereals. Low-sodium cereals. Corn or whole wheat flour tortillas. Whole grain cornbread. Whole grain crackers. Low-sodium crackers.  Vegetables  Fresh or frozen vegetables (raw, steamed, roasted, or grilled). Low-sodium or reduced-sodium tomato and vegetable juices. Low-sodium or reduced-sodium tomato sauce and paste. Low-sodium or reduced-sodium canned vegetables.   Fruits  All fresh, canned (in natural juice), or frozen fruits.  Protein Products   All fish and seafood.  Dried beans, peas, or lentils. Unsalted nuts and seeds. Unsalted canned beans.  Dairy  Low-fat dairy products, such as skim or 1% milk, 2% or reduced-fat cheeses, low-fat ricotta or cottage cheese, or plain low-fat yogurt. Low-sodium or reduced-sodium cheeses.  Fats and Oils  Tub margarines without trans fats. Light or  reduced-fat mayonnaise and salad dressings (reduced sodium). Avocado. Safflower, olive, or canola oils. Natural peanut or almond butter.  Other  Unsalted popcorn and pretzels. The items listed above may not be a complete list of recommended foods or beverages. Contact your dietitian for more options.  +++++++++++++++++++++++++++++++++++++++++++  WHAT FOODS ARE NOT RECOMMENDED?  Grains/ White flour or wheat flour  White bread. White pasta. White rice. Refined cornbread. Bagels and croissants. Crackers that contain trans fat.  Vegetables  Creamed or fried vegetables. Vegetables in a . Regular canned vegetables. Regular canned tomato sauce and paste. Regular tomato and vegetable juices.  Fruits  Dried fruits. Canned fruit in light or heavy syrup. Fruit juice.  Meat and Other Protein Products  Meat in general - RED mwaet & White meat.  Fatty cuts of meat. Ribs, chicken wings, bacon, sausage, bologna, salami, chitterlings, fatback, hot dogs, bratwurst, and packaged luncheon meats.  Dairy  Whole or 2% milk, cream, half-and-half, and cream cheese. Whole-fat or sweetened yogurt. Full-fat cheeses or blue cheese. Nondairy creamers and whipped toppings. Processed cheese, cheese spreads, or  cheese curds.  Condiments  Onion and garlic salt, seasoned salt, table salt, and sea salt. Canned and packaged gravies. Worcestershire sauce. Tartar sauce. Barbecue sauce. Teriyaki sauce. Soy sauce, including reduced sodium. Steak sauce. Fish sauce. Oyster sauce. Cocktail sauce. Horseradish. Ketchup and mustard. Meat flavorings and tenderizers. Bouillon cubes. Hot sauce. Tabasco sauce. Marinades. Taco seasonings. Relishes.  Fats and Oils Butter, stick margarine, lard, shortening and bacon fat. Coconut, palm kernel, or palm oils. Regular salad dressings.  Pickles and olives. Salted popcorn and pretzels.  The items listed above may not be a complete list of foods and beverages to avoid.   Preventive  Care for Adults  A healthy lifestyle and preventive care can promote health and wellness. Preventive health guidelines for women include the following key practices.  A routine yearly physical is a good way to check with your health care provider about your health and preventive screening. It is a chance to share any concerns and updates on your health and to receive a thorough exam.  Visit your dentist for a routine exam and preventive care every 6 months. Brush your teeth twice a day and floss once a day. Good oral hygiene prevents tooth decay and gum disease.  The frequency of eye exams is based on your age, health, family medical history, use of contact lenses, and other factors. Follow your health care provider's recommendations for frequency of eye exams.  Eat a healthy diet. Foods like vegetables, fruits, whole grains, low-fat dairy products, and lean protein foods contain the nutrients you need without too many calories. Decrease your intake of foods high in solid fats, added sugars, and salt. Eat the right amount of calories for you.Get information about a proper diet from your health care provider, if necessary.  Regular physical exercise is one of the most important things you can do for your health. Most adults should get at least 150 minutes of moderate-intensity exercise (any activity that increases your heart rate and causes you to sweat) each week. In addition, most adults need muscle-strengthening exercises on 2 or more days a week.  Maintain a healthy weight. The body mass index (BMI) is a screening tool to identify possible weight problems. It provides an estimate of body fat based on height and weight. Your health care provider can find your BMI and can help you achieve or maintain a healthy weight.For adults 20 years and older:  A BMI below 18.5 is considered underweight.  A BMI of 18.5 to 24.9 is normal.  A BMI of 25 to 29.9 is considered overweight.  A BMI of 30 and  above is considered obese.  Maintain normal blood lipids and cholesterol levels by exercising and minimizing your intake of saturated fat. Eat a balanced diet with plenty of fruit and vegetables. Blood tests for lipids and cholesterol should begin at age 21 and be repeated every 5 years. If your lipid or cholesterol levels are high, you are over 50, or you are at high risk for heart disease, you may need your cholesterol levels checked more frequently.Ongoing high lipid and cholesterol levels should be treated with medicines if diet and exercise are not working.  If you smoke, find out from your health care provider how to quit. If you do not use tobacco, do not start.  Lung cancer screening is recommended for adults aged 31-80 years who are at high risk for developing lung cancer because of a history of smoking. A yearly low-dose CT scan of the lungs is recommended  for people who have at least a 30-pack-year history of smoking and are a current smoker or have quit within the past 15 years. A pack year of smoking is smoking an average of 1 pack of cigarettes a day for 1 year (for example: 1 pack a day for 30 years or 2 packs a day for 15 years). Yearly screening should continue until the smoker has stopped smoking for at least 15 years. Yearly screening should be stopped for people who develop a health problem that would prevent them from having lung cancer treatment.  High blood pressure causes heart disease and increases the risk of stroke. Your blood pressure should be checked at least every 1 to 2 years. Ongoing high blood pressure should be treated with medicines if weight loss and exercise do not work.  If you are 30-80 years old, ask your health care provider if you should take aspirin to prevent strokes.  Diabetes screening involves taking a blood sample to check your fasting blood sugar level. This should be done once every 3 years, after age 61, if you are within normal weight and without risk  factors for diabetes. Testing should be considered at a younger age or be carried out more frequently if you are overweight and have at least 1 risk factor for diabetes.  Breast cancer screening is essential preventive care for women. You should practice "breast self-awareness." This means understanding the normal appearance and feel of your breasts and may include breast self-examination. Any changes detected, no matter how small, should be reported to a health care provider. Women in their 77s and 30s should have a clinical breast exam (CBE) by a health care provider as part of a regular health exam every 1 to 3 years. After age 81, women should have a CBE every year. Starting at age 20, women should consider having a mammogram (breast X-ray test) every year. Women who have a family history of breast cancer should talk to their health care provider about genetic screening. Women at a high risk of breast cancer should talk to their health care providers about having an MRI and a mammogram every year.  Breast cancer gene (BRCA)-related cancer risk assessment is recommended for women who have family members with BRCA-related cancers. BRCA-related cancers include breast, ovarian, tubal, and peritoneal cancers. Having family members with these cancers may be associated with an increased risk for harmful changes (mutations) in the breast cancer genes BRCA1 and BRCA2. Results of the assessment will determine the need for genetic counseling and BRCA1 and BRCA2 testing.  Routine pelvic exams to screen for cancer are no longer recommended for nonpregnant women who are considered low risk for cancer of the pelvic organs (ovaries, uterus, and vagina) and who do not have symptoms. Ask your health care provider if a screening pelvic exam is right for you.  If you have had past treatment for cervical cancer or a condition that could lead to cancer, you need Pap tests and screening for cancer for at least 20 years after  your treatment. If Pap tests have been discontinued, your risk factors (such as having a new sexual partner) need to be reassessed to determine if screening should be resumed. Some women have medical problems that increase the chance of getting cervical cancer. In these cases, your health care provider may recommend more frequent screening and Pap tests.  Colorectal cancer can be detected and often prevented. Most routine colorectal cancer screening begins at the age of 22 years and  continues through age 74 years. However, your health care provider may recommend screening at an earlier age if you have risk factors for colon cancer. On a yearly basis, your health care provider may provide home test kits to check for hidden blood in the stool. Use of a small camera at the end of a tube, to directly examine the colon (sigmoidoscopy or colonoscopy), can detect the earliest forms of colorectal cancer. Talk to your health care provider about this at age 22, when routine screening begins. Direct exam of the colon should be repeated every 5-10 years through age 76 years, unless early forms of pre-cancerous polyps or small growths are found.  Hepatitis C blood testing is recommended for all people born from [birth year] through 1966 and any individual with known risks for hepatitis C.  Pra  Osteoporosis is a disease in which the bones lose minerals and strength with aging. This can result in serious bone fractures or breaks. The risk of osteoporosis can be identified using a bone density scan. Women ages 88 years and over and women at risk for fractures or osteoporosis should discuss screening with their health care providers. Ask your health care provider whether you should take a calcium supplement or vitamin D to reduce the rate of osteoporosis.  Menopause can be associated with physical symptoms and risks. Hormone replacement therapy is available to decrease symptoms and risks. You should talk to your health care  provider about whether hormone replacement therapy is right for you.  Use sunscreen. Apply sunscreen liberally and repeatedly throughout the day. You should seek shade when your shadow is shorter than you. Protect yourself by wearing long sleeves, pants, a wide-brimmed hat, and sunglasses year round, whenever you are outdoors.  Once a month, do a whole body skin exam, using a mirror to look at the skin on your back. Tell your health care provider of new moles, moles that have irregular borders, moles that are larger than a pencil eraser, or moles that have changed in shape or color.  Stay current with required vaccines (immunizations).  Influenza vaccine. All adults should be immunized every year.  Tetanus, diphtheria, and acellular pertussis (Td, Tdap) vaccine. Pregnant women should receive 1 dose of Tdap vaccine during each pregnancy. The dose should be obtained regardless of the length of time since the last dose. Immunization is preferred during the 27th-36th week of gestation. An adult who has not previously received Tdap or who does not know her vaccine status should receive 1 dose of Tdap. This initial dose should be followed by tetanus and diphtheria toxoids (Td) booster doses every 10 years. Adults with an unknown or incomplete history of completing a 3-dose immunization series with Td-containing vaccines should begin or complete a primary immunization series including a Tdap dose. Adults should receive a Td booster every 10 years.  Varicella vaccine. An adult without evidence of immunity to varicella should receive 2 doses or a second dose if she has previously received 1 dose. Pregnant females who do not have evidence of immunity should receive the first dose after pregnancy. This first dose should be obtained before leaving the health care facility. The second dose should be obtained 4-8 weeks after the first dose.  Human papillomavirus (HPV) vaccine. Females aged 13-26 years who have not  received the vaccine previously should obtain the 3-dose series. The vaccine is not recommended for use in pregnant females. However, pregnancy testing is not needed before receiving a dose. If a female is found to  be pregnant after receiving a dose, no treatment is needed. In that case, the remaining doses should be delayed until after the pregnancy. Immunization is recommended for any person with an immunocompromised condition through the age of 14 years if she did not get any or all doses earlier. During the 3-dose series, the second dose should be obtained 4-8 weeks after the first dose. The third dose should be obtained 24 weeks after the first dose and 16 weeks after the second dose.  Zoster vaccine. One dose is recommended for adults aged 87 years or older unless certain conditions are present.  Measles, mumps, and rubella (MMR) vaccine. Adults born before [birth year] generally are considered immune to measles and mumps. Adults born in [birth year] or later should have 1 or more doses of MMR vaccine unless there is a contraindication to the vaccine or there is laboratory evidence of immunity to each of the three diseases. A routine second dose of MMR vaccine should be obtained at least 28 days after the first dose for students attending postsecondary schools, health care workers, or international travelers. People who received inactivated measles vaccine or an unknown type of measles vaccine during 1963-1967 should receive 2 doses of MMR vaccine. People who received inactivated mumps vaccine or an unknown type of mumps vaccine before 1979 and are at high risk for mumps infection should consider immunization with 2 doses of MMR vaccine. For females of childbearing age, rubella immunity should be determined. If there is no evidence of immunity, females who are not pregnant should be vaccinated. If there is no evidence of immunity, females who are pregnant should delay immunization until after pregnancy. Unvaccinated  health care workers born before [birth year] who lack laboratory evidence of measles, mumps, or rubella immunity or laboratory confirmation of disease should consider measles and mumps immunization with 2 doses of MMR vaccine or rubella immunization with 1 dose of MMR vaccine.  Pneumococcal 13-valent conjugate (PCV13) vaccine. When indicated, a person who is uncertain of her immunization history and has no record of immunization should receive the PCV13 vaccine. An adult aged 48 years or older who has certain medical conditions and has not been previously immunized should receive 1 dose of PCV13 vaccine. This PCV13 should be followed with a dose of pneumococcal polysaccharide (PPSV23) vaccine. The PPSV23 vaccine dose should be obtained at least 8 weeks after the dose of PCV13 vaccine. An adult aged 40 years or older who has certain medical conditions and previously received 1 or more doses of PPSV23 vaccine should receive 1 dose of PCV13. The PCV13 vaccine dose should be obtained 1 or more years after the last PPSV23 vaccine dose.    Pneumococcal polysaccharide (PPSV23) vaccine. When PCV13 is also indicated, PCV13 should be obtained first. All adults aged 66 years and older should be immunized. An adult younger than age 28 years who has certain medical conditions should be immunized. Any person who resides in a nursing home or long-term care facility should be immunized. An adult smoker should be immunized. People with an immunocompromised condition and certain other conditions should receive both PCV13 and PPSV23 vaccines. People with human immunodeficiency virus (HIV) infection should be immunized as soon as possible after diagnosis. Immunization during chemotherapy or radiation therapy should be avoided. Routine use of PPSV23 vaccine is not recommended for American Indians, [place] Natives, or people younger than 65 years unless there are medical conditions that require PPSV23 vaccine. When indicated, people who  have unknown immunization and have no record of  immunization should receive PPSV23 vaccine. One-time revaccination 5 years after the first dose of PPSV23 is recommended for people aged 19-64 years who have chronic kidney failure, nephrotic syndrome, asplenia, or immunocompromised conditions. People who received 1-2 doses of PPSV23 before age 43 years should receive another dose of PPSV23 vaccine at age 75 years or later if at least 5 years have passed since the previous dose. Doses of PPSV23 are not needed for people immunized with PPSV23 at or after age 75 years.  Preventive Services / Frequency   Ages 38 to 43 years  Blood pressure check.  Lipid and cholesterol check.  Lung cancer screening. / Every year if you are aged 57-80 years and have a 30-pack-year history of smoking and currently smoke or have quit within the past 15 years. Yearly screening is stopped once you have quit smoking for at least 15 years or develop a health problem that would prevent you from having lung cancer treatment.  Clinical breast exam.** / Every year after age 95 years.  BRCA-related cancer risk assessment.** / For women who have family members with a BRCA-related cancer (breast, ovarian, tubal, or peritoneal cancers).  Mammogram.** / Every year beginning at age 63 years and continuing for as long as you are in good health. Consult with your health care provider.  Pap test.** / Every 3 years starting at age 87 years through age 20 or 53 years with a history of 3 consecutive normal Pap tests.  HPV screening.** / Every 3 years from ages 84 years through ages 19 to 62 years with a history of 3 consecutive normal Pap tests.  Fecal occult blood test (FOBT) of stool. / Every year beginning at age 50 years and continuing until age 59 years. You may not need to do this test if you get a colonoscopy every 10 years.  Flexible sigmoidoscopy or colonoscopy.** / Every 5 years for a flexible sigmoidoscopy or every 10 years  for a colonoscopy beginning at age 73 years and continuing until age 77 years.  Hepatitis C blood test.** / For all people born from [birth year] through 1966 and any individual with known risks for hepatitis C.  Skin self-exam. / Monthly.  Influenza vaccine. / Every year.  Tetanus, diphtheria, and acellular pertussis (Tdap/Td) vaccine.** / Consult your health care provider. Pregnant women should receive 1 dose of Tdap vaccine during each pregnancy. 1 dose of Td every 10 years.  Varicella vaccine.** / Consult your health care provider. Pregnant females who do not have evidence of immunity should receive the first dose after pregnancy.  Zoster vaccine.** / 1 dose for adults aged 32 years or older.  Pneumococcal 13-valent conjugate (PCV13) vaccine.** / Consult your health care provider.  Pneumococcal polysaccharide (PPSV23) vaccine.** / 1 to 2 doses if you smoke cigarettes or if you have certain conditions.  Meningococcal vaccine.** / Consult your health care provider.  Hepatitis A vaccine.** / Consult your health care provider.  Hepatitis B vaccine.** / Consult your health care provider. Screening for abdominal aortic aneurysm (AAA)  by ultrasound is recommended for people over 50 who have history of high blood pressure or who are current or former smokers.

## 2016-01-26 ENCOUNTER — Encounter: Payer: Self-pay | Admitting: Internal Medicine

## 2016-01-26 LAB — URINALYSIS, ROUTINE W REFLEX MICROSCOPIC
BILIRUBIN URINE: NEGATIVE
Glucose, UA: NEGATIVE
HGB URINE DIPSTICK: NEGATIVE
KETONES UR: NEGATIVE
Leukocytes, UA: NEGATIVE
Nitrite: NEGATIVE
Protein, ur: NEGATIVE
SPECIFIC GRAVITY, URINE: 1.008 (ref 1.001–1.035)
pH: 6.5 (ref 5.0–8.0)

## 2016-01-26 LAB — MICROALBUMIN / CREATININE URINE RATIO: CREATININE, URINE: 17 mg/dL — AB (ref 20–320)

## 2016-01-26 LAB — HEMOGLOBIN A1C
Hgb A1c MFr Bld: 9.6 % — ABNORMAL HIGH (ref <5.7)
Mean Plasma Glucose: 229 mg/dL — ABNORMAL HIGH (ref <117)

## 2016-01-26 LAB — INSULIN, RANDOM: Insulin: 6.5 u[IU]/mL (ref 2.0–19.6)

## 2016-01-26 LAB — VITAMIN D 25 HYDROXY (VIT D DEFICIENCY, FRACTURES): VIT D 25 HYDROXY: 94 ng/mL (ref 30–100)

## 2016-02-22 ENCOUNTER — Encounter: Payer: Self-pay | Admitting: Internal Medicine

## 2016-02-22 ENCOUNTER — Ambulatory Visit (INDEPENDENT_AMBULATORY_CARE_PROVIDER_SITE_OTHER): Payer: 59 | Admitting: Internal Medicine

## 2016-02-22 VITALS — BP 138/72 | HR 78 | Temp 98.2°F | Resp 16 | Ht 67.0 in | Wt 165.0 lb

## 2016-02-22 DIAGNOSIS — L03114 Cellulitis of left upper limb: Secondary | ICD-10-CM | POA: Diagnosis not present

## 2016-02-22 MED ORDER — PREDNISONE 20 MG PO TABS: ORAL_TABLET | ORAL | Status: DC

## 2016-02-22 MED ORDER — DOXYCYCLINE HYCLATE 100 MG PO CAPS: 100.0000 mg | ORAL_CAPSULE | Freq: Two times a day (BID) | ORAL | Status: DC

## 2016-02-22 MED ORDER — TRAMADOL HCL 50 MG PO TABS: 100.0000 mg | ORAL_TABLET | Freq: Four times a day (QID) | ORAL | Status: DC | PRN

## 2016-02-22 NOTE — Progress Notes (Signed)
   Subjective:    Patient ID: Courtney Stevens, female    DOB: 1956-05-06, 60 y.o.   MRN: [id number]  Wrist Pain  Pertinent negatives include no fever or numbness.  Patient presents to the office for evaluation of left wrist redness and swelling x 1 week.  She reports that it is painful and has also been increasing in redness.  She reports that it is hot and red and she has gotten no relief with changes in position, warm compresses, ice, tylenol pm with no relief.  She is right handed.  She has had no injury that she can think.  This has happened in the past.      Review of Systems  Constitutional: Negative for fever, chills and fatigue.  Gastrointestinal: Negative for nausea and vomiting.  Musculoskeletal: Positive for joint swelling and arthralgias.  Skin: Positive for rash.  Neurological: Negative for dizziness, weakness and numbness.       Objective:   Physical Exam  Constitutional: She appears well-developed and well-nourished. No distress.  HENT:  Head: Normocephalic.  Mouth/Throat: Oropharynx is clear and moist. No oropharyngeal exudate.  Eyes: Conjunctivae are normal. No scleral icterus.  Neck: Normal range of motion. Neck supple. No JVD present. No thyromegaly present.  Cardiovascular: Normal rate, regular rhythm, normal heart sounds and intact distal pulses.  Exam reveals no gallop and no friction rub.   No murmur heard. Pulses:      Radial pulses are 2+ on the right side, and 2+ on the left side.  Pulmonary/Chest: Effort normal and breath sounds normal. No respiratory distress. She has no wheezes. She has no rales. She exhibits no tenderness.  Musculoskeletal:       Left wrist: She exhibits decreased range of motion and tenderness. She exhibits no bony tenderness, no swelling, no effusion, no crepitus, no deformity and no laceration.       Arms:      Left hand: She exhibits decreased range of motion. She exhibits no tenderness, no bony tenderness, normal two-point  discrimination, normal capillary refill, no deformity, no laceration and no swelling. Normal sensation noted. Normal strength noted.  Lymphadenopathy:    She has no cervical adenopathy.  Skin: She is not diaphoretic.  Nursing note and vitals reviewed.   Filed Vitals:   02/22/16 1530  BP: 138/72  Pulse: 78  Temp: 98.2 F (36.8 C)  Resp: 16         Assessment & Plan:    1. Cellulitis of left upper extremity -doxycycline -prednisone -tramadol prn for pain

## 2016-02-22 NOTE — Patient Instructions (Signed)

## 2016-03-08 ENCOUNTER — Telehealth: Payer: Self-pay | Admitting: *Deleted

## 2016-03-08 MED ORDER — PREDNISONE 20 MG PO TABS: ORAL_TABLET | ORAL | Status: DC

## 2016-03-08 NOTE — Telephone Encounter (Signed)
Patient called and complained of left shoulder pain all week.  She states she has had no injury.  Per Dr Melford Aase, send in an RX for Prednisone and if she is not better, she will need to see an orthopedist.  Patient advised.

## 2016-03-16 ENCOUNTER — Other Ambulatory Visit: Payer: Self-pay | Admitting: Emergency Medicine

## 2016-03-16 ENCOUNTER — Other Ambulatory Visit: Payer: Self-pay | Admitting: Internal Medicine

## 2016-03-16 DIAGNOSIS — M62838 Other muscle spasm: Secondary | ICD-10-CM

## 2016-03-17 ENCOUNTER — Other Ambulatory Visit: Payer: Self-pay | Admitting: Internal Medicine

## 2016-03-18 ENCOUNTER — Ambulatory Visit (INDEPENDENT_AMBULATORY_CARE_PROVIDER_SITE_OTHER): Payer: 59 | Admitting: Internal Medicine

## 2016-03-18 VITALS — BP 136/82 | HR 68 | Temp 98.0°F | Resp 16 | Ht 67.0 in | Wt 156.0 lb

## 2016-03-18 DIAGNOSIS — M7522 Bicipital tendinitis, left shoulder: Secondary | ICD-10-CM

## 2016-03-18 DIAGNOSIS — R079 Chest pain, unspecified: Secondary | ICD-10-CM

## 2016-03-18 DIAGNOSIS — R42 Dizziness and giddiness: Secondary | ICD-10-CM

## 2016-03-18 LAB — CBC WITH DIFFERENTIAL/PLATELET
BASOS ABS: 0 {cells}/uL (ref 0–200)
Basophils Relative: 0 %
EOS PCT: 1 %
Eosinophils Absolute: 92 cells/uL (ref 15–500)
HCT: 37.8 % (ref 35.0–45.0)
Hemoglobin: 12.5 g/dL (ref 11.7–15.5)
LYMPHS PCT: 25 %
Lymphs Abs: 2300 cells/uL (ref 850–3900)
MCH: 28.5 pg (ref 27.0–33.0)
MCHC: 33.1 g/dL (ref 32.0–36.0)
MCV: 86.1 fL (ref 80.0–100.0)
MONOS PCT: 7 %
MPV: 9.2 fL (ref 7.5–12.5)
Monocytes Absolute: 644 cells/uL (ref 200–950)
NEUTROS PCT: 67 %
Neutro Abs: 6164 cells/uL (ref 1500–7800)
PLATELETS: 276 10*3/uL (ref 140–400)
RBC: 4.39 MIL/uL (ref 3.80–5.10)
RDW: 13.6 % (ref 11.0–15.0)
WBC: 9.2 10*3/uL (ref 3.8–10.8)

## 2016-03-18 LAB — TROPONIN I

## 2016-03-18 LAB — BASIC METABOLIC PANEL WITH GFR
BUN: 23 mg/dL (ref 7–25)
CALCIUM: 9.3 mg/dL (ref 8.6–10.4)
CO2: 32 mmol/L — AB (ref 20–31)
CREATININE: 1.27 mg/dL — AB (ref 0.50–0.99)
Chloride: 96 mmol/L — ABNORMAL LOW (ref 98–110)
GFR, Est African American: 53 mL/min — ABNORMAL LOW (ref >=60)
GFR, Est Non African American: 46 mL/min — ABNORMAL LOW (ref >=60)
Glucose, Bld: 202 mg/dL — ABNORMAL HIGH (ref 65–99)
Potassium: 3.6 mmol/L (ref 3.5–5.3)
SODIUM: 136 mmol/L (ref 135–146)

## 2016-03-18 LAB — HEPATIC FUNCTION PANEL
ALT: 23 U/L (ref 6–29)
AST: 21 U/L (ref 10–35)
Albumin: 3.7 g/dL (ref 3.6–5.1)
Alkaline Phosphatase: 94 U/L (ref 33–130)
BILIRUBIN DIRECT: 0.1 mg/dL (ref <=0.2)
BILIRUBIN TOTAL: 0.3 mg/dL (ref 0.2–1.2)
Indirect Bilirubin: 0.2 mg/dL (ref 0.2–1.2)
Total Protein: 5.9 g/dL — ABNORMAL LOW (ref 6.1–8.1)

## 2016-03-18 MED ORDER — AZELASTINE HCL 0.1 % NA SOLN: 2.0000 | Freq: Two times a day (BID) | NASAL | Status: DC

## 2016-03-18 MED ORDER — DIAZEPAM 5 MG PO TABS: 5.0000 mg | ORAL_TABLET | Freq: Four times a day (QID) | ORAL | Status: DC | PRN

## 2016-03-18 NOTE — Progress Notes (Signed)
Subjective:    Patient ID: Courtney Stevens, female    DOB: 1955/12/19, 60 y.o.   MRN: [id number]  HPI  Patient presents to the office for evaluation of left shoulder pain.  She reports that two weeks ago she had left shoulder pain and was started on prednisone.  This past Friday she developed some chest pain, some bilateral arm numbness, and also some shortness of breath.  She reports that she laid down and she reports that she has just not felt good since that time.  She reports that the pain has improved, but she still feels like there is some mild discomfort.  She reports that when she bends over she is dizzy and light headed, but she has no change in her chest pain.  She has had some minor dizziness as well.  She reports that nothing has really helped with the chest pressure.  She has never had heart problems in the past.  She reports that both her father and brother have had heart attacks.  Dad was in his late seventies when he had a heart attack and brother was in his 45s.  She does have a history of HTN, HL, and diabetes.  Not a current smoker, but did smoke for a few years as a teenager.  She does have a vertigo sensation like the room is spinning when she bends over.    Review of Systems  Constitutional: Positive for fatigue. Negative for fever and chills.  Respiratory: Negative for cough, chest tightness, shortness of breath and wheezing.   Cardiovascular: Positive for chest pain. Negative for palpitations and leg swelling.  Gastrointestinal: Positive for nausea and abdominal pain. Negative for vomiting, diarrhea and constipation.  Genitourinary: Negative.   Musculoskeletal: Negative for back pain.  Neurological: Positive for dizziness, weakness and light-headedness. Negative for numbness.  Psychiatric/Behavioral: The patient is nervous/anxious.        Objective:   Physical Exam  Constitutional: She is oriented to person, place, and time. She appears well-developed and well-nourished.  No distress.  HENT:  Head: Normocephalic.  Mouth/Throat: Oropharynx is clear and moist. No oropharyngeal exudate.  Eyes: Conjunctivae are normal. No scleral icterus.  Neck: Normal range of motion. Neck supple. No JVD present. No thyromegaly present.  Cardiovascular: Normal rate, regular rhythm, normal heart sounds and intact distal pulses.  Exam reveals no gallop and no friction rub.   No murmur heard. Pulmonary/Chest: Effort normal and breath sounds normal. No respiratory distress. She has no wheezes. She has no rales. She exhibits tenderness.  Abdominal: Soft. Bowel sounds are normal. She exhibits no distension and no mass. There is no tenderness. There is no rebound and no guarding.  Musculoskeletal:       Left shoulder: She exhibits decreased range of motion, tenderness and spasm. She exhibits no bony tenderness, no swelling, no effusion, no crepitus, no deformity, no laceration, no pain, normal pulse and normal strength.  Palpable spasm in the left trapezius.  Negative biceps head tenderness.  Positive speeds test.  Negative empty can test.  Normal internal and external rotation.    Lymphadenopathy:    She has no cervical adenopathy.  Neurological: She is alert and oriented to person, place, and time. She has normal strength. No cranial nerve deficit or sensory deficit. Coordination normal.  Skin: Skin is warm and dry. She is not diaphoretic.  Psychiatric: Her behavior is normal. Judgment and thought content normal. She is not withdrawn. Cognition and memory are normal. She exhibits a depressed  mood. She expresses no homicidal and no suicidal ideation. She expresses no suicidal plans and no homicidal plans.  Nursing note and vitals reviewed.   Filed Vitals:   03/18/16 1107  BP: 136/82  Pulse: 68  Temp: 98 F (36.7 C)  Resp: 16          Assessment & Plan:    1. Chest pain, unspecified chest pain type  - CBC with Differential/Platelet - BASIC METABOLIC PANEL WITH GFR -  Hepatic function panel - EKG 12-Lead - Troponin I -referral to cards for possible stress testing if all lab work normal -EKG without change  2.  Biceps Tendonitis -referral to ortho  3.  Dizziness -valium -astelin -question of BPPV -normal neuro exam -no ataxia

## 2016-03-18 NOTE — Telephone Encounter (Signed)
Rx called into [facility] & instruction  Were given not to take valium w/xanax.

## 2016-03-23 ENCOUNTER — Other Ambulatory Visit: Payer: Self-pay | Admitting: Internal Medicine

## 2016-03-25 ENCOUNTER — Encounter: Payer: Self-pay | Admitting: Cardiology

## 2016-03-25 ENCOUNTER — Ambulatory Visit (INDEPENDENT_AMBULATORY_CARE_PROVIDER_SITE_OTHER): Payer: 59 | Admitting: Cardiology

## 2016-03-25 VITALS — BP 100/64 | HR 68 | Ht 67.0 in | Wt 159.2 lb

## 2016-03-25 DIAGNOSIS — I10 Essential (primary) hypertension: Secondary | ICD-10-CM

## 2016-03-25 DIAGNOSIS — R079 Chest pain, unspecified: Secondary | ICD-10-CM | POA: Diagnosis not present

## 2016-03-25 NOTE — Progress Notes (Signed)
Cardiology Office Note   Date:  03/25/2016   ID:  Arryanna, Holquin 1956/02/26, MRN [id number]  PCP:  [name], MD  Cardiologist:   Peter Martinique, MD   Chief Complaint  Patient presents with  . New Evaluation    no chest pain, no shortness of breath, occassional edema in ankles, has pain and cramping in legs in the middle of the night, none current- lightheaded or dizziness      History of Present Illness: DA MICHELLE is a 60 y.o. female who presents for evaluation of chest pain at the request of Starlyn Skeans PA-C. She has a history of HTN and diabetes. She states she had diabetes until 2005- she had gastric bypass and lost weight and sugars normalized. 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 She states her cholesterol is OK. She does have a family history of CAD. She reports that 3 weeks ago she had severe chest pain. She felt like an elephant was sitting on her chest. It radiated down her arms and her arms felt numb. She felt nauseous. She thought she had a heart attack or a panic attack. She was seen 2 days later by her primary care and Ecg and troponin were normal. She denies any recurrent chest pain. She was under stress at the time caring for a sick grandchild. She reports prior stress testing over 10 years ago. Echo in 2015 was normal.     Past Medical History  Diagnosis Date  . GERD (gastroesophageal reflux disease)   . Diabetes mellitus   . Hypertension   . Hypothyroid   . Hyperlipidemia   . Asthma   . Hepatitis   . Anxiety   . Anemia     History of blood transfusion which is when pt reports being exposed to hepatitis in 2009.  . CKD (chronic kidney disease), stage II   . Cancer ([place])     [place] bile duct ca (2009)     Past Surgical History  Procedure Laterality Date  . Gastric bypass  2004  . Cholecystectomy   1992  . Bile duct resection  2009  . Appendectomy    . Tonsillectomy    . Abdominal hysterectomy  1990    BIL OOPHORECTOMY  . Tubal ligation    . Breast surgery      BIOPSY NEG  . Bunionectomy Right 2005  . Video bronchoscopy with endobronchial ultrasound N/A 10/12/2014    Procedure: VIDEO BRONCHOSCOPY WITH ENDOBRONCHIAL ULTRASOUND;  Surgeon: Brand Males, MD;  Location: [facility] OR;  Service: Thoracic;  Laterality: N/A;     Current Outpatient Prescriptions  Medication Sig Dispense Refill  . albuterol (VENTOLIN HFA) 108 (90 BASE) MCG/ACT inhaler Inhale 2 puffs into the lungs every 4 (four) hours as needed for wheezing or shortness of breath. 1 Inhaler 4  . ALPRAZolam (XANAX) 1 MG tablet TAKE 1/2 TO 1 TABLET 3 TIMES A DAY (Patient taking differently: TAKE 1/2 TO 1 TABLET 3 TIMES A DAY AS NEEDED FOR ANXIETY) 270 tablet 1  . azelastine (ASTELIN) 0.1 % nasal spray Place 2 sprays into both nostrils 2 (two) times daily. Use in each nostril as directed 30 mL 2  . Blood Glucose Monitoring Suppl (ONE TOUCH ULTRA SYSTEM KIT) w/Device KIT 1 kit by Does not apply route once. 1 each 0  . calcium citrate-vitamin D (CITRACAL+D) 315-200 MG-UNIT  per tablet Take 1 tablet by mouth 3 (three) times daily.    . cetirizine (ZYRTEC) 10 MG tablet Take 10 mg by mouth every morning.      Marland Kitchen DEXILANT 60 MG capsule Take 60 mg by mouth daily.   3  . diazepam (VALIUM) 5 MG tablet Take 1 tablet (5 mg total) by mouth every 6 (six) hours as needed for muscle spasms (vertigo). 60 tablet 0  . ferrous sulfate 325 (65 FE) MG tablet Take 325 mg by mouth daily with breakfast.    . fluticasone (FLONASE) 50 MCG/ACT nasal spray Place 2 sprays into both nostrils daily. (Patient taking differently: Place 2 sprays into both nostrils daily as needed for allergies. ) 16 g 2  . furosemide (LASIX) 40 MG tablet Take 1 to 2 tablets 2 x day for fluid & swelling 180 tablet 1  . glipiZIDE (GLUCOTROL) 5 MG tablet Take 1/2 to 1 tab 3 x day if  needed for elevated glucose 270 tablet 1  . glucose blood (ONE TOUCH TEST STRIPS) test strip CHECK BLOOD SUGAR 3 TIMES DAILY DUE TO POORLY CONTROLLED GLUCOSE-DX-E11.65 100 each 12  . KLOR-CON M20 20 MEQ tablet TAKE 4 TABLETS BY MOUTH FOUR TIMES A DAY AS DIRECTED 390 tablet 4  . Lancets (ONETOUCH ULTRASOFT) lancets Check blood sugar 3 times daily due to poorly controlled glucose-DX-E11.65. 100 each 12  . levothyroxine (SYNTHROID, LEVOTHROID) 200 MCG tablet TAKE 1/2 TO - 1 TABLETS BY MOUTH DAILY BEFORE BREAKFAST. ALTERNATE TAKING 100 AND 200MG 90 tablet 1  . lipase/protease/amylase (CREON) 12000 UNITS CPEP capsule Take 1 capsule (12,000 Units total) by mouth 3 (three) times daily. 270 capsule 99  . Magnesium 500 MG TABS Take 1 tablet 2 to 4 x day as directed (Patient taking differently: Take 1,000 mg by mouth daily. )    . metFORMIN (GLUCOPHAGE) 500 MG tablet TAKE 1-2 TABLETS BY MOUTH 3 TIMES DAILY. TAKE 1 TAB AT BREAKFAST, 1 AT LUNCH AND 2 AT DINNER 360 tablet 1  . metolazone (ZAROXOLYN) 2.5 MG tablet TAKE 1 TABLET (2.5 MG TOTAL) BY MOUTH DAILY. 90 tablet 1  . Multiple Vitamins-Minerals (MULTIVITAMIN WITH MINERALS) tablet Take 1 tablet by mouth daily.     . ONGLYZA 5 MG TABS tablet Take 5 mg by mouth daily.  11  . promethazine-dextromethorphan (PROMETHAZINE-DM) 6.25-15 MG/5ML syrup TAKE 1-2 TEASPOONSFUL (5-10MLS) BY MOUTH 4 TIMES A DAY OR EVERY 4 HOURS AS NEEDED FOR COUGH 240 mL 1  . sucralfate (CARAFATE) 1 g tablet TAKE 1 TABLET BY MOUTH 4 TIMES DAILY. 360 tablet 1  . traMADol (ULTRAM) 50 MG tablet Take 2 tablets (100 mg total) by mouth every 6 (six) hours as needed. 30 tablet 0  . Vitamin D, Ergocalciferol, (DRISDOL) 50000 units CAPS capsule TAKE 1 CAPSULE (50,000 UNITS TOTAL) BY MOUTH DAILY. 90 capsule 1   No current facility-administered medications for this visit.    Allergies:   Ace inhibitors; Tilade; Reglan; and Betadine    Social History:  The patient  reports that she quit smoking  about 38 years ago. Her smoking use included Cigarettes. She has a 10 pack-year smoking history. She has never used smokeless tobacco. She reports that she drinks alcohol. She reports that she does not use illicit drugs.   Family History:  The patient's family history includes Alzheimer's disease in her father; Colon cancer in her mother; Diabetes in her father; Emphysema in her father; Glaucoma in her mother; Heart attack (age of onset: 24) in  her brother; Heart attack (age of onset: 57) in her father; Heart disease in her father; Hypertension in her mother; Nephrolithiasis in her brother, father, and sister.    ROS:  Please see the history of present illness.   Otherwise, review of systems are positive for hepatic masses. These have been biopsied x 2. Related to FUO. Thinks may have been Q fever. Now following with serial CTs.   All other systems are reviewed and negative.    PHYSICAL EXAM: VS:  BP 100/64 mmHg  Pulse 68  Ht 5' 7" (1.702 m)  Wt 72.235 kg (159 lb 4 oz)  BMI 24.94 kg/m2 , BMI Body mass index is 24.94 kg/(m^2). GEN: Well nourished, well developed, in no acute distress HEENT: normal Neck: no JVD, carotid bruits, or masses Cardiac: RRR; no murmurs, rubs, or gallops,no edema  Respiratory:  clear to auscultation bilaterally, normal work of breathing GI: soft, nontender, nondistended, + BS, old surgical scars. MS: no deformity or atrophy Skin: warm and dry, no rash Neuro:  Strength and sensation are intact Psych: euthymic mood, full affect   EKG:  EKG is not ordered today. The ekg ordered 03/18/16 demonstrates NSR rate 66. Normal Ecg. I have personally reviewed and interpreted this study.    Recent Labs: 01/25/2016: Magnesium 2.1; TSH 1.97 03/18/2016: ALT 23; BUN 23; Creat 1.27*; Hemoglobin 12.5; Platelets 276; Potassium 3.6; Sodium 136    Lipid Panel    Component Value Date/Time   CHOL 133 01/25/2016 1238   TRIG 143 01/25/2016 1238   HDL 48 01/25/2016 1238   CHOLHDL 2.8  01/25/2016 1238   VLDL 29 01/25/2016 1238   LDLCALC 56 01/25/2016 1238      Wt Readings from Last 3 Encounters:  03/25/16 72.235 kg (159 lb 4 oz)  03/18/16 70.761 kg (156 lb)  02/22/16 74.844 kg (165 lb)      Other studies Reviewed: Additional studies/ records that were reviewed today include: none. Review of the above records demonstrates: N/A   ASSESSMENT AND PLAN:  1.  Chest pain. Given lack of Ecg changes or troponin leak this may well have been a panic attack. She does have multiple risk factors for CAD. I have recommended a Stress Echo to further assess her cardiac risk.   2. 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 On glipizide and metformin.   3. HTN controlled.   4. Family history of CAD  5. Cholangiocarcinoma s/p Whipple procedure with Chemo and RT.  6. S/p gastric bypass.    Current medicines are reviewed at length with the patient today.  The patient does not have concerns regarding medicines.  The following changes have been made:  no change  Labs/ tests ordered today include:  Orders Placed This Encounter  Procedures  . Echo stress     Disposition:   FU with TBD based on stress test results  Signed, Shizuko Wojdyla Martinique, MD  03/25/2016 8:45 PM    [facility] [address], [place], [place], [address] Phone [phone], Fax [phone]

## 2016-03-25 NOTE — Patient Instructions (Signed)
We will schedule you for a stress Echo.   

## 2016-03-27 ENCOUNTER — Other Ambulatory Visit: Payer: Self-pay | Admitting: Internal Medicine

## 2016-03-28 ENCOUNTER — Other Ambulatory Visit: Payer: Self-pay | Admitting: Internal Medicine

## 2016-04-04 ENCOUNTER — Other Ambulatory Visit: Payer: Self-pay | Admitting: Internal Medicine

## 2016-04-05 ENCOUNTER — Other Ambulatory Visit: Payer: Self-pay | Admitting: Internal Medicine

## 2016-04-05 ENCOUNTER — Other Ambulatory Visit: Payer: Self-pay | Admitting: Physician Assistant

## 2016-04-05 ENCOUNTER — Encounter: Payer: Self-pay | Admitting: Internal Medicine

## 2016-04-07 ENCOUNTER — Other Ambulatory Visit: Payer: Self-pay | Admitting: Internal Medicine

## 2016-04-07 DIAGNOSIS — F419 Anxiety disorder, unspecified: Secondary | ICD-10-CM

## 2016-04-07 DIAGNOSIS — K8689 Other specified diseases of pancreas: Secondary | ICD-10-CM

## 2016-04-08 NOTE — Telephone Encounter (Signed)
RX CALLED INTO [facility]. 

## 2016-04-09 ENCOUNTER — Encounter: Payer: Self-pay | Admitting: Cardiology

## 2016-04-12 ENCOUNTER — Telehealth (HOSPITAL_COMMUNITY): Payer: Self-pay | Admitting: *Deleted

## 2016-04-12 NOTE — Telephone Encounter (Signed)
Patient given detailed instructions per Stress Test Requisition Sheet for test on 04/16/16 at 7:30.Patient Notified to arrive 30 minutes early, and that it is imperative to arrive on time for appointment to keep from having the test rescheduled.  Patient verbalized understanding. Courtney Stevens

## 2016-04-14 ENCOUNTER — Encounter: Payer: Self-pay | Admitting: Internal Medicine

## 2016-04-14 ENCOUNTER — Other Ambulatory Visit: Payer: Self-pay | Admitting: Gastroenterology

## 2016-04-14 ENCOUNTER — Other Ambulatory Visit: Payer: Self-pay | Admitting: Internal Medicine

## 2016-04-15 ENCOUNTER — Other Ambulatory Visit: Payer: Self-pay | Admitting: Internal Medicine

## 2016-04-15 DIAGNOSIS — K8689 Other specified diseases of pancreas: Secondary | ICD-10-CM

## 2016-04-15 DIAGNOSIS — K219 Gastro-esophageal reflux disease without esophagitis: Secondary | ICD-10-CM

## 2016-04-15 MED ORDER — DEXLANSOPRAZOLE 60 MG PO CPDR: 60.0000 mg | DELAYED_RELEASE_CAPSULE | Freq: Every day | ORAL | Status: DC

## 2016-04-15 MED ORDER — PANCRELIPASE (LIP-PROT-AMYL) 12000-38000 UNITS PO CPEP: ORAL_CAPSULE | ORAL | Status: DC

## 2016-04-16 ENCOUNTER — Other Ambulatory Visit (HOSPITAL_COMMUNITY): Payer: 59

## 2016-04-16 ENCOUNTER — Ambulatory Visit (HOSPITAL_COMMUNITY): Payer: 59 | Attending: Cardiovascular Disease

## 2016-04-16 ENCOUNTER — Ambulatory Visit (HOSPITAL_BASED_OUTPATIENT_CLINIC_OR_DEPARTMENT_OTHER): Payer: 59

## 2016-04-16 DIAGNOSIS — R079 Chest pain, unspecified: Secondary | ICD-10-CM

## 2016-04-16 DIAGNOSIS — E1122 Type 2 diabetes mellitus with diabetic chronic kidney disease: Secondary | ICD-10-CM | POA: Diagnosis not present

## 2016-04-16 DIAGNOSIS — I10 Essential (primary) hypertension: Secondary | ICD-10-CM

## 2016-04-16 DIAGNOSIS — N189 Chronic kidney disease, unspecified: Secondary | ICD-10-CM | POA: Insufficient documentation

## 2016-04-16 DIAGNOSIS — I491 Atrial premature depolarization: Secondary | ICD-10-CM | POA: Insufficient documentation

## 2016-04-16 DIAGNOSIS — Z8249 Family history of ischemic heart disease and other diseases of the circulatory system: Secondary | ICD-10-CM | POA: Insufficient documentation

## 2016-04-16 DIAGNOSIS — I129 Hypertensive chronic kidney disease with stage 1 through stage 4 chronic kidney disease, or unspecified chronic kidney disease: Secondary | ICD-10-CM | POA: Diagnosis not present

## 2016-04-16 DIAGNOSIS — R0989 Other specified symptoms and signs involving the circulatory and respiratory systems: Secondary | ICD-10-CM

## 2016-04-16 DIAGNOSIS — I493 Ventricular premature depolarization: Secondary | ICD-10-CM | POA: Diagnosis not present

## 2016-04-29 ENCOUNTER — Encounter: Payer: Self-pay | Admitting: Internal Medicine

## 2016-04-29 ENCOUNTER — Ambulatory Visit: Payer: Self-pay | Admitting: Internal Medicine

## 2016-04-29 ENCOUNTER — Ambulatory Visit (INDEPENDENT_AMBULATORY_CARE_PROVIDER_SITE_OTHER): Payer: Managed Care, Other (non HMO) | Admitting: Internal Medicine

## 2016-04-29 VITALS — BP 122/68 | HR 60 | Temp 97.6°F | Resp 16 | Ht 67.0 in

## 2016-04-29 DIAGNOSIS — W19XXXA Unspecified fall, initial encounter: Secondary | ICD-10-CM

## 2016-04-29 DIAGNOSIS — M25531 Pain in right wrist: Secondary | ICD-10-CM

## 2016-04-29 MED ORDER — TRAMADOL HCL 50 MG PO TABS: 50.0000 mg | ORAL_TABLET | Freq: Four times a day (QID) | ORAL | Status: DC | PRN

## 2016-04-29 NOTE — Progress Notes (Signed)
   Subjective:    Patient ID: Courtney Stevens, female    DOB: 01-25-1956, 60 y.o.   MRN: [id number]  Fall Pertinent negatives include no fever.    Patient presents to the office for evaluation after a fall on Saturday.  She reports that she landed on her right hand after tripping over her cat.  She notes that she fells and landed on her right arm and she hit her elbow and her shoulder.  She notes that she is having the most pain in her right wrist.  She has been elevating her hand, wearing a brace on it and taking aleve twice daily.  She is right hand dominant.  Her pain is a six.  She notes that pronating and supinating is the most painful.     Review of Systems  Constitutional: Negative for fever, chills and fatigue.  Musculoskeletal: Positive for joint swelling and arthralgias.  Skin: Positive for color change.       Objective:   Physical Exam  Constitutional: She appears well-developed and well-nourished. No distress.  HENT:  Head: Normocephalic.  Mouth/Throat: Oropharynx is clear and moist. No oropharyngeal exudate.  Eyes: Conjunctivae are normal. No scleral icterus.  Neck: Normal range of motion. Neck supple. No JVD present. No thyromegaly present.  Cardiovascular: Normal rate.   Pulses:      Radial pulses are 2+ on the right side, and 2+ on the left side.  Pulmonary/Chest: Effort normal and breath sounds normal.  Musculoskeletal:       Right wrist: She exhibits decreased range of motion, tenderness, bony tenderness and swelling. She exhibits no effusion, no crepitus, no deformity and no laceration.       Right hand: She exhibits decreased range of motion, tenderness and swelling. She exhibits no bony tenderness, normal two-point discrimination, normal capillary refill, no deformity and no laceration. Normal sensation noted. Normal strength noted.  Lymphadenopathy:    She has no cervical adenopathy.  Skin: She is not diaphoretic.  Nursing note and vitals reviewed.   Filed  Vitals:   04/29/16 1517  BP: 122/68  Pulse: 60  Temp: 97.6 F (36.4 C)  Resp: 16          Assessment & Plan:    1. Right wrist pain -right cockup wrist splint -cont tramadol -cont aleve -RICE therapy - DG Wrist Complete Right; Future  2. Fall, initial encounter

## 2016-05-06 ENCOUNTER — Other Ambulatory Visit: Payer: Self-pay | Admitting: Internal Medicine

## 2016-05-09 ENCOUNTER — Other Ambulatory Visit: Payer: Self-pay | Admitting: *Deleted

## 2016-05-09 MED ORDER — SUCRALFATE 1 G PO TABS: ORAL_TABLET | ORAL | Status: DC

## 2016-05-14 ENCOUNTER — Ambulatory Visit: Payer: Self-pay | Admitting: Internal Medicine

## 2016-05-16 ENCOUNTER — Other Ambulatory Visit: Payer: Self-pay | Admitting: *Deleted

## 2016-05-16 ENCOUNTER — Encounter: Payer: Self-pay | Admitting: Internal Medicine

## 2016-05-16 MED ORDER — VITAMIN D (ERGOCALCIFEROL) 1.25 MG (50000 UNIT) PO CAPS: ORAL_CAPSULE | ORAL | Status: DC

## 2016-05-16 MED ORDER — ONGLYZA 5 MG PO TABS: 5.0000 mg | ORAL_TABLET | Freq: Every day | ORAL | Status: DC

## 2016-05-24 ENCOUNTER — Encounter: Payer: Self-pay | Admitting: Internal Medicine

## 2016-05-24 ENCOUNTER — Other Ambulatory Visit: Payer: Self-pay | Admitting: *Deleted

## 2016-05-24 DIAGNOSIS — K219 Gastro-esophageal reflux disease without esophagitis: Secondary | ICD-10-CM

## 2016-05-24 DIAGNOSIS — K8689 Other specified diseases of pancreas: Secondary | ICD-10-CM

## 2016-05-24 DIAGNOSIS — E1121 Type 2 diabetes mellitus with diabetic nephropathy: Secondary | ICD-10-CM

## 2016-05-24 DIAGNOSIS — F419 Anxiety disorder, unspecified: Secondary | ICD-10-CM

## 2016-05-24 MED ORDER — GLIPIZIDE 5 MG PO TABS: ORAL_TABLET | ORAL | Status: DC

## 2016-05-24 MED ORDER — LEVOTHYROXINE SODIUM 200 MCG PO TABS: ORAL_TABLET | ORAL | Status: DC

## 2016-05-24 MED ORDER — DEXLANSOPRAZOLE 60 MG PO CPDR: 60.0000 mg | DELAYED_RELEASE_CAPSULE | Freq: Every day | ORAL | Status: DC

## 2016-05-24 MED ORDER — ONGLYZA 5 MG PO TABS: 5.0000 mg | ORAL_TABLET | Freq: Every day | ORAL | Status: DC

## 2016-05-24 MED ORDER — PANCRELIPASE (LIP-PROT-AMYL) 12000-38000 UNITS PO CPEP: ORAL_CAPSULE | ORAL | Status: DC

## 2016-05-24 MED ORDER — ALPRAZOLAM 1 MG PO TABS: ORAL_TABLET | ORAL | Status: DC

## 2016-05-24 MED ORDER — VITAMIN D (ERGOCALCIFEROL) 1.25 MG (50000 UNIT) PO CAPS: ORAL_CAPSULE | ORAL | Status: DC

## 2016-05-27 ENCOUNTER — Other Ambulatory Visit: Payer: Self-pay | Admitting: *Deleted

## 2016-05-27 MED ORDER — SITAGLIPTIN PHOSPHATE 100 MG PO TABS: 100.0000 mg | ORAL_TABLET | Freq: Every day | ORAL | Status: DC

## 2016-06-04 ENCOUNTER — Other Ambulatory Visit: Payer: Self-pay | Admitting: *Deleted

## 2016-06-04 MED ORDER — PANTOPRAZOLE SODIUM 40 MG PO TBEC: 40.0000 mg | DELAYED_RELEASE_TABLET | Freq: Every day | ORAL | Status: DC

## 2016-06-25 ENCOUNTER — Ambulatory Visit
Admission: RE | Admit: 2016-06-25 | Discharge: 2016-06-25 | Disposition: A | Discharge status: Home / Self Care | Payer: Managed Care, Other (non HMO) | Source: Ambulatory Visit | Attending: Internal Medicine | Admitting: Internal Medicine

## 2016-06-25 ENCOUNTER — Other Ambulatory Visit: Payer: Self-pay | Admitting: Internal Medicine

## 2016-06-25 DIAGNOSIS — Z1231 Encounter for screening mammogram for malignant neoplasm of breast: Secondary | ICD-10-CM

## 2016-06-29 ENCOUNTER — Other Ambulatory Visit: Payer: Self-pay | Admitting: Internal Medicine

## 2016-07-02 ENCOUNTER — Other Ambulatory Visit: Payer: Self-pay | Admitting: *Deleted

## 2016-07-02 ENCOUNTER — Encounter: Payer: Self-pay | Admitting: Internal Medicine

## 2016-07-02 DIAGNOSIS — Z79899 Other long term (current) drug therapy: Secondary | ICD-10-CM

## 2016-07-02 DIAGNOSIS — K8689 Other specified diseases of pancreas: Secondary | ICD-10-CM

## 2016-07-02 DIAGNOSIS — E1121 Type 2 diabetes mellitus with diabetic nephropathy: Secondary | ICD-10-CM

## 2016-07-02 DIAGNOSIS — F419 Anxiety disorder, unspecified: Secondary | ICD-10-CM

## 2016-07-02 MED ORDER — SITAGLIPTIN PHOSPHATE 100 MG PO TABS: 100.0000 mg | ORAL_TABLET | Freq: Every day | ORAL | 1 refills | Status: DC

## 2016-07-02 MED ORDER — METFORMIN HCL 500 MG PO TABS: ORAL_TABLET | ORAL | 1 refills | Status: DC

## 2016-07-02 MED ORDER — ALPRAZOLAM 1 MG PO TABS: ORAL_TABLET | ORAL | 1 refills | Status: DC

## 2016-07-02 MED ORDER — LEVOTHYROXINE SODIUM 200 MCG PO TABS: ORAL_TABLET | ORAL | 1 refills | Status: DC

## 2016-07-02 MED ORDER — VITAMIN D (ERGOCALCIFEROL) 1.25 MG (50000 UNIT) PO CAPS: ORAL_CAPSULE | ORAL | 1 refills | Status: DC

## 2016-07-02 MED ORDER — GLIPIZIDE 5 MG PO TABS: ORAL_TABLET | ORAL | 1 refills | Status: DC

## 2016-07-02 MED ORDER — SUCRALFATE 1 G PO TABS: ORAL_TABLET | ORAL | 1 refills | Status: DC

## 2016-07-02 MED ORDER — FUROSEMIDE 40 MG PO TABS: ORAL_TABLET | ORAL | 1 refills | Status: DC

## 2016-07-02 MED ORDER — PANCRELIPASE (LIP-PROT-AMYL) 12000-38000 UNITS PO CPEP: ORAL_CAPSULE | ORAL | 1 refills | Status: DC

## 2016-07-02 MED ORDER — PANTOPRAZOLE SODIUM 40 MG PO TBEC: 40.0000 mg | DELAYED_RELEASE_TABLET | Freq: Every day | ORAL | 1 refills | Status: DC

## 2016-07-31 ENCOUNTER — Encounter: Payer: Self-pay | Admitting: Physician Assistant

## 2016-07-31 ENCOUNTER — Ambulatory Visit (INDEPENDENT_AMBULATORY_CARE_PROVIDER_SITE_OTHER): Payer: Managed Care, Other (non HMO) | Admitting: Physician Assistant

## 2016-07-31 VITALS — BP 118/80 | HR 85 | Temp 97.2°F | Resp 16 | Ht 67.0 in | Wt 163.0 lb

## 2016-07-31 DIAGNOSIS — C240 Malignant neoplasm of extrahepatic bile duct: Secondary | ICD-10-CM | POA: Diagnosis not present

## 2016-07-31 DIAGNOSIS — E782 Mixed hyperlipidemia: Secondary | ICD-10-CM | POA: Diagnosis not present

## 2016-07-31 DIAGNOSIS — E559 Vitamin D deficiency, unspecified: Secondary | ICD-10-CM | POA: Diagnosis not present

## 2016-07-31 DIAGNOSIS — E039 Hypothyroidism, unspecified: Secondary | ICD-10-CM

## 2016-07-31 DIAGNOSIS — N182 Chronic kidney disease, stage 2 (mild): Secondary | ICD-10-CM | POA: Diagnosis not present

## 2016-07-31 DIAGNOSIS — I10 Essential (primary) hypertension: Secondary | ICD-10-CM

## 2016-07-31 DIAGNOSIS — M25532 Pain in left wrist: Secondary | ICD-10-CM | POA: Diagnosis not present

## 2016-07-31 DIAGNOSIS — E1122 Type 2 diabetes mellitus with diabetic chronic kidney disease: Secondary | ICD-10-CM | POA: Diagnosis not present

## 2016-07-31 DIAGNOSIS — Z79899 Other long term (current) drug therapy: Secondary | ICD-10-CM

## 2016-07-31 LAB — HEPATIC FUNCTION PANEL
ALT: 24 U/L (ref 6–29)
AST: 30 U/L (ref 10–35)
Albumin: 4 g/dL (ref 3.6–5.1)
Alkaline Phosphatase: 160 U/L — ABNORMAL HIGH (ref 33–130)
Bilirubin, Direct: 0.1 mg/dL (ref <=0.2)
Indirect Bilirubin: 0.3 mg/dL (ref 0.2–1.2)
Total Bilirubin: 0.4 mg/dL (ref 0.2–1.2)
Total Protein: 7 g/dL (ref 6.1–8.1)

## 2016-07-31 LAB — LIPID PANEL
Cholesterol: 127 mg/dL (ref 125–200)
HDL: 41 mg/dL — ABNORMAL LOW (ref >=46)
LDL Cholesterol: 65 mg/dL (ref <130)
Total CHOL/HDL Ratio: 3.1 Ratio (ref <=5.0)
Triglycerides: 107 mg/dL (ref <150)
VLDL: 21 mg/dL (ref <30)

## 2016-07-31 LAB — BASIC METABOLIC PANEL WITH GFR
BUN: 14 mg/dL (ref 7–25)
CO2: 25 mmol/L (ref 20–31)
CREATININE: 1.05 mg/dL — AB (ref 0.50–0.99)
Calcium: 9.6 mg/dL (ref 8.6–10.4)
Chloride: 101 mmol/L (ref 98–110)
GFR, EST AFRICAN AMERICAN: 66 mL/min (ref >=60)
GFR, Est Non African American: 57 mL/min — ABNORMAL LOW (ref >=60)
GLUCOSE: 227 mg/dL — AB (ref 65–99)
POTASSIUM: 4.4 mmol/L (ref 3.5–5.3)
Sodium: 139 mmol/L (ref 135–146)

## 2016-07-31 LAB — URIC ACID: URIC ACID, SERUM: 5.7 mg/dL (ref 2.5–7.0)

## 2016-07-31 LAB — MAGNESIUM: MAGNESIUM: 2.1 mg/dL (ref 1.5–2.5)

## 2016-07-31 MED ORDER — AMITRIPTYLINE HCL 25 MG PO TABS: ORAL_TABLET | ORAL | 0 refills | Status: DC

## 2016-07-31 NOTE — Progress Notes (Signed)
Assessment and Plan:  1. Hypertension -Continue medication, monitor blood pressure at home. Continue DASH diet.  Reminder to go to the ER if any CP, SOB, nausea, dizziness, severe HA, changes vision/speech, left arm numbness and tingling and jaw pain.  2. Cholesterol -Continue diet and exercise. Check cholesterol.   3. Diabetes with diabetic chronic kidney disease -Continue diet and exercise. Check A1C - discussed insulin today, being s/p whipple with pancreatic insufficiency there is a good possibility that she has become insulin dependent, discussed with patient, will check A1C this time if elevated will bring in for DM visit to add on insulin and stop oral meds.   4. Vitamin D Def - check level and continue medications.   5. Hypothyroidism -check TSH level, continue medications the same, reminded to take on an empty stomach 30-4mns before food.   6. GERD Stop vitamin C, continue PPI for now, try to avoid NSAIDS, follow up Dr. KDeatra Ina   7. Left arm pain Vesicles along dermatome distribution, has had shingles shot, not much pain, will monitor, add amitriptyline.   8. Leg pain at night ? Myalgias, check labs versus uncontrolled sugars versus RLS- try amitriptyline at night for sleep, may switch to lyrica if not helping, likely will need insulin.   Continue diet and meds as discussed. Further disposition pending results of labs. Discussed med's effects and SE's.   Over 30 minutes of exam, counseling, chart review, and critical decision making was performed   HPI 60y.o. female  presents for 3 month follow up on hypertension, cholesterol, diabetes and vitamin D deficiency.   Her blood pressure has been controlled at home, today her BP is BP: 118/80. She woke up Monday and her left arm was hurting her, she has some rash on her left arm. Joint of 3rd right hand hurts. She fell at work and hurt her tail bone, and right wrist was broken from fall, going to see ortho.  Patient also has  history of klatkin cholangiocarcinoma s/p whipple in 2009. She is following with [facility] for FUO. Saw oncologist, had MRI and will have repeat in Dec, possible reoccourance. She has an appointment with Dr. KDeatra Inaon the 24th and is on creon for pancreatic insufficiency  She does not workout. She denies chest pain, shortness of breath, dizziness. She is on zaroxyolyn and lasix for edema. She had a normal stress echo 04/16/2016 with Dr. JMartinique   She is on cholesterol medication and denies myalgias. Her cholesterol is at goal. The cholesterol was:   Lab Results  Component Value Date   CHOL 133 01/25/2016   HDL 48 01/25/2016   LDLCALC 56 01/25/2016   TRIG 143 01/25/2016   CHOLHDL 2.8 01/25/2016    She has been working on diet and exercise for diabetes with diabetic chronic kidney disease, she is on bASA, she is not on ACE/ARB, she is on glipizide 5 mg TID, metformin, januvia, admits that she has not been taking her medications well due to stress , and denies  paresthesia of the feet, polydipsia, polyuria and visual disturbances. Last A1C was:  Lab Results  Component Value Date   HGBA1C 9.6 (H) 01/25/2016    Patient is on Vitamin D supplement. Lab Results  Component Value Date   VD25OH 94 01/25/2016   She is on thyroid medication, she is on 1048mone day and 20041mhe other day. Her medication was not changed last visit.   Lab Results  Component Value Date   TSH 1.97 01/25/2016  .  She is complaining of allergy symptoms, she is on zpak. She has sore throat, sneezing, clear mucus, nocturnal cough, denies fever, chills.  Best friend, Tomie China, passed Monday, she is doing funeral arrangements and mother had a stroke.  BMI is Body mass index is 25.53 kg/m., she is working on diet and exercise. Wt Readings from Last 3 Encounters:  07/31/16 163 lb (73.9 kg)  03/25/16 159 lb 4 oz (72.2 kg)  03/18/16 156 lb (70.8 kg)     Current Medications:  Current Outpatient Prescriptions on File Prior  to Visit  Medication Sig Dispense Refill  . albuterol (VENTOLIN HFA) 108 (90 BASE) MCG/ACT inhaler Inhale 2 puffs into the lungs every 4 (four) hours as needed for wheezing or shortness of breath. 1 Inhaler 4  . ALPRAZolam (XANAX) 1 MG tablet TAKE 1/2 TO 1 TABLET BY MOUTH 3 TIMES A DAY AS NEEDED 270 tablet 1  . azelastine (ASTELIN) 0.1 % nasal spray PLACE 2 SPRAYS INTO BOTH NOSTRILS 2 (TWO) TIMES DAILY. USE IN EACH NOSTRIL AS DIRECTED 90 mL 1  . Blood Glucose Monitoring Suppl (ONE TOUCH ULTRA SYSTEM KIT) w/Device KIT 1 kit by Does not apply route once. 1 each 0  . calcium citrate-vitamin D (CITRACAL+D) 315-200 MG-UNIT per tablet Take 1 tablet by mouth 3 (three) times daily.    . cetirizine (ZYRTEC) 10 MG tablet Take 10 mg by mouth every morning.      . ferrous sulfate 325 (65 FE) MG tablet Take 325 mg by mouth daily with breakfast.    . fluticasone (FLONASE) 50 MCG/ACT nasal spray PLACE 2 SPRAYS INTO BOTH NOSTRILS DAILY. 9.9 g 0  . furosemide (LASIX) 40 MG tablet Take 1 to 2 tablets 2 x day for fluid & swelling 360 tablet 1  . glipiZIDE (GLUCOTROL) 5 MG tablet Take 1/2 to 1 tab 3 x day if needed for elevated glucose 270 tablet 1  . glucose blood (ONE TOUCH TEST STRIPS) test strip CHECK BLOOD SUGAR 3 TIMES DAILY DUE TO POORLY CONTROLLED GLUCOSE-DX-E11.65 100 each 12  . KLOR-CON M20 20 MEQ tablet TAKE 4 TABLETS BY MOUTH FOUR TIMES A DAY AS DIRECTED 390 tablet 4  . Lancets (ONETOUCH ULTRASOFT) lancets Check blood sugar 3 times daily due to poorly controlled glucose-DX-E11.65. 100 each 12  . levothyroxine (SYNTHROID, LEVOTHROID) 200 MCG tablet TAKE 1/2 TO - 1 TABLETS BY MOUTH DAILY BEFORE BREAKFAST. ALTERNATE TAKING 100 AND 200MG 90 tablet 1  . lipase/protease/amylase (CREON) 12000 units CPEP capsule TAKE 1 CAPSULE BY MOUTH 3 TIMES DAILY. 270 capsule 1  . Magnesium 500 MG TABS Take 1 tablet 2 to 4 x day as directed (Patient taking differently: Take 1,000 mg by mouth daily. )    . metFORMIN (GLUCOPHAGE)  500 MG tablet TAKE 1-2 TABLETS BY MOUTH 3 TIMES DAILY. TAKE 1 TAB AT BREAKFAST, 1 AT LUNCH AND 2 AT DINNER 360 tablet 1  . metolazone (ZAROXOLYN) 2.5 MG tablet TAKE 1 TABLET (2.5 MG TOTAL) BY MOUTH DAILY. 90 tablet 1  . Multiple Vitamins-Minerals (MULTIVITAMIN WITH MINERALS) tablet Take 1 tablet by mouth daily.     . sitaGLIPtin (JANUVIA) 100 MG tablet Take 1 tablet (100 mg total) by mouth daily. 90 tablet 1  . sucralfate (CARAFATE) 1 g tablet TAKE 1 TABLET BY MOUTH 4 TIMES DAILY. 360 tablet 1  . Vitamin D, Ergocalciferol, (DRISDOL) 50000 units CAPS capsule TAKE 1 CAPSULE (50,000 UNITS TOTAL) BY MOUTH DAILY. 30 capsule 1   No current facility-administered medications on  file prior to visit.    Medical History:  Past Medical History:  Diagnosis Date  . Anemia    History of blood transfusion which is when pt reports being exposed to hepatitis in 2009.  Marland Kitchen Anxiety   . Asthma   . Cancer ([place])    Klatkin bile duct ca (2009)   . CKD (chronic kidney disease), stage II   . Diabetes mellitus   . GERD (gastroesophageal reflux disease)   . Hepatitis   . Hyperlipidemia   . Hypertension   . Hypothyroid    Allergies:  Allergies  Allergen Reactions  . Ace Inhibitors Anaphylaxis  . Tilade [Nedocromil] Anaphylaxis  . Reglan [Metoclopramide] Nausea And Vomiting  . Betadine [Povidone Iodine] Rash    iching      Review of Systems:  Review of Systems  Constitutional: Negative for chills, diaphoresis, fever, malaise/fatigue and weight loss.  HENT: Negative.   Respiratory: Negative.   Cardiovascular: Negative for chest pain, palpitations, orthopnea, claudication, leg swelling and PND.  Gastrointestinal: Negative for abdominal pain, blood in stool, constipation, diarrhea, heartburn, melena, nausea and vomiting.  Genitourinary: Negative.   Musculoskeletal: Positive for falls, joint pain and myalgias.  Skin: Positive for rash (left arm).  Neurological: Negative.  Negative for weakness.   Psychiatric/Behavioral: Positive for depression. Negative for hallucinations, memory loss, substance abuse and suicidal ideas. The patient is not nervous/anxious and does not have insomnia.     Family history- Review and unchanged Social history- Review and unchanged Physical Exam: BP 118/80   Pulse 85   Temp 97.2 F (36.2 C)   Resp 16   Ht '5\' 7"'  (1.702 m)   Wt 163 lb (73.9 kg)   SpO2 99%   BMI 25.53 kg/m  Wt Readings from Last 3 Encounters:  07/31/16 163 lb (73.9 kg)  03/25/16 159 lb 4 oz (72.2 kg)  03/18/16 156 lb (70.8 kg)   General Appearance: Well nourished, in no apparent distress. Eyes: PERRLA, EOMs, conjunctiva no swelling or erythema Sinuses: No Frontal/maxillary tenderness ENT/Mouth: Ext aud canals clear, TMs without erythema, bulging. No erythema, swelling, or exudate on post pharynx.  Tonsils not swollen or erythematous. Hearing normal.  Neck: Supple, thyroid normal.  Respiratory: Respiratory effort normal, BS equal bilaterally without rales, rhonchi, wheezing or stridor.  Cardio: RRR with no MRGs. Brisk peripheral pulses without edema.  Abdomen: Soft, + BS.  Non tender, no guarding, rebound, hernias, masses. Lymphatics: Non tender without lymphadenopathy.  Musculoskeletal: Full ROM, 5/5 strength, Normal gait, right wrist with pain to palpation at wrist and with flexion. Left hand/arm with 5-6 erythematous vesicles along dermatome,mild warmth at one vesicle.  Skin: Warm, dry without rashes, lesions, ecchymosis.  Neuro: Cranial nerves intact. No cerebellar symptoms.  Psych: Awake and oriented X 3, normal affect, Insight and Judgment appropriate.    Vicie Mutters, PA-C 9:01 AM [facility]

## 2016-08-01 LAB — CBC WITH DIFFERENTIAL/PLATELET
BASOS ABS: 0 {cells}/uL (ref 0–200)
BASOS PCT: 0 %
EOS ABS: 154 {cells}/uL (ref 15–500)
Eosinophils Relative: 2 %
HEMATOCRIT: 41.4 % (ref 35.0–45.0)
Hemoglobin: 13.6 g/dL (ref 11.7–15.5)
LYMPHS PCT: 17 %
Lymphs Abs: 1309 cells/uL (ref 850–3900)
MCH: 28.7 pg (ref 27.0–33.0)
MCHC: 32.9 g/dL (ref 32.0–36.0)
MCV: 87.3 fL (ref 80.0–100.0)
MONO ABS: 462 {cells}/uL (ref 200–950)
MONOS PCT: 6 %
MPV: 9.8 fL (ref 7.5–12.5)
NEUTROS PCT: 75 %
Neutro Abs: 5775 cells/uL (ref 1500–7800)
PLATELETS: 288 10*3/uL (ref 140–400)
RBC: 4.74 MIL/uL (ref 3.80–5.10)
RDW: 13.9 % (ref 11.0–15.0)
WBC: 7.7 10*3/uL (ref 3.8–10.8)

## 2016-08-01 LAB — HEMOGLOBIN A1C
HEMOGLOBIN A1C: 8.4 % — AB (ref <5.7)
Mean Plasma Glucose: 194 mg/dL

## 2016-08-01 LAB — VITAMIN D 25 HYDROXY (VIT D DEFICIENCY, FRACTURES): VIT D 25 HYDROXY: 90 ng/mL (ref 30–100)

## 2016-08-01 LAB — TSH: TSH: 3.63 m[IU]/L

## 2016-08-08 ENCOUNTER — Other Ambulatory Visit: Payer: Self-pay | Admitting: Internal Medicine

## 2016-08-08 ENCOUNTER — Other Ambulatory Visit: Payer: 59

## 2016-08-08 DIAGNOSIS — R300 Dysuria: Secondary | ICD-10-CM

## 2016-08-08 DIAGNOSIS — N390 Urinary tract infection, site not specified: Secondary | ICD-10-CM

## 2016-08-09 LAB — URINALYSIS, ROUTINE W REFLEX MICROSCOPIC
Bilirubin Urine: NEGATIVE
HGB URINE DIPSTICK: NEGATIVE
KETONES UR: NEGATIVE
Leukocytes, UA: NEGATIVE
NITRITE: NEGATIVE
Protein, ur: NEGATIVE
Specific Gravity, Urine: 1.007 (ref 1.001–1.035)
pH: 6.5 (ref 5.0–8.0)

## 2016-08-09 LAB — URINALYSIS, MICROSCOPIC ONLY
Bacteria, UA: NONE SEEN [HPF]
CASTS: NONE SEEN [LPF]
Crystals: NONE SEEN [HPF]
SQUAMOUS EPITHELIAL / LPF: NONE SEEN [HPF] (ref <=5)
WBC, UA: NONE SEEN WBC/HPF (ref <=5)
YEAST: NONE SEEN [HPF]

## 2016-08-11 LAB — URINE CULTURE

## 2016-08-12 ENCOUNTER — Other Ambulatory Visit: Payer: Self-pay | Admitting: Internal Medicine

## 2016-08-12 DIAGNOSIS — N390 Urinary tract infection, site not specified: Secondary | ICD-10-CM

## 2016-08-12 MED ORDER — CEFUROXIME AXETIL 250 MG PO TABS: ORAL_TABLET | ORAL | 0 refills | Status: AC

## 2016-08-14 ENCOUNTER — Ambulatory Visit (INDEPENDENT_AMBULATORY_CARE_PROVIDER_SITE_OTHER): Payer: Managed Care, Other (non HMO) | Admitting: Internal Medicine

## 2016-08-14 ENCOUNTER — Other Ambulatory Visit: Payer: Self-pay | Admitting: *Deleted

## 2016-08-14 ENCOUNTER — Encounter: Payer: Self-pay | Admitting: Internal Medicine

## 2016-08-14 VITALS — BP 144/96 | HR 84 | Temp 97.6°F | Resp 16 | Ht 67.0 in | Wt 160.2 lb

## 2016-08-14 DIAGNOSIS — M545 Low back pain, unspecified: Secondary | ICD-10-CM

## 2016-08-14 DIAGNOSIS — N390 Urinary tract infection, site not specified: Secondary | ICD-10-CM

## 2016-08-14 MED ORDER — NITROFURANTOIN MONOHYD MACRO 100 MG PO CAPS: ORAL_CAPSULE | ORAL | 0 refills | Status: AC

## 2016-08-14 MED ORDER — FLUCONAZOLE 150 MG PO TABS: ORAL_TABLET | ORAL | 0 refills | Status: DC

## 2016-08-14 MED ORDER — PREDNISONE 20 MG PO TABS: ORAL_TABLET | ORAL | 0 refills | Status: DC

## 2016-08-14 NOTE — Progress Notes (Signed)
Subjective:    Patient ID: Courtney Stevens, female    DOB: Jan 24, 1956, 60 y.o.   MRN: [id number]  HPI Patient has hx/o Klatkin biliary cancer felt recurrent and followed at [facility]. Recently she was treated for a UTI and f/u urine c/s showed a moderately resistant E.coli and she's been on Ceftin for 2 days and presents with persistent dysuria,  lower abdominal and lower back discomfort. Denies fevers, chill, sweats, rashes or other GI sx'.  She's tearful about job stresses and [employer] production [employer].   Medication Sig  . albuterol (VENTOLIN HFA) 108 (90 BASE) MCG/ACT inhaler Inhale 2 puffs into the lungs every 4 (four) hours as needed for wheezing or shortness of breath.  . ALPRAZolam (XANAX) 1 MG tablet TAKE 1/2 TO 1 TABLET BY MOUTH 3 TIMES A DAY AS NEEDED  . azelastine (ASTELIN) 0.1 % nasal spray PLACE 2 SPRAYS INTO BOTH NOSTRILS 2 (TWO) TIMES DAILY. USE IN EACH NOSTRIL AS DIRECTED  . calcium citrate-vitamin D (CITRACAL+D) 315-200 MG-UNIT per tablet Take 1 tablet by mouth 3 (three) times daily.  . cefUROXime (CEFTIN) 250 MG tablet Take 1 tablet 2 x/day with food for infection  . cetirizine (ZYRTEC) 10 MG tablet Take 10 mg by mouth every morning.    . ferrous sulfate 325 (65 FE) MG tablet Take 325 mg by mouth daily with breakfast.  . fluticasone (FLONASE) 50 MCG/ACT nasal spray PLACE 2 SPRAYS INTO BOTH NOSTRILS DAILY.  . furosemide (LASIX) 40 MG tablet Take 1 to 2 tablets 2 x day for fluid & swelling  . glipiZIDE (GLUCOTROL) 5 MG tablet Take 1/2 to 1 tab 3 x day if needed for elevated glucose  . KLOR-CON M20 20 MEQ tablet TAKE 4 TABLETS BY MOUTH FOUR TIMES A DAY AS DIRECTED  . levothyroxine (SYNTHROID, LEVOTHROID) 200 MCG tablet TAKE 1/2 TO - 1 TABLETS BY MOUTH DAILY BEFORE BREAKFAST. ALTERNATE TAKING 100 AND 200MG   . lipase/protease/amylase (CREON) 12000 units CPEP capsule TAKE 1 CAPSULE BY MOUTH 3 TIMES DAILY.  . Magnesium 500 MG TABS Take 1 tablet 2 to 4 x day as directed (Patient taking  differently: Take 1,000 mg by mouth daily. )  . metFORMIN (GLUCOPHAGE) 500 MG tablet TAKE 1-2 TABLETS BY MOUTH 3 TIMES DAILY. TAKE 1 TAB AT BREAKFAST, 1 AT LUNCH AND 2 AT [facility]  . metolazone (ZAROXOLYN) 2.5 MG tablet TAKE 1 TABLET (2.5 MG TOTAL) BY MOUTH DAILY.  . Multiple Vitamins-Minerals (MULTIVITAMIN WITH MINERALS) tablet Take 1 tablet by mouth daily.   . sitaGLIPtin (JANUVIA) 100 MG tablet Take 1 tablet (100 mg total) by mouth daily.  . sucralfate (CARAFATE) 1 g tablet TAKE 1 TABLET BY MOUTH 4 TIMES DAILY.  Marland Kitchen Vitamin D, Ergocalciferol, (DRISDOL) 50000 units CAPS capsule TAKE 1 CAPSULE (50,000 UNITS TOTAL) BY MOUTH DAILY.  Marland Kitchen amitriptyline (ELAVIL) 25 MG tablet 1/2-2 tablets at night for leg pain (Patient not taking: Reported on 08/14/2016)   Past Medical History:  Diagnosis Date  . Anemia    History of blood transfusion which is when pt reports being exposed to hepatitis in 2009.  Marland Kitchen Anxiety   . Asthma   . Cancer ([place])    Klatkin bile duct ca (2009)   . CKD (chronic kidney disease), stage II   . Diabetes mellitus   . GERD (gastroesophageal reflux disease)   . Hepatitis   . Hyperlipidemia   . Hypertension   . Hypothyroid    Past Surgical History:  Procedure Laterality Date  . ABDOMINAL  HYSTERECTOMY  1990   BIL OOPHORECTOMY  . APPENDECTOMY    . Bile Duct Resection  2009  . BREAST SURGERY     BIOPSY NEG  . BUNIONECTOMY Right 2005  . CHOLECYSTECTOMY  1992  . GASTRIC BYPASS  2004  . TONSILLECTOMY    . TUBAL LIGATION    . VIDEO BRONCHOSCOPY WITH ENDOBRONCHIAL ULTRASOUND N/A 10/12/2014   Procedure: VIDEO BRONCHOSCOPY WITH ENDOBRONCHIAL ULTRASOUND;  Surgeon: Brand Males, MD;  Location: [place];  Service: Thoracic;  Laterality: N/A;   Past Surgical History:  Procedure Laterality Date  . ABDOMINAL HYSTERECTOMY  1990   BIL OOPHORECTOMY  . APPENDECTOMY    . Bile Duct Resection  2009  . BREAST SURGERY     BIOPSY NEG  . BUNIONECTOMY Right 2005  . CHOLECYSTECTOMY  1992  .  GASTRIC BYPASS  2004  . TONSILLECTOMY    . TUBAL LIGATION    . VIDEO BRONCHOSCOPY WITH ENDOBRONCHIAL ULTRASOUND N/A 10/12/2014   Procedure: VIDEO BRONCHOSCOPY WITH ENDOBRONCHIAL ULTRASOUND;  Surgeon: Brand Males, MD;  Location: [facility];  Service: Thoracic;  Laterality: N/A;   Review of Systems  In addition to the HPI above,  No Fever-chills,  No Headache, No changes with Vision or hearing,  No problems swallowing food or Liquids,  No Chest pain or productive Cough or Shortness of Breath,  No Nausea or Vomitting, Bowel movements are regular,  No Blood in stool or Urine,  No new skin rashes or bruises,  No new joints pains-aches,  No new weakness, tingling, numbness in any extremity,  No recent weight loss,  No polyuria, polydypsia or polyphagia,  No significant Mental Stressors.  A full 10 point Review of Systems was done, except as stated above, all other Review of Systems were negative    Objective:   Physical Exam  BP (!) 144/96   Pulse 84   Temp 97.6 F (36.4 C)   Resp 16   Ht 5\' 7"  (1.702 m)   Wt 160 lb 3.2 oz (72.7 kg)   BMI 25.09 kg/m   HEENT - Eac's patent. TM's Nl. EOM's full. PERRLA. NasoOroPharynx clear. Neck - supple. Nl Thyroid. Carotids 2+ & No bruits, nodes, JVD Chest - Clear equal BS w/o Rales, rhonchi, wheezes. Cor - Nl HS. RRR w/o sig MGR. PP 1(+). No edema. Abd - Soft w/o palpable organomegaly, masses and does has slight lower abdominal tenderness. BS nl. MS- FROM w/o deformities. Muscle power, tone and bulk Nl. Gait Nl. Tender low para lumbar spasm. Neuro - No obvious Cr N abnormalities. Sensory, motor and Cerebellar functions appear Nl w/o focal abnormalities. Psyche - Mental status- tearful.    Assessment & Plan:   1. Urinary tract infection  - nitrofurantoin, macrocrystal-monohydrate, (MACROBID) 100 MG capsule; Take 1 capsule 2  X / day with food for UTI  Dispense: 20 capsule; Refill: 0  2. Bilateral low back pain without sciatica  -  predniSONE (DELTASONE) 20 MG tablet; 1 tab 3 x day for 3 days, then 1 tab 2 x day for 3 days, then 1 tab 1 x day for 5 days  Dispense: 20 tablet; Refill: 0

## 2016-08-21 ENCOUNTER — Other Ambulatory Visit: Payer: Self-pay | Admitting: Internal Medicine

## 2016-08-27 ENCOUNTER — Other Ambulatory Visit: Payer: Self-pay | Admitting: Physician Assistant

## 2016-09-12 ENCOUNTER — Other Ambulatory Visit: Payer: Self-pay | Admitting: Internal Medicine

## 2016-09-12 ENCOUNTER — Other Ambulatory Visit (INDEPENDENT_AMBULATORY_CARE_PROVIDER_SITE_OTHER): Payer: Managed Care, Other (non HMO)

## 2016-09-12 DIAGNOSIS — N3000 Acute cystitis without hematuria: Secondary | ICD-10-CM

## 2016-09-12 DIAGNOSIS — N390 Urinary tract infection, site not specified: Secondary | ICD-10-CM | POA: Diagnosis not present

## 2016-09-12 DIAGNOSIS — Z23 Encounter for immunization: Secondary | ICD-10-CM | POA: Diagnosis not present

## 2016-09-13 LAB — URINE CULTURE: Organism ID, Bacteria: NO GROWTH

## 2016-09-13 LAB — URINALYSIS, ROUTINE W REFLEX MICROSCOPIC
Bilirubin Urine: NEGATIVE
HGB URINE DIPSTICK: NEGATIVE
KETONES UR: NEGATIVE
Leukocytes, UA: NEGATIVE
NITRITE: NEGATIVE
PH: 6.5 (ref 5.0–8.0)
Protein, ur: NEGATIVE
Specific Gravity, Urine: 1.013 (ref 1.001–1.035)

## 2016-09-13 LAB — URINALYSIS, MICROSCOPIC ONLY
Bacteria, UA: NONE SEEN [HPF]
CASTS: NONE SEEN [LPF]
CRYSTALS: NONE SEEN [HPF]
RBC / HPF: NONE SEEN RBC/HPF (ref <=2)
Squamous Epithelial / LPF: NONE SEEN [HPF] (ref <=5)
WBC UA: NONE SEEN WBC/HPF (ref <=5)
Yeast: NONE SEEN [HPF]

## 2016-09-16 ENCOUNTER — Encounter: Payer: Self-pay | Admitting: Internal Medicine

## 2016-09-22 ENCOUNTER — Encounter: Payer: Self-pay | Admitting: *Deleted

## 2016-10-14 ENCOUNTER — Other Ambulatory Visit: Payer: Self-pay | Admitting: Internal Medicine

## 2016-10-23 ENCOUNTER — Ambulatory Visit (INDEPENDENT_AMBULATORY_CARE_PROVIDER_SITE_OTHER): Payer: Managed Care, Other (non HMO) | Admitting: Physician Assistant

## 2016-10-23 ENCOUNTER — Encounter: Payer: Self-pay | Admitting: Physician Assistant

## 2016-10-23 VITALS — BP 138/82 | HR 82 | Temp 97.5°F | Resp 16 | Ht 67.0 in | Wt 160.8 lb

## 2016-10-23 DIAGNOSIS — C240 Malignant neoplasm of extrahepatic bile duct: Secondary | ICD-10-CM

## 2016-10-23 DIAGNOSIS — I10 Essential (primary) hypertension: Secondary | ICD-10-CM | POA: Diagnosis not present

## 2016-10-23 DIAGNOSIS — Z79899 Other long term (current) drug therapy: Secondary | ICD-10-CM | POA: Diagnosis not present

## 2016-10-23 DIAGNOSIS — N182 Chronic kidney disease, stage 2 (mild): Secondary | ICD-10-CM | POA: Diagnosis not present

## 2016-10-23 DIAGNOSIS — E559 Vitamin D deficiency, unspecified: Secondary | ICD-10-CM | POA: Diagnosis not present

## 2016-10-23 DIAGNOSIS — M2550 Pain in unspecified joint: Secondary | ICD-10-CM

## 2016-10-23 DIAGNOSIS — E1122 Type 2 diabetes mellitus with diabetic chronic kidney disease: Secondary | ICD-10-CM

## 2016-10-23 DIAGNOSIS — E782 Mixed hyperlipidemia: Secondary | ICD-10-CM

## 2016-10-23 DIAGNOSIS — H04129 Dry eye syndrome of unspecified lacrimal gland: Secondary | ICD-10-CM | POA: Diagnosis not present

## 2016-10-23 LAB — CBC WITH DIFFERENTIAL/PLATELET
BASOS PCT: 0 %
Basophils Absolute: 0 cells/uL (ref 0–200)
EOS PCT: 2 %
Eosinophils Absolute: 176 cells/uL (ref 15–500)
HCT: 42.3 % (ref 35.0–45.0)
Hemoglobin: 14.1 g/dL (ref 11.7–15.5)
LYMPHS PCT: 21 %
Lymphs Abs: 1848 cells/uL (ref 850–3900)
MCH: 29 pg (ref 27.0–33.0)
MCHC: 33.3 g/dL (ref 32.0–36.0)
MCV: 86.9 fL (ref 80.0–100.0)
MONOS PCT: 8 %
MPV: 9.8 fL (ref 7.5–12.5)
Monocytes Absolute: 704 cells/uL (ref 200–950)
Neutro Abs: 6072 cells/uL (ref 1500–7800)
Neutrophils Relative %: 69 %
PLATELETS: 304 10*3/uL (ref 140–400)
RBC: 4.87 MIL/uL (ref 3.80–5.10)
RDW: 14.2 % (ref 11.0–15.0)
WBC: 8.8 10*3/uL (ref 3.8–10.8)

## 2016-10-23 NOTE — Progress Notes (Signed)
Assessment and Plan:   Hypertension -Continue medication, monitor blood pressure at home. Continue DASH diet.  Reminder to go to the ER if any CP, SOB, nausea, dizziness, severe HA, changes vision/speech, left arm numbness and tingling and jaw pain.  Cholesterol -Continue diet and exercise. Check cholesterol.   Diabetes with diabetic chronic kidney disease -Continue diet and exercise. Check A1C - discussed insulin today, being s/p whipple with pancreatic insufficiency there is a good possibility that she has become insulin dependent, discussed with patient, will check A1C this time if elevated will bring in for DM visit to add on insulin and stop oral meds.   Vitamin D Def - check level and continue medications.   Hypothyroidism -check TSH level, continue medications the same, reminded to take on an empty stomach 30-74mns before food.   Malignant neoplasm of extrahepatic bile duct ([place] Continue to follow up with [facility]  Arthralgia, unspecified joint Check labs, ? Need to add cymbalta /NSAIDS/refer to ortho -     Sedimentation rate -     ANA -     Anti-DNA antibody, double-stranded -     Rheumatoid factor -     Cyclic citrul peptide antibody, IgG -     Sjogrens syndrome-A extractable nuclear antibody -     Sjogrens syndrome-B extractable nuclear antibody  Dry eyes -     Sedimentation rate -     ANA -     Anti-DNA antibody, double-stranded -     Rheumatoid factor -     Cyclic citrul peptide antibody, IgG -     Sjogrens syndrome-A extractable nuclear antibody -     Sjogrens syndrome-B extractable nuclear antibody     Continue diet and meds as discussed. Further disposition pending results of labs. Discussed med's effects and SE's.   Over 30 minutes of exam, counseling, chart review, and critical decision making was performed   HPI 60y.o. female  presents for 3 month follow up on hypertension, cholesterol, diabetes and vitamin D deficiency.   Her blood pressure has been  controlled at home, today her BP is BP: 138/82. She woke up Monday and her left arm was hurting her, she has some rash on her left arm. Joint of 3rd right hand hurts. She fell at work and hurt her tail bone, and right wrist was broken from fall, going to see ortho.  Patient also has history of klatkin cholangiocarcinoma s/p whipple in 2009. She is following with [facility] for FUO. Saw oncologist, had MRI and will have repeat in Dec, possible reoccourance. She has an appointment with Dr. KDeatra Inaon the 24th and is on creon for pancreatic insufficiency  She does not workout. She denies chest pain, shortness of breath, dizziness. She is on zaroxyolyn and lasix for edema. She had a normal stress echo 04/16/2016 with Dr. JMartinique   She is on cholesterol medication and denies myalgias. Her cholesterol is at goal. The cholesterol was:   Lab Results  Component Value Date   CHOL 127 07/31/2016   HDL 41 (L) 07/31/2016   LDLCALC 65 07/31/2016   TRIG 107 07/31/2016   CHOLHDL 3.1 07/31/2016    She has been working on diet and exercise for diabetes with diabetic chronic kidney disease, she is on bASA, she is not on ACE/ARB, she is on glipizide 5 mg TID, metformin, januvia, she has been taking her medications regularly and denies  paresthesia of the feet, polydipsia, polyuria and visual disturbances. Last A1C was:  Lab Results  Component Value Date   HGBA1C 8.4 (H) 07/31/2016   Lab Results  Component Value Date   CREATININE 1.05 (H) 07/31/2016   BUN 14 07/31/2016   NA 139 07/31/2016   K 4.4 07/31/2016   CL 101 07/31/2016   CO2 25 07/31/2016    Patient is on Vitamin D supplement. Lab Results  Component Value Date   VD25OH 90 07/31/2016   She is on thyroid medication, she is on '100mg'$  one day and '200mg'$  the other day. Her medication was not changed last visit.   Lab Results  Component Value Date   TSH 3.63 07/31/2016  .  She is still having pain from right wrist fracture, and having pain worse at night,  has bilateral hip pain, bilateral shoulder pain, leg pain at night, sister recently diagnosed with sjogrens.  Best friend, Tomie China, passed Monday, she is doing funeral arrangements and mother had a stroke.   Lab Results  Component Value Date   GFRNONAA 57 (L) 07/31/2016    BMI is Body mass index is 25.18 kg/m., she is working on diet and exercise. Wt Readings from Last 3 Encounters:  10/23/16 160 lb 12.8 oz (72.9 kg)  08/14/16 160 lb 3.2 oz (72.7 kg)  07/31/16 163 lb (73.9 kg)     Current Medications:  Current Outpatient Prescriptions on File Prior to Visit  Medication Sig Dispense Refill  . albuterol (VENTOLIN HFA) 108 (90 BASE) MCG/ACT inhaler Inhale 2 puffs into the lungs every 4 (four) hours as needed for wheezing or shortness of breath. 1 Inhaler 4  . ALPRAZolam (XANAX) 1 MG tablet TAKE 1/2 TO 1 TABLET BY MOUTH 3 TIMES A DAY AS NEEDED 270 tablet 1  . amitriptyline (ELAVIL) 25 MG tablet TAKE 1/2 - 2 TABLETS AT NIGHT FOR LEG PAIN 180 tablet 1  . azelastine (ASTELIN) 0.1 % nasal spray PLACE 2 SPRAYS INTO BOTH NOSTRILS 2 (TWO) TIMES DAILY. USE IN EACH NOSTRIL AS DIRECTED 90 mL 1  . Blood Glucose Monitoring Suppl (ONE TOUCH ULTRA SYSTEM KIT) w/Device KIT 1 kit by Does not apply route once. 1 each 0  . calcium citrate-vitamin D (CITRACAL+D) 315-200 MG-UNIT per tablet Take 1 tablet by mouth 3 (three) times daily.    . cetirizine (ZYRTEC) 10 MG tablet Take 10 mg by mouth every morning.      . ferrous sulfate 325 (65 FE) MG tablet Take 325 mg by mouth daily with breakfast.    . fluticasone (FLONASE) 50 MCG/ACT nasal spray PLACE 2 SPRAYS INTO BOTH NOSTRILS DAILY. 16 g 1  . furosemide (LASIX) 40 MG tablet Take 1 to 2 tablets 2 x day for fluid & swelling 360 tablet 1  . glipiZIDE (GLUCOTROL) 5 MG tablet Take 1/2 to 1 tab 3 x day if needed for elevated glucose 270 tablet 1  . glucose blood (ONE TOUCH TEST STRIPS) test strip CHECK BLOOD SUGAR 3 TIMES DAILY DUE TO POORLY CONTROLLED  GLUCOSE-DX-E11.65 100 each 12  . KLOR-CON M20 20 MEQ tablet TAKE 4 TABLETS BY MOUTH FOUR TIMES A DAY AS DIRECTED 390 tablet 4  . Lancets (ONETOUCH ULTRASOFT) lancets Check blood sugar 3 times daily due to poorly controlled glucose-DX-E11.65. 100 each 12  . levothyroxine (SYNTHROID, LEVOTHROID) 200 MCG tablet TAKE 1/2 TO - 1 TABLETS BY MOUTH DAILY BEFORE BREAKFAST. ALTERNATE TAKING 100 AND '200MG'$  90 tablet 1  . lipase/protease/amylase (CREON) 12000 units CPEP capsule TAKE 1 CAPSULE BY MOUTH 3 TIMES DAILY. 270 capsule 1  . metFORMIN (GLUCOPHAGE)  500 MG tablet TAKE 1-2 TABLETS BY MOUTH 3 TIMES DAILY. TAKE 1 TAB AT BREAKFAST, 1 AT LUNCH AND 2 AT DINNER 360 tablet 1  . metolazone (ZAROXOLYN) 2.5 MG tablet TAKE 1 TABLET (2.5 MG TOTAL) BY MOUTH DAILY. 90 tablet 1  . Multiple Vitamins-Minerals (MULTIVITAMIN WITH MINERALS) tablet Take 1 tablet by mouth daily.     . sitaGLIPtin (JANUVIA) 100 MG tablet Take 1 tablet (100 mg total) by mouth daily. 90 tablet 1  . sucralfate (CARAFATE) 1 g tablet TAKE 1 TABLET BY MOUTH 4 TIMES DAILY. 360 tablet 1  . Vitamin D, Ergocalciferol, (DRISDOL) 50000 units CAPS capsule TAKE 1 CAPSULE (50,000 UNITS TOTAL) BY MOUTH DAILY. 30 capsule 1   No current facility-administered medications on file prior to visit.    Medical History:  Past Medical History:  Diagnosis Date  . Anemia    History of blood transfusion which is when pt reports being exposed to hepatitis in 2009.  Marland Kitchen Anxiety   . Asthma   . Cancer ([place])    Klatkin bile duct ca (2009)   . CKD (chronic kidney disease), stage II   . Diabetes mellitus   . GERD (gastroesophageal reflux disease)   . Hepatitis   . Hyperlipidemia   . Hypertension   . Hypothyroid    Allergies:  Allergies  Allergen Reactions  . Ace Inhibitors Anaphylaxis  . Tilade [Nedocromil] Anaphylaxis  . Reglan [Metoclopramide] Nausea And Vomiting  . Betadine [Povidone Iodine] Rash    iching      Review of Systems:  Review of Systems   Constitutional: Negative for chills, diaphoresis, fever, malaise/fatigue and weight loss.  HENT: Negative.   Respiratory: Negative.   Cardiovascular: Negative for chest pain, palpitations, orthopnea, claudication, leg swelling and PND.  Gastrointestinal: Negative for abdominal pain, blood in stool, constipation, diarrhea, heartburn, melena, nausea and vomiting.  Genitourinary: Negative.   Musculoskeletal: Positive for joint pain and myalgias. Negative for falls.  Skin: Negative for rash.  Neurological: Negative.  Negative for weakness.  Psychiatric/Behavioral: Positive for depression. Negative for hallucinations, memory loss, substance abuse and suicidal ideas. The patient is not nervous/anxious and does not have insomnia.     Family history- Review and unchanged Social history- Review and unchanged Physical Exam: BP 138/82   Pulse 82   Temp 97.5 F (36.4 C)   Resp 16   Ht '5\' 7"'$  (1.702 m)   Wt 160 lb 12.8 oz (72.9 kg)   SpO2 98%   BMI 25.18 kg/m  Wt Readings from Last 3 Encounters:  10/23/16 160 lb 12.8 oz (72.9 kg)  08/14/16 160 lb 3.2 oz (72.7 kg)  07/31/16 163 lb (73.9 kg)   General Appearance: Well nourished, in no apparent distress. Eyes: PERRLA, EOMs, conjunctiva no swelling or erythema Sinuses: No Frontal/maxillary tenderness ENT/Mouth: Ext aud canals clear, TMs without erythema, bulging. No erythema, swelling, or exudate on post pharynx.  Tonsils not swollen or erythematous. Hearing normal.  Neck: Supple, thyroid normal.  Respiratory: Respiratory effort normal, BS equal bilaterally without rales, rhonchi, wheezing or stridor.  Cardio: RRR with no MRGs. Brisk peripheral pulses without edema.  Abdomen: Soft, + BS.  Non tender, no guarding, rebound, hernias, masses. Lymphatics: Non tender without lymphadenopathy.  Musculoskeletal: Full ROM, 5/5 strength, Normal gait.  Skin: Warm, dry without rashes, lesions, ecchymosis.  Neuro: Cranial nerves intact. No cerebellar  symptoms.  Psych: Awake and oriented X 3, normal affect, Insight and Judgment appropriate.    Vicie Mutters, PA-C 3:48 PM [facility]  Adult & Adolescent Internal Medicine

## 2016-10-23 NOTE — Patient Instructions (Addendum)
Tumeric with black pepper extract is a great natural antiinflammatory that helps with arthritis and aches and pain. Can get from [employer] or any health food store. Need to take at least 800mg  twice a day with food.   Look up cymbalta If labs are negative may try this for pain versus sending to ortho for injections     Bad carbs also include fruit juice, alcohol, and sweet tea. These are empty calories that do not signal to your brain that you are full.   Please remember the good carbs are still carbs which convert into sugar. So please measure them out no more than 1/2-1 cup of rice, oatmeal, pasta, and beans  Veggies are however free foods! Pile them on.   Not all fruit is created equal. Please see the list below, the fruit at the bottom is higher in sugars than the fruit at the top. Please avoid all dried fruits.

## 2016-10-24 LAB — HEPATIC FUNCTION PANEL
ALBUMIN: 4.3 g/dL (ref 3.6–5.1)
ALK PHOS: 159 U/L — AB (ref 33–130)
ALT: 24 U/L (ref 6–29)
AST: 34 U/L (ref 10–35)
BILIRUBIN INDIRECT: 0.3 mg/dL (ref 0.2–1.2)
BILIRUBIN TOTAL: 0.4 mg/dL (ref 0.2–1.2)
Bilirubin, Direct: 0.1 mg/dL (ref <=0.2)
Total Protein: 7.4 g/dL (ref 6.1–8.1)

## 2016-10-24 LAB — LIPID PANEL
CHOLESTEROL: 143 mg/dL (ref <200)
HDL: 50 mg/dL — AB (ref >50)
LDL CALC: 55 mg/dL (ref <100)
TRIGLYCERIDES: 190 mg/dL — AB (ref <150)
Total CHOL/HDL Ratio: 2.9 Ratio (ref <5.0)
VLDL: 38 mg/dL — ABNORMAL HIGH (ref <30)

## 2016-10-24 LAB — RHEUMATOID FACTOR

## 2016-10-24 LAB — BASIC METABOLIC PANEL WITH GFR
BUN: 14 mg/dL (ref 7–25)
CALCIUM: 9.7 mg/dL (ref 8.6–10.4)
CO2: 23 mmol/L (ref 20–31)
Chloride: 98 mmol/L (ref 98–110)
Creat: 1.23 mg/dL — ABNORMAL HIGH (ref 0.50–0.99)
GFR, EST AFRICAN AMERICAN: 55 mL/min — AB (ref >=60)
GFR, EST NON AFRICAN AMERICAN: 47 mL/min — AB (ref >=60)
Glucose, Bld: 199 mg/dL — ABNORMAL HIGH (ref 65–99)
POTASSIUM: 3.3 mmol/L — AB (ref 3.5–5.3)
Sodium: 139 mmol/L (ref 135–146)

## 2016-10-24 LAB — HEMOGLOBIN A1C
Hgb A1c MFr Bld: 8.3 % — ABNORMAL HIGH (ref <5.7)
Mean Plasma Glucose: 192 mg/dL

## 2016-10-24 LAB — ANA: ANA: NEGATIVE

## 2016-10-24 LAB — SEDIMENTATION RATE: Sed Rate: 12 mm/hr (ref 0–30)

## 2016-10-24 LAB — CYCLIC CITRUL PEPTIDE ANTIBODY, IGG: Cyclic Citrullin Peptide Ab: <16 Units

## 2016-10-24 LAB — ANTI-DNA ANTIBODY, DOUBLE-STRANDED

## 2016-10-24 LAB — SJOGRENS SYNDROME-A EXTRACTABLE NUCLEAR ANTIBODY: SSA (Ro) (ENA) Antibody, IgG: <1

## 2016-10-24 LAB — MAGNESIUM: Magnesium: 2.2 mg/dL (ref 1.5–2.5)

## 2016-10-24 LAB — TSH: TSH: 5.18 mIU/L — ABNORMAL HIGH

## 2016-10-24 LAB — VITAMIN D 25 HYDROXY (VIT D DEFICIENCY, FRACTURES): VIT D 25 HYDROXY: 119 ng/mL — AB (ref 30–100)

## 2016-10-24 LAB — SJOGRENS SYNDROME-B EXTRACTABLE NUCLEAR ANTIBODY: SSB (LA) (ENA) ANTIBODY, IGG: NEGATIVE

## 2016-11-05 ENCOUNTER — Ambulatory Visit (INDEPENDENT_AMBULATORY_CARE_PROVIDER_SITE_OTHER): Payer: Managed Care, Other (non HMO)

## 2016-11-05 ENCOUNTER — Other Ambulatory Visit: Payer: Self-pay | Admitting: Internal Medicine

## 2016-11-05 DIAGNOSIS — R8271 Bacteriuria: Secondary | ICD-10-CM | POA: Diagnosis not present

## 2016-11-05 MED ORDER — CIPROFLOXACIN HCL 250 MG PO TABS: 250.0000 mg | ORAL_TABLET | Freq: Two times a day (BID) | ORAL | 0 refills | Status: DC

## 2016-11-05 NOTE — Progress Notes (Signed)
Pt presents for [facility] lab for poss UTI Pt left a urine samples for U/A & culture.

## 2016-11-06 ENCOUNTER — Other Ambulatory Visit: Payer: Self-pay | Admitting: *Deleted

## 2016-11-06 LAB — URINALYSIS, ROUTINE W REFLEX MICROSCOPIC
BILIRUBIN URINE: NEGATIVE
HGB URINE DIPSTICK: NEGATIVE
KETONES UR: NEGATIVE
Nitrite: NEGATIVE
PROTEIN: NEGATIVE
Specific Gravity, Urine: 1.01 (ref 1.001–1.035)
pH: 6 (ref 5.0–8.0)

## 2016-11-06 LAB — URINALYSIS, MICROSCOPIC ONLY
Bacteria, UA: NONE SEEN [HPF]
Casts: NONE SEEN [LPF]
Crystals: NONE SEEN [HPF]
RBC / HPF: NONE SEEN RBC/HPF (ref <=2)
Squamous Epithelial / LPF: NONE SEEN [HPF] (ref <=5)
YEAST: NONE SEEN [HPF]

## 2016-11-06 MED ORDER — AMITRIPTYLINE HCL 25 MG PO TABS: ORAL_TABLET | ORAL | 1 refills | Status: DC

## 2016-11-07 ENCOUNTER — Ambulatory Visit: Payer: Self-pay | Admitting: Physician Assistant

## 2016-11-08 LAB — URINE CULTURE

## 2016-11-11 ENCOUNTER — Encounter (HOSPITAL_COMMUNITY): Payer: Self-pay | Admitting: Emergency Medicine

## 2016-11-11 ENCOUNTER — Inpatient Hospital Stay (HOSPITAL_COMMUNITY)
Admission: EM | Admit: 2016-11-11 | Discharge: 2016-11-14 | DRG: 281 | Disposition: A | Discharge status: Home / Self Care | Payer: Managed Care, Other (non HMO) | Attending: Family Medicine | Admitting: Family Medicine

## 2016-11-11 ENCOUNTER — Emergency Department (HOSPITAL_COMMUNITY): Payer: Managed Care, Other (non HMO)

## 2016-11-11 DIAGNOSIS — Z79899 Other long term (current) drug therapy: Secondary | ICD-10-CM

## 2016-11-11 DIAGNOSIS — N183 Chronic kidney disease, stage 3 unspecified: Secondary | ICD-10-CM

## 2016-11-11 DIAGNOSIS — Z7984 Long term (current) use of oral hypoglycemic drugs: Secondary | ICD-10-CM

## 2016-11-11 DIAGNOSIS — E782 Mixed hyperlipidemia: Secondary | ICD-10-CM | POA: Diagnosis present

## 2016-11-11 DIAGNOSIS — N390 Urinary tract infection, site not specified: Secondary | ICD-10-CM | POA: Diagnosis present

## 2016-11-11 DIAGNOSIS — Z9049 Acquired absence of other specified parts of digestive tract: Secondary | ICD-10-CM

## 2016-11-11 DIAGNOSIS — I493 Ventricular premature depolarization: Secondary | ICD-10-CM

## 2016-11-11 DIAGNOSIS — E1129 Type 2 diabetes mellitus with other diabetic kidney complication: Secondary | ICD-10-CM | POA: Diagnosis present

## 2016-11-11 DIAGNOSIS — Z90411 Acquired partial absence of pancreas: Secondary | ICD-10-CM

## 2016-11-11 DIAGNOSIS — I2510 Atherosclerotic heart disease of native coronary artery without angina pectoris: Secondary | ICD-10-CM

## 2016-11-11 DIAGNOSIS — E039 Hypothyroidism, unspecified: Secondary | ICD-10-CM | POA: Diagnosis present

## 2016-11-11 DIAGNOSIS — E1121 Type 2 diabetes mellitus with diabetic nephropathy: Secondary | ICD-10-CM | POA: Diagnosis present

## 2016-11-11 DIAGNOSIS — Z825 Family history of asthma and other chronic lower respiratory diseases: Secondary | ICD-10-CM

## 2016-11-11 DIAGNOSIS — Z8249 Family history of ischemic heart disease and other diseases of the circulatory system: Secondary | ICD-10-CM

## 2016-11-11 DIAGNOSIS — Z7982 Long term (current) use of aspirin: Secondary | ICD-10-CM

## 2016-11-11 DIAGNOSIS — I472 Ventricular tachycardia: Secondary | ICD-10-CM | POA: Diagnosis not present

## 2016-11-11 DIAGNOSIS — Z90710 Acquired absence of both cervix and uterus: Secondary | ICD-10-CM

## 2016-11-11 DIAGNOSIS — K219 Gastro-esophageal reflux disease without esophagitis: Secondary | ICD-10-CM | POA: Diagnosis present

## 2016-11-11 DIAGNOSIS — Z820 Family history of epilepsy and other diseases of the nervous system: Secondary | ICD-10-CM

## 2016-11-11 DIAGNOSIS — C240 Malignant neoplasm of extrahepatic bile duct: Secondary | ICD-10-CM | POA: Diagnosis present

## 2016-11-11 DIAGNOSIS — Z87891 Personal history of nicotine dependence: Secondary | ICD-10-CM

## 2016-11-11 DIAGNOSIS — Z800 Family history of malignant neoplasm of digestive organs: Secondary | ICD-10-CM

## 2016-11-11 DIAGNOSIS — E1122 Type 2 diabetes mellitus with diabetic chronic kidney disease: Secondary | ICD-10-CM | POA: Diagnosis present

## 2016-11-11 DIAGNOSIS — I129 Hypertensive chronic kidney disease with stage 1 through stage 4 chronic kidney disease, or unspecified chronic kidney disease: Secondary | ICD-10-CM | POA: Diagnosis present

## 2016-11-11 DIAGNOSIS — I214 Non-ST elevation (NSTEMI) myocardial infarction: Secondary | ICD-10-CM | POA: Diagnosis not present

## 2016-11-11 DIAGNOSIS — Z8509 Personal history of malignant neoplasm of other digestive organs: Secondary | ICD-10-CM

## 2016-11-11 DIAGNOSIS — I10 Essential (primary) hypertension: Secondary | ICD-10-CM | POA: Diagnosis present

## 2016-11-11 DIAGNOSIS — F419 Anxiety disorder, unspecified: Secondary | ICD-10-CM | POA: Diagnosis present

## 2016-11-11 DIAGNOSIS — Z9884 Bariatric surgery status: Secondary | ICD-10-CM

## 2016-11-11 DIAGNOSIS — R079 Chest pain, unspecified: Secondary | ICD-10-CM | POA: Diagnosis present

## 2016-11-11 DIAGNOSIS — E785 Hyperlipidemia, unspecified: Secondary | ICD-10-CM | POA: Diagnosis present

## 2016-11-11 DIAGNOSIS — Z7951 Long term (current) use of inhaled steroids: Secondary | ICD-10-CM

## 2016-11-11 DIAGNOSIS — E876 Hypokalemia: Secondary | ICD-10-CM | POA: Diagnosis present

## 2016-11-11 DIAGNOSIS — Z833 Family history of diabetes mellitus: Secondary | ICD-10-CM

## 2016-11-11 HISTORY — DX: Bariatric surgery status: Z98.84

## 2016-11-11 HISTORY — DX: Ventricular premature depolarization: I49.3

## 2016-11-11 HISTORY — DX: Chronic kidney disease, stage 3 unspecified: N18.30

## 2016-11-11 HISTORY — DX: Atherosclerotic heart disease of native coronary artery without angina pectoris: I25.10

## 2016-11-11 HISTORY — DX: Chronic kidney disease, stage 3 (moderate): N18.3

## 2016-11-11 LAB — I-STAT TROPONIN, ED
TROPONIN I, POC: 0.01 ng/mL (ref 0.00–0.08)
TROPONIN I, POC: 0.12 ng/mL — AB (ref 0.00–0.08)

## 2016-11-11 LAB — HEPATIC FUNCTION PANEL
ALT: 26 U/L (ref 14–54)
AST: 30 U/L (ref 15–41)
Albumin: 4.7 g/dL (ref 3.5–5.0)
Alkaline Phosphatase: 122 U/L (ref 38–126)
Bilirubin, Direct: 0.1 mg/dL (ref 0.1–0.5)
Indirect Bilirubin: 0.8 mg/dL (ref 0.3–0.9)
TOTAL PROTEIN: 7.8 g/dL (ref 6.5–8.1)
Total Bilirubin: 0.9 mg/dL (ref 0.3–1.2)

## 2016-11-11 LAB — BASIC METABOLIC PANEL
ANION GAP: 14 (ref 5–15)
BUN: 16 mg/dL (ref 6–20)
CALCIUM: 9.9 mg/dL (ref 8.9–10.3)
CO2: 29 mmol/L (ref 22–32)
CREATININE: 1.23 mg/dL — AB (ref 0.44–1.00)
Chloride: 93 mmol/L — ABNORMAL LOW (ref 101–111)
GFR calc non Af Amer: 46 mL/min — ABNORMAL LOW (ref >60)
GFR, EST AFRICAN AMERICAN: 54 mL/min — AB (ref >60)
Glucose, Bld: 283 mg/dL — ABNORMAL HIGH (ref 65–99)
Potassium: 2.8 mmol/L — ABNORMAL LOW (ref 3.5–5.1)
SODIUM: 136 mmol/L (ref 135–145)

## 2016-11-11 LAB — LIPASE, BLOOD: LIPASE: 33 U/L (ref 11–51)

## 2016-11-11 LAB — CBC
HCT: 43 % (ref 36.0–46.0)
HEMOGLOBIN: 14.7 g/dL (ref 12.0–15.0)
MCH: 29.3 pg (ref 26.0–34.0)
MCHC: 34.2 g/dL (ref 30.0–36.0)
MCV: 85.7 fL (ref 78.0–100.0)
PLATELETS: 237 10*3/uL (ref 150–400)
RBC: 5.02 MIL/uL (ref 3.87–5.11)
RDW: 13.4 % (ref 11.5–15.5)
WBC: 13.5 10*3/uL — AB (ref 4.0–10.5)

## 2016-11-11 MED ORDER — HEPARIN BOLUS VIA INFUSION
3000.0000 [IU] | Freq: Once | INTRAVENOUS | Status: AC
  Administered 2016-11-12: 3000 [IU] via INTRAVENOUS
  Filled 2016-11-11: qty 3000

## 2016-11-11 MED ORDER — OXYCODONE-ACETAMINOPHEN 5-325 MG PO TABS
1.0000 | ORAL_TABLET | Freq: Once | ORAL | Status: AC
  Administered 2016-11-11: 1 via ORAL
  Filled 2016-11-11: qty 1

## 2016-11-11 MED ORDER — GI COCKTAIL ~~LOC~~
30.0000 mL | Freq: Once | ORAL | Status: AC
  Administered 2016-11-11: 30 mL via ORAL
  Filled 2016-11-11: qty 30

## 2016-11-11 MED ORDER — POTASSIUM CHLORIDE CRYS ER 20 MEQ PO TBCR
40.0000 meq | EXTENDED_RELEASE_TABLET | Freq: Once | ORAL | Status: AC
  Administered 2016-11-11: 40 meq via ORAL
  Filled 2016-11-11: qty 2

## 2016-11-11 MED ORDER — ASPIRIN 81 MG PO CHEW
324.0000 mg | CHEWABLE_TABLET | Freq: Once | ORAL | Status: AC
  Administered 2016-11-11: 324 mg via ORAL
  Filled 2016-11-11: qty 4

## 2016-11-11 MED ORDER — HEPARIN (PORCINE) IN NACL 100-0.45 UNIT/ML-% IJ SOLN
850.0000 [IU]/h | INTRAMUSCULAR | Status: DC
  Administered 2016-11-12: 850 [IU]/h via INTRAVENOUS
  Filled 2016-11-11: qty 250

## 2016-11-11 NOTE — ED Triage Notes (Signed)
Pt reports CP, SOB, dizziness, and lightheadedness since 1600 today. Also reports abd and flank pain. Diagnosed with e coli earlier in the month, but has not taken abx today.

## 2016-11-11 NOTE — ED Notes (Signed)
Writer notified EDP Belfi of abnormal I-static trop result

## 2016-11-11 NOTE — Progress Notes (Signed)
     Called by [facility] ER regarding abnormal troponin.    60 y/o patient with DM2, HTN and pancreatic CA s/p Whipple. Presents with abdominal and CP. Pain resolved with GI cocktail. Per ER MD CP is atypical. ECG with mild ST scooping.   Initial POC trop 0.01. F/u POC trop 0.12. Patient now asymptomatic. Had stress echo 5/17 with Dr. Martinique which was normal.   Patient being admitted to [facility].  Recommend cycling lab troponins. Start heparin for now. If troponins continue to climb (or recurrent CP) we will see in consult and she will need cath. If troponin flat or decreasing can f/u as outpatient.   Conlee Sliter,MD 11:35 PM

## 2016-11-11 NOTE — H&P (Signed)
History and Physical    Courtney Stevens KYH:[id number] DOB: January 30, 1956 DOA: 11/11/2016  PCP: [name], MD Consultants:  [place] [facility]; Leslie Andrea - oncology at [facility] Patient coming from: home - lives with husband; NOK: husband - [phone]  Chief Complaint: chest Stevens  HPI: Courtney Stevens is a 60 y.o. female with medical history significant of HTN, HLD, DM, CKD, hypothyroidism presenting with acute onset of chest Stevens.  She reports that she felt well this AM.  About 4pm, she was fixing dinner and started feeling bad and having chest Stevens.  Severe nausea.  Sent her husband, son, grandsons to her mother's house while she laid down.  Took ASA.  Still felt very nauseated.  She called her husband about 6 and asked him to come home and take her to the hospital. Currently Stevens-free.  Left-sided and mid-epigastric Stevens when present.  She was stirring, cracking eggs, actively cooking when the Stevens started.  Stevens resolved with Percocet.  Also had severe upper back Stevens by shoulders and in the low back too.  Stress echo 5/3-/17 - normal.  2001 - gastric bypass.  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  Thought it was maybe related to this.   E. Coli UTI on 12/19, treated with Cipro (still taking).  ED Course:  Per Dr. Tamera Punt: Patient presents with chest Stevens. It's atypical and somewhat reproducible. Her abdominal Stevens resolved after GI cocktail but she still had some Stevens to her mid chest. She also complained of Stevens in her back and shoulder which seemed to be worse with movement. She was given a Percocet for this. Currently she is Stevens-free. She does have a mildly elevated second troponin. Given this as well as her prior risk factors, I will consult the hospitalist for admission. Her chest x-ray is clear. There is no signs of pulmonary edema or pneumonia. Her EKG doesn't show any acute ischemia.  I spoke with Dr. Lorin Mercy who requested cardiology consult. I spoke with Dr. Sung Amabile who  advises to cycle troponins and if the troponin continues to rise, consult cardiology for possible intervention. Patient was started on heparin per his recommendation. He does say that pt can stay at [facility] unless pt's condition changes.   Review of Systems: As per HPI; otherwise 10 point review of systems reviewed and negative.   Ambulatory Status: ambulates without assistance  Past Medical History:  Diagnosis Date  . Anemia    History of blood transfusion which is when pt reports being exposed to hepatitis in 2009.  Marland Kitchen Anxiety   . Asthma   . Cancer ([place])    [place] bile duct ca (2009)   . CKD (chronic kidney disease), stage II   . Diabetes mellitus   . GERD (gastroesophageal reflux disease)   . Hepatitis   . Hyperlipidemia   . Hypertension   . Hypothyroid     Past Surgical History:  Procedure Laterality Date  . ABDOMINAL HYSTERECTOMY  1990   BIL OOPHORECTOMY  . APPENDECTOMY    . Bile Duct Resection  2009  . BREAST SURGERY     BIOPSY NEG  . BUNIONECTOMY Right 2005  . CHOLECYSTECTOMY  1992  . GASTRIC BYPASS  2004  . TONSILLECTOMY    . TUBAL LIGATION    . VIDEO BRONCHOSCOPY WITH ENDOBRONCHIAL ULTRASOUND N/A 10/12/2014   Procedure: VIDEO BRONCHOSCOPY WITH ENDOBRONCHIAL ULTRASOUND;  Surgeon: Brand Males, MD;  Location: [place];  Service: Thoracic;  Laterality: N/A;    Social History  Social History  . Marital status: Married    Spouse name: N/A  . Number of children: 2  . Years of education: N/A   Occupational History  . [employer] Manager [employer]   Social History Main Topics  . Smoking status: Former Smoker    Packs/day: 2.00    Years: 5.00    Types: Cigarettes    Quit date: 11/18/1977  . Smokeless tobacco: Never Used  . Alcohol use Yes     Comment: 2-3 glasses of wine once per month   . Drug use: No  . Sexual activity: No   Other Topics Concern  . Not on file   Social History Narrative  . No narrative on file    Allergies  Allergen  Reactions  . Ace Inhibitors Anaphylaxis  . Tilade [Nedocromil] Anaphylaxis  . Reglan [Metoclopramide] Nausea And Vomiting  . Betadine [Povidone Iodine] Rash    iching     Family History  Problem Relation Age of Onset  . Colon cancer Mother   . Hypertension Mother   . Glaucoma Mother   . Diabetes Father   . Alzheimer's disease Father   . Heart attack Father 47  . Nephrolithiasis Father   . Emphysema Father     was a smoker  . Heart disease Father   . Nephrolithiasis Sister   . Heart attack Brother 77  . Nephrolithiasis Brother     Prior to Admission medications   Medication Sig Start Date End Date Taking? Authorizing Provider  albuterol (VENTOLIN HFA) 108 (90 BASE) MCG/ACT inhaler Inhale 2 puffs into the lungs every 4 (four) hours as needed for wheezing or shortness of breath. 09/08/15  Yes Vicie Mutters, PA-C  ALPRAZolam (XANAX) 1 MG tablet TAKE 1/2 TO 1 TABLET BY MOUTH 3 TIMES A DAY AS NEEDED Patient taking differently: Take 0.5-1 mg by mouth 3 (three) times daily as needed for anxiety.  07/02/16  Yes Unk Pinto, MD  amitriptyline (ELAVIL) 25 MG tablet TAKE 1/2 - 2 TABLETS AT NIGHT FOR LEG Stevens 11/06/16  Yes Unk Pinto, MD  azelastine (ASTELIN) 0.1 % nasal spray PLACE 2 SPRAYS INTO BOTH NOSTRILS 2 (TWO) TIMES DAILY. USE IN [facility] NOSTRIL AS DIRECTED 06/29/16  Yes Unk Pinto, MD  calcium citrate-vitamin D (CITRACAL+D) 315-200 MG-UNIT per tablet Take 1 tablet by mouth 3 (three) times daily.   Yes Historical Provider, MD  cetirizine (ZYRTEC) 10 MG tablet Take 10 mg by mouth every morning.     Yes Historical Provider, MD  ciprofloxacin (CIPRO) 250 MG tablet Take 1 tablet (250 mg total) by mouth 2 (two) times daily. Take 1 tablet 2 x day with food for infection 11/05/16  Yes Unk Pinto, MD  ferrous sulfate 325 (65 FE) MG tablet Take 325 mg by mouth daily with breakfast.   Yes Historical Provider, MD  fluticasone (FLONASE) 50 MCG/ACT nasal spray PLACE 2 SPRAYS INTO  BOTH NOSTRILS DAILY. 10/15/16  Yes Brand Males, MD  furosemide (LASIX) 40 MG tablet Take 1 to 2 tablets 2 x day for fluid & swelling 07/02/16 01/02/17 Yes Unk Pinto, MD  glipiZIDE (GLUCOTROL) 5 MG tablet Take 1/2 to 1 tab 3 x day if needed for elevated glucose 07/02/16  Yes Unk Pinto, MD  KLOR-CON M20 20 MEQ tablet TAKE 4 TABLETS BY MOUTH FOUR TIMES A DAY AS DIRECTED 03/23/16  Yes Courtney Forcucci, PA-C  levothyroxine (SYNTHROID, LEVOTHROID) 200 MCG tablet TAKE 1/2 TO - 1 TABLETS BY MOUTH DAILY BEFORE BREAKFAST. ALTERNATE  TAKING 100 AND 200MG Patient taking differently: Take 200 mcg by mouth daily before breakfast.  07/02/16  Yes Unk Pinto, MD  lipase/protease/amylase (CREON) 12000 units CPEP capsule TAKE 1 CAPSULE BY MOUTH 3 TIMES DAILY. 07/02/16  Yes Unk Pinto, MD  metFORMIN (GLUCOPHAGE) 500 MG tablet TAKE 1-2 TABLETS BY MOUTH 3 TIMES DAILY. TAKE 1 TAB AT BREAKFAST, 1 AT LUNCH AND 2 AT [facility] 07/02/16  Yes Unk Pinto, MD  metolazone (ZAROXOLYN) 2.5 MG tablet TAKE 1 TABLET (2.5 MG TOTAL) BY MOUTH DAILY. 03/17/16  Yes Unk Pinto, MD  Multiple Vitamins-Minerals (MULTIVITAMIN WITH MINERALS) tablet Take 1 tablet by mouth daily.    Yes Historical Provider, MD  OVER THE COUNTER MEDICATION Take 1 tablet by mouth daily.   Yes Historical Provider, MD  sitaGLIPtin (JANUVIA) 100 MG tablet Take 1 tablet (100 mg total) by mouth daily. 07/02/16  Yes Unk Pinto, MD  sucralfate (CARAFATE) 1 g tablet TAKE 1 TABLET BY MOUTH 4 TIMES DAILY. 07/02/16  Yes Unk Pinto, MD  Vitamin D, Ergocalciferol, (DRISDOL) 50000 units CAPS capsule TAKE 1 CAPSULE DAILY 11/06/16  Yes Unk Pinto, MD  Blood Glucose Monitoring Suppl (ONE TOUCH ULTRA SYSTEM KIT) w/Device KIT 1 kit by Does not apply route once. 01/08/16   Unk Pinto, MD  glucose blood (ONE TOUCH TEST STRIPS) test strip CHECK BLOOD SUGAR 3 TIMES DAILY DUE TO POORLY CONTROLLED GLUCOSE-DX-E11.65 01/08/16   Unk Pinto, MD    Lancets [facility] ULTRASOFT) lancets Check blood sugar 3 times daily due to poorly controlled glucose-DX-E11.65. 01/08/16   Unk Pinto, MD    Physical Exam: Vitals:   11/11/16 2226 11/11/16 2230 11/11/16 2300 11/12/16 0045  BP: 183/88 143/84 146/86 168/78  Pulse: (!) 57 61 (!) 57 (!) 48  Resp: _0 Temp:    98.1 F (36.7 C)  TempSrc:    Oral  SpO2: 96% 98% 97% 100%  Weight:    71.7 kg (158 lb 1.1 oz)  Height:    _1  (1.702 m)     General: Appears calm and comfortable and is NAD; quite somnolent Eyes:  PERRL, EOMI, normal lids, iris ENT:  grossly normal hearing, lips & tongue, mmm Neck:  no LAD, masses or thyromegaly Cardiovascular:  RRR, no m/r/g. No LE edema.  Respiratory:  CTA bilaterally, no w/r/r. Normal respiratory effort. Abdomen:  soft, ntnd, NABS Skin:  no rash or induration seen on limited exam Musculoskeletal:  grossly normal tone BUE/BLE, good ROM, no bony abnormality Psychiatric:  Blunted affect, speech fluent and appropriate, AOx3 Neurologic:  CN 2-12 grossly intact, moves all extremities in coordinated fashion, sensation intact  Labs on Admission: I have personally reviewed following labs and imaging studies  CBC:  Recent Labs Lab 11/11/16 1907  WBC 13.5*  HGB 14.7  HCT 43.0  MCV 85.7  PLT 470   Basic Metabolic Panel:  Recent Labs Lab 11/11/16 1907  NA 136  K 2.8*  CL 93*  CO2 29  GLUCOSE 283*  BUN 16  CREATININE 1.23*  CALCIUM 9.9   GFR: Estimated Creatinine Clearance: 46.7 mL/min (by C-G formula based on SCr of 1.23 mg/dL (H)). Liver Function Tests:  Recent Labs Lab 11/11/16 1907  AST 30  ALT 26  ALKPHOS 122  BILITOT 0.9  PROT 7.8  ALBUMIN 4.7    Recent Labs Lab 11/11/16 1907  LIPASE 33   No results for input(s): AMMONIA in the last 168 hours. Coagulation Profile: No results for input(s): INR, PROTIME in the  last 168 hours. Cardiac Enzymes: No results for input(s): CKTOTAL, CKMB, CKMBINDEX, TROPONINI in  the last 168 hours. BNP (last 3 results) No results for input(s): PROBNP in the last 8760 hours. HbA1C: No results for input(s): HGBA1C in the last 72 hours. CBG: No results for input(s): GLUCAP in the last 168 hours. Lipid Profile: No results for input(s): CHOL, HDL, LDLCALC, TRIG, CHOLHDL, LDLDIRECT in the last 72 hours. Thyroid Function Tests: No results for input(s): TSH, T4TOTAL, FREET4, T3FREE, THYROIDAB in the last 72 hours. Anemia Panel: No results for input(s): VITAMINB12, FOLATE, FERRITIN, TIBC, IRON, RETICCTPCT in the last 72 hours. Urine analysis:    Component Value Date/Time   COLORURINE YELLOW 11/05/2016 1138   APPEARANCEUR CLEAR 11/05/2016 1138   LABSPEC 1.010 11/05/2016 1138   LABSPEC 1.020 08/15/2009 1348   PHURINE 6.0 11/05/2016 1138   GLUCOSEU 2+ (A) 11/05/2016 1138   HGBUR NEGATIVE 11/05/2016 1138   BILIRUBINUR NEGATIVE 11/05/2016 1138   BILIRUBINUR Negative 08/15/2009 1348   KETONESUR NEGATIVE 11/05/2016 1138   PROTEINUR NEGATIVE 11/05/2016 1138   UROBILINOGEN 0.2 08/02/2014 1616   NITRITE NEGATIVE 11/05/2016 1138   LEUKOCYTESUR 1+ (A) 11/05/2016 1138   LEUKOCYTESUR Negative 08/15/2009 1348    Creatinine Clearance: Estimated Creatinine Clearance: 46.7 mL/min (by C-G formula based on SCr of 1.23 mg/dL (H)).  Sepsis Labs: _0 (procalcitonin:4,lacticidven:4) ) Recent Results (from the past 240 hour(s))  Urine culture     Status: None   Collection Time: 11/05/16 12:13 PM  Result Value Ref Range Status   Culture ESCHERICHIA COLI  Final    Comment: SOURCE: URINE&URINE   Colony Count >=100,000 COLONIES/ML  Final   Organism ID, Bacteria ESCHERICHIA COLI  Final      Susceptibility   Escherichia coli -  (no method available)    AMPICILLIN 8 Sensitive     AMOX/CLAVULANIC 4 Sensitive     AMPICILLIN/SULBACTAM <=2 Sensitive     PIP/TAZO <=4 Sensitive     IMIPENEM <=0.25 Sensitive     CEFAZOLIN <=4 Not Reportable     CEFTRIAXONE <=1 Sensitive      CEFTAZIDIME <=1 Sensitive     CEFEPIME <=1 Sensitive     GENTAMICIN <=1 Sensitive     TOBRAMYCIN <=1 Sensitive     CIPROFLOXACIN <=0.25 Sensitive     LEVOFLOXACIN <=0.12 Sensitive     NITROFURANTOIN <=16 Sensitive     TRIMETH/SULFA* <=20 Sensitive      * NR=NOT REPORTABLE,SEE COMMENTORAL therapy:A cefazolin MIC of <32 predicts susceptibility to the oral agents cefaclor,cefdinir,cefpodoxime,cefprozil,cefuroxime,cephalexin,and loracarbef when used for therapy of uncomplicated UTIs due to E.coli,K.pneumomiae,and P.mirabilis. PARENTERAL therapy: A cefazolinMIC of >8 indicates resistance to parenteralcefazolin. An alternate test method must beperformed to confirm susceptibility to parenteralcefazolin.     Radiological Exams on Admission: Dg Chest 2 View  Result Date: 11/11/2016 CLINICAL DATA:  Chest Stevens and shortness of Breath EXAM: CHEST  2 VIEW COMPARISON:  10/18/2015 FINDINGS: The heart size and mediastinal contours are within normal limits. Both lungs are clear. The visualized skeletal structures are unremarkable. IMPRESSION: No active cardiopulmonary disease. Electronically Signed   By: Inez Catalina M.D.   On: 11/11/2016 19:03    EKG: Independently reviewed.  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 -2/3 typical symptoms suggestive of atypical chest Stevens.  -CXR unremarkable.   -Initial cardiac enzymes negative.   -EKG not indicative of acute ischemia.   -TIMI risk  score is 3; which predicts a 14 days risk of death, recurrent MI, or urgent revascularization of 13.2%.  -Will plan to place in observation status on telemetry to rule out ACS by overnight observation.  -cycle cardiac enzymes q6h x 3 and repeat EKG in AM -ASA 81 mg PO daily -Given that patient's second set of enzymes bumped, this is likely considered an NSTEMI; cardiology was consulted and the patient was started on a Heparin drip (see note from Dr. Haroldine Laws). -morphine, statin given -Risk factor stratification with FLP and HgbA1c was done on 12/6 and will not be repeated (see below) -Cardiology consultation in AM - if troponin bumps again, patient will need a cardiac catheterization tomorrow -The patient's description of her symptoms -particularly in conjunction with a h/o negative stress Echo in 5/17 - was quite reassuring.  However, the elevation in her troponin from negative (0.01) to 0.12 is clearly concerning for ischemia.  H/o GI issues -If her chest Stevens is thought to be non-cardiac in nature, GI is a likely cause. -She has a h/o gastric bypass as well as bile duct CA s/p Whipple. -Her Alk Phos remains elevated since 9/17.  Hypokalemia, severe -K+ 2.8 -This is likely due to chronic Lasix and intermittent Zaroxolyn dosing -Given 40 mEq KCl PO in the ER, will give an additional 30 mEq IV - this would be expected to bring her K+ up to 3.5. -Will continue 40 mEq KCl PO BID for now, as well  DM with CKD -Glucose 283 -A1c 8.3 on 12/6 -Will hold PO meds for now -Cover with SSI -Consider initiation of insulin as an outpatient  HTN -Does not appear to have been taking any medication as an outpatient for this issue. -Will start Lopressor 12.5 mg BID and follow  HLD -Lipids: TC 143, TG 190, HDL 50, LDL 55 on 12/6 -Start Lipitor 40 mg daily with first dose now given high cardiac risk at this time  Hypothyroidism -TSH 5.18 on 12/6 -Will recheck TSH and free T4 now -For now continue  Synthroid 100/200 mcg alternating (unusual dosing)    DVT prophylaxis: Heparin drip Code Status:  Full - confirmed with patient/family Family Communication: Husband present throughout evaluation  Disposition Plan:  Home once clinically improved Consults called: Cardiology  Admission status:  It is my clinical opinion that referral for OBSERVATION is reasonable and necessary in this patient based on the above information provided. The aforementioned taken together are felt to place the patient at high risk for further clinical deterioration. However it is anticipated that the patient may be medically stable for discharge from the hospital within 24 to 48 hours.     Karmen Bongo MD [facility]  If 7PM-7AM, please contact night-coverage [email or web address] Password [email or web address]  11/12/2016, 1:15 AM

## 2016-11-11 NOTE — ED Provider Notes (Signed)
[place] DEPT Provider Note   CSN: [id number] Arrival date & time: 11/11/16  1839     History   Chief Complaint Chief Complaint  Patient presents with  . Chest Pain    HPI Courtney Stevens is a 60 y.o. female.  Patient is a 60 year old with a history of diabetes, chronic anemia, chronic kidney disease, hypertension, hyperlipidemia and prior history of cancer of the bile duct.  She presents with chest pain. She states it started about 4:00 today and describes it as a heaviness to the center of her chest. She also has some pain to her upper abdomen. She took 2 Tums and one baby aspirin without improvement in symptoms. She has reported shortness of breath associated with it as well as some nausea but no vomiting. She has a little bit of lightheadedness. She has no prior history of coronary artery disease. She states she did have a stress test which he thinks was about a year ago which she states was normal.      Past Medical History:  Diagnosis Date  . Anemia    History of blood transfusion which is when pt reports being exposed to hepatitis in 2009.  Marland Kitchen Anxiety   . Asthma   . Cancer ([place])    [place] bile duct ca (2009)   . CKD (chronic kidney disease), stage II   . Diabetes mellitus   . GERD (gastroesophageal reflux disease)   . Hepatitis   . Hyperlipidemia   . Hypertension   . Hypothyroid     Patient Active Problem List   Diagnosis Date Noted  . Chest pain 11/11/2016  . Chest pain with moderate risk for cardiac etiology 03/25/2016  . BMI 25.0-25.9,adult 10/16/2015  . GERD 07/23/2015  . Vitamin D deficiency 03/24/2014  . Medication management 03/24/2014  . 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 Hypothyroidism 11/25/2013  . Essential hypertension 11/03/2013  . Hyperlipidemia 11/03/2013  . 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 Multiple pulmonary nodules 03/06/2013  . Personal history of colonic polyps 03/03/2013    . 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 PANCREATIC INSUFFICIENCY 04/03/2010    Past Surgical History:  Procedure Laterality Date  . ABDOMINAL HYSTERECTOMY  1990   BIL OOPHORECTOMY  . APPENDECTOMY    . Bile Duct Resection  2009  . BREAST SURGERY     BIOPSY NEG  . BUNIONECTOMY Right 2005  . CHOLECYSTECTOMY  1992  . GASTRIC BYPASS  2004  . TONSILLECTOMY    . TUBAL LIGATION    . VIDEO BRONCHOSCOPY WITH ENDOBRONCHIAL ULTRASOUND N/A 10/12/2014   Procedure: VIDEO BRONCHOSCOPY WITH ENDOBRONCHIAL ULTRASOUND;  Surgeon: Brand Males, MD;  Location: [facility] OR;  Service: Thoracic;  Laterality: N/A;    OB History    No data available       Home Medications    Prior to Admission medications   Medication Sig Start Date End Date Taking? Authorizing Provider  albuterol (VENTOLIN HFA) 108 (90 BASE) MCG/ACT inhaler Inhale 2 puffs into the lungs every 4 (four) hours as needed for wheezing or shortness of breath. 09/08/15  Yes Vicie Mutters, PA-C  ALPRAZolam (XANAX) 1 MG tablet TAKE 1/2 TO 1 TABLET BY MOUTH 3 TIMES A DAY AS NEEDED Patient taking differently: Take 0.5-1 mg by mouth 3 (three) times daily as needed for anxiety.  07/02/16  Yes Unk Pinto, MD  amitriptyline (ELAVIL) 25 MG tablet  TAKE 1/2 - 2 TABLETS AT NIGHT FOR LEG PAIN 11/06/16  Yes Unk Pinto, MD  azelastine (ASTELIN) 0.1 % nasal spray PLACE 2 SPRAYS INTO BOTH NOSTRILS 2 (TWO) TIMES DAILY. USE IN [facility] NOSTRIL AS DIRECTED 06/29/16  Yes Unk Pinto, MD  calcium citrate-vitamin D (CITRACAL+D) 315-200 MG-UNIT per tablet Take 1 tablet by mouth 3 (three) times daily.   Yes Historical Provider, MD  cetirizine (ZYRTEC) 10 MG tablet Take 10 mg by mouth every morning.     Yes Historical Provider, MD  ciprofloxacin (CIPRO) 250 MG tablet Take 1 tablet (250 mg total) by mouth 2 (two) times daily. Take 1 tablet 2 x day with food for infection 11/05/16  Yes  Unk Pinto, MD  fluticasone (FLONASE) 50 MCG/ACT nasal spray PLACE 2 SPRAYS INTO BOTH NOSTRILS DAILY. 10/15/16  Yes Brand Males, MD  furosemide (LASIX) 40 MG tablet Take 1 to 2 tablets 2 x day for fluid & swelling 07/02/16 01/02/17 Yes Unk Pinto, MD  glipiZIDE (GLUCOTROL) 5 MG tablet Take 1/2 to 1 tab 3 x day if needed for elevated glucose 07/02/16  Yes Unk Pinto, MD  KLOR-CON M20 20 MEQ tablet TAKE 4 TABLETS BY MOUTH FOUR TIMES A DAY AS DIRECTED 03/23/16  Yes Courtney Forcucci, PA-C  levothyroxine (SYNTHROID, LEVOTHROID) 200 MCG tablet TAKE 1/2 TO - 1 TABLETS BY MOUTH DAILY BEFORE BREAKFAST. ALTERNATE TAKING 100 AND 200MG Patient taking differently: Take 200 mcg by mouth daily before breakfast.  07/02/16  Yes Unk Pinto, MD  lipase/protease/amylase (CREON) 12000 units CPEP capsule TAKE 1 CAPSULE BY MOUTH 3 TIMES DAILY. 07/02/16  Yes Unk Pinto, MD  metFORMIN (GLUCOPHAGE) 500 MG tablet TAKE 1-2 TABLETS BY MOUTH 3 TIMES DAILY. TAKE 1 TAB AT BREAKFAST, 1 AT LUNCH AND 2 AT [facility] 07/02/16  Yes Unk Pinto, MD  metolazone (ZAROXOLYN) 2.5 MG tablet TAKE 1 TABLET (2.5 MG TOTAL) BY MOUTH DAILY. 03/17/16  Yes Unk Pinto, MD  Multiple Vitamins-Minerals (MULTIVITAMIN WITH MINERALS) tablet Take 1 tablet by mouth daily.    Yes Historical Provider, MD  OVER THE COUNTER MEDICATION Take 1 tablet by mouth daily.   Yes Historical Provider, MD  sitaGLIPtin (JANUVIA) 100 MG tablet Take 1 tablet (100 mg total) by mouth daily. 07/02/16  Yes Unk Pinto, MD  sucralfate (CARAFATE) 1 g tablet TAKE 1 TABLET BY MOUTH 4 TIMES DAILY. 07/02/16  Yes Unk Pinto, MD  Vitamin D, Ergocalciferol, (DRISDOL) 50000 units CAPS capsule TAKE 1 CAPSULE DAILY 11/06/16  Yes Unk Pinto, MD  Blood Glucose Monitoring Suppl (ONE TOUCH ULTRA SYSTEM KIT) w/Device KIT 1 kit by Does not apply route once. 01/08/16   Unk Pinto, MD  glucose blood (ONE TOUCH TEST STRIPS) test strip CHECK BLOOD SUGAR 3 TIMES  DAILY DUE TO POORLY CONTROLLED GLUCOSE-DX-E11.65 01/08/16   Unk Pinto, MD  Lancets [facility] ULTRASOFT) lancets Check blood sugar 3 times daily due to poorly controlled glucose-DX-E11.65. 01/08/16   Unk Pinto, MD    Family History Family History  Problem Relation Age of Onset  . Colon cancer Mother   . Hypertension Mother   . Glaucoma Mother   . Diabetes Father   . Alzheimer's disease Father   . Heart attack Father 83  . Nephrolithiasis Father   . Emphysema Father     was a smoker  . Heart disease Father   . Nephrolithiasis Sister   . Heart attack Brother 75  . Nephrolithiasis Brother     Social History Social History  Substance Use Topics  .  Smoking status: Former Smoker    Packs/day: 2.00    Years: 5.00    Types: Cigarettes    Quit date: 11/18/1977  . Smokeless tobacco: Never Used  . Alcohol use Yes     Comment: 2-3 glasses of wine once per month      Allergies   Ace inhibitors; Tilade [nedocromil]; Reglan [metoclopramide]; and Betadine [povidone iodine]   Review of Systems Review of Systems  Constitutional: Negative for chills, diaphoresis, fatigue and fever.  HENT: Negative for congestion, rhinorrhea and sneezing.   Eyes: Negative.   Respiratory: Negative for cough, chest tightness and shortness of breath.   Cardiovascular: Positive for chest pain. Negative for leg swelling.  Gastrointestinal: Positive for abdominal pain and nausea. Negative for blood in stool, diarrhea and vomiting.  Genitourinary: Negative for difficulty urinating, flank pain, frequency and hematuria.  Musculoskeletal: Negative for arthralgias and back pain.  Skin: Negative for rash.  Neurological: Positive for light-headedness. Negative for dizziness, speech difficulty, weakness, numbness and headaches.     Physical Exam Updated Vital Signs BP 168/78 (BP Location: Right Arm)   Pulse (!) 48   Temp 98.1 F (36.7 C) (Oral)   Resp 16   Ht '5\' 7"'  (1.702 m)   Wt 158 lb 1.1 oz  (71.7 kg)   SpO2 100%   BMI 24.76 kg/m   Physical Exam  Constitutional: She is oriented to person, place, and time. She appears well-developed and well-nourished.  HENT:  Head: Normocephalic and atraumatic.  Eyes: Pupils are equal, round, and reactive to light.  Neck: Normal range of motion. Neck supple.  Cardiovascular: Normal rate, regular rhythm and normal heart sounds.   Pulmonary/Chest: Effort normal and breath sounds normal. No respiratory distress. She has no wheezes. She has no rales. She exhibits tenderness (Some reproducible tenderness along the sternum).  Abdominal: Soft. Bowel sounds are normal. There is tenderness (Mild tenderness to epigastrium). There is no rebound and no guarding.  Musculoskeletal: Normal range of motion. She exhibits no edema.  Lymphadenopathy:    She has no cervical adenopathy.  Neurological: She is alert and oriented to person, place, and time.  Skin: Skin is warm and dry. No rash noted.  Psychiatric: She has a normal mood and affect.     ED Treatments / Results  Labs (all labs ordered are listed, but only abnormal results are displayed) Labs Reviewed  BASIC METABOLIC PANEL - Abnormal; Notable for the following:       Result Value   Potassium 2.8 (*)    Chloride 93 (*)    Glucose, Bld 283 (*)    Creatinine, Ser 1.23 (*)    GFR calc non Af Amer 46 (*)    GFR calc Af Amer 54 (*)    All other components within normal limits  CBC - Abnormal; Notable for the following:    WBC 13.5 (*)    All other components within normal limits  I-STAT TROPOININ, ED - Abnormal; Notable for the following:    Troponin i, poc 0.12 (*)    All other components within normal limits  HEPATIC FUNCTION PANEL  LIPASE, BLOOD  HEMOGLOBIN A1C  TSH  TROPONIN I  TROPONIN I  TROPONIN I  BASIC METABOLIC PANEL  LIPID PANEL  CBC  PROTIME-INR  RAPID URINE DRUG SCREEN, HOSP PERFORMED  I-STAT TROPOININ, ED    EKG  EKG Interpretation  Date/Time:  Monday November 11 2016 18:43:51 EST Ventricular Rate:  62 PR Interval:    QRS Duration:  96 QT Interval:  444 QTC Calculation: 451 R Axis:   84 Text Interpretation:  Sinus rhythm Borderline right axis deviation Baseline wander in lead(s) V2 V3 since last tracing no significant change Confirmed by Adryan Druckenmiller  MD, Jenisa Monty ([id number]) on 11/11/2016 7:18:43 PM       Radiology Dg Chest 2 View  Result Date: 11/11/2016 CLINICAL DATA:  Chest pain and shortness of Breath EXAM: CHEST  2 VIEW COMPARISON:  10/18/2015 FINDINGS: The heart size and mediastinal contours are within normal limits. Both lungs are clear. The visualized skeletal structures are unremarkable. IMPRESSION: No active cardiopulmonary disease. Electronically Signed   By: Inez Catalina M.D.   On: 11/11/2016 19:03    Procedures Procedures (including critical care time)  Medications Ordered in ED Medications  heparin ADULT infusion 100 units/mL (25000 units/29m sodium chloride 0.45%) (not administered)  heparin bolus via infusion 3,000 Units (not administered)  ciprofloxacin (CIPRO) tablet 250 mg (not administered)  amitriptyline (ELAVIL) tablet 25 mg (not administered)  fluticasone (FLONASE) 50 MCG/ACT nasal spray 2 spray (not administered)  ALPRAZolam (XANAX) tablet 0.5-1 mg (not administered)  levothyroxine (SYNTHROID, LEVOTHROID) tablet 200 mcg (not administered)  lipase/protease/amylase (CREON) capsule 12,000 Units (not administered)  sucralfate (CARAFATE) tablet 1 g (not administered)  azelastine (ASTELIN) 0.1 % nasal spray 2 spray (not administered)  potassium chloride SA (K-DUR,KLOR-CON) CR tablet 40 mEq (not administered)  albuterol (PROVENTIL HFA;VENTOLIN HFA) 108 (90 Base) MCG/ACT inhaler 2 puff (not administered)  loratadine (CLARITIN) tablet 10 mg (not administered)  aspirin EC tablet 81 mg (not administered)  nitroGLYCERIN (NITROSTAT) SL tablet 0.4 mg (not administered)  acetaminophen (TYLENOL) tablet 650 mg (not administered)    ondansetron (ZOFRAN) injection 4 mg (not administered)  insulin aspart (novoLOG) injection 0-15 Units (not administered)  metoprolol tartrate (LOPRESSOR) tablet 12.5 mg (12.5 mg Oral Not Given 11/12/16 0052)  atorvastatin (LIPITOR) tablet 40 mg (not administered)  insulin aspart (novoLOG) injection 0-5 Units (not administered)  gi cocktail (Maalox,Lidocaine,Donnatal) (30 mLs Oral Given 11/11/16 1955)  potassium chloride SA (K-DUR,KLOR-CON) CR tablet 40 mEq (40 mEq Oral Given 11/11/16 2005)  oxyCODONE-acetaminophen (PERCOCET/ROXICET) 5-325 MG per tablet 1 tablet (1 tablet Oral Given 11/11/16 2151)  aspirin chewable tablet 324 mg (324 mg Oral Given 11/11/16 2331)     Initial Impression / Assessment and Plan / ED Course  I have reviewed the triage vital signs and the nursing notes.  Pertinent labs & imaging results that were available during my care of the patient were reviewed by me and considered in my medical decision making (see chart for details).  Clinical Course     Patient presents with chest pain. It's atypical and somewhat reproducible. Her abdominal pain resolved after GI cocktail but she still had some pain to her mid chest. She also complained of pain in her back and shoulder which seemed to be worse with movement. She was given a Percocet for this. Currently she is pain-free. She does have a mildly elevated second troponin. Given this as well as her prior risk factors, I will consult the hospitalist for admission. Her chest x-ray is clear. There is no signs of pulmonary edema or pneumonia. Her EKG doesn't show any acute ischemia.  I spoke with Dr. YLorin Mercywho requested cardiology consult. I spoke with Dr. BSung Amabilewho advises to cycle troponins and if the troponin continues to rise, consult cardiology for possible intervention. Patient was started on heparin per his recommendation.  [facility] Final Clinical Impressions(s) / ED  Diagnoses   Final diagnoses:  NSTEMI (non-ST elevated myocardial infarction) [facility])    New Prescriptions Current Discharge Medication List       Malvin Johns, MD 11/12/16 [phone]

## 2016-11-12 DIAGNOSIS — I10 Essential (primary) hypertension: Secondary | ICD-10-CM | POA: Diagnosis not present

## 2016-11-12 DIAGNOSIS — Z833 Family history of diabetes mellitus: Secondary | ICD-10-CM | POA: Diagnosis not present

## 2016-11-12 DIAGNOSIS — Z825 Family history of asthma and other chronic lower respiratory diseases: Secondary | ICD-10-CM | POA: Diagnosis not present

## 2016-11-12 DIAGNOSIS — Z9884 Bariatric surgery status: Secondary | ICD-10-CM | POA: Diagnosis not present

## 2016-11-12 DIAGNOSIS — E039 Hypothyroidism, unspecified: Secondary | ICD-10-CM | POA: Diagnosis not present

## 2016-11-12 DIAGNOSIS — E785 Hyperlipidemia, unspecified: Secondary | ICD-10-CM | POA: Diagnosis present

## 2016-11-12 DIAGNOSIS — Z7984 Long term (current) use of oral hypoglycemic drugs: Secondary | ICD-10-CM | POA: Diagnosis not present

## 2016-11-12 DIAGNOSIS — N390 Urinary tract infection, site not specified: Secondary | ICD-10-CM | POA: Diagnosis present

## 2016-11-12 DIAGNOSIS — I214 Non-ST elevation (NSTEMI) myocardial infarction: Secondary | ICD-10-CM | POA: Diagnosis present

## 2016-11-12 DIAGNOSIS — Z9049 Acquired absence of other specified parts of digestive tract: Secondary | ICD-10-CM | POA: Diagnosis not present

## 2016-11-12 DIAGNOSIS — E1121 Type 2 diabetes mellitus with diabetic nephropathy: Secondary | ICD-10-CM | POA: Diagnosis present

## 2016-11-12 DIAGNOSIS — E876 Hypokalemia: Secondary | ICD-10-CM | POA: Diagnosis not present

## 2016-11-12 DIAGNOSIS — Z800 Family history of malignant neoplasm of digestive organs: Secondary | ICD-10-CM | POA: Diagnosis not present

## 2016-11-12 DIAGNOSIS — R079 Chest pain, unspecified: Secondary | ICD-10-CM | POA: Diagnosis not present

## 2016-11-12 DIAGNOSIS — F419 Anxiety disorder, unspecified: Secondary | ICD-10-CM | POA: Diagnosis present

## 2016-11-12 DIAGNOSIS — I2510 Atherosclerotic heart disease of native coronary artery without angina pectoris: Secondary | ICD-10-CM | POA: Diagnosis not present

## 2016-11-12 DIAGNOSIS — Z7951 Long term (current) use of inhaled steroids: Secondary | ICD-10-CM | POA: Diagnosis not present

## 2016-11-12 DIAGNOSIS — Z79899 Other long term (current) drug therapy: Secondary | ICD-10-CM | POA: Diagnosis not present

## 2016-11-12 DIAGNOSIS — E1122 Type 2 diabetes mellitus with diabetic chronic kidney disease: Secondary | ICD-10-CM

## 2016-11-12 DIAGNOSIS — Z90710 Acquired absence of both cervix and uterus: Secondary | ICD-10-CM | POA: Diagnosis not present

## 2016-11-12 DIAGNOSIS — I129 Hypertensive chronic kidney disease with stage 1 through stage 4 chronic kidney disease, or unspecified chronic kidney disease: Secondary | ICD-10-CM | POA: Diagnosis present

## 2016-11-12 DIAGNOSIS — N182 Chronic kidney disease, stage 2 (mild): Secondary | ICD-10-CM

## 2016-11-12 DIAGNOSIS — I472 Ventricular tachycardia: Secondary | ICD-10-CM | POA: Diagnosis not present

## 2016-11-12 DIAGNOSIS — Z8249 Family history of ischemic heart disease and other diseases of the circulatory system: Secondary | ICD-10-CM | POA: Diagnosis not present

## 2016-11-12 DIAGNOSIS — Z820 Family history of epilepsy and other diseases of the nervous system: Secondary | ICD-10-CM | POA: Diagnosis not present

## 2016-11-12 DIAGNOSIS — I495 Sick sinus syndrome: Secondary | ICD-10-CM | POA: Diagnosis not present

## 2016-11-12 DIAGNOSIS — Z7982 Long term (current) use of aspirin: Secondary | ICD-10-CM | POA: Diagnosis not present

## 2016-11-12 DIAGNOSIS — K219 Gastro-esophageal reflux disease without esophagitis: Secondary | ICD-10-CM | POA: Diagnosis present

## 2016-11-12 DIAGNOSIS — N183 Chronic kidney disease, stage 3 (moderate): Secondary | ICD-10-CM | POA: Diagnosis not present

## 2016-11-12 LAB — BASIC METABOLIC PANEL
Anion gap: 11 (ref 5–15)
Anion gap: 8 (ref 5–15)
BUN: 17 mg/dL (ref 6–20)
BUN: 18 mg/dL (ref 6–20)
CALCIUM: 9.4 mg/dL (ref 8.9–10.3)
CHLORIDE: 93 mmol/L — AB (ref 101–111)
CO2: 31 mmol/L (ref 22–32)
CO2: 30 mmol/L (ref 22–32)
CREATININE: 1.22 mg/dL — AB (ref 0.44–1.00)
CREATININE: 1.16 mg/dL — AB (ref 0.44–1.00)
Calcium: 9 mg/dL (ref 8.9–10.3)
Chloride: 96 mmol/L — ABNORMAL LOW (ref 101–111)
GFR calc non Af Amer: 47 mL/min — ABNORMAL LOW (ref >60)
GFR, EST AFRICAN AMERICAN: 54 mL/min — AB (ref >60)
GFR, EST AFRICAN AMERICAN: 58 mL/min — AB (ref >60)
GFR, EST NON AFRICAN AMERICAN: 50 mL/min — AB (ref >60)
Glucose, Bld: 254 mg/dL — ABNORMAL HIGH (ref 65–99)
Glucose, Bld: 120 mg/dL — ABNORMAL HIGH (ref 65–99)
Potassium: 3 mmol/L — ABNORMAL LOW (ref 3.5–5.1)
Potassium: 3.4 mmol/L — ABNORMAL LOW (ref 3.5–5.1)
SODIUM: 135 mmol/L (ref 135–145)
Sodium: 134 mmol/L — ABNORMAL LOW (ref 135–145)

## 2016-11-12 LAB — HEPARIN LEVEL (UNFRACTIONATED)
HEPARIN UNFRACTIONATED: 0.21 [IU]/mL — AB (ref 0.30–0.70)
HEPARIN UNFRACTIONATED: 0.35 [IU]/mL (ref 0.30–0.70)
Heparin Unfractionated: 0.41 IU/mL (ref 0.30–0.70)

## 2016-11-12 LAB — CBC
HCT: 38.9 % (ref 36.0–46.0)
HCT: 36.9 % (ref 36.0–46.0)
Hemoglobin: 13.3 g/dL (ref 12.0–15.0)
Hemoglobin: 12.6 g/dL (ref 12.0–15.0)
MCH: 29.2 pg (ref 26.0–34.0)
MCH: 29.4 pg (ref 26.0–34.0)
MCHC: 34.2 g/dL (ref 30.0–36.0)
MCHC: 34.1 g/dL (ref 30.0–36.0)
MCV: 85.5 fL (ref 78.0–100.0)
MCV: 86 fL (ref 78.0–100.0)
PLATELETS: 221 10*3/uL (ref 150–400)
Platelets: 203 10*3/uL (ref 150–400)
RBC: 4.55 MIL/uL (ref 3.87–5.11)
RBC: 4.29 MIL/uL (ref 3.87–5.11)
RDW: 13.3 % (ref 11.5–15.5)
RDW: 13.5 % (ref 11.5–15.5)
WBC: 10.4 10*3/uL (ref 4.0–10.5)
WBC: 6.3 10*3/uL (ref 4.0–10.5)

## 2016-11-12 LAB — GLUCOSE, CAPILLARY
GLUCOSE-CAPILLARY: 175 mg/dL — AB (ref 65–99)
GLUCOSE-CAPILLARY: 94 mg/dL (ref 65–99)
Glucose-Capillary: 236 mg/dL — ABNORMAL HIGH (ref 65–99)
Glucose-Capillary: 228 mg/dL — ABNORMAL HIGH (ref 65–99)
Glucose-Capillary: 375 mg/dL — ABNORMAL HIGH (ref 65–99)

## 2016-11-12 LAB — TROPONIN I
TROPONIN I: 0.25 ng/mL — AB (ref <0.03)
Troponin I: 0.18 ng/mL (ref <0.03)
Troponin I: 0.07 ng/mL (ref <0.03)

## 2016-11-12 LAB — RAPID URINE DRUG SCREEN, HOSP PERFORMED
Amphetamines: NOT DETECTED
BARBITURATES: NOT DETECTED
Benzodiazepines: POSITIVE — AB
COCAINE: NOT DETECTED
OPIATES: NOT DETECTED
TETRAHYDROCANNABINOL: NOT DETECTED

## 2016-11-12 LAB — D-DIMER, QUANTITATIVE (NOT AT ARMC)

## 2016-11-12 LAB — TSH: TSH: 1.755 u[IU]/mL (ref 0.350–4.500)

## 2016-11-12 LAB — PROTIME-INR
INR: 1
PROTHROMBIN TIME: 13.2 s (ref 11.4–15.2)

## 2016-11-12 LAB — T4, FREE: FREE T4: 1.32 ng/dL — AB (ref 0.61–1.12)

## 2016-11-12 MED ORDER — SODIUM CHLORIDE 0.9 % IV SOLN: 30.0000 meq | Freq: Once | INTRAVENOUS | Status: DC

## 2016-11-12 MED ORDER — POTASSIUM CHLORIDE CRYS ER 20 MEQ PO TBCR: 40.0000 meq | EXTENDED_RELEASE_TABLET | Freq: Three times a day (TID) | ORAL | Status: DC

## 2016-11-12 MED ORDER — CIPROFLOXACIN HCL 250 MG PO TABS
250.0000 mg | ORAL_TABLET | Freq: Two times a day (BID) | ORAL | Status: DC
  Administered 2016-11-12 – 2016-11-14 (×6): 250 mg via ORAL
  Filled 2016-11-12 (×6): qty 1

## 2016-11-12 MED ORDER — ALBUTEROL SULFATE HFA 108 (90 BASE) MCG/ACT IN AERS: 2.0000 | INHALATION_SPRAY | RESPIRATORY_TRACT | Status: DC | PRN

## 2016-11-12 MED ORDER — POTASSIUM CHLORIDE CRYS ER 20 MEQ PO TBCR
40.0000 meq | EXTENDED_RELEASE_TABLET | Freq: Two times a day (BID) | ORAL | Status: DC
  Administered 2016-11-12 – 2016-11-14 (×6): 40 meq via ORAL
  Filled 2016-11-12 (×8): qty 2

## 2016-11-12 MED ORDER — LEVOTHYROXINE SODIUM 200 MCG PO TABS: 200.0000 ug | ORAL_TABLET | Freq: Every day | ORAL | Status: DC

## 2016-11-12 MED ORDER — HEPARIN (PORCINE) IN NACL 100-0.45 UNIT/ML-% IJ SOLN
1050.0000 [IU]/h | INTRAMUSCULAR | Status: DC
  Administered 2016-11-13: 1050 [IU]/h via INTRAVENOUS
  Filled 2016-11-12 (×3): qty 250

## 2016-11-12 MED ORDER — INSULIN ASPART 100 UNIT/ML ~~LOC~~ SOLN
0.0000 [IU] | Freq: Three times a day (TID) | SUBCUTANEOUS | Status: DC
  Administered 2016-11-12: 5 [IU] via SUBCUTANEOUS
  Administered 2016-11-12: 3 [IU] via SUBCUTANEOUS
  Administered 2016-11-13: 15:00:00 2 [IU] via SUBCUTANEOUS
  Administered 2016-11-13: 18:00:00 15 [IU] via SUBCUTANEOUS
  Administered 2016-11-14: 5 [IU] via SUBCUTANEOUS
  Administered 2016-11-14: 07:00:00 2 [IU] via SUBCUTANEOUS

## 2016-11-12 MED ORDER — MORPHINE SULFATE (PF) 2 MG/ML IV SOLN: 2.0000 mg | INTRAVENOUS | Status: DC | PRN

## 2016-11-12 MED ORDER — LEVOTHYROXINE SODIUM 100 MCG PO TABS
200.0000 ug | ORAL_TABLET | Freq: Every day | ORAL | Status: DC
  Administered 2016-11-12 – 2016-11-14 (×3): 200 ug via ORAL
  Filled 2016-11-12 (×3): qty 2

## 2016-11-12 MED ORDER — METOPROLOL TARTRATE 12.5 MG HALF TABLET
12.5000 mg | ORAL_TABLET | Freq: Two times a day (BID) | ORAL | Status: DC
  Administered 2016-11-12 – 2016-11-14 (×5): 12.5 mg via ORAL
  Filled 2016-11-12 (×5): qty 1

## 2016-11-12 MED ORDER — PANCRELIPASE (LIP-PROT-AMYL) 12000-38000 UNITS PO CPEP
12000.0000 [IU] | ORAL_CAPSULE | Freq: Three times a day (TID) | ORAL | Status: DC
  Administered 2016-11-12 – 2016-11-14 (×6): 12000 [IU] via ORAL
  Filled 2016-11-12 (×7): qty 1

## 2016-11-12 MED ORDER — ASPIRIN EC 81 MG PO TBEC
81.0000 mg | DELAYED_RELEASE_TABLET | Freq: Every day | ORAL | Status: DC
  Administered 2016-11-14: 81 mg via ORAL
  Filled 2016-11-12: qty 1

## 2016-11-12 MED ORDER — AZELASTINE HCL 0.1 % NA SOLN
2.0000 | Freq: Two times a day (BID) | NASAL | Status: DC
  Filled 2016-11-12: qty 30

## 2016-11-12 MED ORDER — AMITRIPTYLINE HCL 25 MG PO TABS
25.0000 mg | ORAL_TABLET | Freq: Every day | ORAL | Status: DC
  Administered 2016-11-12 – 2016-11-13 (×3): 25 mg via ORAL
  Filled 2016-11-12 (×3): qty 1

## 2016-11-12 MED ORDER — ALBUTEROL SULFATE (2.5 MG/3ML) 0.083% IN NEBU: 2.5000 mg | INHALATION_SOLUTION | RESPIRATORY_TRACT | Status: DC | PRN

## 2016-11-12 MED ORDER — ONDANSETRON HCL 4 MG/2ML IJ SOLN: 4.0000 mg | Freq: Four times a day (QID) | INTRAMUSCULAR | Status: DC | PRN

## 2016-11-12 MED ORDER — FLUTICASONE PROPIONATE 50 MCG/ACT NA SUSP
2.0000 | Freq: Every day | NASAL | Status: DC
  Administered 2016-11-12 – 2016-11-13 (×2): 2 via NASAL
  Filled 2016-11-12: qty 16

## 2016-11-12 MED ORDER — LORATADINE 10 MG PO TABS
10.0000 mg | ORAL_TABLET | Freq: Every day | ORAL | Status: DC
  Administered 2016-11-12 – 2016-11-14 (×4): 10 mg via ORAL
  Filled 2016-11-12 (×4): qty 1

## 2016-11-12 MED ORDER — ATORVASTATIN CALCIUM 40 MG PO TABS
40.0000 mg | ORAL_TABLET | Freq: Every day | ORAL | Status: DC
  Administered 2016-11-12 – 2016-11-13 (×3): 40 mg via ORAL
  Filled 2016-11-12 (×3): qty 1

## 2016-11-12 MED ORDER — POTASSIUM CHLORIDE 10 MEQ/100ML IV SOLN
10.0000 meq | INTRAVENOUS | Status: AC
  Administered 2016-11-12: 10 meq via INTRAVENOUS
  Filled 2016-11-12 (×3): qty 100

## 2016-11-12 MED ORDER — ALPRAZOLAM 0.5 MG PO TABS
0.5000 mg | ORAL_TABLET | Freq: Three times a day (TID) | ORAL | Status: DC | PRN
  Administered 2016-11-12 – 2016-11-13 (×3): 0.5 mg via ORAL
  Filled 2016-11-12 (×2): qty 1
  Filled 2016-11-12: qty 2

## 2016-11-12 MED ORDER — INSULIN ASPART 100 UNIT/ML ~~LOC~~ SOLN
0.0000 [IU] | Freq: Every day | SUBCUTANEOUS | Status: DC
  Administered 2016-11-12: 2 [IU] via SUBCUTANEOUS
  Administered 2016-11-12: 5 [IU] via SUBCUTANEOUS

## 2016-11-12 MED ORDER — ACETAMINOPHEN 325 MG PO TABS
650.0000 mg | ORAL_TABLET | ORAL | Status: DC | PRN
  Administered 2016-11-14: 650 mg via ORAL
  Filled 2016-11-12: qty 2

## 2016-11-12 MED ORDER — NITROGLYCERIN 0.4 MG SL SUBL: 0.4000 mg | SUBLINGUAL_TABLET | SUBLINGUAL | Status: DC | PRN

## 2016-11-12 MED ORDER — SUCRALFATE 1 G PO TABS
1.0000 g | ORAL_TABLET | Freq: Four times a day (QID) | ORAL | Status: DC
  Administered 2016-11-12 – 2016-11-14 (×10): 1 g via ORAL
  Filled 2016-11-12 (×11): qty 1

## 2016-11-12 NOTE — Progress Notes (Signed)
Inpatient Diabetes Program Recommendations  AACE/ADA: New Consensus Statement on Inpatient Glycemic Control (2015)  Target Ranges:  Prepandial:   less than 140 mg/dL      Peak postprandial:   less than 180 mg/dL (1-2 hours)      Critically ill patients:  140 - 180 mg/dL   Lab Results  Component Value Date   GLUCAP 228 (H) 11/12/2016   HGBA1C 8.3 (H) 10/23/2016   Results for Courtney Stevens, Courtney Stevens (MRN [id number]) as of 11/12/2016 12:17  Ref. Range 11/12/2016 01:19 11/12/2016 07:59 11/12/2016 11:27  Glucose-Capillary Latest Ref Range: 65 - 99 mg/dL 236 (H) 175 (H) 228 (H)   Review of Glycemic Control  Diabetes history:     DM  Outpatient Diabetes medications:     Glipizide 5 mg tab - take 0.5 to 1 tablet TIDAC prn,     Metformin 500 mg at breakfast, 500 mg at lunch, and 1000 mg at dinner,    Januvia 100 mg daily  Current orders for Inpatient glycemic control:     Novolog 0-15 units TIDAC and 0-5 units QHS  Inpatient Diabetes Program Recommendations:    Please consider Novolog 3 units TIDAC meal coverage if patient eats > 50% of meal.  Thank you,  Windy Carina, RN, MSN Diabetes Coordinator Inpatient Diabetes Program [phone] (Team Pager)

## 2016-11-12 NOTE — Consult Note (Signed)
CONSULT NOTE  Date: 11/12/2016               Patient Name:  Courtney Stevens MRN: [id number]  DOB: 08-09-1956 Age / Sex: 60 y.o., female        PCP: MCKEOWN,WILLIAM DAVID Primary Cardiologist: [place]             Referring Physician: short              Reason for Consult: NSTEMI           History of Present Illness: Patient is a 60 y.o. female with a PMHx of Hypertension, hyperlipidemia, chronic kidney disease, diabetes mellitus, who was admitted to [facility] on 11/11/2016 for evaluation of chest discomfort and a non-ST segment elevation myocardial infarction..   Patient started having some chest pressure yesterday around 3 PM. It felt like an ulcer on her chest and was very heavy pressure. It radiated through to her shoulder blades and out to her arms. It was associated with some increased shortness of breath. She has never had these episodes of chest pain before.  The pain lasted for several hours. She presented to [facility] and was started on nitroglycerin and heparin. She is currently pain-free.    Medications: Outpatient medications: Prescriptions Prior to Admission  Medication Sig Dispense Refill Last Dose  . albuterol (VENTOLIN HFA) 108 (90 BASE) MCG/ACT inhaler Inhale 2 puffs into the lungs every 4 (four) hours as needed for wheezing or shortness of breath. 1 Inhaler 4 Past Month at Unknown time  . ALPRAZolam (XANAX) 1 MG tablet TAKE 1/2 TO 1 TABLET BY MOUTH 3 TIMES A DAY AS NEEDED (Patient taking differently: Take 0.5-1 mg by mouth 3 (three) times daily as needed for anxiety. ) 270 tablet 1 11/10/2016 at Unknown time  . amitriptyline (ELAVIL) 25 MG tablet TAKE 1/2 - 2 TABLETS AT NIGHT FOR LEG PAIN 180 tablet 1 11/10/2016 at Unknown time  . azelastine (ASTELIN) 0.1 % nasal spray PLACE 2 SPRAYS INTO BOTH NOSTRILS 2 (TWO) TIMES DAILY. USE IN EACH NOSTRIL AS DIRECTED 90 mL 1 Past Month at Unknown time  . calcium citrate-vitamin D (CITRACAL+D) 315-200 MG-UNIT per  tablet Take 1 tablet by mouth 3 (three) times daily.   11/11/2016 at Unknown time  . cetirizine (ZYRTEC) 10 MG tablet Take 10 mg by mouth every morning.     11/11/2016 at Unknown time  . ciprofloxacin (CIPRO) 250 MG tablet Take 1 tablet (250 mg total) by mouth 2 (two) times daily. Take 1 tablet 2 x day with food for infection 20 tablet 0 11/10/2016 at Unknown time  . fluticasone (FLONASE) 50 MCG/ACT nasal spray PLACE 2 SPRAYS INTO BOTH NOSTRILS DAILY. 16 g 1 Past Week at Unknown time  . furosemide (LASIX) 40 MG tablet Take 1 to 2 tablets 2 x day for fluid & swelling 360 tablet 1 11/11/2016 at Unknown time  . glipiZIDE (GLUCOTROL) 5 MG tablet Take 1/2 to 1 tab 3 x day if needed for elevated glucose 270 tablet 1 11/11/2016 at Unknown time  . KLOR-CON M20 20 MEQ tablet TAKE 4 TABLETS BY MOUTH FOUR TIMES A DAY AS DIRECTED 390 tablet 4 11/11/2016 at Unknown time  . levothyroxine (SYNTHROID, LEVOTHROID) 200 MCG tablet TAKE 1/2 TO - 1 TABLETS BY MOUTH DAILY BEFORE BREAKFAST. ALTERNATE TAKING 100 AND 200MG (Patient taking differently: Take 200 mcg by mouth daily before breakfast. ) 90 tablet 1 11/11/2016 at Unknown time  . lipase/protease/amylase (CREON)  12000 units CPEP capsule TAKE 1 CAPSULE BY MOUTH 3 TIMES DAILY. 270 capsule 1 11/11/2016 at Unknown time  . metFORMIN (GLUCOPHAGE) 500 MG tablet TAKE 1-2 TABLETS BY MOUTH 3 TIMES DAILY. TAKE 1 TAB AT BREAKFAST, 1 AT LUNCH AND 2 AT DINNER 360 tablet 1 11/11/2016 at Unknown time  . metolazone (ZAROXOLYN) 2.5 MG tablet TAKE 1 TABLET (2.5 MG TOTAL) BY MOUTH DAILY. 90 tablet 1 11/11/2016 at Unknown time  . Multiple Vitamins-Minerals (MULTIVITAMIN WITH MINERALS) tablet Take 1 tablet by mouth daily.    11/10/2016 at Unknown time  . OVER THE COUNTER MEDICATION Take 1 tablet by mouth daily.   11/11/2016 at Unknown time  . sitaGLIPtin (JANUVIA) 100 MG tablet Take 1 tablet (100 mg total) by mouth daily. 90 tablet 1 11/10/2016 at Unknown time  . sucralfate (CARAFATE) 1 g  tablet TAKE 1 TABLET BY MOUTH 4 TIMES DAILY. 360 tablet 1 11/11/2016 at Unknown time  . Vitamin D, Ergocalciferol, (DRISDOL) 50000 units CAPS capsule TAKE 1 CAPSULE DAILY 30 capsule 1 11/10/2016 at Unknown time  . Blood Glucose Monitoring Suppl (ONE TOUCH ULTRA SYSTEM KIT) w/Device KIT 1 kit by Does not apply route once. 1 each 0 Taking  . glucose blood (ONE TOUCH TEST STRIPS) test strip CHECK BLOOD SUGAR 3 TIMES DAILY DUE TO POORLY CONTROLLED GLUCOSE-DX-E11.65 100 each 12 Taking  . Lancets (ONETOUCH ULTRASOFT) lancets Check blood sugar 3 times daily due to poorly controlled glucose-DX-E11.65. 100 each 12 Taking    Current medications: Current Facility-Administered Medications  Medication Dose Route Frequency Provider Last Rate Last Dose  . acetaminophen (TYLENOL) tablet 650 mg  650 mg Oral Q4H PRN Karmen Bongo, MD      . albuterol (PROVENTIL) (2.5 MG/3ML) 0.083% nebulizer solution 2.5 mg  2.5 mg Nebulization Q4H PRN Karmen Bongo, MD      . ALPRAZolam Duanne Moron) tablet 0.5-1 mg  0.5-1 mg Oral TID PRN Karmen Bongo, MD   0.5 mg at 11/12/16 0133  . amitriptyline (ELAVIL) tablet 25 mg  25 mg Oral QHS Karmen Bongo, MD   25 mg at 11/12/16 0133  . [START ON 11/13/2016] aspirin EC tablet 81 mg  81 mg Oral Daily Karmen Bongo, MD      . atorvastatin (LIPITOR) tablet 40 mg  40 mg Oral q1800 Karmen Bongo, MD      . azelastine (ASTELIN) 0.1 % nasal spray 2 spray  2 spray Each Nare BID Karmen Bongo, MD      . ciprofloxacin (CIPRO) tablet 250 mg  250 mg Oral BID Karmen Bongo, MD   250 mg at 11/12/16 1012  . fluticasone (FLONASE) 50 MCG/ACT nasal spray 2 spray  2 spray Each Nare Daily Karmen Bongo, MD   2 spray at 11/12/16 1025  . heparin ADULT infusion 100 units/mL (25000 units/240m sodium chloride 0.45%)  1,050 Units/hr Intravenous Continuous Randall K Absher, RPH 10.5 mL/hr at 11/12/16 1012 1,050 Units/hr at 11/12/16 1012  . insulin aspart (novoLOG) injection 0-15 Units  0-15 Units  Subcutaneous TID WC JKarmen Bongo MD   3 Units at 11/12/16 [phone] . insulin aspart (novoLOG) injection 0-5 Units  0-5 Units Subcutaneous QHS JKarmen Bongo MD   2 Units at 11/12/16 0156  . levothyroxine (SYNTHROID, LEVOTHROID) tablet 200 mcg  200 mcg Oral QAC breakfast JKarmen Bongo MD   200 mcg at 11/12/16 [id number] . lipase/protease/amylase (CREON) capsule 12,000 Units  12,000 Units Oral TID WC JKarmen Bongo MD   12,000 Units at 11/12/16 1013  .  loratadine (CLARITIN) tablet 10 mg  10 mg Oral Daily Karmen Bongo, MD   10 mg at 11/12/16 1013  . metoprolol tartrate (LOPRESSOR) tablet 12.5 mg  12.5 mg Oral BID Karmen Bongo, MD   12.5 mg at 11/12/16 1013  . morphine 2 MG/ML injection 2 mg  2 mg Intravenous Q2H PRN Karmen Bongo, MD      . nitroGLYCERIN (NITROSTAT) SL tablet 0.4 mg  0.4 mg Sublingual Q5 Min x 3 PRN Karmen Bongo, MD      . ondansetron [facility]) injection 4 mg  4 mg Intravenous Q6H PRN Karmen Bongo, MD      . potassium chloride SA (K-DUR,KLOR-CON) CR tablet 40 mEq  40 mEq Oral BID Karmen Bongo, MD   40 mEq at 11/12/16 1013  . sucralfate (CARAFATE) tablet 1 g  1 g Oral Q6H Karmen Bongo, MD   1 g at 11/12/16 2202     Allergies  Allergen Reactions  . Ace Inhibitors Anaphylaxis  . Tilade [Nedocromil] Anaphylaxis  . Reglan [Metoclopramide] Nausea And Vomiting  . Betadine [Povidone Iodine] Rash    iching      Past Medical History:  Diagnosis Date  . Anemia    History of blood transfusion which is when pt reports being exposed to hepatitis in 2009.  Marland Kitchen Anxiety   . Asthma   . 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 CKD (chronic kidney disease), stage II   . Diabetes mellitus   . GERD (gastroesophageal reflux disease)   . Hepatitis   . Hyperlipidemia   . Hypertension   . Hypothyroid     Past Surgical History:  Procedure Laterality Date  . ABDOMINAL HYSTERECTOMY  1990   BIL OOPHORECTOMY  . APPENDECTOMY    . Bile Duct Resection  2009  . BREAST SURGERY      BIOPSY NEG  . BUNIONECTOMY Right 2005  . CHOLECYSTECTOMY  1992  . GASTRIC BYPASS  2004  . TONSILLECTOMY    . TUBAL LIGATION    . VIDEO BRONCHOSCOPY WITH ENDOBRONCHIAL ULTRASOUND N/A 10/12/2014   Procedure: VIDEO BRONCHOSCOPY WITH ENDOBRONCHIAL ULTRASOUND;  Surgeon: Brand Males, MD;  Location: [facility] OR;  Service: Thoracic;  Laterality: N/A;    Family History  Problem Relation Age of Onset  . Colon cancer Mother   . Hypertension Mother   . Glaucoma Mother   . Diabetes Father   . Alzheimer's disease Father   . Heart attack Father 59  . Nephrolithiasis Father   . Emphysema Father     was a smoker  . Heart disease Father   . Nephrolithiasis Sister   . Heart attack Brother 18  . Nephrolithiasis Brother     Social History:  reports that she quit smoking about 39 years ago. Her smoking use included Cigarettes. She has a 10.00 pack-year smoking history. She has never used smokeless tobacco. She reports that she drinks alcohol. She reports that she does not use drugs.   Review of Systems: Constitutional:  denies fever, chills, diaphoresis, appetite change and fatigue.  HEENT: denies photophobia, eye pain, redness, hearing loss, ear pain, congestion, sore throat, rhinorrhea, sneezing, neck pain, neck stiffness and tinnitus.  Respiratory: denies SOB, DOE, cough, chest tightness, and wheezing.  Cardiovascular: admits to chest pain,  Denies palpitations and leg swelling.  Gastrointestinal: denies nausea, vomiting, abdominal pain, diarrhea, constipation, blood in stool.  Genitourinary: denies dysuria, urgency, frequency, hematuria, flank pain and difficulty urinating.  Musculoskeletal: denies  myalgias, back  pain, joint swelling, arthralgias and gait problem.   Skin: denies pallor, rash and wound.  Neurological: denies dizziness, seizures, syncope, weakness, light-headedness, numbness and headaches.   Hematological: denies adenopathy, easy bruising, personal or family bleeding  history.  Psychiatric/ Behavioral: denies suicidal ideation, mood changes, confusion, nervousness, sleep disturbance and agitation.    Physical Exam: BP 123/77   Pulse 72   Temp 97.8 F (36.6 C) (Oral)   Resp 16   Ht _0  (1.702 m)   Wt 158 lb 1.1 oz (71.7 kg)   SpO2 99%   BMI 24.76 kg/m   Wt Readings from Last 3 Encounters:  11/12/16 158 lb 1.1 oz (71.7 kg)  10/23/16 160 lb 12.8 oz (72.9 kg)  08/14/16 160 lb 3.2 oz (72.7 kg)    General: Vital signs reviewed and noted. Well-developed, well-nourished, in no acute distress; alert,   Head: Normocephalic, atraumatic, sclera anicteric,   Neck: Supple. Negative for carotid bruits. No JVD   Lungs:  Clear bilaterally, no  wheezes, rales, or rhonchi. Breathing is normal   Heart: RRR with S1 S2. No murmurs, rubs, or gallops   Abdomen/ GI :  Soft, non-tender, non-distended with normoactive bowel sounds. No hepatomegaly. No rebound/guarding. No obvious abdominal masses   MSK: Strength and the appear normal for age.   Extremities: No clubbing or cyanosis. No edema.  Distal pedal pulses are 2+ and equal   Neurologic:  CN are grossly intact,  No obvious motor or sensory defect.  Alert and oriented X 3. Moves all extremities spontaneously.  Psych: Responds to questions appropriately with a normal affect.     Lab results: Basic Metabolic Panel:  Recent Labs Lab 11/11/16 1907 11/12/16 0123  NA 136 135  K 2.8* 3.0*  CL 93* 93*  CO2 29 31  GLUCOSE 283* 254*  BUN 16 17  CREATININE 1.23* 1.22*  CALCIUM 9.9 9.0    Liver Function Tests:  Recent Labs Lab 11/11/16 1907  AST 30  ALT 26  ALKPHOS 122  BILITOT 0.9  PROT 7.8  ALBUMIN 4.7    Recent Labs Lab 11/11/16 1907  LIPASE 33   No results for input(s): AMMONIA in the last 168 hours.  CBC:  Recent Labs Lab 11/11/16 1907 11/12/16 0123 11/12/16 0851  WBC 13.5* 10.4 6.3  HGB 14.7 13.3 12.6  HCT 43.0 38.9 36.9  MCV 85.7 85.5 86.0  PLT 237 221 203    Cardiac  Enzymes:  Recent Labs Lab 11/12/16 0123 11/12/16 0702  TROPONINI 0.25* 0.18*    BNP: Invalid input(s): POCBNP  CBG:  Recent Labs Lab 11/12/16 0119 11/12/16 0759  GLUCAP 236* 175*    Coagulation Studies:  Recent Labs  11/12/16 0123  LABPROT 13.2  INR 1.00     Other results:  Personal review of EKG shows :     NSR at 62.  Mild ST depression in inferior and  lateral leads    Imaging: Dg Chest 2 View  Result Date: 11/11/2016 CLINICAL DATA:  Chest pain and shortness of Breath EXAM: CHEST  2 VIEW COMPARISON:  10/18/2015 FINDINGS: The heart size and mediastinal contours are within normal limits. Both lungs are clear. The visualized skeletal structures are unremarkable. IMPRESSION: No active cardiopulmonary disease. Electronically Signed   By: Inez Catalina M.D.   On: 11/11/2016 19:03         Assessment & Plan:  1. Non-ST segment elevation myocardial infarction.  The patient presented with episode of chest pain that lasted for  several hours. Her troponin levels are consistent with a non-ST segment elevation myocardial infarction. Her d-dimer is normal.  She is a nonsmoker. Her cholesterol levels were drawn several weeks ago and are very well-controlled. She does have a very strong family history of coronary artery disease.  We will schedule her for a cardiac catheterization tomorrow. We discussed the risks benefits and options concerning heart catheterization. She understands and agrees to proceed.      Thayer Headings, Brooke Bonito., MD, [facility] 11/12/2016, 11:19 AM Office - [phone] Pager [phone]

## 2016-11-12 NOTE — Progress Notes (Signed)
PROGRESS NOTE  Courtney Stevens  MQK:[id number] DOB: September 11, 1956 DOA: 11/11/2016 PCP: [name], MD  Brief Narrative:   Courtney Stevens is a 60 y.o. female with medical history significant of HTN, HLD, DM, CKD, hypothyroidism who presented with acute onset of chest pain "elephant sitting on chest" with associated nausea, lightheadedness and shortness of breath.  ECG stable but had very mild elevation in troponins.  Cardiology consulted and recommending heart catheterization on 12/27.    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 -EKG not indicative of acute ischemia.  -ASA 81 mg PO daily -  Heparin gtt -Risk factor stratification with FLP and HgbA1c was done on 12/6 and will not be repeated (see below) -  Check D-dimer:  negative -Cardiology assistance appreciated:  Heart catheterization 12/27  H/o gastric bypass as well as bile duct cancer s/p [place]. -Her Alk Phos remains elevated since 9/17.  Hypokalemia, severe, improving with supplementation -This is likely due to chronic Lasix and intermittent Zaroxolyn dosing -Will continue 40 mEq KCl PO BID for now, as well  DM with CKD stage 3 -A1c 8.3 on 12/6 -Hold PO meds for now -Cover with SSI - CBG trending down and will be NPO tomorrow AM, no dose adjustments today  HTN -Does not appear to have been taking any medication as an outpatient for this issue. -Continue Lopressor 12.5 mg BID and follow  HLD -Lipids: TC 143, TG 190, HDL 50, LDL 55 on 12/6 -Continue Lipitor 40 mg daily with first dose now given high cardiac risk at this time  Hypothyroidism -TSH 1.755, free T4 marginally elevated above normal  limits -For now continue Synthroid 100/200 mcg alternating (unusual dosing)   DVT prophylaxis:  Heparin gtt Code Status:  full Family Communication:  Patient alone Disposition Plan:  Heart cath on 12/27.  Possible discharge post procedure   Consultants:   Cardiology  Procedures:  none  Antimicrobials:  Anti-infectives    Start     Dose/Rate Route Frequency Ordered Stop   11/12/16 0100  ciprofloxacin (CIPRO) tablet 250 mg    Comments:  Take 1 tablet 2 x day with food for infection     250 mg Oral 2 times daily 11/12/16 0048         Subjective: No further episodes of chest pain.  Feeling better.    Objective: Vitals:   11/12/16 0045 11/12/16 0623 11/12/16 1013 11/12/16 1602  BP: 168/78 120/79 123/77 (!) 104/58  Pulse: (!) 48 (!) 58 72 (!) 54  Resp: '16 16  20  ' Temp: 98.1 F (36.7 C) 97.8 F (36.6 C)  98.4 F (36.9 C)  TempSrc: Oral Oral  Oral  SpO2: 100% 99%  100%  Weight: 71.7 kg (158 lb 1.1 oz)     Height: '5\' 7"'  (1.702 m)       Intake/Output Summary (Last 24 hours) at 11/12/16 1934 Last data filed at 11/12/16 1230  Gross per 24 hour  Intake           763.92 ml  Output                0 ml  Net  763.92 ml   Filed Weights   11/11/16 1845 11/12/16 0045  Weight: 71.7 kg (158 lb) 71.7 kg (158 lb 1.1 oz)    Examination:  General exam:  Adult female.  No acute distress.  HEENT:  NCAT, MMM Respiratory system: Clear to auscultation bilaterally Cardiovascular system: Regular rate and rhythm, normal D8/X7. 2/6 systolic heart murmur .  Warm extremities Gastrointestinal system: Normal active bowel sounds, soft, nondistended, nontender. MSK:  Normal tone and bulk, no lower extremity edema.  No palpable cords or calf swelling Neuro:  Grossly intact    Data Reviewed: I have personally reviewed following labs and imaging studies  CBC:  Recent Labs Lab 11/11/16 1907 11/12/16 0123 11/12/16 0851  WBC 13.5* 10.4 6.3  HGB 14.7 13.3 12.6  HCT 43.0  38.9 36.9  MCV 85.7 85.5 86.0  PLT 237 221 847   Basic Metabolic Panel:  Recent Labs Lab 11/11/16 1907 11/12/16 0123 11/12/16 1640  NA 136 135 134*  K 2.8* 3.0* 3.4*  CL 93* 93* 96*  CO2 '29 31 30  ' GLUCOSE 283* 254* 120*  BUN '16 17 18  ' CREATININE 1.23* 1.22* 1.16*  CALCIUM 9.9 9.0 9.4   GFR: Estimated Creatinine Clearance: 49.5 mL/min (by C-G formula based on SCr of 1.16 mg/dL (H)). Liver Function Tests:  Recent Labs Lab 11/11/16 1907  AST 30  ALT 26  ALKPHOS 122  BILITOT 0.9  PROT 7.8  ALBUMIN 4.7    Recent Labs Lab 11/11/16 1907  LIPASE 33   No results for input(s): AMMONIA in the last 168 hours. Coagulation Profile:  Recent Labs Lab 11/12/16 0123  INR 1.00   Cardiac Enzymes:  Recent Labs Lab 11/12/16 0123 11/12/16 0702 11/12/16 1640  TROPONINI 0.25* 0.18* 0.07*   BNP (last 3 results) No results for input(s): PROBNP in the last 8760 hours. HbA1C: No results for input(s): HGBA1C in the last 72 hours. CBG:  Recent Labs Lab 11/12/16 0119 11/12/16 0759 11/12/16 1127 11/12/16 1710  GLUCAP 236* 175* 228* 94   Lipid Profile: No results for input(s): CHOL, HDL, LDLCALC, TRIG, CHOLHDL, LDLDIRECT in the last 72 hours. Thyroid Function Tests:  Recent Labs  11/12/16 0123  TSH 1.755  FREET4 1.32*   Anemia Panel: No results for input(s): VITAMINB12, FOLATE, FERRITIN, TIBC, IRON, RETICCTPCT in the last 72 hours. Urine analysis:    Component Value Date/Time   COLORURINE YELLOW 11/05/2016 [place] 11/05/2016 1138   LABSPEC 1.010 11/05/2016 1138   LABSPEC 1.020 08/15/2009 1348   PHURINE 6.0 11/05/2016 1138   GLUCOSEU 2+ (A) 11/05/2016 1138   HGBUR NEGATIVE 11/05/2016 1138   BILIRUBINUR NEGATIVE 11/05/2016 1138   BILIRUBINUR Negative 08/15/2009 1348   KETONESUR NEGATIVE 11/05/2016 1138   PROTEINUR NEGATIVE 11/05/2016 1138   UROBILINOGEN 0.2 08/02/2014 1616   NITRITE NEGATIVE 11/05/2016 1138   LEUKOCYTESUR 1+ (A)  11/05/2016 1138   LEUKOCYTESUR Negative 08/15/2009 1348   Sepsis Labs: '@LABRCNTIP' (procalcitonin:4,lacticidven:4)  ) Recent Results (from the past 240 hour(s))  Urine culture     Status: None   Collection Time: 11/05/16 12:13 PM  Result Value Ref Range Status   Culture ESCHERICHIA COLI  Final    Comment: SOURCE: URINE&URINE   Colony Count >=100,000 COLONIES/ML  Final   Organism ID, Bacteria ESCHERICHIA COLI  Final      Susceptibility   Escherichia coli -  (no method available)    AMPICILLIN 8 Sensitive     AMOX/CLAVULANIC 4 Sensitive     AMPICILLIN/SULBACTAM <=2  Sensitive     PIP/TAZO <=4 Sensitive     IMIPENEM <=0.25 Sensitive     CEFAZOLIN <=4 Not Reportable     CEFTRIAXONE <=1 Sensitive     CEFTAZIDIME <=1 Sensitive     CEFEPIME <=1 Sensitive     GENTAMICIN <=1 Sensitive     TOBRAMYCIN <=1 Sensitive     CIPROFLOXACIN <=0.25 Sensitive     LEVOFLOXACIN <=0.12 Sensitive     NITROFURANTOIN <=16 Sensitive     TRIMETH/SULFA* <=20 Sensitive      * NR=NOT REPORTABLE,SEE COMMENTORAL therapy:A cefazolin MIC of <32 predicts susceptibility to the oral agents cefaclor,cefdinir,cefpodoxime,cefprozil,cefuroxime,cephalexin,and loracarbef when used for therapy of uncomplicated UTIs due to E.coli,K.pneumomiae,and P.mirabilis. PARENTERAL therapy: A cefazolinMIC of >8 indicates resistance to parenteralcefazolin. An alternate test method must beperformed to confirm susceptibility to parenteralcefazolin.      Radiology Studies: Dg Chest 2 View  Result Date: 11/11/2016 CLINICAL DATA:  Chest pain and shortness of Breath EXAM: CHEST  2 VIEW COMPARISON:  10/18/2015 FINDINGS: The heart size and mediastinal contours are within normal limits. Both lungs are clear. The visualized skeletal structures are unremarkable. IMPRESSION: No active cardiopulmonary disease. Electronically Signed   By: Inez Catalina M.D.   On: 11/11/2016 19:03     Scheduled Meds: . amitriptyline  25 mg Oral QHS  . [START ON  11/13/2016] aspirin EC  81 mg Oral Daily  . atorvastatin  40 mg Oral q1800  . azelastine  2 spray Each Nare BID  . ciprofloxacin  250 mg Oral BID  . fluticasone  2 spray Each Nare Daily  . insulin aspart  0-15 Units Subcutaneous TID WC  . insulin aspart  0-5 Units Subcutaneous QHS  . levothyroxine  200 mcg Oral QAC breakfast  . lipase/protease/amylase  12,000 Units Oral TID WC  . loratadine  10 mg Oral Daily  . metoprolol tartrate  12.5 mg Oral BID  . potassium chloride  40 mEq Oral BID  . sucralfate  1 g Oral Q6H   Continuous Infusions: . heparin 1,050 Units/hr (11/12/16 1012)     LOS: 0 days    Time spent: 30 min    Courtney Canterbury, MD [facility] Pager [phone]  If 7PM-7AM, please contact night-coverage [email or web address] Password [facility] 11/12/2016, 7:34 PM

## 2016-11-12 NOTE — Progress Notes (Signed)
[place] for Heparin Indication: chest pain/ACS  Allergies  Allergen Reactions  . Ace Inhibitors Anaphylaxis  . Tilade [Nedocromil] Anaphylaxis  . Reglan [Metoclopramide] Nausea And Vomiting  . Betadine [Povidone Iodine] Rash    iching     Patient Measurements: Height: 5\' 7"  (170.2 cm) Weight: 158 lb 1.1 oz (71.7 kg) IBW/kg (Calculated) : 61.6 Heparin Dosing Weight:   Vital Signs: Temp: 98.4 F (36.9 C) (12/26 1602) Temp Source: Oral (12/26 1602) BP: 104/58 (12/26 1602) Pulse Rate: 54 (12/26 1602)  Labs:  Recent Labs  11/11/16 1907 11/12/16 0123 11/12/16 0702 11/12/16 0851 11/12/16 1640  HGB 14.7 13.3  --  12.6  --   HCT 43.0 38.9  --  36.9  --   PLT 237 221  --  203  --   LABPROT  --  13.2  --   --   --   INR  --  1.00  --   --   --   HEPARINUNFRC  --   --   --  0.21* 0.35  CREATININE 1.23* 1.22*  --   --   --   TROPONINI  --  0.25* 0.18*  --   --     Estimated Creatinine Clearance: 47.1 mL/min (by C-G formula based on SCr of 1.22 mg/dL (H)).   Medical History: Past Medical History:  Diagnosis Date  . Anemia    History of blood transfusion which is when pt reports being exposed to hepatitis in 2009.  Marland Kitchen Anxiety   . Asthma   . Cancer ([place])    Klatkin bile duct ca (2009)   . CKD (chronic kidney disease), stage II   . Diabetes mellitus   . GERD (gastroesophageal reflux disease)   . Hepatitis   . Hyperlipidemia   . Hypertension   . Hypothyroid     Medications:  Infusions:  . heparin 1,050 Units/hr (11/12/16 1012)    Assessment: 60 y/o F presented with chest and abdominal pain, EKG with ST abnormality, troponin slightly elevated.  Orders received to begin IV heparin with pharmacy dosing assistance.  Goal of Therapy:  Heparin level 0.3-0.7 units/ml Monitor platelets by anticoagulation protocol: Yes   Today: PM Heparin level therapeutic on 1050 units/hr Hgb and pltc WNL No bleeding  noted  Plan: Continue heparin infusion @ 1050 units/hr Recheck heparin level in 6 hours to confirm therapeutic dose  Leone Haven, PharmD 11/12/2016  5:22 PM

## 2016-11-12 NOTE — Progress Notes (Signed)
ANTICOAGULATION CONSULT NOTE - Initial Consult  Pharmacy Consult for Heparin Indication: chest pain/ACS  Allergies  Allergen Reactions  . Ace Inhibitors Anaphylaxis  . Tilade [Nedocromil] Anaphylaxis  . Reglan [Metoclopramide] Nausea And Vomiting  . Betadine [Povidone Iodine] Rash    iching     Patient Measurements: Weight: 158 lb (71.7 kg) Heparin Dosing Weight:   Vital Signs: Temp: 97.5 F (36.4 C) (12/25 1845) Temp Source: Oral (12/25 1845) BP: 146/86 (12/25 2300) Pulse Rate: 57 (12/25 2300)  Labs:  Recent Labs  11/11/16 1907  HGB 14.7  HCT 43.0  PLT 237  CREATININE 1.23*    Estimated Creatinine Clearance: 46.7 mL/min (by C-G formula based on SCr of 1.23 mg/dL (H)).   Medical History: Past Medical History:  Diagnosis Date  . Anemia    History of blood transfusion which is when pt reports being exposed to hepatitis in 2009.  Marland Kitchen Anxiety   . Asthma   . Cancer ([place])    Klatkin bile duct ca (2009)   . CKD (chronic kidney disease), stage II   . Diabetes mellitus   . GERD (gastroesophageal reflux disease)   . Hepatitis   . Hyperlipidemia   . Hypertension   . Hypothyroid     Medications:  Infusions:  . heparin      Assessment: Pt with CP and +troponin.  No oral anticoagulants noted on med rec.   Goal of Therapy:  Heparin level 0.3-0.7 units/ml Monitor platelets by anticoagulation protocol: Yes   Plan:  Heparin bolus 3000 units iv x1 Heparin drip at 850 units/hr Daily CBC Next heparin level at 0800    Tyler Deis, Shea Stakes Crowford 11/12/2016,12:12 AM

## 2016-11-12 NOTE — Progress Notes (Signed)
[place] for Heparin Indication: chest pain/ACS  Allergies  Allergen Reactions  . Ace Inhibitors Anaphylaxis  . Tilade [Nedocromil] Anaphylaxis  . Reglan [Metoclopramide] Nausea And Vomiting  . Betadine [Povidone Iodine] Rash    iching     Patient Measurements: Height: 5\' 7"  (170.2 cm) Weight: 158 lb 1.1 oz (71.7 kg) IBW/kg (Calculated) : 61.6 Heparin Dosing Weight:   Vital Signs: Temp: 97.8 F (36.6 C) (12/26 0623) Temp Source: Oral (12/26 0623) BP: 120/79 (12/26 0623) Pulse Rate: 58 (12/26 0623)  Labs:  Recent Labs  11/11/16 1907 11/12/16 0123 11/12/16 0702 11/12/16 0851  HGB 14.7 13.3  --  12.6  HCT 43.0 38.9  --  36.9  PLT 237 221  --  203  LABPROT  --  13.2  --   --   INR  --  1.00  --   --   HEPARINUNFRC  --   --   --  0.21*  CREATININE 1.23* 1.22*  --   --   TROPONINI  --  0.25* 0.18*  --     Estimated Creatinine Clearance: 47.1 mL/min (by C-G formula based on SCr of 1.22 mg/dL (H)).   Medical History: Past Medical History:  Diagnosis Date  . Anemia    History of blood transfusion which is when pt reports being exposed to hepatitis in 2009.  Marland Kitchen Anxiety   . Asthma   . Cancer ([place])    Klatkin bile duct ca (2009)   . CKD (chronic kidney disease), stage II   . Diabetes mellitus   . GERD (gastroesophageal reflux disease)   . Hepatitis   . Hyperlipidemia   . Hypertension   . Hypothyroid     Medications:  Infusions:  . heparin 850 Units/hr (11/12/16 0114)    Assessment: 60 y/o F presented with chest and abdominal pain, EKG with ST abnormality, troponin slightly elevated.  Orders received to begin IV heparin with pharmacy dosing assistance.  Goal of Therapy:  Heparin level 0.3-0.7 units/ml Monitor platelets by anticoagulation protocol: Yes   Today: Heparin level subtherapeutic on 850 units/hr RN reports infusion was interrupted for only 5 minutes earlier today Hgb and pltc WNL No bleeding  reported  Plan: Increase heparin infusion to 1050 units/hr Recheck heparin level in 6 hours  Clayburn Pert, PharmD, BCPS Pager: [phone] 11/12/2016  9:50 AM

## 2016-11-13 ENCOUNTER — Encounter (HOSPITAL_COMMUNITY)
Admission: EM | Disposition: A | Discharge status: Home / Self Care | Payer: Self-pay | Source: Home / Self Care | Attending: Family Medicine

## 2016-11-13 ENCOUNTER — Ambulatory Visit (HOSPITAL_COMMUNITY)
Admission: RE | Admit: 2016-11-13 | Payer: Managed Care, Other (non HMO) | Source: Ambulatory Visit | Admitting: Cardiovascular Disease

## 2016-11-13 ENCOUNTER — Other Ambulatory Visit: Payer: Self-pay | Admitting: *Deleted

## 2016-11-13 ENCOUNTER — Encounter (HOSPITAL_COMMUNITY): Payer: Self-pay | Admitting: Cardiovascular Disease

## 2016-11-13 DIAGNOSIS — I2510 Atherosclerotic heart disease of native coronary artery without angina pectoris: Secondary | ICD-10-CM

## 2016-11-13 DIAGNOSIS — I495 Sick sinus syndrome: Secondary | ICD-10-CM

## 2016-11-13 DIAGNOSIS — E039 Hypothyroidism, unspecified: Secondary | ICD-10-CM

## 2016-11-13 HISTORY — PX: CARDIAC CATHETERIZATION: SHX172

## 2016-11-13 LAB — CBC
HEMATOCRIT: 36.5 % (ref 36.0–46.0)
Hemoglobin: 12.6 g/dL (ref 12.0–15.0)
MCH: 29.6 pg (ref 26.0–34.0)
MCHC: 34.5 g/dL (ref 30.0–36.0)
MCV: 85.7 fL (ref 78.0–100.0)
PLATELETS: 183 10*3/uL (ref 150–400)
RBC: 4.26 MIL/uL (ref 3.87–5.11)
RDW: 13.4 % (ref 11.5–15.5)
WBC: 5.3 10*3/uL (ref 4.0–10.5)

## 2016-11-13 LAB — BASIC METABOLIC PANEL
ANION GAP: 8 (ref 5–15)
BUN: 19 mg/dL (ref 6–20)
CALCIUM: 8.9 mg/dL (ref 8.9–10.3)
CO2: 27 mmol/L (ref 22–32)
Chloride: 101 mmol/L (ref 101–111)
Creatinine, Ser: 1.16 mg/dL — ABNORMAL HIGH (ref 0.44–1.00)
GFR, EST AFRICAN AMERICAN: 58 mL/min — AB (ref >60)
GFR, EST NON AFRICAN AMERICAN: 50 mL/min — AB (ref >60)
Glucose, Bld: 154 mg/dL — ABNORMAL HIGH (ref 65–99)
Potassium: 3.9 mmol/L (ref 3.5–5.1)
Sodium: 136 mmol/L (ref 135–145)

## 2016-11-13 LAB — GLUCOSE, CAPILLARY
GLUCOSE-CAPILLARY: 171 mg/dL — AB (ref 65–99)
GLUCOSE-CAPILLARY: 135 mg/dL — AB (ref 65–99)
GLUCOSE-CAPILLARY: 405 mg/dL — AB (ref 65–99)
Glucose-Capillary: 82 mg/dL (ref 65–99)

## 2016-11-13 LAB — MAGNESIUM: MAGNESIUM: 2.1 mg/dL (ref 1.7–2.4)

## 2016-11-13 SURGERY — LEFT HEART CATH AND CORONARY ANGIOGRAPHY

## 2016-11-13 MED ORDER — FENTANYL CITRATE (PF) 100 MCG/2ML IJ SOLN
INTRAMUSCULAR | Status: AC
  Filled 2016-11-13: qty 2

## 2016-11-13 MED ORDER — VERAPAMIL HCL 2.5 MG/ML IV SOLN
INTRAVENOUS | Status: DC | PRN
  Administered 2016-11-13: 12:00:00 via INTRA_ARTERIAL

## 2016-11-13 MED ORDER — SODIUM CHLORIDE 0.9% FLUSH: 3.0000 mL | Freq: Two times a day (BID) | INTRAVENOUS | Status: DC

## 2016-11-13 MED ORDER — LIDOCAINE HCL (PF) 1 % IJ SOLN
INTRAMUSCULAR | Status: AC
  Filled 2016-11-13: qty 30

## 2016-11-13 MED ORDER — ASPIRIN 81 MG PO CHEW
324.0000 mg | CHEWABLE_TABLET | ORAL | Status: AC
  Administered 2016-11-13: 324 mg via ORAL
  Filled 2016-11-13: qty 4

## 2016-11-13 MED ORDER — SODIUM CHLORIDE 0.9 % WEIGHT BASED INFUSION
3.0000 mL/kg/h | INTRAVENOUS | Status: DC
  Administered 2016-11-13: 3 mL/kg/h via INTRAVENOUS

## 2016-11-13 MED ORDER — MIDAZOLAM HCL 2 MG/2ML IJ SOLN
INTRAMUSCULAR | Status: DC | PRN
  Administered 2016-11-13: 1 mg via INTRAVENOUS

## 2016-11-13 MED ORDER — IOPAMIDOL (ISOVUE-370) INJECTION 76%
INTRAVENOUS | Status: DC | PRN
  Administered 2016-11-13: 60 mL via INTRA_ARTERIAL

## 2016-11-13 MED ORDER — LIDOCAINE HCL (PF) 1 % IJ SOLN
INTRAMUSCULAR | Status: DC | PRN
  Administered 2016-11-13: 2 mL via INTRADERMAL

## 2016-11-13 MED ORDER — FENTANYL CITRATE (PF) 100 MCG/2ML IJ SOLN
INTRAMUSCULAR | Status: DC | PRN
  Administered 2016-11-13: 25 ug via INTRAVENOUS

## 2016-11-13 MED ORDER — SODIUM CHLORIDE 0.9% FLUSH
3.0000 mL | Freq: Two times a day (BID) | INTRAVENOUS | Status: DC
Start: 2016-11-13 — End: 2016-11-13
  Administered 2016-11-13: 3 mL via INTRAVENOUS

## 2016-11-13 MED ORDER — HEPARIN SODIUM (PORCINE) 1000 UNIT/ML IJ SOLN
INTRAMUSCULAR | Status: DC | PRN
  Administered 2016-11-13: 3500 [IU] via INTRAVENOUS

## 2016-11-13 MED ORDER — SODIUM CHLORIDE 0.9 % IV SOLN: 250.0000 mL | INTRAVENOUS | Status: DC | PRN

## 2016-11-13 MED ORDER — HEPARIN SODIUM (PORCINE) 1000 UNIT/ML IJ SOLN
INTRAMUSCULAR | Status: AC
  Filled 2016-11-13: qty 1

## 2016-11-13 MED ORDER — VITAMIN D (ERGOCALCIFEROL) 1.25 MG (50000 UNIT) PO CAPS: 50000.0000 [IU] | ORAL_CAPSULE | Freq: Every day | ORAL | 1 refills | Status: DC

## 2016-11-13 MED ORDER — MIDAZOLAM HCL 2 MG/2ML IJ SOLN
INTRAMUSCULAR | Status: AC
Start: 2016-11-13 — End: 2016-11-13
  Filled 2016-11-13: qty 2

## 2016-11-13 MED ORDER — SODIUM CHLORIDE 0.9 % WEIGHT BASED INFUSION: 1.0000 mL/kg/h | INTRAVENOUS | Status: DC

## 2016-11-13 MED ORDER — SODIUM CHLORIDE 0.9% FLUSH: 3.0000 mL | INTRAVENOUS | Status: DC | PRN

## 2016-11-13 MED ORDER — ASPIRIN 81 MG PO CHEW: 324.0000 mg | CHEWABLE_TABLET | ORAL | Status: DC

## 2016-11-13 MED ORDER — HEPARIN (PORCINE) IN NACL 2-0.9 UNIT/ML-% IJ SOLN
INTRAMUSCULAR | Status: DC | PRN
Start: 2016-11-13 — End: 2016-11-13
  Administered 2016-11-13: 1000 mL via INTRA_ARTERIAL

## 2016-11-13 MED ORDER — SODIUM CHLORIDE 0.9 % WEIGHT BASED INFUSION: 3.0000 mL/kg/h | INTRAVENOUS | Status: DC

## 2016-11-13 MED ORDER — VERAPAMIL HCL 2.5 MG/ML IV SOLN
INTRAVENOUS | Status: AC
  Filled 2016-11-13: qty 2

## 2016-11-13 MED ORDER — ISOSORBIDE MONONITRATE ER 30 MG PO TB24
30.0000 mg | ORAL_TABLET | Freq: Every day | ORAL | Status: DC
Start: 2016-11-13 — End: 2016-11-14
  Administered 2016-11-13 – 2016-11-14 (×2): 30 mg via ORAL
  Filled 2016-11-13 (×2): qty 1

## 2016-11-13 MED ORDER — SODIUM CHLORIDE 0.9 % IV SOLN: INTRAVENOUS | Status: AC

## 2016-11-13 MED ORDER — IOPAMIDOL (ISOVUE-370) INJECTION 76%
INTRAVENOUS | Status: AC
  Filled 2016-11-13: qty 100

## 2016-11-13 MED ORDER — HEPARIN (PORCINE) IN NACL 2-0.9 UNIT/ML-% IJ SOLN
INTRAMUSCULAR | Status: AC
  Filled 2016-11-13: qty 1000

## 2016-11-13 SURGICAL SUPPLY — 12 items
CATH DIAG 6FR PIGTAIL ANGLED (CATHETERS) ×2 IMPLANT
CATH OPTITORQUE JACKY 4.0 5F (CATHETERS) ×2 IMPLANT
DEVICE RAD COMP TR BAND LRG (VASCULAR PRODUCTS) ×2 IMPLANT
GLIDESHEATH SLEND SS 6F .021 (SHEATH) ×2 IMPLANT
GUIDEWIRE INQWIRE 1.5J.035X260 (WIRE) IMPLANT
INQWIRE 1.5J .035X260CM (WIRE) ×3
KIT HEART LEFT (KITS) ×3 IMPLANT
PACK CARDIAC CATHETERIZATION (CUSTOM PROCEDURE TRAY) ×3 IMPLANT
SYR MEDRAD MARK V 150ML (SYRINGE) ×3 IMPLANT
TRANSDUCER W/STOPCOCK (MISCELLANEOUS) ×3 IMPLANT
TUBING CIL FLEX 10 FLL-RA (TUBING) ×3 IMPLANT
WIRE HI TORQ VERSACORE-J 145CM (WIRE) ×2 IMPLANT

## 2016-11-13 NOTE — Discharge Summary (Signed)
Physician Discharge Summary  Courtney Stevens:[id number] DOB: Apr 16, 1956 DOA: 11/11/2016  PCP: [name], MD  Admit date: 11/11/2016 Discharge date: 11/13/2016  Time spent: 25* minutes  Recommendations for Outpatient Follow-up:  1. 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  ECG stable but had very mild elevation in troponins.  Cardiology consulted and recommending heart catheterization on 12/27.    Hospital Course:   NSTEMI -CXR unremarkable. -EKG not indicative of acute ischemia.  -ASA '81mg'$  PO daily -  Heparin gtt -Risk factor stratification with FLP and HgbA1c was done on 12/6 and will not be repeated (see below) -  Check D-dimer:  negative -Cardiology assistance appreciated:  Heart catheterization today. Will transfer to [facility] for cardiac catheterization  H/o gastric bypass as well as bile duct cancer s/p [place]. -Her Alk Phos remains elevated since 9/17.  Hypokalemia, severe, improving with supplementation -This is likely due to chronic Lasix and intermittent Zaroxolyn dosing -Will continue 40 mEq KCl PO BID for now, as well  DM with CKD stage 3 -A1c 8.3 on  12/6 -Hold PO meds for now -Cover with SSI   HTN -Does not appear to have been taking any medication as an outpatient for this issue. -Continue Lopressor 12.5 mg BID and follow  HLD -Lipids: TC 143, TG 190, HDL 50, LDL 55 on 12/6 -Continue Lipitor 40 mg daily with first dose now given high cardiac risk at this time  Hypothyroidism -TSH 1.755, free T4 marginally elevated above normal limits -For now continue Synthroid 100/200 mcg alternating (unusual dosing)  Recent UTI- urine culture from 12/19 showed E coli, sensitive to Cipro. Continue Cipro as ordered.    Procedures:  None   Consultations:  Cardiology   Discharge Exam: Vitals:   11/13/16 1355 11/13/16 1400  BP: 97/62 (!) 105/51  Pulse: (!) 58 (!) 52  Resp: 12 11  Temp:      General: Appears in no acute distress Cardiovascular: RRR, S1S2 Respiratory: Clear bilaterally  Discharge Instructions    Current Discharge Medication List    START taking these medications   Details  Vitamin D, Ergocalciferol, (DRISDOL) 50000 units CAPS capsule Take 1 capsule (50,000 Units total) by mouth daily. Qty: 90 capsule, Refills: 1      CONTINUE these medications which have NOT CHANGED   Details  albuterol (VENTOLIN HFA) 108 (90 BASE) MCG/ACT inhaler Inhale 2 puffs into the lungs every 4 (four) hours as needed for wheezing or shortness of breath. Qty: 1 Inhaler, Refills: 4   Associated Diagnoses: Asthma with acute exacerbation, mild persistent    ALPRAZolam (XANAX) 1 MG tablet TAKE 1/2 TO 1 TABLET BY MOUTH 3 TIMES A DAY AS NEEDED Qty: 270 tablet, Refills: 1  Associated Diagnoses: Chronic anxiety    amitriptyline (ELAVIL) 25 MG tablet TAKE 1/2 - 2 TABLETS AT NIGHT FOR LEG PAIN Qty: 180 tablet, Refills: 1    azelastine (ASTELIN) 0.1 % nasal spray PLACE 2 SPRAYS INTO BOTH NOSTRILS 2 (TWO) TIMES DAILY. USE IN EACH NOSTRIL AS DIRECTED Qty: 90 mL, Refills: 1    calcium citrate-vitamin D (CITRACAL+D) 315-200 MG-UNIT  per tablet Take 1 tablet by mouth 3 (three) times daily.   Associated Diagnoses: Malignant neoplasm of extrahepatic bile ducts    cetirizine (ZYRTEC) 10 MG tablet Take 10 mg by mouth every morning.      ciprofloxacin (CIPRO) 250 MG tablet Take 1 tablet (250 mg total) by mouth 2 (two) times daily. Take 1 tablet 2 x day with food for infection Qty: 20 tablet, Refills: 0    fluticasone (FLONASE) 50 MCG/ACT nasal spray PLACE 2 SPRAYS INTO BOTH NOSTRILS DAILY. Qty: 16 g, Refills: 1    furosemide (LASIX) 40 MG tablet Take 1 to 2 tablets 2 x day for fluid & swelling Qty: 360 tablet, Refills: 1   Associated Diagnoses: Medication management    glipiZIDE (GLUCOTROL) 5 MG tablet Take 1/2 to 1 tab 3 x day if needed for elevated glucose Qty: 270 tablet, Refills: 1   Associated Diagnoses: Type 2 diabetes mellitus with diabetic nephropathy, without long-term current use of insulin (HCC)    KLOR-CON M20 20 MEQ tablet TAKE 4 TABLETS BY MOUTH FOUR TIMES A DAY AS DIRECTED Qty: 390 tablet, Refills: 4    levothyroxine (SYNTHROID, LEVOTHROID) 200 MCG tablet TAKE 1/2 TO - 1 TABLETS BY MOUTH DAILY BEFORE BREAKFAST. ALTERNATE TAKING 100 AND 200MG Qty: 90 tablet, Refills: 1    lipase/protease/amylase (CREON) 12000 units CPEP capsule TAKE 1 CAPSULE BY MOUTH 3 TIMES DAILY. Qty: 270 capsule, Refills: 1   Associated Diagnoses: Pancreatic insufficiency    metFORMIN (GLUCOPHAGE) 500 MG tablet TAKE 1-2 TABLETS BY MOUTH 3 TIMES DAILY. TAKE 1 TAB AT BREAKFAST, 1 AT LUNCH AND 2 AT DINNER Qty: 360 tablet, Refills: 1    metolazone (ZAROXOLYN) 2.5 MG tablet TAKE 1 TABLET (2.5 MG TOTAL) BY MOUTH DAILY. Qty: 90 tablet, Refills: 1    Multiple Vitamins-Minerals (MULTIVITAMIN WITH MINERALS) tablet Take 1 tablet by mouth daily.     OVER THE COUNTER MEDICATION Take 1 tablet by mouth daily.    sitaGLIPtin (JANUVIA) 100 MG tablet Take 1 tablet (100 mg total) by mouth daily. Qty: 90 tablet, Refills: 1    sucralfate  (CARAFATE) 1 g tablet TAKE 1 TABLET BY MOUTH 4 TIMES DAILY. Qty: 360 tablet, Refills: 1    Blood Glucose Monitoring Suppl (ONE TOUCH ULTRA SYSTEM KIT) w/Device KIT 1 kit by Does not apply route once. Qty: 1 each, Refills: 0    glucose blood (ONE TOUCH TEST STRIPS) test strip CHECK BLOOD SUGAR 3 TIMES DAILY DUE TO POORLY CONTROLLED GLUCOSE-DX-E11.65 Qty: 100 each, Refills: 12    Lancets (ONETOUCH ULTRASOFT) lancets Check blood sugar 3 times daily due to poorly controlled glucose-DX-E11.65. Qty: 100 each, Refills: 12       Allergies  Allergen Reactions  . Ace Inhibitors Anaphylaxis  . Tilade [Nedocromil] Anaphylaxis  . Reglan [Metoclopramide] Nausea And Vomiting  . Betadine [Povidone Iodine] Rash    iching       The results of significant diagnostics from this hospitalization (including imaging, microbiology, ancillary and laboratory) are listed below for reference.    Significant Diagnostic Studies: Dg Chest 2 View  Result Date: 11/11/2016 CLINICAL  DATA:  Chest pain and shortness of Breath EXAM: CHEST  2 VIEW COMPARISON:  10/18/2015 FINDINGS: The heart size and mediastinal contours are within normal limits. Both lungs are clear. The visualized skeletal structures are unremarkable. IMPRESSION: No active cardiopulmonary disease. Electronically Signed   By: Inez Catalina M.D.   On: 11/11/2016 19:03    Microbiology: Recent Results (from the past 240 hour(s))  Urine culture     Status: None   Collection Time: 11/05/16 12:13 PM  Result Value Ref Range Status   Culture ESCHERICHIA COLI  Final    Comment: SOURCE: URINE&URINE   Colony Count >=100,000 COLONIES/ML  Final   Organism ID, Bacteria ESCHERICHIA COLI  Final      Susceptibility   Escherichia coli -  (no method available)    AMPICILLIN 8 Sensitive     AMOX/CLAVULANIC 4 Sensitive     AMPICILLIN/SULBACTAM <=2 Sensitive     PIP/TAZO <=4 Sensitive     IMIPENEM <=0.25 Sensitive     CEFAZOLIN <=4 Not Reportable      CEFTRIAXONE <=1 Sensitive     CEFTAZIDIME <=1 Sensitive     CEFEPIME <=1 Sensitive     GENTAMICIN <=1 Sensitive     TOBRAMYCIN <=1 Sensitive     CIPROFLOXACIN <=0.25 Sensitive     LEVOFLOXACIN <=0.12 Sensitive     NITROFURANTOIN <=16 Sensitive     TRIMETH/SULFA* <=20 Sensitive      * NR=NOT REPORTABLE,SEE COMMENTORAL therapy:A cefazolin MIC of <32 predicts susceptibility to the oral agents cefaclor,cefdinir,cefpodoxime,cefprozil,cefuroxime,cephalexin,and loracarbef when used for therapy of uncomplicated UTIs due to E.coli,K.pneumomiae,and P.mirabilis. PARENTERAL therapy: A cefazolinMIC of >8 indicates resistance to parenteralcefazolin. An alternate test method must beperformed to confirm susceptibility to parenteralcefazolin.     Labs: Basic Metabolic Panel:  Recent Labs Lab 11/11/16 1907 11/12/16 0123 11/12/16 1640 11/13/16 0537  NA 136 135 134* 136  K 2.8* 3.0* 3.4* 3.9  CL 93* 93* 96* 101  CO2 _0 GLUCOSE 283* 254* 120* 154*  BUN _1 CREATININE 1.23* 1.22* 1.16* 1.16*  CALCIUM 9.9 9.0 9.4 8.9  MG  --   --   --  2.1   Liver Function Tests:  Recent Labs Lab 11/11/16 1907  AST 30  ALT 26  ALKPHOS 122  BILITOT 0.9  PROT 7.8  ALBUMIN 4.7    Recent Labs Lab 11/11/16 1907  LIPASE 33   No results for input(s): AMMONIA in the last 168 hours. CBC:  Recent Labs Lab 11/11/16 1907 11/12/16 0123 11/12/16 0851 11/13/16 0537  WBC 13.5* 10.4 6.3 5.3  HGB 14.7 13.3 12.6 12.6  HCT 43.0 38.9 36.9 36.5  MCV 85.7 85.5 86.0 85.7  PLT 237 221 203 183   Cardiac Enzymes:  Recent Labs Lab 11/12/16 0123 11/12/16 0702 11/12/16 1640  TROPONINI 0.25* 0.18* 0.07*    CBG:  Recent Labs Lab 11/12/16 1127 11/12/16 1710 11/12/16 2119 11/13/16 0737 11/13/16 1430  GLUCAP 228* 94 375* 171* 135*       Signed:  Eleonore Chiquito S MD.  [facility] 11/13/2016, 2:34 PM

## 2016-11-13 NOTE — Progress Notes (Signed)
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 Glad she came in, ok for cath.  Inpatient Medications    Scheduled Meds: . amitriptyline  25 mg Oral QHS  . aspirin EC  81 mg Oral Daily  . atorvastatin  40 mg Oral q1800  . azelastine  2 spray Each Nare BID  . ciprofloxacin  250 mg Oral BID  . fluticasone  2 spray Each Nare Daily  . insulin aspart  0-15 Units Subcutaneous TID WC  . insulin aspart  0-5 Units Subcutaneous QHS  . levothyroxine  200 mcg Oral QAC breakfast  . lipase/protease/amylase  12,000 Units Oral TID WC  . loratadine  10 mg Oral Daily  . metoprolol tartrate  12.5 mg Oral BID  . potassium chloride  40 mEq Oral BID  . sodium chloride flush  3 mL Intravenous Q12H  . sucralfate  1 g Oral Q6H   Continuous Infusions: . sodium chloride 1 mL/kg/hr (11/13/16 0658)  . heparin 1,050 Units/hr (11/13/16 0012)   PRN Meds: sodium chloride, acetaminophen, albuterol, ALPRAZolam, morphine injection, nitroGLYCERIN, ondansetron (ZOFRAN) IV, sodium chloride flush   Vital Signs    Vitals:   11/12/16 1013 11/12/16 1602 11/12/16 2021 11/13/16 0512  BP: 123/77 (!) 104/58 117/66 105/65  Pulse: 72 (!) 54 63 (!) 56  Resp:  20 20 20   Temp:  98.4 F (36.9 C) 98.7 F (37.1 C) 97.9 F (36.6 C)  TempSrc:  Oral Oral Oral  SpO2:  100% 98% 98%  Weight:    159 lb 11.2 oz (72.4 kg)  Height:        Intake/Output Summary (Last 24 hours) at 11/13/16 0806 Last data filed at 11/12/16 1230  Gross per 24 hour  Intake              240 ml  Output                0 ml  Net               240 ml   Filed Weights   11/11/16 1845 11/12/16 0045 11/13/16 0512  Weight: 158 lb (71.7 kg) 158 lb 1.1 oz (71.7 kg) 159 lb 11.2 oz (72.4 kg)    Physical Exam    GEN: Well nourished, well developed, in no acute distress.  HEENT: Grossly normal.  Neck: Supple, no JVD, carotid bruits, or masses. Cardiac: RRR, soft murmur, no rubs, or gallops. No clubbing, cyanosis, edema.  Radials/DP/PT 2+ and equal bilaterally.  Respiratory:  Respirations regular and unlabored, clear to auscultation bilaterally. GI: Soft, nontender, nondistended, BS + x 4. MS: no deformity or atrophy. Skin: warm and dry, no rash. Neuro:  Strength and sensation are intact. Psych: AAOx3.  Normal affect.  Labs    CBC  Recent Labs  11/12/16 0851 11/13/16 0537  WBC 6.3 5.3  HGB 12.6 12.6  HCT 36.9 36.5  MCV 86.0 85.7  PLT 203 [id number]   Basic Metabolic Panel  Recent Labs  11/12/16 1640 11/13/16 0537  NA 134* 136  K 3.4* 3.9  CL 96* 101  CO2 30 27  GLUCOSE 120* 154*  BUN 18 19  CREATININE 1.16* 1.16*  CALCIUM 9.4 8.9  MG  --  2.1   Liver Function Tests  Recent Labs  11/11/16 1907  AST 30  ALT 26  ALKPHOS 122  BILITOT 0.9  PROT 7.8  ALBUMIN 4.7    Recent Labs  11/11/16 1907  LIPASE 33   Cardiac Enzymes  Recent Labs  11/12/16 0123 11/12/16 0702 11/12/16 1640  TROPONINI 0.25* 0.18* 0.07*   D-Dimer  Recent Labs  11/12/16 0851  DDIMER <0.27   Hemoglobin A1C No results for input(s): HGBA1C in the last 72 hours. Fasting Lipid Panel Lab Results  Component Value Date   CHOL 143 10/23/2016   HDL 50 (L) 10/23/2016   LDLCALC 55 10/23/2016   TRIG 190 (H) 10/23/2016   CHOLHDL 2.9 10/23/2016   Thyroid Function Tests  Recent Labs  11/12/16 0123  TSH 1.755    Telemetry    SR, Mostly sinus brady w/ HR high 40s (asymptomatic), rare PVCs, had a 3 bt run NSVT - Personally Reviewed  ECG    n/a - Personally Reviewed  Radiology    Dg Chest 2 View  Result Date:  11/11/2016 CLINICAL DATA:  Chest pain and shortness of Breath EXAM: CHEST  2 VIEW COMPARISON:  10/18/2015 FINDINGS: The heart size and mediastinal contours are within normal limits. Both lungs are clear. The visualized skeletal structures are unremarkable. IMPRESSION: No active cardiopulmonary disease. Electronically Signed   By: Inez Catalina M.D.   On: 11/11/2016 19:03    Cardiac Studies   Cath today  Patient Profile     60 yo female w/ hx HTN, HLD, DM, bile duct CA, hypothyroid, admitted 12/25 w/ NSTEMI>>cath 12/27.  Assessment & Plan    1. Non-ST segment elevation myocardial infarction.  The patient presented with episode of chest pain that lasted for several hours. Her troponin levels are consistent with a non-ST segment elevation myocardial infarction. Her d-dimer is normal.  She is a nonsmoker. Her cholesterol levels were drawn several weeks ago and are very well-controlled. She does have a very strong family history of coronary artery disease.  Cardiac catheterization scheduled for 12/27. Dr Acie Fredrickson discussed the risks benefits and options concerning heart catheterization. She understands and agrees to proceed.  Continue high-dose statin, ASA, BB. MD advise if echo needed, EF nl 03/2016  2. Sinus bradycardia: tolerating low-dose BB well. With ventricular ectopy, would not hold unless HR sustained < 45.   SignedRosaria Ferries, PA-C  11/13/2016, 8:06 AM   Attending Note:   The patient was seen and examined.  Agree with assessment and plan as noted above.  Changes made to the above note as needed.  Patient seen and independently examined with Rosaria Ferries, PA .   We discussed all aspects of the encounter. I agree with the assessment and plan as stated above.  1. 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   Not very favorable for stenting. The LAD has moderate disease ( extralumimal calcification) just proximal to  this. I agree that medical therapy for this stenosis would be best Have added Imdur She should be able to go home tomorrow     I have spent a total of 40 minutes with patient reviewing hospital  notes , telemetry, EKGs, labs and examining patient as well as establishing an assessment and plan that was discussed  with the patient. > 50% of time was spent in direct patient care.    Thayer Headings, Brooke Bonito., MD, [facility] 11/13/2016, 5:04 PM [address],  [place] Pager [phone]

## 2016-11-13 NOTE — Interval H&P Note (Signed)
Cath Lab Visit (complete for each Cath Lab visit)  Clinical Evaluation Leading to the Procedure:   ACS: Yes.    Non-ACS:    Anginal Classification: CCS IV  Anti-ischemic medical therapy: No Therapy  Non-Invasive Test Results: No non-invasive testing performed  Prior CABG: No previous CABG      History and Physical Interval Note:  11/13/2016 11:55 AM  Courtney Stevens  has presented today for surgery, with the diagnosis of unstable angina  The various methods of treatment have been discussed with the patient and family. After consideration of risks, benefits and other options for treatment, the patient has consented to  Procedure(s): Left Heart Cath and Coronary Angiography (N/A) as a surgical intervention .  The patient's history has been reviewed, patient examined, no change in status, stable for surgery.  I have reviewed the patient's chart and labs.  Questions were answered to the patient's satisfaction.     [name]

## 2016-11-13 NOTE — Progress Notes (Signed)
Pharmacy - IV heparin  Assessment:    Please see note from Raelene Bott, PharmD earlier today for full details.  Briefly, 61 y.o. female on IV heparin for NSTEMI   Heparin level stable on 1050 units/hr  CBC stable wnl  Txfer to [facility] for heart cath today  Plan:   Continue heparin at current rate up until cath procedure  Anticipate stopping heparin if PCI, but f/u if needing to continue  Reuel Boom, PharmD, BCPS Pager: [phone] 11/13/2016, 9:44 AM

## 2016-11-13 NOTE — Progress Notes (Signed)
ANTICOAGULATION CONSULT NOTE - Follow Up Consult  Pharmacy Consult for Heparin Indication: chest pain/ACS  Allergies  Allergen Reactions  . Ace Inhibitors Anaphylaxis  . Tilade [Nedocromil] Anaphylaxis  . Reglan [Metoclopramide] Nausea And Vomiting  . Betadine [Povidone Iodine] Rash    iching     Patient Measurements: Height: 5\' 7"  (170.2 cm) Weight: 158 lb 1.1 oz (71.7 kg) IBW/kg (Calculated) : 61.6 Heparin Dosing Weight:   Vital Signs: Temp: 98.7 F (37.1 C) (12/26 2021) Temp Source: Oral (12/26 2021) BP: 117/66 (12/26 2021) Pulse Rate: 63 (12/26 2021)  Labs:  Recent Labs  11/11/16 1907 11/12/16 0123 11/12/16 0702 11/12/16 0851 11/12/16 1640 11/12/16 2316  HGB 14.7 13.3  --  12.6  --   --   HCT 43.0 38.9  --  36.9  --   --   PLT 237 221  --  203  --   --   LABPROT  --  13.2  --   --   --   --   INR  --  1.00  --   --   --   --   HEPARINUNFRC  --   --   --  0.21* 0.35 0.41  CREATININE 1.23* 1.22*  --   --  1.16*  --   TROPONINI  --  0.25* 0.18*  --  0.07*  --     Estimated Creatinine Clearance: 49.5 mL/min (by C-G formula based on SCr of 1.16 mg/dL (H)).   Medications:  Infusions:  . heparin 1,050 Units/hr (11/13/16 0012)    Assessment: Patient with heparin level at goal for 2nd time.  No heparin issues noted.    Goal of Therapy:  Heparin level 0.3-0.7 units/ml Monitor platelets by anticoagulation protocol: Yes   Plan:  Continue heparin drip at current rate Recheck level with 12/28 AM labs or after planned Cath  Nani Skillern Crowford 11/13/2016,1:54 AM

## 2016-11-13 NOTE — Progress Notes (Signed)
TR BAND REMOVAL  LOCATION:  right radial  DEFLATED PER PROTOCOL:  Yes.    TIME BAND OFF / DRESSING APPLIED:   1500   SITE UPON ARRIVAL:   Level 0  SITE AFTER BAND REMOVAL:  Level 0  CIRCULATION SENSATION AND MOVEMENT:  Within Normal Limits  Yes.    COMMENTS:    

## 2016-11-13 NOTE — H&P (View-Only) (Signed)
CONSULT NOTE  Date: 11/12/2016               Patient Name:  Courtney Stevens MRN: [id number]  DOB: 08-09-1956 Age / Sex: 60 y.o., female        PCP: MCKEOWN,WILLIAM DAVID Primary Cardiologist: [place]             Referring Physician: short              Reason for Consult: NSTEMI           History of Present Illness: Patient is a 60 y.o. female with a PMHx of Hypertension, hyperlipidemia, chronic kidney disease, diabetes mellitus, who was admitted to [facility] on 11/11/2016 for evaluation of chest discomfort and a non-ST segment elevation myocardial infarction..   Patient started having some chest pressure yesterday around 3 PM. It felt like an ulcer on her chest and was very heavy pressure. It radiated through to her shoulder blades and out to her arms. It was associated with some increased shortness of breath. She has never had these episodes of chest pain before.  The pain lasted for several hours. She presented to [facility] and was started on nitroglycerin and heparin. She is currently pain-free.    Medications: Outpatient medications: Prescriptions Prior to Admission  Medication Sig Dispense Refill Last Dose  . albuterol (VENTOLIN HFA) 108 (90 BASE) MCG/ACT inhaler Inhale 2 puffs into the lungs every 4 (four) hours as needed for wheezing or shortness of breath. 1 Inhaler 4 Past Month at Unknown time  . ALPRAZolam (XANAX) 1 MG tablet TAKE 1/2 TO 1 TABLET BY MOUTH 3 TIMES A DAY AS NEEDED (Patient taking differently: Take 0.5-1 mg by mouth 3 (three) times daily as needed for anxiety. ) 270 tablet 1 11/10/2016 at Unknown time  . amitriptyline (ELAVIL) 25 MG tablet TAKE 1/2 - 2 TABLETS AT NIGHT FOR LEG PAIN 180 tablet 1 11/10/2016 at Unknown time  . azelastine (ASTELIN) 0.1 % nasal spray PLACE 2 SPRAYS INTO BOTH NOSTRILS 2 (TWO) TIMES DAILY. USE IN EACH NOSTRIL AS DIRECTED 90 mL 1 Past Month at Unknown time  . calcium citrate-vitamin D (CITRACAL+D) 315-200 MG-UNIT per  tablet Take 1 tablet by mouth 3 (three) times daily.   11/11/2016 at Unknown time  . cetirizine (ZYRTEC) 10 MG tablet Take 10 mg by mouth every morning.     11/11/2016 at Unknown time  . ciprofloxacin (CIPRO) 250 MG tablet Take 1 tablet (250 mg total) by mouth 2 (two) times daily. Take 1 tablet 2 x day with food for infection 20 tablet 0 11/10/2016 at Unknown time  . fluticasone (FLONASE) 50 MCG/ACT nasal spray PLACE 2 SPRAYS INTO BOTH NOSTRILS DAILY. 16 g 1 Past Week at Unknown time  . furosemide (LASIX) 40 MG tablet Take 1 to 2 tablets 2 x day for fluid & swelling 360 tablet 1 11/11/2016 at Unknown time  . glipiZIDE (GLUCOTROL) 5 MG tablet Take 1/2 to 1 tab 3 x day if needed for elevated glucose 270 tablet 1 11/11/2016 at Unknown time  . KLOR-CON M20 20 MEQ tablet TAKE 4 TABLETS BY MOUTH FOUR TIMES A DAY AS DIRECTED 390 tablet 4 11/11/2016 at Unknown time  . levothyroxine (SYNTHROID, LEVOTHROID) 200 MCG tablet TAKE 1/2 TO - 1 TABLETS BY MOUTH DAILY BEFORE BREAKFAST. ALTERNATE TAKING 100 AND 200MG (Patient taking differently: Take 200 mcg by mouth daily before breakfast. ) 90 tablet 1 11/11/2016 at Unknown time  . lipase/protease/amylase (CREON)  12000 units CPEP capsule TAKE 1 CAPSULE BY MOUTH 3 TIMES DAILY. 270 capsule 1 11/11/2016 at Unknown time  . metFORMIN (GLUCOPHAGE) 500 MG tablet TAKE 1-2 TABLETS BY MOUTH 3 TIMES DAILY. TAKE 1 TAB AT BREAKFAST, 1 AT LUNCH AND 2 AT DINNER 360 tablet 1 11/11/2016 at Unknown time  . metolazone (ZAROXOLYN) 2.5 MG tablet TAKE 1 TABLET (2.5 MG TOTAL) BY MOUTH DAILY. 90 tablet 1 11/11/2016 at Unknown time  . Multiple Vitamins-Minerals (MULTIVITAMIN WITH MINERALS) tablet Take 1 tablet by mouth daily.    11/10/2016 at Unknown time  . OVER THE COUNTER MEDICATION Take 1 tablet by mouth daily.   11/11/2016 at Unknown time  . sitaGLIPtin (JANUVIA) 100 MG tablet Take 1 tablet (100 mg total) by mouth daily. 90 tablet 1 11/10/2016 at Unknown time  . sucralfate (CARAFATE) 1 g  tablet TAKE 1 TABLET BY MOUTH 4 TIMES DAILY. 360 tablet 1 11/11/2016 at Unknown time  . Vitamin D, Ergocalciferol, (DRISDOL) 50000 units CAPS capsule TAKE 1 CAPSULE DAILY 30 capsule 1 11/10/2016 at Unknown time  . Blood Glucose Monitoring Suppl (ONE TOUCH ULTRA SYSTEM KIT) w/Device KIT 1 kit by Does not apply route once. 1 each 0 Taking  . glucose blood (ONE TOUCH TEST STRIPS) test strip CHECK BLOOD SUGAR 3 TIMES DAILY DUE TO POORLY CONTROLLED GLUCOSE-DX-E11.65 100 each 12 Taking  . Lancets (ONETOUCH ULTRASOFT) lancets Check blood sugar 3 times daily due to poorly controlled glucose-DX-E11.65. 100 each 12 Taking    Current medications: Current Facility-Administered Medications  Medication Dose Route Frequency Provider Last Rate Last Dose  . acetaminophen (TYLENOL) tablet 650 mg  650 mg Oral Q4H PRN Karmen Bongo, MD      . albuterol (PROVENTIL) (2.5 MG/3ML) 0.083% nebulizer solution 2.5 mg  2.5 mg Nebulization Q4H PRN Karmen Bongo, MD      . ALPRAZolam Duanne Moron) tablet 0.5-1 mg  0.5-1 mg Oral TID PRN Karmen Bongo, MD   0.5 mg at 11/12/16 0133  . amitriptyline (ELAVIL) tablet 25 mg  25 mg Oral QHS Karmen Bongo, MD   25 mg at 11/12/16 0133  . [START ON 11/13/2016] aspirin EC tablet 81 mg  81 mg Oral Daily Karmen Bongo, MD      . atorvastatin (LIPITOR) tablet 40 mg  40 mg Oral q1800 Karmen Bongo, MD      . azelastine (ASTELIN) 0.1 % nasal spray 2 spray  2 spray Each Nare BID Karmen Bongo, MD      . ciprofloxacin (CIPRO) tablet 250 mg  250 mg Oral BID Karmen Bongo, MD   250 mg at 11/12/16 1012  . fluticasone (FLONASE) 50 MCG/ACT nasal spray 2 spray  2 spray Each Nare Daily Karmen Bongo, MD   2 spray at 11/12/16 1025  . heparin ADULT infusion 100 units/mL (25000 units/240m sodium chloride 0.45%)  1,050 Units/hr Intravenous Continuous Randall K Absher, RPH 10.5 mL/hr at 11/12/16 1012 1,050 Units/hr at 11/12/16 1012  . insulin aspart (novoLOG) injection 0-15 Units  0-15 Units  Subcutaneous TID WC JKarmen Bongo MD   3 Units at 11/12/16 [phone] . insulin aspart (novoLOG) injection 0-5 Units  0-5 Units Subcutaneous QHS JKarmen Bongo MD   2 Units at 11/12/16 0156  . levothyroxine (SYNTHROID, LEVOTHROID) tablet 200 mcg  200 mcg Oral QAC breakfast JKarmen Bongo MD   200 mcg at 11/12/16 [id number] . lipase/protease/amylase (CREON) capsule 12,000 Units  12,000 Units Oral TID WC JKarmen Bongo MD   12,000 Units at 11/12/16 1013  .  loratadine (CLARITIN) tablet 10 mg  10 mg Oral Daily Karmen Bongo, MD   10 mg at 11/12/16 1013  . metoprolol tartrate (LOPRESSOR) tablet 12.5 mg  12.5 mg Oral BID Karmen Bongo, MD   12.5 mg at 11/12/16 1013  . morphine 2 MG/ML injection 2 mg  2 mg Intravenous Q2H PRN Karmen Bongo, MD      . nitroGLYCERIN (NITROSTAT) SL tablet 0.4 mg  0.4 mg Sublingual Q5 Min x 3 PRN Karmen Bongo, MD      . ondansetron [facility]) injection 4 mg  4 mg Intravenous Q6H PRN Karmen Bongo, MD      . potassium chloride SA (K-DUR,KLOR-CON) CR tablet 40 mEq  40 mEq Oral BID Karmen Bongo, MD   40 mEq at 11/12/16 1013  . sucralfate (CARAFATE) tablet 1 g  1 g Oral Q6H Karmen Bongo, MD   1 g at 11/12/16 2202     Allergies  Allergen Reactions  . Ace Inhibitors Anaphylaxis  . Tilade [Nedocromil] Anaphylaxis  . Reglan [Metoclopramide] Nausea And Vomiting  . Betadine [Povidone Iodine] Rash    iching      Past Medical History:  Diagnosis Date  . Anemia    History of blood transfusion which is when pt reports being exposed to hepatitis in 2009.  Marland Kitchen Anxiety   . Asthma   . 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 CKD (chronic kidney disease), stage II   . Diabetes mellitus   . GERD (gastroesophageal reflux disease)   . Hepatitis   . Hyperlipidemia   . Hypertension   . Hypothyroid     Past Surgical History:  Procedure Laterality Date  . ABDOMINAL HYSTERECTOMY  1990   BIL OOPHORECTOMY  . APPENDECTOMY    . Bile Duct Resection  2009  . BREAST SURGERY      BIOPSY NEG  . BUNIONECTOMY Right 2005  . CHOLECYSTECTOMY  1992  . GASTRIC BYPASS  2004  . TONSILLECTOMY    . TUBAL LIGATION    . VIDEO BRONCHOSCOPY WITH ENDOBRONCHIAL ULTRASOUND N/A 10/12/2014   Procedure: VIDEO BRONCHOSCOPY WITH ENDOBRONCHIAL ULTRASOUND;  Surgeon: Brand Males, MD;  Location: [facility] OR;  Service: Thoracic;  Laterality: N/A;    Family History  Problem Relation Age of Onset  . Colon cancer Mother   . Hypertension Mother   . Glaucoma Mother   . Diabetes Father   . Alzheimer's disease Father   . Heart attack Father 59  . Nephrolithiasis Father   . Emphysema Father     was a smoker  . Heart disease Father   . Nephrolithiasis Sister   . Heart attack Brother 18  . Nephrolithiasis Brother     Social History:  reports that she quit smoking about 39 years ago. Her smoking use included Cigarettes. She has a 10.00 pack-year smoking history. She has never used smokeless tobacco. She reports that she drinks alcohol. She reports that she does not use drugs.   Review of Systems: Constitutional:  denies fever, chills, diaphoresis, appetite change and fatigue.  HEENT: denies photophobia, eye pain, redness, hearing loss, ear pain, congestion, sore throat, rhinorrhea, sneezing, neck pain, neck stiffness and tinnitus.  Respiratory: denies SOB, DOE, cough, chest tightness, and wheezing.  Cardiovascular: admits to chest pain,  Denies palpitations and leg swelling.  Gastrointestinal: denies nausea, vomiting, abdominal pain, diarrhea, constipation, blood in stool.  Genitourinary: denies dysuria, urgency, frequency, hematuria, flank pain and difficulty urinating.  Musculoskeletal: denies  myalgias, back  pain, joint swelling, arthralgias and gait problem.   Skin: denies pallor, rash and wound.  Neurological: denies dizziness, seizures, syncope, weakness, light-headedness, numbness and headaches.   Hematological: denies adenopathy, easy bruising, personal or family bleeding  history.  Psychiatric/ Behavioral: denies suicidal ideation, mood changes, confusion, nervousness, sleep disturbance and agitation.    Physical Exam: BP 123/77   Pulse 72   Temp 97.8 F (36.6 C) (Oral)   Resp 16   Ht _0  (1.702 m)   Wt 158 lb 1.1 oz (71.7 kg)   SpO2 99%   BMI 24.76 kg/m   Wt Readings from Last 3 Encounters:  11/12/16 158 lb 1.1 oz (71.7 kg)  10/23/16 160 lb 12.8 oz (72.9 kg)  08/14/16 160 lb 3.2 oz (72.7 kg)    General: Vital signs reviewed and noted. Well-developed, well-nourished, in no acute distress; alert,   Head: Normocephalic, atraumatic, sclera anicteric,   Neck: Supple. Negative for carotid bruits. No JVD   Lungs:  Clear bilaterally, no  wheezes, rales, or rhonchi. Breathing is normal   Heart: RRR with S1 S2. No murmurs, rubs, or gallops   Abdomen/ GI :  Soft, non-tender, non-distended with normoactive bowel sounds. No hepatomegaly. No rebound/guarding. No obvious abdominal masses   MSK: Strength and the appear normal for age.   Extremities: No clubbing or cyanosis. No edema.  Distal pedal pulses are 2+ and equal   Neurologic:  CN are grossly intact,  No obvious motor or sensory defect.  Alert and oriented X 3. Moves all extremities spontaneously.  Psych: Responds to questions appropriately with a normal affect.     Lab results: Basic Metabolic Panel:  Recent Labs Lab 11/11/16 1907 11/12/16 0123  NA 136 135  K 2.8* 3.0*  CL 93* 93*  CO2 29 31  GLUCOSE 283* 254*  BUN 16 17  CREATININE 1.23* 1.22*  CALCIUM 9.9 9.0    Liver Function Tests:  Recent Labs Lab 11/11/16 1907  AST 30  ALT 26  ALKPHOS 122  BILITOT 0.9  PROT 7.8  ALBUMIN 4.7    Recent Labs Lab 11/11/16 1907  LIPASE 33   No results for input(s): AMMONIA in the last 168 hours.  CBC:  Recent Labs Lab 11/11/16 1907 11/12/16 0123 11/12/16 0851  WBC 13.5* 10.4 6.3  HGB 14.7 13.3 12.6  HCT 43.0 38.9 36.9  MCV 85.7 85.5 86.0  PLT 237 221 203    Cardiac  Enzymes:  Recent Labs Lab 11/12/16 0123 11/12/16 0702  TROPONINI 0.25* 0.18*    BNP: Invalid input(s): POCBNP  CBG:  Recent Labs Lab 11/12/16 0119 11/12/16 0759  GLUCAP 236* 175*    Coagulation Studies:  Recent Labs  11/12/16 0123  LABPROT 13.2  INR 1.00     Other results:  Personal review of EKG shows :     NSR at 62.  Mild ST depression in inferior and  lateral leads    Imaging: Dg Chest 2 View  Result Date: 11/11/2016 CLINICAL DATA:  Chest pain and shortness of Breath EXAM: CHEST  2 VIEW COMPARISON:  10/18/2015 FINDINGS: The heart size and mediastinal contours are within normal limits. Both lungs are clear. The visualized skeletal structures are unremarkable. IMPRESSION: No active cardiopulmonary disease. Electronically Signed   By: Inez Catalina M.D.   On: 11/11/2016 19:03         Assessment & Plan:  1. Non-ST segment elevation myocardial infarction.  The patient presented with episode of chest pain that lasted for  several hours. Her troponin levels are consistent with a non-ST segment elevation myocardial infarction. Her d-dimer is normal.  She is a nonsmoker. Her cholesterol levels were drawn several weeks ago and are very well-controlled. She does have a very strong family history of coronary artery disease.  We will schedule her for a cardiac catheterization tomorrow. We discussed the risks benefits and options concerning heart catheterization. She understands and agrees to proceed.      Thayer Headings, Brooke Bonito., MD, [facility] 11/12/2016, 11:19 AM Office - [phone] Pager [phone]

## 2016-11-14 ENCOUNTER — Other Ambulatory Visit: Payer: Self-pay | Admitting: Physician Assistant

## 2016-11-14 ENCOUNTER — Other Ambulatory Visit: Payer: Self-pay

## 2016-11-14 ENCOUNTER — Encounter (HOSPITAL_COMMUNITY): Payer: Self-pay | Admitting: Physician Assistant

## 2016-11-14 DIAGNOSIS — E782 Mixed hyperlipidemia: Secondary | ICD-10-CM

## 2016-11-14 DIAGNOSIS — I493 Ventricular premature depolarization: Secondary | ICD-10-CM

## 2016-11-14 DIAGNOSIS — E876 Hypokalemia: Secondary | ICD-10-CM

## 2016-11-14 DIAGNOSIS — I2510 Atherosclerotic heart disease of native coronary artery without angina pectoris: Secondary | ICD-10-CM

## 2016-11-14 DIAGNOSIS — N183 Chronic kidney disease, stage 3 unspecified: Secondary | ICD-10-CM

## 2016-11-14 DIAGNOSIS — I10 Essential (primary) hypertension: Secondary | ICD-10-CM

## 2016-11-14 LAB — BASIC METABOLIC PANEL
Anion gap: 8 (ref 5–15)
BUN: 15 mg/dL (ref 6–20)
CALCIUM: 9.2 mg/dL (ref 8.9–10.3)
CO2: 24 mmol/L (ref 22–32)
CREATININE: 1.27 mg/dL — AB (ref 0.44–1.00)
Chloride: 104 mmol/L (ref 101–111)
GFR, EST AFRICAN AMERICAN: 52 mL/min — AB (ref >60)
GFR, EST NON AFRICAN AMERICAN: 45 mL/min — AB (ref >60)
Glucose, Bld: 251 mg/dL — ABNORMAL HIGH (ref 65–99)
Potassium: 4.6 mmol/L (ref 3.5–5.1)
SODIUM: 136 mmol/L (ref 135–145)

## 2016-11-14 LAB — RAPID URINE DRUG SCREEN, HOSP PERFORMED
AMPHETAMINES: NOT DETECTED
BARBITURATES: NOT DETECTED
Benzodiazepines: POSITIVE — AB
Cocaine: NOT DETECTED
OPIATES: NOT DETECTED
TETRAHYDROCANNABINOL: NOT DETECTED

## 2016-11-14 LAB — GLUCOSE, CAPILLARY
GLUCOSE-CAPILLARY: 141 mg/dL — AB (ref 65–99)
GLUCOSE-CAPILLARY: 219 mg/dL — AB (ref 65–99)

## 2016-11-14 LAB — CBC
HEMATOCRIT: 35.2 % — AB (ref 36.0–46.0)
Hemoglobin: 11.5 g/dL — ABNORMAL LOW (ref 12.0–15.0)
MCH: 28.8 pg (ref 26.0–34.0)
MCHC: 32.7 g/dL (ref 30.0–36.0)
MCV: 88.2 fL (ref 78.0–100.0)
PLATELETS: 179 10*3/uL (ref 150–400)
RBC: 3.99 MIL/uL (ref 3.87–5.11)
RDW: 13.5 % (ref 11.5–15.5)
WBC: 4.5 10*3/uL (ref 4.0–10.5)

## 2016-11-14 LAB — MAGNESIUM: MAGNESIUM: 2.1 mg/dL (ref 1.7–2.4)

## 2016-11-14 MED ORDER — METOPROLOL TARTRATE 25 MG PO TABS: 12.5000 mg | ORAL_TABLET | Freq: Two times a day (BID) | ORAL | 6 refills | Status: DC

## 2016-11-14 MED ORDER — FUROSEMIDE 40 MG PO TABS: 40.0000 mg | ORAL_TABLET | Freq: Two times a day (BID) | ORAL | 2 refills | Status: DC

## 2016-11-14 MED ORDER — POTASSIUM CHLORIDE CRYS ER 20 MEQ PO TBCR: 40.0000 meq | EXTENDED_RELEASE_TABLET | Freq: Two times a day (BID) | ORAL | 2 refills | Status: DC

## 2016-11-14 MED ORDER — ATORVASTATIN CALCIUM 40 MG PO TABS: 40.0000 mg | ORAL_TABLET | Freq: Every evening | ORAL | 6 refills | Status: DC

## 2016-11-14 MED ORDER — ISOSORBIDE MONONITRATE ER 30 MG PO TB24: 30.0000 mg | ORAL_TABLET | Freq: Every day | ORAL | 6 refills | Status: DC

## 2016-11-14 MED ORDER — NITROGLYCERIN 0.4 MG SL SUBL: 0.4000 mg | SUBLINGUAL_TABLET | SUBLINGUAL | 3 refills | Status: AC | PRN

## 2016-11-14 MED ORDER — ASPIRIN 81 MG PO TBEC: 81.0000 mg | DELAYED_RELEASE_TABLET | Freq: Every day | ORAL | 3 refills | Status: AC

## 2016-11-14 NOTE — Care Management Note (Signed)
Case Management Note  Patient Details  Name: Courtney Stevens MRN: [id number] Date of Birth: [birth year]  Subjective/Objective:   Presents with NSTEMI, s/p  Heart cath, for dc today, no needs.                Action/Plan:   Expected Discharge Date:  11/13/16               Expected Discharge Plan:  Home/Self Care  In-House Referral:     Discharge planning Services  CM Consult  Post Acute Care Choice:    Choice offered to:     DME Arranged:    DME Agency:     HH Arranged:    HH Agency:     Status of Service:  Completed, signed off  If discussed at [employer] of Stay Meetings, dates discussed:    Additional Comments:  Zenon Mayo, RN 11/14/2016, 2:57 PM

## 2016-11-14 NOTE — Progress Notes (Signed)
CARDIAC REHAB PHASE I   PRE:  Rate/Rhythm: 75 SR  BP:  Sitting: 105/53        SaO2: 98 RA  MODE:  Ambulation: 500 ft   POST:  Rate/Rhythm: 86 SR c/ PVCs  BP:  Sitting: 117/52         SaO2: 99 RA  Pt ambulated 500 ft on RA, handheld assist, steady gait, tolerated well with no complaints. Completed MI education.  Reviewed risk factors, MI book, anti-platelet therapy, activity restrictions, ntg, exercise, heart healthy diet, carb counting, portion control, and phase 2 cardiac rehab. Pt verbalized understanding, receptive to education. Pt agrees to phase 2 cardiac rehab referral, will send to [facility] per pt request. Pt to bed per pt request after walk, call bell within reach.   [place], RN, BSN 11/14/2016 9:45 AM

## 2016-11-14 NOTE — Discharge Summary (Signed)
Discharge Summary    Patient ID: [name],  MRN: [id number], DOB/AGE: 04/30/1956 60 y.o.  Admit date: 11/11/2016 Discharge date: 11/14/2016  Primary Care Provider: MCKEOWN,WILLIAM DAVID Primary Cardiologist: Dr. Martinique  Discharge Diagnoses    Principal Problem:   NSTEMI (non-ST elevated myocardial infarction) [facility]) Active Problems:   CAD in native artery   Malignant neoplasm of extrahepatic bile duct ([place])   Type II diabetes mellitus with renal manifestations    Essential hypertension   Hyperlipidemia   History of Roux-en-Y gastric bypass   Hypothyroidism   Hypokalemia   CKD (chronic kidney disease), stage III   PVC's (premature ventricular contractions)    Diagnostic Studies/Procedures    1. LHC 11/14/16 (see report for diagram) Narrative & Impression      The left ventricular systolic function is normal.  LV end diastolic pressure is mildly elevated.  The left ventricular ejection fraction is 55-65% by visual estimate.  Prox RCA lesion, 30 %stenosed.  Prox LAD to Mid LAD lesion, 20 %stenosed.  Ost 2nd Diag to 2nd Diag lesion, 90 %stenosed.   1. No evidence of obstructive disease in main coronary arteries. The culprit for non-ST elevation myocardial infarction is a 90% ostial stenosis and second diagonal which is a 2 mm in diameter and supplies a relatively small territory. 2. Normal LV systolic function and mildly elevated left ventricular end-diastolic pressure.  Recommendations: Given the ostial location of second diagonal stenosis and the fact that the vessel is only about 2 mm in diameter, I recommend medical therapy. I started the patient on small dose Imdur. Possible discharge home tomorrow.      _____________     History of Present Illness     Courtney Stevens is a 60 y.o. female with history of HTN, HLD, CKD stage II-III, DM, bile duct CA s/p whipple, gastric bypass surgery, hepatitis, anemia, family history of CAD who presented to [facility]  with chest pressure and was found to have a non-STEMI. She has previous history of stress echo 03/2016 which was normal.  Hospital Course    This admission she presented with chest as well as some abdominal discomfort. Troponin peak was 0.25. Labs otherwise notable for significant hypokalemia, CKD (Cr 1.2 range), recent LDL 55, normal CBC, negative d-dimer, normal TSH and mildly elevated FT4. She was placed on IV heparin with plan for cardiac cath. She was continued on low dose beta blocker with telemetry showing occasional sinus bradycardia (high 40s, asymptomatic), rare PVCs and one triplet. LHC 11/13/16 showed EF 55-65%, 40% prox RCA, 20% prox-mid LAD, 90% D2 lesion. The culprit for the NSTEMI appeared to be the 90% ostial stenosis in second diagonal which is a 2 mm in diameter and supplies a relatively small territory. She had mildly elevated left ventricular end-diastolic pressure. Given the ostial location of second diagonal stenosis and the fact that the vessel is only about 2 mm in diameter, medical therapy was recommended. She was started on Imdur, beta blocker, and statin. Given her history of GI issues and anemia in the past as well as small caliber of vessel, Dr. Radford Pax did not feel she requires addition of Plavix at this time. Today she feels well. Internal medicine has asked that we assume care as she stayed at [facility] after her LHC yesterday. Dr. Radford Pax has seen and examined the patient today and feels she is stable for discharge. She was advised that she may restart Metformin on 11/16/16.   Regarding her severe hypokalemia this  admission, this was felt due to high dose diuretic regimen - was on Lasix 50m 1-2 tablets BID as well as daily zaroxolyn prior to admission. It is unclear why she was on daily Zaroxolyn. These were held during admission, and her potassium was aggressively repleted. Per review with Dr. TRadford Pax will hold Zaroxolyn and resume Lasix at 418mBID. Potassium dose was also  adjusted. Today's BMET showed Cr 1.27, K 4.6 but possibly hemolyzed. Dr. TuRadford Paxequested BMET tomorrow given that we are going into the long weekend. It was requested she hold off returning to work until cleared in follow-up. Work note was provided. TOC visit was arranged. If the patient is tolerating statin at time of follow-up appointment, would consider rechecking liver function/lipid panel in 6 weeks. She was advised to f/u PCP within 1 week as well (abnormal thyroid function, diuretic regimen).  ____________  Discharge Vitals Blood pressure 106/72, pulse 61, temperature 98.1 F (36.7 C), temperature source Oral, resp. rate 15, height '5\' 7"'  (1.702 m), weight 160 lb 15 oz (73 kg), SpO2 97 %.  Filed Weights   11/12/16 0045 11/13/16 0512 11/14/16 0631  Weight: 158 lb 1.1 oz (71.7 kg) 159 lb 11.2 oz (72.4 kg) 160 lb 15 oz (73 kg)    Labs & Radiologic Studies    CBC  Recent Labs  11/13/16 0537 11/14/16 0617  WBC 5.3 4.5  HGB 12.6 11.5*  HCT 36.5 35.2*  MCV 85.7 88.2  PLT 183 17672 Basic Metabolic Panel  Recent Labs  11/13/16 0537 11/14/16 0617 11/14/16 1222  NA 136  --  136  K 3.9  --  4.6  CL 101  --  104  CO2 27  --  24  GLUCOSE 154*  --  251*  BUN 19  --  15  CREATININE 1.16*  --  1.27*  CALCIUM 8.9  --  9.2  MG 2.1 2.1  --    Liver Function Tests  Recent Labs  11/11/16 1907  AST 30  ALT 26  ALKPHOS 122  BILITOT 0.9  PROT 7.8  ALBUMIN 4.7    Recent Labs  11/11/16 1907  LIPASE 33   Cardiac Enzymes  Recent Labs  11/12/16 0123 11/12/16 0702 11/12/16 1640  TROPONINI 0.25* 0.18* 0.07*   D-Dimer  Recent Labs  11/12/16 0851  DDIMER <0.27   Thyroid Function Tests  Recent Labs  11/12/16 0123  TSH 1.755   _____________  Dg Chest 2 View  Result Date: 11/11/2016 CLINICAL DATA:  Chest pain and shortness of Breath EXAM: CHEST  2 VIEW COMPARISON:  10/18/2015 FINDINGS: The heart size and mediastinal contours are within normal limits. Both  lungs are clear. The visualized skeletal structures are unremarkable. IMPRESSION: No active cardiopulmonary disease. Electronically Signed   By: MaInez Catalina.D.   On: 11/11/2016 19:03   Disposition   Pt is being discharged home today in good condition.  Follow-up Plans & Appointments    Follow-up Information    ChMurray HodgkinsNP Follow up.   Specialties:  Nurse Practitioner, Cardiology, Radiology Why:  11/22/15 at 10:30am - ChIgnacia Bayleys one of our nurse practitioners that works closely with Dr. JoMartiniquen his care team. Contact information: [address] [phone]      MCAlesia RichardsMD Follow up.   Specialty:  Internal Medicine Why:  Please follow-up within 1 week to monitor your diuretic regimen as well as your thyroid function. Your free T4  level was mildly elevated this admission but TSH was normal. Contact information: [address] [place] [address] [phone]        [facility] Follow up.   Why:  Take lab slip tomorrow to the [facility] lab on the first floor of the building that Dr. Doug Sou office is in over at [facility]. This is to recheck your potassium and kidney function. Contact information: [address] [place], [place] [address] [phone] (Phone) Hours: 8am-12:30pm and 1-4:30pm         Discharge Instructions    Amb Referral to Cardiac Rehabilitation    Complete by:  As directed    Diagnosis:  NSTEMI   Diet - low sodium heart healthy    Complete by:  As directed    Increase activity slowly    Complete by:  As directed    IMPORTANT! You may restart your Metformin on 11/16/16.  Please pay attention to multiple medication changes listed on this document. In particular, your metolazone (Zaroxolyn) was stopped due to low potassium level.  No driving for 1 week. No lifting over 10 lbs for 2 weeks. No sexual activity for 2 weeks. You may not return to work  until cleared at follow-up appointment. Keep procedure site clean & dry. If you notice increased pain, swelling, bleeding or pus, call/return!  You may shower, but no soaking baths/hot tubs/pools for 1 week.  Please contact your primary care provider's office to discuss plan for your vitamin D. It appears while you were in the hospital, they sent in a refill on your present dose. The most recent labwork note from Vicie Mutters, [place] on 10/23/16 said she did not want you to take this on on Sunday for 1-2 months, then restart daily.      Discharge Medications   Allergies as of 11/14/2016      Reactions   Ace Inhibitors Anaphylaxis   Tilade [nedocromil] Anaphylaxis   Reglan [metoclopramide] Nausea And Vomiting   Betadine [povidone Iodine] Rash   iching       Medication List    STOP taking these medications   metolazone 2.5 MG tablet Commonly known as:  ZAROXOLYN     TAKE these medications   albuterol 108 (90 Base) MCG/ACT inhaler Commonly known as:  VENTOLIN HFA Inhale 2 puffs into the lungs every 4 (four) hours as needed for wheezing or shortness of breath.   ALPRAZolam 1 MG tablet Commonly known as:  XANAX TAKE 1/2 TO 1 TABLET BY MOUTH 3 TIMES A DAY AS NEEDED What changed:  how much to take  how to take this  when to take this  reasons to take this  additional instructions   amitriptyline 25 MG tablet Commonly known as:  ELAVIL TAKE 1/2 - 2 TABLETS AT NIGHT FOR LEG PAIN   aspirin 81 MG EC tablet Take 1 tablet (81 mg total) by mouth daily. Start taking on:  11/15/2016   atorvastatin 40 MG tablet Commonly known as:  LIPITOR Take 1 tablet (40 mg total) by mouth every evening.   azelastine 0.1 % nasal spray Commonly known as:  ASTELIN PLACE 2 SPRAYS INTO BOTH NOSTRILS 2 (TWO) TIMES DAILY. USE IN EACH NOSTRIL AS DIRECTED   calcium citrate-vitamin D 315-200 MG-UNIT tablet Commonly known as:  CITRACAL+D Take 1 tablet by mouth 3 (three) times daily.   cetirizine  10 MG tablet Commonly known as:  ZYRTEC Take 10 mg by mouth every morning.   ciprofloxacin 250 MG  tablet Commonly known as:  CIPRO Take 1 tablet (250 mg total) by mouth 2 (two) times daily. Take 1 tablet 2 x day with food for infection   fluticasone 50 MCG/ACT nasal spray Commonly known as:  FLONASE PLACE 2 SPRAYS INTO BOTH NOSTRILS DAILY.   furosemide 40 MG tablet Commonly known as:  LASIX Take 1 tablet (40 mg total) by mouth 2 (two) times daily. What changed:  how much to take  how to take this  when to take this  additional instructions   glipiZIDE 5 MG tablet Commonly known as:  GLUCOTROL Take 1/2 to 1 tab 3 x day if needed for elevated glucose   glucose blood test strip Commonly known as:  ONE TOUCH TEST STRIPS CHECK BLOOD SUGAR 3 TIMES DAILY DUE TO POORLY CONTROLLED GLUCOSE-DX-E11.65   isosorbide mononitrate 30 MG 24 hr tablet Commonly known as:  IMDUR Take 1 tablet (30 mg total) by mouth daily. Start taking on:  11/15/2016   levothyroxine 200 MCG tablet Commonly known as:  SYNTHROID, LEVOTHROID TAKE 1/2 TO - 1 TABLETS BY MOUTH DAILY BEFORE BREAKFAST. ALTERNATE TAKING 100 AND 200MG What changed:  how much to take  how to take this  when to take this  additional instructions   lipase/protease/amylase 12000 units Cpep capsule Commonly known as:  CREON TAKE 1 CAPSULE BY MOUTH 3 TIMES DAILY.   metFORMIN 500 MG tablet Commonly known as:  GLUCOPHAGE TAKE 1-2 TABLETS BY MOUTH 3 TIMES DAILY. TAKE 1 TAB AT BREAKFAST, 1 AT LUNCH AND 2 AT DINNER Notes to patient:  !!!!!!!!!!!!!!!!!!!! IMPORTANT: You may restart your Metformin on 11/16/16.   metoprolol tartrate 25 MG tablet Commonly known as:  LOPRESSOR Take 0.5 tablets (12.5 mg total) by mouth 2 (two) times daily.   multivitamin with minerals tablet Take 1 tablet by mouth daily.   nitroGLYCERIN 0.4 MG SL tablet Commonly known as:  NITROSTAT Place 1 tablet (0.4 mg total) under the tongue every 5 (five)  minutes as needed for chest pain. Up to 3 doses   ONE TOUCH ULTRA SYSTEM KIT w/Device Kit 1 kit by Does not apply route once.   onetouch ultrasoft lancets Check blood sugar 3 times daily due to poorly controlled glucose-DX-E11.65.   OVER THE COUNTER MEDICATION Take 1 tablet by mouth daily. Calcium,magnesium and zinc in one.   potassium chloride SA 20 MEQ tablet Commonly known as:  KLOR-CON M20 Take 2 tablets (40 mEq total) by mouth 2 (two) times daily. What changed:  See the new instructions.   sitaGLIPtin 100 MG tablet Commonly known as:  JANUVIA Take 1 tablet (100 mg total) by mouth daily.   sucralfate 1 g tablet Commonly known as:  CARAFATE TAKE 1 TABLET BY MOUTH 4 TIMES DAILY.   Vitamin D (Ergocalciferol) 50000 units Caps capsule Commonly known as:  DRISDOL Take 1 capsule (50,000 Units total) by mouth daily. What changed:  See the new instructions.        Allergies:  Allergies  Allergen Reactions  . Ace Inhibitors Anaphylaxis  . Tilade [Nedocromil] Anaphylaxis  . Reglan [Metoclopramide] Nausea And Vomiting  . Betadine [Povidone Iodine] Rash    iching     Aspirin prescribed at discharge?  Yes High Intensity Statin Prescribed? (Lipitor 40-21m or Crestor 20-475m: Yes Beta Blocker Prescribed? Yes For EF <40%, was ACEI/ARB Prescribed? No: n/a ADP Receptor Inhibitor Prescribed? (i.e. Plavix etc.-Includes Medically Managed Patients): No: small vessel territory, prior anemia For EF <40%, Aldosterone Inhibitor Prescribed? No: n/a Was EF  assessed during THIS hospitalization? Yes Was Cardiac Rehab II ordered? (Included Medically managed Patients): Yes   Outstanding Labs/Studies   See above regarding recommendations.  Duration of Discharge Encounter   Greater than 30 minutes including physician time.  Signed, Charlie Pitter PA-C 11/14/2016, 2:17 PM

## 2016-11-14 NOTE — Progress Notes (Addendum)
Patient Name: Courtney Stevens Date of Encounter: 11/14/2016  Primary Cardiologist: Dr Martinique  Hospital Problem List     Principal Problem:   NSTEMI (non-ST elevated myocardial infarction) [facility]) Active Problems:   Malignant neoplasm of extrahepatic bile duct (HCC)   T2_NIDDM w/CKD2 (GFR 71 ml/min)   Essential hypertension   Hyperlipidemia   History of Roux-en-Y gastric bypass   Hypothyroidism   Hypokalemia     Subjective   No chest pain or SOB now. Cath yesterday with 20% LAD, 30% RCA and 90% ostial stenosis of small D2 - medical management.    Inpatient Medications    Scheduled Meds: . amitriptyline  25 mg Oral QHS  . aspirin EC  81 mg Oral Daily  . atorvastatin  40 mg Oral q1800  . azelastine  2 spray Each Nare BID  . ciprofloxacin  250 mg Oral BID  . fluticasone  2 spray Each Nare Daily  . insulin aspart  0-15 Units Subcutaneous TID WC  . insulin aspart  0-5 Units Subcutaneous QHS  . isosorbide mononitrate  30 mg Oral Daily  . levothyroxine  200 mcg Oral QAC breakfast  . lipase/protease/amylase  12,000 Units Oral TID WC  . loratadine  10 mg Oral Daily  . metoprolol tartrate  12.5 mg Oral BID  . potassium chloride  40 mEq Oral BID  . sodium chloride flush  3 mL Intravenous Q12H  . sucralfate  1 g Oral Q6H   Continuous Infusions:  PRN Meds: sodium chloride, acetaminophen, albuterol, ALPRAZolam, morphine injection, nitroGLYCERIN, ondansetron (ZOFRAN) IV, sodium chloride flush   Vital Signs    Vitals:   11/13/16 2037 11/14/16 0631 11/14/16 0744 11/14/16 0745  BP: 112/63 (!) 92/45 (!) 107/53 (!) 107/53  Pulse: 76 62    Resp: 12 16 17 11   Temp: 98.4 F (36.9 C) 98.1 F (36.7 C) 97.8 F (36.6 C)   TempSrc: Oral Oral Oral   SpO2: 97% 95% 97%   Weight:  160 lb 15 oz (73 kg)    Height:        Intake/Output Summary (Last 24 hours) at 11/14/16 0950 Last data filed at 11/14/16 0947  Gross per 24 hour  Intake            847.5 ml  Output             3250 ml    Net          -2402.5 ml   Filed Weights   11/12/16 0045 11/13/16 0512 11/14/16 0631  Weight: 158 lb 1.1 oz (71.7 kg) 159 lb 11.2 oz (72.4 kg) 160 lb 15 oz (73 kg)    Physical Exam    GEN: Well nourished, well developed, in no acute distress.  HEENT: Grossly normal.  Neck: Supple, no JVD, carotid bruits, or masses. Cardiac: RRR, soft murmur, no rubs, or gallops. No clubbing, cyanosis, edema.  Radials/DP/PT 2+ and equal bilaterally.  Respiratory:  Respirations regular and unlabored, clear to auscultation bilaterally. GI: Soft, nontender, nondistended, BS + x 4. MS: no deformity or atrophy. Skin: warm and dry, no rash. Neuro:  Strength and sensation are intact. Psych: AAOx3.  Normal affect.  Labs    CBC  Recent Labs  11/13/16 0537 11/14/16 0617  WBC 5.3 4.5  HGB 12.6 11.5*  HCT 36.5 35.2*  MCV 85.7 88.2  PLT 183 0000000   Basic Metabolic Panel  Recent Labs  11/12/16 1640 11/13/16 0537 11/14/16 0617  NA 134* 136  --  K 3.4* 3.9  --   CL 96* 101  --   CO2 30 27  --   GLUCOSE 120* 154*  --   BUN 18 19  --   CREATININE 1.16* 1.16*  --   CALCIUM 9.4 8.9  --   MG  --  2.1 2.1   Liver Function Tests  Recent Labs  11/11/16 1907  AST 30  ALT 26  ALKPHOS 122  BILITOT 0.9  PROT 7.8  ALBUMIN 4.7    Recent Labs  11/11/16 1907  LIPASE 33   Cardiac Enzymes  Recent Labs  11/12/16 0123 11/12/16 0702 11/12/16 1640  TROPONINI 0.25* 0.18* 0.07*   D-Dimer  Recent Labs  11/12/16 0851  DDIMER <0.27   Hemoglobin A1C No results for input(s): HGBA1C in the last 72 hours. Fasting Lipid Panel Lab Results  Component Value Date   CHOL 143 10/23/2016   HDL 50 (L) 10/23/2016   LDLCALC 55 10/23/2016   TRIG 190 (H) 10/23/2016   CHOLHDL 2.9 10/23/2016   Thyroid Function Tests  Recent Labs  11/12/16 0123  TSH 1.755    Telemetry    SR, Mostly sinus brady w/ HR high 40s (asymptomatic), rare PVCs, had a 3 bt run NSVT - Personally Reviewed  ECG    n/a  - Personally Reviewed  Radiology    No results found.  Cardiac Studies    The left ventricular systolic function is normal.  LV end diastolic pressure is mildly elevated.  The left ventricular ejection fraction is 55-65% by visual estimate.  Prox RCA lesion, 30 %stenosed.  Prox LAD to Mid LAD lesion, 20 %stenosed.  Ost 2nd Diag to 2nd Diag lesion, 90 %stenosed.   1. No evidence of obstructive disease in main coronary arteries. The culprit for non-ST elevation myocardial infarction is a 90% ostial stenosis and second diagonal which is a 2 mm in diameter and supplies a relatively small territory. 2. Normal LV systolic function and mildly elevated left ventricular end-diastolic pressure.  Recommendations: Given the ostial location of second diagonal stenosis and the fact that the vessel is only about 2 mm in diameter, I recommend medical therapy. I started the patient on small dose Imdur. Possible discharge home tomorrow.  Patient Profile     60 yo female w/ hx HTN, HLD, DM, bile duct CA, hypothyroid, admitted 12/25 w/ NSTEMI>>cath 12/27.  Assessment & Plan    1. Non-ST segment elevation myocardial infarction.  The patient presented with episode of chest pain that lasted for several hours. Her troponin levels are consistent with a non-ST segment elevation myocardial infarction. Her d-dimer is normal.  Trop trending downward.  She is a nonsmoker. Her cholesterol levels were drawn several weeks ago and are very well-controlled. She does have a very strong family history of coronary artery disease.   Cardiac catheterization showed 90% ostial stenosis of a small D2 and medical management recommended.  Continue high-dose statin, ASA, BB and long acting nitrate. Check BMET this am post cath.  2. Sinus bradycardia: tolerating low-dose BB well. With ventricular ectopy, would not hold unless HR sustained < 45.   No further recs at this time.  Will sign off.  Will need TOC appt in  7-10 days - we will schedule. No metformin until 12/30.  Signed, Fransico Him, MD  11/14/2016, 9:50 AM

## 2016-11-15 ENCOUNTER — Ambulatory Visit (INDEPENDENT_AMBULATORY_CARE_PROVIDER_SITE_OTHER): Payer: Managed Care, Other (non HMO) | Admitting: Internal Medicine

## 2016-11-15 ENCOUNTER — Other Ambulatory Visit: Payer: Self-pay | Admitting: Physician Assistant

## 2016-11-15 VITALS — BP 130/74 | HR 72 | Temp 97.2°F | Resp 16 | Ht 67.0 in | Wt 160.0 lb

## 2016-11-15 DIAGNOSIS — J0410 Acute tracheitis without obstruction: Secondary | ICD-10-CM | POA: Diagnosis not present

## 2016-11-15 DIAGNOSIS — J0140 Acute pansinusitis, unspecified: Secondary | ICD-10-CM

## 2016-11-15 LAB — BASIC METABOLIC PANEL
BUN: 15 mg/dL (ref 7–25)
CALCIUM: 9.2 mg/dL (ref 8.6–10.4)
CO2: 22 mmol/L (ref 20–31)
CREATININE: 1.14 mg/dL — AB (ref 0.50–0.99)
Chloride: 108 mmol/L (ref 98–110)
Glucose, Bld: 195 mg/dL — ABNORMAL HIGH (ref 65–99)
Potassium: 4.6 mmol/L (ref 3.5–5.3)
Sodium: 140 mmol/L (ref 135–146)

## 2016-11-15 MED ORDER — PREDNISONE 20 MG PO TABS: ORAL_TABLET | ORAL | 0 refills | Status: DC

## 2016-11-15 MED ORDER — AZITHROMYCIN 250 MG PO TABS: ORAL_TABLET | ORAL | 1 refills | Status: DC

## 2016-11-15 NOTE — Progress Notes (Signed)
[facility]   Unk Pinto, M.D.    Uvaldo Bristle. Silverio Lay, P.A.-C      Starlyn Skeans, P.A.-C  [facility]                [address] [place], [place] [id number] Telephone [phone] Telefax [phone]  Subjective:    Patient ID: Courtney Stevens, female    DOB: May 22, 1956, 60 y.o.   MRN: [id number]  HPI  This very nice 60 yo with HT , T2_DM, indolent metastatic Klatkin Bile Duct Cancer (2009) was hospitalized 12/25-28  For an acute NSTEMI - treating medically and home now 1 day presenting with c/o of a cough productive of a greenish sputum. New meds include Isorbide and Metoprolol with Metformin being held until 12/30 for recent iv contrast load. Patient denies fever, chills, sweats, rash or dyspnea.   Medication Sig  . Albuterol HFA inhaler 2 puffs every 4  hours as needed   . ALPRAZolam1 MG tablet Take 0.5-1 TAb 3  times daily as needed for anxieTY  . amitriptyline 25 MG  TAKE 1/2 - 2 TABLETS AT NIGHT FOR LEG PAIN  . aspirin EC 81 MG EC  Take 1 tab daily.  Marland Kitchen atorvastatin  40 MG  Take 1 tabevery evening.  . ASTELIN 0.1 % nasal  PLACE 2 SPRAYS 2  TIMES DAILY  . CITRACAL+D 315-200 MG-U Take 1 tab 3  times daily.  . cetirizine  10 MG tablet Take 1 every morning.    Marland Kitchen FLONASE  nasal spray PLACE 2 SPRAYS INTO BOTH NOSTRILS DAILY.  . furosemide 40 MG Take 1 tab 2 (two) times daily.  Marland Kitchen glipiZIDE 5 MG  Take 1/2 to 1 tab 3 x day if needed for elevated glucose  . isosorbide mononitrate (IMDUR) 30 MG  Take 1 tab daily.  Marland Kitchen levothyroxine  200 MCG Take daily before breakfast.   . CREON),000 units  TAKE 1 CAP 3 TIMES DAILY.  . metFORMIN  500 MG TAKE 1 TAB AT BKST, 1 AT LUNCH & 2 AT [facility]  . metoprolol tartrate 25 MG Take 0.5 tab(12.5 mg) 2times daily.  . Multi-Vit-Min Take 1 tab daily.   Marland Kitchen NITROSTAT 0.4 MG SL  as needed for chest pain  . Calcium,magnesium and zinc Take 1 tab daily  . KLOR-CON  20 MEQ  Take 2  tab (40 mEq)  2  times daily.  . sitaGLIPtin  100 MG Take 1 tab daily.  . sucralfate 1 g TAKE 1 TABLET BY MOUTH 4 TIMES DAILY.  Marland Kitchen Vitamin D 50,000 units  Take 1 capsule (50,000 Units total) by mouth daily.   Allergies  Allergen Reactions  . Ace Inhibitors Anaphylaxis  . Tilade [Nedocromil] Anaphylaxis  . Reglan [Metoclopramide] Nausea And Vomiting  . Betadine [Povidone Iodine] Rash    iching    Past Medical History:  Diagnosis Date  . Anemia    History of blood transfusion which is when pt reports being exposed to hepatitis in 2009.  Marland Kitchen Anxiety   . Asthma   . CAD (coronary artery disease)    a. [facility] 11/13/16 showed EF 55-65%, 40% prox RCA, 20% prox-mid LAD, 90% D2 lesion. The culprit for the NSTEMI appeared to be the 90% ostial stenosis in second diagonal which is a 2 mm in diameter and supplies a relatively small territory. Managed medically.  . Cancer (  [place])    Klatkin bile duct ca (2009)   . CKD (chronic kidney disease), stage III   . Diabetes mellitus   . GERD (gastroesophageal reflux disease)   . H/O gastric bypass   . Hepatitis   . Hyperlipidemia   . Hypertension   . Hypothyroid   . NSTEMI (non-ST elevated myocardial infarction) ([place]) 10/2016  . PVC's (premature ventricular contractions)    Review of Systems  10 point systems review negative except as above.    Objective:   Physical Exam  BP 130/74   Pulse 72   Temp 97.2 F (36.2 C)   Resp 16   Ht 5\' 7"  (1.702 m)   Wt 160 lb (72.6 kg)   BMI 25.06 kg/m   Congested cough. No Stridor. Skin clear  W/o Cyanosis, Icterus or clubbing.  HEENT - Eac's patent. TM's Nl. EOM's full. PERRLA. Bilat frontomaxillary tenderness. NasoOroPharynx clear. Neck - supple. Nl Thyroid. Carotids 2+ & No bruits, nodes, JVD Chest - Few scattered rales & rhonchi clearing partially w/cough and no wheezing.  Cor - Nl HS. RRR w/o sig MGR. MS- FROM. Gait Nl. Neuro - Nl w/o focal abnormalities.    Assessment & Plan:   1. Tracheitis   2.  Acute pansinusitis, recurrence not specified  - predniSONE (DELTASONE) 20 MG tablet; 1 tab 3 x day for 3 days, then 1 tab 2 x day for 3 days, then 1 tab 1 x day for 5 days  Dispense: 20 tablet; Refill: 0 - azithromycin (ZITHROMAX) 250 MG tablet; Take 2 tablets (500 mg) on  Day 1,  followed by 1 tablet (250 mg) once daily on Days 2 through 5.  Dispense: 6 each; Refill: 1  - discussed meds & SE's. ROV - prn.

## 2016-11-16 ENCOUNTER — Other Ambulatory Visit: Payer: Self-pay | Admitting: Internal Medicine

## 2016-11-19 ENCOUNTER — Telehealth (HOSPITAL_COMMUNITY): Payer: Self-pay | Admitting: Cardiac Rehabilitation

## 2016-11-19 ENCOUNTER — Ambulatory Visit: Payer: Managed Care, Other (non HMO) | Admitting: Podiatry

## 2016-11-19 NOTE — Telephone Encounter (Signed)
pc to pt to discuss enrolling in cardiac rehab. Pt declined due to work schedule.

## 2016-11-21 ENCOUNTER — Other Ambulatory Visit: Payer: Self-pay | Admitting: *Deleted

## 2016-11-21 ENCOUNTER — Ambulatory Visit (INDEPENDENT_AMBULATORY_CARE_PROVIDER_SITE_OTHER): Payer: Managed Care, Other (non HMO) | Admitting: Nurse Practitioner

## 2016-11-21 ENCOUNTER — Encounter: Payer: Self-pay | Admitting: Nurse Practitioner

## 2016-11-21 VITALS — BP 132/86 | HR 66 | Ht 67.0 in | Wt 152.0 lb

## 2016-11-21 DIAGNOSIS — E876 Hypokalemia: Secondary | ICD-10-CM | POA: Diagnosis not present

## 2016-11-21 DIAGNOSIS — N183 Chronic kidney disease, stage 3 unspecified: Secondary | ICD-10-CM

## 2016-11-21 DIAGNOSIS — I25118 Atherosclerotic heart disease of native coronary artery with other forms of angina pectoris: Secondary | ICD-10-CM | POA: Diagnosis not present

## 2016-11-21 DIAGNOSIS — I214 Non-ST elevation (NSTEMI) myocardial infarction: Secondary | ICD-10-CM | POA: Diagnosis not present

## 2016-11-21 DIAGNOSIS — I10 Essential (primary) hypertension: Secondary | ICD-10-CM | POA: Diagnosis not present

## 2016-11-21 DIAGNOSIS — E782 Mixed hyperlipidemia: Secondary | ICD-10-CM

## 2016-11-21 LAB — BASIC METABOLIC PANEL
BUN: 47 mg/dL — ABNORMAL HIGH (ref 7–25)
CO2: 29 mmol/L (ref 20–31)
Calcium: 10.4 mg/dL (ref 8.6–10.4)
Chloride: 81 mmol/L — ABNORMAL LOW (ref 98–110)
Creat: 1.77 mg/dL — ABNORMAL HIGH (ref 0.50–0.99)
GLUCOSE: 497 mg/dL — AB (ref 65–99)
POTASSIUM: 3.8 mmol/L (ref 3.5–5.3)
SODIUM: 129 mmol/L — AB (ref 135–146)

## 2016-11-21 NOTE — Progress Notes (Signed)
Office Visit    Patient Name: Courtney Stevens Date of Encounter: 11/21/2016  Primary Care Provider:  [name], MD Primary Cardiologist:  P. Martinique, MD   Chief Complaint    61 year old female status post recent non-STEMI who presents for post hospital follow-up.  Past Medical History    Past Medical History:  Diagnosis Date  . Anemia    History of blood transfusion which is when pt reports being exposed to hepatitis in 2009.  Marland Kitchen Anxiety   . Asthma   . CAD (coronary artery disease)    a. 03/2016 St Echo: nl EF, no wma-->Nl study;  b. 11/13/16 NSTEMI/Cath: EF 55-65%, 40% prox RCA, 20% prox-mid LAD, 90% D2 lesion. The culprit for the NSTEMI appeared to be the 90% ostial stenosis in second diagonal which is a 2 mm in diameter and supplies a relatively small territory. Managed medically.  . Cancer ([place])    [place] bile duct ca (2009)   . CKD (chronic kidney disease), stage III   . Diabetes mellitus   . GERD (gastroesophageal reflux disease)   . H/O gastric bypass   . Hepatitis   . Hyperlipidemia   . Hypertension   . Hypothyroid   . PVC's (premature ventricular contractions)    Past Surgical History:  Procedure Laterality Date  . ABDOMINAL HYSTERECTOMY  1990   BIL OOPHORECTOMY  . APPENDECTOMY    . Bile Duct Resection  2009  . BREAST SURGERY     BIOPSY NEG  . BUNIONECTOMY Right 2005  . CARDIAC CATHETERIZATION N/A 11/13/2016   Procedure: Left Heart Cath and Coronary Angiography;  Surgeon: Wellington Hampshire, MD;  Location: [facility];  Service: Cardiovascular;  Laterality: N/A;  . CHOLECYSTECTOMY  1992  . GASTRIC BYPASS  2004  . TONSILLECTOMY    . TUBAL LIGATION    . VIDEO BRONCHOSCOPY WITH ENDOBRONCHIAL ULTRASOUND N/A 10/12/2014   Procedure: VIDEO BRONCHOSCOPY WITH ENDOBRONCHIAL ULTRASOUND;  Surgeon: Brand Males, MD;  Location: [facility];  Service: Thoracic;  Laterality: N/A;    Allergies  Allergies  Allergen Reactions  . Ace Inhibitors Anaphylaxis   . Tilade [Nedocromil] Anaphylaxis  . Reglan [Metoclopramide] Nausea And Vomiting  . 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 She also has a previous history of chest pain and underwent stress echocardiography in May of this year, which was normal. She was recently admitted to [facility] on December 25 following presentation to [facility] with chest pressure and troponin elevation with a peak troponin of 0.25. She was also noted to be significantly hypokalemic and required multiple rounds of supplementation. Should went diagnostic catheterization on December 27 revealing severe ostial disease in a small second diagonal vessel. She otherwise had nonobstructive disease. LV function was normal. The diagonal was only a 2 mm vessel and was not felt to be amenable to PCI. Medical therapy was recommended and she was placed on aspirin, statin, beta blocker, and nitrate therapy. Potassium normalized with adjustment of home regimen and discontinuation of Zaroxolyn, which she was taking less than once a week previously. Follow-up basic metabolic panel was performed as an outpatient on December 29 and showed a potassium of 4.6 with normal renal function.  Since her discharge, she has  felt very fatigued with just generally no energy. She has had occasional low level substernal chest discomfort occurring with mild activity. This has not been particularly bothersome or long lasting, and she has not had to take any sublingual nitroglycerin tablets. She denies dyspnea, palpitations, PND, orthopnea, dizziness, syncope, edema, or early satiety.  Home Medications    Prior to Admission medications   Medication Sig Start Date End  Date Taking? Authorizing Provider  albuterol (VENTOLIN HFA) 108 (90 BASE) MCG/ACT inhaler Inhale 2 puffs into the lungs every 4 (four) hours as needed for wheezing or shortness of breath. 09/08/15  Yes Vicie Mutters, PA-C  ALPRAZolam (XANAX) 1 MG tablet TAKE 1/2 TO 1 TABLET BY MOUTH 3 TIMES A DAY AS NEEDED Patient taking differently: Take 0.5-1 mg by mouth 3 (three) times daily as needed for anxiety.  07/02/16  Yes Unk Pinto, MD  amitriptyline (ELAVIL) 25 MG tablet TAKE 1/2 - 2 TABLETS AT NIGHT FOR LEG PAIN 11/06/16  Yes Unk Pinto, MD  aspirin EC 81 MG EC tablet Take 1 tablet (81 mg total) by mouth daily. 11/15/16  Yes Dayna N Dunn, PA-C  atorvastatin (LIPITOR) 40 MG tablet Take 1 tablet (40 mg total) by mouth every evening. 11/14/16  Yes Dayna N Dunn, PA-C  azelastine (ASTELIN) 0.1 % nasal spray PLACE 2 SPRAYS INTO BOTH NOSTRILS 2 (TWO) TIMES DAILY. USE IN EACH NOSTRIL AS DIRECTED 06/29/16  Yes Unk Pinto, MD  Blood Glucose Monitoring Suppl (ONE TOUCH ULTRA SYSTEM KIT) w/Device KIT 1 kit by Does not apply route once. 01/08/16  Yes Unk Pinto, MD  calcium citrate-vitamin D (CITRACAL+D) 315-200 MG-UNIT per tablet Take 1 tablet by mouth 3 (three) times daily.   Yes Historical Provider, MD  cetirizine (ZYRTEC) 10 MG tablet Take 10 mg by mouth every morning.     Yes Historical Provider, MD  fluticasone (FLONASE) 50 MCG/ACT nasal spray PLACE 2 SPRAYS INTO BOTH NOSTRILS DAILY. 10/15/16  Yes Brand Males, MD  furosemide (LASIX) 40 MG tablet Take 1 tablet (40 mg total) by mouth 2 (two) times daily. 11/14/16  Yes Dayna N Dunn, PA-C  glipiZIDE (GLUCOTROL) 5 MG tablet Take 1/2 to 1 tab 3 x day if needed for elevated glucose 07/02/16  Yes Unk Pinto, MD  glucose blood (ONE TOUCH TEST STRIPS) test strip CHECK BLOOD SUGAR 3 TIMES DAILY DUE TO POORLY CONTROLLED GLUCOSE-DX-E11.65 01/08/16  Yes Unk Pinto, MD  isosorbide mononitrate (IMDUR) 30 MG 24 hr tablet Take 1 tablet (30 mg total)  by mouth daily. 11/15/16  Yes Dayna N Dunn, PA-C  Lancets (ONETOUCH ULTRASOFT) lancets Check blood sugar 3 times daily due to poorly controlled glucose-DX-E11.65. 01/08/16  Yes Unk Pinto, MD  levothyroxine (SYNTHROID, LEVOTHROID) 200 MCG tablet TAKE 1/2 TO - 1 TABLETS BY MOUTH DAILY BEFORE BREAKFAST. ALTERNATE TAKING 100 AND 200MG Patient taking differently: Take 200 mcg by mouth daily before breakfast.  07/02/16  Yes Unk Pinto, MD  lipase/protease/amylase (CREON) 12000 units CPEP capsule TAKE 1 CAPSULE BY MOUTH 3 TIMES DAILY. 07/02/16  Yes Unk Pinto, MD  metFORMIN (GLUCOPHAGE) 500 MG tablet TAKE 1-2 TABLETS BY MOUTH 3 TIMES DAILY. TAKE 1 TAB AT BREAKFAST, 1 AT LUNCH AND 2 AT [facility] 07/02/16  Yes Unk Pinto, MD  metoprolol tartrate (LOPRESSOR) 25 MG tablet Take 0.5 tablets (12.5 mg total) by mouth 2 (two) times daily. 11/14/16  Yes Dayna N Dunn, PA-C  Multiple Vitamins-Minerals (MULTIVITAMIN WITH MINERALS) tablet Take 1 tablet by mouth daily.    Yes Historical  Provider, MD  nitroGLYCERIN (NITROSTAT) 0.4 MG SL tablet Place 1 tablet (0.4 mg total) under the tongue every 5 (five) minutes as needed for chest pain. Up to 3 doses 11/14/16  Yes Dayna N Dunn, PA-C  OVER THE COUNTER MEDICATION Take 1 tablet by mouth daily. Calcium,magnesium and zinc in one.   Yes Historical Provider, MD  potassium chloride SA (KLOR-CON M20) 20 MEQ tablet Take 2 tablets (40 mEq total) by mouth 2 (two) times daily. 11/14/16  Yes Dayna N Dunn, PA-C  predniSONE (DELTASONE) 20 MG tablet 1 tab 3 x day for 3 days, then 1 tab 2 x day for 3 days, then 1 tab 1 x day for 5 days 11/15/16  Yes Unk Pinto, MD  promethazine-dextromethorphan (PROMETHAZINE-DM) 6.25-15 MG/5ML syrup TAKE 1 TO 2 TEAPSOONS BY MOUTH 4 TIMES A DAY OR EVERY 4 HOURS IF NEEDED FOR COUGH 11/16/16  Yes Unk Pinto, MD  sitaGLIPtin (JANUVIA) 100 MG tablet Take 1 tablet (100 mg total) by mouth daily. 07/02/16  Yes Unk Pinto, MD  sucralfate  (CARAFATE) 1 g tablet TAKE 1 TABLET BY MOUTH 4 TIMES DAILY. 07/02/16  Yes Unk Pinto, MD  Vitamin D, Ergocalciferol, (DRISDOL) 50000 units CAPS capsule Take 1 capsule (50,000 Units total) by mouth daily. 11/13/16  Yes Unk Pinto, MD    Review of Systems    Since discharge from the hospital, she has not had much energy. She thinks this may be related to her new medications. She has had mild intermittent exertional chest discomfort. She denies dyspnea, palpitations, PND, orthopnea, dizziness, syncope, edema, or early satiety. All other systems reviewed and are otherwise negative except as noted above.  Physical Exam    VS:  BP 132/86   Pulse 66   Ht '5\' 7"'  (1.702 m)   Wt 152 lb (68.9 kg)   BMI 23.81 kg/m  , BMI Body mass index is 23.81 kg/m. GEN: Well nourished, well developed, in no acute distress.  HEENT: normal.  Neck: Supple, no JVD, carotid bruits, or masses. Cardiac: RRR, no murmurs, rubs, or gallops. No clubbing, cyanosis, edema.  Radials/DP/PT 2+ and equal bilaterally. Right wrist catheterization site is mildly ecchymotic without bleeding, bruit, or hematoma. Respiratory:  Respirations regular and unlabored, clear to auscultation bilaterally. GI: Soft, nontender, nondistended, BS + x 4. MS: no deformity or atrophy. Skin: warm and dry, no rash. Neuro:  Strength and sensation are intact. Psych: Normal affect.  Accessory Clinical Findings    ECG - Regular sinus rhythm, 62, inferior and anterolateral T-wave inversion, which is more pronounced than on admission ECGs.  Assessment & Plan    1.  Non-ST segment elevation myocardial infarction, subsequent episode of care/CAD:  S/p recent admission for c/p and troponin elevation.  Cath revealed severe, ostial, small vessel diagonal dzs (2.0 mm), which was not felt to be amenable to PCI.  Otw non-obstructive dzs and nl LV fxn.  Med Rx initiated with  blocker, asa, statin, and long acting nitrate.  Since d/c, she has had periodic,  low level, exertional chest discomfort but has not had to take any sl ntg. She c/o significant fatigue and lack of energy.  In discussing her meds with her, she is supposed to be taking lopressor 12.5 bid, but it appears she has been taking 25 bid (she was brady @ times in the hospital).  I suspect  blocker may be contributing to fatigue and have asked her to 1/2 her dose to what was Rx @ discharge.  I further  recommended that if after 5-7 days of 12.5 bid, she continues to note significant fatigue, she may come off of it to see if fatigue resolves.  I will see her back in 2 wks to readdress.  If fatigue has resolved at that point and she continues to have some amt of chest discomfort, I would then titrate imdur to 60 mg daily.  2.  Essential HTN:  BP stable today.  Cont  blocker (@ lower dose as noted above) and nitrate.  3.  HL:  LDL was 55 12/6.  She is on lipitor and currently tolerating.  Will plan to f/u lipids/lft's in 6 wks.  4.  Hypokalemia:  She was hypokalemic on admission - 2.8 - and required frequent supplementation.  Zaroxolyn, which it doesn't sound like she was taking very often at home, was d/c'd and K normalized.  K was 4.6 on 12/29.  I will f/u today to assess stability.  She may not need quite so much supplementation (currently 40 bid).  5. DM II:  Followed by primary care.  On glipizide, januvia, and metformin.  A1c 8.3 10/23/16.  6. CKD III: creat stable throughout recent admission.  7.  Disposistion:  F/u K today.  F/u in clinic in 2 wks.  Murray Hodgkins, NP 11/21/2016, 11:09 AM

## 2016-11-21 NOTE — Patient Outreach (Signed)
[facility]) Care Management  11/21/2016  ANNALECIA DERIAN Dec 17, 1955 [id number]  Per Wilhemina Cash at [place], patient being referred to [facility] CM, no [facility] Care Management follow up needed, and RNCM will proceed with case closure.   RNCM will send case closure due to other CM program request to [facility].     Myla Mauriello H. Annia Friendly, BSN, [facility] Telephonic CM Phone: [phone] Fax: [phone]

## 2016-11-21 NOTE — Patient Instructions (Addendum)
Medication Instructions:  START TAKING YOUR METOPROLOL 25 MG 1/2 TABLET TWICE A DAY   Labwork: BMET AT [facility]  Testing/Procedures: NONE  Follow-Up: Your physician recommends that you schedule a follow-up appointment in: 2 [place] [facility] PA 12/05/16 at 9:15 arrive at 9:00  If you need a refill on your cardiac medications before your next appointment, please call your pharmacy.

## 2016-11-22 ENCOUNTER — Other Ambulatory Visit: Payer: Self-pay | Admitting: *Deleted

## 2016-11-22 ENCOUNTER — Telehealth: Payer: Self-pay | Admitting: Nurse Practitioner

## 2016-11-22 DIAGNOSIS — Z79899 Other long term (current) drug therapy: Secondary | ICD-10-CM

## 2016-11-22 NOTE — Telephone Encounter (Signed)
Received critical alert lab from [facility]- Glucose 497  Per chart review, Ignacia Bayley NP aware and Karin Lieu addressed.    Notes Recorded by Theodore Demark, RN on 11/22/2016 at 8:33 AM EST Results released to [facility] by provider. I called patient and discussed recommendations regarding changes to medications, labwork recheck, and follow up w PCP. Advised follow up as scheduled unless new instructions based on labwork findings next week. She expressed thanks and acknowledgment of instructions. All questions addressed at this time. She is aware to call if new concerns. ------  Notes Recorded by Rogelia Mire, NP on 11/22/2016 at 8:07 AM EST Her potassium is fine however her bun and creat are elevated above prior baseline suggesting that she is dry. This might explain some of her fatigue and malaise. Na is also low - likely 2/2 diuresis. Please have her hold her lasix for two days and then resume at 40 mg daily on Sunday. F/u bmet early next week. Reduce Kdur to 40 daily - would continue to take even when holding lasix given h/o hypokalemia. Glucose was also very high yesterday. This will need to be addressed with PCP.

## 2016-11-25 ENCOUNTER — Telehealth: Payer: Self-pay | Admitting: Nurse Practitioner

## 2016-11-25 DIAGNOSIS — Z79899 Other long term (current) drug therapy: Secondary | ICD-10-CM

## 2016-11-25 NOTE — Telephone Encounter (Signed)
New Message    Pt c/o medication issue:  1. Name of Medication: Lipitor   2. How are you currently taking this medication (dosage and times per day)? 1x daily  3. Are you having a reaction (difficulty breathing--STAT)? no  4. What is your medication issue? Medication makes her tired and crave grapefruit

## 2016-11-26 NOTE — Telephone Encounter (Signed)
Returned call to patient-patient states her fatigue and lack of energy has continued since OV with Ignacia Bayley NP on 1/4.  Patient states she continues to feel fatigued, dizzy with movement, lightheaded, has a decreased appetite, reports nothing taste good and her mouth continues to stay dry.  Reports symptoms are not any worse than at OV, just have not resolved any with medication change.  Per OV note on 1/4:     "She c/o significant fatigue and lack of energy.  In discussing her meds with her, she is supposed to be taking lopressor 12.5 bid, but it appears she has been taking 25 bid (she was brady @ times in the hospital).  I suspect ? blocker may be contributing to fatigue and have asked her to 1/2 her dose to what was Rx @ discharge.  I further recommended that if after 5-7 days of 12.5 bid, she continues to note significant fatigue, she may come off of it to see if fatigue resolves.  I will see her back in 2 wks to readdress.  If fatigue has resolved at that point and she continues to have some amt of chest discomfort, I would then titrate imdur to 60 mg daily."   Advised to follow instructions per Ignacia Bayley PA-stop lopressor to see if fatigue resolves.  Pt reports she does not monitor BP or HR at home as she does not have a cuff.  Advised to try to eat and stay hydrated as much as possible (reports she drinks water all the time but does not eat as nothing taste good).     Routed to NP for further recommendations.

## 2016-11-26 NOTE — Telephone Encounter (Signed)
Her BUN/creat were elevated on 1/4.  I suspect mild dehydration is likely playing a larger role right now.  Please have her hold lasix.  She may hold lopressor as well, though I think overdiuresis may be the issue.  Hold KCl while holding lasix.  She was to have a f/u bmet @ some point this week - can she please have it tomorrow?  She may hold lopressor as well, though I think overdiuresis may be the issue.

## 2016-11-26 NOTE — Telephone Encounter (Signed)
Returned call to patient-made aware of NP recommendations:   Rogelia Mire, NP   3:36 PM  Note    Her BUN/creat were elevated on 1/4.  I suspect mild dehydration is likely playing a larger role right now.  Please have her hold lasix.  She may hold lopressor as well, though I think overdiuresis may be the issue.  Hold KCl while holding lasix.  She was to have a f/u bmet @ some point this week - can she please have it tomorrow?  She may hold lopressor as well, though I think overdiuresis may be the issue.       Pt verbalized understanding and aware to have blood work completed tomorrow.  Lab order placed.

## 2016-11-27 ENCOUNTER — Other Ambulatory Visit: Payer: Self-pay | Admitting: Internal Medicine

## 2016-11-28 ENCOUNTER — Emergency Department (HOSPITAL_COMMUNITY)
Admission: EM | Admit: 2016-11-28 | Discharge: 2016-11-28 | Disposition: A | Discharge status: Home / Self Care | Payer: Managed Care, Other (non HMO) | Attending: Emergency Medicine | Admitting: Emergency Medicine

## 2016-11-28 ENCOUNTER — Telehealth: Payer: Self-pay | Admitting: Nurse Practitioner

## 2016-11-28 ENCOUNTER — Encounter (HOSPITAL_COMMUNITY): Payer: Self-pay

## 2016-11-28 ENCOUNTER — Ambulatory Visit (INDEPENDENT_AMBULATORY_CARE_PROVIDER_SITE_OTHER): Payer: 59 | Admitting: Physician Assistant

## 2016-11-28 ENCOUNTER — Encounter: Payer: Self-pay | Admitting: Physician Assistant

## 2016-11-28 VITALS — BP 124/72 | HR 72 | Temp 97.2°F | Resp 14 | Ht 67.0 in | Wt 166.4 lb

## 2016-11-28 DIAGNOSIS — R531 Weakness: Secondary | ICD-10-CM | POA: Diagnosis not present

## 2016-11-28 DIAGNOSIS — E1165 Type 2 diabetes mellitus with hyperglycemia: Secondary | ICD-10-CM | POA: Insufficient documentation

## 2016-11-28 DIAGNOSIS — Z8509 Personal history of malignant neoplasm of other digestive organs: Secondary | ICD-10-CM | POA: Diagnosis not present

## 2016-11-28 DIAGNOSIS — R5381 Other malaise: Secondary | ICD-10-CM | POA: Diagnosis not present

## 2016-11-28 DIAGNOSIS — R739 Hyperglycemia, unspecified: Secondary | ICD-10-CM

## 2016-11-28 DIAGNOSIS — Z79899 Other long term (current) drug therapy: Secondary | ICD-10-CM | POA: Diagnosis not present

## 2016-11-28 DIAGNOSIS — E1310 Other specified diabetes mellitus with ketoacidosis without coma: Secondary | ICD-10-CM | POA: Diagnosis not present

## 2016-11-28 DIAGNOSIS — E1122 Type 2 diabetes mellitus with diabetic chronic kidney disease: Secondary | ICD-10-CM | POA: Insufficient documentation

## 2016-11-28 DIAGNOSIS — E039 Hypothyroidism, unspecified: Secondary | ICD-10-CM | POA: Insufficient documentation

## 2016-11-28 DIAGNOSIS — I2510 Atherosclerotic heart disease of native coronary artery without angina pectoris: Secondary | ICD-10-CM | POA: Diagnosis not present

## 2016-11-28 DIAGNOSIS — Z7982 Long term (current) use of aspirin: Secondary | ICD-10-CM | POA: Diagnosis not present

## 2016-11-28 DIAGNOSIS — J45909 Unspecified asthma, uncomplicated: Secondary | ICD-10-CM | POA: Insufficient documentation

## 2016-11-28 DIAGNOSIS — Z87891 Personal history of nicotine dependence: Secondary | ICD-10-CM | POA: Diagnosis not present

## 2016-11-28 DIAGNOSIS — N183 Chronic kidney disease, stage 3 (moderate): Secondary | ICD-10-CM | POA: Insufficient documentation

## 2016-11-28 DIAGNOSIS — I214 Non-ST elevation (NSTEMI) myocardial infarction: Secondary | ICD-10-CM | POA: Diagnosis not present

## 2016-11-28 DIAGNOSIS — I129 Hypertensive chronic kidney disease with stage 1 through stage 4 chronic kidney disease, or unspecified chronic kidney disease: Secondary | ICD-10-CM | POA: Diagnosis not present

## 2016-11-28 DIAGNOSIS — E1110 Type 2 diabetes mellitus with ketoacidosis without coma: Secondary | ICD-10-CM

## 2016-11-28 LAB — BASIC METABOLIC PANEL
Anion gap: 12 (ref 5–15)
BUN: 26 mg/dL — AB (ref 7–25)
BUN: 22 mg/dL — ABNORMAL HIGH (ref 6–20)
CALCIUM: 8.8 mg/dL (ref 8.6–10.4)
CHLORIDE: 91 mmol/L — AB (ref 98–110)
CO2: 27 mmol/L (ref 20–31)
CO2: 23 mmol/L (ref 22–32)
CREATININE: 1.45 mg/dL — AB (ref 0.50–0.99)
Calcium: 9.3 mg/dL (ref 8.9–10.3)
Chloride: 99 mmol/L — ABNORMAL LOW (ref 101–111)
Creatinine, Ser: 1.35 mg/dL — ABNORMAL HIGH (ref 0.44–1.00)
GFR calc Af Amer: 48 mL/min — ABNORMAL LOW (ref >60)
GFR calc non Af Amer: 41 mL/min — ABNORMAL LOW (ref >60)
GLUCOSE: 576 mg/dL — AB (ref 65–99)
Glucose, Bld: 288 mg/dL — ABNORMAL HIGH (ref 65–99)
Potassium: 3.9 mmol/L (ref 3.5–5.3)
Potassium: 3.5 mmol/L (ref 3.5–5.1)
Sodium: 128 mmol/L — ABNORMAL LOW (ref 135–146)
Sodium: 134 mmol/L — ABNORMAL LOW (ref 135–145)

## 2016-11-28 LAB — CBG MONITORING, ED: Glucose-Capillary: 217 mg/dL — ABNORMAL HIGH (ref 65–99)

## 2016-11-28 LAB — CBC
HCT: 36.7 % (ref 36.0–46.0)
Hemoglobin: 12.6 g/dL (ref 12.0–15.0)
MCH: 29 pg (ref 26.0–34.0)
MCHC: 34.3 g/dL (ref 30.0–36.0)
MCV: 84.4 fL (ref 78.0–100.0)
Platelets: 234 10*3/uL (ref 150–400)
RBC: 4.35 MIL/uL (ref 3.87–5.11)
RDW: 12.9 % (ref 11.5–15.5)
WBC: 13.8 10*3/uL — ABNORMAL HIGH (ref 4.0–10.5)

## 2016-11-28 LAB — URINALYSIS, ROUTINE W REFLEX MICROSCOPIC
Bacteria, UA: NONE SEEN
Bilirubin Urine: NEGATIVE
Glucose, UA: >=500 mg/dL — AB
Hgb urine dipstick: NEGATIVE
Ketones, ur: NEGATIVE mg/dL
Leukocytes, UA: NEGATIVE
Nitrite: NEGATIVE
Protein, ur: NEGATIVE mg/dL
Specific Gravity, Urine: 1.022 (ref 1.005–1.030)
pH: 5 (ref 5.0–8.0)

## 2016-11-28 LAB — TROPONIN I: Troponin I: <0.03 ng/mL (ref <0.03)

## 2016-11-28 MED ORDER — SODIUM CHLORIDE 0.9 % IV BOLUS (SEPSIS)
1000.0000 mL | Freq: Once | INTRAVENOUS | Status: AC
  Administered 2016-11-28: 1000 mL via INTRAVENOUS

## 2016-11-28 MED ORDER — INSULIN ASPART 100 UNIT/ML ~~LOC~~ SOLN
5.0000 [IU] | Freq: Once | SUBCUTANEOUS | Status: AC
  Administered 2016-11-28: 5 [IU] via INTRAVENOUS
  Filled 2016-11-28: qty 1

## 2016-11-28 NOTE — ED Provider Notes (Signed)
[facility] DEPT Provider Note   CSN: [id number] Arrival date & time: 11/28/16  1042   By signing my name below, I, Evelene Croon, attest that this documentation has been prepared under the direction and in the presence of Virgel Manifold, MD . Electronically Signed: Evelene Croon, Scribe. 11/28/2016. 1:11 PM.   History   Chief Complaint Chief Complaint  Patient presents with  . Hyperglycemia  . Weakness    The history is provided by the patient. No language interpreter was used.    HPI Comments:  Courtney Stevens is a 61 y.o. female who presents to the Emergency Department complaining of elevated blood sugars x 1-2 weeks. Her blood sugar BS was 572 this AM. She is complaint with her Metformin, glipizide and januvia; no recent change in meds. Pt reports associated generalized weakness, dizziness, cotton mouth, increased thirst, and polyuria since discharge following MI on 11/11/16. She states no stents were placed at that time and she has since been taken off diuretics and lopressor. No alleviating factors noted.    Past Medical History:  Diagnosis Date  . Anemia    History of blood transfusion which is when pt reports being exposed to hepatitis in 2009.  Marland Kitchen Anxiety   . Asthma   . CAD (coronary artery disease)    a. 03/2016 St Echo: nl EF, no wma-->Nl study;  b. 11/13/16 NSTEMI/Cath: EF 55-65%, 40% prox RCA, 20% prox-mid LAD, 90% D2 lesion. The culprit for the NSTEMI appeared to be the 90% ostial stenosis in second diagonal which is a 2 mm in diameter and supplies a relatively small territory. Managed medically.  . Cancer ([place])    Klatkin bile duct ca (2009)   . CKD (chronic kidney disease), stage III   . Diabetes mellitus   . GERD (gastroesophageal reflux disease)   . H/O gastric bypass   . Hepatitis   . Hyperlipidemia   . Hypertension   . Hypothyroid   . PVC's (premature ventricular contractions)     Patient Active Problem List   Diagnosis Date Noted  . CKD (chronic  kidney disease), stage III 11/14/2016  . CAD in native artery 11/14/2016  . PVC's (premature ventricular contractions) 11/14/2016  . NSTEMI (non-ST elevated myocardial infarction) ([place]) 11/12/2016  . Hypokalemia 11/12/2016  . Chest pain with moderate risk for cardiac etiology 03/25/2016  . BMI 25.0-25.9,adult 10/16/2015  . GERD 07/23/2015  . Vitamin D deficiency 03/24/2014  . Medication management 03/24/2014  . 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 Hypothyroidism 11/25/2013  . Essential hypertension 11/03/2013  . Hyperlipidemia 11/03/2013  . 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 Multiple pulmonary nodules 03/06/2013  . Personal history of colonic polyps 03/03/2013  . 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 Type II diabetes mellitus with renal manifestations  04/03/2010  . PANCREATIC INSUFFICIENCY 04/03/2010    Past Surgical History:  Procedure Laterality Date  . ABDOMINAL HYSTERECTOMY  1990   BIL OOPHORECTOMY  . APPENDECTOMY    . Bile Duct Resection  2009  . BREAST SURGERY     BIOPSY NEG  . BUNIONECTOMY Right 2005  . CARDIAC CATHETERIZATION N/A 11/13/2016   Procedure: Left Heart Cath and Coronary Angiography;  Surgeon: Wellington Hampshire, MD;  Location: [facility];  Service: Cardiovascular;  Laterality: N/A;  . CHOLECYSTECTOMY  1992  . GASTRIC BYPASS  2004  . TONSILLECTOMY    . TUBAL LIGATION    . VIDEO BRONCHOSCOPY WITH  ENDOBRONCHIAL ULTRASOUND N/A 10/12/2014   Procedure: VIDEO BRONCHOSCOPY WITH ENDOBRONCHIAL ULTRASOUND;  Surgeon: Brand Males, MD;  Location: [facility];  Service: Thoracic;  Laterality: N/A;    OB History    No data available       Home Medications    Prior to Admission medications   Medication Sig Start Date End Date Taking? Authorizing Provider  albuterol (VENTOLIN HFA) 108 (90 BASE) MCG/ACT inhaler Inhale 2 puffs into the lungs every 4 (four) hours as needed for  wheezing or shortness of breath. 09/08/15   Vicie Mutters, PA-C  ALPRAZolam (XANAX) 1 MG tablet TAKE 1/2 TO 1 TABLET BY MOUTH 3 TIMES A DAY AS NEEDED Patient taking differently: Take 0.5-1 mg by mouth 3 (three) times daily as needed for anxiety.  07/02/16   Unk Pinto, MD  amitriptyline (ELAVIL) 25 MG tablet TAKE 1/2 - 2 TABLETS AT NIGHT FOR LEG PAIN 11/06/16   Unk Pinto, MD  aspirin EC 81 MG EC tablet Take 1 tablet (81 mg total) by mouth daily. 11/15/16   Dayna N Dunn, PA-C  atorvastatin (LIPITOR) 40 MG tablet Take 1 tablet (40 mg total) by mouth every evening. 11/14/16   Dayna N Dunn, PA-C  Blood Glucose Monitoring Suppl (ONE TOUCH ULTRA SYSTEM KIT) w/Device KIT 1 kit by Does not apply route once. 01/08/16   Unk Pinto, MD  calcium citrate-vitamin D (CITRACAL+D) 315-200 MG-UNIT per tablet Take 1 tablet by mouth 3 (three) times daily.    Historical Provider, MD  cetirizine (ZYRTEC) 10 MG tablet Take 10 mg by mouth every morning.      Historical Provider, MD  fluticasone (FLONASE) 50 MCG/ACT nasal spray PLACE 2 SPRAYS INTO BOTH NOSTRILS DAILY. 10/15/16   Brand Males, MD  glipiZIDE (GLUCOTROL) 5 MG tablet Take 1/2 to 1 tab 3 x day if needed for elevated glucose 07/02/16   Unk Pinto, MD  glucose blood (ONE TOUCH TEST STRIPS) test strip CHECK BLOOD SUGAR 3 TIMES DAILY DUE TO POORLY CONTROLLED GLUCOSE-DX-E11.65 01/08/16   Unk Pinto, MD  isosorbide mononitrate (IMDUR) 30 MG 24 hr tablet Take 1 tablet (30 mg total) by mouth daily. 11/15/16   Dayna N Dunn, PA-C  Lancets (ONETOUCH ULTRASOFT) lancets Check blood sugar 3 times daily due to poorly controlled glucose-DX-E11.65. 01/08/16   Unk Pinto, MD  levothyroxine (SYNTHROID, LEVOTHROID) 200 MCG tablet TAKE 1/2 TO - 1 TABLETS BY MOUTH DAILY BEFORE BREAKFAST. ALTERNATE TAKING 100 AND 200MG Patient taking differently: Take 200 mcg by mouth daily before breakfast.  07/02/16   Unk Pinto, MD  lipase/protease/amylase (CREON)  12000 units CPEP capsule TAKE 1 CAPSULE BY MOUTH 3 TIMES DAILY. 07/02/16   Unk Pinto, MD  metFORMIN (GLUCOPHAGE) 500 MG tablet TAKE 1-2 TABLETS BY MOUTH 3 TIMES DAILY. TAKE 1 TAB AT BREAKFAST, 1 AT LUNCH AND 2 AT [facility] 07/02/16   Unk Pinto, MD  Multiple Vitamins-Minerals (MULTIVITAMIN WITH MINERALS) tablet Take 1 tablet by mouth daily.     Historical Provider, MD  nitroGLYCERIN (NITROSTAT) 0.4 MG SL tablet Place 1 tablet (0.4 mg total) under the tongue every 5 (five) minutes as needed for chest pain. Up to 3 doses 11/14/16   Dayna N Dunn, PA-C  OVER THE COUNTER MEDICATION Take 1 tablet by mouth daily. Calcium,magnesium and zinc in one.    Historical Provider, MD  sitaGLIPtin (JANUVIA) 100 MG tablet Take 1 tablet (100 mg total) by mouth daily. 07/02/16   Unk Pinto, MD  sucralfate (CARAFATE) 1 g tablet TAKE 1 TABLET  BY MOUTH 4 TIMES DAILY. 07/02/16   Unk Pinto, MD  Vitamin D, Ergocalciferol, (DRISDOL) 50000 units CAPS capsule Take 1 capsule (50,000 Units total) by mouth daily. 11/13/16   Unk Pinto, MD    Family History Family History  Problem Relation Age of Onset  . Colon cancer Mother   . Hypertension Mother   . Glaucoma Mother   . Diabetes Father   . Alzheimer's disease Father   . Heart attack Father 32  . Nephrolithiasis Father   . Emphysema Father     was a smoker  . Heart disease Father   . Nephrolithiasis Sister   . Heart attack Brother 57  . Nephrolithiasis Brother     Social History Social History  Substance Use Topics  . Smoking status: Former Smoker    Packs/day: 2.00    Years: 5.00    Types: Cigarettes    Quit date: 11/18/1977  . Smokeless tobacco: Never Used  . Alcohol use Yes     Comment: 2-3 glasses of wine once per month      Allergies   Ace inhibitors; Tilade [nedocromil]; Reglan [metoclopramide]; and Betadine [povidone iodine]   Review of Systems Review of Systems  Cardiovascular: Negative for chest pain.  Endocrine: Positive  for polydipsia and polyuria.  Neurological: Positive for dizziness and weakness (generalized). Negative for syncope.  All other systems reviewed and are negative.    Physical Exam Updated Vital Signs BP 130/76   Pulse 70   Temp 97.8 F (36.6 C) (Oral)   Resp 14   SpO2 100%   Physical Exam  Constitutional: She is oriented to person, place, and time. She appears well-developed and well-nourished. No distress.  Appears tired but non toxic   HENT:  Head: Normocephalic and atraumatic.  Eyes: EOM are normal.  Neck: Normal range of motion.  Cardiovascular: Normal rate and regular rhythm.   Murmur (faint systolic ) heard. Pulmonary/Chest: Effort normal and breath sounds normal.  Abdominal: Soft. She exhibits no distension. There is no tenderness.  Musculoskeletal: Normal range of motion.  Neurological: She is alert and oriented to person, place, and time.  Skin: Skin is warm and dry.  Psychiatric: She has a normal mood and affect. Judgment normal.  Nursing note and vitals reviewed.    ED Treatments / Results  DIAGNOSTIC STUDIES:  Oxygen Saturation is 98% on RA, normal by my interpretation.    COORDINATION OF CARE:  1:15 PM Discussed treatment plan with pt at bedside and pt agreed to plan.  Labs (all labs ordered are listed, but only abnormal results are displayed) Labs Reviewed  BASIC METABOLIC PANEL - Abnormal; Notable for the following:       Result Value   Sodium 134 (*)    Chloride 99 (*)    Glucose, Bld 288 (*)    BUN 22 (*)    Creatinine, Ser 1.35 (*)    GFR calc non Af Amer 41 (*)    GFR calc Af Amer 48 (*)    All other components within normal limits  CBC - Abnormal; Notable for the following:    WBC 13.8 (*)    All other components within normal limits  URINALYSIS, ROUTINE W REFLEX MICROSCOPIC - Abnormal; Notable for the following:    APPearance HAZY (*)    Glucose, UA >=500 (*)    Squamous Epithelial / LPF 0-5 (*)    All other components within normal  limits  CBG MONITORING, ED - Abnormal; Notable for the following:  Glucose-Capillary 217 (*)    All other components within normal limits    EKG  EKG Interpretation  Date/Time:  Thursday November 28 2016 10:52:56 EST Ventricular Rate:  76 PR Interval:  134 QRS Duration: 98 QT Interval:  364 QTC Calculation: 409 R Axis:   52 Text Interpretation:  Normal sinus rhythm ST & T wave abnormality, consider inferolateral ischemia Abnormal ECG more pronounce t wave inversion inferior compared to old. Confirmed by Johnney Killian, MD, Jeannie Done [phone]) on 11/28/2016 10:52:07 AM Also confirmed by Johnney Killian, MD, [place] [phone]), editor [address] [place], Leda Gauze [phone])  on 11/28/2016 11:23:32 AM       Radiology No results found.  Procedures Procedures (including critical care time)  Medications Ordered in ED Medications - No data to display   Initial Impression / Assessment and Plan / ED Course  I have reviewed the triage vital signs and the nursing notes.  Pertinent labs & imaging results that were available during my care of the patient were reviewed by me and considered in my medical decision making (see chart for details).  Clinical Course      Final Clinical Impressions(s) / ED Diagnoses   Final diagnoses:  Generalized weakness  Physical deconditioning  Hyperglycemia    New Prescriptions New Prescriptions   No medications on file   I personally preformed the services scribed in my presence. The recorded information has been reviewed is accurate. Virgel Manifold, MD.    Virgel Manifold, MD 12/11/16 [phone]

## 2016-11-28 NOTE — Telephone Encounter (Signed)
Alert notification received this AM from [facility], for this patient's critical elevation in CBG. I've already contacted patient w Gerald Stabs' recommendations.

## 2016-11-28 NOTE — ED Triage Notes (Signed)
Pt reports weakness for several weeks. She had recent MI in December. Pt reports her CBG was 572 ad PCP office this morning and sent here for possible DKA. Pt is alert and oriented, no distress noted.

## 2016-11-28 NOTE — Progress Notes (Addendum)
Assessment and Plan: Weakness/nausea/poor NPO secondary to likely HHS/DKA from recent NSTEMI, suggest inpatient evaluation- will need to be switched from oral DM meds to insulin- patient sent to the ER- declines waiting for a ride will take self, understands risks.  Will fill out FMLA for short term disability   Future Appointments Date Time Provider [place]  12/05/2016 9:15 AM Rogelia Mire, NP [facility]  12/05/2016 11:15 AM Vicie Mutters, PA-C [facility] None  12/17/2016 3:15 PM Max Villa Herb, DPM [facility]  03/03/2017 10:00 AM Unk Pinto, MD [facility] None    HPI 61 y.o.female with history of DM2 uncontrolled with CKD, recent NSTEMI in Dec 25th, HTN, chol, cholangiocarcinoma being followed by [facility] presents for uncontrolled sugars, weakness. She has fatigue, dizzy, weakness and has followed up with cardio on 01/04 and she was taken off the lopressor due to fatigue but continues to have issues. She is on glipizide 74m TID, metformin, januvia, sugar yesterday was 500's with hyponatremia and dehydration.  She has severe weakness, dizziness, fatigue, nausea, unable to take much PO. We were discussing swtiching her form oral meds to insulin. She has blurred vision, polydipsia and polyuria.   Lab Results  Component Value Date   HGBA1C 8.3 (H) 10/23/2016   Blood pressure 124/72, pulse 72, temperature 97.2 F (36.2 C), resp. rate 14, height '5\' 7"'  (1.702 m), weight 166 lb 6.4 oz (75.5 kg), SpO2 97 %.   Past Medical History:  Diagnosis Date  . Anemia    History of blood transfusion which is when pt reports being exposed to hepatitis in 2009.  .Marland KitchenAnxiety   . Asthma   . CAD (coronary artery disease)    a. 03/2016 St Echo: nl EF, no wma-->Nl study;  b. 11/13/16 NSTEMI/Cath: EF 55-65%, 40% prox RCA, 20% prox-mid LAD, 90% D2 lesion. The culprit for the NSTEMI appeared to be the 90% ostial stenosis in second diagonal which is a 2 mm in diameter and supplies a  relatively small territory. Managed medically.  . Cancer ([place]    Klatkin bile duct ca (2009)   . CKD (chronic kidney disease), stage III   . Diabetes mellitus   . GERD (gastroesophageal reflux disease)   . H/O gastric bypass   . Hepatitis   . Hyperlipidemia   . Hypertension   . Hypothyroid   . PVC's (premature ventricular contractions)      Allergies  Allergen Reactions  . Ace Inhibitors Anaphylaxis  . Tilade [Nedocromil] Anaphylaxis  . Reglan [Metoclopramide] Nausea And Vomiting  . Betadine [Povidone Iodine] Rash    iching     Current Outpatient Prescriptions on File Prior to Visit  Medication Sig  . albuterol (VENTOLIN HFA) 108 (90 BASE) MCG/ACT inhaler Inhale 2 puffs into the lungs every 4 (four) hours as needed for wheezing or shortness of breath.  . ALPRAZolam (XANAX) 1 MG tablet TAKE 1/2 TO 1 TABLET BY MOUTH 3 TIMES A DAY AS NEEDED (Patient taking differently: Take 0.5-1 mg by mouth 3 (three) times daily as needed for anxiety. )  . amitriptyline (ELAVIL) 25 MG tablet TAKE 1/2 - 2 TABLETS AT NIGHT FOR LEG PAIN  . aspirin EC 81 MG EC tablet Take 1 tablet (81 mg total) by mouth daily.  .Marland Kitchenatorvastatin (LIPITOR) 40 MG tablet Take 1 tablet (40 mg total) by mouth every evening.  .Marland Kitchenazelastine (ASTELIN) 0.1 % nasal spray PLACE 2 SPRAYS INTO BOTH NOSTRILS 2 (TWO) TIMES DAILY. USE IN EACH NOSTRIL AS DIRECTED  .  Blood Glucose Monitoring Suppl (ONE TOUCH ULTRA SYSTEM KIT) w/Device KIT 1 kit by Does not apply route once.  . calcium citrate-vitamin D (CITRACAL+D) 315-200 MG-UNIT per tablet Take 1 tablet by mouth 3 (three) times daily.  . celecoxib (CELEBREX) 200 MG capsule TAKE ONE CAPSULE BY MOUTH EVERY DAY WITH FOOD  . cetirizine (ZYRTEC) 10 MG tablet Take 10 mg by mouth every morning.    . fluticasone (FLONASE) 50 MCG/ACT nasal spray PLACE 2 SPRAYS INTO BOTH NOSTRILS DAILY.  . furosemide (LASIX) 40 MG tablet Take 1 tablet (40 mg total) by mouth 2 (two) times daily.  Marland Kitchen glipiZIDE  (GLUCOTROL) 5 MG tablet Take 1/2 to 1 tab 3 x day if needed for elevated glucose  . glucose blood (ONE TOUCH TEST STRIPS) test strip CHECK BLOOD SUGAR 3 TIMES DAILY DUE TO POORLY CONTROLLED GLUCOSE-DX-E11.65  . isosorbide mononitrate (IMDUR) 30 MG 24 hr tablet Take 1 tablet (30 mg total) by mouth daily.  . Lancets (ONETOUCH ULTRASOFT) lancets Check blood sugar 3 times daily due to poorly controlled glucose-DX-E11.65.  Marland Kitchen levothyroxine (SYNTHROID, LEVOTHROID) 200 MCG tablet TAKE 1/2 TO - 1 TABLETS BY MOUTH DAILY BEFORE BREAKFAST. ALTERNATE TAKING 100 AND 200MG (Patient taking differently: Take 200 mcg by mouth daily before breakfast. )  . lipase/protease/amylase (CREON) 12000 units CPEP capsule TAKE 1 CAPSULE BY MOUTH 3 TIMES DAILY.  . metFORMIN (GLUCOPHAGE) 500 MG tablet TAKE 1-2 TABLETS BY MOUTH 3 TIMES DAILY. TAKE 1 TAB AT BREAKFAST, 1 AT LUNCH AND 2 AT [facility]  . metoprolol tartrate (LOPRESSOR) 25 MG tablet Take 0.5 tablets (12.5 mg total) by mouth 2 (two) times daily.  . Multiple Vitamins-Minerals (MULTIVITAMIN WITH MINERALS) tablet Take 1 tablet by mouth daily.   . nitroGLYCERIN (NITROSTAT) 0.4 MG SL tablet Place 1 tablet (0.4 mg total) under the tongue every 5 (five) minutes as needed for chest pain. Up to 3 doses  . OVER THE COUNTER MEDICATION Take 1 tablet by mouth daily. Calcium,magnesium and zinc in one.  . potassium chloride SA (KLOR-CON M20) 20 MEQ tablet Take 2 tablets (40 mEq total) by mouth 2 (two) times daily.  . predniSONE (DELTASONE) 20 MG tablet 1 tab 3 x day for 3 days, then 1 tab 2 x day for 3 days, then 1 tab 1 x day for 5 days  . promethazine-dextromethorphan (PROMETHAZINE-DM) 6.25-15 MG/5ML syrup TAKE 1 TO 2 TEAPSOONS BY MOUTH 4 TIMES A DAY OR EVERY 4 HOURS IF NEEDED FOR COUGH  . sitaGLIPtin (JANUVIA) 100 MG tablet Take 1 tablet (100 mg total) by mouth daily.  . sucralfate (CARAFATE) 1 g tablet TAKE 1 TABLET BY MOUTH 4 TIMES DAILY.  Marland Kitchen Vitamin D, Ergocalciferol, (DRISDOL) 50000  units CAPS capsule Take 1 capsule (50,000 Units total) by mouth daily.   No current facility-administered medications on file prior to visit.     ROS: all negative except above.   Physical Exam: Filed Weights   11/28/16 0931  Weight: 166 lb 6.4 oz (75.5 kg)   BP 124/72   Pulse 72   Temp 97.2 F (36.2 C)   Resp 14   Ht '5\' 7"'  (1.702 m)   Wt 166 lb 6.4 oz (75.5 kg)   SpO2 97%   BMI 26.06 kg/m  General Appearance:  Sickly appearing, in no apparent distress. Eyes: PERRLA, EOMs, conjunctiva no swelling or erythema Sinuses: No Frontal/maxillary tenderness ENT/Mouth: Ext aud canals clear, TMs without erythema, bulging. No erythema, swelling, or exudate on post pharynx.  Tonsils not swollen  or erythematous. Hearing normal.  Neck: Supple, thyroid normal.  Respiratory: Respiratory effort normal, BS equal bilaterally without rales, rhonchi, wheezing or stridor.  Cardio: RRR with no MRGs. Brisk peripheral pulses without edema.  Abdomen: Soft, + BS.  Non tender, no guarding, rebound, hernias, masses. Lymphatics: Non tender without lymphadenopathy.  Musculoskeletal: Full ROM, decreased strength diffusely, slow steady gait.  Skin: Warm, dry without rashes, lesions, ecchymosis.  Neuro: Cranial nerves intact. No cerebellar symptoms.  Psych: Awake and oriented X 3, normal affect, Insight and Judgment appropriate.     Vicie Mutters, PA-C 9:38 AM [facility]

## 2016-11-28 NOTE — Discharge Planning (Signed)
Discharge order placed. EDCM reviewed chart for possible CM needs.  No needs identified or communicated.  

## 2016-11-29 ENCOUNTER — Encounter: Payer: Self-pay | Admitting: Physician Assistant

## 2016-11-29 ENCOUNTER — Ambulatory Visit: Payer: 59 | Admitting: Physician Assistant

## 2016-11-29 VITALS — BP 120/76 | HR 86 | Temp 97.3°F | Resp 16 | Ht 67.0 in | Wt 169.2 lb

## 2016-11-29 DIAGNOSIS — E1122 Type 2 diabetes mellitus with diabetic chronic kidney disease: Secondary | ICD-10-CM

## 2016-11-29 DIAGNOSIS — N182 Chronic kidney disease, stage 2 (mild): Principal | ICD-10-CM

## 2016-11-29 DIAGNOSIS — I214 Non-ST elevation (NSTEMI) myocardial infarction: Secondary | ICD-10-CM

## 2016-11-29 DIAGNOSIS — E782 Mixed hyperlipidemia: Secondary | ICD-10-CM

## 2016-11-29 DIAGNOSIS — I10 Essential (primary) hypertension: Secondary | ICD-10-CM

## 2016-11-29 NOTE — Patient Instructions (Signed)
Stop the glipizide and januvia, continue metformin Start on the vial of the 75/25 vial  You will start on 15 units in the morning and 10 units at night.  Take it 10-30 mins within eating, twice daily If you get any low sugars, with symptoms such as shaking, sweating, nausea, confusion, decrease your insulin by 3-5 units.   If your morning sugar is above 150, increase your morning insulin by 3 units every 3 days If your night time sugar is above 200, then increase your night time insulin by 3 units every 3 days.   Targets for Glucose Readings: Time of Check Target for patients WITHOUT Diabetes Target for DIABETICS  Before Meals Less than 100  less than 150  Two hours after meals Less than 200  Less than 250   If your morning sugar is always below 100 then the issue is with your sugar spiking after meals. Try to take your blood sugar approximately 2 hours after eating, this number should be less than 200. If it is not, think about the foods that you ate and better choices you can make.    How and Where to Give Subcutaneous Insulin Injections, Adult People with type 1 diabetes must take insulin since their bodies do not make it. People with type 2 diabetes may require insulin. There are many different types of insulin as well as other injectable diabetes medicines that are meant to be injected into the fat layer under your skin. The type of insulin or injectable diabetes medicine you take may determine how many injections you give yourself and when to take the injections. Choosing a site for injection Insulin absorption varies from site to site. As with any injectable medication it is best for the insulin to be injected within the same body region. However, do not inject the insulin in the same spot each time. Rotating the spots you give your injections will prevent inflammation or tissue breakdown. There are four main regions that can be used for injections. The regions include the:  Abdomen  (preferred region, especially for non-insulin injectable diabetes medicine).  Front and upper outer sides of thighs.  Back of upper arm.  Buttocks. Using a syringe and vial Drawing up insulin: single insulin dose  1. Wash your hands with soap and water. 2. Gently roll the insulin bottle (vial) between your hands to mix it. Do not shake the vial. 3. Clean the top rubber part of the vial with an alcohol wipe. Be sure that the plastic pop-top has been removed on newer vials. 4. Remove the plastic cover from the needle on the syringe. Do not let the needle touch anything. 5. Pull the plunger back to draw air into the syringe. The air should be the same amount as the insulin dose. 6. Push the needle through the rubber on the top of the vial. Do not turn the vial over. 7. Push the plunger in all the way to put the air into the vial. 8. Leave the needle in the vial and turn the vial and syringe upside down. 9. Pull down slowly on the plunger, drawing the amount of insulin you need into the syringe. 10. Look for air bubbles in the syringe. You may need to push the plunger up and down 2 to 3 times to slowly get rid of any air bubbles in the syringe. 11. Pull back the plunger to get your correct dose. 12. Remove the needle from the vial. 13. Use an alcohol wipe to  clean the area of the body to be injected. 14. Pinch up 1 inch of skin and hold it. 15. Put the needle straight into the skin (90-degree angle). Put the needle in as far as it will go (to the hub). The needle may need to be injected at a 45-degree angle in small adults with little fat. 16. When the needle is in, you can let go of your skin. 17. Push the plunger down all the way to inject the insulin. 18. Pull the needle straight out of the skin. 19. Press the alcohol wipe over the spot where you gave your injection. Keep it there for a few seconds. Do not rub the area. 20. Do not put the plastic cover back on the needle. Drawing up  insulin: mixing 2 insulins 1. Wash your hands with soap and water. 2. Gently roll the vial of "cloudy" insulin between your hands or rotate the vial from top to bottom to mix. 3. Clean the top of both vials with an alcohol wipe. Be sure that the plastic pop-top lid has been removed on newer vials. 4. Pull air into the syringe to equal the dose of "cloudy" insulin. 5. Stick the needle into the "cloudy" insulin vial and inject the air. Be sure to keep the vial upright. 6. Remove the needle from the "cloudy" insulin vial. 7. Pull air into the syringe to equal the dose of "clear" insulin. 8. Stick the needle into the "clear" insulin vial and inject the air. 9. Leave the needle in the "clear" insulin vial and turn the vial upside down. 10. Pull down on the plunger and slowly draw into the syringe the number of units of "clear" insulin desired. 11. Look for air bubbles in the syringe. You may need to push the plunger up and down 2 to 3 times to slowly get rid of any air bubbles in the syringe. 12. Remove the needle from the "clear" insulin vial. 13. Stick the needle into the "cloudy" insulin vial. Do not inject any of the "clear" insulin into the "cloudy" vial. 14. Turn the "cloudy" vial upside down and pull the plunger down to the number of units that equals the total number of units of "clear" and "cloudy" insulins. 15. Remove the needle from the "cloudy" insulin vial. 16. Use an alcohol wipe to clean the area of the body to be injected. 17. Put the needle straight into the skin (90-degree angle). Put the needle in as far as it will go (to the hub). The needle may need to be injected at a 45-degree angle in small adults with little fat. 18. When the needle is in, you can let go of your skin. 19. Push the plunger down all the way to inject the insulin. 20. Pull the needle straight out of the skin. 21. Press the alcohol wipe over the spot where you gave your injection. Keep it there for a few seconds.  Do not rub the area. 22. Do not put the plastic cover back on the needle. Using an insulin pen  1. Wash your hands with soap and water. 2. If you are using the "cloudy" insulin, roll the pen between your palms several times or rotate the pen top to bottom several times. 3. Remove the insulin pen cap. 4. Clean the rubber stopper of the cartridge with an alcohol wipe. 5. Remove the protective paper tab from the disposable needle. 6. Screw the needle onto the pen. 7. Remove the outer plastic needle cover. 8.  Remove the inner plastic needle cover. 9. Prime the insulin pen by turning the button (dial) to 2 units. Hold the pen with the needle pointing up, and push the dial on the opposite end until a drop of insulin appears at the needle tip. If no insulin appears, repeat this step. 10. Dial the number of units of insulin you will inject. 11. Use an alcohol wipe to clean the area of the body to be injected. 12. Pinch up 1 inch of skin and hold it. 13. Put the needle straight into the skin (90-degree angle). 14. Push the dial down to push the insulin into the fat tissue. 15. Count to 10 slowly. Then, remove the needle from the fat tissue. 16. Carefully replace the larger outer plastic needle cover over the needle and unscrew the capped needle. Throwing away supplies  Discard used needles in a puncture proof sharps disposal container. Follow disposal regulations for the area where you live.  Vials and empty disposable pens may be thrown away in the regular trash. This information is not intended to replace advice given to you by your health care provider. Make sure you discuss any questions you have with your health care provider. Document Released: 01/25/2004 Document Revised: 04/11/2016 Document Reviewed: 04/13/2013 Elsevier Interactive Patient Education  2017 [place] [employer] The brain needs two things: oxygen and sugar. If the blood sugar level drops too low in the early  morning hours, hormones (such as growth hormone, cortisol, and catecholamines) are released to make sure you brain can still function. These help reverse the low blood sugar level but may lead to blood sugar levels that are higher than normal in the morning. This is common for patient that take insulin at night or do not ear regular snacks.  Please schedule to get up in the middle of the night to check your blood sugar.   This may be happening to you. Please eat a high protein night time snack and we will be decreasing your night time insulin as follows:     Insulin Storage and Care All insulin pens and bottles (vials) have expiration dates. Refrigerated, unopened insulin pens, insulin cartridges, and insulin vials are good until the expiration date. Do not use insulin after this date. Once insulin is opened, it should be used within a certain time period. Opened means once the rubber is punctured. Always follow the instructions that come with your insulin. Storing and caring for your insulin  If insulin is kept at room temperature, the temperature must be less than 76F (30C). Some types of insulin can be stored only at less than 8F (25C).  If insulin is kept in the refrigerator, the temperature must be between 57F and 57F (3C and 8C).  Do not freeze insulin.  Keep insulin away from direct heat or sunlight.  Throw away the insulin if it is discolored, thick, or has clumps or suspended white particles in it.  Be sure to mix cloudy insulin by rolling between your hands gently. Pens can be "rocked" from end to end.  Opened insulin pens should be kept at room temperature.  Always have extra insulin on hand.  Never leave insulin in your vehicle. This information is not intended to replace advice given to you by your health care provider. Make sure you discuss any questions you have with your health care provider. Document Released: 09/01/2009 Document Revised: 04/11/2016 Document  Reviewed: 03/25/2013 Elsevier Interactive Patient Education  2017 [employer].

## 2016-11-29 NOTE — Progress Notes (Signed)
Assessment and Plan: 1. Type 2 diabetes mellitus with stage 2 chronic kidney disease, without long-term current use of insulin (HCC) Stop all DM meds but metformin, given samples of 75/25 humulin, and instructed on use of needles, discussed where to inject, monitoring sugars, hypoglycemia symptoms Will start 15 and 10 units and increase q 3 days pending on blood sugars  Follow up 1-2 week with numbers Showed how to take sugar and use glucometer in the office  2. NSTEMI (non-ST elevated myocardial infarction) [facility]) Continue medical treatment  3. Essential hypertension - continue medications, DASH diet, exercise and monitor at home. Call if greater than 130/80.   4. Hyperlipidemia -continue medications, check lipids, decrease fatty foods, increase activity.    Will fill out FMLA for short term disability   Future Appointments Date Time Provider [place]  12/05/2016 9:15 AM Rogelia Mire, NP [facility]  12/05/2016 11:15 AM Vicie Mutters, PA-C [facility] None  12/17/2016 3:15 PM Max Villa Herb, DPM [facility]  03/03/2017 10:00 AM Unk Pinto, MD [facility] None    HPI 61 y.o.female with history of DM2 uncontrolled with CKD, recent NSTEMI in Dec 25th, HTN, chol, cholangiocarcinoma being followed by [facility] presents for uncontrolled sugars, weakness. She has fatigue, dizzy, weakness and has followed up with cardio on 01/04 and she was taken off the lopressor due to fatigue but continues to have issues. She is on glipizide 98m TID, metformin, januvia, sugar yesterday was 500's with hyponatremia and dehydration, sent to the Er to rule out HHS however labs there were improved, will work today on switching her from oral meds to insulin. She states neither of her glucometers are working.   Lab Results  Component Value Date   HGBA1C 8.3 (H) 10/23/2016   Blood pressure 120/76, pulse 86, temperature 97.3 F (36.3 C), resp. rate 16, height _0  (1.702 m), weight 169  lb 3.2 oz (76.7 kg), SpO2 97 %.   Past Medical History:  Diagnosis Date  . Anemia    History of blood transfusion which is when pt reports being exposed to hepatitis in 2009.  .Marland KitchenAnxiety   . Asthma   . CAD (coronary artery disease)    a. 03/2016 St Echo: nl EF, no wma-->Nl study;  b. 11/13/16 NSTEMI/Cath: EF 55-65%, 40% prox RCA, 20% prox-mid LAD, 90% D2 lesion. The culprit for the NSTEMI appeared to be the 90% ostial stenosis in second diagonal which is a 2 mm in diameter and supplies a relatively small territory. Managed medically.  . 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 CKD (chronic kidney disease), stage III   . Diabetes mellitus   . GERD (gastroesophageal reflux disease)   . H/O gastric bypass   . Hepatitis   . Hyperlipidemia   . Hypertension   . Hypothyroid   . PVC's (premature ventricular contractions)      Allergies  Allergen Reactions  . Ace Inhibitors Anaphylaxis  . Tilade [Nedocromil] Anaphylaxis  . Reglan [Metoclopramide] Nausea And Vomiting  . Betadine [Povidone Iodine] Rash    iching     Current Outpatient Prescriptions on File Prior to Visit  Medication Sig  . albuterol (VENTOLIN HFA) 108 (90 BASE) MCG/ACT inhaler Inhale 2 puffs into the lungs every 4 (four) hours as needed for wheezing or shortness of breath.  . ALPRAZolam (XANAX) 1 MG tablet TAKE 1/2 TO 1 TABLET BY MOUTH 3 TIMES A DAY AS NEEDED (Patient taking differently: Take 0.5-1 mg by mouth  at bedtime. )  . amitriptyline (ELAVIL) 25 MG tablet TAKE 1/2 - 2 TABLETS AT NIGHT FOR LEG PAIN (Patient taking differently: Take 25 mg by mouth at bedtime. )  . antiseptic oral rinse (BIOTENE) LIQD 15 mLs by Mouth Rinse route as needed for dry mouth.  Marland Kitchen aspirin EC 81 MG EC tablet Take 1 tablet (81 mg total) by mouth daily.  Marland Kitchen atorvastatin (LIPITOR) 40 MG tablet Take 1 tablet (40 mg total) by mouth every evening.  . Blood Glucose Monitoring Suppl (ONE TOUCH ULTRA SYSTEM KIT) w/Device KIT 1 kit by Does not  apply route once.  . calcium citrate-vitamin D (CITRACAL+D) 315-200 MG-UNIT per tablet Take 1 tablet by mouth 3 (three) times daily.  . cetirizine (ZYRTEC) 10 MG tablet Take 10 mg by mouth every morning.    . cholecalciferol (VITAMIN D) 1000 units tablet Take 1,000 Units by mouth daily. Takes daily except Sundays  . fluticasone (FLONASE) 50 MCG/ACT nasal spray PLACE 2 SPRAYS INTO BOTH NOSTRILS DAILY.  Marland Kitchen glipiZIDE (GLUCOTROL) 5 MG tablet Take 1/2 to 1 tab 3 x day if needed for elevated glucose (Patient taking differently: Take 5 mg by mouth 4 (four) times daily. )  . glucose blood (ONE TOUCH TEST STRIPS) test strip CHECK BLOOD SUGAR 3 TIMES DAILY DUE TO POORLY CONTROLLED GLUCOSE-DX-E11.65  . isosorbide mononitrate (IMDUR) 30 MG 24 hr tablet Take 1 tablet (30 mg total) by mouth daily.  . Lancets (ONETOUCH ULTRASOFT) lancets Check blood sugar 3 times daily due to poorly controlled glucose-[id number].  Marland Kitchen levothyroxine (SYNTHROID, LEVOTHROID) 200 MCG tablet TAKE 1/2 TO - 1 TABLETS BY MOUTH DAILY BEFORE BREAKFAST. ALTERNATE TAKING 100 AND 200MG (Patient taking differently: Take 100-200 mcg by mouth See admin instructions. Pt takes 134mg and 2054m every other day alternating)  . lipase/protease/amylase (CREON) 12000 units CPEP capsule TAKE 1 CAPSULE BY MOUTH 3 TIMES DAILY.  . metFORMIN (GLUCOPHAGE) 500 MG tablet TAKE 1-2 TABLETS BY MOUTH 3 TIMES DAILY. TAKE 1 TAB AT BREAKFAST, 1 [facility]UNCH AND [facility] Multiple Vitamins-Minerals (MULTIVITAMIN WITH MINERALS) tablet Take 1 tablet by mouth daily.   . nitroGLYCERIN (NITROSTAT) 0.4 MG SL tablet Place 1 tablet (0.4 mg total) under the tongue every 5 (five) minutes as needed for chest pain. Up to 3 doses  . OVER THE COUNTER MEDICATION Take 1 tablet by mouth daily. Calcium,magnesium and zinc in one.  . pantoprazole (PROTONIX) 40 MG tablet Take 40 mg by mouth every morning.  . sitaGLIPtin (JANUVIA) 100 MG tablet Take 1 tablet (100 mg total) by mouth daily.  .  sucralfate (CARAFATE) 1 g tablet TAKE 1 TABLET BY MOUTH 4 TIMES DAILY.  . Marland Kitchenitamin D, Ergocalciferol, (DRISDOL) 50000 units CAPS capsule Take 1 capsule (50,000 Units total) by mouth daily.   No current facility-administered medications on file prior to visit.     ROS: all negative except above.   Physical Exam: Filed Weights   11/29/16 1038  Weight: 169 lb 3.2 oz (76.7 kg)   BP 120/76   Pulse 86   Temp 97.3 F (36.3 C)   Resp 16   Ht _0  (1.702 m)   Wt 169 lb 3.2 oz (76.7 kg)   SpO2 97%   BMI 26.50 kg/m  General Appearance:  Sickly appearing, in no apparent distress. Eyes: PERRLA, EOMs, conjunctiva no swelling or erythema Sinuses: No Frontal/maxillary tenderness ENT/Mouth: Ext aud canals clear, TMs without erythema, bulging. No erythema, swelling, or exudate on post pharynx.  Tonsils  not swollen or erythematous. Hearing normal.  Neck: Supple, thyroid normal.  Respiratory: Respiratory effort normal, BS equal bilaterally without rales, rhonchi, wheezing or stridor.  Cardio: RRR with no MRGs. Brisk peripheral pulses without edema.  Abdomen: Soft, + BS.  Non tender, no guarding, rebound, hernias, masses. Lymphatics: Non tender without lymphadenopathy.  Musculoskeletal: Full ROM, decreased strength diffusely, slow steady gait.  Skin: Warm, dry without rashes, lesions, ecchymosis.  Neuro: Cranial nerves intact. No cerebellar symptoms.  Psych: Awake and oriented X 3, normal affect, Insight and Judgment appropriate.     Vicie Mutters, PA-C 12:33 PM [facility]

## 2016-12-03 ENCOUNTER — Encounter: Payer: Self-pay | Admitting: Physician Assistant

## 2016-12-04 ENCOUNTER — Encounter: Payer: Self-pay | Admitting: Physician Assistant

## 2016-12-04 ENCOUNTER — Other Ambulatory Visit: Payer: Self-pay | Admitting: Internal Medicine

## 2016-12-05 ENCOUNTER — Ambulatory Visit: Payer: Self-pay | Admitting: Physician Assistant

## 2016-12-05 ENCOUNTER — Ambulatory Visit: Payer: Managed Care, Other (non HMO) | Admitting: Nurse Practitioner

## 2016-12-05 ENCOUNTER — Other Ambulatory Visit: Payer: Self-pay | Admitting: Internal Medicine

## 2016-12-11 ENCOUNTER — Ambulatory Visit (INDEPENDENT_AMBULATORY_CARE_PROVIDER_SITE_OTHER): Payer: Managed Care, Other (non HMO) | Admitting: Nurse Practitioner

## 2016-12-11 ENCOUNTER — Encounter: Payer: Self-pay | Admitting: Nurse Practitioner

## 2016-12-11 VITALS — BP 115/70 | HR 72 | Ht 67.0 in | Wt 161.8 lb

## 2016-12-11 DIAGNOSIS — R5383 Other fatigue: Secondary | ICD-10-CM | POA: Diagnosis not present

## 2016-12-11 DIAGNOSIS — I25118 Atherosclerotic heart disease of native coronary artery with other forms of angina pectoris: Secondary | ICD-10-CM

## 2016-12-11 DIAGNOSIS — I214 Non-ST elevation (NSTEMI) myocardial infarction: Secondary | ICD-10-CM | POA: Diagnosis not present

## 2016-12-11 DIAGNOSIS — N183 Chronic kidney disease, stage 3 unspecified: Secondary | ICD-10-CM

## 2016-12-11 DIAGNOSIS — I10 Essential (primary) hypertension: Secondary | ICD-10-CM | POA: Diagnosis not present

## 2016-12-11 DIAGNOSIS — E782 Mixed hyperlipidemia: Secondary | ICD-10-CM

## 2016-12-11 NOTE — Progress Notes (Signed)
Office Visit    Patient Name: Courtney Stevens Date of Encounter: 12/11/2016  Primary Care Provider:  [name], MD Primary Cardiologist:  P. Martinique, MD   Chief Complaint    61 year old female status post recent non-STEMI who presents for f/u.  Past Medical History    Past Medical History:  Diagnosis Date  . Anemia    History of blood transfusion which is when pt reports being exposed to hepatitis in 2009.  Marland Kitchen Anxiety   . Asthma   . CAD (coronary artery disease)    a. 03/2016 St Echo: nl EF, no wma-->Nl study;  b. 11/13/16 NSTEMI/Cath: EF 55-65%, 40% prox RCA, 20% prox-mid LAD, 90% D2 lesion. The culprit for the NSTEMI appeared to be the 90% ostial stenosis in second diagonal which is a 2 mm in diameter and supplies a relatively small territory. Managed medically.  . Cancer ([place])    Klatkin bile duct ca (2009)   . CKD (chronic kidney disease), stage III   . Diabetes mellitus   . GERD (gastroesophageal reflux disease)   . H/O gastric bypass   . Hepatitis   . Hyperlipidemia   . Hypertension   . Hypothyroid   . PVC's (premature ventricular contractions)    Past Surgical History:  Procedure Laterality Date  . ABDOMINAL HYSTERECTOMY  1990   BIL OOPHORECTOMY  . APPENDECTOMY    . Bile Duct Resection  2009  . BREAST SURGERY     BIOPSY NEG  . BUNIONECTOMY Right 2005  . CARDIAC CATHETERIZATION N/A 11/13/2016   Procedure: Left Heart Cath and Coronary Angiography;  Surgeon: Wellington Hampshire, MD;  Location: [facility];  Service: Cardiovascular;  Laterality: N/A;  . CHOLECYSTECTOMY  1992  . GASTRIC BYPASS  2004  . TONSILLECTOMY    . TUBAL LIGATION    . VIDEO BRONCHOSCOPY WITH ENDOBRONCHIAL ULTRASOUND N/A 10/12/2014   Procedure: VIDEO BRONCHOSCOPY WITH ENDOBRONCHIAL ULTRASOUND;  Surgeon: Brand Males, MD;  Location: [facility];  Service: Thoracic;  Laterality: N/A;    Allergies  Allergies  Allergen Reactions  . Ace Inhibitors Anaphylaxis  . Tilade  [Nedocromil] Anaphylaxis  . Reglan [Metoclopramide] Nausea And Vomiting  . 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 She is s/p nstemi in late December with cath revealing severe ostial D2 dzs and otw nonobs dzs.  The D2 was only a 33m vessel and thus med Rx was recommended.  She was also hypokalemic and her diuretics were adjusted.    I saw her in f/u in early January @ which time I was concerned that either she potentially wasn't tolerating her  blocker or could have been dehydrated given h/o CKD.  BMET revealed hyperglycemia and bun/creat was elevated @ 47/1.77.  She held her lasix and f/u bmet a week later showed some improvement in renal fxn but persistent hyperglycemia  576.  She was referred to her PCP and f/u that day.  She was sent to the ER where gluc was 288 and she was d/c'd home.  She was then placed on insulin but developed significant, symptomatic hyperglycemia.  Last week, she lost consciousness while watching TV and her husband found her.  EMS was called.  Glucose was 17.  She was treated  in the field and chose not to go into the ER.  Since then, she was advised to reduce insulin but she has decided to forgo taking it altogether and instead has resumed her oral meds.  She has been following her sugars closely and notes that she is generally < 100 in the am, 150's in the afternoon, and > 200 around dinner time.  She has f/u with IM on Friday.  Though gluc issues seem to be improved, she remains fatigued, intermittently nauseated, and says that food has a funny taste.  She did have a 2o min episode of mild c/p the other night.  She's not sure that it wasn't indigestion.  No assoc Ss.  She denies palpitations,  dyspnea, pnd, orthopnea, n, v, dizziness, syncope, edema, weight gain, or early satiety.   Home Medications    Prior to Admission medications   Medication Sig Start Date End Date Taking? Authorizing Provider  albuterol (VENTOLIN HFA) 108 (90 BASE) MCG/ACT inhaler Inhale 2 puffs into the lungs every 4 (four) hours as needed for wheezing or shortness of breath. 09/08/15  Yes Vicie Mutters, PA-C  ALPRAZolam Duanne Moron) 1 MG tablet Take 1 mg by mouth 3 (three) times daily as needed for anxiety.   Yes Historical Provider, MD  amitriptyline (ELAVIL) 25 MG tablet Take 25 mg by mouth at bedtime.   Yes Historical Provider, MD  antiseptic oral rinse (BIOTENE) LIQD 15 mLs by Mouth Rinse route as needed for dry mouth.   Yes Historical Provider, MD  aspirin EC 81 MG EC tablet Take 1 tablet (81 mg total) by mouth daily. 11/15/16  Yes Dayna N Dunn, PA-C  atorvastatin (LIPITOR) 40 MG tablet Take 1 tablet (40 mg total) by mouth every evening. 11/14/16  Yes Dayna N Dunn, PA-C  Blood Glucose Monitoring Suppl (ONE TOUCH ULTRA SYSTEM KIT) w/Device KIT 1 kit by Does not apply route once. 01/08/16  Yes Unk Pinto, MD  calcium citrate-vitamin D (CITRACAL+D) 315-200 MG-UNIT per tablet Take 1 tablet by mouth 3 (three) times daily.   Yes Historical Provider, MD  cetirizine (ZYRTEC) 10 MG tablet Take 10 mg by mouth every morning.     Yes Historical Provider, MD  cholecalciferol (VITAMIN D) 1000 units tablet Take 1,000 Units by mouth daily.    Yes Historical Provider, MD  ergocalciferol (VITAMIN D2) 50000 units capsule Take 50,000 Units by mouth daily. DOES NOT TAKE ON SUNDAYS   Yes Historical Provider, MD  fluticasone (FLONASE) 50 MCG/ACT nasal spray PLACE 2 SPRAYS INTO BOTH NOSTRILS DAILY. 10/15/16  Yes Brand Males, MD  glipiZIDE (GLUCOTROL) 5 MG tablet Take 5 mg by mouth daily before breakfast.   Yes Historical Provider, MD  glucose blood (ONE TOUCH TEST STRIPS) test strip CHECK BLOOD SUGAR 3 TIMES DAILY DUE TO  POORLY CONTROLLED GLUCOSE-DX-E11.65 01/08/16  Yes Unk Pinto, MD  isosorbide mononitrate (IMDUR) 30 MG 24 hr tablet Take 1 tablet (30 mg total) by mouth daily. 11/15/16  Yes Dayna N Dunn, PA-C  Lancets (ONETOUCH ULTRASOFT) lancets Check blood sugar 3 times daily due to poorly controlled glucose-DX-E11.65. 01/08/16  Yes Unk Pinto, MD  levothyroxine (SYNTHROID, LEVOTHROID) 200 MCG tablet Take 200 mcg by mouth daily before breakfast. PT ALTERNATES 1/2 TABLET QOD WITH 1 TABLET QOD   Yes Historical Provider, MD  lipase/protease/amylase (CREON) 12000 units CPEP capsule TAKE 1 CAPSULE BY MOUTH 3 TIMES DAILY. 07/02/16  Yes Unk Pinto, MD  metFORMIN (GLUCOPHAGE) 500 MG tablet TAKE 1-2 TABLETS BY MOUTH 3 TIMES DAILY.  TAKE 1 TAB AT BREAKFAST, 1 AT LUNCH AND 2 AT [facility] 07/02/16  Yes Unk Pinto, MD  Multiple Vitamins-Minerals (MULTIVITAMIN WITH MINERALS) tablet Take 1 tablet by mouth daily.    Yes Historical Provider, MD  nitroGLYCERIN (NITROSTAT) 0.4 MG SL tablet Place 1 tablet (0.4 mg total) under the tongue every 5 (five) minutes as needed for chest pain. Up to 3 doses 11/14/16  Yes Dayna N Dunn, PA-C  OVER THE COUNTER MEDICATION Take 1 tablet by mouth daily. Calcium,magnesium and zinc in one.   Yes Historical Provider, MD  pantoprazole (PROTONIX) 40 MG tablet Take 40 mg by mouth every morning. 09/07/16  Yes Historical Provider, MD  sitaGLIPtin (JANUVIA) 100 MG tablet Take 1 tablet (100 mg total) by mouth daily. 07/02/16  Yes Unk Pinto, MD  sucralfate (CARAFATE) 1 g tablet TAKE 1 TABLET BY MOUTH 4 TIMES DAILY. 07/02/16  Yes Unk Pinto, MD    Review of Systems    Ongoing fatigue.  She did have one episode of c/p.  She denies palpitations, dyspnea, pnd, orthopnea, n, v, dizziness, syncope, edema, weight gain, or early satiety.  All other systems reviewed and are otherwise negative except as noted above.  Physical Exam    VS:  BP 115/70   Pulse 72   Ht '5\' 7"'  (1.702 m)   Wt 161 lb  12.8 oz (73.4 kg)   BMI 25.34 kg/m  , BMI Body mass index is 25.34 kg/m. GEN: Well nourished, well developed, in no acute distress.  HEENT: normal.  Neck: Supple, no JVD, carotid bruits, or masses. Cardiac: RRR, no murmurs, rubs, or gallops. No clubbing, cyanosis, edema.  Radials/DP/PT 2+ and equal bilaterally.  Respiratory:  Respirations regular and unlabored, clear to auscultation bilaterally. GI: Soft, nontender, nondistended, BS + x 4. MS: no deformity or atrophy. Skin: warm and dry, no rash. Neuro:  Strength and sensation are intact. Psych: Normal affect.  Accessory Clinical Findings    ECG - RSR, 72, inflat twi - no acute changes.  Assessment & Plan    1.  NSTEMI, subsequent episode of care/CAD:  S/p nstemi in December with ostial small vessel Diag dzs  not amenable to PCI.  She has had at least one episode of c/p lasting about 20 mins and resolving spontaneously.  No DOE.   She has been medically managed with asa, statin,  blocker, and long acting nitrate.  ECG stable.  She has significant fatigue since nstemi, and I continue to question whether or not this may be coming from  blocker therapy.  I have advised that she may come off of it.  If she has more c/p, we could look to titrate nitrate therapy in the future.  2.  Essential HTN:  bp stable.  Cont nitrate.  3.  HL:  LDL 55 12/6.  Cont lipitor. F/u lipids/lft's in a few wks.  4.  DM II:  With recent hyperglycemia followed by significant hypoglycemia after being placed on insulin.  Now not taking insulin.  Sugars have been better @ home.  She has f/u with primary care on Friday.  5. CKD III:  Creat stable by labs 1/11.  6.  Dispo:  F/u lipids/lft's in a few wks.  F/u w/ Dr. Martinique in 3 mos or sooner if necessary.  Murray Hodgkins, NP 12/11/2016, 5:16 PM

## 2016-12-11 NOTE — Patient Instructions (Addendum)
Medication Instructions:  Your physician has recommended you make the following change in your medication:  1. STOP the Metoprolol  Labwork: 1-2 WEEKS:  FASTING LIPID & LFT Testing/Procedures: None ordered  Follow-Up: Your physician recommends that you schedule a follow-up appointment in: 2-3 MONTHS WITH DR. Martinique   Any Other Special Instructions Will Be Listed Below (If Applicable).     If you need a refill on your cardiac medications before your next appointment, please call your pharmacy.

## 2016-12-13 ENCOUNTER — Ambulatory Visit (INDEPENDENT_AMBULATORY_CARE_PROVIDER_SITE_OTHER): Payer: 59 | Admitting: Physician Assistant

## 2016-12-13 ENCOUNTER — Encounter: Payer: Self-pay | Admitting: Physician Assistant

## 2016-12-13 VITALS — BP 120/76 | HR 69 | Temp 97.3°F | Resp 14 | Ht 67.0 in | Wt 165.6 lb

## 2016-12-13 DIAGNOSIS — I214 Non-ST elevation (NSTEMI) myocardial infarction: Secondary | ICD-10-CM

## 2016-12-13 DIAGNOSIS — R531 Weakness: Secondary | ICD-10-CM

## 2016-12-13 DIAGNOSIS — E1122 Type 2 diabetes mellitus with diabetic chronic kidney disease: Secondary | ICD-10-CM | POA: Diagnosis not present

## 2016-12-13 DIAGNOSIS — R2689 Other abnormalities of gait and mobility: Secondary | ICD-10-CM

## 2016-12-13 DIAGNOSIS — N182 Chronic kidney disease, stage 2 (mild): Secondary | ICD-10-CM

## 2016-12-13 DIAGNOSIS — R413 Other amnesia: Secondary | ICD-10-CM

## 2016-12-13 LAB — ROCKY MTN SPOTTED FVR ABS PNL(IGG+IGM)
RMSF IGM: NOT DETECTED
RMSF IgG: NOT DETECTED

## 2016-12-13 NOTE — Patient Instructions (Signed)
We will stop the insulin mix due to insulin sensitivity/lhypoglycemia Will start long acting insulin samples This affects your morning sugar so that is what you will be using to adjust it  Start at 3 units in the morning, this is a low dose and I'm expecting you to go higher.  Every 3 days you can increase your insulin by 2 units IF your MORNING fasting sugar is above 160 consistently  For meal time sugars if 2 hours after eating it is higher than 300 you can take the glipizide The glipizide CAN cause low blood sugars so if you have ANY low blood sugars below 120 STOP this medication  Somogyi effect The brain needs two things: oxygen and sugar. If the blood sugar level drops too low in the early morning hours, hormones (such as growth hormone, cortisol, and catecholamines) are released to make sure you brain can still function. These help reverse the low blood sugar level but may lead to blood sugar levels that are higher than normal in the morning. This is common for patient that take insulin at night or do not ear regular snacks.  Please schedule to get up in the middle of the night to check your blood sugar.   This may be happening to you. Please eat a high protein night time snack and we will be decreasing your night time insulin as follows:     Hypoglycemia  Hypoglycemia is when the sugar (glucose) level in the blood is too low. Symptoms of low blood sugar may include:  Feeling:  Hungry.  Worried or nervous (anxious).  Sweaty and clammy.  Confused.  Dizzy.  Sleepy.  Sick to your stomach (nauseous).  Having:  A fast heartbeat.  A headache.  A change in your vision.  Jerky movements that you cannot control (seizure).  Nightmares.  Tingling or no feeling (numbness) around the mouth, lips, or tongue.  Having trouble with:  Talking.  Paying attention (concentrating).  Moving (coordination).  Sleeping.  Shaking.  Passing out (fainting).  Getting upset  easily (irritability). Low blood sugar can happen to people who have diabetes and people who do not have diabetes. Low blood sugar can happen quickly, and it can be an emergency. Treating Low Blood Sugar  Low blood sugar is often treated by eating or drinking something sugary right away. If you can think clearly and swallow safely, follow the 15:15 rule:  Take 15 grams of a fast-acting carb (carbohydrate). Some fast-acting carbs are:  1 tube of glucose gel.  3 sugar tablets (glucose pills).  6-8 pieces of hard candy.  4 oz (120 mL) of fruit juice.  4 oz (120 mL) of regular (not diet) soda.  Check your blood sugar 15 minutes after you take the carb.  If your blood sugar is still at or below 70 mg/dL (3.9 mmol/L), take 15 grams of a carb again.  If your blood sugar does not go above 70 mg/dL (3.9 mmol/L) after 3 tries, get help right away.  After your blood sugar goes back to normal, eat a meal or a snack within 1 hour. Treating Very Low Blood Sugar  If your blood sugar is at or below 54 mg/dL (3 mmol/L), you have very low blood sugar (severe hypoglycemia). This is an emergency. Do not wait to see if the symptoms will go away. Get medical help right away. Call your local emergency services (911 in the [place] Do not drive yourself to the hospital. If you have very low  blood sugar and you cannot eat or drink, you may need a glucagon shot (injection). A family member or friend should learn how to check your blood sugar and how to give you a glucagon shot. Ask your doctor if you need to have a glucagon shot kit at home. Follow these instructions at home: General instructions  Avoid any diets that cause you to not eat enough food. Talk with your doctor before you start any new diet.  Take over-the-counter and prescription medicines only as told by your doctor.  Limit alcohol to no more than 1 drink per day for nonpregnant women and 2 drinks per day for men. One drink equals 12 oz of beer,  5 oz of wine, or 1 oz of hard liquor.  Keep all follow-up visits as told by your doctor. This is important. If You Have Diabetes:   Make sure you know the symptoms of low blood sugar.  Always keep a source of sugar with you, such as:  Sugar.  Sugar tablets.  Glucose gel.  Fruit juice.  Regular soda (not diet soda).  Milk.  Hard candy.  Honey.  Take your medicines as told.  Follow your exercise and meal plan.  Eat on time. Do not skip meals.  Follow your sick day plan when you cannot eat or drink normally. Make this plan ahead of time with your doctor.  Check your blood sugar as often as told by your doctor. Always check before and after exercise.  Share your diabetes care plan with:  Your work or school.  People you live with.  Check your pee (urine) for ketones:  When you are sick.  As told by your doctor.  Carry a card or wear jewelry that says you have diabetes. If You Have Low Blood Sugar From Other Causes:   Check your blood sugar as often as told by your doctor.  Follow instructions from your doctor about what you cannot eat or drink. Contact a doctor if:  You have trouble keeping your blood sugar in your target range.  You have low blood sugar often. Get help right away if:  You still have symptoms after you eat or drink something sugary.  Your blood sugar is at or below 54 mg/dL (3 mmol/L).  You have jerky movements that you cannot control.  You pass out. These symptoms may be an emergency. Do not wait to see if the symptoms will go away. Get medical help right away. Call your local emergency services (911 in the [place] Do not drive yourself to the hospital.  This information is not intended to replace advice given to you by your health care provider. Make sure you discuss any questions you have with your health care provider. Document Released: 01/29/2010 Document Revised: 04/11/2016 Document Reviewed: 12/08/2015 Elsevier Interactive  Patient Education  2017 [employer].

## 2016-12-13 NOTE — Progress Notes (Signed)
Assessment and Plan: 1. Type 2 diabetes mellitus with stage 2 chronic kidney disease, without long-term current use of insulin (HCC) Will start her on long acting insulin lantus, low dose, start 3 units and increase by 2 units every 3 days if morning sugars is above 180, discussed how this only effects MORNING glucose and not post pranial, can take glipizide 1/2 pill if glucose is over 300 2 hours after a meal Everything written down for the patient, given glucose tabs and reminded that if EVER a sugar below 120, do not take glipizide or insulin - Glutamic acid decarboxylase auto abs - Insulin antibodies, blood  2. NSTEMI (non-ST elevated myocardial infarction) [facility]) Continue medical management including getting her DM under control, continue follow up cardio, may consider referring to endocrine   3. Weakness ? From cholangiocarcinoma versus recent MI versus uncontrolled DM Will try to get DM under be better control, continue cardio fuu, continue follow up at [facility] Will work on disability  4. Memory deficit, imbalance Some imbalance/+ romberg, abnormal horizontal nystagmus no focal defits Will get MRI brain to rule out METS, CVA, MASS, etc Memory more likely from traumatic event/MI/hypoglycemia, rule out other causes.  - Copper, serum - Sedimentation rate - Rocky mtn spotted fvr abs pnl(IgG+IgM) - Lyme Aby, Wstrn. Blt. IgG & IgM w/bands - CK - Carboxyhemoglobin - AMMONIA - Vitamin B12 - Ferritin - Iron  5. Imbalance - MR Brain W Wo Contrast; Future    HPI 61 y.o.female presents for follow up from new start of insulin.   She had NSTEMI Dec 25th and afterwards was having sugars as high as 500's, was started on 75/25 insulin 10 and 15. Had hypoglycemia for several days and did not adjust insulin as discussed, susequently, husband found her on the floor, EMS came and she had a sugar of 17, given dexatrose and has been off the insulin since that time. For her NSTEMI she is being  treated medically, so her sugars need to be under better control. She has had 1 episode of angina, felt like an elephant sitting her chest for 20 mins that subsided. She has had extreme fatigue since her MI and has not regained her strength. In addition she continues to follow up with [facility] for her cholangiocarcinoma. She has been having dehydration, very thristy, cotton in her mouth.   She also complains of memory issues, she had trouble remembering how to spell ACE, have trouble with switching words while writing, and she is having trouble thinking of words, she also states every taste like metal to her recently.  She has had a normal ferritin, normal iron, ANA, AntiDNA, sjogren's negative, rheumatoid negative. Had MRI brain 2015  She is currently on short term disability however with her cholangioarcinoma, her CAD with angina, DM2, we will see about getting long term disability for the patient.    Past Medical History:  Diagnosis Date  . Anemia    History of blood transfusion which is when pt reports being exposed to hepatitis in 2009.  Marland Kitchen Anxiety   . Asthma   . CAD (coronary artery disease)    a. 03/2016 St Echo: nl EF, no wma-->Nl study;  b. 11/13/16 NSTEMI/Cath: EF 55-65%, 40% prox RCA, 20% prox-mid LAD, 90% D2 lesion. The culprit for the NSTEMI appeared to be the 90% ostial stenosis in second diagonal which is a 2 mm in diameter and supplies a relatively small territory. Managed medically.  . Cancer ([place])    Klatkin bile duct  ca (2009)   . CKD (chronic kidney disease), stage III   . Diabetes mellitus   . GERD (gastroesophageal reflux disease)   . H/O gastric bypass   . Hepatitis   . Hyperlipidemia   . Hypertension   . Hypothyroid   . PVC's (premature ventricular contractions)      Allergies  Allergen Reactions  . Ace Inhibitors Anaphylaxis  . Tilade [Nedocromil] Anaphylaxis  . Reglan [Metoclopramide] Nausea And Vomiting  . Betadine [Povidone Iodine] Rash    iching      Current Outpatient Prescriptions on File Prior to Visit  Medication Sig  . albuterol (VENTOLIN HFA) 108 (90 BASE) MCG/ACT inhaler Inhale 2 puffs into the lungs every 4 (four) hours as needed for wheezing or shortness of breath.  . ALPRAZolam (XANAX) 1 MG tablet Take 1 mg by mouth 3 (three) times daily as needed for anxiety.  Marland Kitchen amitriptyline (ELAVIL) 25 MG tablet Take 25 mg by mouth at bedtime.  Marland Kitchen antiseptic oral rinse (BIOTENE) LIQD 15 mLs by Mouth Rinse route as needed for dry mouth.  Marland Kitchen aspirin EC 81 MG EC tablet Take 1 tablet (81 mg total) by mouth daily.  Marland Kitchen atorvastatin (LIPITOR) 40 MG tablet Take 1 tablet (40 mg total) by mouth every evening.  . Blood Glucose Monitoring Suppl (ONE TOUCH ULTRA SYSTEM KIT) w/Device KIT 1 kit by Does not apply route once.  . calcium citrate-vitamin D (CITRACAL+D) 315-200 MG-UNIT per tablet Take 1 tablet by mouth 3 (three) times daily.  . cetirizine (ZYRTEC) 10 MG tablet Take 10 mg by mouth every morning.    . cholecalciferol (VITAMIN D) 1000 units tablet Take 1,000 Units by mouth daily.   . ergocalciferol (VITAMIN D2) 50000 units capsule Take 50,000 Units by mouth daily. DOES NOT TAKE ON SUNDAYS  . fluticasone (FLONASE) 50 MCG/ACT nasal spray PLACE 2 SPRAYS INTO BOTH NOSTRILS DAILY.  Marland Kitchen glipiZIDE (GLUCOTROL) 5 MG tablet Take 5 mg by mouth daily before breakfast.  . glucose blood (ONE TOUCH TEST STRIPS) test strip CHECK BLOOD SUGAR 3 TIMES DAILY DUE TO POORLY CONTROLLED GLUCOSE-DX-E11.65  . isosorbide mononitrate (IMDUR) 30 MG 24 hr tablet Take 1 tablet (30 mg total) by mouth daily.  . Lancets (ONETOUCH ULTRASOFT) lancets Check blood sugar 3 times daily due to poorly controlled glucose-DX-E11.65.  Marland Kitchen levothyroxine (SYNTHROID, LEVOTHROID) 200 MCG tablet Take 200 mcg by mouth daily before breakfast. PT ALTERNATES 1/2 TABLET QOD WITH 1 TABLET QOD  . lipase/protease/amylase (CREON) 12000 units CPEP capsule TAKE 1 CAPSULE BY MOUTH 3 TIMES DAILY.  . metFORMIN  (GLUCOPHAGE) 500 MG tablet TAKE 1-2 TABLETS BY MOUTH 3 TIMES DAILY. TAKE 1 TAB AT BREAKFAST, 1 AT LUNCH AND 2 AT [facility]  . Multiple Vitamins-Minerals (MULTIVITAMIN WITH MINERALS) tablet Take 1 tablet by mouth daily.   . nitroGLYCERIN (NITROSTAT) 0.4 MG SL tablet Place 1 tablet (0.4 mg total) under the tongue every 5 (five) minutes as needed for chest pain. Up to 3 doses  . OVER THE COUNTER MEDICATION Take 1 tablet by mouth daily. Calcium,magnesium and zinc in one.  . pantoprazole (PROTONIX) 40 MG tablet Take 40 mg by mouth every morning.  . sitaGLIPtin (JANUVIA) 100 MG tablet Take 1 tablet (100 mg total) by mouth daily.  . sucralfate (CARAFATE) 1 g tablet TAKE 1 TABLET BY MOUTH 4 TIMES DAILY.   No current facility-administered medications on file prior to visit.     ROS: all negative except above.   Physical Exam: There were no vitals filed  for this visit. There were no vitals taken for this visit. General Appearance: Well nourished, in no apparent distress. Eyes: PERRLA, EOMs, conjunctiva no swelling or erythema Sinuses: No Frontal/maxillary tenderness ENT/Mouth: Ext aud canals clear, TMs without erythema, bulging. No erythema, swelling, or exudate on post pharynx.  Tonsils not swollen or erythematous. Hearing normal.  Neck: Supple, thyroid normal.  Respiratory: Respiratory effort normal, BS equal bilaterally without rales, rhonchi, wheezing or stridor.  Cardio: RRR with no MRGs. Brisk peripheral pulses without edema.  Abdomen: Soft, + BS.  Non tender, no guarding, rebound, hernias, masses. Lymphatics: Non tender without lymphadenopathy.  Musculoskeletal: Full ROM, 5/5 strength, normal gait.  Skin: Warm, dry without rashes, lesions, ecchymosis.  Neuro: Cranial nerves intact, abnormal horizontal nystagmus, + romberg. Normal muscle tone, no cerebellar symptoms. Sensation decreased bilateral feet.  Psych: Awake and oriented X 3, normal affect, Insight and Judgment appropriate.     Vicie Mutters, PA-C 9:45 AM [facility]

## 2016-12-14 LAB — IRON: Iron: 73 ug/dL (ref 45–160)

## 2016-12-14 LAB — FERRITIN: Ferritin: 55 ng/mL (ref 20–288)

## 2016-12-14 LAB — SEDIMENTATION RATE: Sed Rate: 3 mm/hr (ref 0–30)

## 2016-12-14 LAB — CK: CK TOTAL: 22 U/L (ref 7–177)

## 2016-12-14 LAB — AMMONIA: Ammonia: <11 umol/L (ref <=47)

## 2016-12-14 LAB — VITAMIN B12: Vitamin B-12: 648 pg/mL (ref 200–1100)

## 2016-12-15 LAB — GLUTAMIC ACID DECARBOXYLASE AUTO ABS: Glutamic Acid Decarb Ab: <5 IU/mL (ref <5)

## 2016-12-15 LAB — COPPER, SERUM: Copper: 85 ug/dL (ref 70–175)

## 2016-12-16 ENCOUNTER — Other Ambulatory Visit: Payer: Self-pay

## 2016-12-16 ENCOUNTER — Other Ambulatory Visit: Payer: Self-pay | Admitting: Physician Assistant

## 2016-12-16 ENCOUNTER — Emergency Department (HOSPITAL_COMMUNITY): Payer: 59

## 2016-12-16 ENCOUNTER — Encounter: Payer: Self-pay | Admitting: Physician Assistant

## 2016-12-16 ENCOUNTER — Encounter (HOSPITAL_COMMUNITY): Payer: Self-pay | Admitting: Emergency Medicine

## 2016-12-16 DIAGNOSIS — E039 Hypothyroidism, unspecified: Secondary | ICD-10-CM | POA: Diagnosis not present

## 2016-12-16 DIAGNOSIS — Z8509 Personal history of malignant neoplasm of other digestive organs: Secondary | ICD-10-CM | POA: Diagnosis not present

## 2016-12-16 DIAGNOSIS — R079 Chest pain, unspecified: Secondary | ICD-10-CM | POA: Diagnosis not present

## 2016-12-16 DIAGNOSIS — E1122 Type 2 diabetes mellitus with diabetic chronic kidney disease: Secondary | ICD-10-CM | POA: Insufficient documentation

## 2016-12-16 DIAGNOSIS — Z87891 Personal history of nicotine dependence: Secondary | ICD-10-CM | POA: Insufficient documentation

## 2016-12-16 DIAGNOSIS — I2510 Atherosclerotic heart disease of native coronary artery without angina pectoris: Secondary | ICD-10-CM | POA: Diagnosis not present

## 2016-12-16 DIAGNOSIS — J45909 Unspecified asthma, uncomplicated: Secondary | ICD-10-CM | POA: Diagnosis not present

## 2016-12-16 DIAGNOSIS — I129 Hypertensive chronic kidney disease with stage 1 through stage 4 chronic kidney disease, or unspecified chronic kidney disease: Secondary | ICD-10-CM | POA: Insufficient documentation

## 2016-12-16 DIAGNOSIS — I252 Old myocardial infarction: Secondary | ICD-10-CM | POA: Insufficient documentation

## 2016-12-16 DIAGNOSIS — Z7982 Long term (current) use of aspirin: Secondary | ICD-10-CM | POA: Insufficient documentation

## 2016-12-16 DIAGNOSIS — N183 Chronic kidney disease, stage 3 (moderate): Secondary | ICD-10-CM | POA: Diagnosis not present

## 2016-12-16 DIAGNOSIS — Z7984 Long term (current) use of oral hypoglycemic drugs: Secondary | ICD-10-CM | POA: Diagnosis not present

## 2016-12-16 LAB — BASIC METABOLIC PANEL
ANION GAP: 12 (ref 5–15)
BUN: 11 mg/dL (ref 6–20)
CALCIUM: 9.1 mg/dL (ref 8.9–10.3)
CO2: 28 mmol/L (ref 22–32)
Chloride: 99 mmol/L — ABNORMAL LOW (ref 101–111)
Creatinine, Ser: 1.25 mg/dL — ABNORMAL HIGH (ref 0.44–1.00)
GFR calc non Af Amer: 45 mL/min — ABNORMAL LOW (ref >60)
GFR, EST AFRICAN AMERICAN: 53 mL/min — AB (ref >60)
GLUCOSE: 188 mg/dL — AB (ref 65–99)
POTASSIUM: 3.5 mmol/L (ref 3.5–5.1)
Sodium: 139 mmol/L (ref 135–145)

## 2016-12-16 LAB — CBC
HEMATOCRIT: 39.2 % (ref 36.0–46.0)
HEMOGLOBIN: 12.6 g/dL (ref 12.0–15.0)
MCH: 28.6 pg (ref 26.0–34.0)
MCHC: 32.1 g/dL (ref 30.0–36.0)
MCV: 88.9 fL (ref 78.0–100.0)
Platelets: 249 10*3/uL (ref 150–400)
RBC: 4.41 MIL/uL (ref 3.87–5.11)
RDW: 14.1 % (ref 11.5–15.5)
WBC: 8.7 10*3/uL (ref 4.0–10.5)

## 2016-12-16 LAB — LYME ABY, WSTRN BLT IGG & IGM W/BANDS
B BURGDORFERI IGM ABS (IB): NEGATIVE
B burgdorferi IgG Abs (IB): NEGATIVE
LYME DISEASE 23 KD IGM: NONREACTIVE
LYME DISEASE 30 KD IGG: NONREACTIVE
LYME DISEASE 39 KD IGG: NONREACTIVE
LYME DISEASE 45 KD IGG: NONREACTIVE
LYME DISEASE 58 KD IGG: NONREACTIVE
LYME DISEASE 93 KD IGG: NONREACTIVE
Lyme Disease 18 kD IgG: NONREACTIVE
Lyme Disease 23 kD IgG: NONREACTIVE
Lyme Disease 28 kD IgG: NONREACTIVE
Lyme Disease 39 kD IgM: NONREACTIVE
Lyme Disease 41 kD IgG: REACTIVE — AB
Lyme Disease 41 kD IgM: NONREACTIVE
Lyme Disease 66 kD IgG: NONREACTIVE

## 2016-12-16 NOTE — ED Triage Notes (Signed)
Patient reports left chest pain with SOB and nausea onset this afternoon , she took 3 NTG sl prior to arrival with slight relief , denies cough or diaphoresis , history of CAD - her cardiologist is Dr. Mamie Nick. [place] .

## 2016-12-17 ENCOUNTER — Ambulatory Visit (INDEPENDENT_AMBULATORY_CARE_PROVIDER_SITE_OTHER): Payer: Managed Care, Other (non HMO) | Admitting: Podiatry

## 2016-12-17 ENCOUNTER — Encounter: Payer: Self-pay | Admitting: Podiatry

## 2016-12-17 ENCOUNTER — Emergency Department (HOSPITAL_COMMUNITY)
Admission: EM | Admit: 2016-12-17 | Discharge: 2016-12-17 | Disposition: A | Discharge status: Home / Self Care | Payer: 59 | Attending: Emergency Medicine | Admitting: Emergency Medicine

## 2016-12-17 DIAGNOSIS — Q828 Other specified congenital malformations of skin: Secondary | ICD-10-CM

## 2016-12-17 DIAGNOSIS — M79676 Pain in unspecified toe(s): Secondary | ICD-10-CM | POA: Diagnosis not present

## 2016-12-17 DIAGNOSIS — B351 Tinea unguium: Secondary | ICD-10-CM

## 2016-12-17 DIAGNOSIS — E119 Type 2 diabetes mellitus without complications: Secondary | ICD-10-CM

## 2016-12-17 DIAGNOSIS — R079 Chest pain, unspecified: Secondary | ICD-10-CM

## 2016-12-17 LAB — I-STAT TROPONIN, ED
TROPONIN I, POC: 0 ng/mL (ref 0.00–0.08)
TROPONIN I, POC: 0.01 ng/mL (ref 0.00–0.08)

## 2016-12-17 LAB — HEPATIC FUNCTION PANEL
ALT: 26 U/L (ref 14–54)
AST: 21 U/L (ref 15–41)
Albumin: 2.6 g/dL — ABNORMAL LOW (ref 3.5–5.0)
Alkaline Phosphatase: 76 U/L (ref 38–126)
BILIRUBIN DIRECT: 0.1 mg/dL (ref 0.1–0.5)
Indirect Bilirubin: 0.5 mg/dL (ref 0.3–0.9)
TOTAL PROTEIN: 4.8 g/dL — AB (ref 6.5–8.1)
Total Bilirubin: 0.6 mg/dL (ref 0.3–1.2)

## 2016-12-17 LAB — LIPASE, BLOOD: Lipase: 18 U/L (ref 11–51)

## 2016-12-17 LAB — CBG MONITORING, ED: GLUCOSE-CAPILLARY: 189 mg/dL — AB (ref 65–99)

## 2016-12-17 LAB — CARBOXYHEMOGLOBIN: Carboxyhemoglobin: 2 %TOTAL HGB (ref <=12)

## 2016-12-17 NOTE — ED Notes (Signed)
Patient Alert and oriented X4. Stable and ambulatory. Patient verbalized understanding of the discharge instructions.  Patient belongings were taken by the patient.  

## 2016-12-17 NOTE — ED Provider Notes (Signed)
[facility] DEPT Provider Note   CSN: [id number] Arrival date & time: 12/16/16  1933  By signing my name below, I, Arianna Nassar, attest that this documentation has been prepared under the direction and in the presence of Merryl Hacker, MD.  Electronically Signed: Julien Nordmann, ED Scribe. 12/17/16. 2:48 AM.    History   Chief Complaint Chief Complaint  Patient presents with  . Chest Pain   The history is provided by the patient. No language interpreter was used.   HPI Comments: Courtney Stevens is a 61 y.o. female who has a PMhx of CAD, CKD, DM, GERD, HTN, HLD, hypothyroidism, PVC, and DMII presents to the Emergency Department complaining of moderate, intermittent, left sided chest pain that began this evening ~ 5:30 pm. She notes associated abdominal pain, shortness of breath and nausea. She has been pain free for the past 3-4 hours. Pt was seen and evaluated on 11/11/16 for similar presentation of her current symptoms that led to a diagnoses of NSTEMI. Pt was admitted and had a left heart catheterization performed by Dr. Fletcher Anon. She says that her current symptoms feel very similar just as severe. Pt has taken 3 NTG to alleviate her pain without relief. She currently takes one aspirin. Pt denies vomiting.   PCP: [name], MD   Past Medical History:  Diagnosis Date  . Anemia    History of blood transfusion which is when pt reports being exposed to hepatitis in 2009.  Marland Kitchen Anxiety   . Asthma   . CAD (coronary artery disease)    a. 03/2016 St Echo: nl EF, no wma-->Nl study;  b. 11/13/16 NSTEMI/Cath: EF 55-65%, 40% prox RCA, 20% prox-mid LAD, 90% D2 lesion. The culprit for the NSTEMI appeared to be the 90% ostial stenosis in second diagonal which is a 2 mm in diameter and supplies a relatively small territory. Managed medically.  . Cancer ([place])    Klatkin bile duct ca (2009)   . CKD (chronic kidney disease), stage III   . Diabetes mellitus   . GERD (gastroesophageal  reflux disease)   . H/O gastric bypass   . Hepatitis   . Hyperlipidemia   . Hypertension   . Hypothyroid   . PVC's (premature ventricular contractions)     Patient Active Problem List   Diagnosis Date Noted  . CKD (chronic kidney disease), stage III 11/14/2016  . CAD in native artery 11/14/2016  . PVC's (premature ventricular contractions) 11/14/2016  . NSTEMI (non-ST elevated myocardial infarction) ([place]) 11/12/2016  . Hypokalemia 11/12/2016  . Chest pain with moderate risk for cardiac etiology 03/25/2016  . BMI 25.0-25.9,adult 10/16/2015  . GERD 07/23/2015  . Vitamin D deficiency 03/24/2014  . Medication management 03/24/2014  . 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 Hypothyroidism 11/25/2013  . Essential hypertension 11/03/2013  . Hyperlipidemia 11/03/2013  . 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 Multiple pulmonary nodules 03/06/2013  . Personal history of colonic polyps 03/03/2013  . 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 Type II diabetes mellitus with renal manifestations  04/03/2010  . PANCREATIC INSUFFICIENCY 04/03/2010    Past Surgical History:  Procedure Laterality Date  . ABDOMINAL HYSTERECTOMY  1990   BIL OOPHORECTOMY  . APPENDECTOMY    . Bile Duct Resection  2009  . BREAST SURGERY     BIOPSY NEG  . BUNIONECTOMY Right 2005  . CARDIAC CATHETERIZATION N/A 11/13/2016   Procedure: Left Heart Cath and Coronary Angiography;  Surgeon: Wellington Hampshire, MD;  Location: [facility];  Service: Cardiovascular;  Laterality: N/A;  . CHOLECYSTECTOMY  1992  . GASTRIC BYPASS  2004  . TONSILLECTOMY    . TUBAL LIGATION    . VIDEO BRONCHOSCOPY WITH ENDOBRONCHIAL ULTRASOUND N/A 10/12/2014   Procedure: VIDEO BRONCHOSCOPY WITH ENDOBRONCHIAL ULTRASOUND;  Surgeon: Brand Males, MD;  Location: [facility] OR;  Service: Thoracic;  Laterality: N/A;    OB History    No data available       Home  Medications    Prior to Admission medications   Medication Sig Start Date End Date Taking? Authorizing Provider  albuterol (VENTOLIN HFA) 108 (90 BASE) MCG/ACT inhaler Inhale 2 puffs into the lungs every 4 (four) hours as needed for wheezing or shortness of breath. 09/08/15  Yes Vicie Mutters, PA-C  amitriptyline (ELAVIL) 25 MG tablet Take 25 mg by mouth at bedtime.   Yes Historical Provider, MD  antiseptic oral rinse (BIOTENE) LIQD 15 mLs by Mouth Rinse route as needed for dry mouth.   Yes Historical Provider, MD  aspirin EC 81 MG EC tablet Take 1 tablet (81 mg total) by mouth daily. 11/15/16  Yes Dayna N Dunn, PA-C  atorvastatin (LIPITOR) 40 MG tablet Take 1 tablet (40 mg total) by mouth every evening. 11/14/16  Yes Dayna N Dunn, PA-C  calcium citrate-vitamin D (CITRACAL+D) 315-200 MG-UNIT per tablet Take 1 tablet by mouth 3 (three) times daily.   Yes Historical Provider, MD  cetirizine (ZYRTEC) 10 MG tablet Take 10 mg by mouth every morning.     Yes Historical Provider, MD  cholecalciferol (VITAMIN D) 1000 units tablet Take 1,000 Units by mouth daily.    Yes Historical Provider, MD  ergocalciferol (VITAMIN D2) 50000 units capsule Take 50,000 Units by mouth daily. DOES NOT TAKE ON SUNDAYS   Yes Historical Provider, MD  fluticasone (FLONASE) 50 MCG/ACT nasal spray PLACE 2 SPRAYS INTO BOTH NOSTRILS DAILY. 10/15/16  Yes Brand Males, MD  glipiZIDE (GLUCOTROL) 5 MG tablet Take 5 mg by mouth daily before breakfast.   Yes Historical Provider, MD  isosorbide mononitrate (IMDUR) 30 MG 24 hr tablet Take 1 tablet (30 mg total) by mouth daily. 11/15/16  Yes Dayna N Dunn, PA-C  levothyroxine (SYNTHROID, LEVOTHROID) 200 MCG tablet Take 200 mcg by mouth daily before breakfast. PT ALTERNATES 1/2 TABLET QOD WITH 1 TABLET QOD   Yes Historical Provider, MD  lipase/protease/amylase (CREON) 12000 units CPEP capsule TAKE 1 CAPSULE BY MOUTH 3 TIMES DAILY. 07/02/16  Yes Unk Pinto, MD  metFORMIN (GLUCOPHAGE)  500 MG tablet TAKE 1-2 TABLETS BY MOUTH 3 TIMES DAILY. TAKE 1 TAB AT BREAKFAST, 1 AT LUNCH AND 2 AT [facility] Patient taking differently: Take 500-1,000 mg by mouth See admin instructions. TAKE 1-2 TABLETS BY MOUTH 3 TIMES DAILY. TAKE 1 TAB AT BREAKFAST, 1 AT LUNCH AND 2 AT [facility] 07/02/16  Yes Unk Pinto, MD  Multiple Vitamins-Minerals (MULTIVITAMIN WITH MINERALS) tablet Take 1 tablet by mouth daily.    Yes Historical Provider, MD  nitroGLYCERIN (NITROSTAT) 0.4 MG SL tablet Place 1 tablet (0.4 mg total) under the tongue every 5 (five) minutes as needed for chest pain. Up to 3 doses 11/14/16  Yes Dayna N Dunn, PA-C  OVER THE COUNTER MEDICATION Take 1 tablet by mouth daily. Calcium,magnesium and zinc in one.   Yes Historical Provider, MD  pantoprazole (PROTONIX) 40 MG tablet Take 40 mg by mouth every morning. 09/07/16  Yes Historical Provider, MD  sucralfate (CARAFATE) 1  g tablet TAKE 1 TABLET BY MOUTH 4 TIMES DAILY. 07/02/16  Yes Unk Pinto, MD  ALPRAZolam Duanne Moron) 1 MG tablet Take 1 mg by mouth 3 (three) times daily as needed for anxiety.    Historical Provider, MD  Blood Glucose Monitoring Suppl (ONE TOUCH ULTRA SYSTEM KIT) w/Device KIT 1 kit by Does not apply route once. 01/08/16   Unk Pinto, MD  glucose blood (ONE TOUCH TEST STRIPS) test strip CHECK BLOOD SUGAR 3 TIMES DAILY DUE TO POORLY CONTROLLED GLUCOSE-DX-E11.65 01/08/16   Unk Pinto, MD  Lancets [facility] ULTRASOFT) lancets Check blood sugar 3 times daily due to poorly controlled glucose-DX-E11.65. 01/08/16   Unk Pinto, MD    Family History Family History  Problem Relation Age of Onset  . Colon cancer Mother   . Hypertension Mother   . Glaucoma Mother   . Diabetes Father   . Alzheimer's disease Father   . Heart attack Father 69  . Nephrolithiasis Father   . Emphysema Father     was a smoker  . Heart disease Father   . Nephrolithiasis Sister   . Heart attack Brother 1  . Nephrolithiasis Brother     Social  History Social History  Substance Use Topics  . Smoking status: Former Smoker    Packs/day: 2.00    Years: 5.00    Types: Cigarettes    Quit date: 11/18/1977  . Smokeless tobacco: Never Used  . Alcohol use Yes     Comment: 2-3 glasses of wine once per month      Allergies   Ace inhibitors; Tilade [nedocromil]; Reglan [metoclopramide]; and Betadine [povidone iodine]   Review of Systems Review of Systems  Constitutional: Negative for fever.  Respiratory: Positive for shortness of breath.   Cardiovascular: Positive for chest pain.  Gastrointestinal: Positive for abdominal pain and nausea. Negative for vomiting.     Physical Exam Updated Vital Signs BP 117/63 (BP Location: Right Arm)   Pulse 69   Temp 98.7 F (37.1 C) (Oral)   Resp 16   SpO2 100%   Physical Exam  Constitutional: She is oriented to person, place, and time. She appears well-developed and well-nourished. No distress.  HENT:  Head: Normocephalic and atraumatic.  Cardiovascular: Normal rate, regular rhythm and normal heart sounds.   Pulmonary/Chest: Effort normal and breath sounds normal. No respiratory distress. She has no wheezes.  Abdominal: Soft. Bowel sounds are normal. There is no tenderness. There is no guarding.  Musculoskeletal: She exhibits no edema.  Neurological: She is alert and oriented to person, place, and time.  Skin: Skin is warm and dry.  Psychiatric: She has a normal mood and affect.  Nursing note and vitals reviewed.    ED Treatments / Results  DIAGNOSTIC STUDIES: Oxygen Saturation is 100% on RA, normal by my interpretation.  COORDINATION OF CARE:  2:46 AM Discussed treatment plan with pt at bedside and pt agreed to plan.  Labs (all labs ordered are listed, but only abnormal results are displayed) Labs Reviewed  BASIC METABOLIC PANEL - Abnormal; Notable for the following:       Result Value   Chloride 99 (*)    Glucose, Bld 188 (*)    Creatinine, Ser 1.25 (*)    GFR calc non  Af Amer 45 (*)    GFR calc Af Amer 53 (*)    All other components within normal limits  HEPATIC FUNCTION PANEL - Abnormal; Notable for the following:    Total Protein 4.8 (*)  Albumin 2.6 (*)    All other components within normal limits  CBG MONITORING, ED - Abnormal; Notable for the following:    Glucose-Capillary 189 (*)    All other components within normal limits  CBC  LIPASE, BLOOD  I-STAT TROPOININ, ED  I-STAT TROPOININ, ED    EKG  EKG Interpretation  Date/Time:  Monday December 16 2016 19:54:39 EST Ventricular Rate:  65 PR Interval:  116 QRS Duration: 94 QT Interval:  442 QTC Calculation: 459 R Axis:   66 Text Interpretation:  Normal sinus rhythm Nonspecific T wave abnormality Abnormal ECG Confirmed by Winfred Leeds  MD, SAM [phone]) on 12/16/2016 8:10:54 PM       Radiology Dg Chest 2 View  Result Date: 12/16/2016 CLINICAL DATA:  Shortness of breath, nausea EXAM: CHEST  2 VIEW COMPARISON:  None. FINDINGS: The heart size and mediastinal contours are within normal limits. Both lungs are clear. The visualized skeletal structures are unremarkable. IMPRESSION: No active cardiopulmonary disease. Electronically Signed   By: Kathreen Devoid   On: 12/16/2016 20:51    Procedures Procedures (including critical care time)  Medications Ordered in ED Medications - No data to display   Initial Impression / Assessment and Plan / ED Course  I have reviewed the triage vital signs and the nursing notes.  Pertinent labs & imaging results that were available during my care of the patient were reviewed by me and considered in my medical decision making (see chart for details).     Patient presents with chest pain. Pain similar to when she had her heart attack in December. She is nontoxic. Vital signs reassuring. She is currently chest pain-free. Her EKG shows no signs of acute ischemia. Initial troponin negative. Cardiac catheterization reviewed. She did have one 90% lesion of the ostial  diagonal. She otherwise had a 30% and a 20% lesion. She is being medically managed. She is currently pain-free and has been for the last 3-4 hours. I discussed the patient with Dr. Aundra Dubin. Plan for repeat troponin. Patient remains chest pain-free and repeat troponin is reassuring. Follow-up closely with Dr. Martinique. Repeat troponin negative. Will discharge with close cardiology follow-up.  After history, exam, and medical workup I feel the patient has been appropriately medically screened and is safe for discharge home. Pertinent diagnoses were discussed with the patient. Patient was given return precautions.   Final Clinical Impressions(s) / ED Diagnoses   Final diagnoses:  Chest pain, unspecified type   I personally performed the services described in this documentation, which was scribed in my presence. The recorded information has been reviewed and is accurate.   New Prescriptions Discharge Medication List as of 12/17/2016  4:39 AM       Merryl Hacker, MD 12/17/16 [phone]

## 2016-12-17 NOTE — ED Notes (Signed)
Patient reports having a MI on Christmas Day - had a cardiac cath. Pt states the CP she had today and it felt like the same kind of pain as when she had the heart attack. Today's episode was accompanied by SOB, dizziness, nausea and dry heaving, and numbness in all extremities. Pt states all symptoms, including CP have resolved since she has been here. Pt is worried about it d/t her recent history.

## 2016-12-17 NOTE — ED Notes (Signed)
Family at bedside. 

## 2016-12-17 NOTE — Discharge Instructions (Signed)
You were seen today for chest pain. Your heart tests are reassuring. You need to follow-up with Dr. Martinique as soon as possible. If you have new or worsening symptoms you should be reevaluated.

## 2016-12-17 NOTE — Progress Notes (Signed)
She presents today with chief complaint of painful elongated toenails and multiple porokeratosis to the plantar aspect of the bilateral foot. She has a history of diabetes without complications. She does have now a history of coronary artery disease and a heart attack.  Objective: Vital signs are stable she is alert and oriented 3. Pulses are palpable. Neurologic sensorium is intact. Deep tendon reflexes are intact. Muscle strength is normal bilateral. Toenails are thick yellow dystrophic onychomycotic sharply incurvated and painful on palpation as well as debridement. She also has multiple reactive hyperkeratotic lesions to the plantar aspect of the bilateral foot consistent with porokeratosis and tyloma's.  Assessment: Diabetes mellitus without complications. Pain in limb secondary to onychomycosis and porokeratosis.  Plan: Debridement of all reactive hyperkeratotic tissue and debridement of toenails 1 through 5 bilateral. Follow up with her 4 months

## 2016-12-19 ENCOUNTER — Ambulatory Visit
Admission: RE | Admit: 2016-12-19 | Discharge: 2016-12-19 | Disposition: A | Discharge status: Home / Self Care | Payer: 59 | Source: Ambulatory Visit | Attending: Physician Assistant | Admitting: Physician Assistant

## 2016-12-19 DIAGNOSIS — R2689 Other abnormalities of gait and mobility: Secondary | ICD-10-CM

## 2016-12-19 MED ORDER — GADOBENATE DIMEGLUMINE 529 MG/ML IV SOLN
7.0000 mL | Freq: Once | INTRAVENOUS | Status: AC | PRN
  Administered 2016-12-19: 7 mL via INTRAVENOUS

## 2016-12-20 ENCOUNTER — Encounter: Payer: Self-pay | Admitting: Physician Assistant

## 2016-12-23 LAB — INSULIN ANTIBODIES, BLOOD

## 2016-12-25 NOTE — Progress Notes (Signed)
Office Visit    Patient Name: Courtney Stevens Date of Encounter: 12/27/2016  Primary Care Provider:  [name], MD Primary Cardiologist:  P. Martinique, MD   Chief Complaint    61 year old female  who presents for f/u CAD  Past Medical History    Past Medical History:  Diagnosis Date  . Anemia    History of blood transfusion which is when pt reports being exposed to hepatitis in 2009.  Marland Kitchen Anxiety   . Asthma   . CAD (coronary artery disease)    a. 03/2016 St Echo: nl EF, no wma-->Nl study;  b. 11/13/16 NSTEMI/Cath: EF 55-65%, 40% prox RCA, 20% prox-mid LAD, 90% D2 lesion. The culprit for the NSTEMI appeared to be the 90% ostial stenosis in second diagonal which is a 2 mm in diameter and supplies a relatively small territory. Managed medically.  . Cancer ([place])    Klatkin bile duct ca (2009)   . CKD (chronic kidney disease), stage III   . Diabetes mellitus   . GERD (gastroesophageal reflux disease)   . H/O gastric bypass   . Hepatitis   . Hyperlipidemia   . Hypertension   . Hypothyroid   . PVC's (premature ventricular contractions)    Past Surgical History:  Procedure Laterality Date  . ABDOMINAL HYSTERECTOMY  1990   BIL OOPHORECTOMY  . APPENDECTOMY    . Bile Duct Resection  2009  . BREAST SURGERY     BIOPSY NEG  . BUNIONECTOMY Right 2005  . CARDIAC CATHETERIZATION N/A 11/13/2016   Procedure: Left Heart Cath and Coronary Angiography;  Surgeon: Wellington Hampshire, MD;  Location: [place] [facility];  Service: Cardiovascular;  Laterality: N/A;  . CHOLECYSTECTOMY  1992  . GASTRIC BYPASS  2004  . TONSILLECTOMY    . TUBAL LIGATION    . VIDEO BRONCHOSCOPY WITH ENDOBRONCHIAL ULTRASOUND N/A 10/12/2014   Procedure: VIDEO BRONCHOSCOPY WITH ENDOBRONCHIAL ULTRASOUND;  Surgeon: Brand Males, MD;  Location: [facility] OR;  Service: Thoracic;  Laterality: N/A;    Allergies  Allergies  Allergen Reactions  . Ace Inhibitors Anaphylaxis  . Tilade [Nedocromil] Anaphylaxis  .  Reglan [Metoclopramide] Nausea And Vomiting  . 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 She is s/p nstemi in late December with cath revealing severe ostial D2 disease and otherwise nonobs disease.  The D2 was only a 55m vessel and thus med Rx was recommended.  She was also hypokalemic and her diuretics were adjusted.    In follow up she developed severe hyperglycemia and was placed on insulin. Later developed hypoglycemia with unresponsiveness. insulin dose was reduced. She complained of persistent fatigue and beta blocker was discontinued.   On Jan 29 she states she had a bad anginal attack. Developed SSCP unrelieved with 3 sl Ntg. Lasted 3 hours. Went to ED where Ecg and troponins were negative. No anginal attacks since then.   She continues to complain of fatigue off beta blocker- really no change. Sugars have been <200. No hypoglycemic episodes.  She does complain of pain in her right thigh that awakens her from sleep.    Home Medications    Prior to Admission medications   Medication Sig Start Date End Date Taking? Authorizing Provider  albuterol (VENTOLIN  HFA) 108 (90 BASE) MCG/ACT inhaler Inhale 2 puffs into the lungs every 4 (four) hours as needed for wheezing or shortness of breath. 09/08/15  Yes Vicie Mutters, PA-C  ALPRAZolam Duanne Moron) 1 MG tablet Take 1 mg by mouth 3 (three) times daily as needed for anxiety.   Yes Historical Provider, MD  amitriptyline (ELAVIL) 25 MG tablet Take 25 mg by mouth at bedtime.   Yes Historical Provider, MD  antiseptic oral rinse (BIOTENE) LIQD 15 mLs by Mouth Rinse route as needed for dry mouth.   Yes Historical Provider, MD  aspirin EC 81 MG EC tablet Take 1 tablet (81  mg total) by mouth daily. 11/15/16  Yes Dayna N Dunn, PA-C  atorvastatin (LIPITOR) 40 MG tablet Take 1 tablet (40 mg total) by mouth every evening. 11/14/16  Yes Dayna N Dunn, PA-C  Blood Glucose Monitoring Suppl (ONE TOUCH ULTRA SYSTEM KIT) w/Device KIT 1 kit by Does not apply route once. 01/08/16  Yes Unk Pinto, MD  calcium citrate-vitamin D (CITRACAL+D) 315-200 MG-UNIT per tablet Take 1 tablet by mouth 3 (three) times daily.   Yes Historical Provider, MD  cetirizine (ZYRTEC) 10 MG tablet Take 10 mg by mouth every morning.     Yes Historical Provider, MD  cholecalciferol (VITAMIN D) 1000 units tablet Take 1,000 Units by mouth daily.    Yes Historical Provider, MD  ergocalciferol (VITAMIN D2) 50000 units capsule Take 50,000 Units by mouth daily. DOES NOT TAKE ON SUNDAYS   Yes Historical Provider, MD  fluticasone (FLONASE) 50 MCG/ACT nasal spray PLACE 2 SPRAYS INTO BOTH NOSTRILS DAILY. 10/15/16  Yes Brand Males, MD  glipiZIDE (GLUCOTROL) 5 MG tablet Take 5 mg by mouth daily before breakfast.   Yes Historical Provider, MD  glucose blood (ONE TOUCH TEST STRIPS) test strip CHECK BLOOD SUGAR 3 TIMES DAILY DUE TO POORLY CONTROLLED GLUCOSE-DX-E11.65 01/08/16  Yes Unk Pinto, MD  isosorbide mononitrate (IMDUR) 30 MG 24 hr tablet Take 1 tablet (30 mg total) by mouth daily. 11/15/16  Yes Dayna N Dunn, PA-C  Lancets (ONETOUCH ULTRASOFT) lancets Check blood sugar 3 times daily due to poorly controlled glucose-DX-E11.65. 01/08/16  Yes Unk Pinto, MD  levothyroxine (SYNTHROID, LEVOTHROID) 200 MCG tablet Take 200 mcg by mouth daily before breakfast. PT ALTERNATES 1/2 TABLET QOD WITH 1 TABLET QOD   Yes Historical Provider, MD  lipase/protease/amylase (CREON) 12000 units CPEP capsule TAKE 1 CAPSULE BY MOUTH 3 TIMES DAILY. 07/02/16  Yes Unk Pinto, MD  metFORMIN (GLUCOPHAGE) 500 MG tablet TAKE 1-2 TABLETS BY MOUTH 3 TIMES DAILY. TAKE 1 TAB AT BREAKFAST, 1 AT LUNCH AND 2 AT [facility] 07/02/16  Yes  Unk Pinto, MD  Multiple Vitamins-Minerals (MULTIVITAMIN WITH MINERALS) tablet Take 1 tablet by mouth daily.    Yes Historical Provider, MD  nitroGLYCERIN (NITROSTAT) 0.4 MG SL tablet Place 1 tablet (0.4 mg total) under the tongue every 5 (five) minutes as needed for chest pain. Up to 3 doses 11/14/16  Yes Dayna N Dunn, PA-C  OVER THE COUNTER MEDICATION Take 1 tablet by mouth daily. Calcium,magnesium and zinc in one.   Yes Historical Provider, MD  pantoprazole (PROTONIX) 40 MG tablet Take 40 mg by mouth every morning. 09/07/16  Yes Historical Provider, MD  sitaGLIPtin (JANUVIA) 100 MG tablet Take 1 tablet (100 mg total) by mouth daily. 07/02/16  Yes Unk Pinto, MD  sucralfate (CARAFATE) 1 g tablet TAKE 1 TABLET BY MOUTH 4 TIMES DAILY. 07/02/16  Yes Unk Pinto, MD    Review of Systems  As noted in HPI.  All other systems reviewed and are otherwise negative except as noted above.  Physical Exam    VS:  BP 131/85   Pulse 84   Ht '5\' 7"'  (1.702 m)   Wt 165 lb (74.8 kg)   BMI 25.84 kg/m  , BMI Body mass index is 25.84 kg/m. GEN: Well nourished, well developed, in no acute distress.  HEENT: normal.  Neck: Supple, no JVD, carotid bruits, or masses. Cardiac: RRR, no murmurs, rubs, or gallops. No clubbing, cyanosis, edema.  Radials/DP/PT 2+ and equal bilaterally.  Respiratory:  Respirations regular and unlabored, clear to auscultation bilaterally. GI: Soft, nontender, nondistended, BS + x 4. MS: no deformity or atrophy. Skin: warm and dry, no rash. Neuro:  Strength and sensation are intact. Psych: Normal affect.  Accessory Clinical Findings    Lab Results  Component Value Date   WBC 8.7 12/16/2016   HGB 12.6 12/16/2016   HCT 39.2 12/16/2016   PLT 249 12/16/2016   GLUCOSE 188 (H) 12/16/2016   CHOL 143 10/23/2016   TRIG 190 (H) 10/23/2016   HDL 50 (L) 10/23/2016   LDLCALC 55 10/23/2016   ALT 26 12/17/2016   AST 21 12/17/2016   NA 139 12/16/2016   K 3.5 12/16/2016    CL 99 (L) 12/16/2016   CREATININE 1.25 (H) 12/16/2016   BUN 11 12/16/2016   CO2 28 12/16/2016   TSH 1.755 11/12/2016   INR 1.00 11/12/2016   HGBA1C 8.3 (H) 10/23/2016   MICROALBUR <0.2 01/25/2016     Assessment & Plan    1.  CAD:  S/p nstemi in December with ostial small vessel Diag dzs  not amenable to PCI due to small vessel size.  She had one episode of severe angina since then.  No DOE.   She has been medically managed with asa, statin, and long acting nitrate. Beta blocker stopped due to fatigue without improvement.  For now I would like to keep off beta blocker due to history of hypoglycemia. Will increase Imdur to 60 mg daily. If she continues to have angina I would add diltiazem. Encourage increased aerobic activity. She is not interested in Cardiac Rehab.   2.  Essential HTN:  bp stable.  Cont nitrate.  3.  HL:  LDL 55 12/6.  Cont lipitor. F/u fasting lab today.  4.  DM II:  Now on insulin at lower dose. Will check A1c today.  5. CKD III:  Creat stable by labs 1/11.  6.  Dispo:  F/u  in 3 mos or sooner if necessary.  Peter Martinique, MD, [facility] 12/27/2016, 8:31 AM

## 2016-12-27 ENCOUNTER — Ambulatory Visit (INDEPENDENT_AMBULATORY_CARE_PROVIDER_SITE_OTHER): Payer: 59 | Admitting: Cardiology

## 2016-12-27 ENCOUNTER — Encounter: Payer: Self-pay | Admitting: Cardiology

## 2016-12-27 VITALS — BP 131/85 | HR 84 | Ht 67.0 in | Wt 165.0 lb

## 2016-12-27 DIAGNOSIS — E782 Mixed hyperlipidemia: Secondary | ICD-10-CM | POA: Diagnosis not present

## 2016-12-27 DIAGNOSIS — I10 Essential (primary) hypertension: Secondary | ICD-10-CM | POA: Diagnosis not present

## 2016-12-27 DIAGNOSIS — I25118 Atherosclerotic heart disease of native coronary artery with other forms of angina pectoris: Secondary | ICD-10-CM

## 2016-12-27 DIAGNOSIS — R5383 Other fatigue: Secondary | ICD-10-CM

## 2016-12-27 DIAGNOSIS — E1122 Type 2 diabetes mellitus with diabetic chronic kidney disease: Secondary | ICD-10-CM

## 2016-12-27 DIAGNOSIS — N183 Chronic kidney disease, stage 3 unspecified: Secondary | ICD-10-CM

## 2016-12-27 DIAGNOSIS — N182 Chronic kidney disease, stage 2 (mild): Secondary | ICD-10-CM

## 2016-12-27 MED ORDER — ISOSORBIDE MONONITRATE ER 60 MG PO TB24: 60.0000 mg | ORAL_TABLET | Freq: Every day | ORAL | 3 refills | Status: DC

## 2016-12-27 NOTE — Patient Instructions (Signed)
We will increase Imdur to 60 mg daily  Continue your other therapy  We will update lab work today  I will see you in 3 months.

## 2016-12-30 ENCOUNTER — Encounter: Payer: Self-pay | Admitting: Physician Assistant

## 2016-12-30 DIAGNOSIS — E1322 Other specified diabetes mellitus with diabetic chronic kidney disease: Secondary | ICD-10-CM

## 2016-12-30 DIAGNOSIS — N183 Chronic kidney disease, stage 3 (moderate): Principal | ICD-10-CM

## 2016-12-30 DIAGNOSIS — IMO0002 Reserved for concepts with insufficient information to code with codable children: Secondary | ICD-10-CM | POA: Insufficient documentation

## 2016-12-30 DIAGNOSIS — E1365 Other specified diabetes mellitus with hyperglycemia: Principal | ICD-10-CM

## 2016-12-30 MED ORDER — INSULIN PEN NEEDLE 32G X 4 MM MISC: 4 refills | Status: DC

## 2016-12-30 MED ORDER — GLUCOSE BLOOD VI STRP: ORAL_STRIP | 12 refills | Status: DC

## 2017-01-02 ENCOUNTER — Telehealth: Payer: Self-pay | Admitting: Cardiology

## 2017-01-02 DIAGNOSIS — E782 Mixed hyperlipidemia: Secondary | ICD-10-CM

## 2017-01-02 DIAGNOSIS — I10 Essential (primary) hypertension: Secondary | ICD-10-CM

## 2017-01-02 DIAGNOSIS — I214 Non-ST elevation (NSTEMI) myocardial infarction: Secondary | ICD-10-CM

## 2017-01-02 DIAGNOSIS — N183 Chronic kidney disease, stage 3 unspecified: Secondary | ICD-10-CM

## 2017-01-02 DIAGNOSIS — R5383 Other fatigue: Secondary | ICD-10-CM

## 2017-01-02 DIAGNOSIS — I25118 Atherosclerotic heart disease of native coronary artery with other forms of angina pectoris: Secondary | ICD-10-CM

## 2017-01-02 LAB — HEPATIC FUNCTION PANEL
ALBUMIN: 2.3 g/dL — AB (ref 3.6–5.1)
ALT: 34 U/L — AB (ref 6–29)
AST: 38 U/L — ABNORMAL HIGH (ref 10–35)
Alkaline Phosphatase: 103 U/L (ref 33–130)
BILIRUBIN INDIRECT: 0.3 mg/dL (ref 0.2–1.2)
Bilirubin, Direct: 0.1 mg/dL (ref <=0.2)
TOTAL PROTEIN: 4.6 g/dL — AB (ref 6.1–8.1)
Total Bilirubin: 0.4 mg/dL (ref 0.2–1.2)

## 2017-01-02 LAB — BASIC METABOLIC PANEL
BUN: 11 mg/dL (ref 7–25)
CHLORIDE: 105 mmol/L (ref 98–110)
CO2: 27 mmol/L (ref 20–31)
Calcium: 7.7 mg/dL — ABNORMAL LOW (ref 8.6–10.4)
Creat: 0.76 mg/dL (ref 0.50–0.99)
GLUCOSE: 101 mg/dL — AB (ref 65–99)
POTASSIUM: 3.7 mmol/L (ref 3.5–5.3)
SODIUM: 139 mmol/L (ref 135–146)

## 2017-01-02 LAB — LIPID PANEL
CHOL/HDL RATIO: 1.8 ratio (ref <5.0)
CHOLESTEROL: 62 mg/dL (ref <200)
HDL: 35 mg/dL — ABNORMAL LOW (ref >50)
LDL CALC: 12 mg/dL (ref <100)
TRIGLYCERIDES: 75 mg/dL (ref <150)
VLDL: 15 mg/dL (ref <30)

## 2017-01-02 NOTE — Telephone Encounter (Signed)
Mr.Tokarczyk is calling because she is needing to go to the lab and does not have the lab order and is needing another one . [facility] told her to call the office , because they do not have an order . Please call

## 2017-01-02 NOTE — Telephone Encounter (Signed)
Spoke to patient . She states she found the lab slip and going to lab.

## 2017-01-03 ENCOUNTER — Other Ambulatory Visit: Payer: Self-pay

## 2017-01-03 LAB — HEMOGLOBIN A1C
HEMOGLOBIN A1C: 7.4 % — AB (ref <5.7)
Mean Plasma Glucose: 166 mg/dL

## 2017-01-06 ENCOUNTER — Ambulatory Visit (INDEPENDENT_AMBULATORY_CARE_PROVIDER_SITE_OTHER): Payer: 59 | Admitting: Internal Medicine

## 2017-01-06 ENCOUNTER — Encounter: Payer: Self-pay | Admitting: Internal Medicine

## 2017-01-06 VITALS — BP 156/88 | HR 90 | Temp 98.2°F | Resp 16 | Ht 67.0 in

## 2017-01-06 DIAGNOSIS — J069 Acute upper respiratory infection, unspecified: Secondary | ICD-10-CM | POA: Diagnosis not present

## 2017-01-06 MED ORDER — AZITHROMYCIN 250 MG PO TABS: ORAL_TABLET | ORAL | 0 refills | Status: DC

## 2017-01-06 MED ORDER — DEXAMETHASONE SODIUM PHOSPHATE 10 MG/ML IJ SOLN
10.0000 mg | Freq: Once | INTRAMUSCULAR | Status: AC
  Administered 2017-01-06: 10 mg via INTRAMUSCULAR

## 2017-01-06 MED ORDER — PROMETHAZINE-DM 6.25-15 MG/5ML PO SYRP: ORAL_SOLUTION | ORAL | 1 refills | Status: DC

## 2017-01-06 NOTE — Progress Notes (Signed)
HPI  Patient presents to the office for evaluation of cough.  It has been going on for 2 days.  Patient reports night > day, wet, worse with lying down, yellow green sputum production.  She is blowing similar stuff out of her nose.  They also endorse change in voice, postnasal drip, shortness of breath, wheezing and nasal congestion, rhinorrhea, ear pain, sore throat.  .  They have tried none.  They report that nothing has worked.  They admits to other sick contacts.  Her husband has very similar symptoms.    She also reports that she has been off of her lasix now for the last couple weeks secondary to hypokalemia.  She is reports that since she was seen by Dr. Martinique on 12/27/16 she has gained 15 lbs.  She is having significant leg swelling.  No PND or orthopnea.    Review of Systems  Constitutional: Positive for malaise/fatigue. Negative for chills, diaphoresis and fever.  HENT: Positive for congestion. Negative for ear pain, hearing loss and sore throat.   Respiratory: Positive for cough. Negative for shortness of breath and wheezing.   Cardiovascular: Positive for leg swelling. Negative for chest pain, palpitations, orthopnea and PND.  Gastrointestinal: Negative.   Genitourinary: Negative.   Neurological: Negative for dizziness, sensory change and loss of consciousness.  Psychiatric/Behavioral: Negative for depression. The patient is not nervous/anxious and does not have insomnia.     PE:  Vitals:   01/06/17 1135  BP: (!) 156/88  Pulse: 90  Resp: 16  Temp: 98.2 F (36.8 C)   Wt Readings from Last 3 Encounters:  12/27/16 165 lb (74.8 kg)  12/13/16 165 lb 9.6 oz (75.1 kg)  12/11/16 161 lb 12.8 oz (73.4 kg)    General:  Alert and non-toxic, WDWN, NAD HEENT: NCAT, PERLA, EOM normal, no occular discharge or erythema.  Nasal mucosal edema with sinus tenderness to palpation.  Oropharynx clear with minimal oropharyngeal edema and erythema.  Mucous membranes moist and pink. Neck:   Cervical adenopathy Chest:  RRR no MRGs.  Lungs clear to auscultation A&P with no wheezes rhonchi or rales.   Abdomen: +BS x 4 quadrants, soft, non-tender, no guarding, rigidity, or rebound. Skin: warm and dry no rash Neuro: A&Ox4, CN II-XII grossly intact  Assessment and Plan:   1. Acute URI -restart astelin 1 spray per nostril BID -cont zyrtec -cont flonase - dexamethasone (DECADRON) injection 10 mg; Inject 1 mL (10 mg total) into the muscle once. - azithromycin (ZITHROMAX Z-PAK) 250 MG tablet; 2 po day one, then 1 daily x 4 days  Dispense: 6 tablet; Refill: 0 - promethazine-dextromethorphan (PROMETHAZINE-DM) 6.25-15 MG/5ML syrup; Take 5-10 ML PO q8hrs prn for coughing.  Dispense: 180 mL; Refill: 1  2.  Peripheral edema in the setting of CAD -will send a message to Dr. Martinique about restarting lasix due to weight gain and peripheral edema

## 2017-01-08 ENCOUNTER — Telehealth: Payer: Self-pay

## 2017-01-08 DIAGNOSIS — N182 Chronic kidney disease, stage 2 (mild): Secondary | ICD-10-CM

## 2017-01-08 DIAGNOSIS — E1122 Type 2 diabetes mellitus with diabetic chronic kidney disease: Secondary | ICD-10-CM

## 2017-01-08 DIAGNOSIS — I2510 Atherosclerotic heart disease of native coronary artery without angina pectoris: Secondary | ICD-10-CM

## 2017-01-08 DIAGNOSIS — I10 Essential (primary) hypertension: Secondary | ICD-10-CM

## 2017-01-08 NOTE — Telephone Encounter (Signed)
Spoke to patient Dr.Jordan advised repeat hepatic panel in 6 months.Stated she would like a1c and cholesterol checked too.Advised I will mail lab orders.

## 2017-01-10 ENCOUNTER — Other Ambulatory Visit: Payer: 59

## 2017-01-10 ENCOUNTER — Other Ambulatory Visit: Payer: Self-pay | Admitting: Internal Medicine

## 2017-01-10 DIAGNOSIS — G629 Polyneuropathy, unspecified: Secondary | ICD-10-CM | POA: Diagnosis not present

## 2017-01-10 DIAGNOSIS — I10 Essential (primary) hypertension: Secondary | ICD-10-CM

## 2017-01-10 DIAGNOSIS — Z79899 Other long term (current) drug therapy: Secondary | ICD-10-CM

## 2017-01-10 DIAGNOSIS — R432 Parageusia: Secondary | ICD-10-CM | POA: Diagnosis not present

## 2017-01-10 LAB — MAGNESIUM: Magnesium: 1.8 mg/dL (ref 1.5–2.5)

## 2017-01-10 LAB — BASIC METABOLIC PANEL WITH GFR
BUN: 10 mg/dL (ref 7–25)
CO2: 21 mmol/L (ref 20–31)
Calcium: 8.2 mg/dL — ABNORMAL LOW (ref 8.6–10.4)
Chloride: 104 mmol/L (ref 98–110)
Creat: 1.2 mg/dL — ABNORMAL HIGH (ref 0.50–0.99)
GFR, EST AFRICAN AMERICAN: 56 mL/min — AB (ref >=60)
GFR, Est Non African American: 49 mL/min — ABNORMAL LOW (ref >=60)
GLUCOSE: 230 mg/dL — AB (ref 65–99)
POTASSIUM: 3.6 mmol/L (ref 3.5–5.3)
Sodium: 141 mmol/L (ref 135–146)

## 2017-01-10 LAB — HEPATIC FUNCTION PANEL
ALBUMIN: 2.5 g/dL — AB (ref 3.6–5.1)
ALK PHOS: 138 U/L — AB (ref 33–130)
ALT: 70 U/L — AB (ref 6–29)
AST: 96 U/L — ABNORMAL HIGH (ref 10–35)
BILIRUBIN INDIRECT: 0.3 mg/dL (ref 0.2–1.2)
Bilirubin, Direct: 0.1 mg/dL (ref <=0.2)
Total Bilirubin: 0.4 mg/dL (ref 0.2–1.2)
Total Protein: 4.6 g/dL — ABNORMAL LOW (ref 6.1–8.1)

## 2017-01-10 LAB — CALCIUM, IONIZED: Calcium, Ion: 4.8 mg/dL (ref 4.8–5.6)

## 2017-01-23 ENCOUNTER — Ambulatory Visit: Payer: Self-pay | Admitting: Physician Assistant

## 2017-01-23 ENCOUNTER — Encounter: Payer: Self-pay | Admitting: Physician Assistant

## 2017-01-23 ENCOUNTER — Ambulatory Visit (INDEPENDENT_AMBULATORY_CARE_PROVIDER_SITE_OTHER): Payer: 59 | Admitting: Physician Assistant

## 2017-01-23 ENCOUNTER — Telehealth: Payer: Self-pay

## 2017-01-23 VITALS — BP 136/72 | HR 78 | Temp 97.3°F | Resp 14 | Ht 67.0 in | Wt 161.2 lb

## 2017-01-23 DIAGNOSIS — G629 Polyneuropathy, unspecified: Secondary | ICD-10-CM

## 2017-01-23 DIAGNOSIS — R432 Parageusia: Secondary | ICD-10-CM | POA: Diagnosis not present

## 2017-01-23 MED ORDER — NYSTATIN 100000 UNIT/ML MT SUSP: OROMUCOSAL | 0 refills | Status: DC

## 2017-01-23 NOTE — Telephone Encounter (Signed)
Nystatin called into pharmacy @ 4:42pm on 8th March 2018 by DD.

## 2017-01-23 NOTE — Patient Instructions (Addendum)
Will check labs Increase amitriptyline to 50mg  at night, if this does not help your legs we may have to try lyrica or other medications.   Glossitis Glossitis is inflammation of the tongue. This may be a stand-alone condition or it may be a symptom of a different condition that you have. Generally, glossitis goes away when its cause is identified and treated. Glossitis can be dangerous if it causes difficulty breathing. What are the causes? This condition can be caused by many different things. In some cases, the cause may not be known. Certain underlying conditions may cause glossitis, such as:  Viral, bacterial, or yeast infections.  Allergies.  Dysfunction of the skin and mucous membranes. This can happen with certain autoimmune disorders.  Abnormal tissue growths (tumors).  Lack of healthy red blood cells (anemia).  Movement of stomach acid into the tube that connects the mouth and the stomach (gastroesophageal reflux).  Lack of proper nutrition or certain vitamins.  Certain lifelong (chronic) medical conditions, such as diabetes. Sometimes, glossitis may not be caused by an underlying condition. In these cases, glossitis may be caused by:  Use of tobacco products, such as cigarettes, chewing tobacco, or e-cigarettes.  Excessive alcohol use.  Tongue injury or irritation.  Certain medicines, such as medicines to treat cancer. What increases the risk? This condition is more likely to develop in:  People who are 50 years or older.  Men.  People who are taking antibiotics or steroids, such as asthma medicines.  People who drink alcohol excessively.  People who use tobacco products, including cigarettes, chewing tobacco, or e-cigarettes.  People with chronic medical conditions, such as immune diseases or cancer.  People who do not brush or floss their teeth regularly.  People who lack proper nutrition or are anemic. What are the signs or symptoms? Symptoms of this  condition vary depending on the cause. Symptoms may include:  Swelling of the tongue.  Pain and tenderness in the tongue. Sometimes, this condition is painless.  Changes in tongue color. The tongue may be pale or bright red.  Smooth areas on the tongue's surface.  A small mass of tissue (node) or white patch on the tongue.  Difficulty chewing, swallowing, or talking.  Difficulty breathing. How is this diagnosed? This condition is diagnosed based on a physical exam and medical history. Your health care provider may ask you about your eating and drinking habits. You may have tests, including:  Blood tests.  Removal of a small amount of cells from the tongue that are examined under a microscope (biopsy). You may be given the name of a dentist or a health care provider who specializes in ear, nose, and throat (ENT) problems (otolaryngologist). How is this treated? Treatment for this condition depends on the underlying cause and may include:  Following instructions from your health care provider about keeping your mouth clean and avoiding irritants that may have caused your condition or made it worse.  Nutritional therapy. Your health care provider may tell you to change your eating and drinking habits or take a nutritional supplement.  Managing underlying conditions that may have caused your glossitis.  Medicines, such as:  Corticosteroids to reduce inflammation.  Antibiotics if your condition was caused by an infection.  Local anestheticsthat numb your tongue or mouth (local anesthetics). Follow these instructions at home:  Keep your teeth and mouth clean. This includes brushing and flossing frequently and having regular dental checkups.  If you wear dentures or dental braces, work with your dentist  to make sure they fit correctly.  Eat healthy foods. Follow instructions from your health care provider about eating or drinking restrictions.  Avoid tobacco products,  including cigarettes, chewing tobacco, or e-cigarettes. If you need help quitting, ask your health care provider.  Avoid excessive alcohol use.  Avoid any irritants that may have caused your condition or made it worse, such as chemicals or certain foods.  Keep all follow-up visits as told by your health care provider. This is important. Contact a health care provider if:  You have a fever.  You develop new symptoms.  You have symptoms that do not get better with medicine or get worse.  You have symptoms that do not go away after 10 days.  You cannot eat or drink because of your pain. Get help right away if:  You have severe pain or swelling.  You have difficulty breathing, swallowing, or talking. This information is not intended to replace advice given to you by your health care provider. Make sure you discuss any questions you have with your health care provider. Document Released: 10/25/2002 Document Revised: 04/11/2016 Document Reviewed: 03/22/2015 Elsevier Interactive Patient Education  2017 [employer].

## 2017-01-23 NOTE — Progress Notes (Signed)
Subjective:    Patient ID: Courtney Stevens, female    DOB: Mar 28, 1956, 61 y.o.   MRN: [id number]  HPI 61 y.o. WF with history of DM2, NSTEMI, HTN, presents with leg pain and abnormal tast in her mouth.    Has bilateral leg pain "all the time", will also have numbness and tingling, and will have right back/hip pain occ, no pain down her legs, some difficulty with walking.   Also has had metallic tastet in her mouth x christma/MI  Blood pressure 136/72, pulse 78, temperature 97.3 F (36.3 C), resp. rate 14, height '5\' 7"'  (1.702 m), weight 161 lb 3.2 oz (73.1 kg), SpO2 98 %.  Medications Current Outpatient Prescriptions on File Prior to Visit  Medication Sig  . albuterol (VENTOLIN HFA) 108 (90 BASE) MCG/ACT inhaler Inhale 2 puffs into the lungs every 4 (four) hours as needed for wheezing or shortness of breath.  . ALPRAZolam (XANAX) 1 MG tablet Take 1 mg by mouth 3 (three) times daily as needed for anxiety.  Marland Kitchen amitriptyline (ELAVIL) 25 MG tablet Take 25 mg by mouth at bedtime.  Marland Kitchen antiseptic oral rinse (BIOTENE) LIQD 15 mLs by Mouth Rinse route as needed for dry mouth.  Marland Kitchen aspirin EC 81 MG EC tablet Take 1 tablet (81 mg total) by mouth daily.  Marland Kitchen atorvastatin (LIPITOR) 40 MG tablet Take 1/2 tablet ( 20 mg ) daily  . azithromycin (ZITHROMAX Z-PAK) 250 MG tablet 2 po day one, then 1 daily x 4 days  . Blood Glucose Monitoring Suppl (ONE TOUCH ULTRA SYSTEM KIT) w/Device KIT 1 kit by Does not apply route once.  . calcium citrate-vitamin D (CITRACAL+D) 315-200 MG-UNIT per tablet Take 1 tablet by mouth 3 (three) times daily.  . cetirizine (ZYRTEC) 10 MG tablet Take 10 mg by mouth every morning.    . ergocalciferol (VITAMIN D2) 50000 units capsule Take 50,000 Units by mouth daily. DOES NOT TAKE ON SUNDAYS  . fluticasone (FLONASE) 50 MCG/ACT nasal spray PLACE 2 SPRAYS INTO BOTH NOSTRILS DAILY.  Marland Kitchen glipiZIDE (GLUCOTROL) 5 MG tablet Take 5 mg by mouth daily before breakfast.  . glucose blood (FREESTYLE  LITE) test strip Test sugar 2-3 times daily for fluctuating blood sugars  . insulin glargine (LANTUS) 100 UNIT/ML injection Inject 3 Units into the skin 3 (three) times daily.  . Insulin Pen Needle 32G X 4 MM MISC Inject insulin once daily SQ  . isosorbide mononitrate (IMDUR) 60 MG 24 hr tablet Take 1 tablet (60 mg total) by mouth daily.  . Lancets (ONETOUCH ULTRASOFT) lancets Check blood sugar 3 times daily due to poorly controlled glucose-DX-E11.65.  Marland Kitchen levothyroxine (SYNTHROID, LEVOTHROID) 200 MCG tablet Take 200 mcg by mouth daily before breakfast. PT ALTERNATES 1/2 TABLET QOD WITH 1 TABLET QOD  . lipase/protease/amylase (CREON) 12000 units CPEP capsule TAKE 1 CAPSULE BY MOUTH 3 TIMES DAILY.  . metFORMIN (GLUCOPHAGE) 500 MG tablet TAKE 1-2 TABLETS BY MOUTH 3 TIMES DAILY. TAKE 1 TAB AT BREAKFAST, 1 AT LUNCH AND 2 AT DINNER (Patient taking differently: Take 500-1,000 mg by mouth See admin instructions. TAKE 1-2 TABLETS BY MOUTH 3 TIMES DAILY. TAKE 1 TAB AT BREAKFAST, 1 AT LUNCH AND 2 AT [facility])  . Multiple Vitamins-Minerals (MULTIVITAMIN WITH MINERALS) tablet Take 1 tablet by mouth daily.   . nitroGLYCERIN (NITROSTAT) 0.4 MG SL tablet Place 1 tablet (0.4 mg total) under the tongue every 5 (five) minutes as needed for chest pain. Up to 3 doses  . OVER THE COUNTER  MEDICATION Take 1 tablet by mouth daily. Calcium,magnesium and zinc in one.  . pantoprazole (PROTONIX) 40 MG tablet Take 40 mg by mouth every morning.  . promethazine-dextromethorphan (PROMETHAZINE-DM) 6.25-15 MG/5ML syrup Take 5-10 ML PO q8hrs prn for coughing.  . sucralfate (CARAFATE) 1 g tablet TAKE 1 TABLET BY MOUTH 4 TIMES DAILY.   No current facility-administered medications on file prior to visit.     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[place]); Type II diabetes mellitus with renal manifestations ; PANCREATIC INSUFFICIENCY; Personal history of colonic polyps; Multiple pulmonary nodules; Essential  hypertension; Hyperlipidemia; Intrinsic asthma; History of Roux-en-Y gastric bypass; Hypothyroidism; Hepatic necrotizing granuloma; Vitamin D deficiency; Medication management; GERD; BMI 25.0-25.9,adult; Chest pain with moderate risk for cardiac etiology; NSTEMI (non-ST elevated myocardial infarction) ([place]); Hypokalemia; CKD (chronic kidney disease), stage III; CAD in native artery; PVC's (premature ventricular contractions); and Uncontrolled secondary diabetes mellitus with stage 3 CKD (GFR 30-59) (HCC) on her problem list.    Review of Systems  Constitutional: Positive for fatigue. Negative for chills and fever.  Respiratory: Negative for cough, chest tightness, shortness of breath and wheezing.   Cardiovascular: Positive for chest pain (had 1 episode last several mins, went away, did not take NTG). Negative for palpitations and leg swelling.  Gastrointestinal: Negative for abdominal pain, constipation, diarrhea, nausea and vomiting.  Genitourinary: Negative.   Musculoskeletal: Positive for gait problem and myalgias. Negative for back pain.  Neurological: Positive for numbness. Negative for dizziness, tremors, facial asymmetry, weakness, light-headedness and headaches.  Psychiatric/Behavioral: The patient is nervous/anxious.        Objective:   Physical Exam  Constitutional: She is oriented to person, place, and time. She appears well-developed and well-nourished. No distress.  HENT:  Head: Normocephalic.  Mouth/Throat: Oropharynx is clear and moist. No oropharyngeal exudate.  Slight depapillation of left tongue, smooth, slightly erythematous but no obvious oral lesions   Eyes: Conjunctivae are normal. No scleral icterus.  Neck: Normal range of motion. Neck supple. No JVD present. No thyromegaly present.  Cardiovascular: Normal rate, regular rhythm, normal heart sounds and intact distal pulses.  Exam reveals no gallop and no friction rub.   No murmur heard. Pulmonary/Chest: Effort  normal and breath sounds normal. No respiratory distress. She has no wheezes. She has no rales. She exhibits no tenderness.  Abdominal: Soft. Bowel sounds are normal. She exhibits no distension and no mass. There is no tenderness. There is no rebound and no guarding.  Musculoskeletal: Normal range of motion.  Lymphadenopathy:    She has no cervical adenopathy.  Neurological: She is alert and oriented to person, place, and time. She has normal strength. She displays no atrophy and no tremor. A sensory deficit (bilateral legs to below knee with decreased sensation, normal pulses) is present. No cranial nerve deficit. She exhibits abnormal muscle tone. Gait (slap foot walking) abnormal. Coordination normal.  Skin: Skin is warm and dry. No abrasion, no lesion and no rash noted. She is not diaphoretic.  Psychiatric: She has a normal mood and affect. Her behavior is normal. Judgment and thought content normal.  Nursing note and vitals reviewed.       Assessment & Plan:  1. 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  - Zinc - Heavy Metals Panel, Blood - Iron - Folate  2. Abnormal taste in mouth ? Yeast, nystatin sent in, ? From imdur, will rule out other causes  but may have to switch medications.  - Zinc - Heavy Metals Panel, Blood - Iron - Folate  Future Appointments Date Time Provider [place]  03/03/2017 10:00 AM Unk Pinto, MD [facility] None  03/19/2017 10:00 AM Peter M Martinique, MD [facility]  04/15/2017 3:15 PM Max Villa Herb, DPM [facility]

## 2017-01-24 ENCOUNTER — Other Ambulatory Visit: Payer: Self-pay | Admitting: Internal Medicine

## 2017-01-24 LAB — IRON: Iron: 58 ug/dL (ref 45–160)

## 2017-01-24 LAB — FOLATE: Folate: 15.8 ng/mL (ref >5.4)

## 2017-01-26 LAB — HEAVY METALS PANEL, BLOOD: Mercury, B: <4 mcg/L (ref <=10)

## 2017-01-28 ENCOUNTER — Encounter: Payer: Self-pay | Admitting: Physician Assistant

## 2017-01-28 LAB — ZINC: ZINC: 98 ug/dL (ref 60–130)

## 2017-01-29 ENCOUNTER — Other Ambulatory Visit: Payer: Self-pay | Admitting: Internal Medicine

## 2017-01-29 ENCOUNTER — Encounter: Payer: Self-pay | Admitting: Physician Assistant

## 2017-02-03 ENCOUNTER — Encounter: Payer: Self-pay | Admitting: Physician Assistant

## 2017-02-05 ENCOUNTER — Encounter: Payer: Self-pay | Admitting: Physician Assistant

## 2017-02-05 MED ORDER — INSULIN PEN NEEDLE 32G X 4 MM MISC: 4 refills | Status: AC

## 2017-02-07 ENCOUNTER — Ambulatory Visit: Payer: Managed Care, Other (non HMO) | Admitting: Cardiology

## 2017-02-10 ENCOUNTER — Other Ambulatory Visit: Payer: Self-pay | Admitting: Internal Medicine

## 2017-02-10 DIAGNOSIS — E1121 Type 2 diabetes mellitus with diabetic nephropathy: Secondary | ICD-10-CM

## 2017-02-10 DIAGNOSIS — K8689 Other specified diseases of pancreas: Secondary | ICD-10-CM

## 2017-02-10 DIAGNOSIS — Z79899 Other long term (current) drug therapy: Secondary | ICD-10-CM

## 2017-02-21 ENCOUNTER — Other Ambulatory Visit: Payer: Self-pay | Admitting: Physician Assistant

## 2017-02-24 ENCOUNTER — Encounter: Payer: Self-pay | Admitting: Cardiology

## 2017-02-24 ENCOUNTER — Encounter: Payer: Self-pay | Admitting: Internal Medicine

## 2017-02-24 ENCOUNTER — Encounter: Payer: Self-pay | Admitting: Physician Assistant

## 2017-02-24 ENCOUNTER — Encounter: Payer: Self-pay | Admitting: Nurse Practitioner

## 2017-02-27 ENCOUNTER — Encounter: Payer: Self-pay | Admitting: Internal Medicine

## 2017-02-27 ENCOUNTER — Ambulatory Visit (INDEPENDENT_AMBULATORY_CARE_PROVIDER_SITE_OTHER): Payer: 59 | Admitting: Internal Medicine

## 2017-02-27 VITALS — BP 114/62 | HR 74 | Temp 98.2°F | Resp 14 | Ht 67.0 in

## 2017-02-27 DIAGNOSIS — R300 Dysuria: Secondary | ICD-10-CM | POA: Diagnosis not present

## 2017-02-27 MED ORDER — CYCLOBENZAPRINE HCL 10 MG PO TABS: 10.0000 mg | ORAL_TABLET | Freq: Every day | ORAL | 0 refills | Status: DC

## 2017-02-27 MED ORDER — AMOXICILLIN-POT CLAVULANATE 875-125 MG PO TABS: 1.0000 | ORAL_TABLET | Freq: Two times a day (BID) | ORAL | 0 refills | Status: DC

## 2017-02-27 NOTE — Progress Notes (Signed)
Assessment and Plan:  Patient with LLQ abdominal pain.  There is also some dysuria.  Possible UTI vs. Diverticulitis.  We will try augmentin which has coverage for both diverticulitis and UTI.  Will chang abx if needed based on culture if positive.  Patient to call office if blood stools, worsening abdominal pain, severe diarrhea, or any other concerning symptoms.  Patient will drink plenty of water and will also try taking vitamin C 3 times daily.   1. Dysuria  - Urinalysis, Routine w reflex microscopic - Urine culture    HPI 61 y.o.female presents for evaluation of body aches, chills, myalgias and glands being tender and sore.  She reports that she has low back pain and her kidney pain.  She reports that she is going to have a liver biopsy on Wednesday. She reports that her sugar was mildly up when they last did labs.  She reports that she does sometimes have some pain when she urinates.  She is also having yellow diarrhea.  She reports that the diarrhea started prior to the urinary issues.  She reports that she has stomach pressure.  She reports that she feels like she can't walk straight.  She is stumbling and walking strange still. She feels like this is getting worse.   Past Medical History:  Diagnosis Date  . Anemia    History of blood transfusion which is when pt reports being exposed to hepatitis in 2009.  . Anxiety   . Asthma   . CAD (coronary artery disease)    a. 03/2016 St [facility]: nl EF, no wma-->Nl study;  b. 11/13/16 NSTEMI/Cath: EF 55-65%, 40% prox RCA, 20% prox-mid LAD, 90% D2 lesion. The culprit for the NSTEMI appeared to be the 90% ostial stenosis in second diagonal which is a 2 mm in diameter and supplies a relatively small territory. Managed medically.  . Cancer (HCC)    K[place]kin bile duct ca (2009)   . CKD (chronic kidney disease), stage III   . Diabetes mellitus   . GERD (gastroesophageal reflux disease)   . H/O gastric bypass   . Hepatitis   . Hyperlipidemia   .  Hypertension   . Hypothyroid   . PVC's (premature ventricular contractions)      Allergies  Allergen Reactions  . Ace Inhibitors Anaphylaxis  . Tilade [Nedocromil] Anaphylaxis  . Reglan [Metoclopramide] Nausea And Vomiting  . Betadine [Povidone Iodine] Rash    iching       Current Outpatient Prescriptions on File Prior to Visit  Medication Sig Dispense Refill  . albuterol (VENTOLIN HFA) 108 (90 BASE) MCG/ACT inhaler Inhale 2 puffs into the lungs every 4 (four) hours as needed for wheezing or shortness of breath. 1 Inhaler 4  . ALPRAZolam (XANAX) 1 MG tablet Take 1 mg by mouth 3 (three) times daily as needed for anxiety.    . antiseptic oral rinse (BIOTENE) LIQD 15 mLs by Mouth Rinse route as needed for dry mouth.    . aspirin EC 81 MG EC tablet Take 1 tablet (81 mg total) by mouth daily. 90 tablet 3  . atorvastatin (LIPITOR) 40 MG tablet Take 1/2 tablet ( 20 mg ) daily 90 tablet 3  . Blood Glucose Monitoring Suppl (ONE TOUCH ULTRA SYSTEM KIT) w/Device KIT 1 kit by Does not apply route once. 1 each 0  . calcium citrate-vitamin D (CITRACAL+D) 315-200 MG-UNIT per tablet Take 1 tablet by mouth 3 (three) times daily.    . cetirizine (ZYRTEC) 10 MG tablet   Take 10 mg by mouth every morning.      Marland Kitchen CREON 12000 units CPEP capsule TAKE 1 CAPSULE THREE TIMES A DAY 270 capsule 1  . ergocalciferol (VITAMIN D2) 50000 units capsule Take 50,000 Units by mouth daily. DOES NOT TAKE ON SUNDAYS    . fluticasone (FLONASE) 50 MCG/ACT nasal spray PLACE 2 SPRAYS INTO BOTH NOSTRILS DAILY. 16 g 1  . FREESTYLE LITE test strip USE TO CHECK BLOOD SUGAR 3 TIMES A DAY DUE TO POORLY CONTROLLED DIABETES. DX E11.65 100 each 3  . furosemide (LASIX) 40 MG tablet TAKE 1 TO 2 TABLETS TWICE A DAY FOR FLUID AND SWELLING 360 tablet 1  . glipiZIDE (GLUCOTROL) 5 MG tablet TAKE ONE-HALF (1/2) TO ONE TABLET THREE TIMES A DAY IF NEEDED FOR ELEVATED GLUCOSE 270 tablet 1  . insulin glargine (LANTUS) 100 UNIT/ML injection Inject 3  Units into the skin 3 (three) times daily.    . Insulin Pen Needle 32G X 4 MM MISC Inject insulin 1-3 times daily SQ 100 each 4  . isosorbide mononitrate (IMDUR) 60 MG 24 hr tablet Take 1 tablet (60 mg total) by mouth daily. 90 tablet 3  . Lancets (ONETOUCH ULTRASOFT) lancets Check blood sugar 3 times daily due to poorly controlled glucose-DX-E11.65. 100 each 12  . levothyroxine (SYNTHROID, LEVOTHROID) 200 MCG tablet TAKE ONE-HALF (1/2) TO ONE TABLET DAILY BEFORE BREAKFAST ALTERNATE TAKING 100 MCG AND 200 MCG 90 tablet 1  . metFORMIN (GLUCOPHAGE) 500 MG tablet TAKE 1 TO 2 TABLETS THREE TIMES A DAY. 1 TABLET BREAKFAST, 1 AT LUNCH AND 2 AT DINNER. 360 tablet 1  . metoprolol tartrate (LOPRESSOR) 25 MG tablet     . Multiple Vitamins-Minerals (MULTIVITAMIN WITH MINERALS) tablet Take 1 tablet by mouth daily.     . nitroGLYCERIN (NITROSTAT) 0.4 MG SL tablet Place 1 tablet (0.4 mg total) under the tongue every 5 (five) minutes as needed for chest pain. Up to 3 doses 25 tablet 3  . OVER THE COUNTER MEDICATION Take 1 tablet by mouth daily. Calcium,magnesium and zinc in one.    . pantoprazole (PROTONIX) 40 MG tablet TAKE 1 TABLET DAILY 90 tablet 1  . sucralfate (CARAFATE) 1 g tablet TAKE 1 TABLET FOUR TIMES A DAY 360 tablet 1   No current facility-administered medications on file prior to visit.     ROS: all negative except above.   Physical Exam: There were no vitals filed for this visit. BP 114/62   Pulse 74   Temp 98.2 F (36.8 C) (Temporal)   Resp 14   Ht 5' 7" (1.702 m)  General Appearance: Well developed well nourished, non-toxic appearing in no apparent distress. Eyes: PERRLA, EOMs, conjunctiva w/ no swelling or erythema or discharge Sinuses: No Frontal/maxillary tenderness ENT/Mouth: Ear canals clear without swelling or erythema.  TM's normal bilaterally with no retractions, bulging, or loss of landmarks.   Neck: Supple, thyroid normal, no notable JVD  Respiratory: Respiratory effort  normal, Clear breath sounds anteriorly and posteriorly bilaterally without rales, rhonchi, wheezing or stridor. No retractions or accessory muscle usage. Cardio: RRR with no MRGs.   Abdomen: Soft, + BS.  Mild left sided lower quadrant tenderness without guarding, rebound, hernias, masses.  Musculoskeletal: Full ROM, 5/5 strength, normal gait.  Skin: Warm, dry without rashes  Neuro: Awake and oriented X 3, Cranial nerves intact. Normal muscle tone, no cerebellar symptoms. Sensation intact.  Psych: normal affect, Insight and Judgment appropriate.     Starlyn Skeans, PA-C 4:36 PM [employer]  [facility]  

## 2017-02-28 ENCOUNTER — Encounter: Payer: Self-pay | Admitting: Cardiology

## 2017-02-28 LAB — URINALYSIS, ROUTINE W REFLEX MICROSCOPIC
BILIRUBIN URINE: NEGATIVE
HGB URINE DIPSTICK: NEGATIVE
Ketones, ur: NEGATIVE
Leukocytes, UA: NEGATIVE
Nitrite: NEGATIVE
Protein, ur: NEGATIVE
SPECIFIC GRAVITY, URINE: 1.009 (ref 1.001–1.035)
pH: 6.5 (ref 5.0–8.0)

## 2017-03-01 LAB — URINE CULTURE: Organism ID, Bacteria: NO GROWTH

## 2017-03-02 NOTE — Progress Notes (Signed)
[facility] Unk Pinto, M.D.    Uvaldo Bristle. Silverio Lay, P.A.-C      Starlyn Skeans, P.A.-C  [facility]                [address] [place], [place] [address] Telephone [phone] Telefax [phone]  Annual Screening/Preventative Visit & Comprehensive Evaluation &  Examination     This very nice 61 y.o. MWF presents for a Screening/Preventative Visit & comprehensive evaluation and management of multiple medical co-morbidities.  Patient has been followed for HTN, T2_NIDDM/CKD3, Hyperlipidemia and Vitamin D Deficiency.     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       HTN predates since 1989. Patient presented with her 1st MI in Dec 2017. Patient's BP has been controlled at home and patient denies any cardiac symptoms as chest pain, palpitations, shortness of breath, dizziness or ankle swelling. Today's BP is at goal - 104/70.      In Jan 2018, she presented with c/o gait difficulty - stumbling, (+) Rhomberg, ? horiz nystagmus, difficulties with speech, spelling dysnomia, and on 1 Feb had a Brain MRI which revealed no acute abnormalities, but did show mild chronic microvascular ischemic changes with parenchymal volume loss. Patient symptoms have persisted intermittently to present.     Patient's hyperlipidemia is controlled with diet and medications. Patient denies myalgias or other medication SE's. Last lipids were at goal:  Lab Results  Component Value Date   CHOL 62 01/02/2017   HDL 35 (L) 01/02/2017   LDLCALC 12 01/02/2017   TRIG 75 01/02/2017   CHOLHDL 1.8  01/02/2017      Patient has T2_NIDDM (2000) with CKD 3 (GFR 49 ml/min) and patient denies reactive hypoglycemic symptoms, visual blurring, diabetic polys, or paresthesias. She's using Lantus 6 units daily, Metformin 4 tabs daily and supplements with Glipizide 1-2 x/week for elevated glucoses.  Last A1c was not at goal: Lab Results  Component Value Date   HGBA1C 7.4 (H) 01/02/2017      Finally, patient has history of Vitamin D Deficiency ("28" in 2008)  and last Vitamin D was elevated and dose accordingly tapered: Lab Results  Component Value Date   VD25OH 119 (H) 10/23/2016   Current Outpatient Prescriptions on File Prior to Visit  Medication Sig  . Albuterol  HFA inhaler 2 puffs  every 4 hrs as needed  . ALPRAZolam 1 MG Take  3 x daily  . BIOTENE LIQD 15 mLs by Mouth Rinse route as needed  . aspirin EC 81 MG EC  Take 1 tablet (81 mg total) by mouth daily.  . Atorvastatin 40 MG  Take 1/2 tablet ( 20 mg ) daily  . CITRACAL+D  Take 1 tablet by mouth 3 (three) times daily.  . cetirizine  10 MG  Take 10 mg by mouth every morning.    Marland Kitchen CREON 12000 units CPEP  TAKE 1 CAPSULE THREE TIMES A DAY  . cyclobenzaprine  10 MG  Take 1 tablet (10 mg total) by mouth at bedtime.  Marland Kitchen  VITAMIN D 50,000  Take 50,000 Units by mouth daily  . FLONASE  nasal spray 2 SPRAYS INTO  NOSTRILS DAILY.  . furosemide  40 MG  TAKE 1-2 TABTWICE A DAY   . glipiZIDE  5 MG  TAKE 1/2-1 TAB THREE TIMES A DAY  . LANTUS 100 UNIT/ML i Inject 3 Units into the skin 3 (three) times daily.  . IMDUR 60 MG 24 hr Take 1 tabl daily.  Marland Kitchen levothyroxine  200 MCG TAKE 1/2-1 TABLET DAILY   . metFORMIN  500 MG  TAKE 1 TO 2 TABLETS THREE TIMES A DAY  . metoprolol tartrate 25 MG    . Multiple Vitamins-Minerals  Take 1 tablet by mouth daily.   Marland Kitchen NITROSTAT 0.4 MG SL  as needed for chest pain  . Calcium,magnesium and zinc  Take 1 tablet by mouth daily.   . pantoprazole 40 MG  TAKE 1 TABLET DAILY  . sucralfate 1 g  TAKE 1 TABLET FOUR TIMES A DAY    Allergies  Allergen Reactions  . Ace Inhibitors Anaphylaxis  . Tilade [Nedocromil] Anaphylaxis  . Reglan [Metoclopramide] Nausea And Vomiting  . Betadine [Povidone Iodine] Rash    iching    Past Medical History:  Diagnosis Date  . Anemia    History of blood transfusion which is when pt reports being exposed to hepatitis in 2009.  Marland Kitchen Anxiety   . Asthma   . CAD (coronary artery disease)    a. 03/2016 St Echo: nl EF, no wma-->Nl study;  b. 11/13/16 NSTEMI/Cath: EF 55-65%, 40% prox RCA, 20% prox-mid LAD, 90% D2 lesion. The culprit for the NSTEMI appeared to be the 90% ostial stenosis in second diagonal which is a 2 mm in diameter and supplies a relatively small territory. Managed medically.  . Cancer ([place])    Klatkin bile duct ca (2009)   . CKD (chronic kidney disease), stage III   . Diabetes mellitus   . GERD (gastroesophageal reflux disease)   . H/O gastric bypass   . Hepatitis   . Hyperlipidemia   . Hypertension   . Hypothyroid   . PVC's (premature ventricular contractions)    Health Maintenance  Topic Date Due  . URINE MICROALBUMIN  01/24/2017  . INFLUENZA VACCINE  06/18/2017  . HEMOGLOBIN A1C  07/02/2017  . OPHTHALMOLOGY EXAM  07/30/2017  . FOOT EXAM  11/29/2017  . MAMMOGRAM  06/25/2018  . COLONOSCOPY  07/12/2018  . TETANUS/TDAP  03/24/2024  . PNEUMOCOCCAL POLYSACCHARIDE VACCINE  Completed  . Hepatitis C Screening  Completed  . HIV Screening  Completed   Immunization History  Administered Date(s) Administered  . Influenza Split 09/27/2014, 09/08/2015  . Influenza Whole 08/18/2012  . Influenza,inj,quad, With Preservative 09/12/2016  . Influenza-Unspecified 08/31/2013  . Pneumococcal Conjugate-13 12/27/2014  . Pneumococcal Polysaccharide-23 11/18/2008  . Pneumococcal-Unspecified 06/14/1999  . Tdap 03/24/2014  . Zoster 03/11/2012   Past Surgical History:  Procedure Laterality Date  . ABDOMINAL HYSTERECTOMY  1990   BIL OOPHORECTOMY  . APPENDECTOMY    . Bile  Duct Resection  2009  . BREAST SURGERY     BIOPSY NEG  . BUNIONECTOMY Right 2005  . CARDIAC CATHETERIZATION N/A 11/13/2016   Procedure: Left Heart Cath and Coronary Angiography;  Surgeon: Wellington Hampshire, MD;  Location: [facility];  Service: Cardiovascular;  Laterality: N/A;  . CHOLECYSTECTOMY  1992  . GASTRIC BYPASS  2004  . TONSILLECTOMY    . TUBAL LIGATION    . VIDEO BRONCHOSCOPY WITH  ENDOBRONCHIAL ULTRASOUND N/A 10/12/2014   Procedure: VIDEO BRONCHOSCOPY WITH ENDOBRONCHIAL ULTRASOUND;  Surgeon: Brand Males, MD;  Location: [facility];  Service: Thoracic;  Laterality: N/A;   Family History  Problem Relation Age of Onset  . Colon cancer Mother   . Hypertension Mother   . Glaucoma Mother   . Diabetes Father   . Alzheimer's disease Father   . Heart attack Father 44  . Nephrolithiasis Father   . Emphysema Father     was a smoker  . Heart disease Father   . Nephrolithiasis Sister   . Heart attack Brother 79  . Nephrolithiasis Brother    Social History  Substance Use Topics  . Smoking status: Former Smoker    Packs/day: 2.00    Years: 5.00    Types: Cigarettes    Quit date: 11/18/1977  . Smokeless tobacco: Never Used  . Alcohol use Yes     Comment: 2-3 glasses of wine once per month     ROS Constitutional: Denies fever, chills, weight loss/gain, headaches, insomnia,  night sweats, and change in appetite. Does c/o fatigue. Eyes: Denies redness, blurred vision, diplopia, discharge, itchy, watery eyes.  ENT: Denies discharge, congestion, post nasal drip, epistaxis, sore throat, earache, hearing loss, dental pain, Tinnitus, Vertigo, Sinus pain, snoring.  Cardio: Denies chest pain, palpitations, irregular heartbeat, syncope, dyspnea, diaphoresis, orthopnea, PND, claudication, edema Respiratory: denies cough, dyspnea, DOE, pleurisy, hoarseness, laryngitis, wheezing.  Gastrointestinal: Denies dysphagia, heartburn, reflux, water brash, pain, cramps, nausea, vomiting,  bloating, diarrhea, constipation, hematemesis, melena, hematochezia, jaundice, hemorrhoids Genitourinary: Denies dysuria, frequency, urgency, nocturia, hesitancy, discharge, hematuria, flank pain Breast: Breast lumps, nipple discharge, bleeding.  Musculoskeletal: Denies arthralgia, myalgia, stiffness, Jt. Swelling, pain, limp, and strain/sprain. Denies falls. Skin: Denies puritis, rash, hives, warts, acne, eczema, changing in skin lesion Neuro: No weakness, tremor, incoordination, spasms, paresthesia, pain Psychiatric: Denies confusion, memory loss, sensory loss. Denies Depression. Endocrine: Denies change in weight, skin, hair change, nocturia, and paresthesia, diabetic polys, visual blurring, hyper / hypo glycemic episodes.  Heme/Lymph: No excessive bleeding, bruising, enlarged lymph nodes.  Physical Exam  BP 104/70   P 60   T 97 F    R 16   Ht 5\' 7"    Wt 160 lb 3.2 oz    BMI 25.09   General Appearance: Well nourished, well groomed and in no apparent distress.  Eyes: PERRLA, EOMs, conjunctiva no swelling or erythema, normal fundi and vessels. Sinuses: No frontal/maxillary tenderness ENT/Mouth: EACs patent / TMs  nl. Nares clear without erythema, swelling, mucoid exudates. Oral hygiene is good. No erythema, swelling, or exudate. Tongue normal, non-obstructing. Tonsils not swollen or erythematous. Hearing normal.  Neck: Supple, thyroid normal. No bruits, nodes or JVD. Respiratory: Respiratory effort normal.  BS equal and clear bilateral without rales, rhonci, wheezing or stridor. Cardio: Heart sounds are normal with regular rate and rhythm and no murmurs, rubs or gallops. Peripheral pulses are normal and equal bilaterally without edema. No aortic or femoral bruits. Chest: symmetric with normal excursions and percussion. Breasts: Symmetric, without lumps, nipple discharge, retractions, or fibrocystic changes.  Abdomen: Flat, soft with bowel sounds active. Nontender, no guarding,  rebound, hernias, masses, or organomegaly.  Lymphatics: Non tender without lymphadenopathy.  Musculoskeletal: Full ROM all peripheral extremities. S broad based slow gait. Skin: Warm and dry without rashes, lesions, cyanosis, clubbing or  ecchymosis.  Neuro: Cranial nerves intact, reflexes equal bilaterally. Normal muscle tone, no cerebellar symptoms. Sensation sl decreased in a stocking distribution to touch, vibratory and Monofilament.  Speech fluent today.   Assessment and Plan  1. Annual Preventative Screening Examination   2. Essential hypertension  - EKG 12-Lead - [place], RETROPERITNL ABD,  LTD - Urinalysis, Routine w reflex microscopic - Microalbumin / creatinine urine ratio - CBC with Differential/Platelet - BASIC METABOLIC PANEL WITH GFR - Magnesium - TSH  3. Mixed hyperlipidemia  - EKG 12-Lead - [place], RETROPERITNL ABD,  LTD - Hepatic function panel - Lipid panel - TSH  4. Type 2 diabetes mellitus with stage 3 chronic kidney disease, without long-term current use of insulin (HCC)  - EKG 12-Lead - [place], RETROPERITNL ABD,  LTD - HM DIABETES FOOT EXAM - LOW EXTREMITY NEUR EXAM DOCUM - Hemoglobin A1c - Insulin, random - metFORMIN-XR 500 MG 24 hr tablet; Take 4 tablets daily for Diabetes  Disp: 360 tablet; Rf: 1  5. Vitamin D deficiency  - VITAMIN D 25 Hydroxy   6. ASHD (arteriosclerotic heart disease)  - EKG 12-Lead  7. Malignant neoplasm of extrahepatic bile duct ([place])   8. Screening for ischemic heart disease  - EKG 12-Lead  9. Screening for AAA (aortic abdominal aneurysm)  - [place], RETROPERITNL ABD,  LTD  10. Fatigue, unspecified type  - Vitamin B12 - Iron and TIBC  11. Medication management  - Urinalysis, Routine w reflex microscopic - Microalbumin / creatinine urine ratio - CBC with Differential/Platelet - BASIC METABOLIC PANEL WITH GFR - Hepatic function panel - Magnesium - Lipid panel - TSH - Hemoglobin A1c - Insulin, random - VITAMIN D 25  Hydroxy   12. Screening for colorectal cancer  - POC Hemoccult Bld/Stl        Patient was counseled in prudent diet to achieve/maintain BMI less than 25 for weight control, BP monitoring, regular exercise and medications. Discussed med's effects and SE's. Screening labs and tests as requested with regular follow-up as recommended. Over 40 minutes of exam, counseling, chart review and high complex critical decision making was performed.

## 2017-03-03 ENCOUNTER — Encounter: Payer: Self-pay | Admitting: Internal Medicine

## 2017-03-03 ENCOUNTER — Ambulatory Visit (INDEPENDENT_AMBULATORY_CARE_PROVIDER_SITE_OTHER): Payer: 59 | Admitting: Internal Medicine

## 2017-03-03 VITALS — BP 104/70 | HR 60 | Temp 97.0°F | Resp 16 | Ht 67.0 in | Wt 160.2 lb

## 2017-03-03 DIAGNOSIS — Z1211 Encounter for screening for malignant neoplasm of colon: Secondary | ICD-10-CM

## 2017-03-03 DIAGNOSIS — Z136 Encounter for screening for cardiovascular disorders: Secondary | ICD-10-CM

## 2017-03-03 DIAGNOSIS — Z79899 Other long term (current) drug therapy: Secondary | ICD-10-CM | POA: Diagnosis not present

## 2017-03-03 DIAGNOSIS — R413 Other amnesia: Secondary | ICD-10-CM

## 2017-03-03 DIAGNOSIS — C240 Malignant neoplasm of extrahepatic bile duct: Secondary | ICD-10-CM

## 2017-03-03 DIAGNOSIS — N183 Chronic kidney disease, stage 3 unspecified: Secondary | ICD-10-CM

## 2017-03-03 DIAGNOSIS — I10 Essential (primary) hypertension: Secondary | ICD-10-CM

## 2017-03-03 DIAGNOSIS — E559 Vitamin D deficiency, unspecified: Secondary | ICD-10-CM

## 2017-03-03 DIAGNOSIS — E782 Mixed hyperlipidemia: Secondary | ICD-10-CM

## 2017-03-03 DIAGNOSIS — Z0000 Encounter for general adult medical examination without abnormal findings: Secondary | ICD-10-CM

## 2017-03-03 DIAGNOSIS — Z1212 Encounter for screening for malignant neoplasm of rectum: Secondary | ICD-10-CM

## 2017-03-03 DIAGNOSIS — Z0001 Encounter for general adult medical examination with abnormal findings: Secondary | ICD-10-CM

## 2017-03-03 DIAGNOSIS — I2510 Atherosclerotic heart disease of native coronary artery without angina pectoris: Secondary | ICD-10-CM

## 2017-03-03 DIAGNOSIS — R5383 Other fatigue: Secondary | ICD-10-CM

## 2017-03-03 DIAGNOSIS — E1122 Type 2 diabetes mellitus with diabetic chronic kidney disease: Secondary | ICD-10-CM

## 2017-03-03 DIAGNOSIS — R269 Unspecified abnormalities of gait and mobility: Secondary | ICD-10-CM

## 2017-03-03 LAB — CBC WITH DIFFERENTIAL/PLATELET
BASOS ABS: 0 {cells}/uL (ref 0–200)
Basophils Relative: 0 %
EOS PCT: 4 %
Eosinophils Absolute: 180 cells/uL (ref 15–500)
HEMATOCRIT: 38.1 % (ref 35.0–45.0)
Hemoglobin: 11.9 g/dL (ref 11.7–15.5)
LYMPHS PCT: 22 %
Lymphs Abs: 990 cells/uL (ref 850–3900)
MCH: 29 pg (ref 27.0–33.0)
MCHC: 31.2 g/dL — AB (ref 32.0–36.0)
MCV: 92.7 fL (ref 80.0–100.0)
MONO ABS: 315 {cells}/uL (ref 200–950)
MPV: 9.6 fL (ref 7.5–12.5)
Monocytes Relative: 7 %
Neutro Abs: 3015 cells/uL (ref 1500–7800)
Neutrophils Relative %: 67 %
Platelets: 276 10*3/uL (ref 140–400)
RBC: 4.11 MIL/uL (ref 3.80–5.10)
RDW: 13.7 % (ref 11.0–15.0)
WBC: 4.5 10*3/uL (ref 3.8–10.8)

## 2017-03-03 MED ORDER — METFORMIN HCL ER 500 MG PO TB24: ORAL_TABLET | ORAL | 1 refills | Status: DC

## 2017-03-03 NOTE — Patient Instructions (Signed)

## 2017-03-04 ENCOUNTER — Other Ambulatory Visit: Payer: Self-pay | Admitting: Internal Medicine

## 2017-03-04 DIAGNOSIS — R945 Abnormal results of liver function studies: Secondary | ICD-10-CM

## 2017-03-04 DIAGNOSIS — R7989 Other specified abnormal findings of blood chemistry: Secondary | ICD-10-CM

## 2017-03-04 LAB — INSULIN, RANDOM: INSULIN: 10.5 u[IU]/mL (ref 2.0–19.6)

## 2017-03-04 LAB — IRON AND TIBC
%SAT: 30 % (ref 11–50)
Iron: 73 ug/dL (ref 45–160)
TIBC: 245 ug/dL — AB (ref 250–450)
UIBC: 172 ug/dL (ref 125–400)

## 2017-03-04 LAB — HEPATIC FUNCTION PANEL
ALBUMIN: 2.5 g/dL — AB (ref 3.6–5.1)
ALT: 67 U/L — ABNORMAL HIGH (ref 6–29)
AST: 135 U/L — ABNORMAL HIGH (ref 10–35)
Alkaline Phosphatase: 216 U/L — ABNORMAL HIGH (ref 33–130)
BILIRUBIN INDIRECT: 0.1 mg/dL — AB (ref 0.2–1.2)
Bilirubin, Direct: 0.1 mg/dL (ref <=0.2)
TOTAL PROTEIN: 5.2 g/dL — AB (ref 6.1–8.1)
Total Bilirubin: 0.2 mg/dL (ref 0.2–1.2)

## 2017-03-04 LAB — VITAMIN D 25 HYDROXY (VIT D DEFICIENCY, FRACTURES): VIT D 25 HYDROXY: 98 ng/mL (ref 30–100)

## 2017-03-04 LAB — URINALYSIS, ROUTINE W REFLEX MICROSCOPIC
Bilirubin Urine: NEGATIVE
HGB URINE DIPSTICK: NEGATIVE
KETONES UR: NEGATIVE
Leukocytes, UA: NEGATIVE
Nitrite: NEGATIVE
PH: 6 (ref 5.0–8.0)
Protein, ur: NEGATIVE
SPECIFIC GRAVITY, URINE: 1.009 (ref 1.001–1.035)

## 2017-03-04 LAB — BASIC METABOLIC PANEL WITH GFR
BUN: 9 mg/dL (ref 7–25)
CALCIUM: 8.3 mg/dL — AB (ref 8.6–10.4)
CO2: 26 mmol/L (ref 20–31)
CREATININE: 0.86 mg/dL (ref 0.50–0.99)
Chloride: 102 mmol/L (ref 98–110)
GFR, EST AFRICAN AMERICAN: 84 mL/min (ref >=60)
GFR, EST NON AFRICAN AMERICAN: 73 mL/min (ref >=60)
GLUCOSE: 260 mg/dL — AB (ref 65–99)
Potassium: 4 mmol/L (ref 3.5–5.3)
Sodium: 141 mmol/L (ref 135–146)

## 2017-03-04 LAB — LIPID PANEL
CHOLESTEROL: 69 mg/dL (ref <200)
HDL: 23 mg/dL — ABNORMAL LOW (ref >50)
LDL Cholesterol: 28 mg/dL (ref <100)
TRIGLYCERIDES: 92 mg/dL (ref <150)
Total CHOL/HDL Ratio: 3 Ratio (ref <5.0)
VLDL: 18 mg/dL (ref <30)

## 2017-03-04 LAB — MICROALBUMIN / CREATININE URINE RATIO
Creatinine, Urine: 17 mg/dL — ABNORMAL LOW (ref 20–320)
Microalb, Ur: <0.2 mg/dL

## 2017-03-04 LAB — HEMOGLOBIN A1C
HEMOGLOBIN A1C: 6.7 % — AB (ref <5.7)
MEAN PLASMA GLUCOSE: 146 mg/dL

## 2017-03-04 LAB — MAGNESIUM: MAGNESIUM: 2.1 mg/dL (ref 1.5–2.5)

## 2017-03-04 LAB — TSH: TSH: 4.64 mIU/L — ABNORMAL HIGH

## 2017-03-04 LAB — VITAMIN B12: Vitamin B-12: 706 pg/mL (ref 200–1100)

## 2017-03-16 NOTE — Progress Notes (Signed)
Office Visit    Patient Name: Courtney Stevens Date of Encounter: 03/19/2017  Primary Care Provider:  [name], MD Primary Cardiologist:  P. Martinique, MD   Chief Complaint    61 year old female  who presents for f/u CAD  Past Medical History    Past Medical History:  Diagnosis Date  . Anemia    History of blood transfusion which is when pt reports being exposed to hepatitis in 2009.  Marland Kitchen Anxiety   . Asthma   . CAD (coronary artery disease)    a. 03/2016 St Echo: nl EF, no wma-->Nl study;  b. 11/13/16 NSTEMI/Cath: EF 55-65%, 40% prox RCA, 20% prox-mid LAD, 90% D2 lesion. The culprit for the NSTEMI appeared to be the 90% ostial stenosis in second diagonal which is a 2 mm in diameter and supplies a relatively small territory. Managed medically.  . Cancer ([place])    Klatkin bile duct ca (2009)   . CKD (chronic kidney disease), stage III   . Diabetes mellitus   . GERD (gastroesophageal reflux disease)   . H/O gastric bypass   . Hepatitis   . Hyperlipidemia   . Hypertension   . Hypothyroid   . PVC's (premature ventricular contractions)    Past Surgical History:  Procedure Laterality Date  . ABDOMINAL HYSTERECTOMY  1990   BIL OOPHORECTOMY  . APPENDECTOMY    . Bile Duct Resection  2009  . BREAST SURGERY     BIOPSY NEG  . BUNIONECTOMY Right 2005  . CARDIAC CATHETERIZATION N/A 11/13/2016   Procedure: Left Heart Cath and Coronary Angiography;  Surgeon: Wellington Hampshire, MD;  Location: [place] [facility];  Service: Cardiovascular;  Laterality: N/A;  . CHOLECYSTECTOMY  1992  . GASTRIC BYPASS  2004  . TONSILLECTOMY    . TUBAL LIGATION    . VIDEO BRONCHOSCOPY WITH ENDOBRONCHIAL ULTRASOUND N/A 10/12/2014   Procedure: VIDEO BRONCHOSCOPY WITH ENDOBRONCHIAL ULTRASOUND;  Surgeon: Brand Males, MD;  Location: [facility];  Service: Thoracic;  Laterality: N/A;    Allergies  Allergies  Allergen Reactions  . Ace Inhibitors Anaphylaxis  . Tilade [Nedocromil] Anaphylaxis  .  Reglan [Metoclopramide] Nausea And Vomiting  . 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 She is s/p nstemi in late December 2017 with cath revealing severe ostial D2 disease and otherwise nonobs disease.  The D2 was only a 15m vessel and thus med Rx was recommended.  She was also hypokalemic and her diuretics were adjusted.    In follow up she developed severe hyperglycemia and was placed on insulin. Later developed hypoglycemia with unresponsiveness. insulin dose was reduced. She complained of persistent fatigue and beta blocker was discontinued.   On Jan 29 she states she had a bad anginal attack. Developed SSCP unrelieved with 3 sl Ntg. Lasted 3 hours. Went to ED where Ecg and troponins were negative. No anginal attacks since then.   [facility]. She has hepatic/retroperitoneal masses x 2. These have been biopsed and  showed granulomatous inflammation. Recent MRI showed enlargement of one of the masses. LFTs elevated. She is being considered for surgical removal of mass.  She has multiple complaints today. Generally doesn't feel well. Balance is poor. She has right hip  pain. Numbness in her legs at night. BS remains labile. She is taking lasix and swelling has resolved. Denies any symptoms of angina.      Home Medications    Prior to Admission medications   Medication Sig Start Date End Date Taking? Authorizing Provider  albuterol (VENTOLIN HFA) 108 (90 BASE) MCG/ACT inhaler Inhale 2 puffs into the lungs every 4 (four) hours as needed for wheezing or shortness of breath. 09/08/15  Yes Vicie Mutters, PA-C  ALPRAZolam Duanne Moron) 1 MG tablet Take 1 mg by mouth 3 (three) times  daily as needed for anxiety.   Yes Historical Provider, MD  amitriptyline (ELAVIL) 25 MG tablet Take 25 mg by mouth at bedtime.   Yes Historical Provider, MD  antiseptic oral rinse (BIOTENE) LIQD 15 mLs by Mouth Rinse route as needed for dry mouth.   Yes Historical Provider, MD  aspirin EC 81 MG EC tablet Take 1 tablet (81 mg total) by mouth daily. 11/15/16  Yes Dayna N Dunn, PA-C  atorvastatin (LIPITOR) 40 MG tablet Take 1 tablet (40 mg total) by mouth every evening. 11/14/16  Yes Dayna N Dunn, PA-C  Blood Glucose Monitoring Suppl (ONE TOUCH ULTRA SYSTEM KIT) w/Device KIT 1 kit by Does not apply route once. 01/08/16  Yes Unk Pinto, MD  calcium citrate-vitamin D (CITRACAL+D) 315-200 MG-UNIT per tablet Take 1 tablet by mouth 3 (three) times daily.   Yes Historical Provider, MD  cetirizine (ZYRTEC) 10 MG tablet Take 10 mg by mouth every morning.     Yes Historical Provider, MD  cholecalciferol (VITAMIN D) 1000 units tablet Take 1,000 Units by mouth daily.    Yes Historical Provider, MD  ergocalciferol (VITAMIN D2) 50000 units capsule Take 50,000 Units by mouth daily. DOES NOT TAKE ON SUNDAYS   Yes Historical Provider, MD  fluticasone (FLONASE) 50 MCG/ACT nasal spray PLACE 2 SPRAYS INTO BOTH NOSTRILS DAILY. 10/15/16  Yes Brand Males, MD  glipiZIDE (GLUCOTROL) 5 MG tablet Take 5 mg by mouth daily before breakfast.   Yes Historical Provider, MD  glucose blood (ONE TOUCH TEST STRIPS) test strip CHECK BLOOD SUGAR 3 TIMES DAILY DUE TO POORLY CONTROLLED GLUCOSE-DX-E11.65 01/08/16  Yes Unk Pinto, MD  isosorbide mononitrate (IMDUR) 30 MG 24 hr tablet Take 1 tablet (30 mg total) by mouth daily. 11/15/16  Yes Dayna N Dunn, PA-C  Lancets (ONETOUCH ULTRASOFT) lancets Check blood sugar 3 times daily due to poorly controlled glucose-DX-E11.65. 01/08/16  Yes Unk Pinto, MD  levothyroxine (SYNTHROID, LEVOTHROID) 200 MCG tablet Take 200 mcg by mouth daily before breakfast. PT ALTERNATES 1/2 TABLET QOD  WITH 1 TABLET QOD   Yes Historical Provider, MD  lipase/protease/amylase (CREON) 12000 units CPEP capsule TAKE 1 CAPSULE BY MOUTH 3 TIMES DAILY. 07/02/16  Yes Unk Pinto, MD  metFORMIN (GLUCOPHAGE) 500 MG tablet TAKE 1-2 TABLETS BY MOUTH 3 TIMES DAILY. TAKE 1 TAB AT BREAKFAST, 1 AT LUNCH AND 2 AT [facility] 07/02/16  Yes Unk Pinto, MD  Multiple Vitamins-Minerals (MULTIVITAMIN WITH MINERALS) tablet Take 1 tablet by mouth daily.    Yes Historical Provider, MD  nitroGLYCERIN (NITROSTAT) 0.4 MG SL tablet Place 1 tablet (0.4 mg total) under the tongue every 5 (five) minutes as needed for chest pain. Up to 3 doses 11/14/16  Yes Dayna N Dunn, PA-C  OVER THE COUNTER MEDICATION Take 1 tablet by mouth daily. Calcium,magnesium and zinc in one.   Yes Historical Provider, MD  pantoprazole (PROTONIX) 40 MG tablet Take 40 mg by mouth every morning. 09/07/16  Yes Historical Provider, MD  sitaGLIPtin (JANUVIA) 100 MG tablet Take 1 tablet (100 mg total) by mouth daily. 07/02/16  Yes Unk Pinto, MD  sucralfate (CARAFATE) 1 g tablet TAKE 1 TABLET BY MOUTH 4 TIMES DAILY. 07/02/16  Yes Unk Pinto, MD    Review of Systems    As noted in HPI.  All other systems reviewed and are otherwise negative except as noted above.  Physical Exam    VS:  BP 126/80   Pulse 82   Ht '5\' 7"'  (1.702 m)   Wt 156 lb 9.6 oz (71 kg)   BMI 24.53 kg/m  , BMI Body mass index is 24.53 kg/m. GEN: Well nourished, well developed, in no acute distress.  HEENT: normal.  Neck: Supple, no JVD, carotid bruits, or masses. Cardiac: RRR, no murmurs, rubs, or gallops. No clubbing, cyanosis, edema.  Radials/DP/PT 2+ and equal bilaterally.  Respiratory:  Respirations regular and unlabored, clear to auscultation bilaterally. GI: Soft, nontender, nondistended, BS + x 4. MS: no deformity or atrophy. Skin: warm and dry, no rash. Neuro:  Strength and sensation are intact. Psych: Normal affect.  Accessory Clinical Findings    Lab Results   Component Value Date   WBC 4.5 03/03/2017   HGB 11.9 03/03/2017   HCT 38.1 03/03/2017   PLT 276 03/03/2017   GLUCOSE 260 (H) 03/03/2017   CHOL 69 03/03/2017   TRIG 92 03/03/2017   HDL 23 (L) 03/03/2017   LDLCALC 28 03/03/2017   ALT 67 (H) 03/03/2017   AST 135 (H) 03/03/2017   NA 141 03/03/2017   K 4.0 03/03/2017   CL 102 03/03/2017   CREATININE 0.86 03/03/2017   BUN 9 03/03/2017   CO2 26 03/03/2017   TSH 4.64 (H) 03/03/2017   INR 1.00 11/12/2016   HGBA1C 6.7 (H) 03/03/2017   MICROALBUR <0.2 03/03/2017     Assessment & Plan    1.  CAD:  S/p nstemi in December with ostial small vessel Diag dzs  not amenable to PCI due to small vessel size.  She had one episode of severe angina since then.  No angina on follow up today. She has been medically managed with asa, statin, and long acting nitrate. Beta blocker stopped due to fatigue.   2.  Essential HTN:  bp stable.  Cont nitrate.  3.  HL:  LDL 28.  Cont lipitor.  4.  DM II:  Now on insulin. Management per primary care.   5. CKD III:  Creat stable   6.  Hepatic mass. Planning possible surgery at [facility].   Bryanah Sidell Martinique, MD, [facility] 03/19/2017, 12:12 PM

## 2017-03-19 ENCOUNTER — Ambulatory Visit (INDEPENDENT_AMBULATORY_CARE_PROVIDER_SITE_OTHER): Payer: 59 | Admitting: Cardiology

## 2017-03-19 ENCOUNTER — Encounter: Payer: Self-pay | Admitting: Cardiology

## 2017-03-19 VITALS — BP 126/80 | HR 82 | Ht 67.0 in | Wt 156.6 lb

## 2017-03-19 DIAGNOSIS — E782 Mixed hyperlipidemia: Secondary | ICD-10-CM

## 2017-03-19 DIAGNOSIS — I10 Essential (primary) hypertension: Secondary | ICD-10-CM | POA: Diagnosis not present

## 2017-03-19 DIAGNOSIS — I2510 Atherosclerotic heart disease of native coronary artery without angina pectoris: Secondary | ICD-10-CM | POA: Diagnosis not present

## 2017-03-19 MED ORDER — ISOSORBIDE MONONITRATE ER 60 MG PO TB24: 60.0000 mg | ORAL_TABLET | Freq: Every day | ORAL | 3 refills | Status: DC

## 2017-03-19 NOTE — Patient Instructions (Signed)
Continue your current therapy  I will see you in 6 months.   

## 2017-03-27 ENCOUNTER — Telehealth: Payer: Self-pay | Admitting: Internal Medicine

## 2017-03-27 ENCOUNTER — Other Ambulatory Visit: Payer: Self-pay | Admitting: Internal Medicine

## 2017-03-27 DIAGNOSIS — N390 Urinary tract infection, site not specified: Secondary | ICD-10-CM

## 2017-03-27 MED ORDER — CIPROFLOXACIN HCL 500 MG PO TABS: 500.0000 mg | ORAL_TABLET | Freq: Two times a day (BID) | ORAL | 0 refills | Status: DC

## 2017-03-27 NOTE — Telephone Encounter (Signed)
U/C at [facility] showed 2 strains of pan-sensitive E.coli at she asked for Rx .   Rx- Cipro 500 mg #20 - sig: I tab bid x 10 days [facility]

## 2017-03-31 ENCOUNTER — Ambulatory Visit (INDEPENDENT_AMBULATORY_CARE_PROVIDER_SITE_OTHER): Payer: 59 | Admitting: Internal Medicine

## 2017-03-31 ENCOUNTER — Encounter: Payer: Self-pay | Admitting: Internal Medicine

## 2017-03-31 VITALS — BP 122/70 | HR 78 | Temp 97.7°F | Resp 16 | Ht 67.0 in | Wt 170.0 lb

## 2017-03-31 DIAGNOSIS — K644 Residual hemorrhoidal skin tags: Secondary | ICD-10-CM | POA: Diagnosis not present

## 2017-03-31 DIAGNOSIS — K611 Rectal abscess: Secondary | ICD-10-CM | POA: Diagnosis not present

## 2017-03-31 MED ORDER — PREDNISONE 20 MG PO TABS: ORAL_TABLET | ORAL | 0 refills | Status: DC

## 2017-03-31 MED ORDER — LEVOFLOXACIN 500 MG PO TABS: ORAL_TABLET | ORAL | 1 refills | Status: DC

## 2017-03-31 MED ORDER — TRIAMCINOLONE ACETONIDE 0.1 % EX CREA: 1.0000 "application " | TOPICAL_CREAM | Freq: Two times a day (BID) | CUTANEOUS | 0 refills | Status: DC

## 2017-03-31 NOTE — Patient Instructions (Signed)
Hemorrhoids Hemorrhoids are swollen veins in and around the rectum or anus. There are two types of hemorrhoids:  Internal hemorrhoids. These occur in the veins that are just inside the rectum. They may poke through to the outside and become irritated and painful.  External hemorrhoids. These occur in the veins that are outside of the anus and can be felt as a painful swelling or hard lump near the anus. Most hemorrhoids do not cause serious problems, and they can be managed with home treatments such as diet and lifestyle changes. If home treatments do not help your symptoms, procedures can be done to shrink or remove the hemorrhoids. What are the causes? This condition is caused by increased pressure in the anal area. This pressure may result from various things, including:  Constipation.  Straining to have a bowel movement.  Diarrhea.  Pregnancy.  Obesity.  Sitting for long periods of time.  Heavy lifting or other activity that causes you to strain.  Anal sex. What are the signs or symptoms? Symptoms of this condition include:  Pain.  Anal itching or irritation.  Rectal bleeding.  Leakage of stool (feces).  Anal swelling.  One or more lumps around the anus. How is this diagnosed? This condition can often be diagnosed through a visual exam. Other exams or tests may also be done, such as:  Examination of the rectal area with a gloved hand (digital rectal exam).  Examination of the anal canal using a small tube (anoscope).  A blood test, if you have lost a significant amount of blood.  A test to look inside the colon (sigmoidoscopy or colonoscopy). How is this treated? This condition can usually be treated at home. However, various procedures may be done if dietary changes, lifestyle changes, and other home treatments do not help your symptoms. These procedures can help make the hemorrhoids smaller or remove them completely. Some of these procedures involve surgery,  and others do not. Common procedures include:  Rubber band ligation. Rubber bands are placed at the base of the hemorrhoids to cut off the blood supply to them.  Sclerotherapy. Medicine is injected into the hemorrhoids to shrink them.  Infrared coagulation. A type of light energy is used to get rid of the hemorrhoids.  Hemorrhoidectomy surgery. The hemorrhoids are surgically removed, and the veins that supply them are tied off.  Stapled hemorrhoidopexy surgery. A circular stapling device is used to remove the hemorrhoids and use staples to cut off the blood supply to them. Follow these instructions at home: Eating and drinking   Eat foods that have a lot of fiber in them, such as whole grains, beans, nuts, fruits, and vegetables. Ask your health care provider about taking products that have added fiber (fiber supplements).  Drink enough fluid to keep your urine clear or pale yellow. Managing pain and swelling   Take warm sitz baths for 20 minutes, 3-4 times a day to ease pain and discomfort.  If directed, apply ice to the affected area. Using ice packs between sitz baths may be helpful.  Put ice in a plastic bag.  Place a towel between your skin and the bag.  Leave the ice on for 20 minutes, 2-3 times a day. General instructions   Take over-the-counter and prescription medicines only as told by your health care provider.  Use medicated creams or suppositories as told.  Exercise regularly.  Go to the bathroom when you have the urge to have a bowel movement. Do not wait.  Avoid  straining to have bowel movements.  Keep the anal area dry and clean. Use wet toilet paper or moist towelettes after a bowel movement.  Do not sit on the toilet for long periods of time. This increases blood pooling and pain. Contact a health care provider if:  You have increasing pain and swelling that are not controlled by treatment or medicine.  You have uncontrolled bleeding.  You have  difficulty having a bowel movement, or you are unable to have a bowel movement.  You have pain or inflammation outside the area of the hemorrhoids. +++++++++++++++++++++++++++++++++++   How to Take a [employer] A sitz bath is a warm water bath that is taken while you are sitting down. The water should only come up to your hips and should cover your buttocks. Your health care provider may recommend a sitz bath to help you:  Clean the lower part of your body, including your genital area.  With itching.  With pain.  With sore muscles or muscles that tighten or spasm. How to take a sitz bath Take 3-4 sitz baths per day or as told by your health care provider. 1. Partially fill a bathtub with warm water. You will only need the water to be deep enough to cover your hips and buttocks when you are sitting in it. 2. If your health care provider told you to put medicine in the water, follow the directions exactly. 3. Sit in the water and open the tub drain a little. 4. Turn on the warm water again to keep the tub at the correct level. Keep the water running constantly. 5. Soak in the water for 15-20 minutes or as told by your health care provider. 6. After the sitz bath, pat the affected area dry first. Do not rub it. 7. Be careful when you stand up after the sitz bath because you may feel dizzy. Contact a health care provider if:  Your symptoms get worse. Do not continue with sitz baths if your symptoms get worse.  You have new symptoms. Do not continue with sitz baths until you talk with your health care provider. This information is not intended to replace advice given to you by your health care provider. Make sure you discuss any questions you have with your health care provider. Document Released: 07/27/2004 Document Revised: 04/03/2016 Document Reviewed: 11/02/2014 Elsevier Interactive Patient Education  2017 [employer].

## 2017-03-31 NOTE — Progress Notes (Signed)
Subjective:    Patient ID: Courtney Stevens, female    DOB: [birth year], 61 y.o.   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     She was recently evaluated at [facility] for excision of a liver tumor and U/C grew 2 strains of E.coli, both sensitive to Cipro that she was started on 3-4 days ago.   Medication Sig  . albuterol HFA inhaler 1 - 2 puffs every 4  hrs as needed   . ALPRAZolam  1 MG tablet Take 1 mg by mouth 3 (three) times daily as needed for anxiety.  Marland Kitchen aspirin EC 81 MG EC Take 1 tablet (81 mg total) by mouth daily.  Marland Kitchen atorvastatin  40 MG tablet Take 1/2 tablet ( 20 mg ) daily  . calcium citrate-vit D  Take 1 tablet by mouth 3 (three) times daily.  . cetirizine10 MG tablet Take 10 mg by mouth every morning.    Marland Kitchen CREON 12000 units CPEP  TAKE 1 CAPSULE THREE TIMES A DAY  . cyclobenzaprine  10 MG  Take 1 tablet (10 mg total) by mouth at bedtime.  Marland Kitchen VITAMIN D 98338 units  Take 50,000 Units by mouth daily. DOES NOT TAKE ON SUNDAYS  . FLONASE nasal spray PLACE 2 SPRAYS INTO BOTH NOSTRILS DAILY.  . furosemide  40 MG tablet TAKE 1 TO 2 TABLETS TWICE A DAY FOR FLUID AND SWELLING  . glipiZIDE  5 MG tablet TAKE ONE-HALF (1/2) TO ONE TABLET THREE TIMES A DAY  . LANTUS  100 UNIT/ML injection Inject 3 Units into the skin 3 (three) times daily.  . IMDUR 60 MG  Take 1 tablet (60 mg total) by mouth daily.  Marland Kitchen levothyroxine  200 MCG tablet TAKE ONE-HALF (1/2) TO ONE TABLET DAILY   . metFORMIN- XR 500 MG  Take 4 tablets daily for Diabetes  . metoprolol tartrate 25 MG   . Multiple Vitamins-Minerals  Take 1 tablet by mouth daily.   Marland Kitchen NITROSTAT 0.4 MG   as needed  for chest pain  . Calcium,magnesium and zinc Take 1 tablet by mouth daily  . pantoprazole  40 MG tablet TAKE 1 TABLET DAILY  . sucralfate  1 g tablet TAKE 1 TABLET FOUR TIMES A DAY  . ciprofloxacin  500 MG tablet Take 1  tablet (500 mg total) by mouth 2 (two) times daily.   Allergies  Allergen Reactions  . Ace Inhibitors Anaphylaxis  . Tilade [Nedocromil] Anaphylaxis  . Reglan [Metoclopramide] Nausea And Vomiting  . Betadine [Povidone Iodine] Rash    iching    Past Medical History:  Diagnosis Date  . Anemia    History of blood transfusion which is when pt reports being exposed to hepatitis in 2009.  Marland Kitchen Anxiety   . Asthma   . CAD (coronary artery disease)    a. 03/2016 St Echo: nl EF, no wma-->Nl study;  b. 11/13/16 NSTEMI/Cath: EF 55-65%, 40% prox RCA, 20% prox-mid LAD, 90% D2 lesion. The culprit for the NSTEMI appeared to be the 90% ostial stenosis in second diagonal which is a 2 mm in diameter and supplies a relatively small territory. Managed medically.  . Cancer ([place])    Klatkin bile duct ca (2009)   . CKD (chronic kidney disease), stage III   . Diabetes mellitus   . GERD (gastroesophageal reflux disease)   . H/O  gastric bypass   . Hepatitis   . Hyperlipidemia   . Hypertension   . Hypothyroid   . PVC's (premature ventricular contractions)     Past Surgical History:  Procedure Laterality Date  . ABDOMINAL HYSTERECTOMY  1990   BIL OOPHORECTOMY  . APPENDECTOMY    . Bile Duct Resection  2009  . BREAST SURGERY     BIOPSY NEG  . BUNIONECTOMY Right 2005  . CARDIAC CATHETERIZATION N/A 11/13/2016   Procedure: Left Heart Cath and Coronary Angiography;  Surgeon: Wellington Hampshire, MD;  Location: [facility];  Service: Cardiovascular;  Laterality: N/A;  . CHOLECYSTECTOMY  1992  . GASTRIC BYPASS  2004  . TONSILLECTOMY    . TUBAL LIGATION    . VIDEO BRONCHOSCOPY WITH ENDOBRONCHIAL ULTRASOUND N/A 10/12/2014   Procedure: VIDEO BRONCHOSCOPY WITH ENDOBRONCHIAL ULTRASOUND;  Surgeon: Brand Males, MD;  Location: [facility];  Service: Thoracic;  Laterality: N/A;   Review of Systems  10 point systems review negative except as above.    Objective:   Physical Exam  BP 122/70   Pulse 78   Temp  97.7 F (36.5 C)   Resp 16   Ht 5\' 7"  (1.702 m)   Wt 170 lb (77.1 kg)   BMI 26.63 kg/m   Appears very uncomfortable, but in no distress.  HEENT - WNL. Neck - supple. Nl Thyroid. Carotids 2+. Chest - Clear equal BS. Cor - Nl HS. RRR w/o sig MGR. PP 1(+). No edema. Abd - Soft benign.   Rectal exam find a very large 1-1.5 inch tender engorged erythematous hemorrhoidal tag.  MS- FROM w/o deformities. Gait Nl. Neuro - Nl w/o focal abnormalities.    Assessment & Plan:   1. Hemorrhoidal skin tag  - predniSONE (DELTASONE) 20 MG tablet; 1 tab 3 x day for 2 days, then 1 tab 2 x day for 2 days, then 1 tab 1 x day for 3 days  Dispense: 13 tablet; Refill: 0  - triamcinolone cream (KENALOG) 0.1 %; Apply 1 application topically 2 (two) times daily.  Dispense: 30 g; Refill: 0 - Ambulatory referral to General Surgery  2. Perirectal cellulitis- suspected   - levofloxacin (LEVAQUIN) 500 MG tablet; Take 1 tablet now & at bedtime, then daily with food for infection  Dispense: 15 tablet; Refill: 1  - Ambulatory referral to General Surgery

## 2017-04-03 ENCOUNTER — Ambulatory Visit (INDEPENDENT_AMBULATORY_CARE_PROVIDER_SITE_OTHER): Payer: 59 | Admitting: Podiatry

## 2017-04-03 ENCOUNTER — Encounter: Payer: Self-pay | Admitting: Podiatry

## 2017-04-03 DIAGNOSIS — B351 Tinea unguium: Secondary | ICD-10-CM | POA: Diagnosis not present

## 2017-04-03 DIAGNOSIS — E119 Type 2 diabetes mellitus without complications: Secondary | ICD-10-CM | POA: Diagnosis not present

## 2017-04-03 DIAGNOSIS — Q828 Other specified congenital malformations of skin: Secondary | ICD-10-CM

## 2017-04-03 DIAGNOSIS — M79676 Pain in unspecified toe(s): Secondary | ICD-10-CM

## 2017-04-05 NOTE — Progress Notes (Signed)
She presents with a chief complaint of painful calluses and painful toenails bilateral.  Objective: Toenails along thick yellow dystrophic onychomycotic no open lesions or wounds are noted. Multiple porokeratosis plantar aspect of the bilateral foot.  Assessment: Porokeratosis and painful elongated toenails with onychomycosis.  Plan: Debridement of all reactive hyperkeratotic tissue debridement of toenails 1 through 5 bilateral.

## 2017-04-06 ENCOUNTER — Encounter: Payer: Self-pay | Admitting: Internal Medicine

## 2017-04-07 ENCOUNTER — Other Ambulatory Visit: Payer: Self-pay | Admitting: Internal Medicine

## 2017-04-07 MED ORDER — GABAPENTIN 100 MG PO CAPS: ORAL_CAPSULE | ORAL | 0 refills | Status: DC

## 2017-04-08 ENCOUNTER — Ambulatory Visit (INDEPENDENT_AMBULATORY_CARE_PROVIDER_SITE_OTHER): Payer: 59 | Admitting: Neurology

## 2017-04-08 ENCOUNTER — Encounter: Payer: Self-pay | Admitting: Neurology

## 2017-04-08 VITALS — BP 110/74 | HR 78 | Ht 67.0 in | Wt 168.0 lb

## 2017-04-08 DIAGNOSIS — W19XXXA Unspecified fall, initial encounter: Secondary | ICD-10-CM

## 2017-04-08 DIAGNOSIS — R2689 Other abnormalities of gait and mobility: Secondary | ICD-10-CM

## 2017-04-08 NOTE — Progress Notes (Signed)
[facility]    Provider:  Dr Jaynee Eagles Referring Provider: Unk Pinto, MD Primary Care Physician:  Unk Pinto, MD  CC:  Gait disorder  HPI:  Courtney Stevens is a 61 y.o. female here as a referral from Dr. Melford Aase for Gait disorder. 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 On Christmas day she had a heart attack, on Jan 11th she was started on Insulin for diabetes and her glucose decreased to 17 she was unconscious for several hours. She feels since then she has memory loss and difficulty spelling words, she feels she stumbles and walks to the right. She is highly sensitive to insulin. Since starting insulin she has improved her blood sugar but right now she in on prednisone. She is having UTIs bc immune system it low and she has ecoli and is seeing an infectious disease doctor and she has a tumor in her liver and is being treated and now has painful hemorrhoids. 3 months ago her HgbA1c was 7.4 and 1 month ago was 6.7. No neck pain or weakness or focal weakness. She fell Saturday she was in the bathtub and she was trying to get out. No other falls. No back pain. No radicular symptoms. She does not do any balance exercises and has not been to PT for balance. She has leg pain and in the right hip and Lyrica helps.   Reviewed notes, labs and imaging from outside physicians, which showed:  MRI of the brain 12/19/2016 personally reviewed and agree wit the following:  IMPRESSION: 1. No acute intracranial abnormality. Normal normal enhancement of the brain. 2. Mild chronic microvascular ischemic changes and parenchymal volume loss.  Review primary care notes. In January 28 presented with complaints of gait difficulty and stumbling, horizontal  nystagmus difficulties with speech, spelling dysnomia and on February 1 had a brain MRI which revealed no acute abnormalities but did show mild chronic microvascular ischemic changes with parenchymal volume loss. Patient's symptoms of persistent intermittently. Reviewed labs and hemoglobin A1c 7.4 3 months ago but 6.7 most recently, vitamin B12 normal 706 CBC unremarkable, CMP significant for elevated glucose 260, hepatic function panel showed elevated alkaline phosphatase, elevated AST and elevated ALT, TSH was slightly elevated 4.64 in February of this year.  Review of Systems: Patient complains of symptoms per HPI as well as the following symptoms: NO cp, NO sob. Pertinent negatives per HPI. All others negative.   Social History   Social History  . Marital status: Married    Spouse name: N/A  . Number of children: 2  . Years of education: 12+   Occupational History  . [employer]   Social History Main Topics  . Smoking status: Former Smoker    Packs/day: 2.00    Years: 5.00    Types: Cigarettes    Quit date: 11/18/1977  . Smokeless tobacco: Never Used  . Alcohol use 0.6 oz/week    1 Glasses of wine per week     Comment: Rare  . Drug use: No  . Sexual activity: No   Other Topics Concern  . Not on file   Social History Narrative   Lives at home w/ her husband   Right-handed   Caffeine: 2 cups coffee per day    Family History  Problem Relation Age of Onset  .  Colon cancer Mother   . Hypertension Mother   . Glaucoma Mother   . Stroke Mother   . Diabetes Father   . Alzheimer's disease Father   . Heart attack Father 21  . Nephrolithiasis Father   . Emphysema Father        was a smoker  . Heart disease Father   . Nephrolithiasis Sister   . Heart attack Brother 27  . Nephrolithiasis Brother   . Neuropathy Neg Hx     Past Medical History:  Diagnosis Date  . Anemia    History of blood transfusion which is when pt reports being exposed to  hepatitis in 2009.  Marland Kitchen Anxiety   . Asthma   . CAD (coronary artery disease)    a. 03/2016 St Echo: nl EF, no wma-->Nl study;  b. 11/13/16 NSTEMI/Cath: EF 55-65%, 40% prox RCA, 20% prox-mid LAD, 90% D2 lesion. The culprit for the NSTEMI appeared to be the 90% ostial stenosis in second diagonal which is a 2 mm in diameter and supplies a relatively small territory. Managed medically.  . Cancer ([place])    Klatkin bile duct ca (2009)   . CKD (chronic kidney disease), stage III   . Diabetes mellitus   . GERD (gastroesophageal reflux disease)   . H/O gastric bypass   . Hemorrhoid   . Hepatitis   . Hyperlipidemia   . Hypertension   . Hypothyroid   . Liver tumor   . PVC's (premature ventricular contractions)   . Q fever 2015    Past Surgical History:  Procedure Laterality Date  . ABDOMINAL HYSTERECTOMY  1990   BIL OOPHORECTOMY  . APPENDECTOMY    . Bile Duct Resection  2009   Whipple  . BREAST SURGERY     BIOPSY NEG  . BUNIONECTOMY Right 2005  . CARDIAC CATHETERIZATION N/A 11/13/2016   Procedure: Left Heart Cath and Coronary Angiography;  Surgeon: Wellington Hampshire, MD;  Location: [place] [facility];  Service: Cardiovascular;  Laterality: N/A;  . CHOLECYSTECTOMY  1992  . GASTRIC BYPASS  2004  . TONSILLECTOMY    . TUBAL LIGATION    . VIDEO BRONCHOSCOPY WITH ENDOBRONCHIAL ULTRASOUND N/A 10/12/2014   Procedure: VIDEO BRONCHOSCOPY WITH ENDOBRONCHIAL ULTRASOUND;  Surgeon: Brand Males, MD;  Location: [facility];  Service: Thoracic;  Laterality: N/A;    Current Outpatient Prescriptions  Medication Sig Dispense Refill  . albuterol (VENTOLIN HFA) 108 (90 BASE) MCG/ACT inhaler Inhale 2 puffs into the lungs every 4 (four) hours as needed for wheezing or shortness of breath. 1 Inhaler 4  . ALPRAZolam (XANAX) 1 MG tablet Take 1 mg by mouth 3 (three) times daily as needed for anxiety.    Marland Kitchen antiseptic oral rinse (BIOTENE) LIQD 15 mLs by Mouth Rinse route as needed for dry mouth.    Marland Kitchen aspirin EC 81 MG  EC tablet Take 1 tablet (81 mg total) by mouth daily. 90 tablet 3  . atorvastatin (LIPITOR) 40 MG tablet Take 1/2 tablet ( 20 mg ) daily 90 tablet 3  . Blood Glucose Monitoring Suppl (ONE TOUCH ULTRA SYSTEM KIT) w/Device KIT 1 kit by Does not apply route once. 1 each 0  . calcium citrate-vitamin D (CITRACAL+D) 315-200 MG-UNIT per tablet Take 1 tablet by mouth 3 (three) times daily.    . cetirizine (ZYRTEC) 10 MG tablet Take 10 mg by mouth every morning.      Marland Kitchen CREON 12000 units CPEP capsule TAKE 1 CAPSULE THREE TIMES A DAY 270  capsule 1  . ergocalciferol (VITAMIN D2) 50000 units capsule Take 50,000 Units by mouth daily. DOES NOT TAKE ON SUNDAYS    . fluticasone (FLONASE) 50 MCG/ACT nasal spray PLACE 2 SPRAYS INTO BOTH NOSTRILS DAILY. 16 g 1  . FREESTYLE LITE test strip USE TO CHECK BLOOD SUGAR 3 TIMES A DAY DUE TO POORLY CONTROLLED DIABETES. DX E11.65 100 each 3  . furosemide (LASIX) 40 MG tablet TAKE 1 TO 2 TABLETS TWICE A DAY FOR FLUID AND SWELLING 360 tablet 1  . gabapentin (NEURONTIN) 100 MG capsule Take 1 to 3 capsules / day for Neuritis pains 90 capsule 0  . glipiZIDE (GLUCOTROL) 5 MG tablet TAKE ONE-HALF (1/2) TO ONE TABLET THREE TIMES A DAY IF NEEDED FOR ELEVATED GLUCOSE 270 tablet 1  . insulin glargine (LANTUS) 100 UNIT/ML injection Inject 3 Units into the skin 3 (three) times daily.    . Insulin Pen Needle 32G X 4 MM MISC Inject insulin 1-3 times daily SQ 100 each 4  . isosorbide mononitrate (IMDUR) 60 MG 24 hr tablet Take 1 tablet (60 mg total) by mouth daily. 90 tablet 3  . Lancets (ONETOUCH ULTRASOFT) lancets Check blood sugar 3 times daily due to poorly controlled glucose-DX-E11.65. 100 each 12  . levothyroxine (SYNTHROID, LEVOTHROID) 200 MCG tablet TAKE ONE-HALF (1/2) TO ONE TABLET DAILY BEFORE BREAKFAST ALTERNATE TAKING 100 MCG AND 200 MCG 90 tablet 1  . metFORMIN (GLUCOPHAGE XR) 500 MG 24 hr tablet Take 4 tablets daily for Diabetes 360 tablet 1  . metoprolol tartrate (LOPRESSOR) 25  MG tablet     . Multiple Vitamins-Minerals (MULTIVITAMIN WITH MINERALS) tablet Take 1 tablet by mouth daily.     . nitroGLYCERIN (NITROSTAT) 0.4 MG SL tablet Place 1 tablet (0.4 mg total) under the tongue every 5 (five) minutes as needed for chest pain. Up to 3 doses 25 tablet 3  . OVER THE COUNTER MEDICATION Take 1 tablet by mouth daily. Calcium,magnesium and zinc in one.    . pantoprazole (PROTONIX) 40 MG tablet TAKE 1 TABLET DAILY 90 tablet 1  . sucralfate (CARAFATE) 1 g tablet TAKE 1 TABLET FOUR TIMES A DAY 360 tablet 1  . triamcinolone cream (KENALOG) 0.1 % Apply 1 application topically 2 (two) times daily. 30 g 0  . cyclobenzaprine (FLEXERIL) 10 MG tablet Take 1 tablet (10 mg total) by mouth at bedtime. 30 tablet 0   No current facility-administered medications for this visit.     Allergies as of 04/08/2017 - Review Complete 04/08/2017  Allergen Reaction Noted  . Ace inhibitors Anaphylaxis 02/19/2013  . Tilade [nedocromil] Anaphylaxis 03/20/2012  . Reglan [metoclopramide] Nausea And Vomiting 09/05/2013  . Betadine [povidone iodine] Rash 08/19/2012    Vitals: BP 110/74   Pulse 78   Ht _0  (1.702 m)   Wt 168 lb (76.2 kg)   BMI 26.31 kg/m  Last Weight:  Wt Readings from Last 1 Encounters:  04/08/17 168 lb (76.2 kg)   Last Height:   Ht Readings from Last 1 Encounters:  04/08/17 _1  (1.702 m)    Physical exam: Exam: Gen: NAD, conversant, well nourised, well groomed                     CV: RRR, +SEM. No Carotid Bruits. + peripheral edema, warm, nontender Eyes: Conjunctivae clear without exudates or hemorrhage  Neuro: Detailed Neurologic Exam  Speech:    Speech is normal; fluent and spontaneous with normal comprehension.  Cognition:  MMSE -  Mini Mental State Exam 04/08/2017  Orientation to time 5  Orientation to Place 5  Registration 3  Attention/ Calculation 5  Recall 3  Language- name 2 objects 2  Language- repeat 1  Language- follow 3 step command 3    Language- read & follow direction 1  Write a sentence 1  Copy design 1  Total score 30      The patient is oriented to person, place, and time;     recent and remote memory intact;     language fluent;     normal attention, concentration,     fund of knowledge Cranial Nerves:    The pupils are equal, round, and reactive to light. The fundi are normal and spontaneous venous pulsations are present. Visual fields are full to finger confrontation. Extraocular movements are intact. Trigeminal sensation is intact and the muscles of mastication are normal. The face is symmetric. The palate elevates in the midline. Hearing intact. Voice is normal. Shoulder shrug is normal. The tongue has normal motion without fasciculations.   Coordination:    Normal finger to nose and heel to shin. Normal rapid alternating movements.   Gait:    Heel-toe are normal , imbalance with tandem  Motor Observation:    No asymmetry, no atrophy, and no involuntary movements noted. Tone:    Normal muscle tone.    Posture:    Posture is normal. normal erect    Strength: 3/5 proximal weakness. Otherwise strength is V/V in the upper and lower limbs.      Sensation: intact to LT     Reflex Exam:  DTR's:    Deep tendon reflexes in the upper and lower extremities are normal bilaterally.   Toes:    The toes are downgoing bilaterally.   Clonus:    Clonus is absent.      Assessment/Plan:  61 year old with complaints of imbalance and memory loss since an episode of hypoglycemia and in the setting of multiple medical problems and stress. She does not practice balance exercises. She has weakness in the LE and hip and leg pain. No parkinsonian symptoms, she does have imbalance on tandem and +romberg. Will send to PT for gait and safety and falls. Discussed falls and safety precautions. If no improvement can consider imaging such as lumbar or cervical MRIs. RTC after PT.   Orders Placed This Encounter  Procedures   . Ambulatory referral to Physical Therapy   Cc: Dr. Amanda Pea, [facility] [address] [place], [place] [address]  Phone [phone] Fax [phone]

## 2017-04-08 NOTE — Patient Instructions (Signed)
Remember to drink plenty of fluid, eat healthy meals and do not skip any meals. Try to eat protein with a every meal and eat a healthy snack such as fruit or nuts in between meals. Try to keep a regular sleep-wake schedule and try to exercise daily, particularly in the form of walking, 20-30 minutes a day, if you can.   As far as diagnostic testing: Physical Therapy  I would like to see you back in 3 months, sooner if we need to. Please call us with any interim questions, concerns, problems, updates or refill requests.   Our phone number is [phone]. We also have an after hours call service for urgent matters and there is a physician on-call for urgent questions. For any emergencies you know to call 911 or go to the nearest emergency room  It is important to avoid accidents which may result in broken bones.  Here are a few ideas on how to make your home safer so you will be less likely to trip or fall.  1. Use nonskid mats or non slip strips in your shower or tub, on your bathroom floor and around sinks.  If you know that you have spilled water, wipe it up! 2. In the bathroom, it is important to have properly installed grab bars on the walls or on the edge of the tub.  Towel racks are NOT strong enough for you to hold onto or to pull on for support. 3. Stairs and hallways should have enough light.  Add lamps or night lights if you need ore light. 4. It is good to have handrails on both sides of the stairs if possible.  Always fix broken handrails right away. 5. It is important to see the edges of steps.  Paint the edges of outdoor steps white so you can see them better.  Put colored tape on the edge of inside steps. 6. Throw-rugs are dangerous because they can slide.  Removing the rugs is the best idea, but if they must stay, add adhesive carpet tape to prevent slipping. 7. Do not keep things on stairs or in the halls.  Remove small furniture that blocks the halls as it may cause you to trip.   Keep telephone and electrical cords out of the way where you walk. 8. Always were sturdy, rubber-soled shoes for good support.  Never wear just socks, especially on the stairs.  Socks may cause you to slip or fall.  Do not wear full-length housecoats as you can easily trip on the bottom.  9. Place the things you use the most on the shelves that are the easiest to reach.  If you use a stepstool, make sure it is in good condition.  If you feel unsteady, DO NOT climb, ask for help. 10. If a health professional advises you to use a cane or walker, do not be ashamed.  These items can keep you from falling and breaking your bones.

## 2017-04-09 ENCOUNTER — Encounter: Payer: Self-pay | Admitting: Neurology

## 2017-04-15 ENCOUNTER — Ambulatory Visit: Payer: Managed Care, Other (non HMO) | Admitting: Podiatry

## 2017-04-24 ENCOUNTER — Other Ambulatory Visit: Payer: Self-pay

## 2017-04-24 DIAGNOSIS — Z1211 Encounter for screening for malignant neoplasm of colon: Secondary | ICD-10-CM

## 2017-04-24 DIAGNOSIS — Z1212 Encounter for screening for malignant neoplasm of rectum: Principal | ICD-10-CM

## 2017-04-24 LAB — POC HEMOCCULT BLD/STL (HOME/3-CARD/SCREEN)
Card #3 Fecal Occult Blood, POC: NEGATIVE
FECAL OCCULT BLD: NEGATIVE
Fecal Occult Blood, POC: NEGATIVE

## 2017-04-25 ENCOUNTER — Telehealth: Payer: Self-pay | Admitting: *Deleted

## 2017-04-25 ENCOUNTER — Other Ambulatory Visit: Payer: Self-pay | Admitting: *Deleted

## 2017-04-25 ENCOUNTER — Other Ambulatory Visit: Payer: Self-pay | Admitting: Internal Medicine

## 2017-04-25 ENCOUNTER — Encounter: Payer: Self-pay | Admitting: Internal Medicine

## 2017-04-25 MED ORDER — CYCLOBENZAPRINE HCL 10 MG PO TABS: ORAL_TABLET | ORAL | 0 refills | Status: DC

## 2017-04-25 MED ORDER — FLUTICASONE PROPIONATE 50 MCG/ACT NA SUSP: NASAL | 1 refills | Status: AC

## 2017-04-25 MED ORDER — METOPROLOL TARTRATE 25 MG PO TABS: 25.0000 mg | ORAL_TABLET | Freq: Two times a day (BID) | ORAL | 1 refills | Status: DC

## 2017-04-25 MED ORDER — INSULIN GLARGINE 100 UNITS/ML SOLOSTAR PEN: 3.0000 [IU] | PEN_INJECTOR | Freq: Three times a day (TID) | SUBCUTANEOUS | 3 refills | Status: DC

## 2017-04-25 MED ORDER — INSULIN GLARGINE 100 UNITS/ML SOLOSTAR PEN: PEN_INJECTOR | SUBCUTANEOUS | 3 refills | Status: AC

## 2017-04-25 MED ORDER — ATORVASTATIN CALCIUM 40 MG PO TABS: ORAL_TABLET | ORAL | 1 refills | Status: DC

## 2017-04-25 NOTE — Telephone Encounter (Signed)
Patient sent a MYChart message regarding muscle cramps.  Per DR Melford Aase, increase the Gabapentin 100 mg capsules to 3 capsules 3 times a day and a refill was sent in for Flexeril 10 mg tablets.  The patient will try the med changes over the weekend and will call back with the status of her cramps.

## 2017-05-01 ENCOUNTER — Ambulatory Visit: Payer: 59 | Admitting: Podiatry

## 2017-05-02 ENCOUNTER — Encounter: Payer: Self-pay | Admitting: Internal Medicine

## 2017-05-02 ENCOUNTER — Other Ambulatory Visit: Payer: Self-pay | Admitting: Internal Medicine

## 2017-05-02 MED ORDER — ALPRAZOLAM 1 MG PO TABS: ORAL_TABLET | ORAL | 5 refills | Status: AC

## 2017-05-06 ENCOUNTER — Other Ambulatory Visit: Payer: Self-pay | Admitting: Internal Medicine

## 2017-05-06 ENCOUNTER — Other Ambulatory Visit (HOSPITAL_COMMUNITY): Payer: Self-pay | Admitting: General Surgery

## 2017-05-06 ENCOUNTER — Telehealth: Payer: Self-pay | Admitting: *Deleted

## 2017-05-06 DIAGNOSIS — D649 Anemia, unspecified: Secondary | ICD-10-CM

## 2017-05-06 DIAGNOSIS — Z79899 Other long term (current) drug therapy: Secondary | ICD-10-CM

## 2017-05-06 DIAGNOSIS — E876 Hypokalemia: Secondary | ICD-10-CM

## 2017-05-06 DIAGNOSIS — C221 Intrahepatic bile duct carcinoma: Secondary | ICD-10-CM

## 2017-05-06 MED ORDER — METOCLOPRAMIDE HCL 10 MG PO TABS: ORAL_TABLET | ORAL | 2 refills | Status: DC

## 2017-05-06 NOTE — Telephone Encounter (Signed)
Patient called and reported she has been having acid reflux at night. Per Dr Melford Aase, an RX was sent in for Metoclopramide to take at bedtime.  The patient reports her BP is low at 94/62 and her legs and ankles are swollen.  Per Dr Melford Aase, stop the Lasix and Lopressor for now, increase her fluid intake and monitor her BP.  The patient is aware and will call back with her readings.

## 2017-05-07 ENCOUNTER — Encounter: Payer: Self-pay | Admitting: Internal Medicine

## 2017-05-07 ENCOUNTER — Other Ambulatory Visit: Payer: Self-pay | Admitting: Internal Medicine

## 2017-05-07 ENCOUNTER — Other Ambulatory Visit: Payer: 59

## 2017-05-07 DIAGNOSIS — R531 Weakness: Secondary | ICD-10-CM | POA: Diagnosis not present

## 2017-05-07 DIAGNOSIS — D649 Anemia, unspecified: Secondary | ICD-10-CM

## 2017-05-07 DIAGNOSIS — E876 Hypokalemia: Secondary | ICD-10-CM

## 2017-05-07 DIAGNOSIS — Z79899 Other long term (current) drug therapy: Secondary | ICD-10-CM

## 2017-05-07 LAB — CBC WITH DIFFERENTIAL/PLATELET
BASOS ABS: 0 {cells}/uL (ref 0–200)
Basophils Relative: 0 %
Eosinophils Absolute: 72 cells/uL (ref 15–500)
Eosinophils Relative: 1 %
HCT: 36.3 % (ref 35.0–45.0)
HEMOGLOBIN: 11.8 g/dL (ref 11.7–15.5)
LYMPHS ABS: 1080 {cells}/uL (ref 850–3900)
Lymphocytes Relative: 15 %
MCH: 30.1 pg (ref 27.0–33.0)
MCHC: 32.5 g/dL (ref 32.0–36.0)
MCV: 92.6 fL (ref 80.0–100.0)
MONO ABS: 576 {cells}/uL (ref 200–950)
MPV: 9.1 fL (ref 7.5–12.5)
Monocytes Relative: 8 %
NEUTROS PCT: 76 %
Neutro Abs: 5472 cells/uL (ref 1500–7800)
Platelets: 278 10*3/uL (ref 140–400)
RBC: 3.92 MIL/uL (ref 3.80–5.10)
RDW: 15.2 % — ABNORMAL HIGH (ref 11.0–15.0)
WBC: 7.2 10*3/uL (ref 3.8–10.8)

## 2017-05-07 LAB — BASIC METABOLIC PANEL WITH GFR
BUN: 11 mg/dL (ref 7–25)
CALCIUM: 8.2 mg/dL — AB (ref 8.6–10.4)
CHLORIDE: 99 mmol/L (ref 98–110)
CO2: 29 mmol/L (ref 20–31)
Creat: 1.37 mg/dL — ABNORMAL HIGH (ref 0.50–0.99)
GFR, EST NON AFRICAN AMERICAN: 41 mL/min — AB (ref >=60)
GFR, Est African American: 48 mL/min — ABNORMAL LOW (ref >=60)
GLUCOSE: 183 mg/dL — AB (ref 65–99)
Potassium: 3.6 mmol/L (ref 3.5–5.3)
SODIUM: 137 mmol/L (ref 135–146)

## 2017-05-07 LAB — IRON,TIBC AND FERRITIN PANEL
%SAT: 25 % (ref 11–50)
Ferritin: 103 ng/mL (ref 20–288)
IRON: 64 ug/dL (ref 45–160)
TIBC: 255 ug/dL (ref 250–450)

## 2017-05-07 LAB — RETICULOCYTES
ABS RETIC: 66640 {cells}/uL (ref 20000–80000)
RBC.: 3.92 MIL/uL (ref 3.80–5.10)
RETIC CT PCT: 1.7 %

## 2017-05-08 ENCOUNTER — Encounter: Payer: Self-pay | Admitting: Internal Medicine

## 2017-05-09 ENCOUNTER — Encounter: Payer: Self-pay | Admitting: Internal Medicine

## 2017-05-09 ENCOUNTER — Ambulatory Visit (HOSPITAL_COMMUNITY)
Admission: RE | Admit: 2017-05-09 | Discharge: 2017-05-09 | Disposition: A | Discharge status: Home / Self Care | Payer: 59 | Source: Ambulatory Visit | Attending: General Surgery | Admitting: General Surgery

## 2017-05-09 DIAGNOSIS — J90 Pleural effusion, not elsewhere classified: Secondary | ICD-10-CM | POA: Insufficient documentation

## 2017-05-09 DIAGNOSIS — R188 Other ascites: Secondary | ICD-10-CM | POA: Diagnosis not present

## 2017-05-09 DIAGNOSIS — J9811 Atelectasis: Secondary | ICD-10-CM | POA: Diagnosis not present

## 2017-05-09 DIAGNOSIS — I2510 Atherosclerotic heart disease of native coronary artery without angina pectoris: Secondary | ICD-10-CM | POA: Diagnosis not present

## 2017-05-09 DIAGNOSIS — C221 Intrahepatic bile duct carcinoma: Secondary | ICD-10-CM | POA: Diagnosis not present

## 2017-05-30 ENCOUNTER — Other Ambulatory Visit: Payer: Self-pay | Admitting: Internal Medicine

## 2017-06-04 ENCOUNTER — Ambulatory Visit: Payer: 59 | Admitting: Physician Assistant

## 2017-06-19 ENCOUNTER — Inpatient Hospital Stay (HOSPITAL_COMMUNITY)
Admission: EM | Admit: 2017-06-19 | Discharge: 2017-06-23 | DRG: 189 | Disposition: A | Discharge status: Skilled Nursing Facility | Payer: 59 | Attending: Internal Medicine | Admitting: Internal Medicine

## 2017-06-19 ENCOUNTER — Observation Stay (HOSPITAL_COMMUNITY): Payer: 59

## 2017-06-19 ENCOUNTER — Emergency Department (HOSPITAL_COMMUNITY): Payer: 59

## 2017-06-19 ENCOUNTER — Encounter (HOSPITAL_COMMUNITY): Payer: Self-pay | Admitting: Emergency Medicine

## 2017-06-19 DIAGNOSIS — N183 Chronic kidney disease, stage 3 unspecified: Secondary | ICD-10-CM

## 2017-06-19 DIAGNOSIS — E785 Hyperlipidemia, unspecified: Secondary | ICD-10-CM | POA: Diagnosis present

## 2017-06-19 DIAGNOSIS — R0603 Acute respiratory distress: Secondary | ICD-10-CM

## 2017-06-19 DIAGNOSIS — K219 Gastro-esophageal reflux disease without esophagitis: Secondary | ICD-10-CM | POA: Diagnosis present

## 2017-06-19 DIAGNOSIS — R0602 Shortness of breath: Secondary | ICD-10-CM

## 2017-06-19 DIAGNOSIS — Z90710 Acquired absence of both cervix and uterus: Secondary | ICD-10-CM

## 2017-06-19 DIAGNOSIS — Z1624 Resistance to multiple antibiotics: Secondary | ICD-10-CM | POA: Diagnosis present

## 2017-06-19 DIAGNOSIS — Z794 Long term (current) use of insulin: Secondary | ICD-10-CM

## 2017-06-19 DIAGNOSIS — Z8509 Personal history of malignant neoplasm of other digestive organs: Secondary | ICD-10-CM | POA: Diagnosis not present

## 2017-06-19 DIAGNOSIS — J90 Pleural effusion, not elsewhere classified: Secondary | ICD-10-CM | POA: Diagnosis not present

## 2017-06-19 DIAGNOSIS — Z9049 Acquired absence of other specified parts of digestive tract: Secondary | ICD-10-CM

## 2017-06-19 DIAGNOSIS — IMO0002 Reserved for concepts with insufficient information to code with codable children: Secondary | ICD-10-CM | POA: Diagnosis present

## 2017-06-19 DIAGNOSIS — B9620 Unspecified Escherichia coli [E. coli] as the cause of diseases classified elsewhere: Secondary | ICD-10-CM | POA: Diagnosis present

## 2017-06-19 DIAGNOSIS — E782 Mixed hyperlipidemia: Secondary | ICD-10-CM | POA: Diagnosis present

## 2017-06-19 DIAGNOSIS — J9601 Acute respiratory failure with hypoxia: Principal | ICD-10-CM | POA: Diagnosis present

## 2017-06-19 DIAGNOSIS — E871 Hypo-osmolality and hyponatremia: Secondary | ICD-10-CM | POA: Diagnosis present

## 2017-06-19 DIAGNOSIS — E1322 Other specified diabetes mellitus with diabetic chronic kidney disease: Secondary | ICD-10-CM | POA: Diagnosis present

## 2017-06-19 DIAGNOSIS — I252 Old myocardial infarction: Secondary | ICD-10-CM

## 2017-06-19 DIAGNOSIS — E039 Hypothyroidism, unspecified: Secondary | ICD-10-CM | POA: Diagnosis not present

## 2017-06-19 DIAGNOSIS — Z87891 Personal history of nicotine dependence: Secondary | ICD-10-CM

## 2017-06-19 DIAGNOSIS — Z825 Family history of asthma and other chronic lower respiratory diseases: Secondary | ICD-10-CM

## 2017-06-19 DIAGNOSIS — R0600 Dyspnea, unspecified: Secondary | ICD-10-CM

## 2017-06-19 DIAGNOSIS — Z800 Family history of malignant neoplasm of digestive organs: Secondary | ICD-10-CM

## 2017-06-19 DIAGNOSIS — D62 Acute posthemorrhagic anemia: Secondary | ICD-10-CM | POA: Diagnosis present

## 2017-06-19 DIAGNOSIS — Z8709 Personal history of other diseases of the respiratory system: Secondary | ICD-10-CM

## 2017-06-19 DIAGNOSIS — N179 Acute kidney failure, unspecified: Secondary | ICD-10-CM

## 2017-06-19 DIAGNOSIS — I129 Hypertensive chronic kidney disease with stage 1 through stage 4 chronic kidney disease, or unspecified chronic kidney disease: Secondary | ICD-10-CM | POA: Diagnosis present

## 2017-06-19 DIAGNOSIS — J869 Pyothorax without fistula: Secondary | ICD-10-CM | POA: Diagnosis present

## 2017-06-19 DIAGNOSIS — E1122 Type 2 diabetes mellitus with diabetic chronic kidney disease: Secondary | ICD-10-CM | POA: Diagnosis present

## 2017-06-19 DIAGNOSIS — J9691 Respiratory failure, unspecified with hypoxia: Secondary | ICD-10-CM | POA: Diagnosis not present

## 2017-06-19 DIAGNOSIS — Z8249 Family history of ischemic heart disease and other diseases of the circulatory system: Secondary | ICD-10-CM

## 2017-06-19 DIAGNOSIS — E8770 Fluid overload, unspecified: Secondary | ICD-10-CM | POA: Diagnosis not present

## 2017-06-19 DIAGNOSIS — Z9884 Bariatric surgery status: Secondary | ICD-10-CM

## 2017-06-19 DIAGNOSIS — Z8505 Personal history of malignant neoplasm of liver: Secondary | ICD-10-CM

## 2017-06-19 DIAGNOSIS — Z1621 Resistance to vancomycin: Secondary | ICD-10-CM | POA: Diagnosis present

## 2017-06-19 DIAGNOSIS — Z7982 Long term (current) use of aspirin: Secondary | ICD-10-CM

## 2017-06-19 DIAGNOSIS — E1365 Other specified diabetes mellitus with hyperglycemia: Secondary | ICD-10-CM | POA: Diagnosis not present

## 2017-06-19 DIAGNOSIS — J9600 Acute respiratory failure, unspecified whether with hypoxia or hypercapnia: Secondary | ICD-10-CM | POA: Diagnosis present

## 2017-06-19 DIAGNOSIS — I2510 Atherosclerotic heart disease of native coronary artery without angina pectoris: Secondary | ICD-10-CM | POA: Diagnosis present

## 2017-06-19 DIAGNOSIS — R188 Other ascites: Secondary | ICD-10-CM | POA: Diagnosis present

## 2017-06-19 LAB — COMPREHENSIVE METABOLIC PANEL
ALT: 47 U/L (ref 14–54)
ANION GAP: 10 (ref 5–15)
AST: 107 U/L — AB (ref 15–41)
Albumin: 2.4 g/dL — ABNORMAL LOW (ref 3.5–5.0)
Alkaline Phosphatase: 825 U/L — ABNORMAL HIGH (ref 38–126)
BUN: 13 mg/dL (ref 6–20)
CHLORIDE: 97 mmol/L — AB (ref 101–111)
CO2: 24 mmol/L (ref 22–32)
Calcium: 8.3 mg/dL — ABNORMAL LOW (ref 8.9–10.3)
Creatinine, Ser: 1.18 mg/dL — ABNORMAL HIGH (ref 0.44–1.00)
GFR, EST AFRICAN AMERICAN: 56 mL/min — AB (ref >60)
GFR, EST NON AFRICAN AMERICAN: 48 mL/min — AB (ref >60)
Glucose, Bld: 156 mg/dL — ABNORMAL HIGH (ref 65–99)
POTASSIUM: 3.6 mmol/L (ref 3.5–5.1)
Sodium: 131 mmol/L — ABNORMAL LOW (ref 135–145)
TOTAL PROTEIN: 7 g/dL (ref 6.5–8.1)
Total Bilirubin: 1.1 mg/dL (ref 0.3–1.2)

## 2017-06-19 LAB — CBC WITH DIFFERENTIAL/PLATELET
BASOS ABS: 0 10*3/uL (ref 0.0–0.1)
Basophils Relative: 1 %
Eosinophils Absolute: 0 10*3/uL (ref 0.0–0.7)
Eosinophils Relative: 1 %
HEMATOCRIT: 27.8 % — AB (ref 36.0–46.0)
HEMOGLOBIN: 9.4 g/dL — AB (ref 12.0–15.0)
LYMPHS ABS: 1.3 10*3/uL (ref 0.7–4.0)
LYMPHS PCT: 21 %
MCH: 29.7 pg (ref 26.0–34.0)
MCHC: 33.8 g/dL (ref 30.0–36.0)
MCV: 87.7 fL (ref 78.0–100.0)
Monocytes Absolute: 0.9 10*3/uL (ref 0.1–1.0)
Monocytes Relative: 15 %
NEUTROS ABS: 3.8 10*3/uL (ref 1.7–7.7)
NEUTROS PCT: 63 %
Platelets: 104 10*3/uL — ABNORMAL LOW (ref 150–400)
RBC: 3.17 MIL/uL — AB (ref 3.87–5.11)
RDW: 19.5 % — ABNORMAL HIGH (ref 11.5–15.5)
WBC: 6 10*3/uL (ref 4.0–10.5)

## 2017-06-19 LAB — MRSA PCR SCREENING: MRSA by PCR: NEGATIVE

## 2017-06-19 LAB — I-STAT TROPONIN, ED: Troponin i, poc: 0 ng/mL (ref 0.00–0.08)

## 2017-06-19 LAB — BRAIN NATRIURETIC PEPTIDE: B Natriuretic Peptide: 94.7 pg/mL (ref 0.0–100.0)

## 2017-06-19 MED ORDER — ATORVASTATIN CALCIUM 20 MG PO TABS
20.0000 mg | ORAL_TABLET | Freq: Every day | ORAL | Status: DC
  Administered 2017-06-19 – 2017-06-22 (×4): 20 mg via ORAL
  Filled 2017-06-19 (×4): qty 1

## 2017-06-19 MED ORDER — SUCRALFATE 1 G PO TABS
1.0000 g | ORAL_TABLET | Freq: Four times a day (QID) | ORAL | Status: DC
  Administered 2017-06-19 – 2017-06-23 (×15): 1 g via ORAL
  Filled 2017-06-19 (×15): qty 1

## 2017-06-19 MED ORDER — SALINE SPRAY 0.65 % NA SOLN
1.0000 | NASAL | Status: DC | PRN
  Filled 2017-06-19: qty 44

## 2017-06-19 MED ORDER — OXYCODONE HCL 5 MG PO TABS
5.0000 mg | ORAL_TABLET | ORAL | Status: DC | PRN
  Filled 2017-06-19: qty 1

## 2017-06-19 MED ORDER — LEVOTHYROXINE SODIUM 100 MCG PO TABS
100.0000 ug | ORAL_TABLET | ORAL | Status: DC
  Administered 2017-06-20 – 2017-06-22 (×2): 100 ug via ORAL
  Filled 2017-06-19 (×2): qty 1

## 2017-06-19 MED ORDER — SODIUM CHLORIDE 0.9% FLUSH: 10.0000 mL | INTRAVENOUS | Status: DC | PRN

## 2017-06-19 MED ORDER — INSULIN GLARGINE 100 UNIT/ML ~~LOC~~ SOLN
12.0000 [IU] | Freq: Every day | SUBCUTANEOUS | Status: DC
  Administered 2017-06-20 – 2017-06-23 (×4): 12 [IU] via SUBCUTANEOUS
  Filled 2017-06-19 (×7): qty 0.12

## 2017-06-19 MED ORDER — SODIUM CHLORIDE 0.9 % IV SOLN
100.0000 mg | INTRAVENOUS | Status: DC
  Administered 2017-06-19 – 2017-06-22 (×4): 100 mg via INTRAVENOUS
  Filled 2017-06-19 (×5): qty 100

## 2017-06-19 MED ORDER — MICAFUNGIN SODIUM 100 MG IV SOLR
100.0000 mg | Freq: Every day | INTRAVENOUS | Status: DC
  Filled 2017-06-19: qty 100

## 2017-06-19 MED ORDER — FLUTICASONE PROPIONATE 50 MCG/ACT NA SUSP
2.0000 | Freq: Every day | NASAL | Status: DC
  Administered 2017-06-20 – 2017-06-23 (×4): 2 via NASAL
  Filled 2017-06-19: qty 16

## 2017-06-19 MED ORDER — FERROUS SULFATE 325 (65 FE) MG PO TABS
325.0000 mg | ORAL_TABLET | Freq: Every day | ORAL | Status: DC
  Administered 2017-06-20 – 2017-06-23 (×4): 325 mg via ORAL
  Filled 2017-06-19 (×4): qty 1

## 2017-06-19 MED ORDER — LEVOTHYROXINE SODIUM 100 MCG PO TABS
200.0000 ug | ORAL_TABLET | ORAL | Status: DC
  Administered 2017-06-21 – 2017-06-23 (×2): 200 ug via ORAL
  Filled 2017-06-19 (×2): qty 2

## 2017-06-19 MED ORDER — PANCRELIPASE (LIP-PROT-AMYL) 12000-38000 UNITS PO CPEP
12000.0000 [IU] | ORAL_CAPSULE | Freq: Three times a day (TID) | ORAL | Status: DC
  Administered 2017-06-19 – 2017-06-23 (×11): 12000 [IU] via ORAL
  Filled 2017-06-19 (×11): qty 1

## 2017-06-19 MED ORDER — ERTAPENEM IV (FOR PTA / DISCHARGE USE ONLY): 1.0000 g | INTRAVENOUS | Status: DC

## 2017-06-19 MED ORDER — PANTOPRAZOLE SODIUM 40 MG PO TBEC
40.0000 mg | DELAYED_RELEASE_TABLET | Freq: Every day | ORAL | Status: DC
  Administered 2017-06-19 – 2017-06-23 (×5): 40 mg via ORAL
  Filled 2017-06-19 (×5): qty 1

## 2017-06-19 MED ORDER — FUROSEMIDE 10 MG/ML IJ SOLN
40.0000 mg | Freq: Once | INTRAMUSCULAR | Status: AC
  Administered 2017-06-19: 40 mg via INTRAVENOUS
  Filled 2017-06-19: qty 4

## 2017-06-19 MED ORDER — IOPAMIDOL (ISOVUE-300) INJECTION 61%
INTRAVENOUS | Status: AC
  Administered 2017-06-19: 75 mL
  Filled 2017-06-19: qty 75

## 2017-06-19 MED ORDER — SODIUM CHLORIDE 0.9 % IV SOLN
1.0000 g | INTRAVENOUS | Status: DC
  Administered 2017-06-19 – 2017-06-22 (×4): 1 g via INTRAVENOUS
  Filled 2017-06-19 (×5): qty 1

## 2017-06-19 MED ORDER — ALPRAZOLAM 0.5 MG PO TABS
1.0000 mg | ORAL_TABLET | Freq: Once | ORAL | Status: AC
  Administered 2017-06-19: 1 mg via ORAL
  Filled 2017-06-19: qty 2

## 2017-06-19 MED ORDER — FUROSEMIDE 40 MG PO TABS
40.0000 mg | ORAL_TABLET | Freq: Two times a day (BID) | ORAL | Status: DC
  Administered 2017-06-20 – 2017-06-23 (×7): 40 mg via ORAL
  Filled 2017-06-19 (×7): qty 1

## 2017-06-19 MED ORDER — NITROGLYCERIN 0.4 MG SL SUBL: 0.4000 mg | SUBLINGUAL_TABLET | SUBLINGUAL | Status: DC | PRN

## 2017-06-19 MED ORDER — LINEZOLID 600 MG PO TABS
600.0000 mg | ORAL_TABLET | Freq: Two times a day (BID) | ORAL | Status: DC
  Administered 2017-06-19 – 2017-06-23 (×8): 600 mg via ORAL
  Filled 2017-06-19 (×10): qty 1

## 2017-06-19 MED ORDER — SODIUM CHLORIDE 0.9% FLUSH
10.0000 mL | Freq: Two times a day (BID) | INTRAVENOUS | Status: DC
  Administered 2017-06-19 – 2017-06-20 (×2): 10 mL
  Administered 2017-06-20: 20 mL
  Administered 2017-06-21 – 2017-06-23 (×4): 10 mL

## 2017-06-19 NOTE — H&P (Signed)
History and Physical    DERENDA Stevens OAC:[id number] DOB: Mar 06, 1956 DOA: 06/19/2017  PCP: Unk Pinto, MD  Patient coming from: SNF.   I have personally briefly reviewed patient's old medical records in [place]  Chief Complaint: sob.   HPI: Courtney Stevens is a 61 y.o. 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 On talking to the patient and family at bedside, they report that she has been feeling sob since she was discharged on Monday. She denies any chest pain , fever or chills. Nausea, vomiting diarrhea. Or abdominal pain. CXR in ED, revealed rigth lower lobe infiltrate and upper lobe infiltrate/ opacity. She was referred to medical service for further evaluation.    Review of Systems: As per HPI otherwise 10 point review of systems negative.    Past Medical History:  Diagnosis Date  . Anemia    History of blood transfusion which is when pt reports being exposed to hepatitis in 2009.  Marland Kitchen Anxiety   . Asthma   . CAD (coronary artery disease)    a. 03/2016 St Echo: nl EF, no wma-->Nl study;  b. 11/13/16 NSTEMI/Cath: EF 55-65%, 40% prox RCA, 20% prox-mid LAD, 90% D2 lesion. The culprit for the NSTEMI appeared to be the 90% ostial stenosis in second diagonal which is a 2 mm in diameter and supplies a relatively small territory. Managed medically.  . Cancer ([place])    [place] bile duct ca (2009)   . CKD (chronic kidney disease), stage III   . Diabetes mellitus   . GERD (gastroesophageal reflux disease)   . H/O gastric bypass   . Hemorrhoid   . Hepatitis   . Hyperlipidemia   . Hypertension   . Hypothyroid   . Liver tumor   . PVC's (premature ventricular  contractions)   . Q fever 2015    Past Surgical History:  Procedure Laterality Date  . ABDOMINAL HYSTERECTOMY  1990   BIL OOPHORECTOMY  . APPENDECTOMY    . Bile Duct Resection  2009   [place]  . BREAST SURGERY     BIOPSY NEG  . BUNIONECTOMY Right 2005  . CARDIAC CATHETERIZATION N/A 11/13/2016   Procedure: Left Heart Cath and Coronary Angiography;  Surgeon: Wellington Hampshire, MD;  Location: [facility];  Service: Cardiovascular;  Laterality: N/A;  . CHOLECYSTECTOMY  1992  . GASTRIC BYPASS  2004  . TONSILLECTOMY    . TUBAL LIGATION    . VIDEO BRONCHOSCOPY WITH ENDOBRONCHIAL ULTRASOUND N/A 10/12/2014   Procedure: VIDEO BRONCHOSCOPY WITH ENDOBRONCHIAL ULTRASOUND;  Surgeon: Brand Males, MD;  Location: [facility];  Service: Thoracic;  Laterality: N/A;     reports that she quit smoking about 39 years ago. Her smoking use included Cigarettes. She has a 10.00 pack-year smoking history. She has never used smokeless tobacco. She reports that she drinks about 0.6 oz of alcohol per week . She reports that she does not use drugs.  Allergies  Allergen Reactions  . Ace Inhibitors Anaphylaxis  . Tilade [Nedocromil] Anaphylaxis  . Reglan [Metoclopramide] Nausea And Vomiting  . Betadine [Povidone Iodine] Rash    iching     Family History  Problem Relation Age of Onset  .  Colon cancer Mother   . Hypertension Mother   . Glaucoma Mother   . Stroke Mother   . Diabetes Father   . Alzheimer's disease Father   . Heart attack Father 71  . Nephrolithiasis Father   . Emphysema Father        was a smoker  . Heart disease Father   . Nephrolithiasis Sister   . Heart attack Brother 13  . Nephrolithiasis Brother   . Neuropathy Neg Hx    Family history reviewed.   Prior to Admission medications   Medication Sig Start Date End Date Taking? Authorizing Provider  aspirin EC 81 MG EC tablet Take 1 tablet (81 mg total) by mouth daily. 11/15/16  Yes Dunn, Dayna N, PA-C  atorvastatin (LIPITOR)  20 MG tablet Take 20 mg by mouth at bedtime.   Yes [provider]  cetirizine (ZYRTEC) 10 MG tablet Take 10 mg by mouth daily.    Yes [provider]  CREON 12000 units CPEP capsule TAKE 1 CAPSULE THREE TIMES A DAY Patient taking differently: TAKE 1 CAPSULE (12000 UNITS) BY MOUTH THREE TIMES A DAY 02/10/17  Yes Unk Pinto, MD  ertapenem [facility]) IVPB Inject 1 g into the vein daily. 10 day course started 06/17/17   Yes [provider]  ferrous sulfate 325 (65 FE) MG tablet Take 325 mg by mouth daily.   Yes [provider]  fluticasone (FLONASE) 50 MCG/ACT nasal spray PLACE 2 SPRAYS INTO BOTH NOSTRILS DAILY. Patient taking differently: Place 2 sprays into both nostrils daily.  04/25/17  Yes Unk Pinto, MD  furosemide (LASIX) 40 MG tablet TAKE 1 TO 2 TABLETS TWICE A DAY FOR FLUID AND SWELLING Patient taking differently: TAKE  1 TABLET (40 MG) TWICE A DAY (9AM AND 5PM) FOR FLUID AND SWELLING 02/10/17  Yes Unk Pinto, MD  gabapentin (NEURONTIN) 100 MG capsule TAKE 1-3 CAPSULES BY MOUTH DAILY FOR NEURITIS PAIN Patient taking differently: TAKE 1 CAPSULE (100 MG) BY MOUTH TWICE DAILY (9AM AND 5PM) FOR NEURITIS PAIN 05/07/17  Yes Unk Pinto, MD  glucagon (GLUCAGON EMERGENCY) 1 MG injection Inject 1 mg into the muscle once as needed (hypoglycemia).   Yes [provider]  insulin glargine (LANTUS) 100 unit/mL SOPN Inject 15 units daily or as directed. Patient taking differently: Inject 12 Units into the skin daily.  04/25/17  Yes Unk Pinto, MD  insulin lispro (HUMALOG) 100 UNIT/ML injection Inject 0-10 Units into the skin See admin instructions. Inject 0-10 units subcutaneously before meals and at bedtime per sliding scale: CBG 0-150 0 units, 151-200 2 units, 201-250 4 units 251-300 6 units, 301-350 8 units, 351-400 10 units, >401 call MD   Yes [provider]  levothyroxine (SYNTHROID, LEVOTHROID) 100 MCG tablet Take 100-200 mcg by  mouth See admin instructions. Take 1 tablet (100 mcg) by mouth every other day alternating with 2 tablets (200 mcg) on other days   Yes [provider]  micafungin (MYCAMINE) 100 MG SOLR injection Inject 50 mg into the vein daily.   Yes [provider]  Multiple Vitamins-Minerals (MULTIVITAMIN WITH MINERALS) tablet Take 1 tablet by mouth daily.    Yes [provider]  nitroGLYCERIN (NITROSTAT) 0.4 MG SL tablet Place 1 tablet (0.4 mg total) under the tongue every 5 (five) minutes as needed for chest pain. Up to 3 doses Patient taking differently: Place 0.4 mg under the tongue every 5 (five) minutes as needed for chest pain (up to 3 doses).  11/14/16  Yes Dunn, Dayna N, PA-C  oxyCODONE (OXY IR/ROXICODONE) 5 MG immediate release tablet Take 5-10 mg by mouth every 4 (four) hours as needed (1 tablet pain 3-5/ 2 tablets pain 5-10).   Yes [provider]  pantoprazole (PROTONIX) 40 MG tablet TAKE 1 TABLET DAILY Patient taking differently: TAKE 1 TABLET (40 MG BY MOUTH DAILY 02/10/17  Yes Unk Pinto, MD  sodium chloride (OCEAN) 0.65 % SOLN nasal spray Place 1 spray into both nostrils every 2 (two) hours as needed for congestion.   Yes [provider]  sucralfate (CARAFATE) 1 g tablet TAKE 1 TABLET FOUR TIMES A DAY Patient taking differently: TAKE 1 TABLET (1 G) BY MOUTH FOUR TIMES A DAY - 9AM, 1PM, 5PM, 9PM 02/10/17  Yes Unk Pinto, MD  Vitamin D, Ergocalciferol, (DRISDOL) 50000 units CAPS capsule TAKE 1 CAPSULE DAILY Patient taking differently: TAKE 1 CAPSULE (50,000 UNITS) BY MOUTH DAILY 04/25/17  Yes Unk Pinto, MD  albuterol (VENTOLIN HFA) 108 (90 BASE) MCG/ACT inhaler Inhale 2 puffs into the lungs every 4 (four) hours as needed for wheezing or shortness of breath. Patient not taking: Reported on 06/19/2017 09/08/15   Vicie Mutters, PA-C  ALPRAZolam Duanne Moron) 1 MG tablet 1/2 to 1 tablet 3 x / day if needed for Anxiety Patient not taking: Reported  on 06/19/2017 05/02/17 11/01/17  Unk Pinto, MD  atorvastatin (LIPITOR) 40 MG tablet Take 1 tablet ( 20 mg ) daily Patient not taking: Reported on 06/19/2017 04/25/17   Unk Pinto, MD  Blood Glucose Monitoring Suppl (ONE TOUCH ULTRA SYSTEM KIT) w/Device KIT 1 kit by Does not apply route once. 01/08/16   Unk Pinto, MD  calcium citrate-vitamin D (CITRACAL+D) 315-200 MG-UNIT per tablet Take 1 tablet by mouth 3 (three) times daily.    [provider]  cyclobenzaprine (FLEXERIL) 10 MG tablet TAKE 1/2 TO 1 TABLET BY MOUTH 3 TIMES A DAY AS NEEDED FOR MUSCLE SPASMS Patient not taking: Reported on 06/19/2017 05/30/17   Unk Pinto, MD  FREESTYLE LITE test strip USE TO CHECK BLOOD SUGAR 3 TIMES A DAY DUE TO POORLY CONTROLLED DIABETES. DX E11.65 02/21/17   Unk Pinto, MD  glipiZIDE (GLUCOTROL) 5 MG tablet TAKE ONE-HALF (1/2) TO ONE TABLET THREE TIMES A DAY IF NEEDED FOR ELEVATED GLUCOSE Patient not taking: Reported on 06/19/2017 02/10/17   Unk Pinto, MD  Insulin Pen Needle 32G X 4 MM MISC Inject insulin 1-3 times daily SQ 02/05/17   Vicie Mutters, PA-C  isosorbide mononitrate (IMDUR) 60 MG 24 hr tablet Take 1 tablet (60 mg total) by mouth daily. Patient not taking: Reported on 06/19/2017 03/19/17 03/14/18  Martinique, Peter M, MD  Lancets [facility] ULTRASOFT) lancets Check blood sugar 3 times daily due to poorly controlled glucose-DX-E11.65. 01/08/16   Unk Pinto, MD  levothyroxine (SYNTHROID, LEVOTHROID) 200 MCG tablet TAKE ONE-HALF (1/2) TO ONE TABLET DAILY BEFORE BREAKFAST ALTERNATE TAKING 100 MCG AND 200 MCG Patient not taking: Reported on 06/19/2017 02/10/17   Unk Pinto, MD  linezolid (ZYVOX) 600 MG tablet Take 600 mg by mouth every 12 (twelve) hours. 10 day course ordered 06/16/17    [provider]  metFORMIN (GLUCOPHAGE XR) 500 MG 24 hr tablet Take 4 tablets daily for Diabetes Patient not taking: Reported on 06/19/2017 03/03/17 10/03/17  Unk Pinto, MD    metoCLOPramide (REGLAN) 10 MG tablet Take 1 tablet at bedtime for Reflux Patient not taking: Reported on 06/19/2017 05/06/17 08/06/17  Unk Pinto, MD  metoprolol tartrate (LOPRESSOR) 25 MG tablet Take  1 tablet (25 mg total) by mouth 2 (two) times daily. Patient not taking: Reported on 06/19/2017 04/25/17   Unk Pinto, MD  triamcinolone cream (KENALOG) 0.1 % Apply 1 application topically 2 (two) times daily. Patient not taking: Reported on 06/19/2017 03/31/17   Unk Pinto, MD    Physical Exam: Vitals:   06/19/17 1605 06/19/17 1645 06/19/17 1815 06/19/17 1830  BP:  129/69 123/65 120/66  Pulse:  (!) 104 (!) 102 (!) 101  Resp:  (!) 29 (!) 24 20  Temp:      TempSrc:      SpO2: 100% 99% 100% 100%  Weight:      Height:        Constitutional: NAD, calm, comfortable Vitals:   06/19/17 1605 06/19/17 1645 06/19/17 1815 06/19/17 1830  BP:  129/69 123/65 120/66  Pulse:  (!) 104 (!) 102 (!) 101  Resp:  (!) 29 (!) 24 20  Temp:      TempSrc:      SpO2: 100% 99% 100% 100%  Weight:      Height:       Eyes: PERRL, lids and conjunctivae normal ENMT: dry mucous membranes.  Neck: normal, supple, no masses, no thyromegaly Respiratory: diminished air entry at the right lower lobe, air entry fair on the left side.  Cardiovascular: Regular rate and rhythm, no murmurs / rubs / gallops. No extremity edema. 2+ pedal pulses. No carotid bruits.  Abdomen: no tenderness, no masses palpated. No hepatosplenomegaly. Bowel sounds positive.  Musculoskeletal:  2+ edema. Able to move all extremities.   Skin: no rashes, lesions, ulcers. No induration Neurologic: CN 2-12 grossly intact. Sensation intact, DTR normal. Strength 5/5 in all 4.  Psychiatric: Normal judgment and insight. Alert and oriented x 3. Normal mood.     Labs on Admission: I have personally reviewed following labs and imaging studies  CBC:  Recent Labs Lab 06/19/17 1306  WBC 6.0  NEUTROABS 3.8  HGB 9.4*  HCT 27.8*  MCV 87.7   PLT 629*   Basic Metabolic Panel:  Recent Labs Lab 06/19/17 1306  NA 131*  K 3.6  CL 97*  CO2 24  GLUCOSE 156*  BUN 13  CREATININE 1.18*  CALCIUM 8.3*   GFR: Estimated Creatinine Clearance: 47.2 mL/min (A) (by C-G formula based on SCr of 1.18 mg/dL (H)). Liver Function Tests:  Recent Labs Lab 06/19/17 1306  AST 107*  ALT 47  ALKPHOS 825*  BILITOT 1.1  PROT 7.0  ALBUMIN 2.4*   No results for input(s): LIPASE, AMYLASE in the last 168 hours. No results for input(s): AMMONIA in the last 168 hours. Coagulation Profile: No results for input(s): INR, PROTIME in the last 168 hours. Cardiac Enzymes: No results for input(s): CKTOTAL, CKMB, CKMBINDEX, TROPONINI in the last 168 hours. BNP (last 3 results) No results for input(s): PROBNP in the last 8760 hours. HbA1C: No results for input(s): HGBA1C in the last 72 hours. CBG: No results for input(s): GLUCAP in the last 168 hours. Lipid Profile: No results for input(s): CHOL, HDL, LDLCALC, TRIG, CHOLHDL, LDLDIRECT in the last 72 hours. Thyroid Function Tests: No results for input(s): TSH, T4TOTAL, FREET4, T3FREE, THYROIDAB in the last 72 hours. Anemia Panel: No results for input(s): VITAMINB12, FOLATE, FERRITIN, TIBC, IRON, RETICCTPCT in the last 72 hours. Urine analysis:    Component Value Date/Time   COLORURINE YELLOW 03/03/2017 1149   APPEARANCEUR CLEAR 03/03/2017 1149   LABSPEC 1.009 03/03/2017 1149   LABSPEC 1.020 08/15/2009 1348  PHURINE 6.0 03/03/2017 1149   GLUCOSEU 2+ (A) 03/03/2017 1149   HGBUR NEGATIVE 03/03/2017 1149   BILIRUBINUR NEGATIVE 03/03/2017 1149   BILIRUBINUR Negative 08/15/2009 1348   KETONESUR NEGATIVE 03/03/2017 1149   PROTEINUR NEGATIVE 03/03/2017 1149   UROBILINOGEN 0.2 08/02/2014 1616   NITRITE NEGATIVE 03/03/2017 1149   LEUKOCYTESUR NEGATIVE 03/03/2017 1149   LEUKOCYTESUR Negative 08/15/2009 1348    Radiological Exams on Admission: Ct Chest W Contrast  Result Date:  06/19/2017 CLINICAL DATA:  Shortness of breath and acute respiratory illness. EXAM: CT CHEST WITH CONTRAST TECHNIQUE: Multidetector CT imaging of the chest was performed during intravenous contrast administration. CONTRAST:  <See Chart> ISOVUE-300 IOPAMIDOL (ISOVUE-300) INJECTION 61% COMPARISON:  05/09/2017 FINDINGS: Cardiovascular: Heart size is upper normal. No pericardial effusion. Coronary artery calcification is noted. Atherosclerotic calcification is noted in the wall of the thoracic aorta. Mediastinum/Nodes: New mild right supraclavicular lymphadenopathy is identified with 9 mm short axis lymph node visible on image 20 of series 3. 12 mm short axis high right paratracheal lymph node is seen on image 28 of series 3. 13 mm subcarinal short axis lymph node was 9 mm previously. No left hilar lymphadenopathy. No discrete lymphadenopathy in the right hilum. There is no axillary lymphadenopathy. Lungs/Pleura: Small right loculated pleural fluid collection evident with gas distributed throughout the fluid/debris in the right pleural space. A right-sided chest tube is identified in the posterior right pleural space. Small layering left pleural effusion evident. There is bibasilar collapse/ consolidation, right greater than left. 3 cm focus of ground-glass attenuation identified in the peripheral left upper lobe. Upper Abdomen: Pneumobilia again noted. Surgical line along the posterior right liver compatible with right hepatectomy there is gas along the resection margin of the liver which appears to be subdiaphragmatic. Surgical changes evident in the proximal stomach. Scarring in the right kidney is incompletely visualized. Small to moderate volume ascites evident. Musculoskeletal: Similar appearance of scattered sclerotic foci in the right humerus and scapula. IMPRESSION: 1. Status post right hepatectomy. There is debris and gas along the resection margin of the liver which appears to be subdiaphragmatic and raises  concern for abscess. 2. Right-sided chest tube with complex fluid/debris and gas in the right pleural space. Empyema would be a consideration. 3. Right supraclavicular lymphadenopathy with mild mediastinal lymphadenopathy. 4. Bibasilar collapse/consolidation, right greater than left with ground-glass attenuation peripheral left upper lobe suggesting infection/inflammation. Electronically Signed   By: Misty Stanley M.D.   On: 06/19/2017 17:25   Dg Chest Port 1 View  Result Date: 06/19/2017 CLINICAL DATA:  SOB/midline lower CP/upper abd pain x today, cough (productive) x this am, HTN, diabetic, past smoker - quit x 40 years ago EXAM: PORTABLE CHEST 1 VIEW COMPARISON:  Chest x-rays dated 12/16/2016 and 11/11/2016. Chest CT dated 05/09/2017. FINDINGS: Right-sided chest tube now in place with tip directed towards the medial aspects of the right upper lobe. Dense opacity at the right lung base, pleural effusion and/or airspace collapse. Suspect extension of pleural fluid to the right lung apex, versus hydropneumothorax. Additional lesser opacity at the left lung base, also most likely pleural effusion and/or atelectasis. Heart size and mediastinal contours appear stable. Left-sided PICC line in place with tip at the level of the upper SVC. IMPRESSION: 1. Dense opacity at the right lung base, most likely combination of pleural effusion and airspace collapse. Underlying pneumonia cannot be excluded. 2. Additional opacity at the right lung apex, most likely extension of pleural effusion, alternatively apical hydropneumothorax. Recommend short-term follow-up chest x-ray,  either later today or in the morning, to ensure stability or resolution. 3. Additional opacity at the left lung base, most likely combination of atelectasis and small pleural effusion. 4. Right-sided chest tube in place. 5. Left-sided PICC line in place with tip at the level of the upper SVC. Electronically Signed   By: Franki Cabot M.D.   On: 06/19/2017  13:26    EKG: Independently reviewed.   Assessment/Plan Active Problems:   Acute respiratory failure (HCC)    Acute respiratory failure with hypoxia secondary to right sided empyema, s/p chest tube placement draining sero sanguinous fluid along with some pus, slightly worsened with the choking episode earlier today.  Currently requiring 2 lit of [place] oxygen to keep sats greater than 95%. No chest pain or fevers or chills.  On micafungin, linezolid and  ertepenam for the VRE and MDR  Ecoli in the lung. Plan to complete the antibiotics by 8/7 as per the notes from [place].  She is afebrile and wbc count is normal.  All her care is at [facility].  Int he next 24 hour hours we will try to wean her off  The oxygen and plan to d/c back to facility.  Will get wound care consult for the  Post op wound dressing recommendations.   Hypothyroidism:  Resume synthroid.   Anasarca:  Diuresis with  lasix.   DVT prophylaxis: lovenox.  Code Status:  (Full/Partial (specify details) Family Communication:  Discussed with family at bedside.  Disposition Plan: pending furthere valuation. Possibly back to SNF in 24 hours.  Consults called: none.  Admission status:*obs/ SDU   Levaughn Puccinelli MD [facility] Pager [phone]  If 7PM-7AM, please contact night-coverage [email or web address] Password [facility]  06/19/2017, 6:52 PM

## 2017-06-19 NOTE — ED Provider Notes (Signed)
[facility] DEPT Provider Note   CSN: [id number] Arrival date & time: 06/19/17  1236     History   Chief Complaint Chief Complaint  Patient presents with  . Shortness of Breath    HPI Courtney Stevens is a 61 y.o. female.  Patient is a 61 year old female with history of cholangiocarcinoma status post hepatectomy with resultant fluid collections in the abdomen and right pleura. She currently has drains in place. She presents today for evaluation of shortness of breath from an extended care facility where she is staying for rehabilitation. She tells me this morning she "choked on a piece of bread", then as the day went on and to feel more and more short of breath. She denies any fevers or chills. She denies any productive cough. She does report swelling of both legs, however does have a history of edema.   The history is provided by the patient.  Shortness of Breath  This is a new problem. The problem occurs continuously.Episode onset: This morning. The problem has been gradually worsening. Pertinent negatives include no fever and no sputum production. She has tried nothing for the symptoms. The treatment provided no relief.    Past Medical History:  Diagnosis Date  . Anemia    History of blood transfusion which is when pt reports being exposed to hepatitis in 2009.  [place] Anxiety   . Asthma   . CAD (coronary artery disease)    a. 03/2016 St Echo: nl EF, no wma-->Nl study;  b. 11/13/16 NSTEMI/Cath: EF 55-65%, 40% prox RCA, 20% prox-mid LAD, 90% D2 lesion. The culprit for the NSTEMI appeared to be the 90% ostial stenosis in second diagonal which is a 2 mm in diameter and supplies a relatively small territory. Managed medically.  . Cancer ([place])    [place] bile duct ca (2009)   . CKD (chronic kidney disease), stage III   . Diabetes mellitus   . GERD (gastroesophageal reflux disease)   . H/O gastric bypass   . Hemorrhoid   . Hepatitis   . Hyperlipidemia   . Hypertension   .  Hypothyroid   . Liver tumor   . PVC's (premature ventricular contractions)   . Q fever 2015    Patient Active Problem List   Diagnosis Date Noted  . Uncontrolled secondary diabetes mellitus with stage 3 CKD (GFR 30-59) ([place]) 12/30/2016  . CKD (chronic kidney disease), stage III 11/14/2016  . CAD in native artery 11/14/2016  . PVC's (premature ventricular contractions) 11/14/2016  . NSTEMI (non-ST elevated myocardial infarction) ([place]) 11/12/2016  . Hypokalemia 11/12/2016  . Chest pain with moderate risk for cardiac etiology 03/25/2016  . BMI 25.0-25.9,adult 10/16/2015  . GERD 07/23/2015  . Vitamin D deficiency 03/24/2014  . Medication management 03/24/2014  . 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 Hypothyroidism 11/25/2013  . Essential hypertension 11/03/2013  . Hyperlipidemia 11/03/2013  . 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 Multiple pulmonary nodules 03/06/2013  . Personal history of colonic polyps 03/03/2013  . 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 Type II diabetes mellitus with renal manifestations  04/03/2010  . PANCREATIC INSUFFICIENCY 04/03/2010    Past Surgical History:  Procedure Laterality Date  . ABDOMINAL HYSTERECTOMY  1990   BIL OOPHORECTOMY  . APPENDECTOMY    . Bile Duct Resection  2009   [facility]  . BREAST SURGERY     BIOPSY NEG  . BUNIONECTOMY Right 2005  . CARDIAC CATHETERIZATION N/A  11/13/2016   Procedure: Left Heart Cath and Coronary Angiography;  Surgeon: Wellington Hampshire, MD;  Location: [facility];  Service: Cardiovascular;  Laterality: N/A;  . CHOLECYSTECTOMY  1992  . GASTRIC BYPASS  2004  . TONSILLECTOMY    . TUBAL LIGATION    . VIDEO BRONCHOSCOPY WITH ENDOBRONCHIAL ULTRASOUND N/A 10/12/2014   Procedure: VIDEO BRONCHOSCOPY WITH ENDOBRONCHIAL ULTRASOUND;  Surgeon: Brand Males, MD;  Location: [facility] OR;  Service: Thoracic;  Laterality: N/A;    OB History    No  data available       Home Medications    Prior to Admission medications   Medication Sig Start Date End Date Taking? Authorizing Provider  albuterol (VENTOLIN HFA) 108 (90 BASE) MCG/ACT inhaler Inhale 2 puffs into the lungs every 4 (four) hours as needed for wheezing or shortness of breath. 09/08/15   Vicie Mutters, PA-C  ALPRAZolam Duanne Moron) 1 MG tablet 1/2 to 1 tablet 3 x / day if needed for Anxiety 05/02/17 11/01/17  Unk Pinto, MD  antiseptic oral rinse (BIOTENE) LIQD 15 mLs by Mouth Rinse route as needed for dry mouth.    [provider]  aspirin EC 81 MG EC tablet Take 1 tablet (81 mg total) by mouth daily. 11/15/16   Dunn, Nedra Hai, PA-C  atorvastatin (LIPITOR) 40 MG tablet Take 1 tablet ( 20 mg ) daily 04/25/17   Unk Pinto, MD  Blood Glucose Monitoring Suppl (ONE TOUCH ULTRA SYSTEM KIT) w/Device KIT 1 kit by Does not apply route once. 01/08/16   Unk Pinto, MD  calcium citrate-vitamin D (CITRACAL+D) 315-200 MG-UNIT per tablet Take 1 tablet by mouth 3 (three) times daily.    [provider]  cetirizine (ZYRTEC) 10 MG tablet Take 10 mg by mouth every morning.      [provider]  CREON 12000 units CPEP capsule TAKE 1 CAPSULE THREE TIMES A DAY 02/10/17   Unk Pinto, MD  cyclobenzaprine (FLEXERIL) 10 MG tablet TAKE 1/2 TO 1 TABLET BY MOUTH 3 TIMES A DAY AS NEEDED FOR MUSCLE SPASMS 05/30/17   Unk Pinto, MD  ergocalciferol (VITAMIN D2) 50000 units capsule Take 50,000 Units by mouth daily. DOES NOT TAKE ON SUNDAYS    [provider]  fluticasone (FLONASE) 50 MCG/ACT nasal spray PLACE 2 SPRAYS INTO BOTH NOSTRILS DAILY. 04/25/17   Unk Pinto, MD  FREESTYLE LITE test strip USE TO CHECK BLOOD SUGAR 3 TIMES A DAY DUE TO POORLY CONTROLLED DIABETES. DX E11.65 02/21/17   Unk Pinto, MD  furosemide (LASIX) 40 MG tablet TAKE 1 TO 2 TABLETS TWICE A DAY FOR FLUID AND SWELLING 02/10/17   Unk Pinto, MD  gabapentin (NEURONTIN) 100  MG capsule TAKE 1-3 CAPSULES BY MOUTH DAILY FOR NEURITIS PAIN 05/07/17   Unk Pinto, MD  glipiZIDE (GLUCOTROL) 5 MG tablet TAKE ONE-HALF (1/2) TO ONE TABLET THREE TIMES A DAY IF NEEDED FOR ELEVATED GLUCOSE 02/10/17   Unk Pinto, MD  insulin glargine (LANTUS) 100 unit/mL SOPN Inject 15 units daily or as directed. 04/25/17   Unk Pinto, MD  Insulin Pen Needle 32G X 4 MM MISC Inject insulin 1-3 times daily SQ 02/05/17   Vicie Mutters, PA-C  isosorbide mononitrate (IMDUR) 60 MG 24 hr tablet Take 1 tablet (60 mg total) by mouth daily. 03/19/17 03/14/18  Martinique, Peter M, MD  Lancets [facility] ULTRASOFT) lancets Check blood sugar 3 times daily due to poorly controlled glucose-DX-E11.65. 01/08/16   Unk Pinto, MD  levothyroxine (SYNTHROID, LEVOTHROID) 200 MCG tablet  TAKE ONE-HALF (1/2) TO ONE TABLET DAILY BEFORE BREAKFAST ALTERNATE TAKING 100 MCG AND 200 MCG 02/10/17   Unk Pinto, MD  metFORMIN (GLUCOPHAGE XR) 500 MG 24 hr tablet Take 4 tablets daily for Diabetes 03/03/17 10/03/17  Unk Pinto, MD  metoCLOPramide (REGLAN) 10 MG tablet Take 1 tablet at bedtime for Reflux 05/06/17 08/06/17  Unk Pinto, MD  metoprolol tartrate (LOPRESSOR) 25 MG tablet Take 1 tablet (25 mg total) by mouth 2 (two) times daily. 04/25/17   Unk Pinto, MD  Multiple Vitamins-Minerals (MULTIVITAMIN WITH MINERALS) tablet Take 1 tablet by mouth daily.     [provider]  nitroGLYCERIN (NITROSTAT) 0.4 MG SL tablet Place 1 tablet (0.4 mg total) under the tongue every 5 (five) minutes as needed for chest pain. Up to 3 doses 11/14/16   Dunn, Dayna N, PA-C  OVER THE COUNTER MEDICATION Take 1 tablet by mouth daily. Calcium,magnesium and zinc in one.    [provider]  pantoprazole (PROTONIX) 40 MG tablet TAKE 1 TABLET DAILY 02/10/17   Unk Pinto, MD  sucralfate (CARAFATE) 1 g tablet TAKE 1 TABLET FOUR TIMES A DAY 02/10/17   Unk Pinto, MD  triamcinolone cream (KENALOG) 0.1 % Apply  1 application topically 2 (two) times daily. 03/31/17   Unk Pinto, MD  Vitamin D, Ergocalciferol, (DRISDOL) 50000 units CAPS capsule TAKE 1 CAPSULE DAILY 04/25/17   Unk Pinto, MD    Family History Family History  Problem Relation Age of Onset  . Colon cancer Mother   . Hypertension Mother   . Glaucoma Mother   . Stroke Mother   . Diabetes Father   . Alzheimer's disease Father   . Heart attack Father 78  . Nephrolithiasis Father   . Emphysema Father        was a smoker  . Heart disease Father   . Nephrolithiasis Sister   . Heart attack Brother 56  . Nephrolithiasis Brother   . Neuropathy Neg Hx     Social History Social History  Substance Use Topics  . Smoking status: Former Smoker    Packs/day: 2.00    Years: 5.00    Types: Cigarettes    Quit date: 11/18/1977  . Smokeless tobacco: Never Used  . Alcohol use 0.6 oz/week    1 Glasses of wine per week     Comment: Rare     Allergies   Ace inhibitors; Tilade [nedocromil]; Reglan [metoclopramide]; and Betadine [povidone iodine]   Review of Systems Review of Systems  Constitutional: Negative for fever.  Respiratory: Positive for shortness of breath. Negative for sputum production.   All other systems reviewed and are negative.    Physical Exam Updated Vital Signs Ht '5\' 2"'  (1.575 m)   Wt 76.2 kg (168 lb)   SpO2 96%   BMI 30.73 kg/m   Physical Exam  Constitutional: She is oriented to person, place, and time. She appears well-developed and well-nourished. No distress.  Patient is a somewhat chronically ill appearing 61 year old female.  HENT:  Head: Normocephalic and atraumatic.  Neck: Normal range of motion. Neck supple.  Cardiovascular: Normal rate and regular rhythm.  Exam reveals no gallop and no friction rub.   No murmur heard. Pulmonary/Chest: Effort normal. She has no wheezes.  There are rales in the bases bilaterally.  Abdominal: Soft. Bowel sounds are normal. She exhibits no distension.  There is no tenderness.  The prior surgical incisions are noted to the right upper quadrant. There is also a pleural catheter placed in  the right posterior thorax.  Musculoskeletal: Normal range of motion.  Neurological: She is alert and oriented to person, place, and time.  Skin: Skin is warm and dry. She is not diaphoretic.  Nursing note and vitals reviewed.    ED Treatments / Results  Labs (all labs ordered are listed, but only abnormal results are displayed) Labs Reviewed  COMPREHENSIVE METABOLIC PANEL  CBC WITH DIFFERENTIAL/PLATELET  BRAIN NATRIURETIC PEPTIDE  I-STAT TROPONIN, ED    EKG  EKG Interpretation  Date/Time:  Thursday June 19 2017 12:42:45 EDT Ventricular Rate:  109 PR Interval:    QRS Duration: 88 QT Interval:  337 QTC Calculation: 454 R Axis:   90 Text Interpretation:  Sinus tachycardia Borderline right axis deviation Borderline T abnormalities, inferior leads Confirmed by Veryl Speak [phone]) on 06/19/2017 12:49:55 PM       Radiology No results found.  Procedures Procedures (including critical care time)  Medications Ordered in ED Medications  furosemide (LASIX) injection 40 mg (not administered)     Initial Impression / Assessment and Plan / ED Course  I have reviewed the triage vital signs and the nursing notes.  Pertinent labs & imaging results that were available during my care of the patient were reviewed by me and considered in my medical decision making (see chart for details).  Patient presents with complaints of shortness of breath. She tells me this started after choking on a piece of bread this morning. Today's chest x-ray reveals possible opacity in the right lower lobe. She has extensive past medical history including cholangiocarcinoma with prior hepatectomy. She also experienced a postoperative empyema and has a chest tube in place. She is receiving IV antibiotics at her extended care facility.  She is somewhat hypoxic off of  oxygen with oxygen saturations dropping to 88%. She is also somewhat to While off of oxygen. For this reason, I feel as though she will warrant an overnight observation to rule out aspiration. I've discussed this with Dr. Karleen Hampshire from the hospitalist service who will evaluate patient in the ER.  Final Clinical Impressions(s) / ED Diagnoses   Final diagnoses:  None    New Prescriptions New Prescriptions   No medications on file     Veryl Speak, MD 06/19/17 1538

## 2017-06-19 NOTE — Plan of Care (Signed)
Problem: Safety: Goal: Ability to remain free from injury will improve Outcome: Progressing Patient's call bell is within reach and knows when to call if needing assistance. Patient's bed is at its lowest position and patient's room is near the nurses station and bed alarm is on.   Problem: Pain Managment: Goal: General experience of comfort will improve Outcome: Progressing Patient has not complained of pain thus far into the shift.   Problem: Skin Integrity: Goal: Risk for impaired skin integrity will decrease Outcome: Progressing Patient's Braden score was a 16. Placed sacral foam pad on patient to decrease risk of breakdown. Patient gets turned every two hours. Head of the bed is at 30 degrees. Foam pads were placed on patient's skin tears.

## 2017-06-19 NOTE — ED Notes (Signed)
External female catheter being used. 

## 2017-06-19 NOTE — ED Triage Notes (Addendum)
Pt in from [facility] via [facility] with sob since waking this morning. Per EMS, pt's sats were 86% on 4L on their arrival, rales present. Placed on CPAP en route and sats now 98%. Hx of recent liver resection with biliary drain placed, PICC to L arm. LE edema noted, also c/o white phlegm in sputum. Alert, temp 98.8

## 2017-06-20 DIAGNOSIS — E871 Hypo-osmolality and hyponatremia: Secondary | ICD-10-CM | POA: Diagnosis present

## 2017-06-20 DIAGNOSIS — R0603 Acute respiratory distress: Secondary | ICD-10-CM | POA: Diagnosis not present

## 2017-06-20 DIAGNOSIS — R0600 Dyspnea, unspecified: Secondary | ICD-10-CM | POA: Diagnosis not present

## 2017-06-20 DIAGNOSIS — E039 Hypothyroidism, unspecified: Secondary | ICD-10-CM | POA: Diagnosis present

## 2017-06-20 DIAGNOSIS — B9620 Unspecified Escherichia coli [E. coli] as the cause of diseases classified elsewhere: Secondary | ICD-10-CM | POA: Diagnosis present

## 2017-06-20 DIAGNOSIS — Z8509 Personal history of malignant neoplasm of other digestive organs: Secondary | ICD-10-CM | POA: Diagnosis not present

## 2017-06-20 DIAGNOSIS — E1365 Other specified diabetes mellitus with hyperglycemia: Secondary | ICD-10-CM | POA: Diagnosis not present

## 2017-06-20 DIAGNOSIS — N179 Acute kidney failure, unspecified: Secondary | ICD-10-CM | POA: Diagnosis not present

## 2017-06-20 DIAGNOSIS — Z8505 Personal history of malignant neoplasm of liver: Secondary | ICD-10-CM | POA: Diagnosis not present

## 2017-06-20 DIAGNOSIS — Z794 Long term (current) use of insulin: Secondary | ICD-10-CM | POA: Diagnosis not present

## 2017-06-20 DIAGNOSIS — Z1621 Resistance to vancomycin: Secondary | ICD-10-CM | POA: Diagnosis present

## 2017-06-20 DIAGNOSIS — I2510 Atherosclerotic heart disease of native coronary artery without angina pectoris: Secondary | ICD-10-CM | POA: Diagnosis present

## 2017-06-20 DIAGNOSIS — Z90710 Acquired absence of both cervix and uterus: Secondary | ICD-10-CM | POA: Diagnosis not present

## 2017-06-20 DIAGNOSIS — Z7982 Long term (current) use of aspirin: Secondary | ICD-10-CM | POA: Diagnosis not present

## 2017-06-20 DIAGNOSIS — Z1624 Resistance to multiple antibiotics: Secondary | ICD-10-CM | POA: Diagnosis present

## 2017-06-20 DIAGNOSIS — R188 Other ascites: Secondary | ICD-10-CM | POA: Diagnosis present

## 2017-06-20 DIAGNOSIS — J9601 Acute respiratory failure with hypoxia: Secondary | ICD-10-CM | POA: Diagnosis present

## 2017-06-20 DIAGNOSIS — Z87891 Personal history of nicotine dependence: Secondary | ICD-10-CM | POA: Diagnosis not present

## 2017-06-20 DIAGNOSIS — Z9049 Acquired absence of other specified parts of digestive tract: Secondary | ICD-10-CM | POA: Diagnosis not present

## 2017-06-20 DIAGNOSIS — Z8709 Personal history of other diseases of the respiratory system: Secondary | ICD-10-CM | POA: Diagnosis not present

## 2017-06-20 DIAGNOSIS — E1322 Other specified diabetes mellitus with diabetic chronic kidney disease: Secondary | ICD-10-CM | POA: Diagnosis not present

## 2017-06-20 DIAGNOSIS — D62 Acute posthemorrhagic anemia: Secondary | ICD-10-CM | POA: Diagnosis present

## 2017-06-20 DIAGNOSIS — I129 Hypertensive chronic kidney disease with stage 1 through stage 4 chronic kidney disease, or unspecified chronic kidney disease: Secondary | ICD-10-CM | POA: Diagnosis present

## 2017-06-20 DIAGNOSIS — R0602 Shortness of breath: Secondary | ICD-10-CM | POA: Diagnosis not present

## 2017-06-20 DIAGNOSIS — Z9884 Bariatric surgery status: Secondary | ICD-10-CM | POA: Diagnosis not present

## 2017-06-20 DIAGNOSIS — J9691 Respiratory failure, unspecified with hypoxia: Secondary | ICD-10-CM | POA: Diagnosis present

## 2017-06-20 DIAGNOSIS — J90 Pleural effusion, not elsewhere classified: Secondary | ICD-10-CM | POA: Diagnosis not present

## 2017-06-20 DIAGNOSIS — Z825 Family history of asthma and other chronic lower respiratory diseases: Secondary | ICD-10-CM | POA: Diagnosis not present

## 2017-06-20 DIAGNOSIS — E1122 Type 2 diabetes mellitus with diabetic chronic kidney disease: Secondary | ICD-10-CM | POA: Diagnosis present

## 2017-06-20 DIAGNOSIS — N183 Chronic kidney disease, stage 3 (moderate): Secondary | ICD-10-CM | POA: Diagnosis present

## 2017-06-20 DIAGNOSIS — J869 Pyothorax without fistula: Secondary | ICD-10-CM | POA: Diagnosis present

## 2017-06-20 DIAGNOSIS — I252 Old myocardial infarction: Secondary | ICD-10-CM | POA: Diagnosis not present

## 2017-06-20 LAB — COMPREHENSIVE METABOLIC PANEL
ALBUMIN: 1.9 g/dL — AB (ref 3.5–5.0)
ALT: 38 U/L (ref 14–54)
AST: 83 U/L — AB (ref 15–41)
Alkaline Phosphatase: 599 U/L — ABNORMAL HIGH (ref 38–126)
Anion gap: 4 — ABNORMAL LOW (ref 5–15)
BUN: 10 mg/dL (ref 6–20)
CHLORIDE: 104 mmol/L (ref 101–111)
CO2: 27 mmol/L (ref 22–32)
Calcium: 7.5 mg/dL — ABNORMAL LOW (ref 8.9–10.3)
Creatinine, Ser: 0.98 mg/dL (ref 0.44–1.00)
GFR calc Af Amer: >60 mL/min (ref >60)
GFR calc non Af Amer: >60 mL/min (ref >60)
GLUCOSE: 133 mg/dL — AB (ref 65–99)
POTASSIUM: 3.1 mmol/L — AB (ref 3.5–5.1)
Sodium: 135 mmol/L (ref 135–145)
Total Bilirubin: 0.8 mg/dL (ref 0.3–1.2)
Total Protein: 5.5 g/dL — ABNORMAL LOW (ref 6.5–8.1)

## 2017-06-20 LAB — CBC
HCT: 22.3 % — ABNORMAL LOW (ref 36.0–46.0)
Hemoglobin: 7.3 g/dL — ABNORMAL LOW (ref 12.0–15.0)
MCH: 29 pg (ref 26.0–34.0)
MCHC: 32.7 g/dL (ref 30.0–36.0)
MCV: 88.5 fL (ref 78.0–100.0)
PLATELETS: 109 10*3/uL — AB (ref 150–400)
RBC: 2.52 MIL/uL — ABNORMAL LOW (ref 3.87–5.11)
RDW: 19.2 % — AB (ref 11.5–15.5)
WBC: 4.8 10*3/uL (ref 4.0–10.5)

## 2017-06-20 LAB — PREPARE RBC (CROSSMATCH)

## 2017-06-20 LAB — GLUCOSE, CAPILLARY
GLUCOSE-CAPILLARY: 136 mg/dL — AB (ref 65–99)
GLUCOSE-CAPILLARY: 130 mg/dL — AB (ref 65–99)
GLUCOSE-CAPILLARY: 114 mg/dL — AB (ref 65–99)
GLUCOSE-CAPILLARY: 141 mg/dL — AB (ref 65–99)
GLUCOSE-CAPILLARY: 154 mg/dL — AB (ref 65–99)
Glucose-Capillary: 125 mg/dL — ABNORMAL HIGH (ref 65–99)

## 2017-06-20 LAB — HIV ANTIBODY (ROUTINE TESTING W REFLEX): HIV Screen 4th Generation wRfx: NONREACTIVE

## 2017-06-20 MED ORDER — INSULIN ASPART 100 UNIT/ML ~~LOC~~ SOLN: 0.0000 [IU] | Freq: Every day | SUBCUTANEOUS | Status: DC

## 2017-06-20 MED ORDER — SODIUM CHLORIDE 0.9 % IV SOLN: Freq: Once | INTRAVENOUS | Status: DC

## 2017-06-20 MED ORDER — GLUCERNA SHAKE PO LIQD
237.0000 mL | Freq: Three times a day (TID) | ORAL | Status: DC
  Administered 2017-06-21 – 2017-06-22 (×2): 237 mL via ORAL
  Filled 2017-06-20: qty 237

## 2017-06-20 MED ORDER — ALPRAZOLAM 0.5 MG PO TABS
1.0000 mg | ORAL_TABLET | Freq: Once | ORAL | Status: AC
  Administered 2017-06-20: 1 mg via ORAL
  Filled 2017-06-20: qty 2

## 2017-06-20 MED ORDER — INSULIN ASPART 100 UNIT/ML ~~LOC~~ SOLN
0.0000 [IU] | Freq: Three times a day (TID) | SUBCUTANEOUS | Status: DC
  Administered 2017-06-20: 1 [IU] via SUBCUTANEOUS
  Administered 2017-06-21 – 2017-06-23 (×3): 2 [IU] via SUBCUTANEOUS

## 2017-06-20 MED ORDER — POTASSIUM CHLORIDE CRYS ER 20 MEQ PO TBCR
40.0000 meq | EXTENDED_RELEASE_TABLET | Freq: Two times a day (BID) | ORAL | Status: DC
  Administered 2017-06-20 – 2017-06-23 (×7): 40 meq via ORAL
  Filled 2017-06-20 (×7): qty 2

## 2017-06-20 NOTE — Progress Notes (Signed)
Initial Nutrition Assessment  DOCUMENTATION CODES:   Obesity unspecified  INTERVENTION:    Glucerna Shake po TID, each supplement provides 220 kcal and 10 grams of protein  NUTRITION DIAGNOSIS:   Increased nutrient needs related to wound healing, catabolic illness (cholangiocarcinoma, s/p liver resection) as evidenced by estimated needs  GOAL:   Patient will meet greater than or equal to 90% of their needs  MONITOR:   PO intake, Supplement acceptance, Labs, Weight trends, Skin, I & O's  REASON FOR ASSESSMENT:   Consult Assessment of nutrition requirement/status  ASSESSMENT:   61 yo Female with medical history significant for cholangiocarcinoma s/p hepatectomy, empyema with chest tube placement draining serosanguinous fluid, (has her care with [facility]), was found to have VRE and MDR Ecoli in the empyema, was  Discharged to SNF with antibiotics to complete by 8/7, was brought by EMS from SNF after an episode of choking and hypoxia following it  RD spoke with patient at bedside. Nurse Tech taking blood pressure. Pt reports a good appetite. Eating well PTA, 3 meals per day. Likes Glucerna Shake nutrition supplements.  Medications reviewed and include Creon and Zyvox. Denies recent unintentional weight loss. CBG's [id number].  Nutrition focused physical exam completed.   No muscle or subcutaneous fat depletion noticed.  Moderate edema to lower extremities.  Diet Order:  Diet heart healthy/carb modified Room service appropriate? Yes; Fluid consistency: Thin  Skin:  Wound (see comment) (Right anterior chest/breast fold with healing full thickness surgical incision)  Last BM:  8/2  Height:   Ht Readings from Last 1 Encounters:  06/19/17 5\' 2"  (1.575 m)    Weight:   Wt Readings from Last 1 Encounters:  06/20/17 195 lb 12.3 oz (88.8 kg)    Ideal Body Weight:  50 kg  BMI:  Body mass index is 35.81 kg/m.  Estimated Nutritional Needs:   Kcal:   1800-2000  Protein:  100-115 gm  Fluid:  1.8-2.0 L  EDUCATION NEEDS:   No education needs identified at this time  Arthur Holms, RD, LDN Pager #: [phone] After-Hours Pager #: [phone]

## 2017-06-20 NOTE — NC FL2 (Signed)
[place] LEVEL OF CARE SCREENING TOOL     IDENTIFICATION  Patient Name: Courtney Stevens Birthdate: Jan 11, 1956 Sex: female Admission Date (Current Location): 06/19/2017  [facility] and [place] Number:  Herbalist and Address:  [facility] [place]. [facility], [place] [address] [place], [place] [address]      Provider Number: [id number]  Attending Physician Name and Address:  Hosie Poisson, MD  Relative Name and Phone Number:       Current Level of Care: Hospital Recommended Level of Care: [place] Prior Approval Number:    Date Approved/Denied:   PASRR Number: [id number]  Discharge Plan: SNF    Current Diagnoses: Patient Active Problem List   Diagnosis Date Noted  . Acute respiratory failure ([place]) 06/19/2017  . Uncontrolled secondary diabetes mellitus with stage 3 CKD (GFR 30-59) ([place]) 12/30/2016  . CKD (chronic kidney disease), stage III 11/14/2016  . CAD in native artery 11/14/2016  . PVC's (premature ventricular contractions) 11/14/2016  . NSTEMI (non-ST elevated myocardial infarction) ([place]) 11/12/2016  . Hypokalemia 11/12/2016  . Chest pain with moderate risk for cardiac etiology 03/25/2016  . BMI 25.0-25.9,adult 10/16/2015  . GERD 07/23/2015  . Vitamin D deficiency 03/24/2014  . Medication management 03/24/2014  . 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 Hypothyroidism 11/25/2013  . Essential hypertension 11/03/2013  . Hyperlipidemia 11/03/2013  . 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 Multiple pulmonary nodules 03/06/2013  . Personal history of colonic polyps 03/03/2013  . Malignant neoplasm of extrahepatic bile duct ([place]) 04/03/2010  . Type II diabetes mellitus with renal manifestations  04/03/2010  . PANCREATIC INSUFFICIENCY 04/03/2010    Orientation RESPIRATION BLADDER Height & Weight        Normal Continent Weight: 195 lb 12.3 oz (88.8 kg) Height:  5\' 2"  (157.5 cm)   BEHAVIORAL SYMPTOMS/MOOD NEUROLOGICAL BOWEL NUTRITION STATUS      Continent Diet (regular)  AMBULATORY STATUS COMMUNICATION OF NEEDS Skin   Limited Assist Verbally Surgical wounds                       Personal Care Assistance Level of Assistance  Bathing, Dressing Bathing Assistance: Limited assistance   Dressing Assistance: Limited assistance     Functional Limitations Info             SPECIAL CARE FACTORS FREQUENCY  PT (By licensed PT), OT (By licensed OT)     PT Frequency: 5/wk OT Frequency: 5/wk            Contractures      Additional Factors Info  Code Status, Allergies, Insulin Sliding Scale, Isolation Precautions Code Status Info: FULL Allergies Info: Ace Inhibitors, Tilade Nedocromil, Reglan Metoclopramide, Betadine Povidone Iodine   Insulin Sliding Scale Info: 5/day       Current Medications (06/20/2017):  This is the current hospital active medication list Current Facility-Administered Medications  Medication Dose Route Frequency Provider Last Rate Last Dose  . 0.9 %  sodium chloride infusion   Intravenous Once Hosie Poisson, MD      . anidulafungin (ERAXIS) 100 mg in sodium chloride 0.9 % 100 mL IVPB  100 mg Intravenous Q24H Norval Morton, MD   Stopped at 06/20/17 0041  . atorvastatin (LIPITOR) tablet 20 mg  20 mg Oral QHS Hosie Poisson, MD   20 mg at 06/19/17 2208  . ertapenem (INVANZ) 1 g in sodium chloride 0.9 % 50 mL IVPB  1  g Intravenous Q24H Hosie Poisson, MD   Stopped at 06/19/17 2202  . ferrous sulfate tablet 325 mg  325 mg Oral Daily Hosie Poisson, MD   325 mg at 06/20/17 0905  . fluticasone (FLONASE) 50 MCG/ACT nasal spray 2 spray  2 spray Each Nare Daily Hosie Poisson, MD   2 spray at 06/20/17 0908  . furosemide (LASIX) tablet 40 mg  40 mg Oral BID Hosie Poisson, MD   40 mg at 06/20/17 0905  . insulin aspart (novoLOG) injection 0-5 Units  0-5 Units Subcutaneous QHS Hosie Poisson, MD      . insulin aspart (novoLOG) injection 0-9 Units   0-9 Units Subcutaneous TID WC Hosie Poisson, MD      . insulin glargine (LANTUS) injection 12 Units  12 Units Subcutaneous Daily Hosie Poisson, MD   12 Units at 06/20/17 0530  . levothyroxine (SYNTHROID, LEVOTHROID) tablet 100 mcg  100 mcg Oral Susie Cassette, MD   100 mcg at 06/20/17 0905  . [START ON 06/21/2017] levothyroxine (SYNTHROID, LEVOTHROID) tablet 200 mcg  200 mcg Oral Susie Cassette, MD      . linezolid (ZYVOX) tablet 600 mg  600 mg Oral Q12H Hosie Poisson, MD   600 mg at 06/20/17 0905  . lipase/protease/amylase (CREON) capsule 12,000 Units  12,000 Units Oral TID Hosie Poisson, MD   12,000 Units at 06/20/17 0905  . nitroGLYCERIN (NITROSTAT) SL tablet 0.4 mg  0.4 mg Sublingual Q5 min PRN Hosie Poisson, MD      . oxyCODONE (Oxy IR/ROXICODONE) immediate release tablet 5-10 mg  5-10 mg Oral Q4H PRN Hosie Poisson, MD      . pantoprazole (PROTONIX) EC tablet 40 mg  40 mg Oral Daily Hosie Poisson, MD   40 mg at 06/20/17 0905  . potassium chloride SA (K-DUR,KLOR-CON) CR tablet 40 mEq  40 mEq Oral BID Hosie Poisson, MD      . sodium chloride (OCEAN) 0.65 % nasal spray 1 spray  1 spray Each Nare Q2H PRN Hosie Poisson, MD      . sodium chloride flush (NS) 0.9 % injection 10-40 mL  10-40 mL Intracatheter Q12H Hosie Poisson, MD   20 mL at 06/20/17 1000  . sodium chloride flush (NS) 0.9 % injection 10-40 mL  10-40 mL Intracatheter PRN Hosie Poisson, MD      . sucralfate (CARAFATE) tablet 1 g  1 g Oral QID Hosie Poisson, MD   1 g at 06/20/17 3009     Discharge Medications: Please see discharge summary for a list of discharge medications.  Relevant Imaging Results:  Relevant Lab Results:   Additional Information SS#: [id number]  Jorge Ny, LCSW

## 2017-06-20 NOTE — Consult Note (Signed)
Chief Complaint: Patient was seen in consultation today for shortness of breath  Referring Physician(s):  Dr. Hosie Poisson  Supervising Physician: Markus Daft  Patient Status: [facility] - In-pt  History of Present Illness: Courtney Stevens is a 61 y.o. female with past medical history of cholangiocarcinoma s/p hepatectomy, empyema with chest tube placement presented to [facility] from SNF with shortness of breath.  Patient states she has been feeling short of breath since d/c from the hospital to SNF, that shortness of breath worsening once her chest tube stopped draining.   She states she has been feeling better since admission yesterday.   CT Chest 06/19/17: 1. Status post right hepatectomy. There is debris and gas along the resection margin of the liver which appears to be subdiaphragmatic and raises concern for abscess. 2. Right-sided chest tube with complex fluid/debris and gas in the right pleural space. Empyema would be a consideration. 3. Right supraclavicular lymphadenopathy with mild mediastinal lymphadenopathy. 4. Bibasilar collapse/consolidation, right greater than left with ground-glass attenuation peripheral left upper lobe suggesting infection/inflammation.  IR consulted for possible clogged chest tube.   Past Medical History:  Diagnosis Date  . Anemia    History of blood transfusion which is when pt reports being exposed to hepatitis in 2009.  Marland Kitchen Anxiety   . Asthma   . CAD (coronary artery disease)    a. 03/2016 St Echo: nl EF, no wma-->Nl study;  b. 11/13/16 NSTEMI/Cath: EF 55-65%, 40% prox RCA, 20% prox-mid LAD, 90% D2 lesion. The culprit for the NSTEMI appeared to be the 90% ostial stenosis in second diagonal which is a 2 mm in diameter and supplies a relatively small territory. Managed medically.  . 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 CKD (chronic kidney disease), stage III   . Diabetes mellitus   . GERD (gastroesophageal reflux disease)   . H/O gastric bypass   .  Hemorrhoid   . Hepatitis   . Hyperlipidemia   . Hypertension   . Hypothyroid   . Liver tumor   . PVC's (premature ventricular contractions)   . Q fever 2015    Past Surgical History:  Procedure Laterality Date  . ABDOMINAL HYSTERECTOMY  1990   BIL OOPHORECTOMY  . APPENDECTOMY    . Bile Duct Resection  2009   [place]  . BREAST SURGERY     BIOPSY NEG  . BUNIONECTOMY Right 2005  . CARDIAC CATHETERIZATION N/A 11/13/2016   Procedure: Left Heart Cath and Coronary Angiography;  Surgeon: Wellington Hampshire, MD;  Location: [place] [facility];  Service: Cardiovascular;  Laterality: N/A;  . CHOLECYSTECTOMY  1992  . GASTRIC BYPASS  2004  . TONSILLECTOMY    . TUBAL LIGATION    . VIDEO BRONCHOSCOPY WITH ENDOBRONCHIAL ULTRASOUND N/A 10/12/2014   Procedure: VIDEO BRONCHOSCOPY WITH ENDOBRONCHIAL ULTRASOUND;  Surgeon: Brand Males, MD;  Location: [facility] OR;  Service: Thoracic;  Laterality: N/A;    Allergies: Ace inhibitors; Tilade [nedocromil]; Reglan [metoclopramide]; and Betadine [povidone iodine]  Medications: Prior to Admission medications   Medication Sig Start Date End Date Taking? Authorizing Provider  aspirin EC 81 MG EC tablet Take 1 tablet (81 mg total) by mouth daily. 11/15/16  Yes Dunn, Dayna N, PA-C  atorvastatin (LIPITOR) 20 MG tablet Take 20 mg by mouth at bedtime.   Yes [provider]  cetirizine (ZYRTEC) 10 MG tablet Take 10 mg by mouth daily.    Yes [provider]  CREON 12000  units CPEP capsule TAKE 1 CAPSULE THREE TIMES A DAY Patient taking differently: TAKE 1 CAPSULE (12000 UNITS) BY MOUTH THREE TIMES A DAY 02/10/17  Yes Unk Pinto, MD  ertapenem [facility]) IVPB Inject 1 g into the vein daily. 10 day course started 06/17/17   Yes [provider]  ferrous sulfate 325 (65 FE) MG tablet Take 325 mg by mouth daily.   Yes [provider]  fluticasone (FLONASE) 50 MCG/ACT nasal spray PLACE 2 SPRAYS INTO BOTH NOSTRILS DAILY. Patient taking  differently: Place 2 sprays into both nostrils daily.  04/25/17  Yes Unk Pinto, MD  furosemide (LASIX) 40 MG tablet TAKE 1 TO 2 TABLETS TWICE A DAY FOR FLUID AND SWELLING Patient taking differently: TAKE  1 TABLET (40 MG) TWICE A DAY (9AM AND 5PM) FOR FLUID AND SWELLING 02/10/17  Yes Unk Pinto, MD  gabapentin (NEURONTIN) 100 MG capsule TAKE 1-3 CAPSULES BY MOUTH DAILY FOR NEURITIS PAIN Patient taking differently: TAKE 1 CAPSULE (100 MG) BY MOUTH TWICE DAILY (9AM AND 5PM) FOR NEURITIS PAIN 05/07/17  Yes Unk Pinto, MD  glucagon (GLUCAGON EMERGENCY) 1 MG injection Inject 1 mg into the muscle once as needed (hypoglycemia).   Yes [provider]  insulin glargine (LANTUS) 100 unit/mL SOPN Inject 15 units daily or as directed. Patient taking differently: Inject 12 Units into the skin daily.  04/25/17  Yes Unk Pinto, MD  insulin lispro (HUMALOG) 100 UNIT/ML injection Inject 0-10 Units into the skin See admin instructions. Inject 0-10 units subcutaneously before meals and at bedtime per sliding scale: CBG 0-150 0 units, 151-200 2 units, 201-250 4 units 251-300 6 units, 301-350 8 units, 351-400 10 units, >401 call MD   Yes [provider]  levothyroxine (SYNTHROID, LEVOTHROID) 100 MCG tablet Take 100-200 mcg by mouth See admin instructions. Take 1 tablet (100 mcg) by mouth every other day alternating with 2 tablets (200 mcg) on other days   Yes [provider]  micafungin (MYCAMINE) 100 MG SOLR injection Inject 50 mg into the vein daily.   Yes [provider]  Multiple Vitamins-Minerals (MULTIVITAMIN WITH MINERALS) tablet Take 1 tablet by mouth daily.    Yes [provider]  nitroGLYCERIN (NITROSTAT) 0.4 MG SL tablet Place 1 tablet (0.4 mg total) under the tongue every 5 (five) minutes as needed for chest pain. Up to 3 doses Patient taking differently: Place 0.4 mg under the tongue every 5 (five) minutes as needed for chest pain (up to 3 doses).   11/14/16  Yes Dunn, Dayna N, PA-C  oxyCODONE (OXY IR/ROXICODONE) 5 MG immediate release tablet Take 5-10 mg by mouth every 4 (four) hours as needed (1 tablet pain 3-5/ 2 tablets pain 5-10).   Yes [provider]  pantoprazole (PROTONIX) 40 MG tablet TAKE 1 TABLET DAILY Patient taking differently: TAKE 1 TABLET (40 MG BY MOUTH DAILY 02/10/17  Yes Unk Pinto, MD  sodium chloride (OCEAN) 0.65 % SOLN nasal spray Place 1 spray into both nostrils every 2 (two) hours as needed for congestion.   Yes [provider]  sucralfate (CARAFATE) 1 g tablet TAKE 1 TABLET FOUR TIMES A DAY Patient taking differently: TAKE 1 TABLET (1 G) BY MOUTH FOUR TIMES A DAY - 9AM, 1PM, 5PM, 9PM 02/10/17  Yes Unk Pinto, MD  Vitamin D, Ergocalciferol, (DRISDOL) 50000 units CAPS capsule TAKE 1 CAPSULE DAILY Patient taking differently: TAKE 1 CAPSULE (50,000 UNITS) BY MOUTH DAILY 04/25/17  Yes Unk Pinto, MD  albuterol (VENTOLIN HFA) 108 (90  BASE) MCG/ACT inhaler Inhale 2 puffs into the lungs every 4 (four) hours as needed for wheezing or shortness of breath. Patient not taking: Reported on 06/19/2017 09/08/15   Vicie Mutters, PA-C  ALPRAZolam Duanne Moron) 1 MG tablet 1/2 to 1 tablet 3 x / day if needed for Anxiety Patient not taking: Reported on 06/19/2017 05/02/17 11/01/17  Unk Pinto, MD  atorvastatin (LIPITOR) 40 MG tablet Take 1 tablet ( 20 mg ) daily Patient not taking: Reported on 06/19/2017 04/25/17   Unk Pinto, MD  Blood Glucose Monitoring Suppl (ONE TOUCH ULTRA SYSTEM KIT) w/Device KIT 1 kit by Does not apply route once. 01/08/16   Unk Pinto, MD  calcium citrate-vitamin D (CITRACAL+D) 315-200 MG-UNIT per tablet Take 1 tablet by mouth 3 (three) times daily.    [provider]  cyclobenzaprine (FLEXERIL) 10 MG tablet TAKE 1/2 TO 1 TABLET BY MOUTH 3 TIMES A DAY AS NEEDED FOR MUSCLE SPASMS Patient not taking: Reported on 06/19/2017 05/30/17   Unk Pinto, MD  FREESTYLE LITE  test strip USE TO CHECK BLOOD SUGAR 3 TIMES A DAY DUE TO POORLY CONTROLLED DIABETES. DX E11.65 02/21/17   Unk Pinto, MD  glipiZIDE (GLUCOTROL) 5 MG tablet TAKE ONE-HALF (1/2) TO ONE TABLET THREE TIMES A DAY IF NEEDED FOR ELEVATED GLUCOSE Patient not taking: Reported on 06/19/2017 02/10/17   Unk Pinto, MD  Insulin Pen Needle 32G X 4 MM MISC Inject insulin 1-3 times daily SQ 02/05/17   Vicie Mutters, PA-C  isosorbide mononitrate (IMDUR) 60 MG 24 hr tablet Take 1 tablet (60 mg total) by mouth daily. Patient not taking: Reported on 06/19/2017 03/19/17 03/14/18  Martinique, Peter M, MD  Lancets [facility] ULTRASOFT) lancets Check blood sugar 3 times daily due to poorly controlled glucose-DX-E11.65. 01/08/16   Unk Pinto, MD  levothyroxine (SYNTHROID, LEVOTHROID) 200 MCG tablet TAKE ONE-HALF (1/2) TO ONE TABLET DAILY BEFORE BREAKFAST ALTERNATE TAKING 100 MCG AND 200 MCG Patient not taking: Reported on 06/19/2017 02/10/17   Unk Pinto, MD  linezolid (ZYVOX) 600 MG tablet Take 600 mg by mouth every 12 (twelve) hours. 10 day course ordered 06/16/17    [provider]  metFORMIN (GLUCOPHAGE XR) 500 MG 24 hr tablet Take 4 tablets daily for Diabetes Patient not taking: Reported on 06/19/2017 03/03/17 10/03/17  Unk Pinto, MD  metoCLOPramide (REGLAN) 10 MG tablet Take 1 tablet at bedtime for Reflux Patient not taking: Reported on 06/19/2017 05/06/17 08/06/17  Unk Pinto, MD  metoprolol tartrate (LOPRESSOR) 25 MG tablet Take 1 tablet (25 mg total) by mouth 2 (two) times daily. Patient not taking: Reported on 06/19/2017 04/25/17   Unk Pinto, MD  triamcinolone cream (KENALOG) 0.1 % Apply 1 application topically 2 (two) times daily. Patient not taking: Reported on 06/19/2017 03/31/17   Unk Pinto, MD     Family History  Problem Relation Age of Onset  . Colon cancer Mother   . Hypertension Mother   . Glaucoma Mother   . Stroke Mother   . Diabetes Father   . Alzheimer's disease  Father   . Heart attack Father 35  . Nephrolithiasis Father   . Emphysema Father        was a smoker  . Heart disease Father   . Nephrolithiasis Sister   . Heart attack Brother 41  . Nephrolithiasis Brother   . Neuropathy Neg Hx     Social History   Social History  . Marital status: Married    Spouse name: N/A  . Number of  children: 2  . Years of education: 12+   Occupational History  . [employer]   Social History Main Topics  . Smoking status: Former Smoker    Packs/day: 2.00    Years: 5.00    Types: Cigarettes    Quit date: 11/18/1977  . Smokeless tobacco: Never Used  . Alcohol use 0.6 oz/week    1 Glasses of wine per week     Comment: Rare  . Drug use: No  . Sexual activity: No   Other Topics Concern  . None   Social History Narrative   Lives at home w/ her husband   Right-handed   Caffeine: 2 cups coffee per day    Review of Systems  Constitutional: Negative for fatigue and fever.  Respiratory: Positive for shortness of breath. Negative for stridor.   Cardiovascular: Negative for chest pain.  Gastrointestinal: Negative for abdominal pain.  Psychiatric/Behavioral: Negative for behavioral problems and confusion.    Vital Signs: BP 122/67 (BP Location: Right Arm)   Pulse 92   Temp 98.4 F (36.9 C) (Oral)   Resp 20   Ht '5\' 2"'  (1.575 m)   Wt 195 lb 12.3 oz (88.8 kg)   SpO2 94%   BMI 35.81 kg/m   Physical Exam  Constitutional: She is oriented to person, place, and time. She appears well-developed.  Cardiovascular: Normal rate and regular rhythm.   Pulmonary/Chest: Breath sounds normal.  Large bore chest tube in place.  Actively draining to gravity.  Dressing just replaced by RN.   Neurological: She is alert and oriented to person, place, and time.  Skin: Skin is warm and dry.  Psychiatric: She has a normal mood and affect. Her behavior is normal. Judgment and thought content normal.  Nursing note and vitals reviewed.      Imaging: Ct Chest W Contrast  Result Date: 06/19/2017 CLINICAL DATA:  Shortness of breath and acute respiratory illness. EXAM: CT CHEST WITH CONTRAST TECHNIQUE: Multidetector CT imaging of the chest was performed during intravenous contrast administration. CONTRAST:  <See Chart> ISOVUE-300 IOPAMIDOL (ISOVUE-300) INJECTION 61% COMPARISON:  05/09/2017 FINDINGS: Cardiovascular: Heart size is upper normal. No pericardial effusion. Coronary artery calcification is noted. Atherosclerotic calcification is noted in the wall of the thoracic aorta. Mediastinum/Nodes: New mild right supraclavicular lymphadenopathy is identified with 9 mm short axis lymph node visible on image 20 of series 3. 12 mm short axis high right paratracheal lymph node is seen on image 28 of series 3. 13 mm subcarinal short axis lymph node was 9 mm previously. No left hilar lymphadenopathy. No discrete lymphadenopathy in the right hilum. There is no axillary lymphadenopathy. Lungs/Pleura: Small right loculated pleural fluid collection evident with gas distributed throughout the fluid/debris in the right pleural space. A right-sided chest tube is identified in the posterior right pleural space. Small layering left pleural effusion evident. There is bibasilar collapse/ consolidation, right greater than left. 3 cm focus of ground-glass attenuation identified in the peripheral left upper lobe. Upper Abdomen: Pneumobilia again noted. Surgical line along the posterior right liver compatible with right hepatectomy there is gas along the resection margin of the liver which appears to be subdiaphragmatic. Surgical changes evident in the proximal stomach. Scarring in the right kidney is incompletely visualized. Small to moderate volume ascites evident. Musculoskeletal: Similar appearance of scattered sclerotic foci in the right humerus and scapula. IMPRESSION: 1. Status post right hepatectomy. There is debris and gas along the resection margin of the liver  which appears  to be subdiaphragmatic and raises concern for abscess. 2. Right-sided chest tube with complex fluid/debris and gas in the right pleural space. Empyema would be a consideration. 3. Right supraclavicular lymphadenopathy with mild mediastinal lymphadenopathy. 4. Bibasilar collapse/consolidation, right greater than left with ground-glass attenuation peripheral left upper lobe suggesting infection/inflammation. Electronically Signed   By: Misty Stanley M.D.   On: 06/19/2017 17:25   Dg Chest Port 1 View  Result Date: 06/19/2017 CLINICAL DATA:  SOB/midline lower CP/upper abd pain x today, cough (productive) x this am, HTN, diabetic, past smoker - quit x 40 years ago EXAM: PORTABLE CHEST 1 VIEW COMPARISON:  Chest x-rays dated 12/16/2016 and 11/11/2016. Chest CT dated 05/09/2017. FINDINGS: Right-sided chest tube now in place with tip directed towards the medial aspects of the right upper lobe. Dense opacity at the right lung base, pleural effusion and/or airspace collapse. Suspect extension of pleural fluid to the right lung apex, versus hydropneumothorax. Additional lesser opacity at the left lung base, also most likely pleural effusion and/or atelectasis. Heart size and mediastinal contours appear stable. Left-sided PICC line in place with tip at the level of the upper SVC. IMPRESSION: 1. Dense opacity at the right lung base, most likely combination of pleural effusion and airspace collapse. Underlying pneumonia cannot be excluded. 2. Additional opacity at the right lung apex, most likely extension of pleural effusion, alternatively apical hydropneumothorax. Recommend short-term follow-up chest x-ray, either later today or in the morning, to ensure stability or resolution. 3. Additional opacity at the left lung base, most likely combination of atelectasis and small pleural effusion. 4. Right-sided chest tube in place. 5. Left-sided PICC line in place with tip at the level of the upper SVC. Electronically  Signed   By: Franki Cabot M.D.   On: 06/19/2017 13:26    Labs:  CBC:  Recent Labs  03/03/17 1149 05/07/17 0858 06/19/17 1306 06/20/17 0752  WBC 4.5 7.2 6.0 4.8  HGB 11.9 11.8 9.4* 7.3*  HCT 38.1 36.3 27.8* 22.3*  PLT 276 278 104* 109*    COAGS:  Recent Labs  11/12/16 0123  INR 1.00    BMP:  Recent Labs  03/03/17 1149 05/07/17 0858 06/19/17 1306 06/20/17 0752  NA 141 137 131* 135  K 4.0 3.6 3.6 3.1*  CL 102 99 97* 104  CO2 '26 29 24 27  ' GLUCOSE 260* 183* 156* 133*  BUN '9 11 13 10  ' CALCIUM 8.3* 8.2* 8.3* 7.5*  CREATININE 0.86 1.37* 1.18* 0.98  GFRNONAA 73 41* 48* >60  GFRAA 84 48* 56* >60    LIVER FUNCTION TESTS:  Recent Labs  01/10/17 0906 03/03/17 1149 06/19/17 1306 06/20/17 0752  BILITOT 0.4 0.2 1.1 0.8  AST 96* 135* 107* 83*  ALT 70* 67* 47 38  ALKPHOS 138* 216* 825* 599*  PROT 4.6* 5.2* 7.0 5.5*  ALBUMIN 2.5* 2.5* 2.4* 1.9*    TUMOR MARKERS: No results for input(s): AFPTM, CEA, CA199, CHROMGRNA in the last 8760 hours.  Assessment and Plan: Empyema Patient with past medical history of cholangiocarcinoma s/p hepatectomy, empyema with chest tube presents with complaint of shortness of breath.  IR consulted for potentially clogged tube at the request of Dr. Karleen Hampshire. PA to bedside to assess.  RN states tube system was connected to bag and appeared to be clogged.  Since reconnecting to pleurvac patient has had improved output.  This is a large bore chest tube which is currently draining to gravity. Case reviewed with Dr. Anselm Pancoast.  Recommend consulting cardiothoracic  surgery if further issues with tube.   Thank you for this interesting consult.  I greatly enjoyed meeting Courtney Stevens and look forward to participating in their care.  A copy of this report was sent to the requesting provider on this date.  Electronically Signed: Docia Barrier, PA 06/20/2017, 11:16 AM

## 2017-06-20 NOTE — Consult Note (Addendum)
[facility] Nurse wound consult note Reason for Consult: Consult requested for right chest wound.  Pt had surgery at [facility] and has a chest drain intact to right posterior chest. There is no open wound surrounding the insertion site.  There are patchy areas a of red partial thickness wounds; appearance consistent with medical adhesive related skin damage. Applied split-thickness gauze and paper tape; pt states she is allergic to adhesive tape. Right anterior chest/breast fold with healing full thickness surgical incision; no open wound, drainage, or fluctuance, scar tissue is red and slightly raised.  Foam dressing to protect skin folds from rubbing against the site. Wound type: Right lower chest with a drainage bag in place with small amt yellow drainage.  Removed and there is a pin-hole full thickness wound; appears to be from a previous drain.  No drainage, odor, or fluctuance at this time.  Applied gauze dressing and paper tape.  Discussed plan of care with patient and she verbalized understanding. Please re-consult if further assistance is needed.  Thank-you,  Julien Girt MSN, [place], [place], [place], [place]

## 2017-06-20 NOTE — Clinical Social Work Note (Signed)
CSW received consult that patient from a facility - [facility]. Assessment has been completed and patient will be ready for d/c in 1/2 days per MD note. Patient has Astronomer and PT/OT evaluations needed for insurance authorization. CSW will continue to follow and facilitate d/c to SNF when medically stable and insurance authorization received.  Jeancarlos Marchena Givens, MSW, LCSW Licensed Clinical Social Worker [place] [phone]

## 2017-06-20 NOTE — Progress Notes (Signed)
PROGRESS NOTE    Courtney Stevens  YBO:[id number] DOB: 1956/02/16 DOA: 06/19/2017 PCP: Courtney Pinto, MD    Brief Narrative:Courtney Stevens is a 61 y.o. 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 On talking to the patient and family at bedside, they report that she has been feeling sob since she was discharged on Monday. She denies any chest pain , fever or chills. Nausea, vomiting diarrhea. Or abdominal pain. CXR in ED, revealed rigth lower lobe infiltrate and upper lobe infiltrate/ opacity. She was referred to medical service for further evaluation.   Assessment & Plan:   Active Problems:   Acute respiratory failure (HCC)  Acute respiratory failure with hypoxia: un clear if its the chocking episode vs the chest tube not draining well leading to SOB. CT chest with contrast shows Small right loculated pleural fluid collection evident with gas distributed throughout the fluid/debris in the rightpleural space.. Small layering left pleural effusion evident.There is bibasilar collapse/ consolidation, right greater than left. 3 cm focus of ground-glass attenuation identified in the peripheral left upper lobe.   Wean her off the oxygen as appropriate.  Get OOB.   2 Chest tubes placed secondary to pneumothorax, one was removed . She was transitioned to pneumostat prior to discharge.  IR consulted to see if the chest tube is blocked in any way.   VRE and MDR E coli and enterobacter aerogenes in the empyema on the right  Complete the treatment with zyvox, ertepenam and Eraxis by 8/7.  Further management as per ID as outpatient.   Anemia of acute illness and blood loss from the chest  tube:  She received 2 units of prbc at [facility] last week, and her H&H is 7.3 today.  Transfuse another 2 units of prbc and recheck cbc in am.    Anasarca/ ascites:  Dietary consult to improve her nutritional status. Lasix BID.   H/O Cholangiocarcinoma s/p resection of the liver:  Surgical incisions healing appropriately.  Outpatient follow up   Hypertension; well controlled.    Diabetes Mellitus:  CBG (last 3)   Recent Labs  06/20/17 0529 06/20/17 0806 06/20/17 1159  GLUCAP 136* 130* 114*    Resume SSI and lantus.     DVT prophylaxis: (Lovenox/) Code Status: (Full) Family Communication: none at bedside today.  Disposition Plan: possibly back to SNF in 1 to 2 days.   Consultants:   IR to evaluate the chest tube drain.    Procedures: CT chest with contrast.    Antimicrobials:ertepenam, zyvox and  Eraxis till 8/7    Subjective: Reports breathing better but not back to baseline.   Objective: Vitals:   06/20/17 0455 06/20/17 0600 06/20/17 0804 06/20/17 1058  BP: 114/66 117/66  122/67  Pulse: 94 94 96 92  Resp: 16 15 18 20   Temp: 98.3 F (36.8 C)  98.7 F (37.1 C) 98.4 F (36.9 C)  TempSrc: Axillary  Oral Oral  SpO2: 92% 99% 99% 94%  Weight: 88.8 kg (195 lb 12.3 oz)     Height:        Intake/Output Summary (Last 24 hours) at 06/20/17 1404 Last data filed at 06/20/17 0643  Gross per 24 hour  Intake  305 ml  Output              700 ml  Net             -395 ml   Filed Weights   06/19/17 1244 06/20/17 0455  Weight: 76.2 kg (168 lb) 88.8 kg (195 lb 12.3 oz)    Examination:  General exam: ill looking lady, appears comfortable.  Respiratory system: diminished air entry at bases more on the right when ompared to the left.  Cardiovascular system: S1 & S2 heard, RJJ.8/8 systolic murmer present. 2+ pedal edema. Gastrointestinal system: abdominal incision clean, no erythema there is right anterior chest all surgical incision, not draining.  Soft abdomen, no tenderness, bowel sounds heard.  Central nervous system: Alert and oriented. No focal neurological deficits. Extremities: Symmetric 5 x 5 power. 2+ pedal edema.  Skin: No rashes, lesions or ulcers Psychiatry: Judgement and insight appear normal. Mood & affect appropriate.     Data Reviewed: I have personally reviewed following labs and imaging studies  CBC:  Recent Labs Lab 06/19/17 1306 06/20/17 0752  WBC 6.0 4.8  NEUTROABS 3.8  --   HGB 9.4* 7.3*  HCT 27.8* 22.3*  MCV 87.7 88.5  PLT 104* 416*   Basic Metabolic Panel:  Recent Labs Lab 06/19/17 1306 06/20/17 0752  NA 131* 135  K 3.6 3.1*  CL 97* 104  CO2 24 27  GLUCOSE 156* 133*  BUN 13 10  CREATININE 1.18* 0.98  CALCIUM 8.3* 7.5*   GFR: Estimated Creatinine Clearance: 61.6 mL/min (by C-G formula based on SCr of 0.98 mg/dL). Liver Function Tests:  Recent Labs Lab 06/19/17 1306 06/20/17 0752  AST 107* 83*  ALT 47 38  ALKPHOS 825* 599*  BILITOT 1.1 0.8  PROT 7.0 5.5*  ALBUMIN 2.4* 1.9*   No results for input(s): LIPASE, AMYLASE in the last 168 hours. No results for input(s): AMMONIA in the last 168 hours. Coagulation Profile: No results for input(s): INR, PROTIME in the last 168 hours. Cardiac Enzymes: No results for input(s): CKTOTAL, CKMB, CKMBINDEX, TROPONINI in the last 168 hours. BNP (last 3 results) No results for input(s): PROBNP in the last 8760 hours. HbA1C: No results for input(s): HGBA1C in the last 72 hours. CBG:  Recent Labs Lab 06/20/17 0529 06/20/17 0806 06/20/17 1159  GLUCAP 136* 130* 114*   Lipid Profile: No results for input(s): CHOL, HDL, LDLCALC, TRIG, CHOLHDL, LDLDIRECT in the last 72 hours. Thyroid Function Tests: No results for input(s): TSH, T4TOTAL, FREET4, T3FREE, THYROIDAB in the last 72 hours. Anemia Panel: No results for input(s): VITAMINB12, FOLATE, FERRITIN, TIBC, IRON, RETICCTPCT in the last 72 hours. Sepsis Labs: No results for input(s):  PROCALCITON, LATICACIDVEN in the last 168 hours.  Recent Results (from the past 240 hour(s))  MRSA PCR Screening     Status: None   Collection Time: 06/19/17  9:15 PM  Result Value Ref Range Status   MRSA by PCR NEGATIVE NEGATIVE Final    Comment:        The GeneXpert MRSA Assay (FDA approved for NASAL specimens only), is one component of a comprehensive MRSA colonization surveillance program. It is not intended to diagnose MRSA infection nor to guide or monitor treatment for MRSA infections.          Radiology Studies: Ct Chest W Contrast  Result Date: 06/19/2017 CLINICAL DATA:  Shortness of breath and acute respiratory illness. EXAM: CT CHEST WITH CONTRAST TECHNIQUE: Multidetector CT imaging of the chest was performed  during intravenous contrast administration. CONTRAST:  <See Chart> ISOVUE-300 IOPAMIDOL (ISOVUE-300) INJECTION 61% COMPARISON:  05/09/2017 FINDINGS: Cardiovascular: Heart size is upper normal. No pericardial effusion. Coronary artery calcification is noted. Atherosclerotic calcification is noted in the wall of the thoracic aorta. Mediastinum/Nodes: New mild right supraclavicular lymphadenopathy is identified with 9 mm short axis lymph node visible on image 20 of series 3. 12 mm short axis high right paratracheal lymph node is seen on image 28 of series 3. 13 mm subcarinal short axis lymph node was 9 mm previously. No left hilar lymphadenopathy. No discrete lymphadenopathy in the right hilum. There is no axillary lymphadenopathy. Lungs/Pleura: Small right loculated pleural fluid collection evident with gas distributed throughout the fluid/debris in the right pleural space. A right-sided chest tube is identified in the posterior right pleural space. Small layering left pleural effusion evident. There is bibasilar collapse/ consolidation, right greater than left. 3 cm focus of ground-glass attenuation identified in the peripheral left upper lobe. Upper Abdomen: Pneumobilia  again noted. Surgical line along the posterior right liver compatible with right hepatectomy there is gas along the resection margin of the liver which appears to be subdiaphragmatic. Surgical changes evident in the proximal stomach. Scarring in the right kidney is incompletely visualized. Small to moderate volume ascites evident. Musculoskeletal: Similar appearance of scattered sclerotic foci in the right humerus and scapula. IMPRESSION: 1. Status post right hepatectomy. There is debris and gas along the resection margin of the liver which appears to be subdiaphragmatic and raises concern for abscess. 2. Right-sided chest tube with complex fluid/debris and gas in the right pleural space. Empyema would be a consideration. 3. Right supraclavicular lymphadenopathy with mild mediastinal lymphadenopathy. 4. Bibasilar collapse/consolidation, right greater than left with ground-glass attenuation peripheral left upper lobe suggesting infection/inflammation. Electronically Signed   By: Misty Stanley M.D.   On: 06/19/2017 17:25   Dg Chest Port 1 View  Result Date: 06/19/2017 CLINICAL DATA:  SOB/midline lower CP/upper abd pain x today, cough (productive) x this am, HTN, diabetic, past smoker - quit x 40 years ago EXAM: PORTABLE CHEST 1 VIEW COMPARISON:  Chest x-rays dated 12/16/2016 and 11/11/2016. Chest CT dated 05/09/2017. FINDINGS: Right-sided chest tube now in place with tip directed towards the medial aspects of the right upper lobe. Dense opacity at the right lung base, pleural effusion and/or airspace collapse. Suspect extension of pleural fluid to the right lung apex, versus hydropneumothorax. Additional lesser opacity at the left lung base, also most likely pleural effusion and/or atelectasis. Heart size and mediastinal contours appear stable. Left-sided PICC line in place with tip at the level of the upper SVC. IMPRESSION: 1. Dense opacity at the right lung base, most likely combination of pleural effusion and  airspace collapse. Underlying pneumonia cannot be excluded. 2. Additional opacity at the right lung apex, most likely extension of pleural effusion, alternatively apical hydropneumothorax. Recommend short-term follow-up chest x-ray, either later today or in the morning, to ensure stability or resolution. 3. Additional opacity at the left lung base, most likely combination of atelectasis and small pleural effusion. 4. Right-sided chest tube in place. 5. Left-sided PICC line in place with tip at the level of the upper SVC. Electronically Signed   By: Franki Cabot M.D.   On: 06/19/2017 13:26        Scheduled Meds: . atorvastatin  20 mg Oral QHS  . ferrous sulfate  325 mg Oral Daily  . fluticasone  2 spray Each Nare Daily  . furosemide  40 mg  Oral BID  . insulin glargine  12 Units Subcutaneous Daily  . levothyroxine  100 mcg Oral QODAY  . [START ON 06/21/2017] levothyroxine  200 mcg Oral QODAY  . linezolid  600 mg Oral Q12H  . lipase/protease/amylase  12,000 Units Oral TID  . pantoprazole  40 mg Oral Daily  . sodium chloride flush  10-40 mL Intracatheter Q12H  . sucralfate  1 g Oral QID   Continuous Infusions: . anidulafungin Stopped (06/20/17 0041)  . ertapenem Stopped (06/19/17 2202)     LOS: 0 days    Time spent: 35 minutes.     Hosie Poisson, MD [facility] Pager [phone] If 7PM-7AM, please contact night-coverage [email or web address] Password [email or web address] 06/20/2017, 2:04 PM

## 2017-06-20 NOTE — Clinical Social Work Note (Signed)
Clinical Social Work Assessment  Patient Details  Name: Courtney Stevens MRN: [id number] Date of Birth: Apr 02, 1956  Date of referral:  06/20/17               Reason for consult:  Facility Placement                Permission sought to share information with:  Chartered certified accountant granted to share information::  Yes, Verbal Permission Granted  Name::        Agency::  [facility]  Relationship::     Contact Information:     Housing/Transportation Living arrangements for the past 2 months:  [place] of Information:  Patient Patient Interpreter Needed:  None Criminal Activity/Legal Involvement Pertinent to Current Situation/Hospitalization:  No - Comment as needed Significant Relationships:  Spouse Lives with:  Spouse Do you feel safe going back to the place where you live?  No Need for family participation in patient care:  No (Coment)  Care giving concerns:  Pt was at SNF for short term rehab and IV antibiotics prior to coming to hospital- still requiring extra medical attention and therapy   Social Worker assessment / plan:  CSW met with pt to confirm that she came in from SNF.  Pt confirms she is from [place].  Employment status:    Insurance information:  Managed Care PT Recommendations:  Not assessed at this time Information / Referral to community resources:  [place]  Patient/Family's Response to care:  Pt agreeable to return to SNF- understands she cannot go home in current state.  Patient/Family's Understanding of and Emotional Response to Diagnosis, Current Treatment, and Prognosis:  Pt has good understanding- feeling poorly today but can voice what is going on and the plan for her medical care clearly.  Emotional Assessment Appearance:  Appears stated age Attitude/Demeanor/Rapport:    Affect (typically observed):  Accepting, Appropriate Orientation:  Oriented to Situation,  Oriented to  Time, Oriented to Place, Oriented to Self Alcohol / Substance use:  Not Applicable Psych involvement (Current and /or in the community):  No (Comment)  Discharge Needs  Concerns to be addressed:  Care Coordination Readmission within the last 30 days:  Yes Current discharge risk:  Physical Impairment Barriers to Discharge:  Continued Medical Work up   Jorge Ny, LCSW 06/20/2017, 3:01 PM

## 2017-06-20 NOTE — Progress Notes (Signed)
Requested PT OT eval as needed per CSW from Dr Karleen Hampshire.

## 2017-06-21 ENCOUNTER — Inpatient Hospital Stay (HOSPITAL_COMMUNITY): Payer: 59

## 2017-06-21 DIAGNOSIS — E039 Hypothyroidism, unspecified: Secondary | ICD-10-CM

## 2017-06-21 DIAGNOSIS — E1322 Other specified diabetes mellitus with diabetic chronic kidney disease: Secondary | ICD-10-CM

## 2017-06-21 DIAGNOSIS — R0603 Acute respiratory distress: Secondary | ICD-10-CM

## 2017-06-21 DIAGNOSIS — N183 Chronic kidney disease, stage 3 (moderate): Secondary | ICD-10-CM

## 2017-06-21 DIAGNOSIS — E1365 Other specified diabetes mellitus with hyperglycemia: Secondary | ICD-10-CM

## 2017-06-21 LAB — BASIC METABOLIC PANEL
ANION GAP: 11 (ref 5–15)
BUN: 6 mg/dL (ref 6–20)
CALCIUM: 8.3 mg/dL — AB (ref 8.9–10.3)
CO2: 24 mmol/L (ref 22–32)
Chloride: 99 mmol/L — ABNORMAL LOW (ref 101–111)
Creatinine, Ser: 1.18 mg/dL — ABNORMAL HIGH (ref 0.44–1.00)
GFR, EST AFRICAN AMERICAN: 56 mL/min — AB (ref >60)
GFR, EST NON AFRICAN AMERICAN: 48 mL/min — AB (ref >60)
Glucose, Bld: 167 mg/dL — ABNORMAL HIGH (ref 65–99)
Potassium: 3.8 mmol/L (ref 3.5–5.1)
Sodium: 134 mmol/L — ABNORMAL LOW (ref 135–145)

## 2017-06-21 LAB — C DIFFICILE QUICK SCREEN W PCR REFLEX
C DIFFICLE (CDIFF) ANTIGEN: NEGATIVE
C Diff interpretation: NOT DETECTED
C Diff toxin: NEGATIVE

## 2017-06-21 LAB — GLUCOSE, CAPILLARY
GLUCOSE-CAPILLARY: 115 mg/dL — AB (ref 65–99)
Glucose-Capillary: 181 mg/dL — ABNORMAL HIGH (ref 65–99)
Glucose-Capillary: 106 mg/dL — ABNORMAL HIGH (ref 65–99)
Glucose-Capillary: 147 mg/dL — ABNORMAL HIGH (ref 65–99)

## 2017-06-21 LAB — CBC
HEMATOCRIT: 31.2 % — AB (ref 36.0–46.0)
Hemoglobin: 10.3 g/dL — ABNORMAL LOW (ref 12.0–15.0)
MCH: 29.3 pg (ref 26.0–34.0)
MCHC: 33 g/dL (ref 30.0–36.0)
MCV: 88.6 fL (ref 78.0–100.0)
Platelets: 122 10*3/uL — ABNORMAL LOW (ref 150–400)
RBC: 3.52 MIL/uL — ABNORMAL LOW (ref 3.87–5.11)
RDW: 17.4 % — ABNORMAL HIGH (ref 11.5–15.5)
WBC: 6.3 10*3/uL (ref 4.0–10.5)

## 2017-06-21 MED ORDER — ALPRAZOLAM 0.5 MG PO TABS
0.5000 mg | ORAL_TABLET | Freq: Every evening | ORAL | Status: DC | PRN
  Administered 2017-06-21 – 2017-06-22 (×2): 0.5 mg via ORAL
  Filled 2017-06-21 (×2): qty 1

## 2017-06-21 MED ORDER — FUROSEMIDE 10 MG/ML IJ SOLN
40.0000 mg | Freq: Once | INTRAMUSCULAR | Status: AC
  Administered 2017-06-21: 40 mg via INTRAVENOUS
  Filled 2017-06-21: qty 4

## 2017-06-21 NOTE — Significant Event (Signed)
Rapid Response Event Note  Overview: Called by bedside RN d/t concern for SOB Time Called: 0327 Arrival Time: 0329 Event Type: Respiratory  Initial Focused Assessment: Pt laying in bed, alert and oriented (confused at times per RN).  Pt on her 2nd unit PRBCs.  Patient with mild SOB with accessory muscle use.  RR-26, 97% on 1.5 L, HR-87, BP-130/84.  L lung with crackles in bases, R lung diminished with crackles t/o. Interventions: PCXR order.   Plan of Care (if not transferred):  O2, Alert NP of PCXR results.  Call RRT if further assistance needed.  Event Summary: Name of Physician Notified: Walden Field, NP at [phone]    at    Outcome: Stayed in room and stabalized  Event End Time: 0345  Dillard Essex

## 2017-06-21 NOTE — Progress Notes (Signed)
Pt able to transfer to [facility] and chair today with rolling walker and one person assistance, states she feels much better being able to get out of bed, pt noted to be having multiple mucous type stools, MD notified, sample collected and sent for cx, Edward Qualia RN

## 2017-06-21 NOTE — Progress Notes (Signed)
PROGRESS NOTE    Courtney Stevens  LPF:[id number] DOB: Jul 26, 1956 DOA: 06/19/2017 PCP: Unk Pinto, MD    Brief Narrative:Courtney Stevens is a 61 y.o. 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 On talking to the patient and family at bedside, they report that she has been feeling sob since she was discharged on Monday. She denies any chest pain , fever or chills. Nausea, vomiting diarrhea. Or abdominal pain. CXR in ED, revealed rigth lower lobe infiltrate and upper lobe infiltrate/ opacity. She was referred to medical service for further evaluation.   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 CT chest with contrast shows Small right loculated pleural fluid collection evident with gas distributed throughout the fluid/debris in the rightpleural space.. Small layering left pleural effusion evident.There is bibasilar collapse/ consolidation, right greater than left. 3 cm focus of ground-glass attenuation identified in the peripheral left upper lobe. Wean her off the oxygen as appropriate.  Get OOB. Overnight patient had trouble breathing probably sec to fluid overload from the prbc transfusion, she was given a dose of IV lasix and she diuresed about 1400 ml urine.   2 Chest tubes placed secondary to pneumothorax, one was removed . She was transitioned to pneumostat prior to discharge.  IR consulted , chest tube is  draining well.   VRE and MDR E coli and enterobacter aerogenes in the empyema on the right  Complete the treatment with zyvox, ertepenam and Eraxis by 8/7.  Further management as per ID as outpatient.   Anemia of acute illness and blood loss from the chest tube:  She received 2 units of prbc at [place] [facility] last week, and her H&H is 7.3 today.  Transfuse another 2 units of prbcand repeat CBC this am showed hemoglobin about 10.    Anasarca/ ascites:  Dietary consult to improve her nutritional status. Lasix BID. One dose of IV lasix ordered.    H/O Cholangiocarcinoma s/p resection of the liver:  Surgical incisions healing appropriately.  Outpatient follow up   Hypertension; well controlled.    Diabetes Mellitus:  CBG (last 3)   Recent Labs  06/21/17 0001 06/21/17 0442 06/21/17 1140  GLUCAP 125* 115* 181*    Resume SSI and lantus.    Diarrhea:  She has a h/o c diff antigen. She was recently treated for c diff with oral vancomycin.  c diff pcr ordered.    DVT prophylaxis: (Lovenox/) Code Status: (Full) Family Communication: none at bedside today.  Disposition Plan: possibly back to SNF in 1 to 2 days.   Consultants:   IR to evaluate the chest tube drain.    Procedures: CT chest with contrast.    Antimicrobials:ertepenam, zyvox and  Eraxis till 8/7    Subjective: Pt wants to get out of bed.   Objective: Vitals:   06/21/17 0038 06/21/17 0420 06/21/17 0852 06/21/17 1141  BP: 123/63 126/74 120/74 125/79  Pulse: 89 88  91 96  Resp: (!) 24 (!) 24 13 (!) 25  Temp: 98 F (36.7 C) 97.7 F (36.5 C) 97.9 F (36.6 C) 97.9 F (36.6 C)  TempSrc: Oral Oral Oral Oral  SpO2: 99% 99% 98% 94%  Weight:      Height:        Intake/Output Summary (Last 24 hours) at 06/21/17 1325 Last data filed at 06/21/17 1312  Gross per 24 hour  Intake             1280 ml  Output             1715 ml  Net             -435 ml   Filed Weights   06/19/17 1244 06/20/17 0455  Weight:  76.2 kg (168 lb) 88.8 kg (195 lb 12.3 oz)    Examination:  General exam: ill looking lady, appears comfortable. On 2 lit of [place] oxygen.  Respiratory system: diminished air entry at bases more on the right when ompared to the left. No wheezing or rhonchi.  Cardiovascular system: S1 & S2 heard, BTD.1/7 systolic murmer present. 2+ pedal edema. Gastrointestinal system: abdominal incision clean, no erythema there is right anterior chest all surgical incision, not draining. Soft abdomen, no tenderness, bowel sounds heard.  Central nervous system: Alert and oriented. No focal neurological deficits. Extremities: Symmetric 5 x 5 power. 2+ pedal edema.  Skin: No rashes, lesions or ulcers Psychiatry: Judgement and insight appear normal. Mood & affect appropriate.     Data Reviewed: I have personally reviewed following labs and imaging studies  CBC:  Recent Labs Lab 06/19/17 1306 06/20/17 0752 06/21/17 0630  WBC 6.0 4.8 6.3  NEUTROABS 3.8  --   --   HGB 9.4* 7.3* 10.3*  HCT 27.8* 22.3* 31.2*  MCV 87.7 88.5 88.6  PLT 104* 109* 616*   Basic Metabolic Panel:  Recent Labs Lab 06/19/17 1306 06/20/17 0752 06/21/17 1128  NA 131* 135 134*  K 3.6 3.1* 3.8  CL 97* 104 99*  CO2 24 27 24   GLUCOSE 156* 133* 167*  BUN 13 10 6   CREATININE 1.18* 0.98 1.18*  CALCIUM 8.3* 7.5* 8.3*   GFR: Estimated Creatinine Clearance: 51.2 mL/min (A) (by C-G formula based on SCr of 1.18 mg/dL (H)). Liver Function Tests:  Recent Labs Lab 06/19/17 1306 06/20/17 0752  AST 107* 83*  ALT 47 38  ALKPHOS 825* 599*  BILITOT 1.1 0.8  PROT 7.0 5.5*  ALBUMIN 2.4* 1.9*   No results for input(s): LIPASE, AMYLASE in the last 168 hours. No results for input(s): AMMONIA in the last 168 hours. Coagulation Profile: No results for input(s): INR, PROTIME in the last 168 hours. Cardiac Enzymes: No results for input(s): CKTOTAL, CKMB, CKMBINDEX, TROPONINI in the last 168 hours. BNP (last 3 results) No results for  input(s): PROBNP in the last 8760 hours. HbA1C: No results for input(s): HGBA1C in the last 72 hours. CBG:  Recent Labs Lab 06/20/17 1631 06/20/17 2020 06/21/17 0001 06/21/17 0442 06/21/17 1140  GLUCAP 141* 154* 125* 115* 181*   Lipid Profile: No results for input(s): CHOL, HDL, LDLCALC, TRIG, CHOLHDL, LDLDIRECT in the last 72 hours. Thyroid Function Tests: No results for input(s): TSH, T4TOTAL, FREET4, T3FREE, THYROIDAB in the last 72 hours. Anemia Panel: No results for input(s): VITAMINB12, FOLATE, FERRITIN, TIBC, IRON, RETICCTPCT in the last 72 hours. Sepsis Labs: No results for input(s): PROCALCITON, LATICACIDVEN in the last 168 hours.  Recent Results (from the  past 240 hour(s))  MRSA PCR Screening     Status: None   Collection Time: 06/19/17  9:15 PM  Result Value Ref Range Status   MRSA by PCR NEGATIVE NEGATIVE Final    Comment:        The GeneXpert MRSA Assay (FDA approved for NASAL specimens only), is one component of a comprehensive MRSA colonization surveillance program. It is not intended to diagnose MRSA infection nor to guide or monitor treatment for MRSA infections.          Radiology Studies: Ct Chest W Contrast  Result Date: 06/19/2017 CLINICAL DATA:  Shortness of breath and acute respiratory illness. EXAM: CT CHEST WITH CONTRAST TECHNIQUE: Multidetector CT imaging of the chest was performed during intravenous contrast administration. CONTRAST:  <See Chart> ISOVUE-300 IOPAMIDOL (ISOVUE-300) INJECTION 61% COMPARISON:  05/09/2017 FINDINGS: Cardiovascular: Heart size is upper normal. No pericardial effusion. Coronary artery calcification is noted. Atherosclerotic calcification is noted in the wall of the thoracic aorta. Mediastinum/Nodes: New mild right supraclavicular lymphadenopathy is identified with 9 mm short axis lymph node visible on image 20 of series 3. 12 mm short axis high right paratracheal lymph node is seen on image 28 of series 3. 13 mm  subcarinal short axis lymph node was 9 mm previously. No left hilar lymphadenopathy. No discrete lymphadenopathy in the right hilum. There is no axillary lymphadenopathy. Lungs/Pleura: Small right loculated pleural fluid collection evident with gas distributed throughout the fluid/debris in the right pleural space. A right-sided chest tube is identified in the posterior right pleural space. Small layering left pleural effusion evident. There is bibasilar collapse/ consolidation, right greater than left. 3 cm focus of ground-glass attenuation identified in the peripheral left upper lobe. Upper Abdomen: Pneumobilia again noted. Surgical line along the posterior right liver compatible with right hepatectomy there is gas along the resection margin of the liver which appears to be subdiaphragmatic. Surgical changes evident in the proximal stomach. Scarring in the right kidney is incompletely visualized. Small to moderate volume ascites evident. Musculoskeletal: Similar appearance of scattered sclerotic foci in the right humerus and scapula. IMPRESSION: 1. Status post right hepatectomy. There is debris and gas along the resection margin of the liver which appears to be subdiaphragmatic and raises concern for abscess. 2. Right-sided chest tube with complex fluid/debris and gas in the right pleural space. Empyema would be a consideration. 3. Right supraclavicular lymphadenopathy with mild mediastinal lymphadenopathy. 4. Bibasilar collapse/consolidation, right greater than left with ground-glass attenuation peripheral left upper lobe suggesting infection/inflammation. Electronically Signed   By: Misty Stanley M.D.   On: 06/19/2017 17:25   Dg Chest Port 1 View  Result Date: 06/21/2017 CLINICAL DATA:  Initial evaluation for acute respiratory distress. EXAM: PORTABLE CHEST 1 VIEW COMPARISON:  Prior radiograph from 06/19/2017. FINDINGS: Stable cardiomegaly. Mediastinal silhouette within normal limits. Left-sided PICC catheter  remains in place with tip at the confluence of the brachiocephalic vein and SVC, stable. Right-sided chest tube remains in place. Side hole closely approximates the bony confines of the right hemithorax. Right pleural effusion, extending to the right lung apex, potentially reflecting hydropneumothorax. Dense right lower lobe opacity, most likely combination of fusion an atelectasis. Left lower lobe opacity similar to previous, most likely atelectasis. Small left pleural effusion again seen. There has been slightly worsened vascular congestion as compared to previous exam, suspicious for developing pulmonary edema. Osseous structures unchanged. IMPRESSION: 1. Right-sided chest tube in similar position, although the side-hole closely approximates the lateral margin of the bony right hemithorax.  Bedside check to ensure adequate functioning recommended. 2. Persistent right pleural effusion extending into the right lung apex, similar to previous. Again, finding may reflect an apical hydropneumothorax. 3. Slightly increased pulmonary vascular congestion as compared to previous exam, suspicious for developing pulmonary interstitial edema. 4. Dense bibasilar airspace opacities, likely combination of effusion with atelectasis. Infiltrates not excluded, and could be considered in the correct clinical setting. 5. Small left pleural effusion. Electronically Signed   By: Jeannine Boga M.D.   On: 06/21/2017 04:54        Scheduled Meds: . atorvastatin  20 mg Oral QHS  . feeding supplement (GLUCERNA SHAKE)  237 mL Oral TID BM  . ferrous sulfate  325 mg Oral Daily  . fluticasone  2 spray Each Nare Daily  . furosemide  40 mg Oral BID  . insulin aspart  0-5 Units Subcutaneous QHS  . insulin aspart  0-9 Units Subcutaneous TID WC  . insulin glargine  12 Units Subcutaneous Daily  . levothyroxine  100 mcg Oral QODAY  . levothyroxine  200 mcg Oral QODAY  . linezolid  600 mg Oral Q12H  . lipase/protease/amylase   12,000 Units Oral TID  . pantoprazole  40 mg Oral Daily  . potassium chloride  40 mEq Oral BID  . sodium chloride flush  10-40 mL Intracatheter Q12H  . sucralfate  1 g Oral QID   Continuous Infusions: . sodium chloride    . anidulafungin Stopped (06/20/17 2346)  . ertapenem Stopped (06/20/17 2106)     LOS: 1 day    Time spent: 35 minutes.     Hosie Poisson, MD [facility] Pager [phone] If 7PM-7AM, please contact night-coverage [email or web address] Password [email or web address] 06/21/2017, 1:25 PM

## 2017-06-21 NOTE — Progress Notes (Signed)
Patient was receiving 2nd unit of PRBCs. When trying to reposition patient, patient got SOB and was using accessory muscles to breath. Patient had to take rest pauses when talking due to SOB. Patient was on 1.5 L O2. This was new to the patient. Vital signs were taken and were stable. Lungs were assessed LLL had crackles in base and R lung was diminished. RRT came to look at patient and ordered CXR. On call triad provider notified on situation and resulting of CXR. Provider ordered one time dose of 40mg  IV lasix and increase oxygen if needed. Patient on 2L O2 now and O2 sats are at 98%. Will continue to monitor.

## 2017-06-21 NOTE — Progress Notes (Signed)
Rehab Admissions Coordinator Note:  Patient was screened by Retta Diones for appropriateness for an Inpatient Acute Rehab Consult.  At this time, we are recommending Inpatient Rehab consult.  Jodell Cipro M 06/21/2017, 3:03 PM  I can be reached at [phone].

## 2017-06-21 NOTE — Evaluation (Signed)
Occupational Therapy Evaluation Patient Details Name: CHELCI WINTERMUTE MRN: [id number] DOB: 04-Jan-1956 Today's Date: 06/21/2017    History of Present Illness  Courtney Stevens is a 61 y.o. 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 On talking to the patient and family at bedside, they report that she has been feeling sob since she was discharged on Monday.    Clinical Impression   Prior to pt's recent surgery, she was independent with all ADL, IADL, and mobility. Pt currently presents very deconditioned after prolonged period of time bed and required overall mod-max assist for toileting and dressing tasks. Pt becomes SOB quickly with activity - educated on breathing strategies and energy conservation principles. Pt will have assistance from multiple family members at discharge and is very motivated to regain her strength and independence. Strongly recommend CIR as pt is motivated and has potential to regain prior level of function. OT will continue to follow acutely.    Follow Up Recommendations  CIR    Equipment Recommendations  Other (comment) (TBD at next venue)    Recommendations for Other Services Rehab consult     Precautions / Restrictions Precautions Precautions: Fall Precaution Comments: L side chest tube Restrictions Weight Bearing Restrictions: No      Mobility Bed Mobility Overal bed mobility: Needs Assistance Bed Mobility: Rolling;Sidelying to Sit;Sit to Sidelying Rolling: Supervision Sidelying to sit: Min guard     Sit to sidelying: Min assist General bed mobility comments: Increased time to get out of bed. Min assist to elevate LE to get back into bed.    Transfers Overall transfer level: Needs assistance Equipment used: Rolling walker (2 wheeled) Transfers: Sit to/from [employer] Sit to Stand: Mod assist Stand pivot transfers: Mod assist       General transfer comment: Assist for boost to stand and cues for safe hand placement and RW use.     Balance Overall balance assessment: Needs assistance Sitting-balance support: Single extremity supported;Feet supported Sitting balance-Leahy Scale: Fair     Standing balance support: Bilateral upper extremity supported;During functional activity Standing balance-Leahy Scale: Poor                             ADL either performed or assessed with clinical judgement   ADL Overall ADL's : Needs assistance/impaired Eating/Feeding: Set up   Grooming: Wash/dry hands;Wash/dry face;Set up;Sitting   Upper Body Bathing: Minimal assistance;Sitting Upper Body Bathing Details (indicate cue type and reason): becomes easily fatigued and SOB Lower Body Bathing: Maximal assistance;Sit to/from stand           Toilet Transfer: Moderate assistance;Stand-pivot;BSC;RW   Toileting- Clothing Manipulation and Hygiene: Moderate assistance;Sit to/from stand       Functional mobility during ADLs: Moderate assistance;Rolling walker       Vision Baseline Vision/History: Wears glasses Wears Glasses: Reading only Patient Visual Report: No change from baseline Vision Assessment?: No apparent visual deficits     Perception     Praxis      Pertinent Vitals/Pain Pain Assessment: No/denies pain     Hand Dominance Right   Extremity/Trunk Assessment Upper Extremity Assessment Upper Extremity Assessment: Generalized weakness  Lower Extremity Assessment Lower Extremity Assessment: Generalized weakness   Cervical / Trunk Assessment Cervical / Trunk Assessment: Normal   Communication Communication Communication: No difficulties   Cognition Arousal/Alertness:  Awake/alert Behavior During Therapy: WFL for tasks assessed/performed Overall Cognitive Status: No family/caregiver present to determine baseline cognitive functioning Area of Impairment: Problem solving                             Problem Solving: Slow processing General Comments: Mildly slowed processing; pt was somewhat lethargic/tired   General Comments       Exercises     Shoulder Instructions      Home Living Family/patient expects to be discharged to:: Private residence Living Arrangements: Spouse/significant other Available Help at Discharge: Family;Available 24 hours/day (Husband, daughter-in-law and sister) Type of Home: House Home Access: Stairs to enter [employer] of Steps: 3 Entrance Stairs-Rails: Right;Left Home Layout: One level     Bathroom Shower/Tub: Occupational psychologist: Handicapped height     Home Equipment: Environmental consultant - 2 wheels;Wheelchair - manual   Additional Comments: Pt's husband works Associate Professor the day, but sister and daughter-in-law will be able to provide assistance      Prior Functioning/Environment Level of Independence: Independent        Comments: Prior to surgery at [facility], pt was independent, drives, and worked as Herbalist Problem List: Decreased strength;Decreased activity tolerance;Impaired balance (sitting and/or standing);Decreased safety awareness;Decreased knowledge of use of DME or AE;Cardiopulmonary status limiting activity      OT Treatment/Interventions: Self-care/ADL training;Therapeutic exercise;Energy conservation;DME and/or AE instruction;Therapeutic activities;Patient/family education;Balance training    OT Goals(Current goals can be found in the care plan section) Acute Rehab OT Goals Patient Stated Goal: get back to being independent OT Goal Formulation: With patient Time For Goal Achievement: 07/05/17 Potential to Achieve Goals: Good ADL Goals Pt Will Perform Grooming:  standing;with supervision Pt Will Perform Upper Body Dressing: sitting;with modified independence Pt Will Perform Lower Body Dressing: with supervision;sit to/from stand Pt Will Transfer to Toilet: with supervision;ambulating;bedside commode (over toilet) Pt Will Perform Toileting - Clothing Manipulation and hygiene: with supervision;sit to/from stand  OT Frequency: Min 3X/week   Barriers to D/C:            Co-evaluation              AM-PAC PT "6 Clicks" Daily Activity     Outcome Measure Help from another person eating meals?: None Help from another person taking care of personal grooming?: None Help from another person toileting, which includes using toliet, bedpan, or urinal?: A Lot Help from another person bathing (including washing, rinsing, drying)?: A Lot Help from another person to put on and taking off regular upper body clothing?: A Little Help from another person to put on and taking off regular lower body clothing?: A Lot 6 Click Score: 17   End of Session Equipment Utilized During Treatment: Gait belt;Rolling walker Nurse Communication: Mobility status  Activity Tolerance: Patient limited by fatigue Patient left: in bed;with call bell/phone within reach  OT Visit Diagnosis: Unsteadiness on feet (R26.81);Muscle weakness (generalized) (M62.81)                Time: 1330-1402 OT Time Calculation (min): 32 min Charges:  OT General Charges $OT Visit: 1 Procedure OT Evaluation $OT Eval Moderate Complexity: 1 Procedure OT Treatments $Self Care/Home Management : 8-22 mins G-Codes:  Redmond Baseman, MS, OTR/L 06/21/2017, 3:46 PM

## 2017-06-21 NOTE — Evaluation (Signed)
Physical Therapy Evaluation Patient Details Name: Courtney Stevens MRN: [id number] DOB: 1956-08-28 Today's Date: 06/21/2017   History of Present Illness   Courtney Stevens is a 61 y.o. 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 On talking to the patient and family at bedside, they report that she has been feeling sob since she was discharged on Monday.   Clinical Impression  Pt admitted with above diagnosis. Pt currently with functional limitations due to the deficits listed below (see PT Problem List). Pt very weak from prolonged time in the bed, required mod A for bed mobility and mod A +2 for sit to stand. Was able to pivot to the chair with 2 person HHA but did not have strength to ambulate yet. VSS.  Pt will benefit from skilled PT to increase their independence and safety with mobility to allow discharge to the venue listed below.       Follow Up Recommendations CIR    Equipment Recommendations  Rolling walker with 5" wheels    Recommendations for Other Services Rehab consult     Precautions / Restrictions Precautions Precautions: Fall Precaution Comments: L side chest tube Restrictions Weight Bearing Restrictions: No      Mobility  Bed Mobility Overal bed mobility: Needs Assistance Bed Mobility: Rolling;Sidelying to Sit Rolling: Supervision Sidelying to sit: Mod assist       General bed mobility comments: pt able to get into SL with use of rail, mod A for SL to sit and scooting hips to EOB  Transfers Overall transfer level: Needs assistance Equipment used: 2 person hand held assist Transfers: Sit to/from [facility] Sit to Stand: Mod  assist;+2 physical assistance Stand pivot transfers: +2 physical assistance;Min assist       General transfer comment: increased time needed to stand, +2 mod HHA for standing and then +2 min A for pivot steps to chair. Stood again from chair to reposition with min A +2  Ambulation/Gait             General Gait Details: pt too fatigued today  Stairs            Wheelchair Mobility    Modified Rankin (Stroke Patients Only)       Balance Overall balance assessment: Needs assistance Sitting-balance support: Single extremity supported Sitting balance-Leahy Scale: Fair     Standing balance support: Bilateral upper extremity supported Standing balance-Leahy Scale: Poor                               Pertinent Vitals/Pain Pain Assessment: No/denies pain    Home Living Family/patient expects to be discharged to:: Private residence Living Arrangements: Spouse/significant other Available Help at Discharge: Family;Available 24 hours/day Type of Home: House Home Access: Stairs to enter Entrance Stairs-Rails: Psychiatric nurse of Steps: 3 Home Layout: One level Home Equipment: None Additional Comments: pt comes in from [employer] where she has been since surgery at [place]    Prior Function Level of Independence: Independent         Comments: prior to sx at [facility], she was independent and working in [place]. Her husband works but  her daughter in law and her sister can both stay with her when he's gone     Hand Dominance        Extremity/Trunk Assessment   Upper Extremity Assessment Upper Extremity Assessment: Generalized weakness    Lower Extremity Assessment Lower Extremity Assessment: Generalized weakness    Cervical / Trunk Assessment Cervical / Trunk Assessment: Normal  Communication   Communication: No difficulties  Cognition Arousal/Alertness: Awake/alert Behavior During Therapy: WFL for tasks  assessed/performed Overall Cognitive Status: Within Functional Limits for tasks assessed                                        General Comments      Exercises     Assessment/Plan    PT Assessment Patient needs continued PT services  PT Problem List Decreased strength;Decreased activity tolerance;Decreased balance;Decreased mobility;Decreased knowledge of use of DME;Decreased knowledge of precautions;Cardiopulmonary status limiting activity;Obesity       PT Treatment Interventions DME instruction;Gait training;Stair training;Functional mobility training;Therapeutic activities;Therapeutic exercise;Balance training;Cognitive remediation    PT Goals (Current goals can be found in the Care Plan section)  Acute Rehab PT Goals Patient Stated Goal: get back to being independent PT Goal Formulation: With patient Time For Goal Achievement: 07/05/17 Potential to Achieve Goals: Good    Frequency Min 3X/week   Barriers to discharge        Co-evaluation               AM-PAC PT "6 Clicks" Daily Activity  Outcome Measure Difficulty turning over in bed (including adjusting bedclothes, sheets and blankets)?: Total Difficulty moving from lying on back to sitting on the side of the bed? : Total Difficulty sitting down on and standing up from a chair with arms (e.g., wheelchair, bedside commode, etc,.)?: Total Help needed moving to and from a bed to chair (including a wheelchair)?: A Lot Help needed walking in hospital room?: A Lot Help needed climbing 3-5 steps with a railing? : A Lot 6 Click Score: 9    End of Session Equipment Utilized During Treatment: Gait belt Activity Tolerance: Patient tolerated treatment well;Patient limited by fatigue Patient left: in chair;with call bell/phone within reach;with chair alarm set Nurse Communication: Mobility status PT Visit Diagnosis: Unsteadiness on feet (R26.81);Muscle weakness (generalized) (M62.81);Other abnormalities  of gait and mobility (R26.89)    Time: 8841-6606 PT Time Calculation (min) (ACUTE ONLY): 23 min   Charges:   PT Evaluation $PT Eval Moderate Complexity: 1 Mod PT Treatments $Therapeutic Activity: 8-22 mins   PT G Codes:        Leighton Roach, PT  Acute Rehab Services  [place] 06/21/2017, 11:46 AM

## 2017-06-22 DIAGNOSIS — R0602 Shortness of breath: Secondary | ICD-10-CM

## 2017-06-22 LAB — BPAM RBC
BLOOD PRODUCT EXPIRATION DATE: 201808292359
BLOOD PRODUCT EXPIRATION DATE: 201808292359
ISSUE DATE / TIME: 201808040008
ISSUE DATE / TIME: 201808032113
Unit Type and Rh: 7300
Unit Type and Rh: 7300

## 2017-06-22 LAB — TYPE AND SCREEN
ABO/RH(D): B POS
ANTIBODY SCREEN: POSITIVE
DAT, IGG: NEGATIVE
Donor AG Type: NEGATIVE
Donor AG Type: NEGATIVE
UNIT DIVISION: 0
UNIT DIVISION: 0

## 2017-06-22 LAB — GLUCOSE, CAPILLARY
GLUCOSE-CAPILLARY: 178 mg/dL — AB (ref 65–99)
Glucose-Capillary: 125 mg/dL — ABNORMAL HIGH (ref 65–99)
Glucose-Capillary: 104 mg/dL — ABNORMAL HIGH (ref 65–99)
Glucose-Capillary: 137 mg/dL — ABNORMAL HIGH (ref 65–99)

## 2017-06-22 NOTE — Progress Notes (Addendum)
PROGRESS NOTE    Courtney Stevens  WJX:[id number] DOB: 1956/06/12 DOA: 06/19/2017 PCP: Unk Pinto, MD    Brief Narrative:Courtney Stevens is a 61 y.o. 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 On talking to the patient and family at bedside, they report that she has been feeling sob since she was discharged on Monday. She denies any chest pain , fever or chills. Nausea, vomiting diarrhea. Or abdominal pain. CXR in ED, revealed rigth lower lobe infiltrate and upper lobe infiltrate/ opacity. She was referred to medical service for further evaluation.   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 CT chest with contrast shows Small right loculated pleural fluid collection evident with gas distributed throughout the fluid/debris in the rightpleural space.. Small layering left pleural effusion evident.There is bibasilar collapse/ consolidation, right greater than left. 3 cm focus of ground-glass attenuation identified in the peripheral left upper lobe. Wean her off the oxygen as appropriate.     2 Chest tubes placed secondary to pneumothorax, one was removed . She was transitioned to pneumostat prior to discharge.  IR consulted , chest tube is draining well.   VRE and MDR E coli and enterobacter aerogenes isolated in empyema on the right  Complete the treatment with zyvox, ertepenam and Eraxis by 8/7.  Further  management as per ID as outpatient.   Anemia of acute illness and blood loss from the chest tube:  She received 2 units of prbc at [place] last week, and her H&H is 7.3.  Transfuse another 2 units of prbcand repeat CBC this am showed hemoglobin about 10.    Anasarca/ ascites:  Dietary consult to improve her nutritional status. Lasix BID.improving.    H/O Cholangiocarcinoma s/p resection of the liver:  Surgical incisions healing appropriately.  Outpatient follow up   Hypertension; well controlled.    Diabetes Mellitus:  CBG (last 3)   Recent Labs  06/22/17 0744 06/22/17 1113 06/22/17 1622  GLUCAP 125* 178* 104*    Resume SSI and lantus. No changes in meds.    Diarrhea:  She has a h/o c diff antigen. She was recently treated for c diff with oral vancomycin.  c diff pcr ordered it was negative.   Recommended PT evaluation, for CIR.  Consult pending.      DVT prophylaxis: (Lovenox/) Code Status: (Full) Family Communication: none at bedside today.  Disposition Plan: CIR vs SNF  In am.   Consultants:   IR    Procedures: CT chest with contrast.    Antimicrobials:ertepenam, zyvox and  Eraxis till 8/7    Subjective: No new complaints,  No chestp ain or sob.  Objective: Vitals:   06/22/17 0605 06/22/17 0608 06/22/17 1115 06/22/17 1624  BP:   119/76 114/80  Pulse: 76 79 86 88  Resp: (!) 22 (!) 28 (!) 25 (!) 21  Temp:   98 F (36.7 C) 98 F (36.7 C)  TempSrc:  Axillary Oral  SpO2: (!) 82% (!) 79% 100% 100%  Weight:      Height:        Intake/Output Summary (Last 24 hours) at 06/22/17 1846 Last data filed at 06/22/17 1114  Gross per 24 hour  Intake              897 ml  Output                0 ml  Net              897 ml   Filed Weights   06/19/17 1244 06/20/17 0455  Weight: 76.2 kg (168 lb) 88.8 kg (195 lb 12.3 oz)    Examination:  General exam: ill looking lady, appears comfortable. On 2 lit of [place] oxygen. No distress.  Respiratory system:  diminished air entry at bases more on the right when compared to the left. No wheezing or rhonchi. Scattered rales.  Cardiovascular system: S1 & S2 heard, KZL.9/3 systolic murmer present. 2+ pedal edema.  Gastrointestinal system: abdominal incision clean, no erythema there is right anterior chest all surgical incision, not draining. Soft abdomen, no tenderness, bowel sounds heard.  Central nervous system: Alert and oriented. No focal neurological deficits. Extremities: Symmetric 5 x 5 power. 2+ pedal edema improving. Cyanosis or clubbing.  Skin: No rashes, lesions or ulcers Psychiatry: Judgement and insight appear normal. Mood & affect appropriate.     Data Reviewed: I have personally reviewed following labs and imaging studies  CBC:  Recent Labs Lab 06/19/17 1306 06/20/17 0752 06/21/17 0630  WBC 6.0 4.8 6.3  NEUTROABS 3.8  --   --   HGB 9.4* 7.3* 10.3*  HCT 27.8* 22.3* 31.2*  MCV 87.7 88.5 88.6  PLT 104* 109* 570*   Basic Metabolic Panel:  Recent Labs Lab 06/19/17 1306 06/20/17 0752 06/21/17 1128  NA 131* 135 134*  K 3.6 3.1* 3.8  CL 97* 104 99*  CO2 24 27 24   GLUCOSE 156* 133* 167*  BUN 13 10 6   CREATININE 1.18* 0.98 1.18*  CALCIUM 8.3* 7.5* 8.3*   GFR: Estimated Creatinine Clearance: 51.2 mL/min (A) (by C-G formula based on SCr of 1.18 mg/dL (H)). Liver Function Tests:  Recent Labs Lab 06/19/17 1306 06/20/17 0752  AST 107* 83*  ALT 47 38  ALKPHOS 825* 599*  BILITOT 1.1 0.8  PROT 7.0 5.5*  ALBUMIN 2.4* 1.9*   No results for input(s): LIPASE, AMYLASE in the last 168 hours. No results for input(s): AMMONIA in the last 168 hours. Coagulation Profile: No results for input(s): INR, PROTIME in the last 168 hours. Cardiac Enzymes: No results for input(s): CKTOTAL, CKMB, CKMBINDEX, TROPONINI in the last 168 hours. BNP (last 3 results) No results for input(s): PROBNP in the last 8760 hours. HbA1C: No results for input(s): HGBA1C in the last 72  hours. CBG:  Recent Labs Lab 06/21/17 1716 06/21/17 2145 06/22/17 0744 06/22/17 1113 06/22/17 1622  GLUCAP 106* 147* 125* 178* 104*   Lipid Profile: No results for input(s): CHOL, HDL, LDLCALC, TRIG, CHOLHDL, LDLDIRECT in the last 72 hours. Thyroid Function Tests: No results for input(s): TSH, T4TOTAL, FREET4, T3FREE, THYROIDAB in the last 72 hours. Anemia Panel: No results for input(s): VITAMINB12, FOLATE, FERRITIN, TIBC, IRON, RETICCTPCT in the last 72 hours. Sepsis Labs: No results for input(s): PROCALCITON, LATICACIDVEN in the last 168 hours.  Recent Results (from the past 240 hour(s))  MRSA PCR Screening     Status: None   Collection Time:  06/19/17  9:15 PM  Result Value Ref Range Status   MRSA by PCR NEGATIVE NEGATIVE Final    Comment:        The GeneXpert MRSA Assay (FDA approved for NASAL specimens only), is one component of a comprehensive MRSA colonization surveillance program. It is not intended to diagnose MRSA infection nor to guide or monitor treatment for MRSA infections.   C difficile quick scan w PCR reflex     Status: None   Collection Time: 06/21/17  5:49 PM  Result Value Ref Range Status   C Diff antigen NEGATIVE NEGATIVE Final   C Diff toxin NEGATIVE NEGATIVE Final   C Diff interpretation No C. difficile detected.  Final         Radiology Studies: Dg Chest Port 1 View  Result Date: 06/21/2017 CLINICAL DATA:  Initial evaluation for acute respiratory distress. EXAM: PORTABLE CHEST 1 VIEW COMPARISON:  Prior radiograph from 06/19/2017. FINDINGS: Stable cardiomegaly. Mediastinal silhouette within normal limits. Left-sided PICC catheter remains in place with tip at the confluence of the brachiocephalic vein and SVC, stable. Right-sided chest tube remains in place. Side hole closely approximates the bony confines of the right hemithorax. Right pleural effusion, extending to the right lung apex, potentially reflecting hydropneumothorax. Dense right  lower lobe opacity, most likely combination of fusion an atelectasis. Left lower lobe opacity similar to previous, most likely atelectasis. Small left pleural effusion again seen. There has been slightly worsened vascular congestion as compared to previous exam, suspicious for developing pulmonary edema. Osseous structures unchanged. IMPRESSION: 1. Right-sided chest tube in similar position, although the side-hole closely approximates the lateral margin of the bony right hemithorax. Bedside check to ensure adequate functioning recommended. 2. Persistent right pleural effusion extending into the right lung apex, similar to previous. Again, finding may reflect an apical hydropneumothorax. 3. Slightly increased pulmonary vascular congestion as compared to previous exam, suspicious for developing pulmonary interstitial edema. 4. Dense bibasilar airspace opacities, likely combination of effusion with atelectasis. Infiltrates not excluded, and could be considered in the correct clinical setting. 5. Small left pleural effusion. Electronically Signed   By: Jeannine Boga M.D.   On: 06/21/2017 04:54        Scheduled Meds: . atorvastatin  20 mg Oral QHS  . feeding supplement (GLUCERNA SHAKE)  237 mL Oral TID BM  . ferrous sulfate  325 mg Oral Daily  . fluticasone  2 spray Each Nare Daily  . furosemide  40 mg Oral BID  . insulin aspart  0-5 Units Subcutaneous QHS  . insulin aspart  0-9 Units Subcutaneous TID WC  . insulin glargine  12 Units Subcutaneous Daily  . levothyroxine  100 mcg Oral QODAY  . levothyroxine  200 mcg Oral QODAY  . linezolid  600 mg Oral Q12H  . lipase/protease/amylase  12,000 Units Oral TID  . pantoprazole  40 mg Oral Daily  . potassium chloride  40 mEq Oral BID  . sodium chloride flush  10-40 mL Intracatheter Q12H  . sucralfate  1 g Oral QID   Continuous Infusions: . sodium chloride    . anidulafungin Stopped (06/21/17 2301)  . ertapenem Stopped (06/21/17 2103)      LOS: 2 days    Time spent: 35 minutes.     Hosie Poisson, MD [facility] Pager [phone] If 7PM-7AM, please contact night-coverage [email or web address] Password [facility] 06/22/2017, 6:46 PM

## 2017-06-23 DIAGNOSIS — E782 Mixed hyperlipidemia: Secondary | ICD-10-CM

## 2017-06-23 DIAGNOSIS — E871 Hypo-osmolality and hyponatremia: Secondary | ICD-10-CM

## 2017-06-23 DIAGNOSIS — D62 Acute posthemorrhagic anemia: Secondary | ICD-10-CM

## 2017-06-23 DIAGNOSIS — R0603 Acute respiratory distress: Secondary | ICD-10-CM

## 2017-06-23 DIAGNOSIS — N183 Chronic kidney disease, stage 3 unspecified: Secondary | ICD-10-CM

## 2017-06-23 DIAGNOSIS — Z8509 Personal history of malignant neoplasm of other digestive organs: Secondary | ICD-10-CM

## 2017-06-23 DIAGNOSIS — N179 Acute kidney failure, unspecified: Secondary | ICD-10-CM

## 2017-06-23 DIAGNOSIS — R0600 Dyspnea, unspecified: Secondary | ICD-10-CM

## 2017-06-23 DIAGNOSIS — Z8709 Personal history of other diseases of the respiratory system: Secondary | ICD-10-CM

## 2017-06-23 LAB — GLUCOSE, CAPILLARY
GLUCOSE-CAPILLARY: 164 mg/dL — AB (ref 65–99)
GLUCOSE-CAPILLARY: 101 mg/dL — AB (ref 65–99)

## 2017-06-23 MED ORDER — VITAMIN D (ERGOCALCIFEROL) 1.25 MG (50000 UNIT) PO CAPS: 50000.0000 [IU] | ORAL_CAPSULE | ORAL | 1 refills | Status: AC

## 2017-06-23 NOTE — Clinical Social Work Placement (Signed)
   CLINICAL SOCIAL WORK PLACEMENT  NOTE  Date:  06/23/2017  Patient Details  Name: Courtney Stevens MRN: [id number] Date of Birth: 1956-01-25  Clinical Social Work is seeking post-discharge placement for this patient at the [place] level of care (*CSW will initial, date and re-position this form in  chart as items are completed):  Yes   Patient/family provided with [place] Work Department's list of facilities offering this level of care within the geographic area requested by the patient (or if unable, by the patient's family).  Yes   Patient/family informed of their freedom to choose among providers that offer the needed level of care, that participate in Medicare, Medicaid or managed care program needed by the patient, have an available bed and are willing to accept the patient.  Yes   Patient/family informed of 's ownership interest in [facility] and [facility], as well as of the fact that they are under no obligation to receive care at these facilities.  PASRR submitted to EDS on       PASRR number received on       Existing PASRR number confirmed on 06/23/17     FL2 transmitted to all facilities in geographic area requested by pt/family on 06/23/17     FL2 transmitted to all facilities within larger geographic area on       Patient informed that his/her managed care company has contracts with or will negotiate with certain facilities, including the following:  [facility]     Yes   Patient/family informed of bed offers received.  Patient chooses bed at [facility]     Physician recommends and patient chooses bed at      Patient to be transferred to [facility] on 06/23/17.  Patient to be transferred to facility by [facility]     Patient family notified on 06/23/17 of transfer.  Name of family member notified:  Venessa Wickham, spouse     PHYSICIAN       Additional Comment:     _______________________________________________ Estanislado Emms, LCSW 06/23/2017, 4:40 PM

## 2017-06-23 NOTE — Progress Notes (Signed)
Physical Therapy Treatment Patient Details Name: Courtney Stevens MRN: [id number] DOB: [birth year] Today's Date: 06/23/2017    History of Present Illness  Courtney Stevens is a 61 y.o. 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 On talking to the patient and family at bedside, they report that she has been feeling sob since she was discharged on Monday.     PT Comments    Initiated short distance ambulation today.  Pt motivated to work with PT.  Pt left with nursing in room.  Con't to recommend CIR.   Follow Up Recommendations  CIR     Equipment Recommendations  Rolling walker with 5" wheels    Recommendations for Other Services       Precautions / Restrictions Precautions Precautions: Fall Precaution Comments: L side chest tube Restrictions Weight Bearing Restrictions: No    Mobility  Bed Mobility Overal bed mobility: Needs Assistance   Rolling: Min guard Sidelying to sit: Min assist       General bed mobility comments: use of bed rail  Transfers Overall transfer level: Needs assistance Equipment used: Rolling walker (2 wheeled) Transfers: Sit to/from Stand Sit to Stand: Min assist         General transfer comment: MIn A to power up from bed and BSC  Ambulation/Gait Ambulation/Gait assistance: Min assist Ambulation Distance (Feet): 12 Feet Assistive device: Rolling walker (2 wheeled) Gait Pattern/deviations: Decreased stride length;Trunk flexed Gait velocity: decreased   General Gait Details: Trunk flexed and needed some A with RW management in tight spaces   Stairs            Wheelchair Mobility    Modified Rankin (Stroke Patients  Only)       Balance   Sitting-balance support: Single extremity supported;Feet supported Sitting balance-Leahy Scale: Fair     Standing balance support: Bilateral upper extremity supported Standing balance-Leahy Scale: Poor                              Cognition Arousal/Alertness: Awake/alert Behavior During Therapy: WFL for tasks assessed/performed Overall Cognitive Status: Within Functional Limits for tasks assessed                                        Exercises      General Comments        Pertinent Vitals/Pain Pain Assessment: No/denies pain    Home Living                      Prior Function            PT Goals (current goals can now be found in the care plan section) Acute Rehab PT Goals Patient Stated Goal: get back to being independent PT Goal Formulation: With patient Time For Goal Achievement: 07/05/17 Potential to Achieve Goals: Good Progress towards PT goals: Progressing toward goals    Frequency    Min 3X/week      PT Plan Current plan remains appropriate    Co-evaluation  AM-PAC PT "6 Clicks" Daily Activity  Outcome Measure  Difficulty turning over in bed (including adjusting bedclothes, sheets and blankets)?: A Lot Difficulty moving from lying on back to sitting on the side of the bed? : Total Difficulty sitting down on and standing up from a chair with arms (e.g., wheelchair, bedside commode, etc,.)?: Total Help needed moving to and from a bed to chair (including a wheelchair)?: A Little Help needed walking in hospital room?: A Little Help needed climbing 3-5 steps with a railing? : A Lot 6 Click Score: 12    End of Session   Activity Tolerance: Patient tolerated treatment well Patient left: in chair;with call bell/phone within reach;with nursing/sitter in room Nurse Communication: Mobility status PT Visit Diagnosis: Unsteadiness on feet (R26.81);Muscle weakness  (generalized) (M62.81);Other abnormalities of gait and mobility (R26.89)     Time: 7471-5953 PT Time Calculation (min) (ACUTE ONLY): 17 min  Charges:  $Gait Training: 8-22 mins                    G Codes:       Tristen Luce L. Tamala Julian, [place] Pager [phone] 06/23/2017    Galen Manila 06/23/2017, 12:43 PM

## 2017-06-23 NOTE — Care Management Note (Signed)
Case Management Note  Patient Details  Name: ROMINA DIVIRGILIO MRN: [id number] Date of Birth: 1956-02-10  Subjective/Objective:     Cholangiocarcinoma s/p hepatectomy, empyema with chest tube placement draining serosanguinous fluid, was found to have VRE and MDR Ecoli in the empyema, was dc to SNF- [facility] on IV abx, readmitted on 8/2,  will return to SNF with chest tube and mini express and IV abx          Action/Plan: Discharge Planning: Chart reviewed. CSW following for SNF placement. Scheduled dc today.  PCP Unk Pinto MD    Expected Discharge Date:                 Expected Discharge Plan:  [place]  In-House Referral:  Clinical Social Work  Discharge planning Services  CM Consult  Post Acute Care Choice:  NA Choice offered to:  NA  DME Arranged:  N/A DME Agency:  NA  HH Arranged:  NA HH Agency:  NA  Status of Service:  Completed, signed off  If discussed at [place] of Stay Meetings, dates discussed:    Additional Comments:  Erenest Rasher, RN 06/23/2017, 11:12 AM

## 2017-06-23 NOTE — Progress Notes (Signed)
Patient will discharge to [facility] Anticipated discharge date: 06/23/17 Family notified: Suraiya Dickerson, spouse Transportation by: Corey Harold   CSW signing off.  Estanislado Emms, [place]  Clinical Social Worker

## 2017-06-23 NOTE — Consult Note (Signed)
Physical Medicine and Rehabilitation Consult Reason for Consult: Decreased functional mobility Referring Physician: [facility]ad   HPI: Courtney Stevens is a 61 y.o. right hand female with complicated medical history significant cholangiocarcinoma status post hepatectomy, empyema with chest tube placement for pneumothorax, VRE as well as history of CAD, ?CKD stage III, diabetes mellitus. History taken from chart review and patient. She was discharged from [facility] to [facility] healthcare 06/09/2017. Prior to hospital admission to [facility] patient was independent living with spouse working in [place]. Her husband works but a daughter-in-law's sister can provide assistance. One level home with 3 steps to entry. Presented from [facility] 06/19/2017 with episode of choking as well as hypoxia. Chest x-ray showed right lower lobe infiltrate and upper lobe infiltrate opacity. Noted anemia 7.3 she was transfused. She remains on contact precautions. Physical occupational therapy evaluations completed for debilitation 06/21/2017 and feel patient very motivated to regain prior level of function prior to admission with recommendations of physical medicine rehabilitation consult.   Review of Systems  Constitutional: Negative for chills.  Respiratory: Positive for cough and shortness of breath.   Cardiovascular: Positive for leg swelling.  Gastrointestinal: Positive for constipation and nausea. Negative for vomiting.       GERD  Genitourinary: Negative for dysuria, flank pain and hematuria.  Musculoskeletal: Positive for joint pain and myalgias.  Neurological: Positive for weakness. Negative for seizures.  Psychiatric/Behavioral:       Anxiety  All other systems reviewed and are negative.  Past Medical History:  Diagnosis Date  . Anemia    History of blood transfusion which is when pt reports being exposed to hepatitis in 2009.  Marland Kitchen Anxiety   . Asthma   . CAD (coronary artery disease)    a.  03/2016 St [facility]: nl EF, no wma-->Nl study;  b. 11/13/16 NSTEMI/Cath: EF 55-65%, 40% prox RCA, 20% prox-mid LAD, 90% D2 lesion. The culprit for the NSTEMI appeared to be the 90% ostial stenosis in second diagonal which is a 2 mm in diameter and supplies a relatively small territory. Managed medically.  . 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 CKD (chronic kidney disease), stage III   . Diabetes mellitus   . GERD (gastroesophageal reflux disease)   . H/O gastric bypass   . Hemorrhoid   . Hepatitis   . Hyperlipidemia   . Hypertension   . Hypothyroid   . Liver tumor   . PVC's (premature ventricular contractions)   . Q fever 2015   Past Surgical History:  Procedure Laterality Date  . ABDOMINAL HYSTERECTOMY  1990   BIL OOPHORECTOMY  . APPENDECTOMY    . Bile Duct Resection  2009   [place]  . BREAST SURGERY     BIOPSY NEG  . BUNIONECTOMY Right 2005  . CARDIAC CATHETERIZATION N/A 11/13/2016   Procedure: Left Heart Cath and Coronary Angiography;  Surgeon: Wellington Hampshire, MD;  Location: [facility];  Service: Cardiovascular;  Laterality: N/A;  . CHOLECYSTECTOMY  1992  . GASTRIC BYPASS  2004  . TONSILLECTOMY    . TUBAL LIGATION    . VIDEO BRONCHOSCOPY WITH ENDOBRONCHIAL ULTRASOUND N/A 10/12/2014   Procedure: VIDEO BRONCHOSCOPY WITH ENDOBRONCHIAL ULTRASOUND;  Surgeon: Brand Males, MD;  Location: [facility] OR;  Service: Thoracic;  Laterality: N/A;   Family History  Problem Relation Age of Onset  . Colon cancer Mother   . Hypertension Mother   . Glaucoma Mother   .  Stroke Mother   . Diabetes Father   . Alzheimer's disease Father   . Heart attack Father 66  . Nephrolithiasis Father   . Emphysema Father        was a smoker  . Heart disease Father   . Nephrolithiasis Sister   . Heart attack Brother 3  . Nephrolithiasis Brother   . Neuropathy Neg Hx    Social History:  reports that she quit smoking about 39 years ago. Her smoking use included Cigarettes. She has a  10.00 pack-year smoking history. She has never used smokeless tobacco. She reports that she drinks about 0.6 oz of alcohol per week . She reports that she does not use drugs. Allergies:  Allergies  Allergen Reactions  . Ace Inhibitors Anaphylaxis  . Tilade [Nedocromil] Anaphylaxis  . Reglan [Metoclopramide] Nausea And Vomiting  . Betadine [Povidone Iodine] Rash    iching    Medications Prior to Admission  Medication Sig Dispense Refill  . aspirin EC 81 MG EC tablet Take 1 tablet (81 mg total) by mouth daily. 90 tablet 3  . atorvastatin (LIPITOR) 20 MG tablet Take 20 mg by mouth at bedtime.    . cetirizine (ZYRTEC) 10 MG tablet Take 10 mg by mouth daily.     Marland Kitchen CREON 12000 units CPEP capsule TAKE 1 CAPSULE THREE TIMES A DAY (Patient taking differently: TAKE 1 CAPSULE (12000 UNITS) BY MOUTH THREE TIMES A DAY) 270 capsule 1  . ertapenem (INVANZ) IVPB Inject 1 g into the vein daily. 10 day course started 06/17/17    . ferrous sulfate 325 (65 FE) MG tablet Take 325 mg by mouth daily.    . fluticasone (FLONASE) 50 MCG/ACT nasal spray PLACE 2 SPRAYS INTO BOTH NOSTRILS DAILY. (Patient taking differently: Place 2 sprays into both nostrils daily. ) 48 g 1  . furosemide (LASIX) 40 MG tablet TAKE 1 TO 2 TABLETS TWICE A DAY FOR FLUID AND SWELLING (Patient taking differently: TAKE  1 TABLET (40 MG) TWICE A DAY (9AM AND 5PM) FOR FLUID AND SWELLING) 360 tablet 1  . gabapentin (NEURONTIN) 100 MG capsule TAKE 1-3 CAPSULES BY MOUTH DAILY FOR NEURITIS PAIN (Patient taking differently: TAKE 1 CAPSULE (100 MG) BY MOUTH TWICE DAILY (9AM AND 5PM) FOR NEURITIS PAIN) 270 capsule 1  . glucagon (GLUCAGON EMERGENCY) 1 MG injection Inject 1 mg into the muscle once as needed (hypoglycemia).    . insulin glargine (LANTUS) 100 unit/mL SOPN Inject 15 units daily or as directed. (Patient taking differently: Inject 12 Units into the skin daily. ) 30 mL 3  . insulin lispro (HUMALOG) 100 UNIT/ML injection Inject 0-10 Units into the  skin See admin instructions. Inject 0-10 units subcutaneously before meals and at bedtime per sliding scale: CBG 0-150 0 units, 151-200 2 units, 201-250 4 units 251-300 6 units, 301-350 8 units, 351-400 10 units, >401 call MD    . levothyroxine (SYNTHROID, LEVOTHROID) 100 MCG tablet Take 100-200 mcg by mouth See admin instructions. Take 1 tablet (100 mcg) by mouth every other day alternating with 2 tablets (200 mcg) on other days    . micafungin (MYCAMINE) 100 MG SOLR injection Inject 50 mg into the vein daily.    . Multiple Vitamins-Minerals (MULTIVITAMIN WITH MINERALS) tablet Take 1 tablet by mouth daily.     . nitroGLYCERIN (NITROSTAT) 0.4 MG SL tablet Place 1 tablet (0.4 mg total) under the tongue every 5 (five) minutes as needed for chest pain. Up to 3 doses (Patient taking differently: Place  0.4 mg under the tongue every 5 (five) minutes as needed for chest pain (up to 3 doses). ) 25 tablet 3  . oxyCODONE (OXY IR/ROXICODONE) 5 MG immediate release tablet Take 5-10 mg by mouth every 4 (four) hours as needed (1 tablet pain 3-5/ 2 tablets pain 5-10).    . pantoprazole (PROTONIX) 40 MG tablet TAKE 1 TABLET DAILY (Patient taking differently: TAKE 1 TABLET (40 MG BY MOUTH DAILY) 90 tablet 1  . sodium chloride (OCEAN) 0.65 % SOLN nasal spray Place 1 spray into both nostrils every 2 (two) hours as needed for congestion.    . sucralfate (CARAFATE) 1 g tablet TAKE 1 TABLET FOUR TIMES A DAY (Patient taking differently: TAKE 1 TABLET (1 G) BY MOUTH FOUR TIMES A DAY - 9AM, 1PM, 5PM, 9PM) 360 tablet 1  . Vitamin D, Ergocalciferol, (DRISDOL) 50000 units CAPS capsule TAKE 1 CAPSULE DAILY (Patient taking differently: TAKE 1 CAPSULE (50,000 UNITS) BY MOUTH DAILY) 90 capsule 1  . albuterol (VENTOLIN HFA) 108 (90 BASE) MCG/ACT inhaler Inhale 2 puffs into the lungs every 4 (four) hours as needed for wheezing or shortness of breath. (Patient not taking: Reported on 06/19/2017) 1 Inhaler 4  . ALPRAZolam (XANAX) 1 MG tablet  1/2 to 1 tablet 3 x / day if needed for Anxiety (Patient not taking: Reported on 06/19/2017) 90 tablet 5  . atorvastatin (LIPITOR) 40 MG tablet Take 1 tablet ( 20 mg ) daily (Patient not taking: Reported on 06/19/2017) 90 tablet 1  . Blood Glucose Monitoring Suppl (ONE TOUCH ULTRA SYSTEM KIT) w/Device KIT 1 kit by Does not apply route once. 1 each 0  . calcium citrate-vitamin D (CITRACAL+D) 315-200 MG-UNIT per tablet Take 1 tablet by mouth 3 (three) times daily.    . cyclobenzaprine (FLEXERIL) 10 MG tablet TAKE 1/2 TO 1 TABLET BY MOUTH 3 TIMES A DAY AS NEEDED FOR MUSCLE SPASMS (Patient not taking: Reported on 06/19/2017) 90 tablet 0  . FREESTYLE LITE test strip USE TO CHECK BLOOD SUGAR 3 TIMES A DAY DUE TO POORLY CONTROLLED DIABETES. DX E11.65 100 each 3  . glipiZIDE (GLUCOTROL) 5 MG tablet TAKE ONE-HALF (1/2) TO ONE TABLET THREE TIMES A DAY IF NEEDED FOR ELEVATED GLUCOSE (Patient not taking: Reported on 06/19/2017) 270 tablet 1  . Insulin Pen Needle 32G X 4 MM MISC Inject insulin 1-3 times daily SQ 100 each 4  . isosorbide mononitrate (IMDUR) 60 MG 24 hr tablet Take 1 tablet (60 mg total) by mouth daily. (Patient not taking: Reported on 06/19/2017) 90 tablet 3  . Lancets (ONETOUCH ULTRASOFT) lancets Check blood sugar 3 times daily due to poorly controlled glucose-DX-E11.65. 100 each 12  . levothyroxine (SYNTHROID, LEVOTHROID) 200 MCG tablet TAKE ONE-HALF (1/2) TO ONE TABLET DAILY BEFORE BREAKFAST ALTERNATE TAKING 100 MCG AND 200 MCG (Patient not taking: Reported on 06/19/2017) 90 tablet 1  . linezolid (ZYVOX) 600 MG tablet Take 600 mg by mouth every 12 (twelve) hours. 10 day course ordered 06/16/17    . metFORMIN (GLUCOPHAGE XR) 500 MG 24 hr tablet Take 4 tablets daily for Diabetes (Patient not taking: Reported on 06/19/2017) 360 tablet 1  . metoCLOPramide (REGLAN) 10 MG tablet Take 1 tablet at bedtime for Reflux (Patient not taking: Reported on 06/19/2017) 30 tablet 2  . metoprolol tartrate (LOPRESSOR) 25 MG tablet  Take 1 tablet (25 mg total) by mouth 2 (two) times daily. (Patient not taking: Reported on 06/19/2017) 180 tablet 1  . triamcinolone cream (KENALOG) 0.1 % Apply  1 application topically 2 (two) times daily. (Patient not taking: Reported on 06/19/2017) 30 g 0    Home: Home Living Family/patient expects to be discharged to:: Private residence Living Arrangements: Spouse/significant other Available Help at Discharge: Family, Available 24 hours/day (Husband, daughter-in-law and sister) Type of Home: House Home Access: Stairs to enter Technical brewer of Steps: 3 Entrance Stairs-Rails: Right, Left Home Layout: One level Bathroom Shower/Tub: Multimedia programmer: Handicapped height Home Equipment: Environmental consultant - 2 wheels, Wheelchair - manual Additional Comments: Pt's husband works Associate Professor the day, but sister and daughter-in-law will be able to provide assistance  Functional History: Prior Function Level of Independence: Independent Comments: Prior to surgery at [facility], pt was independent, drives, and worked as Immunologist Status:  Mobility: Bed Mobility Overal bed mobility: Needs Assistance Bed Mobility: Rolling, Sidelying to Sit, Sit to Sidelying Rolling: Supervision Sidelying to sit: Min guard Sit to sidelying: [place] bed mobility comments: Increased time to get out of bed. Min assist to elevate LE to get back into bed.  Transfers Overall transfer level: Needs assistance Equipment used: Rolling walker (2 wheeled) Transfers: Sit to/from Stand, [employer] Transfers Sit to Stand: Mod assist Stand pivot transfers: Mod assist General transfer comment: Assist for boost to stand and cues for safe hand placement and RW use.  Ambulation/Gait General Gait Details: pt too fatigued today    ADL: ADL Overall ADL's : Needs assistance/impaired Eating/Feeding: Set up Grooming: Wash/dry hands, Wash/dry face, Set up, Sitting Upper Body Bathing: Minimal assistance,  Sitting Upper Body Bathing Details (indicate cue type and reason): becomes easily fatigued and SOB Lower Body Bathing: Maximal assistance, Sit to/from stand Toilet Transfer: Moderate assistance, Stand-pivot, BSC, RW Toileting- Clothing Manipulation and Hygiene: Moderate assistance, Sit to/from stand Functional mobility during ADLs: Moderate assistance, Rolling walker  Cognition: Cognition Overall Cognitive Status: No family/caregiver present to determine baseline cognitive functioning Orientation Level: Oriented X4 Cognition Arousal/Alertness: Awake/alert Behavior During Therapy: WFL for tasks assessed/performed Overall Cognitive Status: No family/caregiver present to determine baseline cognitive functioning Area of Impairment: Problem solving Problem Solving: Slow processing General Comments: Mildly slowed processing; pt was somewhat lethargic/tired  Blood pressure 128/62, pulse 87, temperature 97.8 F (36.6 C), temperature source Oral, resp. rate 18, height '5\' 2"'  (1.575 m), weight 88.8 kg (195 lb 12.3 oz), SpO2 100 %. Physical Exam  Vitals reviewed. Constitutional: She is oriented to person, place, and time. She appears well-developed.  61 year old right handed ill-appearing female Obese  HENT:  Head: Normocephalic and atraumatic.  Eyes: EOM are normal. Right eye exhibits no discharge. Left eye exhibits no discharge.  Neck: Normal range of motion. Neck supple. No thyromegaly present.  Cardiovascular: Normal rate, regular rhythm and normal heart sounds.   Respiratory:  Decreased breath sounds at the bases +[facility]  GI: Soft. Bowel sounds are normal. She exhibits distension.  Musculoskeletal: She exhibits edema. She exhibits no tenderness.  Neurological: She is alert and oriented to person, place, and time.  Motor: B/l UE 4/5 proximal to distal B/l LE: HF 4-/5, distally 4/5  Skin: Skin is warm and dry.  A pleural catheter right posterior thorax in place  Psychiatric: She has  a normal mood and affect. Her behavior is normal.    Results for orders placed or performed during the hospital encounter of 06/19/17 (from the past 24 hour(s))  Glucose, capillary     Status: Abnormal   Collection Time: 06/22/17  7:44 AM  Result Value Ref Range   Glucose-Capillary 125 (  H) 65 - 99 mg/dL  Glucose, capillary     Status: Abnormal   Collection Time: 06/22/17 11:13 AM  Result Value Ref Range   Glucose-Capillary 178 (H) 65 - 99 mg/dL  Glucose, capillary     Status: Abnormal   Collection Time: 06/22/17  4:22 PM  Result Value Ref Range   Glucose-Capillary 104 (H) 65 - 99 mg/dL  Glucose, capillary     Status: Abnormal   Collection Time: 06/22/17  9:49 PM  Result Value Ref Range   Glucose-Capillary 137 (H) 65 - 99 mg/dL   No results found.  Assessment/Plan: Diagnosis: Debility Labs independently reviewed.  Records reviewed and summated above.  1. Does the need for close, 24 hr/day medical supervision in concert with the patient's rehab needs make it unreasonable for this patient to be served in a less intensive setting? Yes  2. Co-Morbidities requiring supervision/potential complications: ABLA (transfuse if necessary to ensure appropriate perfusion for increased activity tolerance), cholangiocarcinoma status post hepatectomy, empyema with chest tube placement for pneumothorax, VRE (cont abx), AKI on ?CKD (avoid nephrotoxic meds), diabetes mellitus (Monitor in accordance with exercise and adjust meds as necessary), Hyponatremia (cont to monitor, treat if necessary) 3. Due to bowel management, safety, disease management and patient education, does the patient require 24 hr/day rehab nursing? Yes 4. Does the patient require coordinated care of a physician, rehab nurse, PT (1-2 hrs/day, 5 days/week) and OT (1-2 hrs/day, 5 days/week) to address physical and functional deficits in the context of the above medical diagnosis(es)? Yes Addressing deficits in the following areas: balance,  endurance, locomotion, strength, transferring, bowel/bladder control, bathing, dressing, toileting and psychosocial support 5. Can the patient actively participate in an intensive therapy program of at least 3 hrs of therapy per day at least 5 days per week? Potentially 6. The potential for patient to make measurable gains while on inpatient rehab is excellent 7. Anticipated functional outcomes upon discharge from inpatient rehab are supervision and min assist  with PT, supervision and min assist with OT, n/a with SLP. 8. Estimated rehab length of stay to reach the above functional goals is: 16-19 days. 9. Anticipated D/C setting: ?Home 10. Anticipated post D/C treatments: HH therapy and Home excercise program 11. Overall Rehab/Functional Prognosis: good  RECOMMENDATIONS: This patient's condition is appropriate for continued rehabilitative care in the following setting: CIR if patient able to tolerate 3 hours therapy/day. Patient has agreed to participate in recommended program. Potentially Note that insurance prior authorization may be required for reimbursement for recommended care.  Comment: Rehab Admissions Coordinator to follow up.  Delice Lesch, MD, Mellody Drown Cathlyn Parsons., PA-C 06/23/2017

## 2017-06-23 NOTE — Discharge Summary (Addendum)
Physician Discharge Summary  ROSHAN SALAMON ZOX:[id number] DOB: 1956/05/19 DOA: 06/19/2017  PCP: Unk Pinto, MD  Admit date: 06/19/2017 Discharge date: 06/23/2017  Admitted From: SNF Disposition:  SNF  Recommendations for Outpatient Follow-up:  1. Follow up with PCP in 1-2 weeks 2. Please obtain BMP/CBC in one week 3. Please follow up with cardiothoracic surgery as recommended in less than a week.  4. Follow up with ID at [facility] as her antibiotics will be completed soon.  5. Please use oxygen at 2lit/min  to keep your oxygen sats >90%.    Discharge Condition:stable.  CODE STATUS:full code.  Diet recommendation: regular diet.   Brief/Interim Summary: BLESSING ZAUCHA a 61 y.o.femalewith medical history significant for cholangiocarcinoma s/p hepatectomy, empyema with chest tube placement draining serosanguinous fluid, (has her care with [facility]), was found to have VRE and MDR Ecoli in the empyema, was Discharged to SNF with antibiotics to complete by 8/7, was brought by EMS from SNF after an episode of choking and hypoxia following it. On talking to the patient and family at bedside, they report that she has been feeling sob since she was discharged on Monday. She denies any chest pain , fever or chills. Nausea, vomiting diarrhea. Or abdominal pain. CXR in ED, revealed rigth lower lobe infiltrate and upper lobe infiltrate/ opacity. She was referred to medical service for further evaluation.   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 CT chest  with contrast shows Small right loculated pleural fluid collection evident with gas distributed throughout the fluid/debris in the rightpleural space.. Small layering left pleural effusion evident.There is bibasilar collapse/ consolidation, right greater than left. 3 cm focus of ground-glass attenuation identified in the peripheral left upper lobe. We tried to wean her off the oxygen, but couldn't wean her off, since she is afebrile and there is no leukocytosis, would recommend to continue with 2 lit of [place] oxygen and continue with antibiotics as originally prescribed and follow up with her ID and cardiothoracic surgery appt's at [facility].    2 Chest tubes placed secondary to pneumothorax, one was removed . She was transitioned to pneumostat prior to discharge.  IR consulted , chest tube is draining well.   VRE and MDR E coli and enterobacter aerogenes isolated in empyema on the right  Complete the treatment with zyvox, ertepenam and Eraxis by 8/7.   Further management as per ID as outpatient.   Anemia of acute illness and blood loss from the chest tube:  She received 2 units of prbc at [place] last week, and her H&H is 7.3 .  Transfused another 2 units of prbc and repeat CBC showed hemoglobin about 10.    Anasarca/ ascites:  Dietary consult to improve her nutritional status. Lasix BID.improving.    H/O Cholangiocarcinoma s/p resection of the liver:  Surgical incisions healing appropriately.  Outpatient follow up   Hypertension; well controlled.    Diabetes Mellitus:  CBG (last 3)   CBG (last 3)   Recent Labs  06/22/17 1622 06/22/17 2149 06/23/17  [place] SSI and lantus. No changes in meds.    Diarrhea:  She has a h/o c diff antigen. She was recently treated for c diff with oral vancomycin.  c diff pcr ordered it was negative. Possibly antibiotic associated diarrhea.  Improving.       Discharge Instructions  Discharge  Instructions    Diet - low sodium heart healthy    Complete by:  As directed    Discharge instructions    Complete by:  As directed    Please follow up with cardiothoracic surgeon at [facility] in one week regarding the chest tube.  Please follow up with the rest of the appointments at [facility] later this week.     Allergies as of 06/23/2017      Reactions   Ace Inhibitors Anaphylaxis   Tilade [nedocromil] Anaphylaxis   Reglan [metoclopramide] Nausea And Vomiting   Betadine [povidone Iodine] Rash   iching       Medication List    STOP taking these medications   albuterol 108 (90 Base) MCG/ACT inhaler Commonly known as:  VENTOLIN HFA   cyclobenzaprine 10 MG tablet Commonly known as:  FLEXERIL   glipiZIDE 5 MG tablet Commonly known as:  GLUCOTROL   isosorbide mononitrate 60 MG 24 hr tablet Commonly known as:  IMDUR   metFORMIN 500 MG 24 hr tablet Commonly known as:  GLUCOPHAGE XR   metoCLOPramide 10 MG tablet Commonly known as:  REGLAN   metoprolol tartrate 25 MG tablet Commonly known as:  LOPRESSOR   triamcinolone cream 0.1 % Commonly known as:  KENALOG     TAKE these medications   ALPRAZolam 1 MG tablet Commonly known as:  XANAX 1/2 to 1 tablet 3 x / day if needed for Anxiety   aspirin 81 MG EC tablet Take 1 tablet (81 mg total) by mouth daily.   atorvastatin 20 MG tablet Commonly known as:  LIPITOR Take 20 mg by mouth at bedtime. What changed:  Another medication with the same name was removed. Continue taking this medication, and follow the directions you see here.   calcium citrate-vitamin D 315-200 MG-UNIT tablet Commonly known as:  CITRACAL+D Take 1 tablet by mouth 3 (three) times daily.   cetirizine 10 MG tablet Commonly known as:  ZYRTEC Take 10 mg by mouth daily.   CREON 12000 units Cpep capsule Generic drug:  lipase/protease/amylase TAKE 1 CAPSULE THREE TIMES A DAY What changed:  See the new instructions.   ertapenem IVPB Commonly known as:   INVANZ Inject 1 g into the vein daily. 10 day course started 06/17/17   ferrous sulfate 325 (65 FE) MG tablet Take 325 mg by mouth daily.   fluticasone 50 MCG/ACT nasal spray Commonly known as:  FLONASE PLACE 2 SPRAYS INTO BOTH NOSTRILS DAILY. What changed:  how much to take  how to take this  when to take this  additional instructions   FREESTYLE LITE test strip Generic drug:  glucose blood USE TO CHECK BLOOD SUGAR 3 TIMES A DAY DUE TO POORLY CONTROLLED DIABETES. DX E11.65   furosemide 40 MG tablet Commonly known as:  LASIX TAKE 1 TO 2 TABLETS TWICE A DAY FOR FLUID AND SWELLING What changed:  See the new instructions.   gabapentin 100 MG capsule Commonly known as:  NEURONTIN TAKE 1-3 CAPSULES BY MOUTH DAILY FOR NEURITIS PAIN What changed:  See the new instructions.   GLUCAGON EMERGENCY 1 MG injection  Generic drug:  glucagon Inject 1 mg into the muscle once as needed (hypoglycemia).   insulin glargine 100 unit/mL Sopn Commonly known as:  LANTUS Inject 15 units daily or as directed. What changed:  how much to take  how to take this  when to take this  additional instructions   insulin lispro 100 UNIT/ML injection Commonly known as:  HUMALOG Inject 0-10 Units into the skin See admin instructions. Inject 0-10 units subcutaneously before meals and at bedtime per sliding scale: CBG 0-150 0 units, 151-200 2 units, 201-250 4 units 251-300 6 units, 301-350 8 units, 351-400 10 units, >401 call MD   Insulin Pen Needle 32G X 4 MM Misc Inject insulin 1-3 times daily SQ   levothyroxine 100 MCG tablet Commonly known as:  SYNTHROID, LEVOTHROID Take 100-200 mcg by mouth See admin instructions. Take 1 tablet (100 mcg) by mouth every other day alternating with 2 tablets (200 mcg) on other days   levothyroxine 200 MCG tablet Commonly known as:  SYNTHROID, LEVOTHROID TAKE ONE-HALF (1/2) TO ONE TABLET DAILY BEFORE BREAKFAST ALTERNATE TAKING 100 MCG AND 200 MCG   linezolid  600 MG tablet Commonly known as:  ZYVOX Take 600 mg by mouth every 12 (twelve) hours. 10 day course ordered 06/16/17   micafungin 100 MG Solr injection Commonly known as:  MYCAMINE Inject 50 mg into the vein daily.   multivitamin with minerals tablet Take 1 tablet by mouth daily.   nitroGLYCERIN 0.4 MG SL tablet Commonly known as:  NITROSTAT Place 1 tablet (0.4 mg total) under the tongue every 5 (five) minutes as needed for chest pain. Up to 3 doses What changed:  reasons to take this  additional instructions   ONE TOUCH ULTRA SYSTEM KIT w/Device Kit 1 kit by Does not apply route once.   onetouch ultrasoft lancets Check blood sugar 3 times daily due to poorly controlled glucose-DX-E11.65.   oxyCODONE 5 MG immediate release tablet Commonly known as:  Oxy IR/ROXICODONE Take 5-10 mg by mouth every 4 (four) hours as needed (1 tablet pain 3-5/ 2 tablets pain 5-10).   pantoprazole 40 MG tablet Commonly known as:  PROTONIX TAKE 1 TABLET DAILY What changed:  See the new instructions.   sodium chloride 0.65 % Soln nasal spray Commonly known as:  OCEAN Place 1 spray into both nostrils every 2 (two) hours as needed for congestion.   sucralfate 1 g tablet Commonly known as:  CARAFATE TAKE 1 TABLET FOUR TIMES A DAY What changed:  See the new instructions.   Vitamin D (Ergocalciferol) 50000 units Caps capsule Commonly known as:  DRISDOL Take 1 capsule (50,000 Units total) by mouth every 7 (seven) days. What changed:  See the new instructions.      Follow-up Information    Unk Pinto, MD. Schedule an appointment as soon as possible for a visit in 1 week(s).   Specialty:  Internal Medicine Why:  post hospitalization follow up. Contact information: [address] [place] [address] [phone]          Allergies  Allergen Reactions  . Ace Inhibitors Anaphylaxis  . Tilade [Nedocromil] Anaphylaxis  . Reglan [Metoclopramide] Nausea And  Vomiting  . Betadine [Povidone Iodine] Rash    iching     Consultations:  IR     Procedures/Studies: Ct Chest W Contrast  Result Date: 06/19/2017 CLINICAL DATA:  Shortness of breath and acute respiratory illness. EXAM: CT CHEST WITH CONTRAST TECHNIQUE: Multidetector CT imaging of the chest was performed  during intravenous contrast administration. CONTRAST:  <See Chart> ISOVUE-300 IOPAMIDOL (ISOVUE-300) INJECTION 61% COMPARISON:  05/09/2017 FINDINGS: Cardiovascular: Heart size is upper normal. No pericardial effusion. Coronary artery calcification is noted. Atherosclerotic calcification is noted in the wall of the thoracic aorta. Mediastinum/Nodes: New mild right supraclavicular lymphadenopathy is identified with 9 mm short axis lymph node visible on image 20 of series 3. 12 mm short axis high right paratracheal lymph node is seen on image 28 of series 3. 13 mm subcarinal short axis lymph node was 9 mm previously. No left hilar lymphadenopathy. No discrete lymphadenopathy in the right hilum. There is no axillary lymphadenopathy. Lungs/Pleura: Small right loculated pleural fluid collection evident with gas distributed throughout the fluid/debris in the right pleural space. A right-sided chest tube is identified in the posterior right pleural space. Small layering left pleural effusion evident. There is bibasilar collapse/ consolidation, right greater than left. 3 cm focus of ground-glass attenuation identified in the peripheral left upper lobe. Upper Abdomen: Pneumobilia again noted. Surgical line along the posterior right liver compatible with right hepatectomy there is gas along the resection margin of the liver which appears to be subdiaphragmatic. Surgical changes evident in the proximal stomach. Scarring in the right kidney is incompletely visualized. Small to moderate volume ascites evident. Musculoskeletal: Similar appearance of scattered sclerotic foci in the right humerus and scapula.  IMPRESSION: 1. Status post right hepatectomy. There is debris and gas along the resection margin of the liver which appears to be subdiaphragmatic and raises concern for abscess. 2. Right-sided chest tube with complex fluid/debris and gas in the right pleural space. Empyema would be a consideration. 3. Right supraclavicular lymphadenopathy with mild mediastinal lymphadenopathy. 4. Bibasilar collapse/consolidation, right greater than left with ground-glass attenuation peripheral left upper lobe suggesting infection/inflammation. Electronically Signed   By: Misty Stanley M.D.   On: 06/19/2017 17:25   Dg Chest Port 1 View  Result Date: 06/21/2017 CLINICAL DATA:  Initial evaluation for acute respiratory distress. EXAM: PORTABLE CHEST 1 VIEW COMPARISON:  Prior radiograph from 06/19/2017. FINDINGS: Stable cardiomegaly. Mediastinal silhouette within normal limits. Left-sided PICC catheter remains in place with tip at the confluence of the brachiocephalic vein and SVC, stable. Right-sided chest tube remains in place. Side hole closely approximates the bony confines of the right hemithorax. Right pleural effusion, extending to the right lung apex, potentially reflecting hydropneumothorax. Dense right lower lobe opacity, most likely combination of fusion an atelectasis. Left lower lobe opacity similar to previous, most likely atelectasis. Small left pleural effusion again seen. There has been slightly worsened vascular congestion as compared to previous exam, suspicious for developing pulmonary edema. Osseous structures unchanged. IMPRESSION: 1. Right-sided chest tube in similar position, although the side-hole closely approximates the lateral margin of the bony right hemithorax. Bedside check to ensure adequate functioning recommended. 2. Persistent right pleural effusion extending into the right lung apex, similar to previous. Again, finding may reflect an apical hydropneumothorax. 3. Slightly increased pulmonary  vascular congestion as compared to previous exam, suspicious for developing pulmonary interstitial edema. 4. Dense bibasilar airspace opacities, likely combination of effusion with atelectasis. Infiltrates not excluded, and could be considered in the correct clinical setting. 5. Small left pleural effusion. Electronically Signed   By: Jeannine Boga M.D.   On: 06/21/2017 04:54   Dg Chest Port 1 View  Result Date: 06/19/2017 CLINICAL DATA:  SOB/midline lower CP/upper abd pain x today, cough (productive) x this am, HTN, diabetic, past smoker - quit x 40 years ago EXAM: PORTABLE CHEST  1 VIEW COMPARISON:  Chest x-rays dated 12/16/2016 and 11/11/2016. Chest CT dated 05/09/2017. FINDINGS: Right-sided chest tube now in place with tip directed towards the medial aspects of the right upper lobe. Dense opacity at the right lung base, pleural effusion and/or airspace collapse. Suspect extension of pleural fluid to the right lung apex, versus hydropneumothorax. Additional lesser opacity at the left lung base, also most likely pleural effusion and/or atelectasis. Heart size and mediastinal contours appear stable. Left-sided PICC line in place with tip at the level of the upper SVC. IMPRESSION: 1. Dense opacity at the right lung base, most likely combination of pleural effusion and airspace collapse. Underlying pneumonia cannot be excluded. 2. Additional opacity at the right lung apex, most likely extension of pleural effusion, alternatively apical hydropneumothorax. Recommend short-term follow-up chest x-ray, either later today or in the morning, to ensure stability or resolution. 3. Additional opacity at the left lung base, most likely combination of atelectasis and small pleural effusion. 4. Right-sided chest tube in place. 5. Left-sided PICC line in place with tip at the level of the upper SVC. Electronically Signed   By: Franki Cabot M.D.   On: 06/19/2017 13:26       Subjective: No new complaints.    Discharge Exam: Vitals:   06/23/17 0516 06/23/17 1117  BP: 128/62 125/72  Pulse: 87 91  Resp: 18 17  Temp: 97.8 F (36.6 C) 97.9 F (36.6 C)   Vitals:   06/22/17 2031 06/23/17 0002 06/23/17 0516 06/23/17 1117  BP: 127/72 125/79 128/62 125/72  Pulse: 88 89 87 91  Resp: (!) '25 14 18 17  ' Temp: 98 F (36.7 C) 97.8 F (36.6 C) 97.8 F (36.6 C) 97.9 F (36.6 C)  TempSrc: Oral Oral Oral Oral  SpO2: 100% 98% 100% 100%  Weight:      Height:        General: Pt is alert, awake, not in acute distress Cardiovascular: RRR, S1/S2 +, no rubs, no gallops Respiratory: Diminished air entry at bases, no wheezing or rhonchi.  Abdominal: Soft, NT, ND, bowel sounds +surgical incisions over the abd healing well.  Extremities:bilateral pedal edema.     The results of significant diagnostics from this hospitalization (including imaging, microbiology, ancillary and laboratory) are listed below for reference.     Microbiology: Recent Results (from the past 240 hour(s))  MRSA PCR Screening     Status: None   Collection Time: 06/19/17  9:15 PM  Result Value Ref Range Status   MRSA by PCR NEGATIVE NEGATIVE Final    Comment:        The GeneXpert MRSA Assay (FDA approved for NASAL specimens only), is one component of a comprehensive MRSA colonization surveillance program. It is not intended to diagnose MRSA infection nor to guide or monitor treatment for MRSA infections.   C difficile quick scan w PCR reflex     Status: None   Collection Time: 06/21/17  5:49 PM  Result Value Ref Range Status   C Diff antigen NEGATIVE NEGATIVE Final   C Diff toxin NEGATIVE NEGATIVE Final   C Diff interpretation No C. difficile detected.  Final     Labs: BNP (last 3 results)  Recent Labs  06/19/17 1248  BNP 39.5   Basic Metabolic Panel:  Recent Labs Lab 06/19/17 1306 06/20/17 0752 06/21/17 1128  NA 131* 135 134*  K 3.6 3.1* 3.8  CL 97* 104 99*  CO2 '24 27 24  ' GLUCOSE 156* 133* 167*   BUN  '13 10 6  ' CREATININE 1.18* 0.98 1.18*  CALCIUM 8.3* 7.5* 8.3*   Liver Function Tests:  Recent Labs Lab 06/19/17 1306 06/20/17 0752  AST 107* 83*  ALT 47 38  ALKPHOS 825* 599*  BILITOT 1.1 0.8  PROT 7.0 5.5*  ALBUMIN 2.4* 1.9*   No results for input(s): LIPASE, AMYLASE in the last 168 hours. No results for input(s): AMMONIA in the last 168 hours. CBC:  Recent Labs Lab 06/19/17 1306 06/20/17 0752 06/21/17 0630  WBC 6.0 4.8 6.3  NEUTROABS 3.8  --   --   HGB 9.4* 7.3* 10.3*  HCT 27.8* 22.3* 31.2*  MCV 87.7 88.5 88.6  PLT 104* 109* 122*   Cardiac Enzymes: No results for input(s): CKTOTAL, CKMB, CKMBINDEX, TROPONINI in the last 168 hours. BNP: Invalid input(s): POCBNP CBG:  Recent Labs Lab 06/22/17 0744 06/22/17 1113 06/22/17 1622 06/22/17 2149 06/23/17 1116  GLUCAP 125* 178* 104* 137* 164*   D-Dimer No results for input(s): DDIMER in the last 72 hours. Hgb A1c No results for input(s): HGBA1C in the last 72 hours. Lipid Profile No results for input(s): CHOL, HDL, LDLCALC, TRIG, CHOLHDL, LDLDIRECT in the last 72 hours. Thyroid function studies No results for input(s): TSH, T4TOTAL, T3FREE, THYROIDAB in the last 72 hours.  Invalid input(s): FREET3 Anemia work up No results for input(s): VITAMINB12, FOLATE, FERRITIN, TIBC, IRON, RETICCTPCT in the last 72 hours. Urinalysis    Component Value Date/Time   COLORURINE YELLOW 03/03/2017 1149   APPEARANCEUR CLEAR 03/03/2017 1149   LABSPEC 1.009 03/03/2017 1149   LABSPEC 1.020 08/15/2009 1348   PHURINE 6.0 03/03/2017 1149   GLUCOSEU 2+ (A) 03/03/2017 1149   HGBUR NEGATIVE 03/03/2017 1149   BILIRUBINUR NEGATIVE 03/03/2017 1149   BILIRUBINUR Negative 08/15/2009 1348   KETONESUR NEGATIVE 03/03/2017 1149   PROTEINUR NEGATIVE 03/03/2017 1149   UROBILINOGEN 0.2 08/02/2014 1616   NITRITE NEGATIVE 03/03/2017 1149   LEUKOCYTESUR NEGATIVE 03/03/2017 1149   LEUKOCYTESUR Negative 08/15/2009 1348   Sepsis  Labs Invalid input(s): PROCALCITONIN,  WBC,  LACTICIDVEN Microbiology Recent Results (from the past 240 hour(s))  MRSA PCR Screening     Status: None   Collection Time: 06/19/17  9:15 PM  Result Value Ref Range Status   MRSA by PCR NEGATIVE NEGATIVE Final    Comment:        The GeneXpert MRSA Assay (FDA approved for NASAL specimens only), is one component of a comprehensive MRSA colonization surveillance program. It is not intended to diagnose MRSA infection nor to guide or monitor treatment for MRSA infections.   C difficile quick scan w PCR reflex     Status: None   Collection Time: 06/21/17  5:49 PM  Result Value Ref Range Status   C Diff antigen NEGATIVE NEGATIVE Final   C Diff toxin NEGATIVE NEGATIVE Final   C Diff interpretation No C. difficile detected.  Final     Time coordinating discharge: Over 30 minutes  SIGNED:   Hosie Poisson, MD  [facility] 06/23/2017, 12:51 PM Pager   If 7PM-7AM, please contact night-coverage [email or web address] Password [email or web address]

## 2017-06-24 ENCOUNTER — Other Ambulatory Visit: Payer: Self-pay | Admitting: Internal Medicine

## 2017-07-01 ENCOUNTER — Ambulatory Visit: Payer: 59 | Admitting: Podiatry

## 2017-07-06 ENCOUNTER — Encounter: Payer: Self-pay | Admitting: Internal Medicine

## 2017-07-06 ENCOUNTER — Other Ambulatory Visit: Payer: Self-pay | Admitting: Internal Medicine

## 2017-07-06 DIAGNOSIS — E876 Hypokalemia: Secondary | ICD-10-CM

## 2017-07-06 DIAGNOSIS — D649 Anemia, unspecified: Secondary | ICD-10-CM | POA: Insufficient documentation

## 2017-07-06 DIAGNOSIS — Z79899 Other long term (current) drug therapy: Secondary | ICD-10-CM

## 2017-07-16 ENCOUNTER — Ambulatory Visit: Payer: 59 | Admitting: Adult Health

## 2017-07-30 ENCOUNTER — Ambulatory Visit: Payer: Self-pay | Admitting: Internal Medicine

## 2017-07-31 ENCOUNTER — Other Ambulatory Visit: Payer: Self-pay | Admitting: Internal Medicine

## 2017-07-31 ENCOUNTER — Ambulatory Visit: Payer: 59 | Admitting: Podiatry

## 2017-08-10 ENCOUNTER — Other Ambulatory Visit: Payer: Self-pay | Admitting: Internal Medicine

## 2017-08-13 ENCOUNTER — Encounter: Payer: Self-pay | Admitting: Internal Medicine

## 2017-08-18 DEATH — deceased

## 2017-08-20 ENCOUNTER — Other Ambulatory Visit: Payer: Self-pay | Admitting: Internal Medicine

## 2017-09-04 ENCOUNTER — Ambulatory Visit: Payer: Self-pay | Admitting: Internal Medicine

## 2018-03-25 ENCOUNTER — Encounter: Payer: Self-pay | Admitting: Internal Medicine
